# Patient Record
Sex: Male | Born: 1945 | Race: Black or African American | Hispanic: No | Marital: Married | Smoking: Never smoker
Health system: Southern US, Community
[De-identification: ages and names within clinical notes are randomized; demographics above are authoritative.]

## PROBLEM LIST (undated history)

## (undated) DIAGNOSIS — K279 Peptic ulcer, site unspecified, unspecified as acute or chronic, without hemorrhage or perforation: Secondary | ICD-10-CM

## (undated) DIAGNOSIS — I10 Essential (primary) hypertension: Secondary | ICD-10-CM

## (undated) DIAGNOSIS — H40009 Preglaucoma, unspecified, unspecified eye: Secondary | ICD-10-CM

## (undated) DIAGNOSIS — R972 Elevated prostate specific antigen [PSA]: Secondary | ICD-10-CM

## (undated) DIAGNOSIS — E119 Type 2 diabetes mellitus without complications: Secondary | ICD-10-CM

## (undated) DIAGNOSIS — I4891 Unspecified atrial fibrillation: Secondary | ICD-10-CM

## (undated) DIAGNOSIS — K635 Polyp of colon: Secondary | ICD-10-CM

## (undated) DIAGNOSIS — H3530 Unspecified macular degeneration: Secondary | ICD-10-CM

## (undated) DIAGNOSIS — I639 Cerebral infarction, unspecified: Secondary | ICD-10-CM

## (undated) DIAGNOSIS — E785 Hyperlipidemia, unspecified: Secondary | ICD-10-CM

## (undated) DIAGNOSIS — N400 Enlarged prostate without lower urinary tract symptoms: Secondary | ICD-10-CM

## (undated) HISTORY — DX: Unspecified atrial fibrillation: I48.91

## (undated) HISTORY — DX: Hyperlipidemia, unspecified: E78.5

## (undated) HISTORY — PX: ABCESS DRAINAGE: SHX399

## (undated) HISTORY — DX: Benign prostatic hyperplasia without lower urinary tract symptoms: N40.0

## (undated) HISTORY — PX: PROSTATE BIOPSY: SHX241

## (undated) HISTORY — DX: Polyp of colon: K63.5

## (undated) HISTORY — DX: Elevated prostate specific antigen (PSA): R97.20

## (undated) HISTORY — DX: Essential (primary) hypertension: I10

## (undated) HISTORY — DX: Peptic ulcer, site unspecified, unspecified as acute or chronic, without hemorrhage or perforation: K27.9

## (undated) HISTORY — PX: HERNIA REPAIR: SHX51

## (undated) HISTORY — PX: POLYPECTOMY: SHX149

## (undated) HISTORY — DX: Type 2 diabetes mellitus without complications: E11.9

## (undated) HISTORY — DX: Unspecified macular degeneration: H35.30

## (undated) HISTORY — DX: Cerebral infarction, unspecified: I63.9

## (undated) HISTORY — DX: Preglaucoma, unspecified, unspecified eye: H40.009

---

## 1999-03-08 ENCOUNTER — Ambulatory Visit (HOSPITAL_BASED_OUTPATIENT_CLINIC_OR_DEPARTMENT_OTHER): Admission: RE | Admit: 1999-03-08 | Discharge: 1999-03-08 | Payer: Self-pay | Admitting: Orthopedic Surgery

## 1999-07-22 ENCOUNTER — Encounter: Payer: Self-pay | Admitting: Internal Medicine

## 1999-07-22 ENCOUNTER — Ambulatory Visit (HOSPITAL_COMMUNITY): Admission: RE | Admit: 1999-07-22 | Discharge: 1999-07-22 | Payer: Self-pay | Admitting: Internal Medicine

## 1999-11-12 ENCOUNTER — Encounter: Payer: Self-pay | Admitting: Urology

## 1999-11-12 ENCOUNTER — Ambulatory Visit (HOSPITAL_COMMUNITY): Admission: RE | Admit: 1999-11-12 | Discharge: 1999-11-12 | Payer: Self-pay | Admitting: Urology

## 2005-03-04 ENCOUNTER — Ambulatory Visit: Payer: Self-pay | Admitting: Internal Medicine

## 2005-03-11 ENCOUNTER — Ambulatory Visit: Payer: Self-pay | Admitting: Internal Medicine

## 2006-08-27 ENCOUNTER — Ambulatory Visit: Payer: Self-pay | Admitting: Internal Medicine

## 2006-08-27 LAB — CONVERTED CEMR LAB
ALT: 23 units/L (ref 0–40)
AST: 21 units/L (ref 0–37)
Albumin: 3.7 g/dL (ref 3.5–5.2)
Alkaline Phosphatase: 76 units/L (ref 39–117)
BUN: 16 mg/dL (ref 6–23)
Basophils Absolute: 0 10*3/uL (ref 0.0–0.1)
Basophils Relative: 0.2 % (ref 0.0–1.0)
Bilirubin Urine: NEGATIVE
Bilirubin, Direct: 0.2 mg/dL (ref 0.0–0.3)
CO2: 28 meq/L (ref 19–32)
Calcium: 9.3 mg/dL (ref 8.4–10.5)
Chloride: 105 meq/L (ref 96–112)
Cholesterol: 152 mg/dL (ref 0–200)
Creatinine, Ser: 0.3 mg/dL — ABNORMAL LOW (ref 0.4–1.5)
Eosinophils Absolute: 0.1 10*3/uL (ref 0.0–0.6)
Eosinophils Relative: 2 % (ref 0.0–5.0)
GFR calc Af Amer: 391 mL/min
GFR calc non Af Amer: 323 mL/min
Glucose, Bld: 120 mg/dL — ABNORMAL HIGH (ref 70–99)
HCT: 35.5 % — ABNORMAL LOW (ref 39.0–52.0)
HDL: 36 mg/dL — ABNORMAL LOW (ref >39.0)
Hemoglobin, Urine: NEGATIVE
Hemoglobin: 12.1 g/dL — ABNORMAL LOW (ref 13.0–17.0)
Ketones, ur: NEGATIVE mg/dL
LDL Cholesterol: 100 mg/dL — ABNORMAL HIGH (ref 0–99)
Leukocytes, UA: NEGATIVE
Lymphocytes Relative: 32 % (ref 12.0–46.0)
MCHC: 34.1 g/dL (ref 30.0–36.0)
MCV: 85.7 fL (ref 78.0–100.0)
Monocytes Absolute: 0.4 10*3/uL (ref 0.2–0.7)
Monocytes Relative: 7.3 % (ref 3.0–11.0)
Neutro Abs: 3.2 10*3/uL (ref 1.4–7.7)
Neutrophils Relative %: 58.5 % (ref 43.0–77.0)
Nitrite: NEGATIVE
PSA: 2.07 ng/mL (ref 0.10–4.00)
Platelets: 249 10*3/uL (ref 150–400)
Potassium: 4 meq/L (ref 3.5–5.1)
RBC: 4.14 M/uL — ABNORMAL LOW (ref 4.22–5.81)
RDW: 12.4 % (ref 11.5–14.6)
Sodium: 141 meq/L (ref 135–145)
Specific Gravity, Urine: 1.025 (ref 1.000–1.03)
TSH: 0.67 microintl units/mL (ref 0.35–5.50)
Total Bilirubin: 1 mg/dL (ref 0.3–1.2)
Total CHOL/HDL Ratio: 4.2
Total Protein, Urine: NEGATIVE mg/dL
Total Protein: 7 g/dL (ref 6.0–8.3)
Triglycerides: 80 mg/dL (ref 0–149)
Urine Glucose: NEGATIVE mg/dL
Urobilinogen, UA: 0.2 (ref 0.0–1.0)
VLDL: 16 mg/dL (ref 0–40)
WBC: 5.4 10*3/uL (ref 4.5–10.5)
pH: 6 (ref 5.0–8.0)

## 2006-09-03 ENCOUNTER — Ambulatory Visit: Payer: Self-pay | Admitting: Internal Medicine

## 2007-09-20 ENCOUNTER — Ambulatory Visit: Payer: Self-pay | Admitting: Internal Medicine

## 2007-09-20 DIAGNOSIS — H8309 Labyrinthitis, unspecified ear: Secondary | ICD-10-CM | POA: Insufficient documentation

## 2007-09-20 DIAGNOSIS — I10 Essential (primary) hypertension: Secondary | ICD-10-CM | POA: Insufficient documentation

## 2007-09-22 ENCOUNTER — Telehealth: Payer: Self-pay | Admitting: Internal Medicine

## 2007-10-06 ENCOUNTER — Encounter (HOSPITAL_BASED_OUTPATIENT_CLINIC_OR_DEPARTMENT_OTHER): Admission: RE | Admit: 2007-10-06 | Discharge: 2007-10-25 | Payer: Self-pay | Admitting: Surgery

## 2008-09-26 ENCOUNTER — Encounter (INDEPENDENT_AMBULATORY_CARE_PROVIDER_SITE_OTHER): Payer: Self-pay | Admitting: *Deleted

## 2008-12-14 ENCOUNTER — Ambulatory Visit: Payer: Self-pay | Admitting: Internal Medicine

## 2008-12-14 LAB — CONVERTED CEMR LAB
ALT: 30 units/L (ref 0–53)
AST: 26 units/L (ref 0–37)
Albumin: 3.8 g/dL (ref 3.5–5.2)
Alkaline Phosphatase: 65 units/L (ref 39–117)
BUN: 15 mg/dL (ref 6–23)
Basophils Absolute: 0.1 10*3/uL (ref 0.0–0.1)
Basophils Relative: 1.1 % (ref 0.0–3.0)
Bilirubin Urine: NEGATIVE
Bilirubin, Direct: 0.2 mg/dL (ref 0.0–0.3)
CO2: 28 meq/L (ref 19–32)
Calcium: 8.8 mg/dL (ref 8.4–10.5)
Chloride: 106 meq/L (ref 96–112)
Cholesterol: 132 mg/dL (ref 0–200)
Creatinine, Ser: 0.9 mg/dL (ref 0.4–1.5)
Eosinophils Absolute: 0.1 10*3/uL (ref 0.0–0.7)
Eosinophils Relative: 2.1 % (ref 0.0–5.0)
GFR calc non Af Amer: 109.15 mL/min (ref >60)
Glucose, Bld: 101 mg/dL — ABNORMAL HIGH (ref 70–99)
HCT: 42.2 % (ref 39.0–52.0)
HDL: 37.5 mg/dL — ABNORMAL LOW (ref >39.00)
Hemoglobin, Urine: NEGATIVE
Hemoglobin: 14.1 g/dL (ref 13.0–17.0)
Ketones, ur: NEGATIVE mg/dL
LDL Cholesterol: 80 mg/dL (ref 0–99)
Leukocytes, UA: NEGATIVE
Lymphocytes Relative: 34.8 % (ref 12.0–46.0)
Lymphs Abs: 2.3 10*3/uL (ref 0.7–4.0)
MCHC: 33.4 g/dL (ref 30.0–36.0)
MCV: 88.4 fL (ref 78.0–100.0)
Monocytes Absolute: 0.6 10*3/uL (ref 0.1–1.0)
Monocytes Relative: 8.7 % (ref 3.0–12.0)
Neutro Abs: 3.5 10*3/uL (ref 1.4–7.7)
Neutrophils Relative %: 53.3 % (ref 43.0–77.0)
Nitrite: NEGATIVE
PSA: 2.57 ng/mL (ref 0.10–4.00)
Platelets: 263 10*3/uL (ref 150.0–400.0)
Potassium: 4.6 meq/L (ref 3.5–5.1)
RBC: 4.78 M/uL (ref 4.22–5.81)
RDW: 12.9 % (ref 11.5–14.6)
Sodium: 143 meq/L (ref 135–145)
Specific Gravity, Urine: 1.02 (ref 1.000–1.030)
TSH: 0.57 microintl units/mL (ref 0.35–5.50)
Total Bilirubin: 1 mg/dL (ref 0.3–1.2)
Total CHOL/HDL Ratio: 4
Total Protein, Urine: NEGATIVE mg/dL
Total Protein: 6.7 g/dL (ref 6.0–8.3)
Triglycerides: 71 mg/dL (ref 0.0–149.0)
Urine Glucose: NEGATIVE mg/dL
Urobilinogen, UA: 0.2 (ref 0.0–1.0)
VLDL: 14.2 mg/dL (ref 0.0–40.0)
WBC: 6.6 10*3/uL (ref 4.5–10.5)
pH: 5.5 (ref 5.0–8.0)

## 2008-12-19 ENCOUNTER — Ambulatory Visit: Payer: Self-pay | Admitting: Internal Medicine

## 2009-05-24 ENCOUNTER — Telehealth: Payer: Self-pay | Admitting: Internal Medicine

## 2009-10-09 ENCOUNTER — Telehealth (INDEPENDENT_AMBULATORY_CARE_PROVIDER_SITE_OTHER): Payer: Self-pay | Admitting: *Deleted

## 2010-01-01 ENCOUNTER — Ambulatory Visit: Payer: Self-pay | Admitting: Internal Medicine

## 2010-01-01 LAB — CONVERTED CEMR LAB
ALT: 25 units/L (ref 0–53)
AST: 20 units/L (ref 0–37)
Albumin: 3.6 g/dL (ref 3.5–5.2)
Alkaline Phosphatase: 70 units/L (ref 39–117)
BUN: 14 mg/dL (ref 6–23)
Basophils Absolute: 0 10*3/uL (ref 0.0–0.1)
Basophils Relative: 0.2 % (ref 0.0–3.0)
Bilirubin Urine: NEGATIVE
Bilirubin, Direct: 0.2 mg/dL (ref 0.0–0.3)
CO2: 28 meq/L (ref 19–32)
Calcium: 8.8 mg/dL (ref 8.4–10.5)
Chloride: 107 meq/L (ref 96–112)
Cholesterol: 157 mg/dL (ref 0–200)
Creatinine, Ser: 0.8 mg/dL (ref 0.4–1.5)
Eosinophils Absolute: 0.1 10*3/uL (ref 0.0–0.7)
Eosinophils Relative: 0.7 % (ref 0.0–5.0)
GFR calc non Af Amer: 134.26 mL/min (ref >60)
Glucose, Bld: 107 mg/dL — ABNORMAL HIGH (ref 70–99)
HCT: 43.6 % (ref 39.0–52.0)
HDL: 47.6 mg/dL (ref >39.00)
Hemoglobin, Urine: NEGATIVE
Hemoglobin: 14.3 g/dL (ref 13.0–17.0)
Ketones, ur: NEGATIVE mg/dL
LDL Cholesterol: 97 mg/dL (ref 0–99)
Lymphocytes Relative: 11.2 % — ABNORMAL LOW (ref 12.0–46.0)
Lymphs Abs: 1.5 10*3/uL (ref 0.7–4.0)
MCHC: 32.8 g/dL (ref 30.0–36.0)
MCV: 89.4 fL (ref 78.0–100.0)
Monocytes Absolute: 1.1 10*3/uL — ABNORMAL HIGH (ref 0.1–1.0)
Monocytes Relative: 8.4 % (ref 3.0–12.0)
Neutro Abs: 10.6 10*3/uL — ABNORMAL HIGH (ref 1.4–7.7)
Neutrophils Relative %: 79.5 % — ABNORMAL HIGH (ref 43.0–77.0)
Nitrite: NEGATIVE
PSA: 3.81 ng/mL (ref 0.10–4.00)
Platelets: 252 10*3/uL (ref 150.0–400.0)
Potassium: 4.2 meq/L (ref 3.5–5.1)
RBC: 4.88 M/uL (ref 4.22–5.81)
RDW: 14.1 % (ref 11.5–14.6)
Sodium: 140 meq/L (ref 135–145)
Specific Gravity, Urine: 1.025 (ref 1.000–1.030)
TSH: 0.81 microintl units/mL (ref 0.35–5.50)
Total Bilirubin: 0.9 mg/dL (ref 0.3–1.2)
Total CHOL/HDL Ratio: 3
Total Protein, Urine: NEGATIVE mg/dL
Total Protein: 6.7 g/dL (ref 6.0–8.3)
Triglycerides: 61 mg/dL (ref 0.0–149.0)
Urine Glucose: NEGATIVE mg/dL
Urobilinogen, UA: 0.2 (ref 0.0–1.0)
VLDL: 12.2 mg/dL (ref 0.0–40.0)
WBC: 13.3 10*3/uL — ABNORMAL HIGH (ref 4.5–10.5)
pH: 6 (ref 5.0–8.0)

## 2010-01-03 ENCOUNTER — Ambulatory Visit: Payer: Self-pay | Admitting: Internal Medicine

## 2010-01-03 ENCOUNTER — Inpatient Hospital Stay (HOSPITAL_COMMUNITY): Admission: EM | Admit: 2010-01-03 | Discharge: 2010-01-06 | Payer: Self-pay | Admitting: Emergency Medicine

## 2010-01-03 DIAGNOSIS — E785 Hyperlipidemia, unspecified: Secondary | ICD-10-CM | POA: Insufficient documentation

## 2010-01-14 ENCOUNTER — Encounter: Payer: Self-pay | Admitting: Internal Medicine

## 2010-02-05 ENCOUNTER — Encounter: Payer: Self-pay | Admitting: Internal Medicine

## 2010-07-23 NOTE — Progress Notes (Signed)
Summary: Records request from [facility]  Request for records received from [facility]. Request forwarded to [facility]. Dena Chavis  October 09, 2009 10:37 AM

## 2010-07-23 NOTE — Letter (Signed)
Summary: [facility]   Imported By: Sherian Rein 02/26/2010 10:52:52  _____________________________________________________________________  External Attachment:    Type:   Image     Comment:   External Document
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
## 2010-07-23 NOTE — Letter (Signed)
Summary: [facility]   Imported By: Lester Dawn 02/05/2010 08:36:15  _____________________________________________________________________  External Attachment:    Type:   Image     Comment:   External Document

## 2010-09-07 LAB — CBC
HCT: 38.7 % — ABNORMAL LOW (ref 39.0–52.0)
Hemoglobin: 13.1 g/dL (ref 13.0–17.0)
MCH: 29.6 pg (ref 26.0–34.0)
MCHC: 33.8 g/dL (ref 30.0–36.0)
MCV: 87.6 fL (ref 78.0–100.0)
Platelets: 305 10*3/uL (ref 150–400)
RBC: 4.42 MIL/uL (ref 4.22–5.81)
RDW: 13.6 % (ref 11.5–15.5)
WBC: 15.9 10*3/uL — ABNORMAL HIGH (ref 4.0–10.5)

## 2010-09-07 LAB — BASIC METABOLIC PANEL
BUN: 11 mg/dL (ref 6–23)
CO2: 28 mEq/L (ref 19–32)
Calcium: 8.4 mg/dL (ref 8.4–10.5)
Chloride: 106 mEq/L (ref 96–112)
Creatinine, Ser: 0.95 mg/dL (ref 0.4–1.5)
GFR calc Af Amer: >60 mL/min (ref >60)
GFR calc non Af Amer: >60 mL/min (ref >60)
Glucose, Bld: 136 mg/dL — ABNORMAL HIGH (ref 70–99)
Potassium: 4 mEq/L (ref 3.5–5.1)
Sodium: 139 mEq/L (ref 135–145)

## 2010-09-08 LAB — URINALYSIS, ROUTINE W REFLEX MICROSCOPIC
Bilirubin Urine: NEGATIVE
Glucose, UA: NEGATIVE mg/dL
Hgb urine dipstick: NEGATIVE
Ketones, ur: NEGATIVE mg/dL
Nitrite: NEGATIVE
Protein, ur: NEGATIVE mg/dL
Specific Gravity, Urine: 1.022 (ref 1.005–1.030)
Urobilinogen, UA: 0.2 mg/dL (ref 0.0–1.0)
pH: 5 (ref 5.0–8.0)

## 2010-09-08 LAB — DIFFERENTIAL
Basophils Absolute: 0 10*3/uL (ref 0.0–0.1)
Basophils Relative: 0 % (ref 0–1)
Eosinophils Absolute: 0.1 10*3/uL (ref 0.0–0.7)
Eosinophils Relative: 1 % (ref 0–5)
Lymphocytes Relative: 16 % (ref 12–46)
Lymphs Abs: 1.6 10*3/uL (ref 0.7–4.0)
Monocytes Absolute: 0.7 10*3/uL (ref 0.1–1.0)
Monocytes Relative: 7 % (ref 3–12)
Neutro Abs: 7.7 10*3/uL (ref 1.7–7.7)
Neutrophils Relative %: 76 % (ref 43–77)

## 2010-09-08 LAB — CBC
HCT: 44.5 % (ref 39.0–52.0)
Hemoglobin: 15.2 g/dL (ref 13.0–17.0)
MCH: 29.8 pg (ref 26.0–34.0)
MCHC: 34.1 g/dL (ref 30.0–36.0)
MCV: 87.3 fL (ref 78.0–100.0)
Platelets: 314 10*3/uL (ref 150–400)
RBC: 5.1 MIL/uL (ref 4.22–5.81)
RDW: 13.6 % (ref 11.5–15.5)
WBC: 10.1 10*3/uL (ref 4.0–10.5)

## 2010-09-08 LAB — BASIC METABOLIC PANEL WITH GFR
BUN: 17 mg/dL (ref 6–23)
CO2: 26 meq/L (ref 19–32)
Calcium: 9.2 mg/dL (ref 8.4–10.5)
Chloride: 105 meq/L (ref 96–112)
Creatinine, Ser: 0.91 mg/dL (ref 0.4–1.5)
GFR calc Af Amer: >60 mL/min (ref >60)
GFR calc non Af Amer: >60 mL/min (ref >60)
Glucose, Bld: 111 mg/dL — ABNORMAL HIGH (ref 70–99)
Potassium: 3.8 meq/L (ref 3.5–5.1)
Sodium: 139 meq/L (ref 135–145)

## 2010-10-01 ENCOUNTER — Other Ambulatory Visit: Payer: Self-pay | Admitting: Internal Medicine
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
## 2010-11-08 NOTE — Assessment & Plan Note (Signed)
[facility]                           PRIMARY CARE OFFICE NOTE   MANAN, OLMO                      MRN:          [id number]  DATE:09/03/2006                            DOB:          Jan 02, 1946    [name] is a 65 year old [place] American gentleman presents for  followup evaluation and exam.  He was last seen in the office on  March 11, 2005.  In the interval, the patient reports he is actually  feeling well and doing well.   PAST MEDICAL HISTORY:   SURGICAL:  1. Abdominal abscess surgically drained.  2. Neck lesion excised and drained.  3. Adult circumcision.   MEDICAL ILLNESSES:  1. Chicken Pox as a child.  2. GC in the past.  3. Hyperlipidemia.  4. Hypertension.  5. 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  6. The patient had a painful ganglion cyst removed from A2 pulley left      small finger.   CURRENT MEDICATIONS:  1. Lipitor 20 mg daily.  2. Aspirin 325 mg daily.  3. Ziac 2.5/6.25, one daily.  4. Viagra 50 mg p.r.n.   FAMILY HISTORY:  Father died age 28 of MI.  Brother with CAD.  Sister  with cancer.  Positive family history for diabetes and heart disease.   SOCIAL HISTORY:  The patient has been married for 38 years.  He has two  daughters.  He had a son who has passed.  He has four grandchildren, 3  girls, 1 boy.  The patient continues to work at [employer] with 30+ years  of service.  He reports his marriage is in good health.   CHART REVIEW:  1. Last colonoscopy Oct 30, 2003, which was unremarkable with followup      in 2010.  2. The patient had a 2D echo in 1997, which was normal with an EF of      60%.  3. The patient had a painful ganglion cyst removed from A2 pulley left      small finger.  4. The patient had an MRI of the lumbar spine on July 22, 1999,      which showed focal disk protrusion at L5-L6 central and to the left      without definite nerve root compression.   REVIEW OF SYSTEMS:   The patient has had a 7-pound weight loss in 18  months.  It has been more than two years since he has had an eye exam.  No ENT complaints.  No cardiovascular complaints or respiratory  complaints.  The patient has occasional heartburn treated with Tums.  The patient has mild prostatism with decreased force of stream and slow  to start with nocturia, 0-1.  MSK:  Significant for ongoing pain in his  left shoulder with aches and discomfort but actually good range of  motion.  The patient is complaining of burning in his left leg at night  which does disrupt his sleep.  He also complains of some weakness in  both hands with grip strength with actually loss of strength after  prolonged holding.  PHYSICAL EXAMINATION:  VITAL SIGNS:  Temperature was 96.9, blood  pressure 138/83, pulse 69, weight 201.  GENERAL APPEARANCE:  A well nourished, athletic-appearing gentleman  looking younger than his stated chronologic age in no acute distress.  HEENT:  Normocephalic atraumatic.  EACs and TMs were unremarkable.  Oropharynx revealed the patient to be missing several teeth.  No buccal  lesions were noted.  His palate was normal.  Posterior pharynx was  clear.  Conjunctivae and sclerae were clear.  PERRLA.  EOMI.  Funduscopic exam was unremarkable.  NECK:  Supple without thyromegaly.  NODES:  No adenopathy was noted in the cervical, supraclavicular  regions.  CHEST:  No CVA tenderness.  LUNGS:  Clear to auscultation and percussion.  CARDIOVASCULAR:  With 2+ peripheral pulses, no JVD or carotid bruits.  He he denies a quiet precordium with a regular rate and rhythm without  murmurs, rubs, or gallops.  ABDOMEN:  Soft, no guarding or rebound, no organomegaly or splenomegaly  was noted.  He does have an umbilical hernia which is soft and easily  reducible.  GENITALIA:  Normal male phallus.  Bilaterally descended testicles  without masses.  RECTAL:  Revealed normal sphincter tone.  He has a 2+ to 3+  prostate  that is smooth with no nodules or abnormalities.  EXTREMITIES:  Without clubbing, cyanosis, edema, or deformity.  NEUROLOGIC:  Nonfocal.   LABORATORY:  From August 27, 2006, hemoglobin 12.1 grams, white count was  5,400 with a normal differential.  Chemistries revealed a glucose of  120.  Electrolytes were normal.  Kidney function normal with a  creatinine of 0.3.  A GFR of 391.  Liver functions were normal.  Cholesterol 152, triglycerides were 80, HDL was 36, LDL 100.  TSH normal  at 0.67.  PSA was normal at 2.07, down from 2.29 last year.  Urinalysis  was negative.   ASSESSMENT/PLAN:  1. Hypertension.  The patient with excellent control of his blood      pressure.  He will continue his present medications and refill      prescriptions, forwarded electronically.  2. Lipids.  The patient has had a good response to Lipitor with an LDL      at goal.  He will continue on his present medications and      prescriptions filed electronically.  3. Hyperglycemia.  Reviewing the patient's chart, he has had a      progressively elevated glucose, going from normal to 116, to 127,      now at 120.  I discussed this with the patient as early diabetes.      He is provided with the care notes handout on adult onset      diabetes as well as a carbohydrate counting diet.  Plan, the      patient is to work on diet management in regards to bringing his      blood sugar under better control.  Would get followup lab in one      year.  4. Peripheral neuropathy.  The patient is having neuropathic type pain      in his left lower extremity at night.  Plan is a trial of Elavil 25      mg at night, prescription is provided.  5. Health maintenance.  The patient is currently up to date with      colorectal cancer screening and prostate evaluation.  The patient      did have a 12-lead electrocardiogram which revealed the patient  to     have a normal sinus rhythm, question of minimal voltage criteria in       the lateral leads for LVH but otherwise unremarkable.   In summary, this is a very pleasant gentleman who is medically stable.  We did review blood sugar in detail.  I do want him to follow the diet  closely to bring this under tight control.     Rosalyn Gess Norins, MD  Electronically Signed    MEN/MedQ  DD: 09/03/2006  DT: 09/04/2006  Job #: [id number]   cc:   Marlowe Kays, Mr.

## 2010-11-29 ENCOUNTER — Other Ambulatory Visit: Payer: Self-pay | Admitting: Internal Medicine

## 2011-01-03 ENCOUNTER — Other Ambulatory Visit: Payer: Self-pay

## 2011-01-03 ENCOUNTER — Other Ambulatory Visit: Payer: Self-pay | Admitting: Internal Medicine

## 2011-01-03 DIAGNOSIS — Z0389 Encounter for observation for other suspected diseases and conditions ruled out: Secondary | ICD-10-CM

## 2011-01-03 DIAGNOSIS — Z0000 Encounter for general adult medical examination without abnormal findings: Secondary | ICD-10-CM

## 2011-01-06 ENCOUNTER — Encounter: Payer: Self-pay | Admitting: Internal Medicine

## 2011-01-17 ENCOUNTER — Other Ambulatory Visit (INDEPENDENT_AMBULATORY_CARE_PROVIDER_SITE_OTHER): Payer: Self-pay

## 2011-01-17 DIAGNOSIS — Z0389 Encounter for observation for other suspected diseases and conditions ruled out: Secondary | ICD-10-CM

## 2011-01-17 DIAGNOSIS — Z0000 Encounter for general adult medical examination without abnormal findings: Secondary | ICD-10-CM

## 2011-01-17 LAB — HEPATIC FUNCTION PANEL
ALT: 38 U/L (ref 0–53)
AST: 31 U/L (ref 0–37)
Albumin: 4.2 g/dL (ref 3.5–5.2)
Alkaline Phosphatase: 89 U/L (ref 39–117)
Bilirubin, Direct: 0.2 mg/dL (ref 0.0–0.3)
Total Bilirubin: 0.8 mg/dL (ref 0.3–1.2)
Total Protein: 7.3 g/dL (ref 6.0–8.3)

## 2011-01-17 LAB — CBC WITH DIFFERENTIAL/PLATELET
Basophils Absolute: 0 K/uL (ref 0.0–0.1)
Basophils Relative: 0.4 % (ref 0.0–3.0)
Eosinophils Absolute: 0.2 K/uL (ref 0.0–0.7)
Eosinophils Relative: 2.3 % (ref 0.0–5.0)
HCT: 44.7 % (ref 39.0–52.0)
Hemoglobin: 14.6 g/dL (ref 13.0–17.0)
Lymphocytes Relative: 31.3 % (ref 12.0–46.0)
Lymphs Abs: 2.2 K/uL (ref 0.7–4.0)
MCHC: 32.7 g/dL (ref 30.0–36.0)
MCV: 87.1 fl (ref 78.0–100.0)
Monocytes Absolute: 0.6 K/uL (ref 0.1–1.0)
Monocytes Relative: 9.1 % (ref 3.0–12.0)
Neutro Abs: 4 K/uL (ref 1.4–7.7)
Neutrophils Relative %: 56.9 % (ref 43.0–77.0)
Platelets: 258 K/uL (ref 150.0–400.0)
RBC: 5.13 Mil/uL (ref 4.22–5.81)
RDW: 13.8 % (ref 11.5–14.6)
WBC: 7.1 K/uL (ref 4.5–10.5)

## 2011-01-17 LAB — TSH: TSH: 0.88 u[IU]/mL (ref 0.35–5.50)

## 2011-01-17 LAB — URINALYSIS
Bilirubin Urine: NEGATIVE
Hgb urine dipstick: NEGATIVE
Ketones, ur: NEGATIVE
Leukocytes, UA: NEGATIVE
Nitrite: NEGATIVE
Specific Gravity, Urine: 1.015 (ref 1.000–1.030)
Total Protein, Urine: NEGATIVE
Urine Glucose: NEGATIVE
Urobilinogen, UA: 0.2 (ref 0.0–1.0)
pH: 5.5 (ref 5.0–8.0)

## 2011-01-17 LAB — LIPID PANEL
Cholesterol: 160 mg/dL (ref 0–200)
HDL: 40.8 mg/dL (ref >39.00)
LDL Cholesterol: 100 mg/dL — ABNORMAL HIGH (ref 0–99)
Total CHOL/HDL Ratio: 4
Triglycerides: 96 mg/dL (ref 0.0–149.0)
VLDL: 19.2 mg/dL (ref 0.0–40.0)

## 2011-01-17 LAB — BASIC METABOLIC PANEL
BUN: 16 mg/dL (ref 6–23)
CO2: 26 mEq/L (ref 19–32)
Calcium: 9 mg/dL (ref 8.4–10.5)
Chloride: 106 mEq/L (ref 96–112)
Creatinine, Ser: 0.8 mg/dL (ref 0.4–1.5)
GFR: 120.74 mL/min (ref >60.00)
Glucose, Bld: 134 mg/dL — ABNORMAL HIGH (ref 70–99)
Potassium: 4.5 mEq/L (ref 3.5–5.1)
Sodium: 141 mEq/L (ref 135–145)

## 2011-01-17 LAB — PSA: PSA: 3.14 ng/mL (ref 0.10–4.00)

## 2011-01-20 ENCOUNTER — Encounter: Payer: Self-pay | Admitting: Internal Medicine

## 2011-01-22 ENCOUNTER — Ambulatory Visit (INDEPENDENT_AMBULATORY_CARE_PROVIDER_SITE_OTHER): Payer: BC Managed Care – PPO | Admitting: Internal Medicine

## 2011-01-22 ENCOUNTER — Encounter: Payer: Self-pay | Admitting: Internal Medicine

## 2011-01-22 VITALS — BP 110/84 | HR 67 | Temp 97.9°F | Ht 71.0 in | Wt 209.0 lb

## 2011-01-22 DIAGNOSIS — I10 Essential (primary) hypertension: Secondary | ICD-10-CM

## 2011-01-22 DIAGNOSIS — Z0000 Encounter for general adult medical examination without abnormal findings: Secondary | ICD-10-CM

## 2011-01-22 DIAGNOSIS — Z136 Encounter for screening for cardiovascular disorders: Secondary | ICD-10-CM

## 2011-01-22 DIAGNOSIS — E785 Hyperlipidemia, unspecified: Secondary | ICD-10-CM

## 2011-01-22 MED ORDER — SILDENAFIL CITRATE 100 MG PO TABS: 100.0000 mg | ORAL_TABLET | Freq: Every day | ORAL | Status: DC | PRN

## 2011-01-22 MED ORDER — ATORVASTATIN CALCIUM 20 MG PO TABS: 20.0000 mg | ORAL_TABLET | Freq: Every day | ORAL | Status: DC

## 2011-01-22 MED ORDER — BISOPROLOL-HYDROCHLOROTHIAZIDE 5-6.25 MG PO TABS: 1.0000 | ORAL_TABLET | Freq: Every day | ORAL | Status: DC

## 2011-01-22 NOTE — Progress Notes (Signed)
Subjective:    Patient ID: Marcus Beasley, male    DOB: 1945/10/22, 65 y.o.   MRN: [id number]  HPI  The patient is here for annual Medicare wellness examination and management of other chronic and acute problems. Feeling pretty good.      The risk factors are reflected in the social history.  The roster of all physicians providing medical care to patient - is listed in the Snapshot section of the chart.  Activities of daily living:  The patient is 100% inedpendent in all ADLs: dressing, toileting, feeding as well as independent mobility  Home safety : The patient has smoke detectors in the home. They wear seatbelts. No firearms at home. There is no violence in the home. No falls.  There is no risks for hepatitis, STDs or HIV. There is no   history of blood transfusion. They have no travel history to infectious disease endemic areas of the world.  The patient has seen their dentist in the last six month. They have not seen their eye doctor in the last year. They deny  any hearing difficulty and have not had audiologic testing in the last year.  They do not  have excessive sun exposure. Discussed the need for sun protection: hats, long sleeves and use of sunscreen if there is significant sun exposure.   Diet: the importance of a healthy diet is discussed. They do not  have a healthy die-frequent fast food meals.  The patient does not have a regular exercise program.  The benefits of regular aerobic exercise were discussed.  Depression screen: there are no signs or vegative symptoms of depression- irritability, change in appetite, anhedonia, sadness/tearfullness.  Cognitive assessment: the patient manages all their financial and personal affairs and is actively engaged. They could relate day,date,year and events; recalled 2/3 objects at 3 minutes; performed clock-face test normally.  The following portions of the patient's history were reviewed and updated as appropriate: allergies, current  medications, past family history, past medical history,  past surgical history, past social history  and problem list.  Vision, hearing, body mass index were assessed and reviewed.   During the course of the visit the patient was educated and counseled about appropriate screening and preventive services including : fall prevention , diabetes screening, nutrition counseling, colorectal cancer screening, and recommended immunizations.  Review of Systems  Constitutional:  Negative for fever, chills, activity change and unexpected weight change.  HEENT:  Negative for hearing loss, ear pain, congestion, neck stiffness and postnasal drip. Negative for sore throat or swallowing problems. Negative for dental complaints.   Eyes: Negative for vision loss or change in visual acuity.  Respiratory: Negative for chest tightness and wheezing.   Cardiovascular: Negative for chest pain and palpitation. No decreased exercise tolerance Gastrointestinal: No change in bowel habit. No bloating or gas. No reflux or indigestion on a regular basis. Genitourinary: Negative for urgency, frequency, flank pain and difficulty urinating.  Musculoskeletal: Negative for myalgias, back pain, arthralgias and gait problem. Except for left shoulder pain intermittently Neurological: Negative for dizziness, tremors, weakness and headaches.  Hematological: Negative for adenopathy.  Psychiatric/Behavioral: Negative for behavioral problems and dysphoric mood.   Lab Results  Component Value Date   WBC 7.1 01/17/2011   HGB 14.6 01/17/2011   HCT 44.7 01/17/2011   PLT 258.0 01/17/2011   CHOL 160 01/17/2011   TRIG 96.0 01/17/2011   HDL 40.80 01/17/2011   ALT 38 01/17/2011   AST 31 01/17/2011   NA 141 01/17/2011  K 4.5 01/17/2011   CL 106 01/17/2011   CREATININE 0.8 01/17/2011   BUN 16 01/17/2011   CO2 26 01/17/2011   TSH 0.88 01/17/2011   PSA 3.14 01/17/2011   Lab Results  Component Value Date   LDLCALC 100* 01/17/2011          Review of Systems     Objective:   Physical Exam Vital signs reviewed Gen'l: Well nourished well developed AA male in no acute distress  HEENT:  Head: Normocephalic and atraumatic.  Right Ear: External ear normal. EAC/TM nl Left Ear: External ear normal.  EAC/TM nl Nose: Nose normal.  Mouth/Throat: Oropharynx is clear and moist. Dentition - native, in good repair. No buccal or palatal lesions. Posterior pharynx clear. Eyes: Conjunctivae and sclera clear. EOM intact. Pupils are equal, round, and reactive to light. Right eye exhibits no discharge. Left eye exhibits no discharge. Neck: Normal range of motion. Neck supple. No JVD present. No tracheal deviation present. No thyromegaly present.  Cardiovascular: Normal rate, regular rhythm, no gallop, no friction rub, no murmur heard.      Quiet precordium. 2+ radial and DP pulses . No carotid bruits Pulmonary/Chest: Effort normal. No respiratory distress or increased WOB, no wheezes, no rales. No chest wall deformity or CVAT. Abdominal: Soft. Bowel sounds are normal in all quadrants. He exhibits no distension, no tenderness, no rebound or guarding, No heptosplenomegaly  Genitourinary:  deferred Musculoskeletal: Normal range of motion. He exhibits no edema and no tenderness.       Small and large joints without redness, synovial thickening or deformity. Full range of motion preserved about all small, median and large joints.  Lymphadenopathy:    He has no cervical or supraclavicular adenopathy.  Neurological: He is alert and oriented to person, place, and time. CN II-XII intact. DTRs 2+ and symmetrical biceps, radial and patellar tendons. Cerebellar function normal with no tremor, rigidity, normal gait and station.  Skin: Skin is warm and dry. No rash noted. No erythema.  Psychiatric: He has a normal mood and affect. His behavior is normal. Thought content normal.         Assessment & Plan:

## 2011-01-24 DIAGNOSIS — Z0000 Encounter for general adult medical examination without abnormal findings: Secondary | ICD-10-CM | POA: Insufficient documentation

## 2011-01-24 NOTE — Assessment & Plan Note (Signed)
Good control on present medical regimen.  Plan - continue meds  - no change

## 2011-01-24 NOTE — Assessment & Plan Note (Signed)
BP Readings from Last 3 Encounters:  01/22/11 110/84  01/03/10 122/70  12/19/08 130/84   Good control  Plan - continue present medications.

## 2011-01-25 NOTE — Assessment & Plan Note (Signed)
Unremarkable history. Physical exam is normal. Lab results are in normal limits. He is current with colorectal cancer screeenig with last study in Aug '05. Immunizations: Tetanus Marhc '09, Pneumonia vaccine July '11. He is a candidate for shingles vaccine. 12 lead EKG without evidence of ischemia or injury. PSA is normal.  In summary - a very nice man who appears to be medically stable. He is encouraged to adopt a regular aerobic exercise program - 30 minutes of exericse that raises the heart rate to 180% of resting and to work on [employer] management by smart food choices, portion size control and exercise. Target weight is 190 lbs. Goal is to loose

## 2011-05-01 ENCOUNTER — Telehealth: Payer: Self-pay | Admitting: *Deleted

## 2011-05-01 NOTE — Telephone Encounter (Signed)
Refill request from [facility] [place] for meclizine, please Advise

## 2011-05-02 MED ORDER — MECLIZINE HCL 25 MG PO TABS: ORAL_TABLET | ORAL | Status: DC

## 2011-05-02 NOTE — Telephone Encounter (Signed)
Called: spoke w/ wife - antivert called in.

## 2012-03-10 ENCOUNTER — Other Ambulatory Visit: Payer: Self-pay | Admitting: Internal Medicine

## 2012-04-15 ENCOUNTER — Encounter: Payer: Self-pay | Admitting: Internal Medicine

## 2012-04-15 ENCOUNTER — Ambulatory Visit (INDEPENDENT_AMBULATORY_CARE_PROVIDER_SITE_OTHER): Payer: BC Managed Care – PPO | Admitting: Internal Medicine

## 2012-04-15 ENCOUNTER — Other Ambulatory Visit (INDEPENDENT_AMBULATORY_CARE_PROVIDER_SITE_OTHER): Payer: BC Managed Care – PPO

## 2012-04-15 VITALS — BP 142/88 | HR 71 | Temp 97.6°F | Resp 16 | Wt 201.0 lb

## 2012-04-15 DIAGNOSIS — R972 Elevated prostate specific antigen [PSA]: Secondary | ICD-10-CM

## 2012-04-15 DIAGNOSIS — E785 Hyperlipidemia, unspecified: Secondary | ICD-10-CM

## 2012-04-15 DIAGNOSIS — Z0000 Encounter for general adult medical examination without abnormal findings: Secondary | ICD-10-CM

## 2012-04-15 DIAGNOSIS — I10 Essential (primary) hypertension: Secondary | ICD-10-CM

## 2012-04-15 MED ORDER — SILDENAFIL CITRATE 100 MG PO TABS: 100.0000 mg | ORAL_TABLET | Freq: Every day | ORAL | Status: DC | PRN

## 2012-04-15 MED ORDER — BISOPROLOL-HYDROCHLOROTHIAZIDE 5-6.25 MG PO TABS: 1.0000 | ORAL_TABLET | Freq: Every day | ORAL | Status: DC

## 2012-04-15 MED ORDER — MECLIZINE HCL 25 MG PO TABS: ORAL_TABLET | ORAL | Status: DC

## 2012-04-15 MED ORDER — ATORVASTATIN CALCIUM 20 MG PO TABS: 20.0000 mg | ORAL_TABLET | Freq: Every day | ORAL | Status: DC

## 2012-04-15 NOTE — Progress Notes (Signed)
Subjective:    Patient ID: Marcus Beasley, male    DOB: 1945-07-20, 66 y.o.   MRN: [id number]  HPI The patient is here for annual Medicare wellness examination and management of other chronic and acute problems.   The risk factors are reflected in the social history.  The roster of all physicians providing medical care to patient - is listed in the Snapshot section of the chart.  Activities of daily living:  The patient is 100% inedpendent in all ADLs: dressing, toileting, feeding as well as independent mobility  Home safety : The patient has smoke detectors in the home. Fall- no falls. Home is fall safe. They wear seatbelts. No firearms at home  There is no risks for hepatitis, STDs or HIV. There is no   history of blood transfusion. They have no travel history to infectious disease endemic areas of the world.  The patient has seen their dentist in the last six month. They have not seen their eye doctor in the last year. They deny (admit to) any hearing difficulty and have had audiologic testing in the last year.    They do not  have excessive sun exposure. Discussed the need for sun protection: hats, long sleeves and use of sunscreen if there is significant sun exposure.   Diet: the importance of a healthy diet is discussed. They do have a healthy diet.  The patient has a regular exercise program.  The benefits of regular aerobic exercise were discussed.  Depression screen: there are no signs or vegative symptoms of depression- irritability, change in appetite, anhedonia, sadness/tearfullness.  Cognitive assessment: the patient manages all their financial and personal affairs and is actively engaged.   Vision, hearing, body mass index were assessed and reviewed.   During the course of the visit the patient was educated and counseled about appropriate screening and preventive services including : fall prevention , diabetes screening, nutrition counseling, colorectal cancer screening,  and recommended immunizations.  Past Medical History  Diagnosis Date  . Labyrinthitis, unspecified   . Unspecified essential hypertension    Past Surgical History  Procedure Date  . Abcess drainage     abdomen- 26 day hospitalization 1968  . Hernia repair summer '11    umbilical   Family History  Problem Relation Age of Onset  . Leukemia Mother   . Coronary artery disease Father   . Heart attack Father   . Diabetes Maternal Grandmother   . Heart disease Sister   . Cancer Sister   . Heart disease Brother   . Kidney disease Brother     HD   History   Social History  . Marital Status: Married    Spouse Name: N/A    Number of Children: 3  . Years of Education: 14   Occupational History  . [employer] maintenance 1968    Social History Main Topics  . Smoking status: Never Smoker   . Smokeless tobacco: Never Used  . Alcohol Use: 0.5 oz/week    1 drink(s) per week     beer  . Drug Use: No  . Sexually Active: Yes -- Male partner(s)   Other Topics Concern  . 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 2 dtrs , 1 son - homicide. 5 grandchildren. 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    Current Outpatient Prescriptions on File Prior to Visit  Medication Sig Dispense Refill  . aspirin  325 MG tablet Take 325 mg by mouth daily.        Marland Kitchen atorvastatin (LIPITOR) 20 MG tablet TAKE 1 TABLET (20 MG TOTAL) BY MOUTH DAILY.  90 tablet  0  . bisoprolol-hydrochlorothiazide (ZIAC) 5-6.25 MG per tablet TAKE 1 TABLET BY MOUTH DAILY.  90 tablet  0  . diphenoxylate-atropine (LOMOTIL) 2.5-0.025 MG per tablet Take 1 tablet by mouth 4 (four) times daily as needed.        . meclizine (ANTIVERT) 25 MG tablet 1/2 or 1 tab every 6 hours for dizziness  90 tablet  1  . sildenafil (VIAGRA) 100 MG tablet Take 1 tablet (100 mg total) by mouth daily as needed.  5 tablet  4       Review of Systems Constitutional:  Negative for fever,  chills, activity change and unexpected weight change.  HEENT:  Negative for hearing loss, ear pain, congestion, neck stiffness and postnasal drip. Negative for sore throat or swallowing problems. Negative for dental complaints.   Eyes: Negative for vision loss or change in visual acuity.  Respiratory: Negative for chest tightness and wheezing. Negative for DOE.   Cardiovascular: Negative for chest pain or palpitations. No decreased exercise tolerance Gastrointestinal: No change in bowel habit. No bloating or gas. No reflux or indigestion Genitourinary: Negative for urgency, frequency, flank pain and difficulty urinating. ED Musculoskeletal: Negative for myalgias, back pain, arthralgias and gait problem.  Neurological: Negative for tremors, weakness and headaches. Occasional dizziness Hematological: Negative for adenopathy.  Psychiatric/Behavioral: Negative for behavioral problems and dysphoric mood.       Objective:   Physical Exam Filed Vitals:   04/15/12 1443  BP: 142/88  Pulse: 71  Temp: 97.6 F (36.4 C)  Resp: 16   Wt Readings from Last 3 Encounters:  04/15/12 201 lb (91.173 kg)  01/22/11 209 lb (94.802 kg)  01/03/10 197 lb (89.359 kg)   Gen'l: Well nourished well developed AA male in no acute distress  HEENT: Head: Normocephalic and atraumatic. Right Ear: External ear normal. EAC/TM nl. Left Ear: External ear normal.  EAC/TM nl. Nose: Nose normal. Mouth/Throat: Oropharynx is clear and moist. . No buccal or palatal lesions. Posterior pharynx clear. Eyes: Conjunctivae and sclera clear. EOM intact. Pupils are equal, round, and reactive to light. Right eye exhibits no discharge. Left eye exhibits no discharge. Neck: Normal range of motion. Neck supple. No JVD present. No tracheal deviation present. No thyromegaly present.  Cardiovascular: Normal rate, regular rhythm, no gallop, no friction rub, no murmur heard.      Quiet precordium. 2+ radial and DP pulses . No carotid  bruits Pulmonary/Chest: Effort normal. No respiratory distress or increased WOB, no wheezes, no rales. No chest wall deformity or CVAT. Abdomen: Soft. Bowel sounds are normal in all quadrants. He exhibits no distension, no tenderness, no rebound or guarding, No heptosplenomegaly. Palpable hernia defect supraumbilical region, non-tender  Genitourinary:  deferred Musculoskeletal: Normal range of motion. He exhibits no edema and no tenderness.       Small and large joints without redness, synovial thickening or deformity. Full range of motion preserved about all small, median and large joints.  Lymphadenopathy:    He has no cervical or supraclavicular adenopathy.  Neurological: He is alert and oriented to person, place, and time. CN II-XII intact. DTRs 2+ and symmetrical biceps, radial and patellar tendons. Cerebellar function normal with no tremor, rigidity, normal gait and station.  Skin: Skin is warm and dry. No rash noted. No erythema. Large skin  tag left lower back. Small keloid scars on anterior chest. Psychiatric: He has a normal mood and affect. His behavior is normal. Thought content normal.   Lab Results  Component Value Date   WBC 7.1 01/17/2011   HGB 14.6 01/17/2011   HCT 44.7 01/17/2011   PLT 258.0 01/17/2011   GLUCOSE 116* 04/15/2012   CHOL 167 04/15/2012   TRIG 88.0 04/15/2012   HDL 40.30 04/15/2012   LDLCALC 109* 04/15/2012   ALT 24 04/15/2012   ALT 24 04/15/2012   AST 26 04/15/2012   AST 26 04/15/2012   NA 139 04/15/2012   K 3.9 04/15/2012   CL 105 04/15/2012   CREATININE 1.0 04/15/2012   BUN 15 04/15/2012   CO2 27 04/15/2012   TSH 0.88 01/17/2011   PSA 4.49* 04/15/2012         Assessment & Plan:

## 2012-04-15 NOTE — Patient Instructions (Addendum)
Thanks for for coming to see me.  Watch the belly - there is a hernia defect at the belly-button - call if that pushes out or gets tender.  Full report to follow with the lab results

## 2012-04-16 LAB — LIPID PANEL
Cholesterol: 167 mg/dL (ref 0–200)
HDL: 40.3 mg/dL (ref >39.00)
LDL Cholesterol: 109 mg/dL — ABNORMAL HIGH (ref 0–99)
Total CHOL/HDL Ratio: 4
Triglycerides: 88 mg/dL (ref 0.0–149.0)
VLDL: 17.6 mg/dL (ref 0.0–40.0)

## 2012-04-16 LAB — COMPREHENSIVE METABOLIC PANEL
ALT: 24 U/L (ref 0–53)
AST: 26 U/L (ref 0–37)
Albumin: 3.9 g/dL (ref 3.5–5.2)
Alkaline Phosphatase: 84 U/L (ref 39–117)
BUN: 15 mg/dL (ref 6–23)
CO2: 27 mEq/L (ref 19–32)
Calcium: 9.2 mg/dL (ref 8.4–10.5)
Chloride: 105 mEq/L (ref 96–112)
Creatinine, Ser: 1 mg/dL (ref 0.4–1.5)
GFR: 100.28 mL/min (ref >60.00)
Glucose, Bld: 116 mg/dL — ABNORMAL HIGH (ref 70–99)
Potassium: 3.9 mEq/L (ref 3.5–5.1)
Sodium: 139 mEq/L (ref 135–145)
Total Bilirubin: 0.7 mg/dL (ref 0.3–1.2)
Total Protein: 7.2 g/dL (ref 6.0–8.3)

## 2012-04-16 LAB — HEPATIC FUNCTION PANEL
ALT: 24 U/L (ref 0–53)
AST: 26 U/L (ref 0–37)
Albumin: 3.9 g/dL (ref 3.5–5.2)
Alkaline Phosphatase: 84 U/L (ref 39–117)
Bilirubin, Direct: 0.1 mg/dL (ref 0.0–0.3)
Total Bilirubin: 0.7 mg/dL (ref 0.3–1.2)
Total Protein: 7.2 g/dL (ref 6.0–8.3)

## 2012-04-16 LAB — PSA: PSA: 4.49 ng/mL — ABNORMAL HIGH (ref 0.10–4.00)

## 2012-04-19 DIAGNOSIS — R972 Elevated prostate specific antigen [PSA]: Secondary | ICD-10-CM | POA: Insufficient documentation

## 2012-04-19 NOTE — Assessment & Plan Note (Signed)
LDL is well controlled. Liver functions are normal.  Plan Continue present medications.

## 2012-04-19 NOTE — Assessment & Plan Note (Signed)
March '08 June '10 July '11 July '12 October '13 PSA    2.07    2.57    3.81  3.14    4.49  Previous year's pattern of slight increase in PSA with a larger jump from July '12 to now.  Plan- Either repeat in 6 months or refer to Urology. Will consult with patient.

## 2012-04-19 NOTE — Assessment & Plan Note (Signed)
BP Readings from Last 3 Encounters:  04/15/12 142/88  01/22/11 110/84  01/03/10 122/70   A little high today - usually well controlled. Labs OK  Plan  Continue present medications  Recheck BP at his convenience

## 2012-04-19 NOTE — Assessment & Plan Note (Signed)
Interval medical history is unremarkable. Physical exam is normal. Labs reviewed - in normal range. He is current with colorectal cancer screening. Discussed pros and cons of prostate cancer screening (USPHCTF recommendations reviewed and ACU April '13 recommendations) and he requests evaluation at this time: PSA 4.49 up from 3.14 ( increase >0.7 per 12 months). Immunizations are up to date.  In summary a nice man who appears medically stable. He will need to have repeat PSA in 6 months or he may opt for Urology evaluation now.

## 2013-05-10 ENCOUNTER — Other Ambulatory Visit (INDEPENDENT_AMBULATORY_CARE_PROVIDER_SITE_OTHER): Payer: BC Managed Care – PPO

## 2013-05-10 ENCOUNTER — Ambulatory Visit (INDEPENDENT_AMBULATORY_CARE_PROVIDER_SITE_OTHER): Payer: BC Managed Care – PPO | Admitting: Internal Medicine

## 2013-05-10 ENCOUNTER — Encounter: Payer: Self-pay | Admitting: Internal Medicine

## 2013-05-10 VITALS — BP 148/90 | HR 74 | Temp 99.1°F | Ht 71.0 in | Wt 206.0 lb

## 2013-05-10 DIAGNOSIS — Z0000 Encounter for general adult medical examination without abnormal findings: Secondary | ICD-10-CM

## 2013-05-10 DIAGNOSIS — I10 Essential (primary) hypertension: Secondary | ICD-10-CM

## 2013-05-10 DIAGNOSIS — R972 Elevated prostate specific antigen [PSA]: Secondary | ICD-10-CM

## 2013-05-10 DIAGNOSIS — E785 Hyperlipidemia, unspecified: Secondary | ICD-10-CM

## 2013-05-10 DIAGNOSIS — Z23 Encounter for immunization: Secondary | ICD-10-CM

## 2013-05-10 LAB — COMPREHENSIVE METABOLIC PANEL
ALT: 25 U/L (ref 0–53)
AST: 24 U/L (ref 0–37)
Albumin: 4.3 g/dL (ref 3.5–5.2)
Alkaline Phosphatase: 75 U/L (ref 39–117)
BUN: 11 mg/dL (ref 6–23)
CO2: 28 mEq/L (ref 19–32)
Calcium: 10 mg/dL (ref 8.4–10.5)
Chloride: 104 mEq/L (ref 96–112)
Creatinine, Ser: 0.9 mg/dL (ref 0.4–1.5)
GFR: 102.42 mL/min (ref >60.00)
Glucose, Bld: 95 mg/dL (ref 70–99)
Potassium: 4.7 mEq/L (ref 3.5–5.1)
Sodium: 140 mEq/L (ref 135–145)
Total Bilirubin: 1.1 mg/dL (ref 0.3–1.2)
Total Protein: 7.3 g/dL (ref 6.0–8.3)

## 2013-05-10 LAB — LIPID PANEL
Cholesterol: 175 mg/dL (ref 0–200)
HDL: 43.5 mg/dL (ref >39.00)
LDL Cholesterol: 104 mg/dL — ABNORMAL HIGH (ref 0–99)
Total CHOL/HDL Ratio: 4
Triglycerides: 136 mg/dL (ref 0.0–149.0)
VLDL: 27.2 mg/dL (ref 0.0–40.0)

## 2013-05-10 LAB — HEPATIC FUNCTION PANEL
ALT: 25 U/L (ref 0–53)
AST: 24 U/L (ref 0–37)
Albumin: 4.3 g/dL (ref 3.5–5.2)
Alkaline Phosphatase: 75 U/L (ref 39–117)
Bilirubin, Direct: 0.2 mg/dL (ref 0.0–0.3)
Total Bilirubin: 1.1 mg/dL (ref 0.3–1.2)
Total Protein: 7.3 g/dL (ref 6.0–8.3)

## 2013-05-10 LAB — PSA: PSA: 5.84 ng/mL — ABNORMAL HIGH (ref 0.10–4.00)

## 2013-05-10 MED ORDER — ATORVASTATIN CALCIUM 20 MG PO TABS: 20.0000 mg | ORAL_TABLET | Freq: Every day | ORAL | Status: DC

## 2013-05-10 MED ORDER — MECLIZINE HCL 25 MG PO TABS: ORAL_TABLET | ORAL | Status: DC

## 2013-05-10 MED ORDER — SILDENAFIL CITRATE 100 MG PO TABS: 100.0000 mg | ORAL_TABLET | Freq: Every day | ORAL | Status: DC | PRN

## 2013-05-10 MED ORDER — BISOPROLOL-HYDROCHLOROTHIAZIDE 5-6.25 MG PO TABS: 1.0000 | ORAL_TABLET | Freq: Every day | ORAL | Status: DC

## 2013-05-10 NOTE — Patient Instructions (Signed)
Good to see you. You are looking fit! Your exam is normal.  Plan is for lab today - results will be mailed out in 7-10 days.  Will have your chart pulled to check on date of last colonoscopy.  Immunizations are current except for shingles vaccine - since it is not covered it is ok to wait. Will give Prevnar companion pneumonia vaccine today.  Please increase your walking to 30 minutes 3 times a week.

## 2013-05-10 NOTE — Progress Notes (Signed)
Pre visit review using our clinic review tool, if applicable. No additional management support is needed unless otherwise documented below in the visit note. 

## 2013-05-10 NOTE — Progress Notes (Signed)
Subjective:    Patient ID: Marcus Beasley, male    DOB: 24-Sep-1945, 67 y.o.   MRN: [id number]  HPI The patient is here for annual Medicare wellness examination and management of other chronic and acute problems.  Interval history: no major, no recent surgery and no injury. Generally feeling well.    The risk factors are reflected in the social history.  The roster of all physicians providing medical care to patient - is listed in the Snapshot section of the chart.  Activities of daily living:  The patient is 100% inedpendent in all ADLs: dressing, toileting, feeding as well as independent mobility  Home safety : The patient has smoke detectors in the home. Falls - none and home is fall safe. They wear seatbelts.No firearms at home. There is no violence in the home.   There is no risks for hepatitis, STDs or HIV. There is no history of blood transfusion. They have no travel history to infectious disease endemic areas of the world.  The patient has seen their dentist in the last six month. They have seen their eye doctor in the last year. They deny  any hearing difficulty and have not had audiologic testing in the last year.    They do not  have excessive sun exposure. Discussed the need for sun protection: hats, long sleeves and use of sunscreen if there is significant sun exposure.   Diet: the importance of a healthy diet is discussed. They do have a healthy diet.  The patient has a regular exercise program: walking  , 20 min duration, 3 per week.  The benefits of regular aerobic exercise were discussed.  Depression screen: there are no signs or vegative symptoms of depression- irritability, change in appetite, anhedonia, sadness/tearfullness.  Cognitive assessment: the patient manages all their financial and personal affairs and is actively engaged.  The following portions of the patient's history were reviewed and updated as appropriate: allergies, current medications, past family  history, past medical history,  past surgical history, past social history  and problem list.  Vision, hearing, body mass index were assessed and reviewed.   During the course of the visit the patient was educated and counseled about appropriate screening and preventive services including : fall prevention , diabetes screening, nutrition counseling, colorectal cancer screening, and recommended immunizations.  Past Medical History  Diagnosis Date  . Labyrinthitis, unspecified   . Unspecified essential hypertension    Past Surgical History  Procedure Laterality Date  . Abcess drainage      abdomen- 26 day hospitalization 1968  . Hernia repair  summer '11    umbilical   Family History  Problem Relation Age of Onset  . Leukemia Mother   . Coronary artery disease Father   . Heart attack Father   . Diabetes Maternal Grandmother   . Heart disease Sister   . Cancer Sister   . Heart disease Brother   . Kidney disease Brother     HD   History   Social History  . Marital Status: Married    Spouse Name: N/A    Number of Children: 3  . Years of Education: 14   Occupational History  . [employer] maintenance 1968    Social History Main Topics  . Smoking status: Never Smoker   . Smokeless tobacco: Never Used  . Alcohol Use: 0.5 oz/week    1 drink(s) per week     Comment: beer  . Drug Use: No  . Sexual Activity: Yes  Partners: Female   Other Topics Concern  . 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 2 dtrs , 1 son - homicide. 5 grandchildren. 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    Current Outpatient Prescriptions on File Prior to Visit  Medication Sig Dispense Refill  . aspirin 325 MG tablet Take 325 mg by mouth daily.        Marland Kitchen atorvastatin (LIPITOR) 20 MG tablet Take 1 tablet (20 mg total) by mouth daily.  90 tablet  3  . bisoprolol-hydrochlorothiazide (ZIAC) 5-6.25 MG per tablet Take 1 tablet by mouth  daily.  90 tablet  3  . meclizine (ANTIVERT) 25 MG tablet 1/2 or 1 tab every 6 hours for dizziness  90 tablet  3  . sildenafil (VIAGRA) 100 MG tablet Take 1 tablet (100 mg total) by mouth daily as needed.  5 tablet  4   No current facility-administered medications on file prior to visit.      Review of Systems Constitutional:  Negative for fever, chills, activity change and unexpected weight change.  HEENT:  Negative for hearing loss, ear pain, congestion, neck stiffness and postnasal drip. Negative for sore throat or swallowing problems. Upper teeth pulled and new denture.   Eyes: Negative for vision loss or change in visual acuity.  Respiratory: Negative for chest tightness and wheezing. Negative for DOE.   Cardiovascular: Negative for chest pain or palpitations. No decreased exercise tolerance Gastrointestinal: No change in bowel habit. No bloating or gas. No reflux or indigestion Genitourinary: Negative for urgency, frequency, flank pain and difficulty urinating.  Musculoskeletal: Negative for myalgias, back pain, arthralgias and gait problem.  Neurological: Negative for dizziness, tremors, weakness and headaches.  Hematological: Negative for adenopathy.  Psychiatric/Behavioral: Negative for behavioral problems and dysphoric mood.       Objective:   Physical Exam Filed Vitals:   05/10/13 1338  BP: 148/90  Pulse: 74  Temp: 99.1 F (37.3 C)   Wt Readings from Last 3 Encounters:  05/10/13 206 lb (93.441 kg)  04/15/12 201 lb (91.173 kg)  01/22/11 209 lb (94.802 kg)   Gen'l: Well nourished well developed  male in no acute distress  HEENT: Head: Normocephalic and atraumatic. Right Ear: External ear normal. EAC/TM nl. Left Ear: External ear normal.  EAC/TM nl. Nose: Nose normal. Mouth/Throat: Oropharynx is clear and moist. Dentition - full upper denture, lower native dentition with missing molars remainder in good repair. No buccal lesions. Posterior pharynx clear. Eyes:  Conjunctivae and sclera clear. EOM intact. Pupils are equal, round, and reactive to light. Right eye exhibits no discharge. Left eye exhibits no discharge. Neck: Normal range of motion. Neck supple. No JVD present. No tracheal deviation present. No thyromegaly present.  Cardiovascular: Normal rate, regular rhythm, no gallop, no friction rub, no murmur heard.      Quiet precordium. 2+ radial and DP pulses . No carotid bruits Pulmonary/Chest: Effort normal. No respiratory distress or increased WOB, no wheezes, no rales. No chest wall deformity or CVAT. Abdomen: Soft. Bowel sounds are normal in all quadrants. He exhibits no distension, no tenderness, no rebound or guarding, No heptosplenomegaly  Genitourinary:  deferred Musculoskeletal: Normal range of motion. He exhibits no edema and no tenderness.       Small and large joints without redness, synovial thickening or deformity. Full range of motion preserved about all small, median and large joints.  Lymphadenopathy:    He  has no cervical or supraclavicular adenopathy.  Neurological: He is alert and oriented to person, place, and time. CN II-XII intact. DTRs 2+ and symmetrical biceps, radial and patellar tendons. Cerebellar function normal with no tremor, rigidity, normal gait and station.  Skin: Skin is warm and dry. No rash noted. No erythema.  Psychiatric: He has a normal mood and affect. His behavior is normal. Thought content normal.         Assessment & Plan:

## 2013-05-11 NOTE — Assessment & Plan Note (Signed)
Interval history is negative for major illness, surgery or injury. Physical exam is normal. Lab results reviewed - ok except for rising PSA. He is current with colorectal cancer screening. Rising PSA noted and will have patient return for discussion. Immunizations are up to date except for Zostvax - not covered by insurance.   In summary  A very nice man who appears to be doing well except for rising PSA.

## 2013-05-11 NOTE — Assessment & Plan Note (Signed)
Slow progression of PSA over previous 6 years. He is asymptomatic.  Plan Will discuss options with the patient: watchful waiting vs GU evaluation and potential for treatment.

## 2013-05-11 NOTE — Assessment & Plan Note (Signed)
BP Readings from Last 3 Encounters:  05/10/13 148/90  04/15/12 142/88  01/22/11 110/84   suboptimal control but at JNC 8 goal.  PLan Continue present medications.

## 2013-05-11 NOTE — Assessment & Plan Note (Signed)
Taking and tolerating "statin" therapy. Lipid profile with LDL at goal and HDL at goal. LFTs normal

## 2013-05-13 ENCOUNTER — Encounter: Payer: Self-pay | Admitting: Internal Medicine

## 2013-05-16 ENCOUNTER — Ambulatory Visit (INDEPENDENT_AMBULATORY_CARE_PROVIDER_SITE_OTHER): Payer: BC Managed Care – PPO | Admitting: Internal Medicine

## 2013-05-16 ENCOUNTER — Encounter: Payer: Self-pay | Admitting: Internal Medicine

## 2013-05-16 VITALS — BP 150/100 | HR 69 | Temp 98.2°F | Wt 198.0 lb

## 2013-05-16 DIAGNOSIS — R972 Elevated prostate specific antigen [PSA]: Secondary | ICD-10-CM

## 2013-05-16 NOTE — Patient Instructions (Signed)
Elevated PSA - mild elevation but greater than the threshold of an increase of 0.7 units per 12 months.  Plan 1st available appointment with a urologist ([facility] across the street) for evaluation and probable prostate biopsy.   Prostate-Specific Antigen The prostate-specific antigen (PSA) is a blood test. It is used to help detect early forms of prostate cancer. The test is usually used along with other tests. The test is also used to follow the course of those who already have prostate cancer or who have been treated for prostate cancer. Some factors interfere with the results of the PSA. The factors listed below will either increase or decrease the PSA levels. They are:  Prescriptions used for male baldness.  Some herbs.  Active prostate infection.  Prior instrumentation or urinary catheterization.  Ejaculation up to 2 days prior to testing.  A noncancerous enlargement of the prostate.  Inflammation of the prostate.  Active urinary tract infection. If your test results are elevated, your caregiver will discuss the results with you. Your caregiver will also let you know if more evaluation is needed. PREPARATION FOR TEST No preparation or fasting is necessary. NORMAL FINDINGS Less than 4 ng/mL or Less than 32mcg/L (SI units) Ranges for normal findings may vary among different laboratories and hospitals. You should always check with your caregiver after having lab work or other tests done to discuss the meaning of your test results and whether your values are considered within normal limits. MEANING OF TEST  A normal value means prostate cancer is less likely. The chance of having prostate cancer increases if the value is between 4 ng/mL and 10 ng/mL. However, further testing will be needed. Values above 10 ng/mL indicate that there is a much higher chance of having prostate cancer (if the above situations that raise PSA are not present). Your caregiver will go over your test  results with you and discuss the importance of this test. If this value is elevated, your caregiver may recommend further testing or evaluation. OBTAINING THE TEST RESULTS It is your responsibility to obtain your test results. Ask the lab or department performing the test when and how you will get your results. Document Released: 07/12/2004 Document Revised: 09/01/2011 Document Reviewed: 01/15/2007 [facility] Patient Information 2014 [place], [place].

## 2013-05-16 NOTE — Progress Notes (Signed)
Pre visit review using our clinic review tool, if applicable. No additional management support is needed unless otherwise documented below in the visit note. 

## 2013-05-16 NOTE — Progress Notes (Signed)
  Subjective:    Patient ID: Marcus Beasley, male    DOB: [birth year], 67 y.o.   MRN: [id number]  HPI At his annual CPX Marcus Beasley was found to have a >0.7 rise in PSA. He presents to discuss options.  PMH, FamHx and SocHx reviewed for any changes and relevance. meds - unchanged   Review of Systems No new symptoms    Objective:   Physical Exam Filed Vitals:   05/16/13 1129  BP: 150/100  Pulse: 69  Temp: 98.2 F (36.8 C)         Assessment & Plan:

## 2013-05-16 NOTE — Assessment & Plan Note (Signed)
Discussed pros and cons of evaulation for prostate cancer. With the rise in PSA patient and his wife want to move ahead with evaluation.  Plan Refer to urology - 1 st available for further work-up.  (greater than 50% of 20 min   visit spent on education and counseling)

## 2013-06-17 ENCOUNTER — Other Ambulatory Visit: Payer: Self-pay | Admitting: Internal Medicine

## 2013-07-12 ENCOUNTER — Emergency Department (HOSPITAL_COMMUNITY)
Admission: EM | Admit: 2013-07-12 | Discharge: 2013-07-12 | Disposition: A | Discharge status: Home / Self Care | Payer: BC Managed Care – PPO | Attending: Emergency Medicine | Admitting: Emergency Medicine

## 2013-07-12 ENCOUNTER — Encounter (HOSPITAL_COMMUNITY): Payer: Self-pay | Admitting: Emergency Medicine

## 2013-07-12 DIAGNOSIS — Z79899 Other long term (current) drug therapy: Secondary | ICD-10-CM | POA: Insufficient documentation

## 2013-07-12 DIAGNOSIS — R040 Epistaxis: Secondary | ICD-10-CM | POA: Insufficient documentation

## 2013-07-12 DIAGNOSIS — I10 Essential (primary) hypertension: Secondary | ICD-10-CM | POA: Insufficient documentation

## 2013-07-12 DIAGNOSIS — Z7982 Long term (current) use of aspirin: Secondary | ICD-10-CM | POA: Insufficient documentation

## 2013-07-12 DIAGNOSIS — Z8669 Personal history of other diseases of the nervous system and sense organs: Secondary | ICD-10-CM | POA: Insufficient documentation

## 2013-07-12 MED ORDER — OXYMETAZOLINE HCL 0.05 % NA SOLN
1.0000 | Freq: Once | NASAL | Status: AC
  Administered 2013-07-12: 1 via NASAL
  Filled 2013-07-12 (×2): qty 15

## 2013-07-12 NOTE — ED Provider Notes (Signed)
CSN: [id number]     Arrival date & time 07/12/13  1540 History   First MD Initiated Contact with Patient 07/12/13 0801     Chief Complaint  Patient presents with  . Epistaxis   (Consider location/radiation/quality/duration/timing/severity/associated sxs/prior Treatment) Patient is a 68 y.o. male presenting with nosebleeds. The history is provided by the patient.  Epistaxis Location:  L nare Severity:  Moderate Duration:  5 hours Timing:  Constant Progression:  Unchanged Chronicity:  New Context: aspirin use and hypertension   Context: not anticoagulants, not BiPAP, not bleeding disorder, not CPAP, not drug use, not elevation change, not foreign body, not home oxygen, not nose picking, not recent infection, not thrombocytopenia and not trauma   Relieved by:  Nothing Worsened by:  Nothing tried Associated symptoms: no congestion, no cough and no sneezing     Past Medical History  Diagnosis Date  . Labyrinthitis, unspecified   . Unspecified essential hypertension    Past Surgical History  Procedure Laterality Date  . Abcess drainage      abdomen- 26 day hospitalization 1968  . Hernia repair  summer '11    umbilical   Family History  Problem Relation Age of Onset  . Leukemia Mother   . Coronary artery disease Father   . Heart attack Father   . Diabetes Maternal Grandmother   . Heart disease Sister   . Cancer Sister   . Heart disease Brother   . Kidney disease Brother     HD   History  Substance Use Topics  . Smoking status: Never Smoker   . Smokeless tobacco: Never Used  . Alcohol Use: 0.5 oz/week    1 drink(s) per week     Comment: beer    Review of Systems  HENT: Positive for nosebleeds. Negative for congestion, facial swelling, rhinorrhea, sinus pressure and sneezing.   Eyes: Negative for visual disturbance.  Respiratory: Negative for cough.   Gastrointestinal: Negative for vomiting.    Allergies  Review of patient's allergies indicates no known  allergies.  Home Medications   Current Outpatient Rx  Name  Route  Sig  Dispense  Refill  . aspirin 325 MG tablet   Oral   Take 325 mg by mouth daily.           Marland Kitchen atorvastatin (LIPITOR) 20 MG tablet   Oral   Take 1 tablet (20 mg total) by mouth daily.   90 tablet   3   . bisoprolol-hydrochlorothiazide (ZIAC) 5-6.25 MG per tablet   Oral   Take 1 tablet by mouth daily.   90 tablet   3   . multivitamin-iron-minerals-folic acid (CENTRUM) chewable tablet   Oral   Chew 1 tablet by mouth daily.          BP 131/91  Pulse 97  Temp(Src) 98.3 F (36.8 C)  Resp 16  SpO2 99% Physical Exam Physical Exam  Nursing note and vitals reviewed. Constitutional: He appears well-developed and well-nourished. No distress.  HENT:  Head: Normocephalic and atraumatic.  Eyes: Conjunctivae normal are normal. No scleral icterus.  Nose: Active epistaxis predominantly from L nare. Spitting clots. Unable to see source of active bleeding. Neck: Normal range of motion. Neck supple.  Cardiovascular: Normal rate, regular rhythm and normal heart sounds.   Pulmonary/Chest: Effort normal and breath sounds normal. No respiratory distress.  Abdominal: Soft. There is no tenderness.  Musculoskeletal: He exhibits no edema.  Neurological: He is alert.  Skin: Skin is warm and dry. He is  not diaphoretic.  Psychiatric: His behavior is normal.    ED Course  EPISTAXIS MANAGEMENT Date/Time: 07/13/2013 6:48 AM Performed by: Margarita Mail Authorized by: Margarita Mail Consent: Verbal consent obtained. Risks and benefits: risks, benefits and alternatives were discussed Consent given by: patient Patient identity confirmed: verbally with patient Local anesthetic: lidocaine spray Patient sedated: no Treatment site: left anterior Repair method: suction (rapid rhino) Post-procedure assessment: bleeding stopped Treatment complexity: simple Patient tolerance: Patient tolerated the procedure well with no  immediate complications.   (including critical care time) Labs Review Labs Reviewed - No data to display Imaging Review No results found.  EKG Interpretation   None       MDM   1. Epistaxis   2. HTN (hypertension)    Patient epistaxis managed. Seen in shared visit with Dr. Darl Householder. Patient discharged without abx. Will follow with ENT in 48 hours. See PCP for htn management. Return precautions discussed. The patient appears reasonably screened and/or stabilized for discharge and I doubt any other medical condition or other [facility] requiring further screening, evaluation, or treatment in the ED at this time prior to discharge.    Margarita Mail, PA-C 07/13/13 [phone]

## 2013-07-12 NOTE — Discharge Instructions (Signed)
Please follow up with ENT as soon as possible. Please return to the ER if your bleeding returns Follow up with your doctor about your blood pressure  Nosebleed Nosebleeds can be caused by many conditions including trauma, infections, polyps, foreign bodies, dry mucous membranes or climate, medications and air conditioning. Most nosebleeds occur in the front of the nose. It is because of this location that most nosebleeds can be controlled by pinching the nostrils gently and continuously. Do this for at least 10 to 20 minutes. The reason for this long continuous pressure is that you must hold it long enough for the blood to clot. If during that 10 to 20 minute time period, pressure is released, the process may have to be started again. The nosebleed may stop by itself, quit with pressure, need concentrated heating (cautery) or stop with pressure from packing. HOME CARE INSTRUCTIONS   If your nose was packed, try to maintain the pack inside until your caregiver removes it. If a gauze pack was used and it starts to fall out, gently replace or cut the end off. Do not cut if a balloon catheter was used to pack the nose. Otherwise, do not remove unless instructed.  Avoid blowing your nose for 12 hours after treatment. This could dislodge the pack or clot and start bleeding again.  If the bleeding starts again, sit up and bending forward, gently pinch the front half of your nose continuously for 20 minutes.  If bleeding was caused by dry mucous membranes, cover the inside of your nose every morning with a petroleum or antibiotic ointment. Use your little fingertip as an applicator. Do this as needed during dry weather. This will keep the mucous membranes moist and allow them to heal.  Maintain humidity in your home by using less air conditioning or using a humidifier.  Do not use aspirin or medications which make bleeding more likely. Your caregiver can give you recommendations on this.  Resume normal  activities as able but try to avoid straining, lifting or bending at the waist for several days.  If the nosebleeds become recurrent and the cause is unknown, your caregiver may suggest laboratory tests. SEEK IMMEDIATE MEDICAL CARE IF:   Bleeding recurs and cannot be controlled.  There is unusual bleeding from or bruising on other parts of the body.  You have a fever.  Nosebleeds continue.  There is any worsening of the condition which originally brought you in.  You become lightheaded, feel faint, become sweaty or vomit blood. MAKE SURE YOU:   Understand these instructions.  Will watch your condition.  Will get help right away if you are not doing well or get worse. Document Released: 03/19/2005 Document Revised: 09/01/2011 Document Reviewed: 05/11/2009 [facility] Patient Information 2014 [place], [place].

## 2013-07-12 NOTE — ED Notes (Signed)
Nosebleed since 4am; takes aspirin daily--increased from 81 mg to 325mg  a wk ago;

## 2013-07-14 NOTE — ED Provider Notes (Signed)
Medical screening examination/treatment/procedure(s) were conducted as a shared visit with non-physician practitioner(s) and myself.  I personally evaluated the patient during the encounter.  EKG Interpretation   None       Marcus Beasley is a 68 y.o. male here with nose bleed. She is on aspirin at home. L sided nose bleed since this AM. Tried pressure, not stopping. I placed rhinorocket for anterior packing and bleeding stopped. Will have her see ENT in 2 days for packing removal.    Wandra Arthurs, MD 07/14/13 1515

## 2013-07-22 ENCOUNTER — Telehealth: Payer: Self-pay | Admitting: Internal Medicine

## 2013-07-22 NOTE — Telephone Encounter (Signed)
Received 3 pages from [facility], sent to Dr. Norins. 07/22/13/ss °

## 2013-07-25 ENCOUNTER — Encounter (HOSPITAL_COMMUNITY): Payer: Self-pay | Admitting: Emergency Medicine

## 2013-07-25 ENCOUNTER — Emergency Department (HOSPITAL_COMMUNITY)
Admission: EM | Admit: 2013-07-25 | Discharge: 2013-07-25 | Disposition: A | Discharge status: Home / Self Care | Payer: BC Managed Care – PPO | Attending: Emergency Medicine | Admitting: Emergency Medicine

## 2013-07-25 DIAGNOSIS — I10 Essential (primary) hypertension: Secondary | ICD-10-CM | POA: Insufficient documentation

## 2013-07-25 DIAGNOSIS — Z8669 Personal history of other diseases of the nervous system and sense organs: Secondary | ICD-10-CM | POA: Insufficient documentation

## 2013-07-25 DIAGNOSIS — Z7982 Long term (current) use of aspirin: Secondary | ICD-10-CM | POA: Insufficient documentation

## 2013-07-25 DIAGNOSIS — Z79899 Other long term (current) drug therapy: Secondary | ICD-10-CM | POA: Insufficient documentation

## 2013-07-25 DIAGNOSIS — R339 Retention of urine, unspecified: Secondary | ICD-10-CM | POA: Diagnosis present

## 2013-07-25 DIAGNOSIS — R109 Unspecified abdominal pain: Secondary | ICD-10-CM | POA: Insufficient documentation

## 2013-07-25 LAB — URINALYSIS, ROUTINE W REFLEX MICROSCOPIC
Bilirubin Urine: NEGATIVE
Glucose, UA: NEGATIVE mg/dL
Ketones, ur: NEGATIVE mg/dL
Leukocytes, UA: NEGATIVE
Nitrite: NEGATIVE
Protein, ur: NEGATIVE mg/dL
Specific Gravity, Urine: 1.011 (ref 1.005–1.030)
Urobilinogen, UA: 0.2 mg/dL (ref 0.0–1.0)
pH: 5.5 (ref 5.0–8.0)

## 2013-07-25 LAB — POCT I-STAT, CHEM 8
BUN: 16 mg/dL (ref 6–23)
Calcium, Ion: 1.25 mmol/L (ref 1.13–1.30)
Chloride: 105 mEq/L (ref 96–112)
Creatinine, Ser: 1 mg/dL (ref 0.50–1.35)
Glucose, Bld: 167 mg/dL — ABNORMAL HIGH (ref 70–99)
HCT: 40 % (ref 39.0–52.0)
Hemoglobin: 13.6 g/dL (ref 13.0–17.0)
Potassium: 3.8 mEq/L (ref 3.7–5.3)
Sodium: 144 mEq/L (ref 137–147)
TCO2: 23 mmol/L (ref 0–100)

## 2013-07-25 LAB — URINE MICROSCOPIC-ADD ON

## 2013-07-25 MED ORDER — CIPROFLOXACIN HCL 500 MG PO TABS: 500.0000 mg | ORAL_TABLET | Freq: Two times a day (BID) | ORAL | Status: DC

## 2013-07-25 NOTE — Discharge Instructions (Signed)
Acute Urinary Retention, Male °Acute urinary retention is the temporary inability to urinate. °This is a common problem in older men. As men age their prostates become larger and block the flow of urine from the bladder. This is usually a problem that has come on gradually.  °HOME CARE INSTRUCTIONS °If you are sent home with a Foley catheter and a drainage system, you will need to discuss the best course of action with your health care provider. While the catheter is in, maintain a good intake of fluids. Keep the drainage bag emptied and lower than your catheter. This is so that contaminated urine will not flow back into your bladder, which could lead to a urinary tract infection. °There are two main types of drainage bags. One is a large bag that usually is used at night. It has a good capacity that will allow you to sleep through the night without having to empty it. The second type is called a leg bag. It has a smaller capacity, so it needs to be emptied more frequently. However, the main advantage is that it can be attached by a leg strap and can go underneath your clothing, allowing you the freedom to move about or leave your home. °Only take over-the-counter or prescription medicines for pain, discomfort, or fever as directed by your health care provider.  °SEEK MEDICAL CARE IF: °· You develop a low-grade fever. °· You experience spasms or leakage of urine with the spasms. °SEEK IMMEDIATE MEDICAL CARE IF:  °· You develop chills or fever. °· Your catheter stops draining urine. °· Your catheter falls out. °· You start to develop increased bleeding that does not respond to rest and increased fluid intake. °MAKE SURE YOU: °· Understand these instructions. °· Will watch your condition. °· Will get help right away if you are not doing well or get worse. °Document Released: 09/15/2000 Document Revised: 02/09/2013 Document Reviewed: 11/18/2012 °[employer]® Patient Information ©2014 [employer]. ° °

## 2013-07-25 NOTE — ED Notes (Addendum)
Pt reports prostate biopsy on Thursday due to level being high, states he was doing fine until this morning, unable to urinate. Reports last urination last night before bed. Pain at present 10/10. Pt writhing in pain.

## 2013-07-25 NOTE — ED Notes (Signed)
Foley placed already when went in room, pt states he feels better now that foley is in, clear/yellow urine draining in foley catheter bag. Pt in no distress at this time.

## 2013-07-25 NOTE — ED Notes (Signed)
Bed: WA08 Expected date:  Expected time:  Means of arrival:  Comments: 

## 2013-07-25 NOTE — ED Provider Notes (Signed)
CSN: [id number]     Arrival date & time 07/25/13  0741 History   First MD Initiated Contact with Patient 07/25/13 [phone]     Chief Complaint  Patient presents with  . unable to urinate    (Consider location/radiation/quality/duration/timing/severity/associated sxs/prior Treatment) Patient is a 68 y.o. male presenting with male genitourinary complaint. The history is provided by the patient.  Male GU Problem Presenting symptoms: no dysuria   Presenting symptoms comment:  Urinary retention Context: spontaneously   Context comment:  Recent prostate bx Relieved by: cath. Worsened by:  Nothing tried Ineffective treatments:  None tried Associated symptoms: abdominal pain   Associated symptoms: no diarrhea, no fever, no hematuria, no nausea and no vomiting     Past Medical History  Diagnosis Date  . Labyrinthitis, unspecified   . Unspecified essential hypertension    Past Surgical History  Procedure Laterality Date  . Abcess drainage      abdomen- 26 day hospitalization 1968  . Hernia repair  summer '11    umbilical   Family History  Problem Relation Age of Onset  . Leukemia Mother   . Coronary artery disease Father   . Heart attack Father   . Diabetes Maternal Grandmother   . Heart disease Sister   . Cancer Sister   . Heart disease Brother   . Kidney disease Brother     HD   History  Substance Use Topics  . Smoking status: Never Smoker   . Smokeless tobacco: Never Used  . Alcohol Use: 0.5 oz/week    1 drink(s) per week     Comment: beer    Review of Systems  Constitutional: Negative for fever.  HENT: Negative for drooling and rhinorrhea.   Eyes: Negative for pain.  Respiratory: Negative for cough and shortness of breath.   Cardiovascular: Negative for chest pain and leg swelling.  Gastrointestinal: Positive for abdominal pain. Negative for nausea, vomiting and diarrhea.  Genitourinary: Negative for dysuria and hematuria.       Mild amount of dark red blood from  rectum.   Musculoskeletal: Negative for gait problem and neck pain.  Skin: Negative for color change.  Neurological: Negative for numbness and headaches.  Hematological: Negative for adenopathy.  Psychiatric/Behavioral: Negative for behavioral problems.  All other systems reviewed and are negative.    Allergies  Review of patient's allergies indicates no known allergies.  Home Medications   Current Outpatient Rx  Name  Route  Sig  Dispense  Refill  . aspirin 325 MG tablet   Oral   Take 325 mg by mouth daily.           Marland Kitchen atorvastatin (LIPITOR) 20 MG tablet   Oral   Take 1 tablet (20 mg total) by mouth daily.   90 tablet   3   . bisoprolol-hydrochlorothiazide (ZIAC) 5-6.25 MG per tablet   Oral   Take 1 tablet by mouth daily.   90 tablet   3   . multivitamin-iron-minerals-folic acid (CENTRUM) chewable tablet   Oral   Chew 1 tablet by mouth daily.         Marland Kitchen OVER THE COUNTER MEDICATION      Over the counter antibiotic nasal ointment         . oxymetazoline (AFRIN) 0.05 % nasal spray   Each Nare   Place 1 spray into both nostrils daily as needed for congestion.          BP 157/90  Pulse 98  Temp(Src) 97.4  F (36.3 C) (Oral)  Resp 18  SpO2 100% Physical Exam  Nursing note and vitals reviewed. Constitutional: He is oriented to person, place, and time. He appears well-developed and well-nourished.  HENT:  Head: Normocephalic and atraumatic.  Right Ear: External ear normal.  Left Ear: External ear normal.  Nose: Nose normal.  Mouth/Throat: Oropharynx is clear and moist. No oropharyngeal exudate.  Eyes: Conjunctivae and EOM are normal. Pupils are equal, round, and reactive to light.  Neck: Normal range of motion. Neck supple.  Cardiovascular: Normal rate, regular rhythm, normal heart sounds and intact distal pulses.  Exam reveals no gallop and no friction rub.   No murmur heard. Pulmonary/Chest: Effort normal and breath sounds normal. No respiratory  distress. He has no wheezes.  Abdominal: Soft. Bowel sounds are normal. He exhibits no distension. There is no tenderness. There is no rebound and no guarding.  Genitourinary:  Mildly enlarged prostate noted on rectal exam.  No gross blood seen.  Musculoskeletal: Normal range of motion. He exhibits no edema and no tenderness.  Neurological: He is alert and oriented to person, place, and time.  Skin: Skin is warm and dry.  Psychiatric: He has a normal mood and affect. His behavior is normal.    ED Course  Procedures (including critical care time) Labs Review Labs Reviewed  URINALYSIS, ROUTINE W REFLEX MICROSCOPIC - Abnormal; Notable for the following:    APPearance CLOUDY (*)    Hgb urine dipstick LARGE (*)    All other components within normal limits  URINE MICROSCOPIC-ADD ON - Abnormal; Notable for the following:    Bacteria, UA MANY (*)    All other components within normal limits  POCT I-STAT, CHEM 8 - Abnormal; Notable for the following:    Glucose, Bld 167 (*)    All other components within normal limits   Imaging Review No results found.  EKG Interpretation   None       MDM   1. Urinary retention    8:39 AM 68 y.o. male status post prostate biopsy 4 days ago who presents with acute urinary retention. The patient notes he had been urinating normally until this morning. He states that he strained while attempting to have a bowel movement this morning and was unable to produce any urine. He also notes some mild dark red blood from his rectum. Prior to my evaluation here he has been catheterized and Foley catheter was placed. He is currently asymptomatic and feels well. His abdomen is soft and benign. I performed a rectal exam which showed a mildly enlarged prostate but no gross blood. Will get screening I stat chem 8 and urinalysis.  10:25 AM: Cr normal. Lower suspicion for UTI, but given pt's GU sx and many bacteria noted on cath specimen, will tx.  I have discussed the  diagnosis/risks/treatment options with the patient and believe the pt to be eligible for discharge home to follow-up with Dr. Jasmine December in the next 1-2 days. We also discussed returning to the ED immediately if new or worsening sx occur. We discussed the sx which are most concerning (e.g., abd pain, fever, dark/bloody urine) that necessitate immediate return. Medications administered to the patient during their visit and any new prescriptions provided to the patient are listed below.  Medications given during this visit Medications - No data to display  Discharge Medication List as of 07/25/2013 10:25 AM    START taking these medications   Details  ciprofloxacin (CIPRO) 500 MG tablet Take 1 tablet (  500 mg total) by mouth 2 (two) times daily. One po bid x 7 days, Starting 07/25/2013, Until Discontinued, Print         Blanchard Kelch, MD 07/26/13 1230

## 2013-08-07 ENCOUNTER — Emergency Department (HOSPITAL_COMMUNITY)
Admission: EM | Admit: 2013-08-07 | Discharge: 2013-08-07 | Disposition: A | Discharge status: Home / Self Care | Payer: BC Managed Care – PPO | Attending: Emergency Medicine | Admitting: Emergency Medicine

## 2013-08-07 ENCOUNTER — Encounter (HOSPITAL_COMMUNITY): Payer: Self-pay | Admitting: Emergency Medicine

## 2013-08-07 DIAGNOSIS — Z792 Long term (current) use of antibiotics: Secondary | ICD-10-CM | POA: Insufficient documentation

## 2013-08-07 DIAGNOSIS — R339 Retention of urine, unspecified: Secondary | ICD-10-CM | POA: Insufficient documentation

## 2013-08-07 DIAGNOSIS — R350 Frequency of micturition: Secondary | ICD-10-CM | POA: Insufficient documentation

## 2013-08-07 DIAGNOSIS — I10 Essential (primary) hypertension: Secondary | ICD-10-CM | POA: Insufficient documentation

## 2013-08-07 DIAGNOSIS — Z7982 Long term (current) use of aspirin: Secondary | ICD-10-CM | POA: Insufficient documentation

## 2013-08-07 DIAGNOSIS — Z8669 Personal history of other diseases of the nervous system and sense organs: Secondary | ICD-10-CM | POA: Insufficient documentation

## 2013-08-07 DIAGNOSIS — R109 Unspecified abdominal pain: Secondary | ICD-10-CM | POA: Insufficient documentation

## 2013-08-07 DIAGNOSIS — Z79899 Other long term (current) drug therapy: Secondary | ICD-10-CM | POA: Insufficient documentation

## 2013-08-07 LAB — URINALYSIS, ROUTINE W REFLEX MICROSCOPIC
Bilirubin Urine: NEGATIVE
Glucose, UA: 100 mg/dL — AB
Hgb urine dipstick: NEGATIVE
Ketones, ur: NEGATIVE mg/dL
Leukocytes, UA: NEGATIVE
Nitrite: NEGATIVE
Protein, ur: NEGATIVE mg/dL
Specific Gravity, Urine: 1.015 (ref 1.005–1.030)
Urobilinogen, UA: 0.2 mg/dL (ref 0.0–1.0)
pH: 5.5 (ref 5.0–8.0)

## 2013-08-07 NOTE — Discharge Instructions (Signed)
Acute Urinary Retention, Male °Acute urinary retention is the temporary inability to urinate. °This is a common problem in older men. As men age their prostates become larger and block the flow of urine from the bladder. This is usually a problem that has come on gradually.  °HOME CARE INSTRUCTIONS °If you are sent home with a Foley catheter and a drainage system, you will need to discuss the best course of action with your health care provider. While the catheter is in, maintain a good intake of fluids. Keep the drainage bag emptied and lower than your catheter. This is so that contaminated urine will not flow back into your bladder, which could lead to a urinary tract infection. °There are two main types of drainage bags. One is a large bag that usually is used at night. It has a good capacity that will allow you to sleep through the night without having to empty it. The second type is called a leg bag. It has a smaller capacity, so it needs to be emptied more frequently. However, the main advantage is that it can be attached by a leg strap and can go underneath your clothing, allowing you the freedom to move about or leave your home. °Only take over-the-counter or prescription medicines for pain, discomfort, or fever as directed by your health care provider.  °SEEK MEDICAL CARE IF: °· You develop a low-grade fever. °· You experience spasms or leakage of urine with the spasms. °SEEK IMMEDIATE MEDICAL CARE IF:  °· You develop chills or fever. °· Your catheter stops draining urine. °· Your catheter falls out. °· You start to develop increased bleeding that does not respond to rest and increased fluid intake. °MAKE SURE YOU: °· Understand these instructions. °· Will watch your condition. °· Will get help right away if you are not doing well or get worse. °Document Released: 09/15/2000 Document Revised: 02/09/2013 Document Reviewed: 11/18/2012 °[employer]® Patient Information ©2014 [employer]. ° °

## 2013-08-07 NOTE — ED Provider Notes (Signed)
CSN: [id number]     Arrival date & time 08/07/13  1552 History   First MD Initiated Contact with Patient 08/07/13 1607     Chief Complaint  Patient presents with  . Urinary Retention     (Consider location/radiation/quality/duration/timing/severity/associated sxs/prior Treatment) Patient is a 68 y.o. male presenting with male genitourinary complaint.  Male GU Problem Presenting symptoms comment:  Urinary retention Context comment:  Prostatic biopsy about 2 weeks ago Relieved by:  Nothing Worsened by:  Nothing tried Associated symptoms: abdominal pain (suprapubic), urinary frequency and urinary retention (only urinating very small amounts)   Associated symptoms: no diarrhea, no fever, no nausea, no penile swelling, no scrotal swelling and no vomiting   Abdominal pain:    Location:  Suprapubic   Pain quality: pressure.   Severity:  Severe   Onset quality:  Gradual   Timing:  Constant   Progression:  Worsening   Chronicity:  Recurrent   Past Medical History  Diagnosis Date  . Labyrinthitis, unspecified   . Unspecified essential hypertension    Past Surgical History  Procedure Laterality Date  . Abcess drainage      abdomen- 26 day hospitalization 1968  . Hernia repair  summer '11    umbilical   Family History  Problem Relation Age of Onset  . Leukemia Mother   . Coronary artery disease Father   . Heart attack Father   . Diabetes Maternal Grandmother   . Heart disease Sister   . Cancer Sister   . Heart disease Brother   . Kidney disease Brother     HD   History  Substance Use Topics  . Smoking status: Never Smoker   . Smokeless tobacco: Never Used  . Alcohol Use: 0.5 oz/week    1 drink(s) per week     Comment: beer    Review of Systems  Unable to perform ROS Constitutional: Negative for fever.  HENT: Negative for congestion.   Respiratory: Negative for cough and shortness of breath.   Cardiovascular: Negative for chest pain.  Gastrointestinal: Positive  for abdominal pain (suprapubic). Negative for nausea, vomiting and diarrhea.  Genitourinary: Positive for frequency. Negative for penile swelling and scrotal swelling.  All other systems reviewed and are negative.      Allergies  Review of patient's allergies indicates no known allergies.  Home Medications   Current Outpatient Rx  Name  Route  Sig  Dispense  Refill  . aspirin 325 MG tablet   Oral   Take 325 mg by mouth daily.           Marland Kitchen atorvastatin (LIPITOR) 20 MG tablet   Oral   Take 1 tablet (20 mg total) by mouth daily.   90 tablet   3   . bisoprolol-hydrochlorothiazide (ZIAC) 5-6.25 MG per tablet   Oral   Take 1 tablet by mouth daily.   90 tablet   3   . ciprofloxacin (CIPRO) 500 MG tablet   Oral   Take 1 tablet (500 mg total) by mouth 2 (two) times daily. One po bid x 7 days   14 tablet   0   . multivitamin-iron-minerals-folic acid (CENTRUM) chewable tablet   Oral   Chew 1 tablet by mouth daily.         Marland Kitchen OVER THE COUNTER MEDICATION      Over the counter antibiotic nasal ointment         . oxymetazoline (AFRIN) 0.05 % nasal spray   Each Nare  Place 1 spray into both nostrils daily as needed for congestion.          BP 152/98  Pulse 98  Temp(Src) 98.5 F (36.9 C) (Oral)  Resp 18  SpO2 100% Physical Exam  Nursing note and vitals reviewed. Constitutional: He is oriented to person, place, and time. He appears well-developed and well-nourished. No distress.  HENT:  Head: Normocephalic and atraumatic.  Eyes: Conjunctivae are normal. No scleral icterus.  Neck: Neck supple.  Cardiovascular: Normal rate and intact distal pulses.   Pulmonary/Chest: Effort normal. No stridor. No respiratory distress.  Abdominal: Normal appearance. He exhibits mass (palpable distended bladder). He exhibits no distension. There is tenderness in the suprapubic area. There is no rigidity, no rebound and no guarding.  Genitourinary: Penis normal. Right testis shows no  mass, no swelling and no tenderness. Left testis shows no mass, no swelling and no tenderness.  Neurological: He is alert and oriented to person, place, and time.  Skin: Skin is warm and dry. No rash noted.  Psychiatric: He has a normal mood and affect. His behavior is normal.    ED Course  Procedures (including critical care time) Labs Review Labs Reviewed  URINALYSIS, ROUTINE W REFLEX MICROSCOPIC - Abnormal; Notable for the following:    Glucose, UA 100 (*)    All other components within normal limits   Imaging Review No results found.  EKG Interpretation   None       MDM   Final diagnoses:  Urinary retention    68 yo male with urinary retention.  Pacing room in discomfort.  Urinating only very small amounts.  Has had to have foley placed for similar problems multiple times recently.  Last removed 3 days ago and had been doing reasonable well until today.  Will place foley and send UA.    Felt much better after foley placement.  UA without signs of infection.  Plan dc with foley and follow up with Urology.    Houston Siren III, MD 08/07/13 [phone]

## 2013-08-07 NOTE — ED Notes (Signed)
Pt from home c/o of acute urinary retention starting this am . Pt has only been able to urinate "dribbles". This is the 3rd time this has happened and had foley catheter's previously.

## 2014-01-23 ENCOUNTER — Encounter: Payer: Self-pay | Admitting: Internal Medicine

## 2014-01-23 ENCOUNTER — Ambulatory Visit (INDEPENDENT_AMBULATORY_CARE_PROVIDER_SITE_OTHER): Payer: BC Managed Care – PPO | Admitting: Internal Medicine

## 2014-01-23 VITALS — BP 138/80 | HR 69 | Temp 97.9°F | Ht 70.4 in | Wt 199.0 lb

## 2014-01-23 DIAGNOSIS — R972 Elevated prostate specific antigen [PSA]: Secondary | ICD-10-CM

## 2014-01-23 DIAGNOSIS — Z0000 Encounter for general adult medical examination without abnormal findings: Secondary | ICD-10-CM

## 2014-01-23 DIAGNOSIS — I10 Essential (primary) hypertension: Secondary | ICD-10-CM

## 2014-01-23 DIAGNOSIS — E785 Hyperlipidemia, unspecified: Secondary | ICD-10-CM

## 2014-01-23 DIAGNOSIS — K429 Umbilical hernia without obstruction or gangrene: Secondary | ICD-10-CM

## 2014-01-23 MED ORDER — ZOSTER VACCINE LIVE 19400 UNT/0.65ML ~~LOC~~ SOLR: 0.6500 mL | Freq: Once | SUBCUTANEOUS | Status: DC

## 2014-01-23 NOTE — Assessment & Plan Note (Signed)
Has to umbilical hernia, basically asymptomatic, we discussed referral to surgery, patient will think about it. Incarceration symptoms discussed

## 2014-01-23 NOTE — Assessment & Plan Note (Addendum)
Chart reviewed: Due for a colonoscopy per GI letter, letter printed for the pt , encourage patient to call GI Zostavax benefits discussed, prescription provided + FH of heart disease: Father, sister, brother. On aspirin, statins, he remains asymptomatic

## 2014-01-23 NOTE — Assessment & Plan Note (Signed)
Good compliance with medication, no ambulatory BPs, BP today is satisfactory, check a BMP

## 2014-01-23 NOTE — Progress Notes (Signed)
Subjective:    Patient ID: Marcus Beasley, male    DOB: 1945/08/06, 68 y.o.   MRN: [id number]  DOS:  01/23/2014 Type of visit - description: Transferring from Dr. Veverly Fells, chart is reviewed  History: Hypertension, good medication compliance, no ambulatory BPs High cholesterol, good medication compliance, no apparent side effects Umbilical hernia, status post surgery, c/o occasional discomfort BPH, status post a biopsy, it was negative, currently asymptomatic.  ROS Denies nausea, vomiting, diarrhea or blood in the stools. No dysuria, gross hematuria and difficulty urinating Diet is regular Exercise, he remains very active at work.   Past Medical History  Diagnosis Date  . HTN (hypertension)   . BPH (benign prostatic hyperplasia)     (-) Bx 2015  . Hyperlipidemia     Past Surgical History  Procedure Laterality Date  . Abcess drainage      abdomen- 26 day hospitalization 1968  . Hernia repair  summer '11    umbilical  . Prostate biopsy  06-2013    (-)    History   Social History  . Marital Status: Married    Spouse Name: N/A    Number of Children: 3  . Years of Education: 14   Occupational History  . [employer], still works    .     Social History Main Topics  . Smoking status: Never Smoker   . Smokeless tobacco: Never Used  . Alcohol Use: 0.5 oz/week    1 drink(s) per week     Comment: beer  . Drug Use: No  . Sexual Activity: Yes    Partners: Female   Other Topics Concern  . 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 2 dtrs , 1 son - homicide. 5 grandchildren. 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        Medication List       This list is accurate as of: 01/23/14 10:35 PM.  Always use your most recent med list.               aspirin 325 MG tablet  Take 325 mg by mouth daily.     atorvastatin 20 MG tablet  Commonly known as:  LIPITOR  Take 1 tablet (20 mg total) by mouth daily.     bisoprolol-hydrochlorothiazide 5-6.25 MG per tablet  Commonly known as:  ZIAC  Take 1 tablet by mouth daily.     multivitamin-iron-minerals-folic acid chewable tablet  Chew 1 tablet by mouth daily.     zoster vaccine live (PF) 19400 UNT/0.65ML injection  Commonly known as:  ZOSTAVAX  Inject 19,400 Units into the skin once.           Objective:   Physical Exam BP 138/80  Pulse 69  Temp(Src) 97.9 F (36.6 C)  Ht 5' 10.4" (1.788 m)  Wt 199 lb (90.266 kg)  BMI 28.24 kg/m2  SpO2 95% General -- alert, well-developed, NAD.  Neck --no thyromegaly , normal carotid pulse  HEENT-- Not pale.   Lungs -- normal respiratory effort, no intercostal retractions, no accessory muscle use, and normal breath sounds.  Heart-- normal rate, regular rhythm, no murmur.  Abdomen-- Not distended, good bowel sounds,soft, non-tender. Reducible ~ 1 inch umbilical hernia, no tender   Extremities-- no pretibial edema bilaterally  Neurologic--  alert & oriented X3. Speech normal, gait appropriate for age, strength symmetric and appropriate for age.  Psych-- Cognition and judgment appear intact. Cooperative with normal  attention span and concentration. No anxious or depressed appearing.          Assessment & Plan:

## 2014-01-23 NOTE — Assessment & Plan Note (Signed)
Status post biopsy approximately 07/2013, negative, currently asymptomatic

## 2014-01-23 NOTE — Progress Notes (Signed)
Pre visit review using our clinic review tool, if applicable. No additional management support is needed unless otherwise documented below in the visit note. 

## 2014-01-23 NOTE — Patient Instructions (Signed)
Get your blood work before you leave   Next visit is for a physical exam by 04-2014, fasting Please make an appointment     At some point this year the clinic will relocate to  [place] and [address] (10 minutes form here)  [place]  [place], [place] [address] [phone]

## 2014-01-23 NOTE — Assessment & Plan Note (Signed)
Good compliance with medicines, no apparent side effects, last FLP satisfactory, no change

## 2014-01-24 ENCOUNTER — Telehealth: Payer: Self-pay | Admitting: Internal Medicine

## 2014-01-24 LAB — BASIC METABOLIC PANEL
BUN: 13 mg/dL (ref 6–23)
CO2: 27 mEq/L (ref 19–32)
Calcium: 9.2 mg/dL (ref 8.4–10.5)
Chloride: 105 mEq/L (ref 96–112)
Creatinine, Ser: 1 mg/dL (ref 0.4–1.5)
GFR: 100.96 mL/min (ref >60.00)
Glucose, Bld: 91 mg/dL (ref 70–99)
Potassium: 3.8 mEq/L (ref 3.5–5.1)
Sodium: 140 mEq/L (ref 135–145)

## 2014-01-24 NOTE — Telephone Encounter (Signed)
Relevant patient education mailed to patient.  

## 2014-02-03 ENCOUNTER — Encounter: Payer: Self-pay | Admitting: Internal Medicine

## 2014-03-03 ENCOUNTER — Encounter: Payer: Self-pay | Admitting: Internal Medicine

## 2014-04-19 ENCOUNTER — Encounter: Payer: Self-pay | Admitting: Internal Medicine

## 2014-05-16 ENCOUNTER — Encounter: Payer: BC Managed Care – PPO | Admitting: Internal Medicine

## 2014-05-24 ENCOUNTER — Encounter: Payer: BC Managed Care – PPO | Admitting: Internal Medicine

## 2014-05-29 LAB — HM DIABETES EYE EXAM

## 2014-06-12 ENCOUNTER — Encounter: Payer: BC Managed Care – PPO | Admitting: Internal Medicine

## 2014-06-14 ENCOUNTER — Encounter: Payer: Self-pay | Admitting: Internal Medicine

## 2014-06-23 HISTORY — PX: COLONOSCOPY: SHX174

## 2014-06-26 ENCOUNTER — Ambulatory Visit (AMBULATORY_SURGERY_CENTER): Payer: Self-pay | Admitting: *Deleted

## 2014-06-26 VITALS — Ht 71.0 in | Wt 205.0 lb

## 2014-06-26 DIAGNOSIS — Z1211 Encounter for screening for malignant neoplasm of colon: Secondary | ICD-10-CM

## 2014-06-26 MED ORDER — MOVIPREP 100 G PO SOLR: 1.0000 | Freq: Once | ORAL | Status: DC

## 2014-06-26 NOTE — Progress Notes (Signed)
Denies allergies to eggs or soy products. Denies complications with sedation or anesthesia. Denies O2 use. Denies use of diet or weight loss medications.  Emmi instructions not given for colonoscopy, pt denies Internet access

## 2014-07-11 ENCOUNTER — Ambulatory Visit (AMBULATORY_SURGERY_CENTER): Payer: Medicare Other | Admitting: Internal Medicine

## 2014-07-11 ENCOUNTER — Encounter: Payer: Self-pay | Admitting: Internal Medicine

## 2014-07-11 VITALS — BP 130/84 | HR 63 | Temp 97.6°F | Resp 16 | Ht 71.0 in | Wt 205.0 lb

## 2014-07-11 DIAGNOSIS — D121 Benign neoplasm of appendix: Secondary | ICD-10-CM

## 2014-07-11 DIAGNOSIS — Z1211 Encounter for screening for malignant neoplasm of colon: Secondary | ICD-10-CM

## 2014-07-11 DIAGNOSIS — D123 Benign neoplasm of transverse colon: Secondary | ICD-10-CM

## 2014-07-11 DIAGNOSIS — D124 Benign neoplasm of descending colon: Secondary | ICD-10-CM

## 2014-07-11 DIAGNOSIS — D122 Benign neoplasm of ascending colon: Secondary | ICD-10-CM

## 2014-07-11 DIAGNOSIS — D120 Benign neoplasm of cecum: Secondary | ICD-10-CM

## 2014-07-11 MED ORDER — SODIUM CHLORIDE 0.9 % IV SOLN: 500.0000 mL | INTRAVENOUS | Status: DC

## 2014-07-11 NOTE — Progress Notes (Signed)
A/ox3 pleased with MAC, report to Suzanne RN 

## 2014-07-11 NOTE — Patient Instructions (Signed)
YOU HAD AN ENDOSCOPIC PROCEDURE TODAY AT [facility]: Refer to the procedure report that was given to you for any specific questions about what was found during the examination.  If the procedure report does not answer your questions, please call your gastroenterologist to clarify.  If you requested that your care partner not be given the details of your procedure findings, then the procedure report has been included in a sealed envelope for you to review at your convenience later.  YOU SHOULD EXPECT: Some feelings of bloating in the abdomen. Passage of more gas than usual.  Walking can help get rid of the air that was put into your GI tract during the procedure and reduce the bloating. If you had a lower endoscopy (such as a colonoscopy or flexible sigmoidoscopy) you may notice spotting of blood in your stool or on the toilet paper. If you underwent a bowel prep for your procedure, then you may not have a normal bowel movement for a few days.  DIET: Your first meal following the procedure should be a light meal and then it is ok to progress to your normal diet.  A half-sandwich or bowl of soup is an example of a good first meal.  Heavy or fried foods are harder to digest and may make you feel nauseous or bloated.  Likewise meals heavy in dairy and vegetables can cause extra gas to form and this can also increase the bloating.  Drink plenty of fluids but you should avoid alcoholic beverages for 24 hours.Try to increase the fiber in your diet.  ACTIVITY: Your care partner should take you home directly after the procedure.  You should plan to take it easy, moving slowly for the rest of the day.  You can resume normal activity the day after the procedure however you should NOT DRIVE or use heavy machinery for 24 hours (because of the sedation medicines used during the test).    SYMPTOMS TO REPORT IMMEDIATELY: A gastroenterologist can be reached at any hour.  During normal business hours, 8:30  AM to 5:00 PM Monday through Friday, call [phone].  After hours and on weekends, please call the GI answering service at [phone] who will take a message and have the physician on call contact you.   Following lower endoscopy (colonoscopy or flexible sigmoidoscopy):  Excessive amounts of blood in the stool  Significant tenderness or worsening of abdominal pains  Swelling of the abdomen that is new, acute  Fever of 100F or higher  FOLLOW UP: If any biopsies were taken you will be contacted by phone or by letter within the next 1-3 weeks.  Call your gastroenterologist if you have not heard about the biopsies in 3 weeks.  Our staff will call the home number listed on your records the next business day following your procedure to check on you and address any questions or concerns that you may have at that time regarding the information given to you following your procedure. This is a courtesy call and so if there is no answer at the home number and we have not heard from you through the emergency physician on call, we will assume that you have returned to your regular daily activities without incident.  SIGNATURES/CONFIDENTIALITY: You and/or your care partner have signed paperwork which will be entered into your electronic medical record.  These signatures attest to the fact that that the information above on your After Visit Summary has been reviewed and is understood.  Full responsibility of the confidentiality of this discharge information lies with you and/or your care-partner.  AVOID ALL NSAIDS IE:  ALEVE, IBUPROFEN FOR TWO WEEKS TO AVOID BLEEDING.  Please, read all of the handouts given to you by your recovery room nurse.

## 2014-07-11 NOTE — Op Note (Signed)
[place]  [address] [place], [id number]   COLONOSCOPY PROCEDURE REPORT  PATIENT: Marcus Beasley, Marcus Beasley  MR#: [id number] BIRTHDATE: [birth year] , 19  yrs. old GENDER: male ENDOSCOPIST: Jerene Bears, MD PROCEDURE DATE:  07/11/2014 PROCEDURE:   Colonoscopy with snare polypectomy First Screening Colonoscopy - Avg.  risk and is 50 yrs.  old or older - No.  Prior Negative Screening - Now for repeat screening. 10 or more years since last screening  History of Adenoma - Now for follow-up colonoscopy & has been > or = to 3 yrs.  N/A  Polyps Removed Today? Yes. ASA CLASS:   Class II INDICATIONS:average risk for colorectal cancer and last colonoscopy completed  years ago. MEDICATIONS: Monitored anesthesia care and Propofol 300 mg IV  DESCRIPTION OF PROCEDURE:   After the risks benefits and alternatives of the procedure were thoroughly explained, informed consent was obtained.  The digital rectal exam revealed no rectal mass.   The [id number]  endoscope was introduced through the anus and advanced to the cecum, which was identified by both the appendix and ileocecal valve. No adverse events experienced. The quality of the prep was good, using MoviPrep  The instrument was then slowly withdrawn as the colon was fully examined.    COLON FINDINGS: Four sessile polyps ranging from 5 to 21mm in size were found in the ascending colon and at the cecum.  Polypectomies were performed with a cold snare.  The resection was complete, the polyp tissue was completely retrieved and sent to histology.   Two sessile polyps ranging between 5-72mm in size were found in the descending colon and transverse colon.  Polypectomies were performed with a cold snare.  The resection was complete, the polyp tissue was completely retrieved and sent to histology.   There was mild diverticulosis noted in the descending colon and sigmoid colon.  Retroflexed views revealed internal hemorrhoids.  The time to cecum=2 minutes 10 seconds.  Withdrawal time=26 minutes 35 seconds.  The scope was withdrawn and the procedure completed.  COMPLICATIONS: There were no immediate complications.   ENDOSCOPIC IMPRESSION: 1.   Four sessile polyps ranging from 5 to 46mm in size were found in the ascending colon and at the cecum; polypectomies were performed with a cold snare 2.   Two sessile polyps ranging between 5-25mm in size were found in the descending colon and transverse colon; polypectomies were performed with a cold snare 3.   Mild diverticulosis was noted in the descending colon and sigmoid colon  RECOMMENDATIONS: 1.  Avoid all NSAIDS for the next 2 weeks. 2.  Await pathology results 3.  High fiber diet 4.  If the polyps removed today are proven to be adenomatous (pre-cancerous) polyps, you will need a colonoscopy in 3 years. Otherwise you should continue to follow colorectal cancer screening guidelines for "routine risk" patients with a colonoscopy in 10 years.  You will receive a letter within 1-2 weeks with the results of your biopsy as well as final recommendations.  Please call my office if you have not received a letter after 3 weeks.  eSigned:  Jerene Bears, MD 07/11/2014 8:45 AM   cc: The Patient and Kathlene November, MD   PATIENT NAME:  Marcus Beasley, Marcus Beasley MR#: [id number]

## 2014-07-12 ENCOUNTER — Telehealth: Payer: Self-pay

## 2014-07-12 NOTE — Telephone Encounter (Signed)
  Follow up Call-  Call back number 07/11/2014  Post procedure Call Back phone  # [phone]  Permission to leave phone message Yes     Patient questions:  Do you have a fever, pain , or abdominal swelling? No. Pain Score  0 *  Have you tolerated food without any problems? Yes.    Have you been able to return to your normal activities? Yes.    Do you have any questions about your discharge instructions: Diet   No. Medications  No. Follow up visit  No.  Do you have questions or concerns about your Care? No.  Actions: * If pain score is 4 or above: No action needed, pain <4.

## 2014-07-18 ENCOUNTER — Encounter: Payer: Self-pay | Admitting: Internal Medicine

## 2014-07-19 ENCOUNTER — Other Ambulatory Visit: Payer: Self-pay | Admitting: Internal Medicine

## 2014-07-24 LAB — PSA: PSA: 6.4

## 2014-07-31 ENCOUNTER — Ambulatory Visit (INDEPENDENT_AMBULATORY_CARE_PROVIDER_SITE_OTHER): Payer: Medicare Other | Admitting: Internal Medicine

## 2014-07-31 ENCOUNTER — Encounter: Payer: Self-pay | Admitting: Internal Medicine

## 2014-07-31 DIAGNOSIS — R972 Elevated prostate specific antigen [PSA]: Secondary | ICD-10-CM

## 2014-07-31 DIAGNOSIS — K429 Umbilical hernia without obstruction or gangrene: Secondary | ICD-10-CM

## 2014-07-31 DIAGNOSIS — I10 Essential (primary) hypertension: Secondary | ICD-10-CM

## 2014-07-31 DIAGNOSIS — E785 Hyperlipidemia, unspecified: Secondary | ICD-10-CM

## 2014-07-31 DIAGNOSIS — Z0000 Encounter for general adult medical examination without abnormal findings: Secondary | ICD-10-CM

## 2014-07-31 MED ORDER — BISOPROLOL-HYDROCHLOROTHIAZIDE 10-6.25 MG PO TABS: 1.0000 | ORAL_TABLET | Freq: Every day | ORAL | Status: DC

## 2014-07-31 NOTE — Assessment & Plan Note (Signed)
Td 2009 Pneumonia shot 2011 Prevnar 2014 Had a flu shot Zostavax prescription provided before  Colonoscopy 06-2014, had polyps, next per GI Prostate cancer screening per urology.  Diet and exercise discussed

## 2014-07-31 NOTE — Assessment & Plan Note (Signed)
Good compliance of medication, labs, see instructions

## 2014-07-31 NOTE — Progress Notes (Signed)
Pre visit review using our clinic review tool, if applicable. No additional management support is needed unless otherwise documented below in the visit note. 

## 2014-07-31 NOTE — Assessment & Plan Note (Signed)
Per Urology  

## 2014-07-31 NOTE — Patient Instructions (Signed)
Stop by the front desk and schedule labs to be done within few days (fasting)  Check the  blood pressure  weekly  Be sure your blood pressure is between 110/65 and  145/85.  if it is consistently higher or lower, let me know    Please come back to the office in 6 months  for a routine check up      Fall Prevention and [place] cause injuries and can affect all age groups. It is possible to use preventive measures to significantly decrease the likelihood of falls. There are many simple measures which can make your home safer and prevent falls. OUTDOORS  Repair cracks and edges of walkways and driveways.  Remove high doorway thresholds.  Trim shrubbery on the main path into your home.  Have good outside lighting.  Clear walkways of tools, rocks, debris, and clutter.  Check that handrails are not broken and are securely fastened. Both sides of steps should have handrails.  Have leaves, snow, and ice cleared regularly.  Use sand or salt on walkways during winter months.  In the garage, clean up grease or oil spills. BATHROOM  Install night lights.  Install grab bars by the toilet and in the tub and shower.  Use non-skid mats or decals in the tub or shower.  Place a plastic non-slip stool in the shower to sit on, if needed.  Keep floors dry and clean up all water on the floor immediately.  Remove soap buildup in the tub or shower on a regular basis.  Secure bath mats with non-slip, double-sided rug tape.  Remove throw rugs and tripping hazards from the floors. BEDROOMS  Install night lights.  Make sure a bedside light is easy to reach.  Do not use oversized bedding.  Keep a telephone by your bedside.  Have a firm chair with side arms to use for getting dressed.  Remove throw rugs and tripping hazards from the floor. KITCHEN  Keep handles on pots and pans turned toward the center of the stove. Use back burners when possible.  Clean up spills  quickly and allow time for drying.  Avoid walking on wet floors.  Avoid hot utensils and knives.  Position shelves so they are not too high or low.  Place commonly used objects within easy reach.  If necessary, use a sturdy step stool with a grab bar when reaching.  Keep electrical cables out of the way.  Do not use floor polish or wax that makes floors slippery. If you must use wax, use non-skid floor wax.  Remove throw rugs and tripping hazards from the floor. STAIRWAYS  Never leave objects on stairs.  Place handrails on both sides of stairways and use them. Fix any loose handrails. Make sure handrails on both sides of the stairways are as long as the stairs.  Check carpeting to make sure it is firmly attached along stairs. Make repairs to worn or loose carpet promptly.  Avoid placing throw rugs at the top or bottom of stairways, or properly secure the rug with carpet tape to prevent slippage. Get rid of throw rugs, if possible.  Have an electrician put in a light switch at the top and bottom of the stairs. OTHER FALL PREVENTION TIPS  Wear low-heel or rubber-soled shoes that are supportive and fit well. Wear closed toe shoes.  When using a stepladder, make sure it is fully opened and both spreaders are firmly locked. Do not climb a closed stepladder.  Add color  or contrast paint or tape to grab bars and handrails in your home. Place contrasting color strips on first and last steps.  Learn and use mobility aids as needed. Install an electrical emergency response system.  Turn on lights to avoid dark areas. Replace light bulbs that burn out immediately. Get light switches that glow.  Arrange furniture to create clear pathways. Keep furniture in the same place.  Firmly attach carpet with non-skid or double-sided tape.  Eliminate uneven floor surfaces.  Select a carpet pattern that does not visually hide the edge of steps.  Be aware of all pets. OTHER HOME SAFETY  TIPS  Set the water temperature for 120 F (48.8 C).  Keep emergency numbers on or near the telephone.  Keep smoke detectors on every level of the home and near sleeping areas. Document Released: 05/30/2002 Document Revised: 12/09/2011 Document Reviewed: 08/29/2011 [facility] Patient Information 2015 [place], [place]. This information is not intended to replace advice given to you by your health care provider. Make sure you discuss any questions you have with your health care provider.     Preventive Care for Adults Ages 6 and over  Blood pressure check.** / Every 1 to 2 years.  Lipid and cholesterol check.**/ Every 5 years beginning at age 53.  Lung cancer screening. / Every year if you are aged 110-80 years and have a 30-pack-year history of smoking and currently smoke or have quit within the past 15 years. Yearly screening is stopped once you have quit smoking for at least 15 years or develop a health problem that would prevent you from having lung cancer treatment.  Fecal occult blood test (FOBT) of stool. / Every year beginning at age 43 and continuing until age 72. You may not have to do this test if you get a colonoscopy every 10 years.  Flexible sigmoidoscopy** or colonoscopy.** / Every 5 years for a flexible sigmoidoscopy or every 10 years for a colonoscopy beginning at age 64 and continuing until age 16.  Hepatitis C blood test.** / For all people born from [birth year] through 1966 and any individual with known risks for hepatitis C.  Abdominal aortic aneurysm (AAA) screening.** / A one-time screening for ages 52 to 15 years who are current or former smokers.  Skin self-exam. / Monthly.  Influenza vaccine. / Every year.  Tetanus, diphtheria, and acellular pertussis (Tdap/Td) vaccine.** / 1 dose of Td every 10 years.  Varicella vaccine.** / Consult your health care provider.  Zoster vaccine.** / 1 dose for adults aged 15 years or older.  Pneumococcal 13-valent conjugate  (PCV13) vaccine.** / Consult your health care provider.  Pneumococcal polysaccharide (PPSV23) vaccine.** / 1 dose for all adults aged 62 years and older.  Meningococcal vaccine.** / Consult your health care provider.  Hepatitis A vaccine.** / Consult your health care provider.  Hepatitis B vaccine.** / Consult your health care provider.  Haemophilus influenzae type b (Hib) vaccine.** / Consult your health care provider. **Family history and personal history of risk and conditions may change your health care provider's recommendations. Document Released: 08/05/2001 Document Revised: 06/14/2013 Document Reviewed: 11/04/2010 [facility] Patient Information 2015 [place], [place]. This information is not intended to replace advice given to you by your health care provider. Make sure you discuss any questions you have with your health care provider.

## 2014-07-31 NOTE — Assessment & Plan Note (Signed)
BP slightly elevated, BP recheck 142/82. Occasionally has high readings at home. Plan: Increase Ziac  5-6.25 to --------> 10-6.25 and monitor BPs

## 2014-07-31 NOTE — Progress Notes (Signed)
Subjective:    Patient ID: Marcus Beasley, male    DOB: [birth year], 69 y.o.   MRN: [id number]  DOS:  07/31/2014 Type of visit - description :  Here for Medicare AWV:  1. Risk factors based on Past M, S, F history: reviewed 2. Physical Activities:  Takes walks sometimes, still works 40 h/week  3. Depression/mood: neg screening  4. Hearing:  No problems noted or reported  5. ADL's: independent, drives  6. Fall Risk: no recent falls, prevention discussed  7. home Safety: does feel safe at home  8. Height, weight, & visual acuity: see VS, sees eye doctor regulalrly 9. Counseling: provided 10. Labs ordered based on risk factors: if needed  11. Referral Coordination: if needed 12. Care Plan, see assessment and plan , written personalized plan provided  13. Cognitive Assessment: motor skills and cognition appropriate for age 76. Care team updated-- updated, will see his new urologist soon   In addition, today we discussed the following: Hypertension, ambulatory BPs [phone] with a diastolic of 63-84 Complains of pain at the base of the right thumb, sometimes he has a trigger finger phenomena and feels a knot. ED, uses Viagra occasionally without apparent side effects High cholesterol, on Lipitor, good compliance and no apparent side effects   Review of Systems  Constitutional: Negative for diaphoresis, appetite change and unexpected weight change.  HENT: Negative for dental problem, ear discharge, facial swelling, trouble swallowing and voice change.   Eyes: Negative for photophobia, discharge and redness.  Respiratory:       No cough or sputum production. No wheezing or SOB  Cardiovascular:       No CP No edema or  palpitations   Gastrointestinal: Negative for nausea, vomiting, abdominal pain and diarrhea.       No blood in the stools   Endocrine: Negative for polydipsia, polyphagia and polyuria.  Genitourinary: Negative for urgency, frequency, hematuria and difficulty urinating.        No dysuria  Musculoskeletal: Negative for back pain, gait problem, neck pain and neck stiffness.          Skin: Negative for color change, pallor and rash.  Allergic/Immunologic: Negative for environmental allergies and food allergies.  Neurological: Negative for dizziness and syncope.       No headaches   Hematological: Negative for adenopathy. Does not bruise/bleed easily.  Psychiatric/Behavioral: Negative for suicidal ideas, hallucinations, behavioral problems and confusion.       No unusual or severe anxiety-depression     Past Medical History  Diagnosis Date  . HTN (hypertension)   . BPH (benign prostatic hyperplasia)     (-) Bx 2015  . Hyperlipidemia     Past Surgical History  Procedure Laterality Date  . Abcess drainage      abdomen- 26 day hospitalization 1968  . Hernia repair  summer '11    umbilical  . Prostate biopsy  06-2013    (-)    History   Social History  . Marital Status: Married    Spouse Name: N/A    Number of Children: 3  . Years of Education: 14   Occupational History  . [employer], still works    .     Social History Main Topics  . Smoking status: Never Smoker   . Smokeless tobacco: Never Used  . Alcohol Use: 0.5 oz/week    1 Not specified per week     Comment: beer  . Drug Use: No  .  Sexual Activity:    Partners: Female   Other Topics Concern  . 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 2 dtrs , 1 son - homicide. 5 grandchildren. 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     Family History  Problem Relation Age of Onset  . Leukemia Mother   . Heart attack Father     MI age 49  . Diabetes Maternal Grandmother   . Heart disease Sister     age 56  . Cancer - Other Sister     type  . Heart disease Brother     age 33  . Kidney disease Brother     HD  . Prostate cancer Neg Hx   . Colon cancer Neg Hx   . Esophageal cancer Neg Hx   . Rectal cancer Neg Hx   . Stomach cancer  Neg Hx       Medication List       This list is accurate as of: 07/31/14  7:16 PM.  Always use your most recent med list.               aspirin 325 MG tablet  Take 325 mg by mouth daily.     atorvastatin 20 MG tablet  Commonly known as:  LIPITOR  TAKE 1 TABLET (20 MG TOTAL) BY MOUTH DAILY.     bisoprolol-hydrochlorothiazide 10-6.25 MG per tablet  Commonly known as:  ZIAC  Take 1 tablet by mouth daily.     meclizine 25 MG tablet  Commonly known as:  ANTIVERT  Take 25 mg by mouth as needed for dizziness.     multivitamin-iron-minerals-folic acid chewable tablet  Chew 1 tablet by mouth daily.     sildenafil 100 MG tablet  Commonly known as:  VIAGRA  Take 100 mg by mouth as needed for erectile dysfunction.           Objective:   Physical Exam  Constitutional: He is oriented to person, place, and time. He appears well-developed. No distress.  HENT:  Head: Normocephalic and atraumatic.  Neck: Normal range of motion. Neck supple. No thyromegaly present.  Normal carotid pulses  Cardiovascular:  RRR, no murmur, rub or gallop  Pulmonary/Chest: Effort normal. No stridor. No respiratory distress.  CTA B  Abdominal: Soft. Bowel sounds are normal. He exhibits no distension and no mass. There is no tenderness. There is no rebound and no guarding.  Reducible umbilical hernia noted.  Musculoskeletal: Normal range of motion. He exhibits no edema or tenderness.  Lymphadenopathy:    He has no cervical adenopathy.  Neurological: He is alert and oriented to person, place, and time. No cranial nerve deficit. He exhibits normal muscle tone. Coordination normal.  Speech normal, gait unassisted and normal for age, motor strength appropriate for age   Skin: Skin is warm and dry. No pallor.  No jaundice  Psychiatric: He has a normal mood and affect. His behavior is normal. Judgment and thought content normal.  Vitals reviewed.        Assessment & Plan:   Problem List Items  Addressed This Visit      Cardiovascular and Mediastinum   Essential hypertension - Primary    BP slightly elevated, BP recheck 142/82. Occasionally has high readings at home. Plan: Increase Ziac  5-6.25 to --------> 10-6.25 and monitor BPs      Relevant Medications   sildenafil (VIAGRA) 100 MG tablet   ZIAC 10-6.25 MG PO  TABS   Other Relevant Orders   EKG 12-Lead (Completed)   Basic metabolic panel   CBC with Differential/Platelet   TSH     Other   Hyperlipidemia    Good compliance of medication, labs, see instructions      Relevant Medications   sildenafil (VIAGRA) 100 MG tablet   ZIAC 10-6.25 MG PO TABS   Other Relevant Orders   AST   ALT   Lipid panel   Annual physical exam    Td 2009 Pneumonia shot 2011 Prevnar 2014 Had a flu shot Zostavax prescription provided before  Colonoscopy 06-2014, had polyps, next per GI Prostate cancer screening per urology.  Diet and exercise discussed      Elevated PSA, less than 10 ng/ml    Per Urology      Umbilical hernia    Again red flag sx discussed

## 2014-07-31 NOTE — Assessment & Plan Note (Signed)
Again red flag sx discussed

## 2014-08-01 ENCOUNTER — Telehealth: Payer: Self-pay | Admitting: Internal Medicine

## 2014-08-01 ENCOUNTER — Other Ambulatory Visit (INDEPENDENT_AMBULATORY_CARE_PROVIDER_SITE_OTHER): Payer: Medicare Other

## 2014-08-01 DIAGNOSIS — E785 Hyperlipidemia, unspecified: Secondary | ICD-10-CM

## 2014-08-01 DIAGNOSIS — I10 Essential (primary) hypertension: Secondary | ICD-10-CM

## 2014-08-01 LAB — LIPID PANEL
Cholesterol: 156 mg/dL (ref 0–200)
HDL: 43.9 mg/dL (ref >39.00)
LDL Cholesterol: 96 mg/dL (ref 0–99)
NonHDL: 112.1
Total CHOL/HDL Ratio: 4
Triglycerides: 82 mg/dL (ref 0.0–149.0)
VLDL: 16.4 mg/dL (ref 0.0–40.0)

## 2014-08-01 LAB — CBC WITH DIFFERENTIAL/PLATELET
Basophils Absolute: 0 10*3/uL (ref 0.0–0.1)
Basophils Relative: 0.3 % (ref 0.0–3.0)
Eosinophils Absolute: 0.1 10*3/uL (ref 0.0–0.7)
Eosinophils Relative: 1.6 % (ref 0.0–5.0)
HCT: 43 % (ref 39.0–52.0)
Hemoglobin: 14.2 g/dL (ref 13.0–17.0)
Lymphocytes Relative: 37.4 % (ref 12.0–46.0)
Lymphs Abs: 2.9 10*3/uL (ref 0.7–4.0)
MCHC: 33 g/dL (ref 30.0–36.0)
MCV: 82.5 fl (ref 78.0–100.0)
Monocytes Absolute: 0.6 10*3/uL (ref 0.1–1.0)
Monocytes Relative: 8.4 % (ref 3.0–12.0)
Neutro Abs: 4 10*3/uL (ref 1.4–7.7)
Neutrophils Relative %: 52.3 % (ref 43.0–77.0)
Platelets: 331 10*3/uL (ref 150.0–400.0)
RBC: 5.2 Mil/uL (ref 4.22–5.81)
RDW: 14.8 % (ref 11.5–15.5)
WBC: 7.7 10*3/uL (ref 4.0–10.5)

## 2014-08-01 LAB — BASIC METABOLIC PANEL
BUN: 13 mg/dL (ref 6–23)
CO2: 27 mEq/L (ref 19–32)
Calcium: 9.4 mg/dL (ref 8.4–10.5)
Chloride: 104 mEq/L (ref 96–112)
Creatinine, Ser: 0.94 mg/dL (ref 0.40–1.50)
GFR: 102.05 mL/min (ref >60.00)
Glucose, Bld: 101 mg/dL — ABNORMAL HIGH (ref 70–99)
Potassium: 3.8 mEq/L (ref 3.5–5.1)
Sodium: 138 mEq/L (ref 135–145)

## 2014-08-01 LAB — AST: AST: 22 U/L (ref 0–37)

## 2014-08-01 LAB — ALT: ALT: 20 U/L (ref 0–53)

## 2014-08-01 LAB — TSH: TSH: 1.13 u[IU]/mL (ref 0.35–4.50)

## 2014-08-01 MED ORDER — ZOSTER VACCINE LIVE 19400 UNT/0.65ML ~~LOC~~ SOLR: 0.6500 mL | Freq: Once | SUBCUTANEOUS | Status: DC

## 2014-08-01 NOTE — Telephone Encounter (Signed)
Print a prescription, he can get it at his local pharmacy.

## 2014-08-01 NOTE — Telephone Encounter (Signed)
done

## 2014-08-01 NOTE — Telephone Encounter (Signed)
Please advise 

## 2014-08-01 NOTE — Telephone Encounter (Signed)
Spoke with Pts wife, informed her to inform Pt, okay to receive shingles shot. Requested Rx to [facility] directly.

## 2014-08-01 NOTE — Telephone Encounter (Signed)
Caller name: Yotam Relation to pt: Call back number: [phone] or [phone] Pharmacy:  Reason for call: Pt stated came in office yesterday for a Dr. Visit and asked if he can have a shingle shot, pt already verified with insurance and that insurance will cover it. Please advise.

## 2014-08-14 ENCOUNTER — Telehealth: Payer: Self-pay

## 2014-08-14 NOTE — Telephone Encounter (Signed)
Received OV notes from [facility] dated 08/04/2014, per Dr. Louis Meckel he is awaiting PSA results from Dr. Larose Kells. Faxed last PSA results which was dated 05/10/2013, informed him PSA has not been checked by [place] since then.

## 2014-09-18 ENCOUNTER — Other Ambulatory Visit: Payer: Self-pay

## 2014-09-18 DIAGNOSIS — I10 Essential (primary) hypertension: Secondary | ICD-10-CM

## 2014-09-18 MED ORDER — BISOPROLOL-HYDROCHLOROTHIAZIDE 10-6.25 MG PO TABS: 1.0000 | ORAL_TABLET | Freq: Every day | ORAL | Status: DC

## 2014-09-27 ENCOUNTER — Other Ambulatory Visit: Payer: Self-pay

## 2014-09-29 ENCOUNTER — Other Ambulatory Visit: Payer: Self-pay | Admitting: Internal Medicine

## 2014-09-29 MED ORDER — MECLIZINE HCL 25 MG PO TABS: 25.0000 mg | ORAL_TABLET | ORAL | Status: DC | PRN

## 2014-09-29 NOTE — Telephone Encounter (Signed)
Left message for patient to return my call.

## 2014-09-29 NOTE — Telephone Encounter (Signed)
Caller name: Relation to pt: self  Call back number: Best # [phone] Pharmacy:  Reason for call:  Pt states Dr. Hermelinda Medicus prescribed Antivert and he stated he told MD all this at he's last CPE. Pt also stated he is requesting a refill of he's dizzy medication does not know the name of RX. Please advise pt states please call mobile number he is at work [phone]

## 2014-09-29 NOTE — Telephone Encounter (Signed)
[facility]is[facility]rolol refills [facility] [address] on 09/18/2014 # 90 and 3 refills. Do not see in chart where Dr. Larose Kells has ever refilled Antivert, original prescriber will need to fill.

## 2014-09-29 NOTE — Telephone Encounter (Signed)
Pt requesting refill on Antivert, never Rxed by Dr. Larose Kells, [place] states he informed Dr. Larose Kells of all medications at visit, okay to refill or will he need to wait for Dr. Larose Kells return?

## 2014-09-29 NOTE — Telephone Encounter (Signed)
Pt not currently having symptoms, wanting medication to have on hand, send # 10 tabs and 0 [facility]ills [facility].

## 2014-09-29 NOTE — Telephone Encounter (Signed)
Clarify if patient is having symptoms now. If so then prescribe #10 tabs without refill at same sig to cover him til PMD returns if no symptoms can wait for PMD to decide

## 2014-09-29 NOTE — Telephone Encounter (Signed)
Caller name: Caylin Relation to pt: self Call back number: [phone] or [phone] [facility] [address]  Reason for call:   Requesting a 90 day supply of bisoprolol. Also, requesting antivert refill

## 2014-10-02 ENCOUNTER — Telehealth: Payer: Self-pay | Admitting: Internal Medicine

## 2014-10-02 ENCOUNTER — Other Ambulatory Visit: Payer: Self-pay

## 2014-10-02 DIAGNOSIS — I10 Essential (primary) hypertension: Secondary | ICD-10-CM

## 2014-10-02 MED ORDER — BISOPROLOL-HYDROCHLOROTHIAZIDE 10-6.25 MG PO TABS: 1.0000 | ORAL_TABLET | Freq: Every day | ORAL | Status: DC

## 2014-10-02 NOTE — Telephone Encounter (Signed)
[facility] to [facility]

## 2014-10-02 NOTE — Telephone Encounter (Signed)
Caller name: Shykeem Relation to pt: self Call back number: [phone] [facility] [address]   Reason for call:   Stating that pharmacy told him that they did not receive bisoprolol refill. Please resend

## 2015-01-12 ENCOUNTER — Other Ambulatory Visit: Payer: Self-pay | Admitting: Internal Medicine

## 2015-01-29 ENCOUNTER — Ambulatory Visit (INDEPENDENT_AMBULATORY_CARE_PROVIDER_SITE_OTHER): Payer: Medicare Other | Admitting: Internal Medicine

## 2015-01-29 ENCOUNTER — Encounter: Payer: Self-pay | Admitting: Internal Medicine

## 2015-01-29 VITALS — BP 128/76 | HR 71 | Temp 98.1°F | Ht 71.0 in | Wt 204.0 lb

## 2015-01-29 DIAGNOSIS — R42 Dizziness and giddiness: Secondary | ICD-10-CM | POA: Diagnosis not present

## 2015-01-29 DIAGNOSIS — I10 Essential (primary) hypertension: Secondary | ICD-10-CM | POA: Diagnosis not present

## 2015-01-29 MED ORDER — MECLIZINE HCL 25 MG PO TABS
25.0000 mg | ORAL_TABLET | ORAL | Status: DC | PRN
Start: 2015-01-29 — End: 2016-03-28

## 2015-01-29 MED ORDER — ZOSTER VACCINE LIVE 19400 UNT/0.65ML ~~LOC~~ SOLR: 0.6500 mL | Freq: Once | SUBCUTANEOUS | Status: DC

## 2015-01-29 MED ORDER — ZOSTER VACCINE LIVE 19400 UNT/0.65ML ~~LOC~~ SOLR
0.6500 mL | Freq: Once | SUBCUTANEOUS | Status: DC
Start: 2015-01-29 — End: 2015-08-03

## 2015-01-29 NOTE — Assessment & Plan Note (Signed)
6 months ago we increase his blood pressure medication, no side effects, no ambulatory BPs at BP today is very good. Plan: Continue with same meds, check a BMP

## 2015-01-29 NOTE — Progress Notes (Signed)
Pre visit review using our clinic review tool, if applicable. No additional management support is needed unless otherwise documented below in the visit note. 

## 2015-01-29 NOTE — Assessment & Plan Note (Signed)
Reports occasional dizziness for many years, previous PCP prescribed meclizine. Prescription refill.

## 2015-01-29 NOTE — Patient Instructions (Signed)
Get your blood work before you leave   Come back for the removal of a skin lesion at your convenience

## 2015-01-29 NOTE — Progress Notes (Signed)
Subjective:    Patient ID: Marcus Beasley, male    DOB: Dec 22, 1945, 69 y.o.   MRN: [id number]  DOS:  01/29/2015 Type of visit - description : Routine visit Interval history:  Hypertension, we adjusted his medication the last time he was here, good compliance, no apparent side effects, not ambulatory BPs. BP today is very good Has a skin lesion at the back, causing some discomfort. Occasional dizziness for years, has antivert which he uses seldom. Request a refill  Review of Systems  Denies chest pain or difficulty breathing No diarrhea or blood in the stools. Occasional vomiting nausea without vomiting  Past Medical History  Diagnosis Date  . HTN (hypertension)   . BPH (benign prostatic hyperplasia)     (-) Bx 2015  . Hyperlipidemia     Past Surgical History  Procedure Laterality Date  . Abcess drainage      abdomen- 26 day hospitalization 1968  . Hernia repair  summer '11    umbilical  . Prostate biopsy  06-2013    (-)    History   Social History  . Marital Status: Married    Spouse Name: N/A  . Number of Children: 3  . Years of Education: 14   Occupational History  . [employer], still works    .     Social History Main Topics  . Smoking status: Never Smoker   . Smokeless tobacco: Never Used  . Alcohol Use: 0.5 oz/week    1 Standard drinks or equivalent per week     Comment: beer  . Drug Use: No  . Sexual Activity:    Partners: Female   Other Topics Concern  . 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 2 dtrs , 1 son - homicide. 5 grandchildren. 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        Medication List       This list is accurate as of: 01/29/15 11:59 PM.  Always use your most recent med list.               aspirin 325 MG tablet  Take 325 mg by mouth daily.     atorvastatin 20 MG tablet  Commonly known as:  LIPITOR  Take 1 tablet (20 mg total) by mouth daily.     bisoprolol-hydrochlorothiazide 10-6.25 MG per tablet  Commonly known as:  ZIAC  Take 1 tablet by mouth daily.     meclizine 25 MG tablet  Commonly known as:  ANTIVERT  Take 1 tablet (25 mg total) by mouth as needed for dizziness.     multivitamin-iron-minerals-folic acid chewable tablet  Chew 1 tablet by mouth daily.     sildenafil 100 MG tablet  Commonly known as:  VIAGRA  Take 100 mg by mouth as needed for erectile dysfunction.     zoster vaccine live (PF) 19400 UNT/0.65ML injection  Commonly known as:  ZOSTAVAX  Inject 19,400 Units into the skin once.           Objective:   Physical Exam  Skin:      BP 128/76 mmHg  Pulse 71  Temp(Src) 98.1 F (36.7 C) (Oral)  Ht 5\' 11"  (1.803 m)  Wt 204 lb (92.534 kg)  BMI 28.46 kg/m2  SpO2 97% General:   Well developed, well nourished . NAD.  HEENT:  Normocephalic . Face symmetric, atraumatic Lungs:  CTA B Normal respiratory effort, no intercostal retractions, no  accessory muscle use. Heart: RRR,  no murmur.  No pretibial edema bilaterally  Neurologic:  alert & oriented X3.  Speech normal, gait appropriate for age and unassisted Psych--  Cognition and judgment appear intact.  Cooperative with normal attention span and concentration.  Behavior appropriate. No anxious or depressed appearing.      Assessment & Plan:   Skin lesions: The polyp like lesion causing some discomfort, recommend to come back at his convenience for excision. He also has a cyst, recommend observation.

## 2015-01-30 LAB — BASIC METABOLIC PANEL
BUN: 16 mg/dL (ref 6–23)
CO2: 23 mEq/L (ref 19–32)
Calcium: 9.2 mg/dL (ref 8.4–10.5)
Chloride: 105 mEq/L (ref 96–112)
Creatinine, Ser: 0.99 mg/dL (ref 0.40–1.50)
GFR: 95.99 mL/min (ref >60.00)
Glucose, Bld: 124 mg/dL — ABNORMAL HIGH (ref 70–99)
Potassium: 3.6 mEq/L (ref 3.5–5.1)
Sodium: 138 mEq/L (ref 135–145)

## 2015-04-06 ENCOUNTER — Other Ambulatory Visit: Payer: Self-pay | Admitting: Internal Medicine

## 2015-07-23 ENCOUNTER — Telehealth: Payer: Self-pay | Admitting: Internal Medicine

## 2015-07-23 NOTE — Telephone Encounter (Signed)
Spouse stated patient had flu shot in 03/2015 from his employer [employer]

## 2015-07-24 ENCOUNTER — Encounter: Payer: Self-pay | Admitting: Internal Medicine

## 2015-07-24 ENCOUNTER — Ambulatory Visit (INDEPENDENT_AMBULATORY_CARE_PROVIDER_SITE_OTHER): Payer: BLUE CROSS/BLUE SHIELD | Admitting: Internal Medicine

## 2015-07-24 VITALS — BP 116/74 | HR 75 | Temp 98.0°F | Ht 71.0 in | Wt 206.2 lb

## 2015-07-24 DIAGNOSIS — L989 Disorder of the skin and subcutaneous tissue, unspecified: Secondary | ICD-10-CM | POA: Diagnosis not present

## 2015-07-24 NOTE — Progress Notes (Signed)
Subjective:    Patient ID: Marcus Beasley, male    DOB: 1946/01/13, 70 y.o.   MRN: [id number]  DOS:  07/24/2015 Type of visit - description : Acute visit, skin lesions.   Interval history: Has two skin problems in the back, one place continue draining  sebaceous material from time to time, the other is a skin lesion, he likes it to be excised.   Review of Systems   Past Medical History  Diagnosis Date  . HTN (hypertension)   . BPH (benign prostatic hyperplasia)     (-) Bx 2015  . Hyperlipidemia   . Elevated PSA     Prostate Bx in 06/2013 was benign    Past Surgical History  Procedure Laterality Date  . Abcess drainage      abdomen- 26 day hospitalization 1968  . Hernia repair  summer '11    umbilical  . Prostate biopsy  06-2013    (-)    Social History   Social History  . Marital Status: Married    Spouse Name: N/A  . Number of Children: 3  . Years of Education: 14   Occupational History  . [employer], still works    .     Social History Main Topics  . Smoking status: Never Smoker   . Smokeless tobacco: Never Used  . Alcohol Use: 0.5 oz/week    1 Standard drinks or equivalent per week     Comment: beer  . Drug Use: No  . Sexual Activity:    Partners: Female   Other Topics Concern  . 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 2 dtrs , 1 son - homicide. 5 grandchildren. 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        Medication List       This list is accurate as of: 07/24/15 11:59 PM.  Always use your most recent med list.               aspirin 325 MG tablet  Take 325 mg by mouth daily.     atorvastatin 20 MG tablet  Commonly known as:  LIPITOR  Take 1 tablet (20 mg total) by mouth daily.     bisoprolol-hydrochlorothiazide 10-6.25 MG tablet  Commonly known as:  ZIAC  Take 1 tablet by mouth daily.     bisoprolol-hydrochlorothiazide 5-6.25 MG tablet  Commonly known as:  ZIAC  Take 1 tablet  by mouth daily.     meclizine 25 MG tablet  Commonly known as:  ANTIVERT  Take 1 tablet (25 mg total) by mouth as needed for dizziness.     multivitamin-iron-minerals-folic acid chewable tablet  Chew 1 tablet by mouth daily.     sildenafil 100 MG tablet  Commonly known as:  VIAGRA  Take 100 mg by mouth as needed for erectile dysfunction.     tamsulosin 0.4 MG Caps capsule  Commonly known as:  FLOMAX  Take 1 capsule (0.4 mg total) by mouth daily.     zoster vaccine live (PF) 19400 UNT/0.65ML injection  Commonly known as:  ZOSTAVAX  Inject 19,400 Units into the skin once.           Objective:   Physical Exam  Constitutional: He is oriented to person, place, and time. He appears well-developed and well-nourished. No distress.  Neurological: He is alert and oriented to person, place, and time.  Skin: He is not diaphoretic.  Psychiatric: He has a normal mood and affect. His behavior is normal. Judgment and thought content normal.   BP 116/74 mmHg  Pulse 75  Temp(Src) 98 F (36.7 C) (Oral)  Ht 5\' 11"  (1.803 m)  Wt 206 lb 4 oz (93.554 kg)  BMI 28.78 kg/m2  SpO2 98%     Assessment & Plan:   Assessment HTN Hyperlipidemia BPH, increased PSA, BX (-) -2015  Plan: Sebaceous cyst- Continue observation Skin lesion: refer to dr Allyson Sabal [facility]-- has an appoint schedule for CPX

## 2015-07-24 NOTE — Telephone Encounter (Signed)
Flu shot updated. 

## 2015-07-24 NOTE — Progress Notes (Signed)
Pre visit review using our clinic review tool, if applicable. No additional management support is needed unless otherwise documented below in the visit note. 

## 2015-07-25 ENCOUNTER — Encounter: Payer: Self-pay | Admitting: Internal Medicine

## 2015-08-03 ENCOUNTER — Telehealth: Payer: Self-pay | Admitting: *Deleted

## 2015-08-03 ENCOUNTER — Encounter: Payer: Self-pay | Admitting: *Deleted

## 2015-08-03 NOTE — Telephone Encounter (Signed)
Unable to reach patient at time of pre-visit call. Left message w/ pt's wife for patient to return call when available.

## 2015-08-03 NOTE — Telephone Encounter (Signed)
Pre-Visit Call completed with patient and chart updated.   Pre-Visit Info documented in Specialty Comments under SnapShot.    

## 2015-08-06 ENCOUNTER — Ambulatory Visit (INDEPENDENT_AMBULATORY_CARE_PROVIDER_SITE_OTHER): Payer: BLUE CROSS/BLUE SHIELD | Admitting: Internal Medicine

## 2015-08-06 ENCOUNTER — Encounter: Payer: Self-pay | Admitting: Internal Medicine

## 2015-08-06 VITALS — BP 122/74 | HR 67 | Temp 98.2°F | Ht 71.0 in | Wt 206.4 lb

## 2015-08-06 DIAGNOSIS — Z0000 Encounter for general adult medical examination without abnormal findings: Secondary | ICD-10-CM | POA: Diagnosis not present

## 2015-08-06 DIAGNOSIS — Z09 Encounter for follow-up examination after completed treatment for conditions other than malignant neoplasm: Secondary | ICD-10-CM

## 2015-08-06 DIAGNOSIS — I10 Essential (primary) hypertension: Secondary | ICD-10-CM | POA: Diagnosis not present

## 2015-08-06 DIAGNOSIS — E785 Hyperlipidemia, unspecified: Secondary | ICD-10-CM

## 2015-08-06 DIAGNOSIS — R739 Hyperglycemia, unspecified: Secondary | ICD-10-CM | POA: Diagnosis not present

## 2015-08-06 NOTE — Assessment & Plan Note (Addendum)
Td 2009;Pneumonia shot 2011; Prevnar 2014; zostavax 05-2015; Had a flu shot   Colonoscopy 06-2014, had polyps, next per GI (3 years) Prostate cancer screening per urology.    Diet and exercise discussed

## 2015-08-06 NOTE — Progress Notes (Signed)
Pre visit review using our clinic review tool, if applicable. No additional management support is needed unless otherwise documented below in the visit note. 

## 2015-08-06 NOTE — Patient Instructions (Addendum)
Get your blood work before you leave   Please consider visit these websites for more information:  www.begintheconversation.org  theconversationproject.org   NEXT VISIT IN  8 MONTHS , ROUTINE CHECK UP    Fall Prevention and Home Safety Falls cause injuries and can affect all age groups. It is possible to use preventive measures to significantly decrease the likelihood of falls. There are many simple measures which can make your home safer and prevent falls. OUTDOORS  Repair cracks and edges of walkways and driveways  Remove high doorway thresholds.  Trim shrubbery on the main path into your home.  Have good outside lighting.  Clear walkways of tools, rocks, debris, and clutter.  Check that handrails are not broken and are securely fastened. Both sides of steps should have handrails.  Have leaves, snow, and ice cleared regularly.  Use sand or salt on walkways during winter months.  In the garage, clean up grease or oil spills. BATHROOM  Install night lights.  Install grab bars by the toilet and in the tub and shower.  Use non-skid mats or decals in the tub or shower.  Place a plastic non-slip stool in the shower to sit on, if needed.  Keep floors dry and clean up all water on the floor immediately.  Remove soap buildup in the tub or shower on a regular basis.  Secure bath mats with non-slip, double-sided rug tape.  Remove throw rugs and tripping hazards from the floors. BEDROOMS  Install night lights.  Make sure a bedside light is easy to reach.  Do not use oversized bedding.  Keep a telephone by your bedside.  Have a firm chair with side arms to use for getting dressed.  Remove throw rugs and tripping hazards from the floor. KITCHEN  Keep handles on pots and pans turned toward the center of the stove. Use back burners when possible.  Clean up spills quickly and allow time for drying.  Avoid walking on wet floors.  Avoid hot utensils and  knives.  Position shelves so they are not too high or low.  Place commonly used objects within easy reach.  If necessary, use a sturdy step stool with a grab bar when reaching.  Keep electrical cables out of the way.  Do not use floor polish or wax that makes floors slippery. If you must use wax, use non-skid floor wax.  Remove throw rugs and tripping hazards from the floor. STAIRWAYS  Never leave objects on stairs.  Place handrails on both sides of stairways and use them. Fix any loose handrails. Make sure handrails on both sides of the stairways are as long as the stairs.  Check carpeting to make sure it is firmly attached along stairs. Make repairs to worn or loose carpet promptly.  Avoid placing throw rugs at the top or bottom of stairways, or properly secure the rug with carpet tape to prevent slippage. Get rid of throw rugs, if possible.  Have an electrician put in a light switch at the top and bottom of the stairs. OTHER FALL PREVENTION TIPS  Wear low-heel or rubber-soled shoes that are supportive and fit well. Wear closed toe shoes.  When using a stepladder, make sure it is fully opened and both spreaders are firmly locked. Do not climb a closed stepladder.  Add color or contrast paint or tape to grab bars and handrails in your home. Place contrasting color strips on first and last steps.  Learn and use mobility aids as needed. Install an Dealer  emergency response system.  Turn on lights to avoid dark areas. Replace light bulbs that burn out immediately. Get light switches that glow.  Arrange furniture to create clear pathways. Keep furniture in the same place.  Firmly attach carpet with non-skid or double-sided tape.  Eliminate uneven floor surfaces.  Select a carpet pattern that does not visually hide the edge of steps.  Be aware of all pets. OTHER HOME SAFETY TIPS  Set the water temperature for 120 F (48.8 C).  Keep emergency numbers on or near the  telephone.  Keep smoke detectors on every level of the home and near sleeping areas. Document Released: 05/30/2002 Document Revised: 12/09/2011 Document Reviewed: 08/29/2011 [facility] Patient Information 2015 [place], [place]. This information is not intended to replace advice given to you by your health care provider. Make sure you discuss any questions you have with your health care provider.   Preventive Care for Adults Ages 12 and over  Blood pressure check.** / Every 1 to 2 years.  Lipid and cholesterol check.**/ Every 5 years beginning at age 45.  Lung cancer screening. / Every year if you are aged 84-80 years and have a 30-pack-year history of smoking and currently smoke or have quit within the past 15 years. Yearly screening is stopped once you have quit smoking for at least 15 years or develop a health problem that would prevent you from having lung cancer treatment.  Fecal occult blood test (FOBT) of stool. / Every year beginning at age 13 and continuing until age 72. You may not have to do this test if you get a colonoscopy every 10 years.  Flexible sigmoidoscopy** or colonoscopy.** / Every 5 years for a flexible sigmoidoscopy or every 10 years for a colonoscopy beginning at age 9 and continuing until age 34.  Hepatitis C blood test.** / For all people born from [birth year] through 1966 and any individual with known risks for hepatitis C.  Abdominal aortic aneurysm (AAA) screening.** / A one-time screening for ages 5 to 8 years who are current or former smokers.  Skin self-exam. / Monthly.  Influenza vaccine. / Every year.  Tetanus, diphtheria, and acellular pertussis (Tdap/Td) vaccine.** / 1 dose of Td every 10 years.  Varicella vaccine.** / Consult your health care provider.  Zoster vaccine.** / 1 dose for adults aged 7 years or older.  Pneumococcal 13-valent conjugate (PCV13) vaccine.** / Consult your health care provider.  Pneumococcal polysaccharide (PPSV23) vaccine.**  / 1 dose for all adults aged 11 years and older.  Meningococcal vaccine.** / Consult your health care provider.  Hepatitis A vaccine.** / Consult your health care provider.  Hepatitis B vaccine.** / Consult your health care provider.  Haemophilus influenzae type b (Hib) vaccine.** / Consult your health care provider. **Family history and personal history of risk and conditions may change your health care provider's recommendations. Document Released: 08/05/2001 Document Revised: 06/14/2013 Document Reviewed: 11/04/2010 [facility] Patient Information 2015 [place], [place]. This information is not intended to replace advice given to you by your health care provider. Make sure you discuss any questions you have with your health care provider.

## 2015-08-06 NOTE — Progress Notes (Signed)
Subjective:    Patient ID: Marcus Beasley, male    DOB: May 31, 1946, 70 y.o.   MRN: [id number]  DOS:  08/06/2015 Type of visit - description :   Here for Medicare AWV:  1. Risk factors based on Past M, S, F history: reviewed 2. Physical Activities:  Takes walks occasionally, still works 40 h/week   3. Depression/mood: neg screening   4. Hearing:  No problems noted or reported   5. ADL's: independent, drives   6. Fall Risk: no recent falls, prevention discussed   7. home Safety: does feel safe at home   8. Height, weight, & visual acuity: see VS, sees eye doctor regulalrly 9. Counseling: provided 10. Labs ordered based on risk factors: if needed   11. Referral Coordination: if needed 12. Care Plan, see assessment and plan , written personalized plan provided   13. Cognitive Assessment: motor skills and cognition appropriate for age 53. Care team updated-- updated 15. HC-POA discussed   In addition, today we discussed the following:  BPH: oligosymptomatic, due to see the urologist HTN: Good compliance of medication, no ambulatory BPs. BP today is very good  dyslipidemia: on  statins, no apparent side effects    Review of Systems Constitutional: No fever. No chills. No unexplained wt changes. No unusual sweats  HEENT: No dental problems, no ear discharge, no facial swelling, no voice changes. No eye discharge, no eye  redness , no  intolerance to light   Respiratory: No wheezing , no  difficulty breathing. No cough , no mucus production  Cardiovascular: No CP, no leg swelling , no  Palpitations  GI: no nausea, no vomiting, no diarrhea , no  abdominal pain.  No blood in the stools. No dysphagia, no odynophagia    Endocrine: No polyphagia, no polyuria , no polydipsia  GU: No dysuria, gross hematuria, difficulty urinating.  .Occasionally urinary urgency without other symptoms, at baseline, sx not progresing.   Musculoskeletal: No joint swellings or unusual aches or  pains  Skin: No change in the color of the skin, palor , no  Rash  Allergic, immunologic: No environmental allergies , no  food allergies  Neurological: No dizziness no  syncope. No headaches. No diplopia, no slurred, no slurred speech, no motor deficits, no facial  Numbness  Hematological: No enlarged lymph nodes, no easy bruising , no unusual bleedings  Psychiatry: No suicidal ideas, no hallucinations, no beavior problems, no confusion.  No unusual/severe anxiety, no depression    Past Medical History  Diagnosis Date  . HTN (hypertension)   . BPH (benign prostatic hyperplasia)     (-) Bx 2015  . Hyperlipidemia   . Elevated PSA     Prostate Bx in 06/2013 was benign    Past Surgical History  Procedure Laterality Date  . Abcess drainage      abdomen- 26 day hospitalization 1968  . Hernia repair  summer '11    umbilical  . Prostate biopsy  06-2013    (-)    Social History   Social History  . Marital Status: Married    Spouse Name: N/A  . Number of Children: 3  . Years of Education: 14   Occupational History  . [place], still works    .     Social History Main Topics  . Smoking status: Never Smoker   . Smokeless tobacco: Never Used  . Alcohol Use: 0.5 oz/week    1 Standard drinks or equivalent per week  Comment: beer  . Drug Use: No  . Sexual Activity:    Partners: Female   Other Topics Concern  . 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 2 dtrs , 1 son - homicide. 5 grandchildren. 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     Family History  Problem Relation Age of Onset  . Leukemia Mother   . Heart attack Father     MI age 20  . Diabetes Maternal Grandmother   . Heart disease Sister     age 67  . Cancer - Other Sister     type  . Heart disease Brother     age 61  . Kidney disease Brother     HD  . Prostate cancer Neg Hx   . Colon cancer Neg Hx   . Esophageal cancer Neg Hx   . Rectal cancer  Neg Hx   . Stomach cancer Neg Hx        Medication List       This list is accurate as of: 08/06/15  3:17 PM.  Always use your most recent med list.               aspirin 325 MG tablet  Take 325 mg by mouth daily.     atorvastatin 20 MG tablet  Commonly known as:  LIPITOR  Take 1 tablet (20 mg total) by mouth daily.     bisoprolol-hydrochlorothiazide 10-6.25 MG tablet  Commonly known as:  ZIAC  Take 1 tablet by mouth daily.     bisoprolol-hydrochlorothiazide 5-6.25 MG tablet  Commonly known as:  ZIAC  Take 1 tablet by mouth daily.     meclizine 25 MG tablet  Commonly known as:  ANTIVERT  Take 1 tablet (25 mg total) by mouth as needed for dizziness.     multivitamin-iron-minerals-folic acid chewable tablet  Chew 1 tablet by mouth daily.     sildenafil 100 MG tablet  Commonly known as:  VIAGRA  Take 100 mg by mouth as needed for erectile dysfunction.           Objective:   Physical Exam BP 122/74 mmHg  Pulse 67  Temp(Src) 98.2 F (36.8 C) (Oral)  Ht 5\' 11"  (1.803 m)  Wt 206 lb 6 oz (93.611 kg)  BMI 28.80 kg/m2  SpO2 96% General:   Well developed, well nourished . NAD.  Neck:   No  thyromegaly , normal carotid pulse HEENT:  Normocephalic . Face symmetric, atraumatic Lungs:  CTA B Normal respiratory effort, no intercostal retractions, no accessory muscle use. Heart: RRR,  no murmur.  No pretibial edema bilaterally  Abdomen:  Not distended, soft, non-tender. No rebound or rigidity.   Skin: Exposed areas without rash. Not pale. Not jaundice Neurologic:  alert & oriented X3.  Speech normal, gait appropriate for age and unassisted Strength symmetric and appropriate for age.  Psych: Cognition and judgment appear intact.  Cooperative with normal attention span and concentration.  Behavior appropriate. No anxious or depressed appearing.    Assessment & Plan:   Assessment HTN Hyperlipidemia BPH, increased PSA, BX (-) -2015  PLAN  HTN:  Well-controlled, continue Ziac. High chol-- Continue Lipitor, check labs BPH: Due to see urology, plans to call them Mild hyperglycemia: Check A1c RTC 8 months

## 2015-08-06 NOTE — Assessment & Plan Note (Addendum)
HTN: Well-controlled, continue Ziac. High chol-- Continue Lipitor, check labs BPH: Due to see urology, plans to call them Mild hyperglycemia: Check A1c RTC 8 months

## 2015-08-07 LAB — BASIC METABOLIC PANEL
BUN: 16 mg/dL (ref 6–23)
CO2: 28 mEq/L (ref 19–32)
Calcium: 9.6 mg/dL (ref 8.4–10.5)
Chloride: 104 mEq/L (ref 96–112)
Creatinine, Ser: 1.04 mg/dL (ref 0.40–1.50)
GFR: 90.55 mL/min (ref >60.00)
Glucose, Bld: 104 mg/dL — ABNORMAL HIGH (ref 70–99)
Potassium: 4.1 mEq/L (ref 3.5–5.1)
Sodium: 140 mEq/L (ref 135–145)

## 2015-08-07 LAB — LIPID PANEL
Cholesterol: 164 mg/dL (ref 0–200)
HDL: 45.7 mg/dL (ref >39.00)
LDL Cholesterol: 102 mg/dL — ABNORMAL HIGH (ref 0–99)
NonHDL: 118.16
Total CHOL/HDL Ratio: 4
Triglycerides: 80 mg/dL (ref 0.0–149.0)
VLDL: 16 mg/dL (ref 0.0–40.0)

## 2015-08-07 LAB — HEMOGLOBIN A1C: Hgb A1c MFr Bld: 8 % — ABNORMAL HIGH (ref 4.6–6.5)

## 2015-08-07 LAB — ALT: ALT: 27 U/L (ref 0–53)

## 2015-08-07 LAB — AST: AST: 24 U/L (ref 0–37)

## 2015-08-09 MED ORDER — METFORMIN HCL 500 MG PO TABS: 500.0000 mg | ORAL_TABLET | Freq: Two times a day (BID) | ORAL | Status: DC

## 2015-08-09 NOTE — Addendum Note (Signed)
Addended byDamita Dunnings D on: 08/09/2015 03:58 PM   Modules accepted: Orders

## 2015-10-04 ENCOUNTER — Other Ambulatory Visit: Payer: Self-pay

## 2015-10-04 MED ORDER — METFORMIN HCL 500 MG PO TABS: 500.0000 mg | ORAL_TABLET | Freq: Two times a day (BID) | ORAL | Status: DC

## 2015-12-14 ENCOUNTER — Encounter: Payer: Self-pay | Admitting: Internal Medicine

## 2015-12-14 ENCOUNTER — Ambulatory Visit (INDEPENDENT_AMBULATORY_CARE_PROVIDER_SITE_OTHER): Payer: BLUE CROSS/BLUE SHIELD | Admitting: Internal Medicine

## 2015-12-14 VITALS — BP 124/80 | HR 77 | Temp 97.8°F | Ht 71.0 in | Wt 195.2 lb

## 2015-12-14 DIAGNOSIS — E119 Type 2 diabetes mellitus without complications: Secondary | ICD-10-CM | POA: Diagnosis not present

## 2015-12-14 LAB — BASIC METABOLIC PANEL
BUN: 18 mg/dL (ref 6–23)
CO2: 28 mEq/L (ref 19–32)
Calcium: 9.7 mg/dL (ref 8.4–10.5)
Chloride: 106 mEq/L (ref 96–112)
Creatinine, Ser: 0.9 mg/dL (ref 0.40–1.50)
GFR: 106.88 mL/min (ref >60.00)
Glucose, Bld: 112 mg/dL — ABNORMAL HIGH (ref 70–99)
Potassium: 4.6 mEq/L (ref 3.5–5.1)
Sodium: 141 mEq/L (ref 135–145)

## 2015-12-14 LAB — HEMOGLOBIN A1C: Hgb A1c MFr Bld: 6.6 % — ABNORMAL HIGH (ref 4.6–6.5)

## 2015-12-14 LAB — AST: AST: 19 U/L (ref 0–37)

## 2015-12-14 LAB — ALT: ALT: 19 U/L (ref 0–53)

## 2015-12-14 MED ORDER — GLUCOSE BLOOD VI STRP: ORAL_STRIP | Status: DC

## 2015-12-14 MED ORDER — METFORMIN HCL 850 MG PO TABS: 850.0000 mg | ORAL_TABLET | Freq: Two times a day (BID) | ORAL | Status: DC

## 2015-12-14 MED ORDER — ONETOUCH ULTRASOFT LANCETS MISC: Status: DC

## 2015-12-14 NOTE — Progress Notes (Signed)
Pre visit review using our clinic review tool, if applicable. No additional management support is needed unless otherwise documented below in the visit note. 

## 2015-12-14 NOTE — Progress Notes (Signed)
Subjective:    Patient ID: Marcus Beasley, male    DOB: [birth year], 70 y.o.   MRN: [id number]  DOS:  12/14/2015 Type of visit - description : rov Interval history: Dx w/ DM, started metformin , good medication compliance, no apparent side effects. Has changed his diet to some extent, eating less carbohydrates and sweets. No recent CBGs.    Review of Systems Denies nausea, vomiting, diarrhea  Past Medical History  Diagnosis Date  . HTN (hypertension)   . BPH (benign prostatic hyperplasia)     (-) Bx 2015  . Hyperlipidemia   . Elevated PSA     Prostate Bx in 06/2013 was benign    Past Surgical History  Procedure Laterality Date  . Abcess drainage      abdomen- 26 day hospitalization 1968  . Hernia repair  summer '11    umbilical, dr Ninfa Linden   . Prostate biopsy  06-2013    (-)    Social History   Social History  . Marital Status: Married    Spouse Name: N/A  . Number of Children: 3  . Years of Education: 14   Occupational History  . [place], still works    .     Social History Main Topics  . Smoking status: Never Smoker   . Smokeless tobacco: Never Used  . Alcohol Use: 0.5 oz/week    1 Standard drinks or equivalent per week     Comment: beer  . Drug Use: No  . Sexual Activity:    Partners: Female   Other Topics Concern  . 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 2 dtrs , 1 son - homicide. 5 grandchildren. 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        Medication List       This list is accurate as of: 12/14/15  4:56 PM.  Always use your most recent med list.               aspirin 325 MG tablet  Take 325 mg by mouth daily.     atorvastatin 20 MG tablet  Commonly known as:  LIPITOR  Take 1 tablet (20 mg total) by mouth daily.     bisoprolol-hydrochlorothiazide 5-6.25 MG tablet  Commonly known as:  ZIAC  Take 1 tablet by mouth daily.     glucose blood test strip  Check blood sugar no  more than twice daily     meclizine 25 MG tablet  Commonly known as:  ANTIVERT  Take 1 tablet (25 mg total) by mouth as needed for dizziness.     metFORMIN 850 MG tablet  Commonly known as:  GLUCOPHAGE  Take 1 tablet (850 mg total) by mouth 2 (two) times daily with a meal.     multivitamin-iron-minerals-folic acid chewable tablet  Chew 1 tablet by mouth daily.     onetouch ultrasoft lancets  Check blood sugar no more than twice daily.     sildenafil 100 MG tablet  Commonly known as:  VIAGRA  Take 100 mg by mouth as needed for erectile dysfunction. Reported on 12/14/2015           Objective:   Physical Exam BP 124/80 mmHg  Pulse 77  Temp(Src) 97.8 F (36.6 C) (Oral)  Ht 5\' 11"  (1.803 m)  Wt 195 lb 4 oz (88.565 kg)  BMI 27.24 kg/m2  SpO2 97% General:   Well developed, well nourished .  NAD.  HEENT:  Normocephalic . Face symmetric, atraumatic   Skin: Not pale. Not jaundice Neurologic:  alert & oriented X3.  Speech normal, gait appropriate for age and unassisted Psych--  Cognition and judgment appear intact.  Cooperative with normal attention span and concentration.  Behavior appropriate. No anxious or depressed appearing.      Assessment & Plan:   Assessment DM  ---->  DX 07-2015, A1c 8.0; started metformin HTN Hyperlipidemia BPH, increased PSA, BX (-) -2015  PLAN DM: discused pt dx of DM, started  metformin 500 mg twice a day, good compliance, tolerance, no apparent s/e. Explained the patient what is diabetes,  the concept of A1c. We had a long discussion about diet. Will check A1c, BMP LFTs, increase metformin to 850 mg twice a day.  Glucometer provided and CBG goals discussed. RTC 4 months

## 2015-12-14 NOTE — Assessment & Plan Note (Signed)
DM: discused pt dx of DM, started  metformin 500 mg twice a day, good compliance, tolerance, no apparent s/e. Explained the patient what is diabetes,  the concept of A1c. We had a long discussion about diet. Will check A1c, BMP LFTs, increase metformin to 850 mg twice a day.  Glucometer provided and CBG goals discussed. RTC 4 months

## 2015-12-14 NOTE — Patient Instructions (Addendum)
GO TO THE LAB : Get the blood work     GO TO THE FRONT DESK Schedule your next appointment for a  Routine check up in 4 months    Diabetes: Check your blood sugar  once a day  at different times of the day  GOALS: Fasting before a meal 70- 130 2 hours after a meal less than 180 At bedtime 90-150 Call if consistently not at goal

## 2015-12-18 ENCOUNTER — Telehealth: Payer: Self-pay | Admitting: Internal Medicine

## 2015-12-18 NOTE — Telephone Encounter (Signed)
PCP NOTES >>>>>>>>>>>>>>>>>>>>>>>>>>>>>>. - Colon Branch, MD at 12/14/2015 4:57 PM     Status: Written Related Problem: PCP NOTES >>>>>>>>>>>>>>>>>>>>>>>>>>>>>>.   Expand All Collapse All   DM: discused pt dx of DM, started metformin 500 mg twice a day, good compliance, tolerance, no apparent s/e. Explained the patient what is diabetes, the concept of A1c. We had a long discussion about diet. Will check A1c, BMP LFTs, increase metformin to 850 mg twice a day.  Glucometer provided and CBG goals discussed. RTC 4 months        Informed Pt of above that he should be taking Metformin 850mg  1 tablet bid, and informed him that his local pharmacy may have a drop box for the Metformin 500mg . Pt verbalized understanding. Next appt scheduled for 03/2016.

## 2015-12-18 NOTE — Telephone Encounter (Signed)
Pt called in to have clarity on a Rx. He says that he received a Rx for 500 mg and one for 800 mg. Pt is confused as to why 2 Rx's ?   Please call pt to advise directly.    Thanks.

## 2015-12-23 ENCOUNTER — Other Ambulatory Visit: Payer: Self-pay | Admitting: Internal Medicine

## 2015-12-24 NOTE — Telephone Encounter (Signed)
Refill sent per [facility] refill protocol/SLS  

## 2016-01-13 ENCOUNTER — Other Ambulatory Visit: Payer: Self-pay | Admitting: Internal Medicine

## 2016-01-15 ENCOUNTER — Other Ambulatory Visit: Payer: Self-pay | Admitting: Internal Medicine

## 2016-02-11 ENCOUNTER — Other Ambulatory Visit: Payer: Self-pay

## 2016-02-11 MED ORDER — METFORMIN HCL 850 MG PO TABS: 850.0000 mg | ORAL_TABLET | Freq: Two times a day (BID) | ORAL | 1 refills | Status: DC

## 2016-03-27 ENCOUNTER — Other Ambulatory Visit: Payer: Self-pay | Admitting: Internal Medicine

## 2016-03-28 ENCOUNTER — Other Ambulatory Visit: Payer: Self-pay | Admitting: Internal Medicine

## 2016-03-28 NOTE — Telephone Encounter (Signed)
Last seen 12/14/15 and filled 01/29/15 #30 with 3 refills.   Please advise     KP

## 2016-04-01 NOTE — Telephone Encounter (Signed)
Prescription for meclizine sent

## 2016-04-14 ENCOUNTER — Encounter: Payer: Self-pay | Admitting: Internal Medicine

## 2016-04-14 ENCOUNTER — Ambulatory Visit (INDEPENDENT_AMBULATORY_CARE_PROVIDER_SITE_OTHER): Payer: BLUE CROSS/BLUE SHIELD | Admitting: Internal Medicine

## 2016-04-14 VITALS — BP 124/78 | HR 67 | Temp 98.4°F | Resp 14 | Ht 71.0 in | Wt 194.2 lb

## 2016-04-14 DIAGNOSIS — E119 Type 2 diabetes mellitus without complications: Secondary | ICD-10-CM

## 2016-04-14 DIAGNOSIS — I10 Essential (primary) hypertension: Secondary | ICD-10-CM | POA: Diagnosis not present

## 2016-04-14 LAB — HEMOGLOBIN A1C: Hgb A1c MFr Bld: 6.4 % (ref 4.6–6.5)

## 2016-04-14 LAB — AST: AST: 20 U/L (ref 0–37)

## 2016-04-14 LAB — ALT: ALT: 18 U/L (ref 0–53)

## 2016-04-14 NOTE — Patient Instructions (Signed)
GO TO THE LAB : Get the blood work     GO TO THE FRONT DESK Schedule your next appointment for a  Physical exam for 07-2016   Diabetes and Foot Care Diabetes may cause you to have problems because of poor blood supply (circulation) to your feet and legs. This may cause the skin on your feet to become thinner, break easier, and heal more slowly. Your skin may become dry, and the skin may peel and crack. You may also have nerve damage in your legs and feet causing decreased feeling in them. You may not notice minor injuries to your feet that could lead to infections or more serious problems. Taking care of your feet is one of the most important things you can do for yourself.  HOME CARE INSTRUCTIONS  Wear shoes at all times, even in the house. Do not go barefoot. Bare feet are easily injured.  Check your feet daily for blisters, cuts, and redness. If you cannot see the bottom of your feet, use a mirror or ask someone for help.  Wash your feet with warm water (do not use hot water) and mild soap. Then pat your feet and the areas between your toes until they are completely dry. Do not soak your feet as this can dry your skin.  Apply a moisturizing lotion or petroleum jelly (that does not contain alcohol and is unscented) to the skin on your feet and to dry, brittle toenails. Do not apply lotion between your toes.  Trim your toenails straight across. Do not dig under them or around the cuticle. File the edges of your nails with an emery board or nail file.  Do not cut corns or calluses or try to remove them with medicine.  Wear clean socks or stockings every day. Make sure they are not too tight. Do not wear knee-high stockings since they may decrease blood flow to your legs.  Wear shoes that fit properly and have enough cushioning. To break in new shoes, wear them for just a few hours a day. This prevents you from injuring your feet. Always look in your shoes before you put them on to be sure  there are no objects inside.  Do not cross your legs. This may decrease the blood flow to your feet.  If you find a minor scrape, cut, or break in the skin on your feet, keep it and the skin around it clean and dry. These areas may be cleansed with mild soap and water. Do not cleanse the area with peroxide, alcohol, or iodine.  When you remove an adhesive bandage, be sure not to damage the skin around it.  If you have a wound, look at it several times a day to make sure it is healing.  Do not use heating pads or hot water bottles. They may burn your skin. If you have lost feeling in your feet or legs, you may not know it is happening until it is too late.  Make sure your health care provider performs a complete foot exam at least annually or more often if you have foot problems. Report any cuts, sores, or bruises to your health care provider immediately. SEEK MEDICAL CARE IF:   You have an injury that is not healing.  You have cuts or breaks in the skin.  You have an ingrown nail.  You notice redness on your legs or feet.  You feel burning or tingling in your legs or feet.  You have pain  or cramps in your legs and feet.  Your legs or feet are numb.  Your feet always feel cold. SEEK IMMEDIATE MEDICAL CARE IF:   There is increasing redness, swelling, or pain in or around a wound.  There is a red line that goes up your leg.  Pus is coming from a wound.  You develop a fever or as directed by your health care provider.  You notice a bad smell coming from an ulcer or wound.   This information is not intended to replace advice given to you by your health care provider. Make sure you discuss any questions you have with your health care provider.   Document Released: 06/06/2000 Document Revised: 02/09/2013 Document Reviewed: 11/16/2012 Elsevier Interactive Patient Education [employer].

## 2016-04-14 NOTE — Progress Notes (Signed)
Subjective:    Patient ID: Marcus Beasley, male    DOB: [birth year], 70 y.o.   MRN: [id number]  DOS:  04/14/2016 Type of visit - description : rov Interval history:  Reports he is doing well, continue with good medication compliance and watching his diet.   Review of Systems Denies nausea or vomiting. Occasionally has diarrhea that is mild and infrequent. No blood in the stools. When asked, admits to some burning feeling on the feet for the last few months but is mild and inconsistent. No numbness.  Past Medical History:  Diagnosis Date  . BPH (benign prostatic hyperplasia)    (-) Bx 2015  . Elevated PSA    Prostate Bx in 06/2013 was benign  . HTN (hypertension)   . Hyperlipidemia     Past Surgical History:  Procedure Laterality Date  . ABCESS DRAINAGE     abdomen- 26 day hospitalization 1968  . HERNIA REPAIR  summer '11   umbilical, dr Ninfa Linden   . PROSTATE BIOPSY  06-2013   (-)    Social History   Social History  . Marital status: Married    Spouse name: N/A  . Number of children: 3  . Years of education: 14   Occupational History  . [place], still works    .  [employer]   Social History Main Topics  . Smoking status: Never Smoker  . Smokeless tobacco: Never Used  . Alcohol use 0.5 oz/week    1 Standard drinks or equivalent per week     Comment: beer  . Drug use: No  . Sexual activity: Yes    Partners: Female   Other Topics Concern  . 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 2 dtrs , 1 son - homicide. 5 grandchildren. 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        Medication List       Accurate as of 04/14/16  9:02 AM. Always use your most recent med list.          aspirin 325 MG tablet Take 325 mg by mouth daily.   atorvastatin 20 MG tablet Commonly known as:  LIPITOR Take 1 tablet (20 mg total) by mouth daily.   bisoprolol-hydrochlorothiazide 5-6.25 MG tablet Commonly known as:   ZIAC Take 1 tablet by mouth daily.   glucose blood test strip Check blood sugar no more than twice daily   meclizine 25 MG tablet Commonly known as:  ANTIVERT TAKE 1 TABLET (25 MG TOTAL) BY MOUTH AS NEEDED FOR DIZZINESS.   metFORMIN 850 MG tablet Commonly known as:  GLUCOPHAGE Take 1 tablet (850 mg total) by mouth 2 (two) times daily with a meal.   multivitamin-iron-minerals-folic acid chewable tablet Chew 1 tablet by mouth daily.   onetouch ultrasoft lancets Check blood sugar no more than twice daily.   sildenafil 100 MG tablet Commonly known as:  VIAGRA Take 100 mg by mouth as needed for erectile dysfunction. Reported on 12/14/2015          Objective:   Physical Exam BP 124/78 (BP Location: Left Arm, Patient Position: Sitting, Cuff Size: Normal)   Pulse 67   Temp 98.4 F (36.9 C) (Oral)   Resp 14   Ht 5\' 11"  (1.803 m)   Wt 194 lb 4 oz (88.1 kg)   SpO2 98%   BMI 27.09 kg/m  General:   Well developed, well nourished . NAD.  HEENT:  Normocephalic . Face symmetric, atraumatic Lungs:  CTA B Normal respiratory effort, no intercostal retractions, no accessory muscle use. Heart: RRR,  no murmur.  No pretibial edema bilaterally  DIABETIC FEET EXAM: No lower extremity edema Normal pedal pulses bilaterally Skin normal, nails normal, no calluses Pinprick examination of the feet normal. Neurologic:  alert & oriented X3.  Speech normal, gait appropriate for age and unassisted Psych--  Cognition and judgment appear intact.  Cooperative with normal attention span and concentration.  Behavior appropriate. No anxious or depressed appearing.      Assessment & Plan:    Assessment DM  ---->  DX 07-2015, A1c 8.0; started metformin HTN Hyperlipidemia BPH, increased PSA, BX (-) -2015  PLAN DM: Good compliance with metformin, ambulatory blood sugars in the 90s, occasionally burning on the feet but for the exam is negative. Will check A1c, AST, ALT. Encourage an eye  exam. Feet care discussed HTN: Continue Ziac, last BMP satisfactory, ambulatory BPs in the 120s. Will get a flu shot at work RTC 07-2016, CPX

## 2016-04-14 NOTE — Progress Notes (Signed)
Pre visit review using our clinic review tool, if applicable. No additional management support is needed unless otherwise documented below in the visit note. 

## 2016-04-15 NOTE — Assessment & Plan Note (Signed)
DM: Good compliance with metformin, ambulatory blood sugars in the 90s, occasionally burning on the feet but for the exam is negative. Will check A1c, AST, ALT. Encourage an eye exam. Feet care discussed HTN: Continue Ziac, last BMP satisfactory, ambulatory BPs in the 120s. Will get a flu shot at work RTC 07-2016, CPX

## 2016-05-22 LAB — PSA: PSA: 5.6

## 2016-07-11 ENCOUNTER — Encounter: Payer: Self-pay | Admitting: Internal Medicine

## 2016-08-10 ENCOUNTER — Other Ambulatory Visit: Payer: Self-pay | Admitting: Internal Medicine

## 2016-08-20 ENCOUNTER — Telehealth: Payer: Self-pay | Admitting: *Deleted

## 2016-08-20 NOTE — Telephone Encounter (Signed)
Pt declines at this time

## 2016-08-21 ENCOUNTER — Encounter: Payer: Self-pay | Admitting: Internal Medicine

## 2016-08-21 ENCOUNTER — Ambulatory Visit (HOSPITAL_BASED_OUTPATIENT_CLINIC_OR_DEPARTMENT_OTHER)
Admission: RE | Admit: 2016-08-21 | Discharge: 2016-08-21 | Disposition: A | Discharge status: Home / Self Care | Payer: BLUE CROSS/BLUE SHIELD | Source: Ambulatory Visit | Attending: Internal Medicine | Admitting: Internal Medicine

## 2016-08-21 ENCOUNTER — Ambulatory Visit (INDEPENDENT_AMBULATORY_CARE_PROVIDER_SITE_OTHER): Payer: BLUE CROSS/BLUE SHIELD | Admitting: Internal Medicine

## 2016-08-21 VITALS — BP 124/74 | HR 72 | Temp 98.0°F | Resp 14 | Ht 71.0 in | Wt 194.1 lb

## 2016-08-21 DIAGNOSIS — M25552 Pain in left hip: Secondary | ICD-10-CM | POA: Diagnosis not present

## 2016-08-21 DIAGNOSIS — Z0000 Encounter for general adult medical examination without abnormal findings: Secondary | ICD-10-CM

## 2016-08-21 DIAGNOSIS — E119 Type 2 diabetes mellitus without complications: Secondary | ICD-10-CM

## 2016-08-21 DIAGNOSIS — Z1159 Encounter for screening for other viral diseases: Secondary | ICD-10-CM

## 2016-08-21 LAB — CBC WITH DIFFERENTIAL/PLATELET
Basophils Absolute: 0 10*3/uL (ref 0.0–0.1)
Basophils Relative: 0.4 % (ref 0.0–3.0)
Eosinophils Absolute: 0.2 10*3/uL (ref 0.0–0.7)
Eosinophils Relative: 2.6 % (ref 0.0–5.0)
HCT: 44.9 % (ref 39.0–52.0)
Hemoglobin: 14.5 g/dL (ref 13.0–17.0)
Lymphocytes Relative: 28.7 % (ref 12.0–46.0)
Lymphs Abs: 1.9 10*3/uL (ref 0.7–4.0)
MCHC: 32.4 g/dL (ref 30.0–36.0)
MCV: 85.9 fl (ref 78.0–100.0)
Monocytes Absolute: 0.6 10*3/uL (ref 0.1–1.0)
Monocytes Relative: 9.2 % (ref 3.0–12.0)
Neutro Abs: 3.8 10*3/uL (ref 1.4–7.7)
Neutrophils Relative %: 59.1 % (ref 43.0–77.0)
Platelets: 293 10*3/uL (ref 150.0–400.0)
RBC: 5.23 Mil/uL (ref 4.22–5.81)
RDW: 14 % (ref 11.5–15.5)
WBC: 6.5 10*3/uL (ref 4.0–10.5)

## 2016-08-21 LAB — HEMOGLOBIN A1C: Hgb A1c MFr Bld: 6.7 % — ABNORMAL HIGH (ref 4.6–6.5)

## 2016-08-21 LAB — LIPID PANEL
Cholesterol: 150 mg/dL (ref 0–200)
HDL: 39.7 mg/dL (ref >39.00)
LDL Cholesterol: 95 mg/dL (ref 0–99)
NonHDL: 110.31
Total CHOL/HDL Ratio: 4
Triglycerides: 77 mg/dL (ref 0.0–149.0)
VLDL: 15.4 mg/dL (ref 0.0–40.0)

## 2016-08-21 LAB — MICROALBUMIN / CREATININE URINE RATIO
Creatinine,U: 64.6 mg/dL
Microalb Creat Ratio: 1.1 mg/g (ref 0.0–30.0)
Microalb, Ur: <0.7 mg/dL (ref 0.0–1.9)

## 2016-08-21 LAB — BASIC METABOLIC PANEL
BUN: 17 mg/dL (ref 6–23)
CO2: 28 mEq/L (ref 19–32)
Calcium: 9.4 mg/dL (ref 8.4–10.5)
Chloride: 105 mEq/L (ref 96–112)
Creatinine, Ser: 0.93 mg/dL (ref 0.40–1.50)
GFR: 102.71 mL/min (ref >60.00)
Glucose, Bld: 92 mg/dL (ref 70–99)
Potassium: 3.8 mEq/L (ref 3.5–5.1)
Sodium: 139 mEq/L (ref 135–145)

## 2016-08-21 LAB — HEPATITIS C ANTIBODY: HCV Ab: NEGATIVE

## 2016-08-21 NOTE — Assessment & Plan Note (Addendum)
Td 2009;Pneumonia shot 2011; Prevnar 2014; zostavax 05-2015    Colonoscopy 06-2014, had polyps, next per GI (3 years) Prostate cancer screening per urology.    Diet and exercise discussed  Labs: BMP, FLP, CBC, A1c, micro, hep C  decrease aspirin from 325  to 81 mg

## 2016-08-21 NOTE — Progress Notes (Signed)
Subjective:    Patient ID: Marcus Beasley, male    DOB: December 31, 1945, 71 y.o.   MRN: [id number]  DOS:  08/21/2016 Type of visit - description : CPX Interval history: In addition to CPX we also discussed other issues  Wt Readings from Last 3 Encounters:  08/21/16 194 lb 2 oz (88.1 kg)  04/14/16 194 lb 4 oz (88.1 kg)  12/14/15 195 lb 4 oz (88.6 kg)     Review of Systems Occasional watery eye, to see the ophthalmologist soon. Occasionally urinary urgency without other symptoms, history of BPH, sees urology Has on and off pain at the left buttock without radiation, denies any fever, chills, injury. Pain is not necessarily worse with walking. Occasional dizziness, mostly when he stands, at baseline. Denies any stroke type of symptoms.  Other than above, a 14 point review of systems is negative    Past Medical History:  Diagnosis Date  . BPH (benign prostatic hyperplasia)    (-) Bx 2015  . Elevated PSA    Prostate Bx in 06/2013 was benign  . HTN (hypertension)   . Hyperlipidemia     Past Surgical History:  Procedure Laterality Date  . ABCESS DRAINAGE     abdomen- 26 day hospitalization 1968  . HERNIA REPAIR  summer '11   umbilical, dr Ninfa Linden   . PROSTATE BIOPSY  06-2013   (-)    Social History   Social History  . Marital status: Married    Spouse name: N/A  . Number of children: 3  . Years of education: 14   Occupational History  . [place], still works    .  [employer]   Social History Main Topics  . Smoking status: Never Smoker  . Smokeless tobacco: Never Used  . Alcohol use 0.5 oz/week    1 Standard drinks or equivalent per week     Comment: beer  . Drug use: No  . Sexual activity: Yes    Partners: Female   Other Topics Concern  . 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 2 dtrs , 1 son - homicide. 5 grandchildren. 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     Family History  Problem Relation Age of  Onset  . Leukemia Mother   . Heart attack Father     MI age 61  . Heart disease Sister     age 24  . Cancer - Other Sister     type  . Heart disease Brother     age 29  . Kidney disease Brother     HD  . Diabetes Maternal Grandmother   . Prostate cancer Neg Hx   . Colon cancer Neg Hx   . Esophageal cancer Neg Hx   . Rectal cancer Neg Hx   . Stomach cancer Neg Hx      Allergies as of 08/21/2016   No Known Allergies     Medication List       Accurate as of 08/21/16 10:08 AM. Always use your most recent med list.          aspirin 325 MG tablet Take 325 mg by mouth daily.   atorvastatin 20 MG tablet Commonly known as:  LIPITOR Take 1 tablet (20 mg total) by mouth daily.   bisoprolol-hydrochlorothiazide 5-6.25 MG tablet Commonly known as:  ZIAC Take 1 tablet by mouth daily.   glucose blood test strip Check blood sugar no more than  twice daily   meclizine 25 MG tablet Commonly known as:  ANTIVERT TAKE 1 TABLET (25 MG TOTAL) BY MOUTH AS NEEDED FOR DIZZINESS.   metFORMIN 850 MG tablet Commonly known as:  GLUCOPHAGE Take 1 tablet (850 mg total) by mouth 2 (two) times daily with a meal.   multivitamin-iron-minerals-folic acid chewable tablet Chew 1 tablet by mouth daily.   onetouch ultrasoft lancets Check blood sugar no more than twice daily.   sildenafil 100 MG tablet Commonly known as:  VIAGRA Take 100 mg by mouth as needed for erectile dysfunction. Reported on 12/14/2015          Objective:   Physical Exam BP 124/74 (BP Location: Left Arm, Patient Position: Sitting, Cuff Size: Normal)   Pulse 72   Temp 98 F (36.7 C) (Oral)   Resp 14   Ht 5\' 11"  (1.803 m)   Wt 194 lb 2 oz (88.1 kg)   SpO2 98%   BMI 27.07 kg/m   General:   Well developed, well nourished . NAD.  Neck: No  thyromegaly  HEENT:  Normocephalic . Face symmetric, atraumatic Lungs:  CTA B Normal respiratory effort, no intercostal retractions, no accessory muscle use. Heart: RRR,  no  murmur.  No pretibial edema bilaterally  Abdomen:  Not distended, soft, non-tender. No rebound or rigidity. Has a umbilical hernia, about 2 cm, reducible, nontender MSK: Range of motion of the hips without problems, no TTP at the trochanteric bursitis  Skin: Exposed areas without rash. Not pale. Not jaundice Neurologic:  alert & oriented X3.  Speech normal, gait appropriate for age and unassisted Strength symmetric and appropriate for age.  Psych: Cognition and judgment appear intact.  Cooperative with normal attention span and concentration.  Behavior appropriate. No anxious or depressed appearing.    Assessment & Plan:   Assessment DM  ---->  DX 07-2015, A1c 8.0; started metformin HTN Hyperlipidemia BPH, increased PSA, BX (-) -2015  PLAN DM: Continue metformin, check A1c and micro. Will see ophthalmology 09/08/2016. HTN: Continue Ziac, checking labs. High cholesterol: On Lipitor, last LFTs satisfactory, checking labs. Umbilical hernia: Reducible, warning symptoms discussed. BPH: Oligosymptomatic, follow-up by urology Hip pain: Has left buttock pain, likely hip related, will get a x-ray, Tylenol for now, his symptoms progress will call for a referral RTC 6 months

## 2016-08-21 NOTE — Assessment & Plan Note (Signed)
DM: Continue metformin, check A1c and micro. Will see ophthalmology 09/08/2016. HTN: Continue Ziac, checking labs. High cholesterol: On Lipitor, last LFTs satisfactory, checking labs. Umbilical hernia: Reducible, warning symptoms discussed. BPH: Oligosymptomatic, follow-up by urology Hip pain: Has left buttock pain, likely hip related, will get a x-ray, Tylenol for now, his symptoms progress will call for a referral RTC 6 months

## 2016-08-21 NOTE — Progress Notes (Signed)
Pre visit review using our clinic review tool, if applicable. No additional management support is needed unless otherwise documented below in the visit note. 

## 2016-08-21 NOTE — Patient Instructions (Signed)
GO TO THE LAB : Get the blood work     GO TO THE FRONT DESK Schedule your next appointment for a  routine checkup in 6 months  Schedule Medicare wellness at your convenience   STOP BY THE FIRST FLOOR:  get the XR   For pain, take Tylenol 500 mg one tablet 3 times a day as needed

## 2016-09-19 ENCOUNTER — Other Ambulatory Visit: Payer: Self-pay | Admitting: Internal Medicine

## 2016-10-09 ENCOUNTER — Encounter (INDEPENDENT_AMBULATORY_CARE_PROVIDER_SITE_OTHER): Payer: BLUE CROSS/BLUE SHIELD | Admitting: Ophthalmology

## 2016-10-09 DIAGNOSIS — H43813 Vitreous degeneration, bilateral: Secondary | ICD-10-CM | POA: Diagnosis not present

## 2016-10-09 DIAGNOSIS — H2513 Age-related nuclear cataract, bilateral: Secondary | ICD-10-CM | POA: Diagnosis not present

## 2016-10-09 DIAGNOSIS — H353132 Nonexudative age-related macular degeneration, bilateral, intermediate dry stage: Secondary | ICD-10-CM | POA: Diagnosis not present

## 2016-10-09 DIAGNOSIS — H35033 Hypertensive retinopathy, bilateral: Secondary | ICD-10-CM | POA: Diagnosis not present

## 2016-10-09 DIAGNOSIS — I10 Essential (primary) hypertension: Secondary | ICD-10-CM | POA: Diagnosis not present

## 2016-10-11 ENCOUNTER — Other Ambulatory Visit: Payer: Self-pay | Admitting: Internal Medicine

## 2016-11-08 ENCOUNTER — Other Ambulatory Visit: Payer: Self-pay | Admitting: Internal Medicine

## 2016-11-27 LAB — PSA: PSA: 5.5

## 2017-01-29 ENCOUNTER — Encounter: Payer: Self-pay | Admitting: Internal Medicine

## 2017-01-29 DIAGNOSIS — C670 Malignant neoplasm of trigone of bladder: Secondary | ICD-10-CM | POA: Insufficient documentation

## 2017-01-29 DIAGNOSIS — C61 Malignant neoplasm of prostate: Secondary | ICD-10-CM

## 2017-02-25 ENCOUNTER — Encounter: Payer: Self-pay | Admitting: Internal Medicine

## 2017-02-25 ENCOUNTER — Ambulatory Visit (INDEPENDENT_AMBULATORY_CARE_PROVIDER_SITE_OTHER): Payer: BLUE CROSS/BLUE SHIELD | Admitting: Internal Medicine

## 2017-02-25 VITALS — BP 128/78 | HR 67 | Temp 98.0°F | Resp 14 | Ht 71.0 in | Wt 193.5 lb

## 2017-02-25 DIAGNOSIS — I10 Essential (primary) hypertension: Secondary | ICD-10-CM | POA: Diagnosis not present

## 2017-02-25 DIAGNOSIS — E119 Type 2 diabetes mellitus without complications: Secondary | ICD-10-CM | POA: Diagnosis not present

## 2017-02-25 NOTE — Progress Notes (Signed)
Pre visit review using our clinic review tool, if applicable. No additional management support is needed unless otherwise documented below in the visit note. 

## 2017-02-25 NOTE — Progress Notes (Signed)
Subjective:    Patient ID: Marcus Beasley, male    DOB: 1946/02/25, 71 y.o.   MRN: [id number]  DOS:  02/25/2017 Type of visit - description : rov Interval history: DM: No concerns, good compliance of medication, check his blood sugars usually after eating, never more than 140. HTN: Good compliance of medication, not ambulatory BPs. BP today is very good    Review of Systems He remains active at work. Plan is to retire in few months. Denies lower extremity edema or chest pain. Umbilical hernia: Unchanged, denies abdominal pain or nausea  Past Medical History:  Diagnosis Date  . BPH (benign prostatic hyperplasia)    (-) Bx 2015  . Elevated PSA    Prostate Bx in 06/2013 was benign  . HTN (hypertension)   . Hyperlipidemia     Past Surgical History:  Procedure Laterality Date  . ABCESS DRAINAGE     abdomen- 26 day hospitalization 1968  . HERNIA REPAIR  summer '11   umbilical, dr Ninfa Linden   . PROSTATE BIOPSY  06-2013   (-)    Social History   Social History  . Marital status: Married    Spouse name: N/A  . Number of children: 3  . Years of education: 14   Occupational History  . [place] [employer], still works    .  [employer]   Social History Main Topics  . Smoking status: Never Smoker  . Smokeless tobacco: Never Used  . Alcohol use 0.5 oz/week    1 Standard drinks or equivalent per week     Comment: beer  . Drug use: No  . Sexual activity: Yes    Partners: Female   Other Topics Concern  . 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 2 dtrs , 1 son - homicide. 5 grandchildren. 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      Allergies as of 02/25/2017   No Known Allergies     Medication List       Accurate as of 02/25/17  7:16 PM. Always use your most recent med list.          aspirin EC 81 MG tablet Take 81 mg by mouth daily.   atorvastatin 20 MG tablet Commonly known as:  LIPITOR Take 1 tablet (20 mg total) by  mouth daily.   bisoprolol-hydrochlorothiazide 5-6.25 MG tablet Commonly known as:  ZIAC Take 1 tablet by mouth daily.   glucose blood test strip Check blood sugar no more than twice daily   meclizine 25 MG tablet Commonly known as:  ANTIVERT TAKE 1 TABLET (25 MG TOTAL) BY MOUTH AS NEEDED FOR DIZZINESS.   metFORMIN 850 MG tablet Commonly known as:  GLUCOPHAGE Take 1 tablet (850 mg total) by mouth 2 (two) times daily with a meal.   multivitamin-iron-minerals-folic acid chewable tablet Chew 1 tablet by mouth daily.   onetouch ultrasoft lancets Check blood sugar no more than twice daily.   sildenafil 100 MG tablet Commonly known as:  VIAGRA Take 100 mg by mouth as needed for erectile dysfunction. Reported on 12/14/2015            Discharge Care Instructions        Start     Ordered   02/25/17 2202  Basic metabolic panel     54/27/06 1425   02/25/17 0000  Hemoglobin A1c     02/25/17 1425         Objective:  Physical Exam BP 128/78 (BP Location: Left Arm, Patient Position: Sitting, Cuff Size: Normal)   Pulse 67   Temp 98 F (36.7 C) (Oral)   Resp 14   Ht 5\' 11"  (1.803 m)   Wt 193 lb 8 oz (87.8 kg)   SpO2 97%   BMI 26.99 kg/m  General:   Well developed, well nourished . NAD.  HEENT:  Normocephalic . Face symmetric, atraumatic Lungs:  CTA B Normal respiratory effort, no intercostal retractions, no accessory muscle use. Heart: RRR,  no murmur.  No pretibial edema bilaterally  Skin: Not pale. Not jaundice Neurologic:  alert & oriented X3.  Speech normal, gait appropriate for age and unassisted Psych--  Cognition and judgment appear intact.  Cooperative with normal attention span and concentration.  Behavior appropriate. No anxious or depressed appearing.      Assessment & Plan:   Assessment DM  ---->  DX 07-2015, A1c 8.0; started metformin HTN Hyperlipidemia BPH, increased PSA, BX (-) -2015  PLAN  DM: On metformin, CBGs never more than 140,  check a A1c. Concept of A1c discussed HTN: No ambulatory BPs but BP today is very good. Continue with Ziac. Check a BMP. Recommend a flu shot this fall RTC 6 months, CPX

## 2017-02-25 NOTE — Patient Instructions (Signed)
GO TO THE LAB : Get the blood work     GO TO THE FRONT DESK Schedule your next appointment for a physical exam by March 2019

## 2017-02-25 NOTE — Assessment & Plan Note (Signed)
DM: On metformin, CBGs never more than 140, check a A1c. Concept of A1c discussed HTN: No ambulatory BPs but BP today is very good. Continue with Ziac. Check a BMP. Recommend a flu shot this fall RTC 6 months, CPX

## 2017-02-26 LAB — BASIC METABOLIC PANEL
BUN: 13 mg/dL (ref 6–23)
CO2: 25 mEq/L (ref 19–32)
Calcium: 9.5 mg/dL (ref 8.4–10.5)
Chloride: 105 mEq/L (ref 96–112)
Creatinine, Ser: 0.89 mg/dL (ref 0.40–1.50)
GFR: 107.9 mL/min (ref >60.00)
Glucose, Bld: 93 mg/dL (ref 70–99)
Potassium: 3.8 mEq/L (ref 3.5–5.1)
Sodium: 139 mEq/L (ref 135–145)

## 2017-02-26 LAB — HEMOGLOBIN A1C: Hgb A1c MFr Bld: 6.6 % — ABNORMAL HIGH (ref 4.6–6.5)

## 2017-03-23 ENCOUNTER — Other Ambulatory Visit: Payer: Self-pay | Admitting: Internal Medicine

## 2017-05-15 ENCOUNTER — Other Ambulatory Visit: Payer: Self-pay | Admitting: Internal Medicine

## 2017-06-11 ENCOUNTER — Other Ambulatory Visit: Payer: Self-pay | Admitting: Internal Medicine

## 2017-06-17 ENCOUNTER — Other Ambulatory Visit: Payer: Self-pay | Admitting: Internal Medicine

## 2017-06-26 ENCOUNTER — Other Ambulatory Visit: Payer: Self-pay | Admitting: Internal Medicine

## 2017-07-06 ENCOUNTER — Other Ambulatory Visit: Payer: Self-pay | Admitting: Urology

## 2017-07-06 DIAGNOSIS — R972 Elevated prostate specific antigen [PSA]: Secondary | ICD-10-CM

## 2017-07-23 ENCOUNTER — Ambulatory Visit
Admission: RE | Admit: 2017-07-23 | Discharge: 2017-07-23 | Disposition: A | Discharge status: Home / Self Care | Payer: Medicare Other | Source: Ambulatory Visit | Attending: Urology | Admitting: Urology

## 2017-07-23 DIAGNOSIS — R972 Elevated prostate specific antigen [PSA]: Secondary | ICD-10-CM

## 2017-07-23 MED ORDER — GADOBENATE DIMEGLUMINE 529 MG/ML IV SOLN
18.0000 mL | Freq: Once | INTRAVENOUS | Status: AC | PRN
  Administered 2017-07-23: 18 mL via INTRAVENOUS

## 2017-07-28 ENCOUNTER — Encounter: Payer: Self-pay | Admitting: Internal Medicine

## 2017-08-11 DIAGNOSIS — N411 Chronic prostatitis: Secondary | ICD-10-CM | POA: Diagnosis not present

## 2017-08-11 DIAGNOSIS — R972 Elevated prostate specific antigen [PSA]: Secondary | ICD-10-CM | POA: Diagnosis not present

## 2017-08-25 ENCOUNTER — Encounter: Payer: Self-pay | Admitting: Internal Medicine

## 2017-08-25 ENCOUNTER — Ambulatory Visit (INDEPENDENT_AMBULATORY_CARE_PROVIDER_SITE_OTHER): Payer: Medicare Other | Admitting: Internal Medicine

## 2017-08-25 VITALS — BP 136/74 | HR 71 | Temp 98.0°F | Resp 14 | Ht 71.0 in | Wt 191.1 lb

## 2017-08-25 DIAGNOSIS — E119 Type 2 diabetes mellitus without complications: Secondary | ICD-10-CM

## 2017-08-25 DIAGNOSIS — Z23 Encounter for immunization: Secondary | ICD-10-CM

## 2017-08-25 DIAGNOSIS — Z0100 Encounter for examination of eyes and vision without abnormal findings: Secondary | ICD-10-CM

## 2017-08-25 DIAGNOSIS — Z0000 Encounter for general adult medical examination without abnormal findings: Secondary | ICD-10-CM

## 2017-08-25 DIAGNOSIS — E785 Hyperlipidemia, unspecified: Secondary | ICD-10-CM | POA: Diagnosis not present

## 2017-08-25 LAB — MICROALBUMIN / CREATININE URINE RATIO
Creatinine,U: 32.4 mg/dL
Microalb Creat Ratio: 2.2 mg/g (ref 0.0–30.0)
Microalb, Ur: <0.7 mg/dL (ref 0.0–1.9)

## 2017-08-25 LAB — CBC WITH DIFFERENTIAL/PLATELET
Basophils Absolute: 0 10*3/uL (ref 0.0–0.1)
Basophils Relative: 0.3 % (ref 0.0–3.0)
Eosinophils Absolute: 0.1 10*3/uL (ref 0.0–0.7)
Eosinophils Relative: 2.2 % (ref 0.0–5.0)
HCT: 42.5 % (ref 39.0–52.0)
Hemoglobin: 14.1 g/dL (ref 13.0–17.0)
Lymphocytes Relative: 32.5 % (ref 12.0–46.0)
Lymphs Abs: 1.8 10*3/uL (ref 0.7–4.0)
MCHC: 33.1 g/dL (ref 30.0–36.0)
MCV: 85.6 fl (ref 78.0–100.0)
Monocytes Absolute: 0.5 10*3/uL (ref 0.1–1.0)
Monocytes Relative: 9.1 % (ref 3.0–12.0)
Neutro Abs: 3.1 10*3/uL (ref 1.4–7.7)
Neutrophils Relative %: 55.9 % (ref 43.0–77.0)
Platelets: 274 10*3/uL (ref 150.0–400.0)
RBC: 4.97 Mil/uL (ref 4.22–5.81)
RDW: 14.6 % (ref 11.5–15.5)
WBC: 5.5 10*3/uL (ref 4.0–10.5)

## 2017-08-25 LAB — TSH: TSH: 1.08 u[IU]/mL (ref 0.35–4.50)

## 2017-08-25 LAB — COMPREHENSIVE METABOLIC PANEL
ALT: 15 U/L (ref 0–53)
AST: 15 U/L (ref 0–37)
Albumin: 4.2 g/dL (ref 3.5–5.2)
Alkaline Phosphatase: 73 U/L (ref 39–117)
BUN: 19 mg/dL (ref 6–23)
CO2: 28 mEq/L (ref 19–32)
Calcium: 9.7 mg/dL (ref 8.4–10.5)
Chloride: 105 mEq/L (ref 96–112)
Creatinine, Ser: 0.87 mg/dL (ref 0.40–1.50)
GFR: 110.62 mL/min (ref >60.00)
Glucose, Bld: 94 mg/dL (ref 70–99)
Potassium: 3.8 mEq/L (ref 3.5–5.1)
Sodium: 140 mEq/L (ref 135–145)
Total Bilirubin: 0.6 mg/dL (ref 0.2–1.2)
Total Protein: 7.2 g/dL (ref 6.0–8.3)

## 2017-08-25 LAB — HEMOGLOBIN A1C: Hgb A1c MFr Bld: 6.5 % (ref 4.6–6.5)

## 2017-08-25 LAB — LIPID PANEL
Cholesterol: 156 mg/dL (ref 0–200)
HDL: 41.3 mg/dL (ref >39.00)
LDL Cholesterol: 99 mg/dL (ref 0–99)
NonHDL: 114.61
Total CHOL/HDL Ratio: 4
Triglycerides: 76 mg/dL (ref 0.0–149.0)
VLDL: 15.2 mg/dL (ref 0.0–40.0)

## 2017-08-25 NOTE — Assessment & Plan Note (Addendum)
-  Td 08-2017,  ;Pneumonia shot 2011; Prevnar 2014; zostavax 05-2015 ; had shingrex #1 at work ? - [facility]: Colonoscopy 06-2014, had polyps, next is due, GI letter printed - Prostate cancer screening per urology.  Patient reports recent negative prostate biopsy -Diet and exercise discussed -Labs: CMP, FLP, CBC, A1c, TSH, microalbumin

## 2017-08-25 NOTE — Patient Instructions (Signed)
GO TO THE LAB : Get the blood work     GO TO THE FRONT DESK Schedule your next appointment for a routine checkup in 6 months    Diabetes and Foot Care Diabetes may cause you to have problems because of poor blood supply (circulation) to your feet and legs. This may cause the skin on your feet to become thinner, break easier, and heal more slowly. Your skin may become dry, and the skin may peel and crack. You may also have nerve damage in your legs and feet causing decreased feeling in them. You may not notice minor injuries to your feet that could lead to infections or more serious problems. Taking care of your feet is one of the most important things you can do for yourself. Follow these instructions at home:  Wear shoes at all times, even in the house. Do not go barefoot. Bare feet are easily injured.  Check your feet daily for blisters, cuts, and redness. If you cannot see the bottom of your feet, use a mirror or ask someone for help.  Wash your feet with warm water (do not use hot water) and mild soap. Then pat your feet and the areas between your toes until they are completely dry. Do not soak your feet as this can dry your skin.  Apply a moisturizing lotion or petroleum jelly (that does not contain alcohol and is unscented) to the skin on your feet and to dry, brittle toenails. Do not apply lotion between your toes.  Trim your toenails straight across. Do not dig under them or around the cuticle. File the edges of your nails with an emery board or nail file.  Do not cut corns or calluses or try to remove them with medicine.  Wear clean socks or stockings every day. Make sure they are not too tight. Do not wear knee-high stockings since they may decrease blood flow to your legs.  Wear shoes that fit properly and have enough cushioning. To break in new shoes, wear them for just a few hours a day. This prevents you from injuring your feet. Always look in your shoes before you put them on  to be sure there are no objects inside.  Do not cross your legs. This may decrease the blood flow to your feet.  If you find a minor scrape, cut, or break in the skin on your feet, keep it and the skin around it clean and dry. These areas may be cleansed with mild soap and water. Do not cleanse the area with peroxide, alcohol, or iodine.  When you remove an adhesive bandage, be sure not to damage the skin around it.  If you have a wound, look at it several times a day to make sure it is healing.  Do not use heating pads or hot water bottles. They may burn your skin. If you have lost feeling in your feet or legs, you may not know it is happening until it is too late.  Make sure your health care provider performs a complete foot exam at least annually or more often if you have foot problems. Report any cuts, sores, or bruises to your health care provider immediately. Contact a health care provider if:  You have an injury that is not healing.  You have cuts or breaks in the skin.  You have an ingrown nail.  You notice redness on your legs or feet.  You feel burning or tingling in your legs or feet.  You have pain or cramps in your legs and feet.  Your legs or feet are numb.  Your feet always feel cold. Get help right away if:  There is increasing redness, swelling, or pain in or around a wound.  There is a red line that goes up your leg.  Pus is coming from a wound.  You develop a fever or as directed by your health care provider.  You notice a bad smell coming from an ulcer or wound. This information is not intended to replace advice given to you by your health care provider. Make sure you discuss any questions you have with your health care provider. Document Released: 06/06/2000 Document Revised: 11/15/2015 Document Reviewed: 11/16/2012 Elsevier Interactive Patient Education  2017 [employer].

## 2017-08-25 NOTE — Progress Notes (Signed)
Pre visit review using our clinic review tool, if applicable. No additional management support is needed unless otherwise documented below in the visit note. 

## 2017-08-25 NOTE — Progress Notes (Signed)
Subjective:    Patient ID: Marcus Beasley, male    DOB: Nov 17, 1945, 72 y.o.   MRN: [id number]  DOS:  08/25/2017 Type of visit - description :  cpx Interval history: No major concerns, good compliance with medication.   Review of Systems Denies lower extremity paresthesias. Occasionally does have a discomfort at the left pretibial area but no calf pain or claudication per se.  No swelling.   Other than above, a 14 point review of systems is negative    Past Medical History:  Diagnosis Date  . BPH (benign prostatic hyperplasia)    (-) Bx 2015  . Elevated PSA    Prostate Bx in 06/2013 was benign  . HTN (hypertension)   . Hyperlipidemia     Past Surgical History:  Procedure Laterality Date  . ABCESS DRAINAGE     abdomen- 26 day hospitalization 1968  . HERNIA REPAIR  summer '11   umbilical, dr Ninfa Linden   . PROSTATE BIOPSY  06-2013 , 07-2017   (-), (-)    Social History   Socioeconomic History  . Marital status: Married    Spouse name: Not on file  . Number of children: 3  . Years of education: 77  . Highest education level: Not on file  Social Needs  . Financial resource strain: Not on file  . Food insecurity - worry: Not on file  . Food insecurity - inability: Not on file  . Transportation needs - medical: Not on file  . Transportation needs - non-medical: Not on file  Occupational History  . 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 Smoking status: Never Smoker  . Smokeless tobacco: Never Used  Substance and Sexual Activity  . Alcohol use: Yes    Alcohol/week: 0.5 oz    Types: 1 Standard drinks or equivalent per week    Comment: beer  . Drug use: No  . Sexual activity: Yes    Partners: Female  Other Topics Concern  . 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 2 dtrs , 1 son - homicide. 5 grandchildren.       Family History  Problem Relation Age of Onset  .  Leukemia Mother   . Heart attack Father        MI age 71  . Heart disease Sister        age 45  . Cancer - Other Sister        type  . Heart disease Brother        age 11  . Kidney disease Brother        HD  . Diabetes Maternal Grandmother   . Prostate cancer Neg Hx   . Colon cancer Neg Hx   . Esophageal cancer Neg Hx   . Rectal cancer Neg Hx   . Stomach cancer Neg Hx      Allergies as of 08/25/2017   No Known Allergies     Medication List        Accurate as of 08/25/17  6:11 PM. Always use your most recent med list.          aspirin EC 81 MG tablet Take 81 mg by mouth daily.   atorvastatin 20 MG tablet Commonly known as:  LIPITOR Take 1 tablet (20 mg total) by mouth daily.   bisoprolol-hydrochlorothiazide 5-6.25 MG tablet Commonly known as:  ZIAC Take 1 tablet  by mouth daily.   meclizine 25 MG tablet Commonly known as:  ANTIVERT Take 1 tablet (25 mg total) by mouth as needed for dizziness.   metFORMIN 850 MG tablet Commonly known as:  GLUCOPHAGE Take 1 tablet (850 mg total) by mouth 2 (two) times daily with a meal.   multivitamin-iron-minerals-folic acid chewable tablet Chew 1 tablet by mouth daily.   onetouch ultrasoft lancets Check blood sugar no more than twice daily.   ONETOUCH VERIO test strip Generic drug:  glucose blood CHECK BLOOD SUGAR NO MORE THAN TWICE DAILY   sildenafil 100 MG tablet Commonly known as:  VIAGRA Take 100 mg by mouth as needed for erectile dysfunction. Reported on 12/14/2015          Objective:   Physical Exam BP 136/74 (BP Location: Left Arm, Patient Position: Sitting, Cuff Size: Normal)   Pulse 71   Temp 98 F (36.7 C) (Oral)   Resp 14   Ht 5\' 11"  (1.803 m)   Wt 191 lb 2 oz (86.7 kg)   SpO2 91%   BMI 26.66 kg/m  General:   Well developed, well nourished . NAD.  HEENT:  Normocephalic . Face symmetric, atraumatic Lungs:  CTA B Normal respiratory effort, no intercostal retractions, no accessory muscle  use. Heart: RRR,  no murmur.  No pretibial edema bilaterally  Vascular exam: Normal carotid, femoral, and pedal pulses Abdomen:  Not distended, soft, non-tender. No rebound or rigidity.   Skin: Exposed areas without rash. Not pale. Not jaundice Neurologic:  alert & oriented X3.  Speech normal, gait appropriate for age and unassisted Strength symmetric and appropriate for age.  DIABETIC FEET EXAM: No lower extremity edema Normal pedal pulses bilaterally Skin normal, nails normal, no calluses Pinprick examination: Decreased sensitivity distally in patchy fashion bilaterally  psych: Cognition and judgment appear intact.  Cooperative with normal attention span and concentration.  Behavior appropriate. No anxious or depressed appearing.     Assessment & Plan:    Assessment DM  ---->  DX 07-2015, A1c 8.0; started metformin Neuropathy, mild.  DX 08-2017 (in sensitive type) HTN Hyperlipidemia BPH, increased PSA, BX (-) -2015, (-) 07-2017 per pt   PLAN  DM: On metformin, feet exam show mild neuropathy, feet care discussed.  Reports an eye exam about a year ago, rec to have eye check q year.Checking A1c, microalbumin.    HTN: Seems controlled, continue Ziac hyperlipidemia: On Lipitor, checking labs RTC 6 months

## 2017-08-25 NOTE — Assessment & Plan Note (Signed)
DM: On metformin, feet exam show mild neuropathy, feet care discussed.  Reports an eye exam about a year ago, rec to have eye check q year.Checking A1c, microalbumin.    HTN: Seems controlled, continue Ziac hyperlipidemia: On Lipitor, checking labs RTC 6 months

## 2017-09-02 ENCOUNTER — Encounter: Payer: Self-pay | Admitting: Internal Medicine

## 2017-09-17 ENCOUNTER — Other Ambulatory Visit: Payer: Self-pay | Admitting: Internal Medicine

## 2017-10-07 ENCOUNTER — Other Ambulatory Visit: Payer: Self-pay | Admitting: Internal Medicine

## 2017-10-28 ENCOUNTER — Ambulatory Visit (AMBULATORY_SURGERY_CENTER): Payer: Self-pay | Admitting: *Deleted

## 2017-10-28 ENCOUNTER — Other Ambulatory Visit: Payer: Self-pay

## 2017-10-28 VITALS — Ht 71.0 in | Wt 190.0 lb

## 2017-10-28 DIAGNOSIS — Z86010 Personal history of colonic polyps: Secondary | ICD-10-CM

## 2017-10-28 MED ORDER — NA SULFATE-K SULFATE-MG SULF 17.5-3.13-1.6 GM/177ML PO SOLN: 1.0000 | Freq: Once | ORAL | 0 refills | Status: AC

## 2017-10-28 NOTE — Progress Notes (Signed)
Wife present during [place]. Patient denies any allergies to eggs or soy. Patient denies any problems with anesthesia/sedation. Patient denies any oxygen use at home. Patient denies taking any diet/weight loss medications or blood thinners. EMMI education declined by the pt.

## 2017-11-02 ENCOUNTER — Encounter: Payer: Self-pay | Admitting: Internal Medicine

## 2017-11-11 ENCOUNTER — Encounter: Payer: Self-pay | Admitting: Internal Medicine

## 2017-11-11 ENCOUNTER — Other Ambulatory Visit: Payer: Self-pay

## 2017-11-11 ENCOUNTER — Ambulatory Visit (AMBULATORY_SURGERY_CENTER): Payer: Medicare Other | Admitting: Internal Medicine

## 2017-11-11 VITALS — BP 123/79 | HR 68 | Temp 97.8°F | Resp 17 | Ht 71.0 in | Wt 191.0 lb

## 2017-11-11 DIAGNOSIS — D125 Benign neoplasm of sigmoid colon: Secondary | ICD-10-CM | POA: Diagnosis not present

## 2017-11-11 DIAGNOSIS — K635 Polyp of colon: Secondary | ICD-10-CM | POA: Diagnosis not present

## 2017-11-11 DIAGNOSIS — Z86010 Personal history of colonic polyps: Secondary | ICD-10-CM

## 2017-11-11 DIAGNOSIS — D120 Benign neoplasm of cecum: Secondary | ICD-10-CM

## 2017-11-11 DIAGNOSIS — Z1211 Encounter for screening for malignant neoplasm of colon: Secondary | ICD-10-CM | POA: Diagnosis not present

## 2017-11-11 MED ORDER — SODIUM CHLORIDE 0.9 % IV SOLN: 500.0000 mL | Freq: Once | INTRAVENOUS | Status: DC

## 2017-11-11 NOTE — Progress Notes (Signed)
A and O x3. Report to RN. Tolerated MAC anesthesia well.

## 2017-11-11 NOTE — Progress Notes (Signed)
Pt's states no medical or surgical changes since previsit or office visit. 

## 2017-11-11 NOTE — Op Note (Signed)
[facility] Patient Name: Marcus Beasley Procedure Date: 11/11/2017 8:10 AM MRN: [id number] Endoscopist: Jerene Bears , MD Age: 72 Referring MD:  Date of Birth: [birth year] Gender: Male Account #: [id number] Procedure:                Colonoscopy Indications:              Surveillance: Personal history of adenomatous                            polyps on last colonoscopy 3 years ago Medicines:                Propofol per Anesthesia Procedure:                Pre-Anesthesia Assessment:                           - Prior to the procedure, a History and Physical                            was performed, and patient medications and                            allergies were reviewed. The patient's tolerance of                            previous anesthesia was also reviewed. The risks                            and benefits of the procedure and the sedation                            options and risks were discussed with the patient.                            All questions were answered, and informed consent                            was obtained. Prior Anticoagulants: The patient has                            taken no previous anticoagulant or antiplatelet                            agents. ASA Grade Assessment: III - A patient with                            severe systemic disease. After reviewing the risks                            and benefits, the patient was deemed in                            satisfactory condition to undergo the procedure.  After obtaining informed consent, the colonoscope                            was passed under direct vision. Throughout the                            procedure, the patient's blood pressure, pulse, and                            oxygen saturations were monitored continuously. The                            Colonoscope was introduced through the anus and                            advanced to the cecum,  identified by appendiceal                            orifice and ileocecal valve. The colonoscopy was                            performed without difficulty. The patient tolerated                            the procedure well. The quality of the bowel                            preparation was good. The ileocecal valve,                            appendiceal orifice, and rectum were photographed. Scope In: 8:16:48 AM Scope Out: 8:33:27 AM Scope Withdrawal Time: 0 hours 13 minutes 16 seconds  Total Procedure Duration: 0 hours 16 minutes 39 seconds  Findings:                 The digital rectal exam was normal.                           Two sessile polyps were found in the cecum. The                            polyps were 2 to 3 mm in size. These polyps were                            removed with a cold biopsy forceps. Resection and                            retrieval were complete.                           Two sessile polyps were found in the sigmoid colon.                            The polyps were 3 to  5 mm in size. These polyps                            were removed with a cold snare. Resection and                            retrieval were complete.                           Multiple medium-mouthed diverticula were found in                            the sigmoid colon and distal descending colon.                           Internal hemorrhoids were found during                            retroflexion. The hemorrhoids were small. Complications:            No immediate complications. Estimated Blood Loss:     Estimated blood loss was minimal. Impression:               - Two 2 to 3 mm polyps in the cecum, removed with a                            cold biopsy forceps. Resected and retrieved.                           - Two 3 to 5 mm polyps in the sigmoid colon,                            removed with a cold snare. Resected and retrieved.                           - Mild diverticulosis  in the sigmoid colon and in                            the distal descending colon.                           - Small internal hemorrhoids. Recommendation:           - Patient has a contact number available for                            emergencies. The signs and symptoms of potential                            delayed complications were discussed with the                            patient. Return to normal activities tomorrow.  Written discharge instructions were provided to the                            patient.                           - Resume previous diet.                           - Continue present medications.                           - Await pathology results.                           - Repeat colonoscopy is recommended for                            surveillance. The colonoscopy date will be                            determined after pathology results from today's                            exam become available for review. Jerene Bears, MD 11/11/2017 8:40:00 AM This report has been signed electronically.

## 2017-11-11 NOTE — Patient Instructions (Signed)
**   Handouts given on polyps, diverticulosis, and hemorrhoids **   YOU HAD AN ENDOSCOPIC PROCEDURE TODAY AT THE [facility]:   Refer to the procedure report that was given to you for any specific questions about what was found during the examination.  If the procedure report does not answer your questions, please call your gastroenterologist to clarify.  If you requested that your care partner not be given the details of your procedure findings, then the procedure report has been included in a sealed envelope for you to review at your convenience later.  YOU SHOULD EXPECT: Some feelings of bloating in the abdomen. Passage of more gas than usual.  Walking can help get rid of the air that was put into your GI tract during the procedure and reduce the bloating. If you had a lower endoscopy (such as a colonoscopy or flexible sigmoidoscopy) you may notice spotting of blood in your stool or on the toilet paper. If you underwent a bowel prep for your procedure, you may not have a normal bowel movement for a few days.  Please Note:  You might notice some irritation and congestion in your nose or some drainage.  This is from the oxygen used during your procedure.  There is no need for concern and it should clear up in a day or so.  SYMPTOMS TO REPORT IMMEDIATELY:   Following lower endoscopy (colonoscopy or flexible sigmoidoscopy):  Excessive amounts of blood in the stool  Significant tenderness or worsening of abdominal pains  Swelling of the abdomen that is new, acute  Fever of 100F or higher  For urgent or emergent issues, a gastroenterologist can be reached at any hour by calling [phone].   DIET:  We do recommend a small meal at first, but then you may proceed to your regular diet.  Drink plenty of fluids but you should avoid alcoholic beverages for 24 hours.  ACTIVITY:  You should plan to take it easy for the rest of today and you should NOT DRIVE or use heavy machinery until  tomorrow (because of the sedation medicines used during the test).    FOLLOW UP: Our staff will call the number listed on your records the next business day following your procedure to check on you and address any questions or concerns that you may have regarding the information given to you following your procedure. If we do not reach you, we will leave a message.  However, if you are feeling well and you are not experiencing any problems, there is no need to return our call.  We will assume that you have returned to your regular daily activities without incident.  If any biopsies were taken you will be contacted by phone or by letter within the next 1-3 weeks.  Please call us at [phone] if you have not heard about the biopsies in 3 weeks.    SIGNATURES/CONFIDENTIALITY: You and/or your care partner have signed paperwork which will be entered into your electronic medical record.  These signatures attest to the fact that that the information above on your After Visit Summary has been reviewed and is understood.  Full responsibility of the confidentiality of this discharge information lies with you and/or your care-partner. 

## 2017-11-11 NOTE — Progress Notes (Signed)
Called to room to assist during endoscopic procedure.  Patient ID and intended procedure confirmed with present staff. Received instructions for my participation in the procedure from the performing physician.  

## 2017-11-12 ENCOUNTER — Telehealth: Payer: Self-pay | Admitting: *Deleted

## 2017-11-12 NOTE — Telephone Encounter (Signed)
  Follow up Call-  Call back number 11/11/2017  Post procedure Call Back phone  # [phone]  Permission to leave phone message Yes  Some recent data might be hidden     Patient questions:  Do you have a fever, pain , or abdominal swelling? No. Pain Score  0 *  Have you tolerated food without any problems? Yes.    Have you been able to return to your normal activities? Yes.    Do you have any questions about your discharge instructions: Diet   No. Medications  No. Follow up visit  No.  Do you have questions or concerns about your Care? No.  Actions: * If pain score is 4 or above: No action needed, pain <4.

## 2017-11-17 ENCOUNTER — Telehealth: Payer: Self-pay | Admitting: Internal Medicine

## 2017-11-18 ENCOUNTER — Encounter: Payer: Self-pay | Admitting: Internal Medicine

## 2017-11-20 NOTE — Telephone Encounter (Signed)
Patient states the pharmacy has not heard anything from Dr. Larose Kells in regards to this RX. Please advise.

## 2017-11-20 NOTE — Telephone Encounter (Signed)
metFORMIN (GLUCOPHAGE) 850 MG tablet 180 tablet 1 11/17/2017    Sig - Route: Take 1 tablet (850 mg total) by mouth 2 (two) times daily with a meal. - Oral   Sent to pharmacy as: metFORMIN (GLUCOPHAGE) 850 MG tablet   Notes to Pharmacy: PLEASE SEND RX   E-Prescribing Status: Receipt confirmed by pharmacy (11/17/2017 11:57 AM EDT)

## 2017-12-16 ENCOUNTER — Other Ambulatory Visit: Payer: Self-pay | Admitting: Internal Medicine

## 2018-01-26 LAB — PSA: PSA: 8.06

## 2018-01-28 LAB — PSA: PSA: 5.6

## 2018-02-04 ENCOUNTER — Encounter: Payer: Self-pay | Admitting: Internal Medicine

## 2018-02-11 DIAGNOSIS — H40013 Open angle with borderline findings, low risk, bilateral: Secondary | ICD-10-CM | POA: Diagnosis not present

## 2018-02-11 DIAGNOSIS — H35363 Drusen (degenerative) of macula, bilateral: Secondary | ICD-10-CM | POA: Diagnosis not present

## 2018-02-11 DIAGNOSIS — H35372 Puckering of macula, left eye: Secondary | ICD-10-CM | POA: Diagnosis not present

## 2018-02-11 DIAGNOSIS — H40033 Anatomical narrow angle, bilateral: Secondary | ICD-10-CM | POA: Diagnosis not present

## 2018-02-11 LAB — HM DIABETES EYE EXAM

## 2018-02-24 ENCOUNTER — Encounter: Payer: Self-pay | Admitting: Internal Medicine

## 2018-03-12 ENCOUNTER — Emergency Department (HOSPITAL_COMMUNITY): Payer: Medicare Other

## 2018-03-12 ENCOUNTER — Other Ambulatory Visit: Payer: Self-pay

## 2018-03-12 ENCOUNTER — Inpatient Hospital Stay (HOSPITAL_COMMUNITY)
Admission: EM | Admit: 2018-03-12 | Discharge: 2018-03-17 | DRG: 065 | Disposition: A | Discharge status: Rehabilitation Facility | Payer: Medicare Other | Attending: Neurology | Admitting: Neurology

## 2018-03-12 ENCOUNTER — Encounter (HOSPITAL_COMMUNITY): Payer: Self-pay

## 2018-03-12 DIAGNOSIS — R29710 NIHSS score 10: Secondary | ICD-10-CM | POA: Diagnosis not present

## 2018-03-12 DIAGNOSIS — I618 Other nontraumatic intracerebral hemorrhage: Principal | ICD-10-CM | POA: Diagnosis present

## 2018-03-12 DIAGNOSIS — I480 Paroxysmal atrial fibrillation: Secondary | ICD-10-CM | POA: Diagnosis not present

## 2018-03-12 DIAGNOSIS — R471 Dysarthria and anarthria: Secondary | ICD-10-CM | POA: Diagnosis present

## 2018-03-12 DIAGNOSIS — E669 Obesity, unspecified: Secondary | ICD-10-CM | POA: Diagnosis not present

## 2018-03-12 DIAGNOSIS — N400 Enlarged prostate without lower urinary tract symptoms: Secondary | ICD-10-CM | POA: Diagnosis present

## 2018-03-12 DIAGNOSIS — R0989 Other specified symptoms and signs involving the circulatory and respiratory systems: Secondary | ICD-10-CM | POA: Diagnosis not present

## 2018-03-12 DIAGNOSIS — G8194 Hemiplegia, unspecified affecting left nondominant side: Secondary | ICD-10-CM | POA: Diagnosis present

## 2018-03-12 DIAGNOSIS — Z806 Family history of leukemia: Secondary | ICD-10-CM

## 2018-03-12 DIAGNOSIS — R402142 Coma scale, eyes open, spontaneous, at arrival to emergency department: Secondary | ICD-10-CM | POA: Diagnosis present

## 2018-03-12 DIAGNOSIS — Z8249 Family history of ischemic heart disease and other diseases of the circulatory system: Secondary | ICD-10-CM | POA: Diagnosis not present

## 2018-03-12 DIAGNOSIS — I69354 Hemiplegia and hemiparesis following cerebral infarction affecting left non-dominant side: Secondary | ICD-10-CM | POA: Diagnosis not present

## 2018-03-12 DIAGNOSIS — Z809 Family history of malignant neoplasm, unspecified: Secondary | ICD-10-CM | POA: Diagnosis not present

## 2018-03-12 DIAGNOSIS — R531 Weakness: Secondary | ICD-10-CM | POA: Diagnosis not present

## 2018-03-12 DIAGNOSIS — Z841 Family history of disorders of kidney and ureter: Secondary | ICD-10-CM

## 2018-03-12 DIAGNOSIS — Z7984 Long term (current) use of oral hypoglycemic drugs: Secondary | ICD-10-CM | POA: Diagnosis not present

## 2018-03-12 DIAGNOSIS — Z833 Family history of diabetes mellitus: Secondary | ICD-10-CM

## 2018-03-12 DIAGNOSIS — Z23 Encounter for immunization: Secondary | ICD-10-CM | POA: Diagnosis not present

## 2018-03-12 DIAGNOSIS — R402252 Coma scale, best verbal response, oriented, at arrival to emergency department: Secondary | ICD-10-CM | POA: Diagnosis present

## 2018-03-12 DIAGNOSIS — I6389 Other cerebral infarction: Secondary | ICD-10-CM | POA: Diagnosis not present

## 2018-03-12 DIAGNOSIS — H3530 Unspecified macular degeneration: Secondary | ICD-10-CM | POA: Diagnosis present

## 2018-03-12 DIAGNOSIS — E1142 Type 2 diabetes mellitus with diabetic polyneuropathy: Secondary | ICD-10-CM | POA: Diagnosis not present

## 2018-03-12 DIAGNOSIS — I69154 Hemiplegia and hemiparesis following nontraumatic intracerebral hemorrhage affecting left non-dominant side: Secondary | ICD-10-CM | POA: Diagnosis not present

## 2018-03-12 DIAGNOSIS — R4781 Slurred speech: Secondary | ICD-10-CM | POA: Diagnosis not present

## 2018-03-12 DIAGNOSIS — I619 Nontraumatic intracerebral hemorrhage, unspecified: Secondary | ICD-10-CM | POA: Diagnosis present

## 2018-03-12 DIAGNOSIS — R402362 Coma scale, best motor response, obeys commands, at arrival to emergency department: Secondary | ICD-10-CM | POA: Diagnosis not present

## 2018-03-12 DIAGNOSIS — Z7982 Long term (current) use of aspirin: Secondary | ICD-10-CM

## 2018-03-12 DIAGNOSIS — E1165 Type 2 diabetes mellitus with hyperglycemia: Secondary | ICD-10-CM | POA: Diagnosis not present

## 2018-03-12 DIAGNOSIS — I69228 Other speech and language deficits following other nontraumatic intracranial hemorrhage: Secondary | ICD-10-CM | POA: Diagnosis not present

## 2018-03-12 DIAGNOSIS — I610 Nontraumatic intracerebral hemorrhage in hemisphere, subcortical: Secondary | ICD-10-CM | POA: Diagnosis not present

## 2018-03-12 DIAGNOSIS — R200 Anesthesia of skin: Secondary | ICD-10-CM | POA: Diagnosis not present

## 2018-03-12 DIAGNOSIS — I10 Essential (primary) hypertension: Secondary | ICD-10-CM | POA: Diagnosis not present

## 2018-03-12 DIAGNOSIS — I69254 Hemiplegia and hemiparesis following other nontraumatic intracranial hemorrhage affecting left non-dominant side: Secondary | ICD-10-CM | POA: Diagnosis not present

## 2018-03-12 DIAGNOSIS — I69398 Other sequelae of cerebral infarction: Secondary | ICD-10-CM | POA: Diagnosis not present

## 2018-03-12 DIAGNOSIS — E119 Type 2 diabetes mellitus without complications: Secondary | ICD-10-CM | POA: Diagnosis not present

## 2018-03-12 DIAGNOSIS — E1159 Type 2 diabetes mellitus with other circulatory complications: Secondary | ICD-10-CM | POA: Diagnosis not present

## 2018-03-12 DIAGNOSIS — E785 Hyperlipidemia, unspecified: Secondary | ICD-10-CM | POA: Diagnosis not present

## 2018-03-12 DIAGNOSIS — M792 Neuralgia and neuritis, unspecified: Secondary | ICD-10-CM | POA: Diagnosis not present

## 2018-03-12 DIAGNOSIS — I161 Hypertensive emergency: Secondary | ICD-10-CM | POA: Diagnosis not present

## 2018-03-12 DIAGNOSIS — E1169 Type 2 diabetes mellitus with other specified complication: Secondary | ICD-10-CM | POA: Diagnosis not present

## 2018-03-12 DIAGNOSIS — R209 Unspecified disturbances of skin sensation: Secondary | ICD-10-CM | POA: Diagnosis not present

## 2018-03-12 DIAGNOSIS — R29810 Facial weakness: Secondary | ICD-10-CM | POA: Diagnosis not present

## 2018-03-12 DIAGNOSIS — I4891 Unspecified atrial fibrillation: Secondary | ICD-10-CM | POA: Diagnosis not present

## 2018-03-12 DIAGNOSIS — I69292 Facial weakness following other nontraumatic intracranial hemorrhage: Secondary | ICD-10-CM | POA: Diagnosis not present

## 2018-03-12 LAB — RAPID URINE DRUG SCREEN, HOSP PERFORMED
Amphetamines: NOT DETECTED
Barbiturates: NOT DETECTED
Benzodiazepines: NOT DETECTED
Cocaine: NOT DETECTED
Opiates: NOT DETECTED
Tetrahydrocannabinol: NOT DETECTED

## 2018-03-12 LAB — COMPREHENSIVE METABOLIC PANEL
ALT: 18 U/L (ref 0–44)
AST: 25 U/L (ref 15–41)
Albumin: 3.8 g/dL (ref 3.5–5.0)
Alkaline Phosphatase: 67 U/L (ref 38–126)
Anion gap: 13 (ref 5–15)
BUN: 13 mg/dL (ref 8–23)
CO2: 22 mmol/L (ref 22–32)
Calcium: 9.2 mg/dL (ref 8.9–10.3)
Chloride: 105 mmol/L (ref 98–111)
Creatinine, Ser: 0.85 mg/dL (ref 0.61–1.24)
GFR calc Af Amer: >60 mL/min (ref >60)
GFR calc non Af Amer: >60 mL/min (ref >60)
Glucose, Bld: 98 mg/dL (ref 70–99)
Potassium: 4.1 mmol/L (ref 3.5–5.1)
Sodium: 140 mmol/L (ref 135–145)
Total Bilirubin: 0.8 mg/dL (ref 0.3–1.2)
Total Protein: 6.4 g/dL — ABNORMAL LOW (ref 6.5–8.1)

## 2018-03-12 LAB — DIFFERENTIAL
Abs Immature Granulocytes: 0 10*3/uL (ref 0.0–0.1)
Basophils Absolute: 0 10*3/uL (ref 0.0–0.1)
Basophils Relative: 1 %
Eosinophils Absolute: 0.1 10*3/uL (ref 0.0–0.7)
Eosinophils Relative: 2 %
Immature Granulocytes: 0 %
Lymphocytes Relative: 36 %
Lymphs Abs: 2.2 10*3/uL (ref 0.7–4.0)
Monocytes Absolute: 0.7 10*3/uL (ref 0.1–1.0)
Monocytes Relative: 11 %
Neutro Abs: 3.1 10*3/uL (ref 1.7–7.7)
Neutrophils Relative %: 50 %

## 2018-03-12 LAB — URINALYSIS, ROUTINE W REFLEX MICROSCOPIC
Bilirubin Urine: NEGATIVE
Glucose, UA: NEGATIVE mg/dL
Hgb urine dipstick: NEGATIVE
Ketones, ur: NEGATIVE mg/dL
Leukocytes, UA: NEGATIVE
Nitrite: NEGATIVE
Protein, ur: NEGATIVE mg/dL
Specific Gravity, Urine: 1.006 (ref 1.005–1.030)
pH: 7 (ref 5.0–8.0)

## 2018-03-12 LAB — MRSA PCR SCREENING: MRSA by PCR: NEGATIVE

## 2018-03-12 LAB — I-STAT TROPONIN, ED: Troponin i, poc: 0 ng/mL (ref 0.00–0.08)

## 2018-03-12 LAB — CBC
HCT: 43.5 % (ref 39.0–52.0)
Hemoglobin: 13.8 g/dL (ref 13.0–17.0)
MCH: 27.5 pg (ref 26.0–34.0)
MCHC: 31.7 g/dL (ref 30.0–36.0)
MCV: 86.8 fL (ref 78.0–100.0)
Platelets: 243 10*3/uL (ref 150–400)
RBC: 5.01 MIL/uL (ref 4.22–5.81)
RDW: 14 % (ref 11.5–15.5)
WBC: 6.1 10*3/uL (ref 4.0–10.5)

## 2018-03-12 LAB — I-STAT CHEM 8, ED
BUN: 16 mg/dL (ref 8–23)
Calcium, Ion: 1.19 mmol/L (ref 1.15–1.40)
Chloride: 104 mmol/L (ref 98–111)
Creatinine, Ser: 0.8 mg/dL (ref 0.61–1.24)
Glucose, Bld: 95 mg/dL (ref 70–99)
HCT: 44 % (ref 39.0–52.0)
Hemoglobin: 15 g/dL (ref 13.0–17.0)
Potassium: 4.2 mmol/L (ref 3.5–5.1)
Sodium: 141 mmol/L (ref 135–145)
TCO2: 26 mmol/L (ref 22–32)

## 2018-03-12 LAB — PROTIME-INR
INR: 1.01
Prothrombin Time: 13.2 seconds (ref 11.4–15.2)

## 2018-03-12 LAB — GLUCOSE, CAPILLARY
Glucose-Capillary: 105 mg/dL — ABNORMAL HIGH (ref 70–99)
Glucose-Capillary: 134 mg/dL — ABNORMAL HIGH (ref 70–99)
Glucose-Capillary: 120 mg/dL — ABNORMAL HIGH (ref 70–99)

## 2018-03-12 LAB — ETHANOL: Alcohol, Ethyl (B): <10 mg/dL (ref <10)

## 2018-03-12 LAB — CBG MONITORING, ED: Glucose-Capillary: 87 mg/dL (ref 70–99)

## 2018-03-12 LAB — APTT: aPTT: 30 seconds (ref 24–36)

## 2018-03-12 MED ORDER — ACETAMINOPHEN 160 MG/5ML PO SOLN: 650.0000 mg | ORAL | Status: DC | PRN

## 2018-03-12 MED ORDER — STROKE: EARLY STAGES OF RECOVERY BOOK
Freq: Once | Status: DC
  Filled 2018-03-12: qty 1

## 2018-03-12 MED ORDER — INFLUENZA VAC SPLIT HIGH-DOSE 0.5 ML IM SUSY
0.5000 mL | PREFILLED_SYRINGE | INTRAMUSCULAR | Status: AC
  Administered 2018-03-13: 0.5 mL via INTRAMUSCULAR
  Filled 2018-03-12: qty 0.5

## 2018-03-12 MED ORDER — INSULIN ASPART 100 UNIT/ML ~~LOC~~ SOLN
0.0000 [IU] | Freq: Three times a day (TID) | SUBCUTANEOUS | Status: DC
  Administered 2018-03-12 – 2018-03-17 (×8): 2 [IU] via SUBCUTANEOUS

## 2018-03-12 MED ORDER — BISOPROLOL-HYDROCHLOROTHIAZIDE 5-6.25 MG PO TABS
1.0000 | ORAL_TABLET | Freq: Every day | ORAL | Status: DC
  Administered 2018-03-13 – 2018-03-14 (×2): 1 via ORAL
  Filled 2018-03-12 (×2): qty 1

## 2018-03-12 MED ORDER — CLEVIDIPINE BUTYRATE 0.5 MG/ML IV EMUL
0.0000 mg/h | INTRAVENOUS | Status: DC
  Administered 2018-03-12: 13 mg/h via INTRAVENOUS
  Administered 2018-03-12: 16 mg/h via INTRAVENOUS
  Administered 2018-03-12 – 2018-03-13 (×5): 14 mg/h via INTRAVENOUS
  Filled 2018-03-12 (×8): qty 50

## 2018-03-12 MED ORDER — ACETAMINOPHEN 325 MG PO TABS
650.0000 mg | ORAL_TABLET | ORAL | Status: DC | PRN
  Administered 2018-03-16 – 2018-03-17 (×6): 650 mg via ORAL
  Filled 2018-03-12 (×6): qty 2

## 2018-03-12 MED ORDER — PANTOPRAZOLE SODIUM 40 MG PO TBEC
40.0000 mg | DELAYED_RELEASE_TABLET | Freq: Every day | ORAL | Status: DC
  Administered 2018-03-12 – 2018-03-17 (×6): 40 mg via ORAL
  Filled 2018-03-12 (×6): qty 1

## 2018-03-12 MED ORDER — FAMOTIDINE IN NACL 20-0.9 MG/50ML-% IV SOLN: 20.0000 mg | Freq: Two times a day (BID) | INTRAVENOUS | Status: DC

## 2018-03-12 MED ORDER — ACETAMINOPHEN 650 MG RE SUPP: 650.0000 mg | RECTAL | Status: DC | PRN

## 2018-03-12 MED ORDER — LABETALOL HCL 5 MG/ML IV SOLN
20.0000 mg | Freq: Once | INTRAVENOUS | Status: AC
  Administered 2018-03-12: 10 mg via INTRAVENOUS

## 2018-03-12 MED ORDER — SENNOSIDES-DOCUSATE SODIUM 8.6-50 MG PO TABS
1.0000 | ORAL_TABLET | Freq: Two times a day (BID) | ORAL | Status: DC
  Administered 2018-03-12 – 2018-03-17 (×10): 1 via ORAL
  Filled 2018-03-12 (×10): qty 1

## 2018-03-12 MED ORDER — METFORMIN HCL 850 MG PO TABS
850.0000 mg | ORAL_TABLET | Freq: Two times a day (BID) | ORAL | Status: DC
  Administered 2018-03-13 – 2018-03-17 (×9): 850 mg via ORAL
  Filled 2018-03-12 (×11): qty 1

## 2018-03-12 MED ORDER — CLEVIDIPINE BUTYRATE 0.5 MG/ML IV EMUL
0.0000 mg/h | INTRAVENOUS | Status: DC
  Administered 2018-03-12: 1 mg/h via INTRAVENOUS

## 2018-03-12 MED ORDER — INSULIN ASPART 100 UNIT/ML ~~LOC~~ SOLN: 0.0000 [IU] | Freq: Every day | SUBCUTANEOUS | Status: DC

## 2018-03-12 NOTE — ED Provider Notes (Signed)
[facility] EMERGENCY DEPARTMENT Provider Note   CSN: [id number] Arrival date & time: 03/12/18  1108  LEVEL 5 CAVEAT - ACUITY OF SITUATION   History   Chief Complaint Chief Complaint  Patient presents with  . Code Stroke    HPI Marcus Beasley is a 72 y.o. male.  HPI  72 year old male with a history of diabetes and hypertension presents with strokelike symptoms.  Was called a code stroke by EMS as all of his symptoms started around 10 AM with him previously being normal just prior to that.  He had woken up and around 9 without difficulty.  The patient has left-sided weakness and some left-sided facial droop.  Some slurred speech.  Has been hypertensive for EMS.  He was trying to take medicines and dropped it out of his left hand which is when he noticed things were wrong.  He bent down to pick it up and could not get back up.  Past Medical History:  Diagnosis Date  . BPH (benign prostatic hyperplasia)    (-) Bx 2015  . Diabetes mellitus without complication ([place])   . Elevated PSA    Prostate Bx in 06/2013 was benign  . HTN (hypertension)   . Hyperlipidemia   . Macular degeneration, age related     Patient Active Problem List   Diagnosis Date Noted  . ICH (intracerebral hemorrhage) ([place]) 03/12/2018  . Prostate cancer ([place]) 01/29/2017  . 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 Diabetes ([place]) 12/14/2015  . PCP NOTES >>>>>>>>>>>>>>>>>>>>>>>>>>>>>>. 08/06/2015  . Dizziness and giddiness 01/29/2015  . Umbilical hernia 24/02/7352  . Elevated PSA, less than 10 ng/ml 04/19/2012  . Annual physical exam 01/24/2011  . Hyperlipidemia 01/03/2010  . Essential hypertension 09/20/2007    Past Surgical History:  Procedure Laterality Date  . ABCESS DRAINAGE     abdomen- 26 day hospitalization 1968  . COLONOSCOPY  2016  . HERNIA REPAIR  summer '11   umbilical, dr Ninfa Linden   . POLYPECTOMY    . PROSTATE BIOPSY  06-2013 , 07-2017   (-), (-)         Home Medications    Prior to Admission medications   Medication Sig Start Date End Date Taking? Authorizing Provider  aspirin EC 81 MG tablet Take 81 mg by mouth daily.    [provider]  atorvastatin (LIPITOR) 20 MG tablet Take 1 tablet (20 mg total) by mouth daily. 09/18/17   Colon Branch, MD  bisoprolol-hydrochlorothiazide [facility]) 5-6.25 MG tablet Take 1 tablet by mouth daily. 12/16/17   Colon Branch, MD  Lancets [facility] ULTRASOFT) lancets Check blood sugar no more than twice daily. 12/14/15   Colon Branch, MD  meclizine (ANTIVERT) 25 MG tablet Take 1 tablet (25 mg total) by mouth as needed for dizziness. 10/07/17   Colon Branch, MD  metFORMIN (GLUCOPHAGE) 850 MG tablet Take 1 tablet (850 mg total) by mouth 2 (two) times daily with a meal. 11/17/17   Colon Branch, MD  Multiple Vitamins-Minerals (EYE VITAMINS PO) Take 1 tablet by mouth daily.    [provider]  multivitamin-iron-minerals-folic acid (CENTRUM) chewable tablet Chew 1 tablet by mouth daily.    [provider]  [facility] VERIO test strip CHECK BLOOD SUGAR NO MORE THAN TWICE DAILY 06/18/17   Colon Branch, MD  sildenafil (VIAGRA) 100 MG tablet Take 100 mg by mouth as needed for erectile dysfunction. Reported on 12/14/2015    [provider]    Family History Family History  Problem Relation Age of Onset  . Leukemia Mother   . Heart attack Father        MI age 69  . Heart disease Sister        age 69  . Cancer - Other Sister        type  . Heart disease Brother        age 17  . Kidney disease Brother        HD  . Diabetes Maternal Grandmother   . Prostate cancer Neg Hx   . Colon cancer Neg Hx   . Esophageal cancer Neg Hx   . Rectal cancer Neg Hx   . Stomach cancer Neg Hx     Social History Social History   Tobacco Use  . Smoking status: Never Smoker  . Smokeless tobacco: Never Used  Substance Use Topics  . Alcohol use: Yes    Comment: occ. beer  . Drug use: No      Allergies   Patient has no known allergies.   Review of Systems Review of Systems  Gastrointestinal: Negative for vomiting.  Neurological: Positive for weakness and numbness. Negative for headaches.  All other systems reviewed and are negative.    Physical Exam Updated Vital Signs BP 128/74   Pulse 77   Temp (!) 97.4 F (36.3 C) (Oral)   Resp 18   Ht 5\' 11"  (1.803 m)   Wt 84.7 kg   SpO2 95%   BMI 26.04 kg/m   Physical Exam  Constitutional: He appears well-developed and well-nourished. No distress.  HENT:  Head: Normocephalic and atraumatic.  Right Ear: External ear normal.  Left Ear: External ear normal.  Nose: Nose normal.  Eyes: Right eye exhibits no discharge. Left eye exhibits no discharge.  Neck: Neck supple.  Cardiovascular: Normal rate, regular rhythm and normal heart sounds.  Pulmonary/Chest: Effort normal and breath sounds normal.  Abdominal: Soft. There is no tenderness.  Musculoskeletal: He exhibits no edema.  Neurological: He is alert.  Left sided facial droop. Unable to lift left arm off stretcher. Left leg can barely get off stretcher but then falls back down.  Right-sided strength is normal.  Skin: Skin is warm and dry. He is not diaphoretic.  Nursing note and vitals reviewed.    ED Treatments / Results  Labs (all labs ordered are listed, but only abnormal results are displayed) Labs Reviewed  COMPREHENSIVE METABOLIC PANEL - Abnormal; Notable for the following components:      Result Value   Total Protein 6.4 (*)    All other components within normal limits  GLUCOSE, CAPILLARY - Abnormal; Notable for the following components:   Glucose-Capillary 105 (*)    All other components within normal limits  MRSA PCR SCREENING  ETHANOL  PROTIME-INR  APTT  CBC  DIFFERENTIAL  RAPID URINE DRUG SCREEN, HOSP PERFORMED  URINALYSIS, ROUTINE W REFLEX MICROSCOPIC  I-STAT CHEM 8, ED  I-STAT TROPONIN, ED  CBG MONITORING, ED     EKG None  Radiology Ct Head Code Stroke Wo Contrast  Result Date: 03/12/2018 CLINICAL DATA:  Code stroke. Left-sided facial droop. Left leg weakness. EXAM: CT HEAD WITHOUT CONTRAST TECHNIQUE: Contiguous axial images were obtained from the base of the skull through the vertex without intravenous contrast. COMPARISON:  None. FINDINGS: Brain: 1.5 x 2.8 x 1.9 cm high-density hematoma in the right thalamus and adjacent corona radiata. Thin rim of surrounding edema. No indication of  underlying infarct or mass. No hydrocephalus or extra-axial collection. No ventricular or subarachnoid extension. Vascular: Mild arterial calcification. Skull: No acute finding. Sinuses/Orbits: Negative Other: Critical Value/emergent results were called by telephone at the time of interpretation on 03/12/2018 at 11:23 am to Dr. Rory Percy , who verbally acknowledged these results. ASPECTS [facility] Stroke Program Early CT Score) Not scored in the setting of acute hemorrhage. IMPRESSION: 4 cc acute hematoma in the right thalamus and corona radiata, often hypertensive in this location. Electronically Signed   By: Monte Fantasia M.D.   On: 03/12/2018 11:27    Procedures .Critical Care Performed by: Sherwood Gambler, MD Authorized by: Sherwood Gambler, MD   Critical care provider statement:    Critical care time (minutes):  30   Critical care time was exclusive of:  Separately billable procedures and treating other patients   Critical care was necessary to treat or prevent imminent or life-threatening deterioration of the following conditions:  CNS failure or compromise   Critical care was time spent personally by me on the following activities:  Development of treatment plan with patient or surrogate, discussions with consultants, evaluation of patient's response to treatment, examination of patient, obtaining history from patient or surrogate, ordering and performing treatments and interventions, ordering and review of laboratory  studies, ordering and review of radiographic studies, pulse oximetry, re-evaluation of patient's condition and review of old charts   (including critical care time)  Medications Ordered in ED Medications   stroke: mapping our early stages of recovery book (has no administration in time range)  acetaminophen (TYLENOL) tablet 650 mg (has no administration in time range)    Or  acetaminophen (TYLENOL) solution 650 mg (has no administration in time range)    Or  acetaminophen (TYLENOL) suppository 650 mg (has no administration in time range)  senna-docusate (Senokot-S) tablet 1 tablet (has no administration in time range)  famotidine (PEPCID) IVPB 20 mg premix (has no administration in time range)  labetalol (NORMODYNE,TRANDATE) injection 20 mg (10 mg Intravenous Given 03/12/18 1118)    And  clevidipine (CLEVIPREX) infusion 0.5 mg/mL (has no administration in time range)  insulin aspart (novoLOG) injection 0-15 Units (has no administration in time range)  insulin aspart (novoLOG) injection 0-5 Units (has no administration in time range)     Initial Impression / Assessment and Plan / ED Course  I have reviewed the triage vital signs and the nursing notes.  Pertinent labs & imaging results that were available during my care of the patient were reviewed by me and considered in my medical decision making (see chart for details).     Patient overall appears well except for his left-sided deficits.  However he is able to speak clearly and does not have any signs or symptoms of impending respiratory failure.  Initially hypertensive, up to 161 systolic and thus was given a small labetalol dose and placed on Cleviprex.  Blood pressure has responded well to this.  He will need to go to the neuro ICU.  Final Clinical Impressions(s) / ED Diagnoses   Final diagnoses:  Hemorrhagic stroke [facility])    ED Discharge Orders    None       Sherwood Gambler, MD 03/12/18 1308

## 2018-03-12 NOTE — H&P (Addendum)
NEURO HOSPITALIST  H&P   Chief Complaint: left facial droop/ facial numbness,   History obtained from:  Patient   HPI:                                                                                                                                         Marcus Beasley is an 72 y.o. male with PMH significant for HTN, HLD,and DM who presented to [facility] ED as a code stroke for facial numbness, slurred speech, leg weakness, and facial droop.   Patient woke up this morning about 0600 in his usual state of health. He proceeded to take the dog outside, eat breakfast and then sat down to watch T. Patient stated that while he was watching TV his left arm began " to feel like it was sleep". He still went on about his daily activities and went to take his daily ASA. He dropped the ASA on the floor and when he bent down to get it, he felt that something was wrong and so he sat down on the floor. EMS was called. Denies any CP, SOB, HA or vision problems. Patient lives with his wife. Denies any prior stroke history or anticoagulation use. Endorses taking a daily baby aspirin.   BP: 168/98 BG: 120 CT Head obtained and showed an ICH.   Date last known well: Date: 03/12/2018 Time last known well: Time: 09:45 tPA Given: No: contraindicated; ICH        Modified Rankin: Rankin Score=1 NIHSS:10 Intracerebral Hemorrhage (ICH) Score Total:  0  Past Medical History:  Diagnosis Date  . BPH (benign prostatic hyperplasia)    (-) Bx 2015  . Diabetes mellitus without complication ([place])   . Elevated PSA    Prostate Bx in 06/2013 was benign  . HTN (hypertension)   . Hyperlipidemia   . Macular degeneration, age related     Past Surgical History:  Procedure Laterality Date  . ABCESS DRAINAGE     abdomen- 26 day hospitalization 1968  . COLONOSCOPY  2016  . HERNIA REPAIR  summer '11   umbilical, dr Ninfa Linden   . POLYPECTOMY    . PROSTATE BIOPSY  06-2013 , 07-2017    (-), (-)    Family History  Problem Relation Age of Onset  . Leukemia Mother   . Heart attack Father        MI age 54  . Heart disease Sister        age 66  . Cancer - Other Sister        type  . Heart disease Brother  age 29  . Kidney disease Brother        HD  . Diabetes Maternal Grandmother   . Prostate cancer Neg Hx   . Colon cancer Neg Hx   . Esophageal cancer Neg Hx   . Rectal cancer Neg Hx   . Stomach cancer Neg Hx          Social History:  reports that he has never smoked. He has never used smokeless tobacco. He reports that he drinks alcohol. He reports that he does not use drugs.  Allergies: No Known Allergies  Medications:                                                                                                                           Current Facility-Administered Medications  Medication Dose Route Frequency Provider Last Rate Last Dose  .  stroke: mapping our early stages of recovery book   Does not apply Once Williams, Jessica N, NP      . 0.9 %  sodium chloride infusion  500 mL Intravenous Once Pyrtle, Lajuan Lines, MD      . acetaminophen (TYLENOL) tablet 650 mg  650 mg Oral Q4H PRN Vonzella Nipple, NP       Or  . acetaminophen (TYLENOL) solution 650 mg  650 mg Per Tube Q4H PRN Vonzella Nipple, NP       Or  . acetaminophen (TYLENOL) suppository 650 mg  650 mg Rectal Q4H PRN Vonzella Nipple, NP      . labetalol (NORMODYNE,TRANDATE) injection 20 mg  20 mg Intravenous Once Vonzella Nipple, NP       And  . clevidipine (CLEVIPREX) infusion 0.5 mg/mL  0-21 mg/hr Intravenous Continuous Vonzella Nipple, NP      . famotidine (PEPCID) IVPB 20 mg premix  20 mg Intravenous Q12H Vonzella Nipple, NP      . insulin aspart (novoLOG) injection 0-15 Units  0-15 Units Subcutaneous TID WC Vonzella Nipple, NP      . insulin aspart (novoLOG) injection 0-5 Units  0-5 Units Subcutaneous QHS Vonzella Nipple, NP      . senna-docusate  (Senokot-S) tablet 1 tablet  1 tablet Oral BID Vonzella Nipple, NP       Current Outpatient Medications  Medication Sig Dispense Refill  . aspirin EC 81 MG tablet Take 81 mg by mouth daily.    Marland Kitchen atorvastatin (LIPITOR) 20 MG tablet Take 1 tablet (20 mg total) by mouth daily. 90 tablet 1  . bisoprolol-hydrochlorothiazide (ZIAC) 5-6.25 MG tablet Take 1 tablet by mouth daily. 90 tablet 1  . Lancets (ONETOUCH ULTRASOFT) lancets Check blood sugar no more than twice daily. 100 each 12  . meclizine (ANTIVERT) 25 MG tablet Take 1 tablet (25 mg total) by mouth as needed for dizziness. 30 tablet 1  . metFORMIN (GLUCOPHAGE) 850 MG tablet Take 1 tablet (850 mg total) by mouth 2 (two) times daily with a meal.  180 tablet 1  . Multiple Vitamins-Minerals (EYE VITAMINS PO) Take 1 tablet by mouth daily.    . multivitamin-iron-minerals-folic acid (CENTRUM) chewable tablet Chew 1 tablet by mouth daily.    Glory [facility] VERIO test strip CHECK BLOOD SUGAR NO MORE THAN TWICE DAILY 200 each 12  . sildenafil (VIAGRA) 100 MG tablet Take 100 mg by mouth as needed for erectile dysfunction. Reported on 12/14/2015      ROS:                                                                                                                                       A ROS was completed and is negative except as stated in HPI.  General Examination:                                                                                                      Height 5\' 11"  (1.803 m), weight 85.8 kg.  HEENT-  Normocephalic, no lesions, without obvious abnormality.  Normal external eye and conjunctiva.  Cardiovascular- S1-S2 audible, pulses palpable throughout   Lungs-no rhonchi or wheezing noted, no excessive working breathing.  Saturations within normal limits on RA Abdomen- BS present x 4 quadrants Extremities- Warm, dry and intact Musculoskeletal-no joint tenderness, deformity or swelling Skin-warm and dry, no hyperpigmentation, vitiligo,  or suspicious lesions  Neurological Examination Mental Status: Alert, oriented, thought content appropriate.  Speech fluent without evidence of aphasia. Slight dysarthria noted.  Able to follow  commands without difficulty. Cranial Nerves: II:  Visual fields grossly normal,  III,IV, VI: ptosis not present, extra-ocular motions intact bilaterally, pupils equal, round, reactive to light  V,VII: smile asymmetric,left facial droop noted,  facial light touch sensation decreased on left side of face.  VIII: hearing normal bilaterally IX,X: uvula rises symmetrically XI: bilateral shoulder shrug XII: midline tongue extension Motor: Right : Upper extremity   5/5    Left:     Upper extremity  1/5  Lower extremity   5/5     Lower extremity   3/5 Tone and bulk:normal tone throughout; no atrophy noted Sensory:  light touch  Decreased on left side Deep Tendon Reflexes: 2+ and symmetric biceps and patella Plantars: Right: downgoing   Left: downgoing Cerebellar: normal finger-to-nose in right arm, unable to perform on the left side.  normal heel-to-shin test on right side. Unable to perform on the left.  Gait: deferred   Lab Results: Basic Metabolic Panel: Recent Labs  Lab 03/12/18 1118  NA  141  K 4.2  CL 104  GLUCOSE 95  BUN 16  CREATININE 0.80    CBC: Recent Labs  Lab 03/12/18 1118 03/12/18 1119  WBC  --  6.1  NEUTROABS  --  3.1  HGB 15.0 13.8  HCT 44.0 43.5  MCV  --  86.8  PLT  --  243   CBG: Recent Labs  Lab 03/12/18 1110  GLUCAP 87    Imaging: Ct Head Code Stroke Wo Contrast  Result Date: 03/12/2018  IMPRESSION: 4 cc acute hematoma in the right thalamus and corona radiata, often hypertensive in this location.     Laurey Morale, MSN, NP-C [facility] [phone]  03/12/2018, 11:18 AM   Attending physician note to follow with Assessment and plan .   Assessment: 72 y.o. male  with PMH significant for HTN, HLD,and DM who presented to [facility] ED as a  code stroke for facial numbness, slurred speech, leg weakness, and facial droop. CT head revealed a 4 cc acute hematoma in the right thalamus and corona radiata most likely d/t HTN. Will admit to ICU for monitoring    Stroke Risk Factors - diabetes mellitus, hyperlipidemia and hypertension   Plan:  ICH, nontraumatic Acuity: Acute Current suspected etiology:  HTN Treatment: aggressive BP control -Admit to ICU -ICH Score:0 -ICH Volume:4CC -BP control goal SYS<140   CNS -ICH  -Close neuro monitoring  -Dysarthria -NPO until swallow screen completed -Advance diet as tolerated  -Hemiplegia and hemiparesis following nontraumatic intracerebral hemorrhage affecting left non-dominant side  - PT/OT/ST  RESP -No active issue -continue to monitor  CV -Aggressive BP control, goal SBP < 140 -Titrate cleviprex  GI/GU -Gentle hydration   HEME -Monitor   ENDO Type 2 diabetes mellitus with hyperglycemia  -SSI -goal HgbA1c < 7  Fluid/Electrolyte Disorders -gentle hydration  ID Possible Aspiration PNA -CXR -NPO -Monitor  HLD: LDL< 70  Prophylaxis DVT: SCD Bowel: senna/doc  Dispo: IP Rehab vs Home   Diet: NPO until swallow evaluation completed  Code Status: Full Code      --please page stroke NP  Or  PA  Or MD from 8am -4 pm  as this patient from this time will be  followed by the stroke.   You can look them up on [email or web address]  Password [email or web address]   NEUROHOSPITALIST ADDENDUM Performed a face to face diagnostic evaluation.   I have reviewed the contents of history and physical exam as documented by PA/ARNP/Resident and agree with above documentation.  I have discussed and formulated the above plan as documented. Edits to the note have been made as needed.  Impression: Right thalamic ICH, due to uncontrolled hypertension.  Blood pressure immediately controlled on labetalol and Cleviprex.  Patient has significant weakness on the left side and likely need rehab.  Patient admitted to the ICU for close monitoring and tight blood pressure control.     Karena Addison Aroor MD [facility] [phone]   If 7pm to 7am, please call on call as listed on AMION.   Patient is neurologically critically ill due to right thalamic hemorrhage.  Patient can worsen due to increase in hemorrhage and his risk for seizures, infection, respiratory failure.  He requires tight blood pressure monitoring using IV blood pressure medications that needs constant titration.  50 minutes of neuro critical care time spent in the management of this patient.

## 2018-03-12 NOTE — ED Triage Notes (Signed)
Pt BIB [facility] for eval of L sided weakness, slurred speech, L sided facial droop. LKN @ 0945 this morning, no hx of CVA. On arrival pt is GCS 15, A&Ox4, L sided facial droop, L sided arm/leg weakness w/ drift. CBG 87 on arrival,VSS.

## 2018-03-12 NOTE — Code Documentation (Signed)
72yo male arriving to [facility] via [place] at 82. Patient from home where he reports waking up at his baseline. He was LKW at [place]. He noted around 1000 that his left arm felt like he had been laying on it, but was able to get up and go get laundry out of the dryer. He proceeded to drop something and when he reached to get it he had left sided weakness. EMS called and activated a code stroke for left facial droop, left sided weakness and slurred speech. Stroke team to the bedside on patient arrival. Labs drawn and patient to CT with team. CT completed showing ICH. SBP 170s, Labetalol given followed by Cleviprex gtt for a goal of SBP <140. Patient with h/o hypertension with SBP 140s or higher at home per patient. NIHSS 10, see documentation for details and code stroke times. Patient with left facial droop, left arm flaccid, left leg weakness, left sided decreased sensation and mild dysarthria. Family updated on plan of care. Report given to Roselyn Reef, RN on 4N, via telephone. Patient transferred to 4N24 by Stroke RN. BP remained in goal. Bedside handoff to 4N RNs on arrival to unit.

## 2018-03-12 NOTE — Progress Notes (Signed)
OT Cancellation Note  Patient Details Name: RODRECUS BELSKY MRN: [id number] DOB: 25-Aug-1945   Cancelled Treatment:    Reason Eval/Treat Not Completed: Active bedrest order  [place], OT/L   Acute OT Clinical Specialist Acute Rehabilitation Services Pager [phone] Office [phone]  03/12/2018, 4:13 PM

## 2018-03-13 ENCOUNTER — Inpatient Hospital Stay (HOSPITAL_COMMUNITY): Payer: Medicare Other

## 2018-03-13 DIAGNOSIS — I161 Hypertensive emergency: Secondary | ICD-10-CM

## 2018-03-13 DIAGNOSIS — E1159 Type 2 diabetes mellitus with other circulatory complications: Secondary | ICD-10-CM

## 2018-03-13 LAB — LIPID PANEL
Cholesterol: 165 mg/dL (ref 0–200)
HDL: 40 mg/dL — ABNORMAL LOW (ref >40)
LDL Cholesterol: 94 mg/dL (ref 0–99)
Total CHOL/HDL Ratio: 4.1 RATIO
Triglycerides: 153 mg/dL — ABNORMAL HIGH (ref <150)
VLDL: 31 mg/dL (ref 0–40)

## 2018-03-13 LAB — ECHOCARDIOGRAM COMPLETE
Height: 71 in
Weight: 2987.67 oz

## 2018-03-13 LAB — BASIC METABOLIC PANEL
Anion gap: 12 (ref 5–15)
BUN: 11 mg/dL (ref 8–23)
CO2: 25 mmol/L (ref 22–32)
Calcium: 9.3 mg/dL (ref 8.9–10.3)
Chloride: 104 mmol/L (ref 98–111)
Creatinine, Ser: 0.83 mg/dL (ref 0.61–1.24)
GFR calc Af Amer: >60 mL/min (ref >60)
GFR calc non Af Amer: >60 mL/min (ref >60)
Glucose, Bld: 133 mg/dL — ABNORMAL HIGH (ref 70–99)
Potassium: 3.7 mmol/L (ref 3.5–5.1)
Sodium: 141 mmol/L (ref 135–145)

## 2018-03-13 LAB — HEMOGLOBIN A1C
Hgb A1c MFr Bld: 6.4 % — ABNORMAL HIGH (ref 4.8–5.6)
Mean Plasma Glucose: 136.98 mg/dL

## 2018-03-13 LAB — CBC
HCT: 44.8 % (ref 39.0–52.0)
Hemoglobin: 14.5 g/dL (ref 13.0–17.0)
MCH: 28 pg (ref 26.0–34.0)
MCHC: 32.4 g/dL (ref 30.0–36.0)
MCV: 86.5 fL (ref 78.0–100.0)
Platelets: 258 10*3/uL (ref 150–400)
RBC: 5.18 MIL/uL (ref 4.22–5.81)
RDW: 14.1 % (ref 11.5–15.5)
WBC: 7.5 10*3/uL (ref 4.0–10.5)

## 2018-03-13 LAB — TSH: TSH: 0.549 u[IU]/mL (ref 0.350–4.500)

## 2018-03-13 LAB — GLUCOSE, CAPILLARY
Glucose-Capillary: 122 mg/dL — ABNORMAL HIGH (ref 70–99)
Glucose-Capillary: 132 mg/dL — ABNORMAL HIGH (ref 70–99)
Glucose-Capillary: 131 mg/dL — ABNORMAL HIGH (ref 70–99)
Glucose-Capillary: 109 mg/dL — ABNORMAL HIGH (ref 70–99)

## 2018-03-13 MED ORDER — DILTIAZEM LOAD VIA INFUSION
10.0000 mg | Freq: Once | INTRAVENOUS | Status: AC
  Administered 2018-03-13: 10 mg via INTRAVENOUS
  Filled 2018-03-13: qty 10

## 2018-03-13 MED ORDER — GADOBUTROL 1 MMOL/ML IV SOLN
7.0000 mL | Freq: Once | INTRAVENOUS | Status: AC | PRN
  Administered 2018-03-13: 7 mL via INTRAVENOUS

## 2018-03-13 MED ORDER — METOPROLOL TARTRATE 5 MG/5ML IV SOLN
INTRAVENOUS | Status: AC
  Administered 2018-03-13: 5 mg
  Filled 2018-03-13: qty 5

## 2018-03-13 MED ORDER — METOPROLOL TARTRATE 5 MG/5ML IV SOLN
5.0000 mg | Freq: Once | INTRAVENOUS | Status: DC
  Filled 2018-03-13: qty 5

## 2018-03-13 MED ORDER — DILTIAZEM HCL-DEXTROSE 100-5 MG/100ML-% IV SOLN (PREMIX)
5.0000 mg/h | INTRAVENOUS | Status: DC
  Administered 2018-03-13 (×2): 5 mg/h via INTRAVENOUS
  Administered 2018-03-15: 7.5 mg/h via INTRAVENOUS
  Filled 2018-03-13 (×4): qty 100

## 2018-03-13 NOTE — Evaluation (Signed)
Physical Therapy Evaluation Patient Details Name: Marcus Beasley MRN: [id number] DOB: 1946-05-28 Today's Date: 03/13/2018   History of Present Illness  72 y.o. male admitted with facial numbness, slurred speech, leg weakness, and facial droop with right thalamic and corona radiata ICH. PMhx: macular degeneration, HLD, HTN, DM and BPH   Clinical Impression  Pt pleasant on arrival, visiting with many family members. Pt reports previously fully independent. Pt max +1 for bed mobility, mod +2 to stand, and max+2 to maintain standing and transfer. Pt with strong left lateral lean, decreased sensation of LUE and LLE with random minimal sparing. Pt with decreased strength, coordination, mobility, balance, and awareness of deficits on LUE and LLE (see Pt problem list below for all). Pt will benefit acutely from skilled therapy to address above deficits in order to increase functional mobility with transfers and locomotion, increase independence and to decrease fall risk and care giver burden.     Follow Up Recommendations CIR;Supervision for mobility/OOB    Equipment Recommendations  Other (comment);3in1 (PT);Wheelchair (measurements PT)(TBD )    Recommendations for Other Services Rehab consult     Precautions / Restrictions Precautions Precautions: Fall Restrictions Weight Bearing Restrictions: No      Mobility  Bed Mobility Overal bed mobility: Needs Assistance Bed Mobility: Rolling;Sidelying to Sit Rolling: Mod assist Sidelying to sit: Max assist       General bed mobility comments: roll to left, mod assist to roll with vc's to reach for rail with hand. max assist to sit EOB for trunk elevation, swinging legs off bed. Left lean in sitting, with ability to correct 50% of time with max verbal and tactile cues. Pt strong push to left when uses hand for support. pt cues to place hands in lap.   Transfers Overall transfer level: Needs assistance Equipment used: 2 person hand held  assist Transfers: Sit to/from [facility] Sit to Stand: Mod assist;+2 physical assistance Stand pivot transfers: Max assist;+2 physical assistance       General transfer comment: Three standing trials. mod +2 for physical assist to rise, max +2 to maintain standing with bil knee block. Strong left lean, push to left with RUE and RLE. RLE slides out to side as lean increases to the left. Pt did not respond to cues to correct lean in standing. Max +2 to pivot transfer. Assist to turn hips, pt able to assist by advanceing RLE with small steps, knee block on L unable to advance.   Ambulation/Gait             General Gait Details: unable to assess at this time.   Stairs            Wheelchair Mobility    Modified Rankin (Stroke Patients Only) Modified Rankin (Stroke Patients Only) Pre-Morbid Rankin Score: No symptoms Modified Rankin: Severe disability     Balance Overall balance assessment: Needs assistance Sitting-balance support: Feet supported;Bilateral upper extremity supported Sitting balance-Leahy Scale: Poor Sitting balance - Comments: left lateral lean, strong push to left with RUE, vcs to place right arm on side, able to correct lean 50% of time. assist to maintain seated balance, with pt able to demonstrate holds for ~15 seconds before leaning again.  Postural control: Left lateral lean Standing balance support: Bilateral upper extremity supported;During functional activity Standing balance-Leahy Scale: Zero Standing balance comment: max assist to maintain standing, L knee buckles, max +2 assist to stop left lateral lean and prevent L knee buckle. R leg blocked from abducting.  Pertinent Vitals/Pain Pain Assessment: No/denies pain    Home Living Family/patient expects to be discharged to:: Private residence Living Arrangements: Spouse/significant other Available Help at Discharge:  Family;Friend(s)(daughters can help after work) Type of Home: House Home Access: Stairs to enter Entrance Stairs-Rails: Can reach both Technical brewer of Steps: 6 Home Layout: Multi-level Home Equipment: None      Prior Function Level of Independence: Independent         Comments: retired, driving      Journalist, newspaper   Dominant Hand: Right    Extremity/Trunk Assessment   Upper Extremity Assessment Upper Extremity Assessment: Defer to OT evaluation;LUE deficits/detail LUE Deficits / Details: limited mobility, weak grip LUE Sensation: decreased light touch;decreased proprioception    Lower Extremity Assessment Lower Extremity Assessment: LLE deficits/detail LLE Deficits / Details: limited mobility, strength: hip ADD/ABD/flex/ext 2/5, knee extension/flexion 3/5, dorsiflexion/plantarflexion 2/5  LLE Sensation: decreased light touch(sensation sparing in medial thigh, absent on remainder of LLE ) LLE Coordination: decreased fine motor;decreased gross motor    Cervical / Trunk Assessment Cervical / Trunk Assessment: Kyphotic  Communication   Communication: No difficulties  Cognition Arousal/Alertness: Awake/alert Behavior During Therapy: WFL for tasks assessed/performed Overall Cognitive Status: Impaired/Different from baseline Area of Impairment: Awareness;Memory;Safety/judgement;Following commands;Problem solving                     Memory: Decreased short-term memory Following Commands: Follows one step commands inconsistently Safety/Judgement: Decreased awareness of safety;Decreased awareness of deficits Awareness: Intellectual Problem Solving: Slow processing;Difficulty sequencing;Requires verbal cues;Requires tactile cues;Decreased initiation General Comments: decreased awareness of deficits and strong postural lean, able to correct 50% of the time with verbal cues, responds well to tactile cues on Rt side, decreased to absent sensation on the letft.        General Comments      Exercises General Exercises - Lower Extremity Long Arc Quad: AROM;10 reps;Seated;Left Hip Flexion/Marching: AAROM;10 reps;Left;Seated   Assessment/Plan    PT Assessment Patient needs continued PT services  PT Problem List Decreased strength;Decreased mobility;Decreased safety awareness;Decreased range of motion;Decreased coordination;Decreased activity tolerance;Decreased cognition;Cardiopulmonary status limiting activity;Decreased balance;Decreased knowledge of use of DME;Impaired sensation       PT Treatment Interventions DME instruction;Therapeutic activities;Cognitive remediation;Gait training;Therapeutic exercise;Patient/family education;Stair training;Balance training;Functional mobility training;Neuromuscular re-education    PT Goals (Current goals can be found in the Care Plan section)  Acute Rehab PT Goals Patient Stated Goal: return home  PT Goal Formulation: With patient/family Time For Goal Achievement: 03/26/18 Potential to Achieve Goals: Fair    Frequency Min 4X/week   Barriers to discharge Decreased caregiver support lives with wife, unable to physically assist. can perform ADLS, two daughters can assist after work, granddaughter is Marine scientist and can assist on days off.     Co-evaluation               AM-PAC PT "6 Clicks" Daily Activity  Outcome Measure Difficulty turning over in bed (including adjusting bedclothes, sheets and blankets)?: Unable Difficulty moving from lying on back to sitting on the side of the bed? : Unable Difficulty sitting down on and standing up from a chair with arms (e.g., wheelchair, bedside commode, etc,.)?: Unable Help needed moving to and from a bed to chair (including a wheelchair)?: Total Help needed walking in hospital room?: Total Help needed climbing 3-5 steps with a railing? : Total 6 Click Score: 6    End of Session Equipment Utilized During Treatment: Gait belt Activity Tolerance: Patient  tolerated treatment well Patient left:  in chair;with chair alarm set;with call bell/phone within reach;with family/visitor present Nurse Communication: Mobility status;Need for lift equipment([place] back to bed ) PT Visit Diagnosis: Hemiplegia and hemiparesis;Other symptoms and signs involving the nervous system (R29.898);Other abnormalities of gait and mobility (R26.89);Unsteadiness on feet (R26.81) Hemiplegia - Right/Left: Left Hemiplegia - dominant/non-dominant: Non-dominant Hemiplegia - caused by: Nontraumatic intracerebral hemorrhage    Time: 1211-1244 PT Time Calculation (min) (ACUTE ONLY): 33 min   Charges:   PT Evaluation $PT Eval Moderate Complexity: 1 Mod PT Treatments $Therapeutic Activity: 8-22 mins      Samuella Bruin, [place]  Acute Rehab [phone]   Samuella Bruin 03/13/2018, 1:57 PM

## 2018-03-13 NOTE — Progress Notes (Signed)
PT Cancellation Note  Patient Details Name: Marcus Beasley MRN: [id number] DOB: [birth year]   Cancelled Treatment:    Reason Eval/Treat Not Completed: Active bedrest order   [name] 03/13/2018, 7:00 AM  Elwyn Reach, PT Acute Rehabilitation Services Pager: [phone] Office: [phone]

## 2018-03-13 NOTE — Progress Notes (Signed)
Pt developed sudden onset of A.fib with RVR with 12 lead confirmation. MD notified, see new orders.

## 2018-03-13 NOTE — Progress Notes (Signed)
OT Cancellation Note  Patient Details Name: WELDEN HAUSMANN MRN: [id number] DOB: [birth year]   Cancelled Treatment:    Reason Eval/Treat Not Completed: Patient at procedure or test/ unavailable.  Lucille Passy, OTR/L Acute Rehabilitation Services Pager [phone] Office [phone]   Lucille Passy M 03/13/2018, 4:48 PM

## 2018-03-13 NOTE — Progress Notes (Signed)
  Echocardiogram 2D Echocardiogram has been performed.  Randa Lynn Camil Hausmann 03/13/2018, 3:28 PM

## 2018-03-13 NOTE — Progress Notes (Signed)
STROKE TEAM PROGRESS NOTE   SUBJECTIVE (INTERVAL HISTORY) His wife is at the bedside.  Pt is eating breakfast. He denies HA, N/V. Still has mild left facial droop, left hemiparesis, and left hemiparesthesia. MRI and MRA pending.   OBJECTIVE Vitals:   03/13/18 0500 03/13/18 0600 03/13/18 0700 03/13/18 0800  BP: 123/87 115/72 120/82   Pulse: 81 82 84   Resp: 17 20 18    Temp:    98.8 F (37.1 C)  TempSrc:    Oral  SpO2: 94% 94% 94%   Weight:      Height:        CBC:  Recent Labs  Lab 03/12/18 1119 03/13/18 0400  WBC 6.1 7.5  NEUTROABS 3.1  --   HGB 13.8 14.5  HCT 43.5 44.8  MCV 86.8 86.5  PLT 243 188    Basic Metabolic Panel:  Recent Labs  Lab 03/12/18 1119 03/13/18 0400  NA 140 141  K 4.1 3.7  CL 105 104  CO2 22 25  GLUCOSE 98 133*  BUN 13 11  CREATININE 0.85 0.83  CALCIUM 9.2 9.3    Lipid Panel:     Component Value Date/Time   CHOL 165 03/13/2018 0400   TRIG 153 (H) 03/13/2018 0400   HDL 40 (L) 03/13/2018 0400   CHOLHDL 4.1 03/13/2018 0400   VLDL 31 03/13/2018 0400   LDLCALC 94 03/13/2018 0400   HgbA1c:  Lab Results  Component Value Date   HGBA1C 6.4 (H) 03/13/2018   Urine Drug Screen:     Component Value Date/Time   LABOPIA NONE DETECTED 03/12/2018 1335   COCAINSCRNUR NONE DETECTED 03/12/2018 1335   LABBENZ NONE DETECTED 03/12/2018 1335   AMPHETMU NONE DETECTED 03/12/2018 1335   THCU NONE DETECTED 03/12/2018 1335   LABBARB NONE DETECTED 03/12/2018 1335    Alcohol Level     Component Value Date/Time   ETH <10 03/12/2018 1118    IMAGING   Ct Head Code Stroke Wo Contrast 03/12/2018 IMPRESSION:  4 cc acute hematoma in the right thalamus and corona radiata, often hypertensive in this location.    MRI / MRA Brain WO Contrast - pending   Transthoracic Echocardiogram - pending 00/00/00   Bilateral Carotid Dopplers - pending 00/00/00     PHYSICAL EXAM Temp:  [97.4 F (36.3 C)-98.8 F (37.1 C)] 98.8 F (37.1 C) (09/21  0800) Pulse Rate:  [64-97] 82 (09/21 0900) Resp:  [14-26] 18 (09/21 0900) BP: (91-172)/(69-113) 131/95 (09/21 0900) SpO2:  [91 %-100 %] 97 % (09/21 0900) Weight:  [84.7 kg-85.8 kg] 84.7 kg (09/20 1210)  General - Well nourished, well developed, in no apparent distress.  Ophthalmologic - fundi not visualized due to noncooperation.  Cardiovascular - Regular rate and rhythm.  Mental Status -  Level of arousal and orientation to time, place, and person were intact. Language including expression, naming, repetition, comprehension was assessed and found intact. Fund of Knowledge was assessed and was intact.  Cranial Nerves II - XII - II - Visual field intact OU. III, IV, VI - Extraocular movements intact. V - Facial sensation intact bilaterally. VII - mild left facial droop. VIII - Hearing & vestibular intact bilaterally. X - Palate elevates symmetrically. XI - Chin turning & shoulder shrug intact bilaterally. XII - Tongue protrusion intact.  Motor Strength - The patient's strength was normal in RUE and RLE, but LUE 2/5 proximal, 3-/5 distal, LLE 3/5 proximal and distal and pronator drift was absent.  Bulk was normal and fasciculations were  absent.   Motor Tone - Muscle tone was assessed at the neck and appendages and was normal.  Reflexes - The patient's reflexes were symmetrical in all extremities and he had no pathological reflexes.  Sensory - Light touch, temperature/pinprick were assessed and were decreased on the left, LUE 10% and LLE 50% comparing with right.    Coordination - The patient had normal movements in the right hand with no ataxia or dysmetria.  Tremor was absent.  Gait and Station - deferred.    ASSESSMENT/PLAN Marcus Beasley is a 72 y.o. male with history of hyperlipidemia, hypertension, diabetes mellitus, and BPH presenting with facial numbness, slurred speech, leg weakness, and facial droop. He did not receive IV t-PA due to [place].   ICH - right thalamic  ICH, likely secondary to hypertension.  Resultant  Left hemiparesis, left hemianesthesia  CT head - 4 cc acute hematoma in the right thalamus and corona radiata  MRI head - pending  MRA head - pending  Carotid Doppler - pending  2D Echo - pending  LDL - 94  HgbA1c - 6.4  UDS neg  VTE prophylaxis - SCDs  Diet - Heart healthy / carb modified with thin liquids.  aspirin 81 mg daily prior to admission, now on No antithrombotic  Patient counseled to be compliant with his antithrombotic medications  Ongoing aggressive stroke risk factor management  Therapy recommendations:  pending  Disposition:  Pending  Hypertension  Stable off cleviprex . Systolic blood pressure goal < 140 mmHg . On home po meds - Ziac . Long-term BP goal normotensive  Hyperlipidemia  Lipid lowering medication PTA:  Lipitor 20 mg daily  LDL 94, goal < 70  Current lipid lowering medication: none (consider increasing Lipitor to 40 mg daily at time of discharge)  Diabetes  HgbA1c 6.4, goal < 7.0  Controlled  On metformin  SSI  CBG monitoring  Other Stroke Risk Factors  Advanced age  ETOH use, advised to drink no more than 1 alcoholic beverage per day   Other Active Problems  Hospital day # 1  This patient is critically ill due to [place], hypertensive emergency and at significant risk of neurological worsening, death form hematoma expansion, heart failure, seizure, hypertensive encephalopathy. This patient's care requires constant monitoring of vital signs, hemodynamics, respiratory and cardiac monitoring, review of multiple databases, neurological assessment, discussion with family, other specialists and medical decision making of high complexity. I spent 35 minutes of neurocritical care time in the care of this patient.  Rosalin Hawking, MD PhD Stroke Neurology 03/13/2018 9:52 AM     To contact Stroke Continuity provider, please refer to [email or web address] After hours, contact General  Neurology

## 2018-03-14 ENCOUNTER — Inpatient Hospital Stay (HOSPITAL_COMMUNITY): Payer: Medicare Other

## 2018-03-14 DIAGNOSIS — I4891 Unspecified atrial fibrillation: Secondary | ICD-10-CM

## 2018-03-14 DIAGNOSIS — I619 Nontraumatic intracerebral hemorrhage, unspecified: Secondary | ICD-10-CM

## 2018-03-14 LAB — BASIC METABOLIC PANEL
Anion gap: 10 (ref 5–15)
BUN: 18 mg/dL (ref 8–23)
CO2: 23 mmol/L (ref 22–32)
Calcium: 9 mg/dL (ref 8.9–10.3)
Chloride: 105 mmol/L (ref 98–111)
Creatinine, Ser: 1.08 mg/dL (ref 0.61–1.24)
GFR calc Af Amer: >60 mL/min (ref >60)
GFR calc non Af Amer: >60 mL/min (ref >60)
Glucose, Bld: 114 mg/dL — ABNORMAL HIGH (ref 70–99)
Potassium: 4 mmol/L (ref 3.5–5.1)
Sodium: 138 mmol/L (ref 135–145)

## 2018-03-14 LAB — GLUCOSE, CAPILLARY
Glucose-Capillary: 106 mg/dL — ABNORMAL HIGH (ref 70–99)
Glucose-Capillary: 132 mg/dL — ABNORMAL HIGH (ref 70–99)
Glucose-Capillary: 143 mg/dL — ABNORMAL HIGH (ref 70–99)
Glucose-Capillary: 114 mg/dL — ABNORMAL HIGH (ref 70–99)

## 2018-03-14 LAB — CBC
HCT: 45.5 % (ref 39.0–52.0)
Hemoglobin: 14.5 g/dL (ref 13.0–17.0)
MCH: 27.9 pg (ref 26.0–34.0)
MCHC: 31.9 g/dL (ref 30.0–36.0)
MCV: 87.5 fL (ref 78.0–100.0)
Platelets: 251 10*3/uL (ref 150–400)
RBC: 5.2 MIL/uL (ref 4.22–5.81)
RDW: 14.4 % (ref 11.5–15.5)
WBC: 9.8 10*3/uL (ref 4.0–10.5)

## 2018-03-14 MED ORDER — HEPARIN SODIUM (PORCINE) 5000 UNIT/ML IJ SOLN
5000.0000 [IU] | Freq: Three times a day (TID) | INTRAMUSCULAR | Status: DC
  Administered 2018-03-14 – 2018-03-17 (×10): 5000 [IU] via SUBCUTANEOUS
  Filled 2018-03-14 (×10): qty 1

## 2018-03-14 MED ORDER — DEXTROSE 50 % IV SOLN
INTRAVENOUS | Status: AC
  Filled 2018-03-14: qty 50

## 2018-03-14 MED ORDER — METOPROLOL TARTRATE 5 MG/5ML IV SOLN
5.0000 mg | Freq: Once | INTRAVENOUS | Status: AC
  Administered 2018-03-14: 5 mg via INTRAVENOUS

## 2018-03-14 MED ORDER — METOPROLOL TARTRATE 5 MG/5ML IV SOLN
INTRAVENOUS | Status: AC
  Filled 2018-03-14: qty 5

## 2018-03-14 MED ORDER — METOPROLOL TARTRATE 50 MG PO TABS
50.0000 mg | ORAL_TABLET | Freq: Two times a day (BID) | ORAL | Status: DC
  Administered 2018-03-14 – 2018-03-17 (×7): 50 mg via ORAL
  Filled 2018-03-14 (×7): qty 1

## 2018-03-14 NOTE — Evaluation (Signed)
Occupational Therapy Evaluation Patient Details Name: Marcus Beasley MRN: [id number] DOB: 06/21/1946 Today's Date: 03/14/2018    History of Present Illness 72 y.o. male admitted with facial numbness, slurred speech, leg weakness, and facial droop with right thalamic and corona radiata ICH. PMhx: macular degeneration, HLD, HTN, DM and BPH    Clinical Impression   Pt admitted with above. He demonstrates the below listed deficits and will benefit from continued OT to maximize safety and independence with BADLs.  Pt presents to OT with Lt hemiparesis, impaired cognition, Lt inattention, pusher syndrome, impaired balance.   He currently requires mod - max A for ADLs.   He was able to progress to min guard assist for LB ADLs while working on reaching to Rt to improve midline orientation - this appears to be significant improvement compared to performance with PT yesterday.   He was fully independent PTA, retired from [employer] In Feb 2019, and was driving.  Wife is very supportive.  Feel he needs the intensity, consistency and expertise of a therapy team that specializes in stroke rehab to allow him to maximize safety and independence with ADLs, reduce risk of falls, and reduce risk of readmission - recommend CIR.       Follow Up Recommendations  CIR;Supervision/Assistance - 24 hour    Equipment Recommendations  None recommended by OT    Recommendations for Other Services Rehab consult     Precautions / Restrictions Precautions Precautions: Fall Restrictions Weight Bearing Restrictions: No      Mobility Bed Mobility Overal bed mobility: Needs Assistance Bed Mobility: Sit to Supine;Supine to Sit     Supine to sit: Mod assist Sit to supine: Mod assist   General bed mobility comments: max verbal cues for sequencing and control of movement.  mod - max facilitation of Lt UE and LE to assist   Transfers                 General transfer comment: Did not attempt with +1 due to  safety concerns and pushing.  Did work on sit to partial stand, keeping pt in flexed position to avoid full pushing - he required max A     Balance Overall balance assessment: Needs assistance Sitting-balance support: Feet supported;No upper extremity supported Sitting balance-Leahy Scale: Poor Sitting balance - Comments: Pt initially requiring mod A due to heavy pushing with Rt UE and Rt LE, however, when pt provided with target, and min facilitation, he was able to achieve and maintain midline posture and worked on reaching to Rt off his BOS with Rt UE and min facilitation.  He progressed to close min gaurd assist for EOB sitting with no UE support                                    ADL either performed or assessed with clinical judgement   ADL Overall ADL's : Needs assistance/impaired Eating/Feeding: Set up;Bed level   Grooming: Wash/dry hands;Wash/dry face;Oral care;Set up;Bed level   Upper Body Bathing: Moderate assistance;Bed level;Sitting   Lower Body Bathing: Maximal assistance;Bed level   Upper Body Dressing : Maximal assistance;Bed level;Sitting   Lower Body Dressing: Total assistance;Bed level   Toilet Transfer: Total assistance Toilet Transfer Details (indicate cue type and reason): unable to safely attempt with +1 assist  Toileting- Clothing Manipulation and Hygiene: Total assistance;Bed level       Functional mobility during ADLs: Maximal assistance  Vision Baseline Vision/History: Wears glasses Wears Glasses: At all times Patient Visual Report: No change from baseline Additional Comments: vision to be assessed.  Not formally assessed due to time constraints this date      Perception Perception Perception Tested?: Yes Perception Deficits: Inattention/neglect Inattention/Neglect: Does not attend to left side of body Spatial deficits: Mild Lt inattention noted.  Pt demonstrates pusher syndrom    Praxis Praxis Praxis tested?:  Deficits Deficits: Organization    Pertinent Vitals/Pain Pain Assessment: No/denies pain     Hand Dominance Right   Extremity/Trunk Assessment Upper Extremity Assessment Upper Extremity Assessment: LUE deficits/detail LUE Deficits / Details: He demonstrates Lt UE movement in [place] stage 2, hand in stage 4 (mass grasp/release).  He is able to initiate shoulder flexion with mod facilitation  LUE Sensation: decreased light touch   Lower Extremity Assessment Lower Extremity Assessment: Defer to PT evaluation   Cervical / Trunk Assessment Cervical / Trunk Assessment: Other exceptions Cervical / Trunk Exceptions: Pt maintains trunk in flexed position with active lateral flexion of Rt trunk    Communication Communication Communication: No difficulties   Cognition Arousal/Alertness: Awake/alert Behavior During Therapy: WFL for tasks assessed/performed Overall Cognitive Status: Impaired/Different from baseline Area of Impairment: Attention;Following commands;Awareness;Problem solving                   Current Attention Level: Selective   Following Commands: Follows one step commands consistently;Follows multi-step commands inconsistently Safety/Judgement: Decreased awareness of deficits;Decreased awareness of safety Awareness: Intellectual Problem Solving: Difficulty sequencing;Requires verbal cues;Requires tactile cues     General Comments  VSS.  Wife present     Exercises     Shoulder Instructions      Home Living Family/patient expects to be discharged to:: Private residence Living Arrangements: Spouse/significant other Available Help at Discharge: Family;Friend(s) Type of Home: House Home Access: Stairs to enter [employer] of Steps: 6 Entrance Stairs-Rails: Can reach both Home Layout: Multi-level Alternate Level Stairs-Number of Steps: 15 Alternate Level Stairs-Rails: Right Bathroom Shower/Tub: Occupational psychologist: Handicapped  height Bathroom Accessibility: Yes   Home Equipment: None          Prior Functioning/Environment Level of Independence: Independent        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, driving         OT Problem List: Decreased strength;Decreased activity tolerance;Impaired balance (sitting and/or standing);Impaired vision/perception;Decreased coordination;Decreased cognition;Decreased safety awareness;Decreased knowledge of use of DME or AE;Impaired sensation;Impaired UE functional use      OT Treatment/Interventions: Self-care/ADL training;Neuromuscular education;DME and/or AE instruction;Therapeutic activities;Cognitive remediation/compensation;Visual/perceptual remediation/compensation;Patient/family education;Balance training    OT Goals(Current goals can be found in the care plan section) Acute Rehab OT Goals Patient Stated Goal: To get back to doing all my stuff  OT Goal Formulation: With patient/family Time For Goal Achievement: 03/28/18 Potential to Achieve Goals: Good ADL Goals Pt Will Perform Eating: with modified independence;sitting Pt Will Perform Grooming: with set-up;sitting Pt Will Perform Upper Body Bathing: with set-up;with supervision;sitting Pt Will Perform Lower Body Bathing: with mod assist;sit to/from stand Pt Will Perform Upper Body Dressing: with min assist;sitting Pt Will Perform Lower Body Dressing: with mod assist;sit to/from stand Pt Will Transfer to Toilet: with mod assist;stand pivot transfer;bedside commode Pt Will Perform Toileting - Clothing Manipulation and hygiene: with mod assist;sit to/from stand Additional ADL Goal #1: Pt will use Lt UE as an active assist during ADLs  OT Frequency: Min 2X/week   Barriers to D/C:  Co-evaluation              AM-PAC PT "6 Clicks" Daily Activity     Outcome Measure Help from another person eating meals?: A Little Help from another person taking care of personal grooming?: A  Little Help from another person toileting, which includes using toliet, bedpan, or urinal?: A Lot Help from another person bathing (including washing, rinsing, drying)?: A Lot Help from another person to put on and taking off regular upper body clothing?: A Lot Help from another person to put on and taking off regular lower body clothing?: Total 6 Click Score: 13   End of Session Nurse Communication: Mobility status  Activity Tolerance: Patient tolerated treatment well Patient left: in bed;with call bell/phone within reach;with nursing/sitter in room  OT Visit Diagnosis: Hemiplegia and hemiparesis Hemiplegia - Right/Left: Left Hemiplegia - dominant/non-dominant: Non-Dominant Hemiplegia - caused by: Cerebral infarction                Time: 0912-0949 OT Time Calculation (min): 37 min Charges:  OT General Charges $OT Visit: 1 Visit OT Evaluation $OT Eval High Complexity: 1 High OT Treatments $Neuromuscular Re-education: 8-22 mins  Lucille Passy, OTR/L Acute Rehabilitation Services Pager [phone] Office [phone]   Lucille Passy M 03/14/2018, 11:44 AM

## 2018-03-14 NOTE — Progress Notes (Signed)
Rehab Admissions Coordinator Note:  Per PT and OT recommendation, Patient was screened by Jhonnie Garner for appropriateness for an Inpatient Acute Rehab Consult.  At this time, we are recommending Inpatient Rehab consult. AC will contact MD regarding IP Rehab Consult Order.   Jhonnie Garner 03/14/2018, 6:10 PM  I can be reached at [phone].

## 2018-03-14 NOTE — Progress Notes (Addendum)
STROKE TEAM PROGRESS NOTE   SUBJECTIVE (INTERVAL HISTORY) His wife and RN are at the bedside. Pt neuro stable, still has right hemiparesis.  Yesterday had A. fib RVR, new onset, had 1 dose of metoprolol 5 mg IV followed by Cardizem drip.  This morning heart rate 80s, however, during neuro examination, his heart rate goes to 140s with BP down to 80s.  Give another dose of metoprolol IV 5 mg, and then titrating up Cardizem drip.  Will call cardiology for assistant.   OBJECTIVE Vitals:   03/14/18 0400 03/14/18 0500 03/14/18 0600 03/14/18 0700  BP: 113/72 105/66 113/74 111/85  Pulse: 78 98 (!) 113 85  Resp: (!) 23 (!) 21 (!) 23 (!) 23  Temp:      TempSrc:      SpO2: 94% 97% 94% 98%  Weight:      Height:        CBC:  Recent Labs  Lab 03/12/18 1119 03/13/18 0400 03/14/18 0237  WBC 6.1 7.5 9.8  NEUTROABS 3.1  --   --   HGB 13.8 14.5 14.5  HCT 43.5 44.8 45.5  MCV 86.8 86.5 87.5  PLT 243 258 371    Basic Metabolic Panel:  Recent Labs  Lab 03/13/18 0400 03/14/18 0237  NA 141 138  K 3.7 4.0  CL 104 105  CO2 25 23  GLUCOSE 133* 114*  BUN 11 18  CREATININE 0.83 1.08  CALCIUM 9.3 9.0    Lipid Panel:     Component Value Date/Time   CHOL 165 03/13/2018 0400   TRIG 153 (H) 03/13/2018 0400   HDL 40 (L) 03/13/2018 0400   CHOLHDL 4.1 03/13/2018 0400   VLDL 31 03/13/2018 0400   LDLCALC 94 03/13/2018 0400   HgbA1c:  Lab Results  Component Value Date   HGBA1C 6.4 (H) 03/13/2018   Urine Drug Screen:     Component Value Date/Time   LABOPIA NONE DETECTED 03/12/2018 1335   COCAINSCRNUR NONE DETECTED 03/12/2018 1335   LABBENZ NONE DETECTED 03/12/2018 1335   AMPHETMU NONE DETECTED 03/12/2018 1335   THCU NONE DETECTED 03/12/2018 1335   LABBARB NONE DETECTED 03/12/2018 1335    Alcohol Level     Component Value Date/Time   ETH <10 03/12/2018 1118    IMAGING   Ct Head Code Stroke Wo Contrast 03/12/2018 IMPRESSION:  4 cc acute hematoma in the right thalamus and  corona radiata, often hypertensive in this location.    Marcus Beasley IR Contrast  Result Date: 03/13/2018 CLINICAL DATA:  Intracranial hemorrhage, continued surveillance. LEFT hemiparesis and hemisensory deficit. EXAM: MRI HEAD WITHOUT AND WITH CONTRAST MRA HEAD WITHOUT CONTRAST TECHNIQUE: Multiplanar, multiecho pulse sequences of the brain and surrounding structures were obtained without and with intravenous contrast. Angiographic images of the head were obtained using MRA technique without contrast. CONTRAST:  Gadavist, 7 mL. COMPARISON:  CT head 03/12/2018 FINDINGS: MRI HEAD FINDINGS Brain: 24 x 19 x 23 mm RIGHT thalamic hemorrhage, is redemonstrated, approximate 5 mL volume, essentially unchanged from priors. Mild surrounding edema. No abnormality on diffusion-weighted imaging outside of the area of the bleed. Mild atrophy. Mild subcortical and periventricular T2 and FLAIR hyperintensities, likely chronic microvascular ischemic change. Post infusion, no concerning areas of abnormal enhancement. Vascular: Flow voids are maintained. No areas of acute or chronic hemorrhage are identified elsewhere on susceptibility weighted imaging. Skull and upper cervical spine: Unremarkable skull base. Mild cervical spondylosis. Sinuses/Orbits: No significant paranasal sinus disease. Other: None. MRA HEAD FINDINGS Dominant LEFT internal  carotid artery, but both are widely patent. No MCA proximal stenosis or branch occlusion. Hypoplastic RIGHT A1 ACA. Dominant LEFT A1 ACA feeds both anterior cerebral is a. basilar artery widely patent with vertebrals codominant. No PCA stenosis or occlusion. No cerebellar branch occlusion. IMPRESSION: Approximate 5 mL RIGHT thalamic hemorrhage, mild surrounding edema. No abnormal postcontrast enhancement or diffusion abnormality outside of the area of the bleed. Mild surrounding edema without midline shift. 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 Electronically Signed   By: Staci Righter M.D.   On: 03/13/2018 18:14   Marcus Beasley AY Contrast  Result Date: 03/13/2018 CLINICAL DATA:  Intracranial hemorrhage, continued surveillance. LEFT hemiparesis and hemisensory deficit. EXAM: MRI HEAD WITHOUT AND WITH CONTRAST MRA HEAD WITHOUT CONTRAST TECHNIQUE: Multiplanar, multiecho pulse sequences of the brain and surrounding structures were obtained without and with intravenous contrast. Angiographic images of the head were obtained using MRA technique without contrast. CONTRAST:  Gadavist, 7 mL. COMPARISON:  CT head 03/12/2018 FINDINGS: MRI HEAD FINDINGS Brain: 24 x 19 x 23 mm RIGHT thalamic hemorrhage, is redemonstrated, approximate 5 mL volume, essentially unchanged from priors. Mild surrounding edema. No abnormality on diffusion-weighted imaging outside of the area of the bleed. Mild atrophy. Mild subcortical and periventricular T2 and FLAIR hyperintensities, likely chronic microvascular ischemic change. Post infusion, no concerning areas of abnormal enhancement. Vascular: Flow voids are maintained. No areas of acute or chronic hemorrhage are identified elsewhere on susceptibility weighted imaging. Skull and upper cervical spine: Unremarkable skull base. Mild cervical spondylosis. Sinuses/Orbits: No significant paranasal sinus disease. Other: None. MRA HEAD FINDINGS Dominant LEFT internal carotid artery, but both are widely patent. No MCA proximal stenosis or branch occlusion. Hypoplastic RIGHT A1 ACA. Dominant LEFT A1 ACA feeds both anterior cerebral is a. basilar artery widely patent with vertebrals codominant. No PCA stenosis or occlusion. No cerebellar branch occlusion. IMPRESSION: Approximate 5 mL RIGHT thalamic hemorrhage, mild surrounding edema. No abnormal postcontrast enhancement or diffusion  abnormality outside of the area of the bleed. Mild surrounding edema without midline shift. 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 Electronically Signed   By: Staci Righter M.D.   On: 03/13/2018 18:14    Transthoracic Echocardiogram Left ventricle: The cavity size was normal. Wall thickness was   increased in a pattern of mild LVH. Systolic function was   vigorous. The estimated ejection fraction was in the range of 65%   to 70%. Wall motion was normal; there were no regional wall   motion abnormalities. - Aortic valve: There was trivial regurgitation.   Bilateral Carotid Dopplers - pending 00/00/00     PHYSICAL EXAM Temp:  [97.6 F (36.4 C)-99.1 F (37.3 C)] 98.3 F (36.8 C) (09/22 0000) Pulse Rate:  [35-113] 85 (09/22 0700) Resp:  [14-26] 23 (09/22 0700) BP: (91-139)/(61-99) 111/85 (09/22 0700) SpO2:  [86 %-98 %] 98 % (09/22 0700)  General - Well nourished, well developed, in no apparent distress.  Ophthalmologic - fundi not visualized due to noncooperation.  Cardiovascular - irregularly irregular heart rate and rhythm with RVR.  Mental Status -  Level of arousal and orientation to time, place, and person were intact. Language including expression, naming,  repetition, comprehension was assessed and found intact. Fund of Knowledge was assessed and was intact.  Cranial Nerves II - XII - II - Visual field intact OU. III, IV, VI - Extraocular movements intact. V - Facial sensation intact bilaterally. VII - mild left facial droop. VIII - Hearing & vestibular intact bilaterally. X - Palate elevates symmetrically. XI - Chin turning & shoulder shrug intact bilaterally. XII - Tongue protrusion intact.  Motor Strength - The patient's strength was normal in RUE and RLE, but  LUE 2/5 proximal, 3-/5 distal, LLE 3/5 proximal and distal and pronator drift was absent.  Bulk was normal and fasciculations were absent.   Motor Tone - Muscle tone was assessed at the neck and appendages and was normal.  Reflexes - The patient's reflexes were symmetrical in all extremities and he had no pathological reflexes.  Sensory - Light touch, temperature/pinprick were assessed and were decreased on the left, LUE 10% and LLE 50% comparing with right.    Coordination - The patient had normal movements in the right hand with no ataxia or dysmetria.  Tremor was absent.  Gait and Station - deferred.    ASSESSMENT/PLAN Marcus. Marcus Beasley is a 72 y.o. male with history of hyperlipidemia, hypertension, diabetes mellitus, and BPH presenting with facial numbness, slurred speech, leg weakness, and facial droop. He did not receive IV t-PA due to [place].   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  Resultant  Left hemiparesis, left hemianesthesia  CT head - 4 cc acute hematoma in the right thalamus and corona radiata  MRI head - right thalamic ICH, stable in size, no abnormal enhancement  MRA head negative for aneurysm or AVM  Carotid Doppler - pending  2D Echo EF 65-70%  LDL - 94  HgbA1c - 6.4  UDS neg  VTE prophylaxis - heparin subq  Diet - Heart healthy / carb modified with thin liquids.  aspirin 81 mg daily prior to admission, now on No antithrombotic  Patient counseled to be compliant with his antithrombotic medications  Ongoing aggressive stroke risk factor management  Therapy recommendations: CIR  Disposition:  Pending  Paroxysmal Afib with RVR  New onset  On metoprolol IV as needed  On Cardizem drip  Put on metoprolol 50 mg twice daily  Will have cardiology consultation  Hypertension  Stable off cleviprex . Systolic blood pressure goal < 160 mmHg . Home po meds - Ziac . Put on metoprolol, and on Cardizem drip . Long-term BP goal  normotensive  Hyperlipidemia  Lipid lowering medication PTA:  Lipitor 20 mg daily  LDL 94, goal < 70  Consider increasing Lipitor to 40 mg daily at time of discharge  Diabetes  HgbA1c 6.4, goal < 7.0  Controlled  On metformin  SSI  CBG monitoring  Other Stroke Risk Factors  Advanced age  ETOH use, advised to drink no more than 1 alcoholic beverage per day   Other Active Problems  Hospital day # 2  This patient is critically ill due to [place], hypertensive emergency, A. fib RVR and at significant risk of neurological worsening, death form hematoma expansion, heart failure, ischemic stroke, seizure, hypertensive encephalopathy. This patient's care requires constant monitoring of vital signs, hemodynamics, respiratory and cardiac monitoring, review of multiple databases, neurological assessment, discussion with family, other specialists and medical decision making of high complexity. I spent 40 minutes of neurocritical care time in the care of this patient. I had long discussion with wife and patient at bedside,  updated pt current condition, treatment plan and potential prognosis. They expressed understanding and appreciation.    Rosalin Hawking, MD PhD Stroke Neurology 03/14/2018 10:57 AM      To contact Stroke Continuity provider, please refer to [email or web address] After hours, contact General Neurology

## 2018-03-14 NOTE — Consult Note (Signed)
Cardiology Consultation:   Patient ID: Marcus Beasley MRN: [id number]; DOB: April 16, 1946  Admit date: 03/12/2018 Date of Consult: 03/14/2018  Primary Care Provider: Colon Branch, MD Primary Cardiologist: No primary care provider on file. New Primary Electrophysiologist:  None    Patient Profile:   Marcus Beasley is a 72 y.o. male with a hx of HTN who is being seen today for the evaluation of atrial fibrillation at the request of Dr. Erlinda Hong.  History of Present Illness:   Marcus Beasley is a 72 yo BM with history of HTN, DM,  and HLD who presented on 03/12/18 with facial droop, slurred speech and left leg weakness. 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 Admitted by Neurology. During admission patient developed paroxysmal AFib with RVR. Now on IV cardizem at 10 mg/hr. In and out of Afib with rates up to 136. Hemodynamically stable. Notes mild palpitations but otherwise has no dyspnea or chest pain. No prior cardiac history. Denies history of CAD, CHF, Afib, or murmur.   Past Medical History:  Diagnosis Date  . BPH (benign prostatic hyperplasia)    (-) Bx 2015  . Diabetes mellitus without complication ([place])   . Elevated PSA    Prostate Bx in 06/2013 was benign  . HTN (hypertension)   . Hyperlipidemia   . Macular degeneration, age related     Past Surgical History:  Procedure Laterality Date  . ABCESS DRAINAGE     abdomen- 26 day hospitalization 1968  . COLONOSCOPY  2016  . HERNIA REPAIR  summer '11   umbilical, dr Ninfa Linden   . POLYPECTOMY    . PROSTATE BIOPSY  06-2013 , 07-2017   (-), (-)     Home Medications:  Prior to Admission medications   Medication Sig Start Date End Date Taking? Authorizing Provider  aspirin EC 81 MG tablet Take 81 mg by mouth daily.   Yes [provider]  atorvastatin (LIPITOR) 20 MG tablet Take 1 tablet (20 mg total) by mouth daily. 09/18/17  Yes Paz, Alda Berthold, MD  bisoprolol-hydrochlorothiazide [facility]) 5-6.25 MG tablet Take 1 tablet  by mouth daily. 12/16/17  Yes Paz, Alda Berthold, MD  Camphor-Eucalyptus-Menthol (VICKS VAPORUB EX) Apply 1 application topically at bedtime as needed (congestion).   Yes [provider]  ibuprofen (ADVIL,MOTRIN) 200 MG tablet Take 400 mg by mouth every 6 (six) hours as needed for headache (pain).   Yes [provider]  meclizine (ANTIVERT) 25 MG tablet Take 1 tablet (25 mg total) by mouth as needed for dizziness. Patient taking differently: Take 25 mg by mouth daily as needed for dizziness.  10/07/17  Yes Colon Branch, MD  metFORMIN (GLUCOPHAGE) 850 MG tablet Take 1 tablet (850 mg total) by mouth 2 (two) times daily with a meal. 11/17/17  Yes Paz, Alda Berthold, MD  Multiple Vitamin (MULTIVITAMIN WITH MINERALS) TABS tablet Take 1 tablet by mouth daily. Centrum   Yes [provider]  Multiple Vitamins-Minerals (EYE VITAMINS PO) Take 1 tablet by mouth daily.   Yes [provider]  Polyvinyl Alcohol-Povidone (REFRESH OP) Place 1 drop into both eyes 2 (two) times daily as needed (dry eyes).   Yes [provider]  sildenafil (VIAGRA) 100 MG tablet Take 100 mg by mouth daily as needed for erectile dysfunction. Reported on 12/14/2015   Yes [provider]  Lancets [facility] ULTRASOFT) lancets Check blood sugar no more than twice daily. 12/14/15   Colon Branch, MD  [facility] VERIO test  strip CHECK BLOOD SUGAR NO MORE THAN TWICE DAILY 06/18/17   Colon Branch, MD    Inpatient Medications: Scheduled Meds: .  stroke: mapping our early stages of recovery book   Does not apply Once  . heparin injection (subcutaneous)  5,000 Units Subcutaneous Q8H  . insulin aspart  0-15 Units Subcutaneous TID WC  . insulin aspart  0-5 Units Subcutaneous QHS  . metFORMIN  850 mg Oral BID WC  . metoprolol tartrate      . metoprolol tartrate  50 mg Oral BID  . pantoprazole  40 mg Oral Daily  . senna-docusate  1 tablet Oral BID   Continuous Infusions: . clevidipine Stopped (03/13/18 0824)  .  diltiazem (CARDIZEM) infusion Stopped (03/14/18 1059)   PRN Meds: acetaminophen **OR** acetaminophen (TYLENOL) oral liquid 160 mg/5 mL **OR** acetaminophen  Allergies:   No Known Allergies  Social History:   Social History   Socioeconomic History  . Marital status: Married    Spouse name: Not on file  . Number of children: 3  . Years of education: 19  . Highest education level: Not on file  Occupational History  . 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 Financial resource strain: Not on file  . Food insecurity:    Worry: Not on file    Inability: Not on file  . Transportation needs:    Medical: Not on file    Non-medical: Not on file  Tobacco Use  . Smoking status: Never Smoker  . Smokeless tobacco: Never Used  Substance and Sexual Activity  . Alcohol use: Yes    Comment: occ. beer  . Drug use: No  . Sexual activity: Yes    Partners: Female  Lifestyle  . Physical activity:    Days per week: Not on file    Minutes per session: Not on file  . Stress: Not on file  Relationships  . Social connections:    Talks on phone: Not on file    Gets together: Not on file    Attends religious service: Not on file    Active member of club or organization: Not on file    Attends meetings of clubs or organizations: Not on file    Relationship status: Not on file  . Intimate partner violence:    Fear of current or ex partner: Not on file    Emotionally abused: Not on file    Physically abused: Not on file    Forced sexual activity: Not on file  Other Topics Concern  . 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 2 dtrs , 1 son - homicide. 5 grandchildren.      Family History:    Family History  Problem Relation Age of Onset  . Leukemia Mother   . Heart attack Father        MI age 71  . Heart disease Sister        age 74  . Cancer - Other Sister        type  . Heart disease  Brother        age 40  . Kidney disease Brother        HD  . Diabetes Maternal Grandmother   . Prostate cancer Neg Hx   . Colon cancer Neg Hx   . Esophageal cancer Neg Hx   . Rectal cancer Neg Hx   .  Stomach cancer Neg Hx      ROS:  Please see the history of present illness.   All other ROS reviewed and negative.     Physical Exam/Data:   Vitals:   03/14/18 0800 03/14/18 0900 03/14/18 1000 03/14/18 1100  BP: 135/82 130/88 92/60 121/79  Pulse: 72 70 84 (!) 33  Resp: (!) 22 16 20  (!) 21  Temp: 98.1 F (36.7 C)     TempSrc: Axillary     SpO2: 96% 98% 95% 95%  Weight:      Height:        Intake/Output Summary (Last 24 hours) at 03/14/2018 1139 Last data filed at 03/14/2018 1100 Gross per 24 hour  Intake 344.64 ml  Output 1250 ml  Net -905.36 ml   Filed Weights   03/12/18 1100 03/12/18 1210  Weight: 85.8 kg 84.7 kg   Body mass index is 26.04 kg/m.  General:  Well nourished, well developed, in no acute distress HEENT: normal Lymph: no adenopathy Neck: no JVD Endocrine:  No thryomegaly Vascular: No carotid bruits; FA pulses 2+ bilaterally without bruits  Cardiac:  normal S1, S2; RRR; no murmur  Lungs:  clear to auscultation bilaterally, no wheezing, rhonchi or rales  Abd: soft, nontender, no hepatomegaly  Ext: no edema Musculoskeletal:  No deformities, BUE and BLE strength normal and equal Skin: warm and dry  Neuro:  CNs 2-12 intact, left leg weakness.  Psych:  Normal affect   EKG:  The EKG was personally reviewed and demonstrates:  Afib with RVR. LVH Telemetry:  Telemetry was personally reviewed and demonstrates:  In and out of afib and NSR.   Relevant CV Studies: Echo: Study Conclusions  - Left ventricle: The cavity size was normal. Wall thickness was   increased in a pattern of mild LVH. Systolic function was   vigorous. The estimated ejection fraction was in the range of 65%   to 70%. Wall motion was normal; there were no regional wall   motion  abnormalities. - Aortic valve: There was trivial regurgitation.  Laboratory Data:  Chemistry Recent Labs  Lab 03/12/18 1119 03/13/18 0400 03/14/18 0237  NA 140 141 138  K 4.1 3.7 4.0  CL 105 104 105  CO2 22 25 23   GLUCOSE 98 133* 114*  BUN 13 11 18   CREATININE 0.85 0.83 1.08  CALCIUM 9.2 9.3 9.0  GFRNONAA >60 >60 >60  GFRAA >60 >60 >60  ANIONGAP 13 12 10     Recent Labs  Lab 03/12/18 1119  PROT 6.4*  ALBUMIN 3.8  AST 25  ALT 18  ALKPHOS 67  BILITOT 0.8   Hematology Recent Labs  Lab 03/12/18 1119 03/13/18 0400 03/14/18 0237  WBC 6.1 7.5 9.8  RBC 5.01 5.18 5.20  HGB 13.8 14.5 14.5  HCT 43.5 44.8 45.5  MCV 86.8 86.5 87.5  MCH 27.5 28.0 27.9  MCHC 31.7 32.4 31.9  RDW 14.0 14.1 14.4  PLT 243 258 251   Cardiac EnzymesNo results for input(s): TROPONINI in the last 168 hours.  Recent Labs  Lab 03/12/18 1116  TROPIPOC 0.00    BNPNo results for input(s): BNP, PROBNP in the last 168 hours.  DDimer No results for input(s): DDIMER in the last 168 hours.  Radiology/Studies:  Mr [name] XH Contrast  Result Date: 03/13/2018 CLINICAL DATA:  Intracranial hemorrhage, continued surveillance. LEFT hemiparesis and hemisensory deficit. EXAM: MRI HEAD WITHOUT AND WITH CONTRAST MRA HEAD WITHOUT CONTRAST TECHNIQUE: Multiplanar, multiecho pulse sequences of the brain and surrounding  structures were obtained without and with intravenous contrast. Angiographic images of the head were obtained using MRA technique without contrast. CONTRAST:  Gadavist, 7 mL. COMPARISON:  CT head 03/12/2018 FINDINGS: MRI HEAD FINDINGS Brain: 24 x 19 x 23 mm RIGHT thalamic hemorrhage, is redemonstrated, approximate 5 mL volume, essentially unchanged from priors. Mild surrounding edema. No abnormality on diffusion-weighted imaging outside of the area of the bleed. Mild atrophy. Mild subcortical and periventricular T2 and FLAIR hyperintensities, likely chronic microvascular ischemic change. Post infusion,  no concerning areas of abnormal enhancement. Vascular: Flow voids are maintained. No areas of acute or chronic hemorrhage are identified elsewhere on susceptibility weighted imaging. Skull and upper cervical spine: Unremarkable skull base. Mild cervical spondylosis. Sinuses/Orbits: No significant paranasal sinus disease. Other: None. MRA HEAD FINDINGS Dominant LEFT internal carotid artery, but both are widely patent. No MCA proximal stenosis or branch occlusion. Hypoplastic RIGHT A1 ACA. Dominant LEFT A1 ACA feeds both anterior cerebral is a. basilar artery widely patent with vertebrals codominant. No PCA stenosis or occlusion. No cerebellar branch occlusion. IMPRESSION: Approximate 5 mL RIGHT thalamic hemorrhage, mild surrounding edema. No abnormal postcontrast enhancement or diffusion abnormality outside of the area of the bleed. Mild surrounding edema without midline shift. 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 Electronically Signed   By: Staci Righter M.D.   On: 03/13/2018 18:14   Mr [name] XF Contrast  Result Date: 03/13/2018 CLINICAL DATA:  Intracranial hemorrhage, continued surveillance. LEFT hemiparesis and hemisensory deficit. EXAM: MRI HEAD WITHOUT AND WITH CONTRAST MRA HEAD WITHOUT CONTRAST TECHNIQUE: Multiplanar, multiecho pulse sequences of the brain and surrounding structures were obtained without and with intravenous contrast. Angiographic images of the head were obtained using MRA technique without contrast. CONTRAST:  Gadavist, 7 mL. COMPARISON:  CT head 03/12/2018 FINDINGS: MRI HEAD FINDINGS Brain: 24 x 19 x 23 mm RIGHT thalamic hemorrhage, is redemonstrated, approximate 5 mL volume, essentially unchanged from priors. Mild surrounding edema. No abnormality on diffusion-weighted imaging  outside of the area of the bleed. Mild atrophy. Mild subcortical and periventricular T2 and FLAIR hyperintensities, likely chronic microvascular ischemic change. Post infusion, no concerning areas of abnormal enhancement. Vascular: Flow voids are maintained. No areas of acute or chronic hemorrhage are identified elsewhere on susceptibility weighted imaging. Skull and upper cervical spine: Unremarkable skull base. Mild cervical spondylosis. Sinuses/Orbits: No significant paranasal sinus disease. Other: None. MRA HEAD FINDINGS Dominant LEFT internal carotid artery, but both are widely patent. No MCA proximal stenosis or branch occlusion. Hypoplastic RIGHT A1 ACA. Dominant LEFT A1 ACA feeds both anterior cerebral is a. basilar artery widely patent with vertebrals codominant. No PCA stenosis or occlusion. No cerebellar branch occlusion. IMPRESSION: Approximate 5 mL RIGHT thalamic hemorrhage, mild surrounding edema. No abnormal postcontrast enhancement or diffusion abnormality outside of the area of the bleed. Mild surrounding edema without midline shift. 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 Electronically Signed   By: Staci Righter M.D.   On: 03/13/2018 18:14   Ct Head Code Stroke Wo Contrast  Result Date: 03/12/2018 CLINICAL DATA:  Code stroke. Left-sided facial droop. Left leg weakness. EXAM: CT HEAD WITHOUT CONTRAST TECHNIQUE: Contiguous axial images were obtained from the  base of the skull through the vertex without intravenous contrast. COMPARISON:  None. FINDINGS: Brain: 1.5 x 2.8 x 1.9 cm high-density hematoma in the right thalamus and adjacent corona radiata. Thin rim of surrounding edema. No indication of underlying infarct or mass. No hydrocephalus or extra-axial collection. No ventricular or  subarachnoid extension. Vascular: Mild arterial calcification. Skull: No acute finding. Sinuses/Orbits: Negative Other: Critical Value/emergent results were called by telephone at the time of interpretation on 03/12/2018 at 11:23 am to Dr. Rory Percy , who verbally acknowledged these results. ASPECTS [facility] Stroke Program Early CT Score) Not scored in the setting of acute hemorrhage. IMPRESSION: 4 cc acute hematoma in the right thalamus and corona radiata, often hypertensive in this location. Electronically Signed   By: Monte Fantasia M.D.   On: 03/12/2018 11:27    Assessment and Plan:   1. Paroxysmal Afib with RVR. Patient minimally symptomatic and BP stable. Will continue IV cardizem for rate control. Add oral metoprolol. I would not start AAD therapy at this point. [place] Vasc score of 5 now but not a candidate for anticoagulation due to acute hemorraghic stroke. Will need to consider this in the future.  2. Acute hemorrhagic right thalamic CVA 3. HTN 4. DM type 2 5. HLD.       For questions or updates, please contact [place] Please consult [email or web address] for contact info under     Signed, Marc Leichter [place], MD  03/14/2018 11:39 AM

## 2018-03-14 NOTE — Evaluation (Addendum)
Speech Language Pathology Evaluation Patient Details Name: Marcus Beasley MRN: [id number] DOB: [birth year] Today's Date: 03/14/2018 Time: 3329-5188 SLP Time Calculation (min) (ACUTE ONLY): 32 min  Problem List:  Patient Active Problem List   Diagnosis Date Noted  . Paroxysmal A-fib ([place])   . Hemorrhagic stroke ([place])   . ICH (intracerebral hemorrhage) ([place]) 03/12/2018  . Prostate cancer ([place]) 01/29/2017  . 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 Diabetes ([place]) 12/14/2015  . PCP NOTES >>>>>>>>>>>>>>>>>>>>>>>>>>>>>>. 08/06/2015  . Dizziness and giddiness 01/29/2015  . Umbilical hernia 41/66/0630  . Elevated PSA, less than 10 ng/ml 04/19/2012  . Annual physical exam 01/24/2011  . Hyperlipidemia 01/03/2010  . Essential hypertension 09/20/2007   Past Medical History:  Past Medical History:  Diagnosis Date  . BPH (benign prostatic hyperplasia)    (-) Bx 2015  . Diabetes mellitus without complication ([place])   . Elevated PSA    Prostate Bx in 06/2013 was benign  . HTN (hypertension)   . Hyperlipidemia   . Macular degeneration, age related    Past Surgical History:  Past Surgical History:  Procedure Laterality Date  . ABCESS DRAINAGE     abdomen- 26 day hospitalization 1968  . COLONOSCOPY  2016  . HERNIA REPAIR  summer '11   umbilical, dr Ninfa Linden   . POLYPECTOMY    . PROSTATE BIOPSY  06-2013 , 07-2017   (-), (-)   HPI:  72 y.o. male admitted with facial numbness, slurred speech, leg weakness, and facial droop with right thalamic and corona radiata ICH. PMH: macular degeneration, HLD, HTN, DM and BPH.   Assessment / Plan / Recommendation Clinical Impression  Pt's language, motor speech and intelligibilty were within functional limits however mild-moderate cognitive impairments detected. 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 The MOCA was administered with pt scoring  18/30 exhibiting most difficulty with digit recall subtest of attention, delayed recall (3/5 independently, 1/5 semantic cue, 1/5 incorrect with choice). Visuospatial deficit with clock drawing evident as well as inattention to left side of environment but not during reading/visualization on paper. He was unable to perform simple mental calculations and stated always had difficulty with math however wife stated pt would have done better prior to stroke. Pt would benefit from continued ST for return to complete independence with previous ADL's.      SLP Assessment  SLP Recommendation/Assessment: Patient needs continued Speech Lanaguage Pathology Services SLP Visit Diagnosis: Cognitive communication deficit (R41.841)    Follow Up Recommendations  Inpatient Rehab    Frequency and Duration min 2x/week  2 weeks      SLP Evaluation Cognition  Overall Cognitive Status: Impaired/Different from baseline Arousal/Alertness: Awake/alert Orientation Level: Oriented X4 Attention: Sustained Sustained Attention: Appears intact Memory: Impaired Memory Impairment: Storage deficit;Retrieval deficit Awareness: Impaired Awareness Impairment: Anticipatory impairment Problem Solving: (suspect difficulty with complex) Safety/Judgment: Appears intact       Comprehension  Auditory Comprehension Overall Auditory Comprehension: Appears within functional limits for tasks assessed Visual Recognition/Discrimination Discrimination: Not tested Reading Comprehension Reading Status: (TBA)    Expression Expression Primary Mode of Expression: Verbal Verbal Expression Overall Verbal Expression: Appears within functional limits for tasks assessed Initiation: No impairment Level of Generative/Spontaneous Verbalization: Conversation Repetition: (functional) Naming: Not tested Pragmatics: No impairment Written Expression Dominant Hand: Right Written Expression: Not tested(TBA)   Oral / Motor  Motor  Speech Overall Motor Speech: Appears within functional limits for tasks assessed  Respiration: Within functional limits Phonation: Normal Resonance: Within functional limits Articulation: Within functional limitis Intelligibility: Intelligible Motor Planning: Witnin functional limits   GO                    Houston Siren 03/14/2018, 6:25 PM  Orbie Pyo Colvin Caroli.Ed Risk analyst [phone] Office [phone]

## 2018-03-14 NOTE — Progress Notes (Signed)
Carotid artery duplex has been completed. 1-39% ICA stenosis bilaterally.  03/14/18 10:40 AM Marcus Beasley RVT

## 2018-03-15 DIAGNOSIS — I10 Essential (primary) hypertension: Secondary | ICD-10-CM

## 2018-03-15 DIAGNOSIS — I480 Paroxysmal atrial fibrillation: Secondary | ICD-10-CM

## 2018-03-15 DIAGNOSIS — E119 Type 2 diabetes mellitus without complications: Secondary | ICD-10-CM

## 2018-03-15 DIAGNOSIS — E785 Hyperlipidemia, unspecified: Secondary | ICD-10-CM

## 2018-03-15 DIAGNOSIS — I619 Nontraumatic intracerebral hemorrhage, unspecified: Secondary | ICD-10-CM

## 2018-03-15 LAB — GLUCOSE, CAPILLARY
Glucose-Capillary: 125 mg/dL — ABNORMAL HIGH (ref 70–99)
Glucose-Capillary: 111 mg/dL — ABNORMAL HIGH (ref 70–99)
Glucose-Capillary: 92 mg/dL (ref 70–99)
Glucose-Capillary: 100 mg/dL — ABNORMAL HIGH (ref 70–99)

## 2018-03-15 LAB — CBC
HCT: 51.4 % (ref 39.0–52.0)
Hemoglobin: 15.5 g/dL (ref 13.0–17.0)
MCH: 28 pg (ref 26.0–34.0)
MCHC: 30.2 g/dL (ref 30.0–36.0)
MCV: 92.9 fL (ref 78.0–100.0)
Platelets: 267 10*3/uL (ref 150–400)
RBC: 5.53 MIL/uL (ref 4.22–5.81)
RDW: 14.3 % (ref 11.5–15.5)
WBC: 8.7 10*3/uL (ref 4.0–10.5)

## 2018-03-15 LAB — BASIC METABOLIC PANEL
Anion gap: 13 (ref 5–15)
BUN: 18 mg/dL (ref 8–23)
CO2: 20 mmol/L — ABNORMAL LOW (ref 22–32)
Calcium: 9.2 mg/dL (ref 8.9–10.3)
Chloride: 104 mmol/L (ref 98–111)
Creatinine, Ser: 0.95 mg/dL (ref 0.61–1.24)
GFR calc Af Amer: >60 mL/min (ref >60)
GFR calc non Af Amer: >60 mL/min (ref >60)
Glucose, Bld: 116 mg/dL — ABNORMAL HIGH (ref 70–99)
Potassium: 4.4 mmol/L (ref 3.5–5.1)
Sodium: 137 mmol/L (ref 135–145)

## 2018-03-15 MED ORDER — DILTIAZEM HCL ER COATED BEADS 180 MG PO CP24
180.0000 mg | ORAL_CAPSULE | Freq: Every day | ORAL | Status: DC
  Administered 2018-03-15 – 2018-03-17 (×3): 180 mg via ORAL
  Filled 2018-03-15 (×3): qty 1

## 2018-03-15 NOTE — Progress Notes (Addendum)
Progress Note  Patient Name: Marcus Beasley Date of Encounter: 03/15/2018  Primary Cardiologist: New to [facility]-Dr. Martinique   Subjective   Pt feeling well today. Denies chest pain or palpitations. In NSR now.   Inpatient Medications    Scheduled Meds: .  stroke: mapping our early stages of recovery book   Does not apply Once  . heparin injection (subcutaneous)  5,000 Units Subcutaneous Q8H  . insulin aspart  0-15 Units Subcutaneous TID WC  . insulin aspart  0-5 Units Subcutaneous QHS  . metFORMIN  850 mg Oral BID WC  . metoprolol tartrate  50 mg Oral BID  . pantoprazole  40 mg Oral Daily  . senna-docusate  1 tablet Oral BID   Continuous Infusions: . clevidipine Stopped (03/13/18 0824)  . diltiazem (CARDIZEM) infusion 7.5 mg/hr (03/15/18 1100)   PRN Meds: acetaminophen **OR** acetaminophen (TYLENOL) oral liquid 160 mg/5 mL **OR** acetaminophen   Vital Signs    Vitals:   03/15/18 0800 03/15/18 0900 03/15/18 1000 03/15/18 1100  BP: (!) 149/102 133/78 (!) 133/96   Pulse: 63 73 83 72  Resp: 18 19 17 18   Temp: (!) 97.5 F (36.4 C)     TempSrc:      SpO2: 93% 93% 93% 95%  Weight:      Height:        Intake/Output Summary (Last 24 hours) at 03/15/2018 1108 Last data filed at 03/15/2018 1100 Gross per 24 hour  Intake 1144.29 ml  Output 1320 ml  Net -175.71 ml   Filed Weights   03/12/18 1100 03/12/18 1210  Weight: 85.8 kg 84.7 kg    Physical Exam   General: Elderly,  NAD Skin: Warm, dry, intact  Head: Normocephalic, atraumatic, clear, moist mucus membranes. Neck: Negative for carotid bruits. No JVD Lungs:Clear to ausculation bilaterally. No wheezes, rales, or rhonchi. Breathing is unlabored. Cardiovascular: RRR with S1 S2. No murmurs, rubs, gallops, or LV heave appreciated. Abdomen: Soft, non-tender, non-distended with normoactive bowel sounds. No obvious abdominal masses. MSK: Strength and tone decreased on left upper and lower extremity. Extremities: No  edema. No clubbing or cyanosis. DP/PT pulses 2+ bilaterally Neuro: Alert and oriented. No focal deficits. No facial asymmetry. MAE spontaneously. Psych: Responds to questions appropriately with normal affect.    Labs    Chemistry Recent Labs  Lab 03/12/18 1119 03/13/18 0400 03/14/18 0237 03/15/18 0222  NA 140 141 138 137  K 4.1 3.7 4.0 4.4  CL 105 104 105 104  CO2 22 25 23  20*  GLUCOSE 98 133* 114* 116*  BUN 13 11 18 18   CREATININE 2.63 0.83 1.08 0.95  CALCIUM 9.2 9.3 9.0 9.2  PROT 6.4*  --   --   --   ALBUMIN 3.8  --   --   --   AST 25  --   --   --   ALT 18  --   --   --   ALKPHOS 67  --   --   --   BILITOT 0.8  --   --   --   GFRNONAA >60 >60 >60 >60  GFRAA >60 >60 >60 >60  ANIONGAP 13 12 10 13      Hematology Recent Labs  Lab 03/13/18 0400 03/14/18 0237 03/15/18 0222  WBC 7.5 9.8 8.7  RBC 5.18 5.20 5.53  HGB 14.5 14.5 15.5  HCT 44.8 45.5 51.4  MCV 86.5 87.5 92.9  MCH 28.0 27.9 28.0  MCHC 32.4 31.9 30.2  RDW 14.1 14.4 14.3  PLT 258 251 267    Cardiac EnzymesNo results for input(s): TROPONINI in the last 168 hours.  Recent Labs  Lab 03/12/18 1116  TROPIPOC 0.00    BNPNo results for input(s): BNP, PROBNP in the last 168 hours.   DDimer No results for input(s): DDIMER in the last 168 hours.   Radiology    Mr [name] Head Wo Contrast  Result Date: 03/13/2018 CLINICAL DATA:  Intracranial hemorrhage, continued surveillance. LEFT hemiparesis and hemisensory deficit. EXAM: MRI HEAD WITHOUT AND WITH CONTRAST MRA HEAD WITHOUT CONTRAST TECHNIQUE: Multiplanar, multiecho pulse sequences of the brain and surrounding structures were obtained without and with intravenous contrast. Angiographic images of the head were obtained using MRA technique without contrast. CONTRAST:  Gadavist, 7 mL. COMPARISON:  CT head 03/12/2018 FINDINGS: MRI HEAD FINDINGS Brain: 24 x 19 x 23 mm RIGHT thalamic hemorrhage, is redemonstrated, approximate 5 mL volume, essentially unchanged from  priors. Mild surrounding edema. No abnormality on diffusion-weighted imaging outside of the area of the bleed. Mild atrophy. Mild subcortical and periventricular T2 and FLAIR hyperintensities, likely chronic microvascular ischemic change. Post infusion, no concerning areas of abnormal enhancement. Vascular: Flow voids are maintained. No areas of acute or chronic hemorrhage are identified elsewhere on susceptibility weighted imaging. Skull and upper cervical spine: Unremarkable skull base. Mild cervical spondylosis. Sinuses/Orbits: No significant paranasal sinus disease. Other: None. MRA HEAD FINDINGS Dominant LEFT internal carotid artery, but both are widely patent. No MCA proximal stenosis or branch occlusion. Hypoplastic RIGHT A1 ACA. Dominant LEFT A1 ACA feeds both anterior cerebral is a. basilar artery widely patent with vertebrals codominant. No PCA stenosis or occlusion. No cerebellar branch occlusion. IMPRESSION: Approximate 5 mL RIGHT thalamic hemorrhage, mild surrounding edema. No abnormal postcontrast enhancement or diffusion abnormality outside of the area of the bleed. Mild surrounding edema without midline shift. 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 Electronically Signed   By: Staci Righter M.D.   On: 03/13/2018 18:14   Mr [name] PN Contrast  Result Date: 03/13/2018 CLINICAL DATA:  Intracranial hemorrhage, continued surveillance. LEFT hemiparesis and hemisensory deficit. EXAM: MRI HEAD WITHOUT AND WITH CONTRAST MRA HEAD WITHOUT CONTRAST TECHNIQUE: Multiplanar, multiecho pulse sequences of the brain and surrounding structures were obtained without and with intravenous contrast. Angiographic images of the head were obtained using MRA technique without contrast. CONTRAST:  Gadavist, 7 mL.  COMPARISON:  CT head 03/12/2018 FINDINGS: MRI HEAD FINDINGS Brain: 24 x 19 x 23 mm RIGHT thalamic hemorrhage, is redemonstrated, approximate 5 mL volume, essentially unchanged from priors. Mild surrounding edema. No abnormality on diffusion-weighted imaging outside of the area of the bleed. Mild atrophy. Mild subcortical and periventricular T2 and FLAIR hyperintensities, likely chronic microvascular ischemic change. Post infusion, no concerning areas of abnormal enhancement. Vascular: Flow voids are maintained. No areas of acute or chronic hemorrhage are identified elsewhere on susceptibility weighted imaging. Skull and upper cervical spine: Unremarkable skull base. Mild cervical spondylosis. Sinuses/Orbits: No significant paranasal sinus disease. Other: None. MRA HEAD FINDINGS Dominant LEFT internal carotid artery, but both are widely patent. No MCA proximal stenosis or branch occlusion. Hypoplastic RIGHT A1 ACA. Dominant LEFT A1 ACA feeds both anterior cerebral is a. basilar artery widely patent with vertebrals codominant. No PCA stenosis or occlusion. No cerebellar branch occlusion. IMPRESSION: Approximate 5 mL RIGHT thalamic hemorrhage,  mild surrounding edema. No abnormal postcontrast enhancement or diffusion abnormality outside of the area of the bleed. Mild surrounding edema without midline shift. 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 Electronically Signed   By: Staci Righter M.D.   On: 03/13/2018 18:14   Telemetry    03/15/18 NSR HR 70's - Personally Reviewed  ECG    No new tracing as of 03/15/18 - Personally Reviewed  Cardiac Studies   Echocardiogram 03/13/18: Study Conclusions  - Left ventricle: The cavity size was normal. Wall thickness was   increased in a pattern of mild LVH. Systolic  function was   vigorous. The estimated ejection fraction was in the range of 65%   to 70%. Wall motion was normal; there were no regional wall   motion abnormalities. - Aortic valve: There was trivial regurgitation.  Patient Profile     72 y.o. male a hx of HTN, DM and HLD who presented on 03/12/18 with facial droop, slurred speech and left leg weakness found to have hemorrhagic CVA involving the right thalamus. Admitted by Neurology. During admission patient developed paroxysmal AFib with RVR who is being followed by Cardiology for the evaluation of atrial fibrillation at the request of Dr. Erlinda Hong.  Assessment & Plan    1. Paroxysmal atrial fibrillation: -Pt admitted with acute hemorraghic CVA with residual hemiparesis found to be in AF with RVR.  -Pt started on diltiazem gtt, now in NSR with rate control -HR in the 70-80's  -Continue metoprolol 50mg  twice daily  -Consider transitioning to PO Diltiazem 180mg  daily  -CHA2DS2VASc =5 however not anticoagulation candidate given recent hemorrhagic CVA  -Will focus on rate controlling efforts for now given recent stroke    2. Acute hemorrhagic right thalamic CVA: -Continues to have right hemiparesis  -Able to tolerate PO meds/meals -Per neurology, primary team   3. HTN: -Stable, 133/96>133/78>149/102>132/96 -Continue metoprolol 50mg  twice daily, diltiazem gtt    4. DM2: -Stable, last HbA1c 03/13/18, 6.4 -SSI for glucose control while inpatient status  5. HLD: -Stable, LDL 94 on 03/13/18 -Goal <70 -Continue statin   Signed, Kathyrn Drown NP-C [facility] Pager: [phone] 03/15/2018, 11:08 AM     For questions or updates, please contact   Please consult [email or web address] for contact info under Cardiology/STEMI.  Personally seen and examined. Agree with above.  72 year old male with hemorrhagic CVA right thalamus with paroxysmal atrial fibrillation  No chest pain, no shortness of breath.  Does not feel his atrial fibrillation  when he had it.  GEN: Well nourished, well developed, in no acute distress, sitting in chair family in room  HEENT: normal  Neck: no JVD, carotid bruits, or masses Cardiac: RRR; no murmurs, rubs, or gallops,no edema  Respiratory:  clear to auscultation bilaterally, normal work of breathing GI: soft, nontender, nondistended, + BS MS: no deformity or atrophy  Skin: warm and dry, no rash Neuro:  Alert and Oriented x 3, hemiparesis noted  psych: euthymic mood, full affect, facial droop noted  Telemetry: Sinus rhythm, no further atrial fibrillation personally reviewed  Echocardiogram: EF 70%  Assessment and plan:  Paroxysmal atrial fibrillation - Currently sinus rhythm.  Unable to anticoagulate because of acute hemorrhagic stroke.  Continue with metoprolol p.o. and would suggest transitioning him over to p.o. diltiazem extended release 180 mg daily.  Acute hemorrhagic thalamic CVA - Unable to  anticoagulate  Diabetes with hypertension - Medications reviewed.  Okay with transitioning diltiazem drip over to p.o. Diltiazem.  [facility] will sign off.   Medication Recommendations: Add Cardizem CD 180 mg once a day to metoprolol Other recommendations (labs, testing, etc): None Follow up as an outpatient: 2 months with cardiology, APP or Dr. Martinique  Beyounce Dickens, MD

## 2018-03-15 NOTE — Consult Note (Signed)
Physical Medicine and Rehabilitation Consult Reason for Consult: Left-sided weakness and slurred speech Referring Physician: Dr.Xu   HPI: Marcus Beasley is a 72 y.o. right-handed male with history of diabetes mellitus hypertension, hyperlipidemia.  Per chart review, patient, and family, patient lives with spouse.  Independent prior to admission and retired.  Multilevel home with 6 steps to entry.  Wife can assist as needed.  2 daughters in the area work.  Presented 03/12/2018 with left side weakness and slurred speech with facial numbness.Marland Kitchen  MRI reviewed, showing right thalamic hemorrhage. Per report, 24 x 19 x 23 mm right thalamic hemorrhage.  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  Echocardiogram with ejection fraction of 70% no wall motion abnormalities.  During admission patient developed PAF with RVR and received IV Cardizem.  Cardiology services consulted and currently maintained with rate controlled on metoprolol as well as Cardizem.  Subcutaneous heparin later initiated for DVT prophylaxis 03/14/2018.  Tolerating a regular diet.  Therapy evaluations completed with recommendations of physical medicine rehab consult.  Review of Systems  Constitutional: Negative for chills and fever.  HENT: Negative for hearing loss.   Eyes: Negative for blurred vision and double vision.  Respiratory: Negative for cough and shortness of breath.   Cardiovascular: Negative for chest pain, palpitations and leg swelling.  Gastrointestinal: Positive for constipation. Negative for nausea and vomiting.  Genitourinary: Positive for urgency. Negative for dysuria, flank pain and hematuria.  Musculoskeletal: Positive for myalgias.  Skin: Negative for rash.  Neurological: Positive for dizziness, speech change, focal weakness and headaches. Negative for sensory change.  All other systems reviewed and are negative.  Past Medical History:  Diagnosis  Date  . BPH (benign prostatic hyperplasia)    (-) Bx 2015  . Diabetes mellitus without complication ([place])   . Elevated PSA    Prostate Bx in 06/2013 was benign  . HTN (hypertension)   . Hyperlipidemia   . Macular degeneration, age related    Past Surgical History:  Procedure Laterality Date  . ABCESS DRAINAGE     abdomen- 26 day hospitalization 1968  . COLONOSCOPY  2016  . HERNIA REPAIR  summer '11   umbilical, dr Ninfa Linden   . POLYPECTOMY    . PROSTATE BIOPSY  06-2013 , 07-2017   (-), (-)   Family History  Problem Relation Age of Onset  . Leukemia Mother   . Heart attack Father        MI age 78  . Heart disease Sister        age 70  . Cancer - Other Sister        type  . Heart disease Brother        age 64  . Kidney disease Brother        HD  . Diabetes Maternal Grandmother   . Prostate cancer Neg Hx   . Colon cancer Neg Hx   . Esophageal cancer Neg Hx   . Rectal cancer Neg Hx   . Stomach cancer Neg Hx    Social History:  reports that he has never smoked. He has never used smokeless tobacco. He reports that he drinks alcohol. He reports that he does not use drugs. Allergies: No Known Allergies Facility-Administered Medications Prior to Admission  Medication Dose Route Frequency Provider Last Rate Last Dose  . 0.9 %  sodium chloride infusion  500 mL Intravenous Once Pyrtle, Lajuan Lines, MD  Medications Prior to Admission  Medication Sig Dispense Refill  . aspirin EC 81 MG tablet Take 81 mg by mouth daily.    Marland Kitchen atorvastatin (LIPITOR) 20 MG tablet Take 1 tablet (20 mg total) by mouth daily. 90 tablet 1  . bisoprolol-hydrochlorothiazide (ZIAC) 5-6.25 MG tablet Take 1 tablet by mouth daily. 90 tablet 1  . Camphor-Eucalyptus-Menthol (VICKS VAPORUB EX) Apply 1 application topically at bedtime as needed (congestion).    Marland Kitchen ibuprofen (ADVIL,MOTRIN) 200 MG tablet Take 400 mg by mouth every 6 (six) hours as needed for headache (pain).    . meclizine (ANTIVERT) 25 MG tablet Take  1 tablet (25 mg total) by mouth as needed for dizziness. (Patient taking differently: Take 25 mg by mouth daily as needed for dizziness. ) 30 tablet 1  . metFORMIN (GLUCOPHAGE) 850 MG tablet Take 1 tablet (850 mg total) by mouth 2 (two) times daily with a meal. 180 tablet 1  . Multiple Vitamin (MULTIVITAMIN WITH MINERALS) TABS tablet Take 1 tablet by mouth daily. Centrum    . Multiple Vitamins-Minerals (EYE VITAMINS PO) Take 1 tablet by mouth daily.    . Polyvinyl Alcohol-Povidone (REFRESH OP) Place 1 drop into both eyes 2 (two) times daily as needed (dry eyes).    . sildenafil (VIAGRA) 100 MG tablet Take 100 mg by mouth daily as needed for erectile dysfunction. Reported on 12/14/2015    . Lancets (ONETOUCH ULTRASOFT) lancets Check blood sugar no more than twice daily. 100 each 12  . ONETOUCH VERIO test strip CHECK BLOOD SUGAR NO MORE THAN TWICE DAILY 200 each 12    Home: Home Living Family/patient expects to be discharged to:: Private residence Living Arrangements: Spouse/significant other Available Help at Discharge: Family, Friend(s) Type of Home: House Home Access: Stairs to enter Technical brewer of Steps: 6 Entrance Stairs-Rails: Can reach both Home Layout: Multi-level Alternate Level Stairs-Number of Steps: 15 Alternate Level Stairs-Rails: Right Bathroom Shower/Tub: Multimedia programmer: Handicapped height Bathroom Accessibility: Yes Home Equipment: None  Lives With: Spouse  Functional History: Prior Function Level of Independence: Independent Comments: retired from [employer], driving  Functional Status:  Mobility: Bed Mobility Overal bed mobility: Needs Assistance Bed Mobility: Sit to Supine, Supine to Sit Rolling: Mod assist Sidelying to sit: Max assist Supine to sit: Mod assist Sit to supine: Mod assist General bed mobility comments: max verbal cues for sequencing and control of movement.  mod - max facilitation of Lt UE and LE to assist    Transfers Overall transfer level: Needs assistance Equipment used: 2 person hand held assist Transfers: Sit to/from Stand, Stand Pivot Transfers Sit to Stand: Mod assist, +2 physical assistance Stand pivot transfers: Max assist, +2 physical assistance General transfer comment: Did not attempt with +1 due to safety concerns and pushing.  Did work on sit to partial stand, keeping pt in flexed position to avoid full pushing - he required max A  Ambulation/Gait General Gait Details: unable to assess at this time.     ADL: ADL Overall ADL's : Needs assistance/impaired Eating/Feeding: Set up, Bed level Grooming: Wash/dry hands, Wash/dry face, Oral care, Set up, Bed level Upper Body Bathing: Moderate assistance, Bed level, Sitting Lower Body Bathing: Maximal assistance, Bed level Upper Body Dressing : Maximal assistance, Bed level, Sitting Lower Body Dressing: Total assistance, Bed level Toilet Transfer: Total assistance Toilet Transfer Details (indicate cue type and reason): unable to safely attempt with +1 assist  Toileting- Clothing Manipulation and Hygiene: Total assistance, Bed level Functional  mobility during ADLs: Maximal assistance  Cognition: Cognition Overall Cognitive Status: Impaired/Different from baseline Arousal/Alertness: Awake/alert Orientation Level: Oriented X4 Attention: Sustained Sustained Attention: Appears intact Memory: Impaired Memory Impairment: Storage deficit, Retrieval deficit Awareness: Impaired Awareness Impairment: Anticipatory impairment Problem Solving: (suspect difficulty with complex) Safety/Judgment: Appears intact Cognition Arousal/Alertness: Awake/alert Behavior During Therapy: WFL for tasks assessed/performed Overall Cognitive Status: Impaired/Different from baseline Area of Impairment: Attention, Following commands, Awareness, Problem solving Current Attention Level: Selective Memory: Decreased short-term memory Following Commands:  Follows one step commands consistently, Follows multi-step commands inconsistently Safety/Judgement: Decreased awareness of deficits, Decreased awareness of safety Awareness: Intellectual Problem Solving: Difficulty sequencing, Requires verbal cues, Requires tactile cues General Comments: decreased awareness of deficits and strong postural lean, able to correct 50% of the time with verbal cues, responds well to tactile cues on Rt side, decreased to absent sensation on the letft.   Blood pressure (!) 142/97, pulse 75, temperature (!) 97.5 F (36.4 C), resp. rate (!) 21, height 5\' 11"  (1.803 m), weight 84.7 kg, SpO2 94 %. Physical Exam  Vitals reviewed. Constitutional: He is oriented to person, place, and time. He appears well-developed.  HENT:  Head: Normocephalic and atraumatic.  Poor dentition  Eyes: EOM are normal. Right eye exhibits no discharge. Left eye exhibits no discharge.  Pupils reactive to light  Neck: Normal range of motion. Neck supple. No thyromegaly present.  Cardiovascular: Normal rate and regular rhythm.  Respiratory: Effort normal and breath sounds normal. No respiratory distress.  GI: Soft. Bowel sounds are normal. He exhibits no distension.  Musculoskeletal:  No edema or tenderness in extremities  Neurological: He is alert and oriented to person, place, and time.  Follows basic commands.   Fair awareness of deficits. Left facial droop Motor: LUE/LLE: 5/5 proximal to distal LUE: Shoulder abduction, elbow flex/ext 1/5, hand grip 3+/5 LLE: HF, KE 3-/5, ADF 2/5  Skin: Skin is warm and dry.  Psychiatric: He has a normal mood and affect. His behavior is normal. Thought content normal.    Results for orders placed or performed during the hospital encounter of 03/12/18 (from the past 24 hour(s))  Glucose, capillary     Status: Abnormal   Collection Time: 03/14/18  3:45 PM  Result Value Ref Range   Glucose-Capillary 143 (H) 70 - 99 mg/dL  Glucose, capillary      Status: Abnormal   Collection Time: 03/14/18  9:00 PM  Result Value Ref Range   Glucose-Capillary 114 (H) 70 - 99 mg/dL   Comment 1 Notify RN    Comment 2 Document in Chart   CBC     Status: None   Collection Time: 03/15/18  2:22 AM  Result Value Ref Range   WBC 8.7 4.0 - 10.5 K/uL   RBC 5.53 4.22 - 5.81 MIL/uL   Hemoglobin 15.5 13.0 - 17.0 g/dL   HCT 51.4 39.0 - 52.0 %   MCV 92.9 78.0 - 100.0 fL   MCH 28.0 26.0 - 34.0 pg   MCHC 30.2 30.0 - 36.0 g/dL   RDW 14.3 11.5 - 15.5 %   Platelets 267 150 - 400 K/uL  Basic metabolic panel     Status: Abnormal   Collection Time: 03/15/18  2:22 AM  Result Value Ref Range   Sodium 137 135 - 145 mmol/L   Potassium 4.4 3.5 - 5.1 mmol/L   Chloride 104 98 - 111 mmol/L   CO2 20 (L) 22 - 32 mmol/L   Glucose, Bld 116 (H) 70 - 99  mg/dL   BUN 18 8 - 23 mg/dL   Creatinine, Ser 0.95 0.61 - 1.24 mg/dL   Calcium 9.2 8.9 - 10.3 mg/dL   GFR calc non Af Amer >60 >60 mL/min   GFR calc Af Amer >60 >60 mL/min   Anion gap 13 5 - 15  Glucose, capillary     Status: Abnormal   Collection Time: 03/15/18  8:21 AM  Result Value Ref Range   Glucose-Capillary 125 (H) 70 - 99 mg/dL   Comment 1 Notify RN    Comment 2 Document in Chart   Glucose, capillary     Status: Abnormal   Collection Time: 03/15/18 11:27 AM  Result Value Ref Range   Glucose-Capillary 111 (H) 70 - 99 mg/dL   Comment 1 Notify RN    Comment 2 Document in Chart    Mr [name] Head Wo Contrast  Result Date: 03/13/2018 CLINICAL DATA:  Intracranial hemorrhage, continued surveillance. LEFT hemiparesis and hemisensory deficit. EXAM: MRI HEAD WITHOUT AND WITH CONTRAST MRA HEAD WITHOUT CONTRAST TECHNIQUE: Multiplanar, multiecho pulse sequences of the brain and surrounding structures were obtained without and with intravenous contrast. Angiographic images of the head were obtained using MRA technique without contrast. CONTRAST:  Gadavist, 7 mL. COMPARISON:  CT head 03/12/2018 FINDINGS: MRI HEAD FINDINGS  Brain: 24 x 19 x 23 mm RIGHT thalamic hemorrhage, is redemonstrated, approximate 5 mL volume, essentially unchanged from priors. Mild surrounding edema. No abnormality on diffusion-weighted imaging outside of the area of the bleed. Mild atrophy. Mild subcortical and periventricular T2 and FLAIR hyperintensities, likely chronic microvascular ischemic change. Post infusion, no concerning areas of abnormal enhancement. Vascular: Flow voids are maintained. No areas of acute or chronic hemorrhage are identified elsewhere on susceptibility weighted imaging. Skull and upper cervical spine: Unremarkable skull base. Mild cervical spondylosis. Sinuses/Orbits: No significant paranasal sinus disease. Other: None. MRA HEAD FINDINGS Dominant LEFT internal carotid artery, but both are widely patent. No MCA proximal stenosis or branch occlusion. Hypoplastic RIGHT A1 ACA. Dominant LEFT A1 ACA feeds both anterior cerebral is a. basilar artery widely patent with vertebrals codominant. No PCA stenosis or occlusion. No cerebellar branch occlusion. IMPRESSION: Approximate 5 mL RIGHT thalamic hemorrhage, mild surrounding edema. No abnormal postcontrast enhancement or diffusion abnormality outside of the area of the bleed. Mild surrounding edema without midline shift. 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 Electronically Signed   By: Staci Righter M.D.   On: 03/13/2018 18:14   Mr [name] TD Contrast  Result Date: 03/13/2018 CLINICAL DATA:  Intracranial hemorrhage, continued surveillance. LEFT hemiparesis and hemisensory deficit. EXAM: MRI HEAD WITHOUT AND WITH CONTRAST MRA HEAD WITHOUT CONTRAST TECHNIQUE: Multiplanar, multiecho pulse sequences of the brain and surrounding structures were obtained without and with intravenous contrast.  Angiographic images of the head were obtained using MRA technique without contrast. CONTRAST:  Gadavist, 7 mL. COMPARISON:  CT head 03/12/2018 FINDINGS: MRI HEAD FINDINGS Brain: 24 x 19 x 23 mm RIGHT thalamic hemorrhage, is redemonstrated, approximate 5 mL volume, essentially unchanged from priors. Mild surrounding edema. No abnormality on diffusion-weighted imaging outside of the area of the bleed. Mild atrophy. Mild subcortical and periventricular T2 and FLAIR hyperintensities, likely chronic microvascular ischemic change. Post infusion, no concerning areas of abnormal enhancement. Vascular: Flow voids are maintained. No areas of acute or chronic hemorrhage are identified elsewhere  on susceptibility weighted imaging. Skull and upper cervical spine: Unremarkable skull base. Mild cervical spondylosis. Sinuses/Orbits: No significant paranasal sinus disease. Other: None. MRA HEAD FINDINGS Dominant LEFT internal carotid artery, but both are widely patent. No MCA proximal stenosis or branch occlusion. Hypoplastic RIGHT A1 ACA. Dominant LEFT A1 ACA feeds both anterior cerebral is a. basilar artery widely patent with vertebrals codominant. No PCA stenosis or occlusion. No cerebellar branch occlusion. IMPRESSION: Approximate 5 mL RIGHT thalamic hemorrhage, mild surrounding edema. No abnormal postcontrast enhancement or diffusion abnormality outside of the area of the bleed. Mild surrounding edema without midline shift. 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 Electronically Signed   By: Staci Righter M.D.   On: 03/13/2018 18:14    Assessment/Plan: Diagnosis: Right thalamic hemorrhage Labs and images (see above) independently reviewed.  Records reviewed and summated above. Stroke: Continue secondary stroke prophylaxis  and Risk Factor Modification listed below:   Blood Pressure Management:  Continue current medication with prn's with permisive HTN per primary team Diabetes management:   Left sided hemiparesis: fit for orthosis to prevent contractures (resting hand splint for day, wrist cock up splint at night, PRAFO, etc) Motor recovery: Fluoxetine  1. Does the need for close, 24 hr/day medical supervision in concert with the patient's rehab needs make it unreasonable for this patient to be served in a less intensive setting? Yes  Co-Morbidities requiring supervision/potential complications: diabetes mellitus (Monitor in accordance with exercise and adjust meds as necessary), HTN (monitor and provide prns in accordance with increased physical exertion and pain), hyperlipidemia (cont meds), PAF (cont meds, monitor HR with increased mobility) 2. Due to bladder management, safety, disease management and patient education, does the patient require 24 hr/day rehab nursing? Yes 3. Does the patient require coordinated care of a physician, rehab nurse, PT (1-2 hrs/day, 5 days/week) and OT (1-2 hrs/day, 5 days/week) to address physical and functional deficits in the context of the above medical diagnosis(es)? Yes Addressing deficits in the following areas: balance, endurance, locomotion, strength, transferring, bowel/bladder control, bathing, dressing, toileting and psychosocial support 4. Can the patient actively participate in an intensive therapy program of at least 3 hrs of therapy per day at least 5 days per week? Yes 5. The potential for patient to make measurable gains while on inpatient rehab is excellent 6. Anticipated functional outcomes upon discharge from inpatient rehab are supervision and min assist  with PT, supervision and min assist with OT, n/a with SLP. 7. Estimated rehab length of stay to reach the above functional goals is: 16-18 days. 8. Anticipated D/C setting: Home 9. Anticipated post D/C treatments:  HH therapy and Home excercise program 10. Overall Rehab/Functional Prognosis: good  RECOMMENDATIONS: This patient's condition is appropriate for continued rehabilitative care in the following setting: CIR Patient has agreed to participate in recommended program. Yes Note that insurance prior authorization may be required for reimbursement for recommended care.  Comment: Rehab Admissions Coordinator to follow up.   I have personally performed a face to face diagnostic evaluation, including, but not limited to relevant history and physical exam findings, of this patient and developed relevant assessment and plan.  Additionally, I have reviewed and concur with the physician assistant's documentation above.   Delice Lesch, MD, ABPMR Lavon Paganini Angiulli, PA-C 03/15/2018

## 2018-03-15 NOTE — Progress Notes (Signed)
Physical Therapy Treatment Patient Details Name: Marcus Beasley MRN: [id number] DOB: June 08, 1946 Today's Date: 03/15/2018    History of Present Illness 72 y.o. male admitted with facial numbness, slurred speech, leg weakness, and facial droop with right thalamic and corona radiata ICH. PMhx: macular degeneration, HLD, HTN, DM and BPH     PT Comments    Patient progressing with sitting balance and L LE movement into antigravity postures this session.  Still significantly limited but impaired balance/orientation and he will continue to benefit from skilled PT in the acute setting and continues to be appropriate for CIR level rehab upon d/c.  Follow Up Recommendations  CIR;Supervision for mobility/OOB     Equipment Recommendations  Other (comment);3in1 (PT);Wheelchair (measurements PT)(TBA)    Recommendations for Other Services       Precautions / Restrictions Precautions Precautions: Fall Precaution Comments: significant L lateral lean in standing    Mobility  Bed Mobility Overal bed mobility: Needs Assistance Bed Mobility: Rolling;Sidelying to Sit Rolling: Mod assist Sidelying to sit: Mod assist       General bed mobility comments: cues for technique and assist to reach to rail and bring L leg off bed and for trunk righting  Transfers Overall transfer level: Needs assistance Equipment used: Rolling walker (2 wheeled) Transfers: Sit to/from [facility] Sit to Stand: Mod assist;+2 physical assistance Stand pivot transfers: Max assist;+2 physical assistance       General transfer comment: stood from EOB with assist but able to power up with LE's and R UE.  Standing able to take step uncoordinated with L LE, but max cues and facilitation for R lateral lean due to heavy L lateral lean.  Able to step around with L but not move R LE except to pivot wtih cues, brought chair up to patient and assist to sit.   Ambulation/Gait                 Stairs             Wheelchair Mobility    Modified Rankin (Stroke Patients Only) Modified Rankin (Stroke Patients Only) Pre-Morbid Rankin Score: No symptoms Modified Rankin: Severe disability     Balance Overall balance assessment: Needs assistance Sitting-balance support: Feet supported;No upper extremity supported Sitting balance-Leahy Scale: Poor Sitting balance - Comments: sitting EOB with R hand holding footboard with S level A.  Over time able to take R hand off footboard and sit with S no UE support. Postural control: Left lateral lean Standing balance support: Bilateral upper extremity supported;Single extremity supported Standing balance-Leahy Scale: Zero Standing balance comment: leaning L and difficulty with postural orientation as more assist equals more pushing to L.  moving R LE to R more when attempting to adjust posture and to pivot to chair                            Cognition Arousal/Alertness: Awake/alert Behavior During Therapy: WFL for tasks assessed/performed Overall Cognitive Status: Impaired/Different from baseline Area of Impairment: Attention;Following commands;Awareness;Problem solving                   Current Attention Level: Selective Memory: Decreased short-term memory Following Commands: Follows one step commands with increased time Safety/Judgement: Decreased awareness of deficits;Decreased awareness of safety   Problem Solving: Difficulty sequencing;Requires verbal cues;Requires tactile cues        Exercises Other Exercises Other Exercises: 5 leg lifts bilaterally with knee flex/ext in supine  General Comments General comments (skin integrity, edema, etc.): wife present; VSS throughout      Pertinent Vitals/Pain Pain Assessment: No/denies pain    Home Living                      Prior Function            PT Goals (current goals can now be found in the care plan section) Progress towards PT goals:  Progressing toward goals    Frequency    Min 4X/week      PT Plan Current plan remains appropriate    Co-evaluation              AM-PAC PT "6 Clicks" Daily Activity  Outcome Measure  Difficulty turning over in bed (including adjusting bedclothes, sheets and blankets)?: Unable Difficulty moving from lying on back to sitting on the side of the bed? : Unable Difficulty sitting down on and standing up from a chair with arms (e.g., wheelchair, bedside commode, etc,.)?: Unable Help needed moving to and from a bed to chair (including a wheelchair)?: Total Help needed walking in hospital room?: Total Help needed climbing 3-5 steps with a railing? 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

## 2018-03-15 NOTE — Progress Notes (Signed)
STROKE TEAM PROGRESS NOTE   SUBJECTIVE (INTERVAL HISTORY) His wife is at the bedside. Pt neuro stable, still has right hemiparesis. afib rate controlled with Cardizem IV.  Cardiology on board, transition from IV to p.o., continue metoprolol.  Still has left hemiparesis, PT/OT recommend CIR.   OBJECTIVE Vitals:   03/15/18 1100 03/15/18 1200 03/15/18 1300 03/15/18 1400  BP: (!) 133/96 (!) 142/97 127/90 (!) 121/91  Pulse: 72 75 75 76  Resp: 18 (!) 21 20 17   Temp:  98.4 F (36.9 C)    TempSrc:  Oral    SpO2: 95% 94% 95% 95%  Weight:      Height:        CBC:  Recent Labs  Lab 03/12/18 1119  03/14/18 0237 03/15/18 0222  WBC 6.1   < > 9.8 8.7  NEUTROABS 3.1  --   --   --   HGB 13.8   < > 14.5 15.5  HCT 43.5   < > 45.5 51.4  MCV 86.8   < > 87.5 92.9  PLT 243   < > 251 267   < > = values in this interval not displayed.    Basic Metabolic Panel:  Recent Labs  Lab 03/14/18 0237 03/15/18 0222  NA 138 137  K 4.0 4.4  CL 105 104  CO2 23 20*  GLUCOSE 114* 116*  BUN 18 18  CREATININE 1.08 0.95  CALCIUM 9.0 9.2    Lipid Panel:     Component Value Date/Time   CHOL 165 03/13/2018 0400   TRIG 153 (H) 03/13/2018 0400   HDL 40 (L) 03/13/2018 0400   CHOLHDL 4.1 03/13/2018 0400   VLDL 31 03/13/2018 0400   LDLCALC 94 03/13/2018 0400   HgbA1c:  Lab Results  Component Value Date   HGBA1C 6.4 (H) 03/13/2018   Urine Drug Screen:     Component Value Date/Time   LABOPIA NONE DETECTED 03/12/2018 1335   COCAINSCRNUR NONE DETECTED 03/12/2018 1335   LABBENZ NONE DETECTED 03/12/2018 1335   AMPHETMU NONE DETECTED 03/12/2018 1335   THCU NONE DETECTED 03/12/2018 1335   LABBARB NONE DETECTED 03/12/2018 1335    Alcohol Level     Component Value Date/Time   ETH <10 03/12/2018 1118    IMAGING  Mr Marcus Beasley Head Wo Contrast  Result Date: 03/13/2018 CLINICAL DATA:  Intracranial hemorrhage, continued surveillance. LEFT hemiparesis and hemisensory deficit. EXAM: MRI HEAD WITHOUT AND  WITH CONTRAST MRA HEAD WITHOUT CONTRAST TECHNIQUE: Multiplanar, multiecho pulse sequences of the brain and surrounding structures were obtained without and with intravenous contrast. Angiographic images of the head were obtained using MRA technique without contrast. CONTRAST:  Gadavist, 7 mL. COMPARISON:  CT head 03/12/2018 FINDINGS: MRI HEAD FINDINGS Brain: 24 x 19 x 23 mm RIGHT thalamic hemorrhage, is redemonstrated, approximate 5 mL volume, essentially unchanged from priors. Mild surrounding edema. No abnormality on diffusion-weighted imaging outside of the area of the bleed. Mild atrophy. Mild subcortical and periventricular T2 and FLAIR hyperintensities, likely chronic microvascular ischemic change. Post infusion, no concerning areas of abnormal enhancement. Vascular: Flow voids are maintained. No areas of acute or chronic hemorrhage are identified elsewhere on susceptibility weighted imaging. Skull and upper cervical spine: Unremarkable skull base. Mild cervical spondylosis. Sinuses/Orbits: No significant paranasal sinus disease. Other: None. MRA HEAD FINDINGS Dominant LEFT internal carotid artery, but both are widely patent. No MCA proximal stenosis or branch occlusion. Hypoplastic RIGHT A1 ACA. Dominant LEFT A1 ACA feeds both anterior cerebral is a. basilar artery widely  patent with vertebrals codominant. No PCA stenosis or occlusion. No cerebellar branch occlusion. IMPRESSION: Approximate 5 mL RIGHT thalamic hemorrhage, mild surrounding edema. No abnormal postcontrast enhancement or diffusion abnormality outside of the area of the bleed. Mild surrounding edema without midline shift. 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 Electronically Signed   By: Staci Righter M.D.   On: 03/13/2018 18:14   Mr Marcus Beasley  DG Contrast  Result Date: 03/13/2018 CLINICAL DATA:  Intracranial hemorrhage, continued surveillance. LEFT hemiparesis and hemisensory deficit. EXAM: MRI HEAD WITHOUT AND WITH CONTRAST MRA HEAD WITHOUT CONTRAST TECHNIQUE: Multiplanar, multiecho pulse sequences of the brain and surrounding structures were obtained without and with intravenous contrast. Angiographic images of the head were obtained using MRA technique without contrast. CONTRAST:  Gadavist, 7 mL. COMPARISON:  CT head 03/12/2018 FINDINGS: MRI HEAD FINDINGS Brain: 24 x 19 x 23 mm RIGHT thalamic hemorrhage, is redemonstrated, approximate 5 mL volume, essentially unchanged from priors. Mild surrounding edema. No abnormality on diffusion-weighted imaging outside of the area of the bleed. Mild atrophy. Mild subcortical and periventricular T2 and FLAIR hyperintensities, likely chronic microvascular ischemic change. Post infusion, no concerning areas of abnormal enhancement. Vascular: Flow voids are maintained. No areas of acute or chronic hemorrhage are identified elsewhere on susceptibility weighted imaging. Skull and upper cervical spine: Unremarkable skull base. Mild cervical spondylosis. Sinuses/Orbits: No significant paranasal sinus disease. Other: None. MRA HEAD FINDINGS Dominant LEFT internal carotid artery, but both are widely patent. No MCA proximal stenosis or branch occlusion. Hypoplastic RIGHT A1 ACA. Dominant LEFT A1 ACA feeds both anterior cerebral is a. basilar artery widely patent with vertebrals codominant. No PCA stenosis or occlusion. No cerebellar branch occlusion. IMPRESSION: Approximate 5 mL RIGHT thalamic hemorrhage, mild surrounding edema. No abnormal postcontrast enhancement or diffusion abnormality outside of the area of the bleed. Mild surrounding edema without midline shift. 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 The clinical history, in conjunction with imaging findings, is  most consistent with a hypertensive related hemorrhage with epicenter in the thalamus. Electronically Signed   By: Staci Righter M.D.   On: 03/13/2018 18:14   Ct Head Code Stroke Wo Contrast  Result Date: 03/12/2018 CLINICAL DATA:  Code stroke. Left-sided facial droop. Left leg weakness. EXAM: CT HEAD WITHOUT CONTRAST TECHNIQUE: Contiguous axial images were obtained from the base of the skull through the vertex without intravenous contrast. COMPARISON:  None. FINDINGS: Brain: 1.5 x 2.8 x 1.9 cm high-density hematoma in the right thalamus and adjacent corona radiata. Thin rim of surrounding edema. No indication of underlying infarct or mass. No hydrocephalus or extra-axial collection. No ventricular or subarachnoid extension. Vascular: Mild arterial calcification. Skull: No acute finding. Sinuses/Orbits: Negative Other: Critical Value/emergent results were called by telephone at the time of interpretation on 03/12/2018 at 11:23 am to Dr. Rory Percy , who verbally acknowledged these results. ASPECTS [facility] Stroke Program Early CT Score) Not scored in the setting of acute hemorrhage. IMPRESSION: 4 cc acute hematoma in the right thalamus and corona radiata, often hypertensive in this location. Electronically Signed   By: Monte Fantasia M.D.   On: 03/12/2018 11:27    Transthoracic Echocardiogram Left ventricle: The cavity size was normal. Wall thickness was   increased in a pattern of mild LVH. Systolic function was   vigorous. The  estimated ejection fraction was in the range of 65%   to 70%. Wall motion was normal; there were no regional wall   motion abnormalities. - Aortic valve: There was trivial regurgitation.   Bilateral Carotid Dopplers  1-39% ICA stenosis bilaterally.   PHYSICAL EXAM Temp:  [97.4 F (36.3 C)-99.2 F (37.3 C)] 98.4 F (36.9 C) (09/23 1200) Pulse Rate:  [40-111] 76 (09/23 1400) Resp:  [17-26] 17 (09/23 1400) BP: (104-149)/(76-102) 121/91 (09/23 1400) SpO2:  [91 %-97 %] 95 %  (09/23 1400)  General - Well nourished, well developed, in no apparent distress.  Ophthalmologic - fundi not visualized due to noncooperation.  Cardiovascular - irregularly irregular heart rate and rhythm with RVR.  Mental Status -  Level of arousal and orientation to time, place, and person were intact. Language including expression, naming, repetition, comprehension was assessed and found intact. Fund of Knowledge was assessed and was intact.  Cranial Nerves II - XII - II - Visual field intact OU. III, IV, VI - Extraocular movements intact. V - Facial sensation intact bilaterally. VII - mild left facial droop. VIII - Hearing & vestibular intact bilaterally. X - Palate elevates symmetrically. XI - Chin turning & shoulder shrug intact bilaterally. XII - Tongue protrusion intact.  Motor Strength - The patient's strength was normal in RUE and RLE, but LUE 2/5 proximal, 3/5 distal, LLE 3/5 proximal and distal and pronator drift was absent.  Bulk was normal and fasciculations were absent.   Motor Tone - Muscle tone was assessed at the neck and appendages and was normal.  Reflexes - The patient's reflexes were symmetrical in all extremities and he had no pathological reflexes.  Sensory - Light touch, temperature/pinprick were assessed and were decreased on the left, LUE and LLE 50% comparing with right.    Coordination - The patient had normal movements in the right hand with no ataxia or dysmetria.  Tremor was absent.  Gait and Station - deferred.    ASSESSMENT/PLAN [name] is a 72 y.o. male with history of hyperlipidemia, hypertension, diabetes mellitus, and BPH presenting with facial numbness, slurred speech, leg weakness, and facial droop. He did not receive IV t-PA due to [place].   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  Resultant  Left hemiparesis, left hemianesthesia  CT head - 4 cc acute hematoma in the right thalamus and corona  radiata  MRI head - right thalamic ICH, stable in size, no abnormal enhancement  MRA head negative for aneurysm or AVM  Carotid Doppler unremarkable  2D Echo EF 65-70%  LDL - 94  HgbA1c - 6.4  UDS neg  VTE prophylaxis - heparin subq  Diet - Heart healthy / carb modified with thin liquids.  aspirin 81 mg daily prior to admission, now on No antithrombotic  Patient counseled to be compliant with his antithrombotic medications  Ongoing aggressive stroke risk factor management  Therapy recommendations: CIR  Disposition:  Pending  Paroxysmal Afib with RVR  New onset  On metoprolol IV as needed  On Cardizem drip -transition to Cardizem CD 180 mg daily  Continue metoprolol 50 mg twice daily  cardiology consult appreciated  Hypertension  Stable off cleviprex . Systolic blood pressure goal < 160 mmHg . Home po meds - Ziac . Put on metoprolol and Cardizem . Long-term BP goal normotensive  Hyperlipidemia  Lipid lowering medication PTA:  Lipitor 20 mg daily  LDL 94, goal < 70  Consider increasing Lipitor to 40 mg daily  at time of discharge  Diabetes  HgbA1c 6.4, goal < 7.0  Controlled  On metformin  SSI  CBG monitoring  Other Stroke Risk Factors  Advanced age  ETOH use, advised to drink no more than 1 alcoholic beverage per day   Other Active Problems  Hospital day # 3  This patient is critically ill due to [place], hypertensive emergency, A. fib RVR and at significant risk of neurological worsening, death form hematoma expansion, heart failure, ischemic stroke, seizure, hypertensive encephalopathy. This patient's care requires constant monitoring of vital signs, hemodynamics, respiratory and cardiac monitoring, review of multiple databases, neurological assessment, discussion with family, other specialists and medical decision making of high complexity. I spent 35 minutes of neurocritical care time in the care of this patient. I had long discussion with  wife and patient at bedside, updated pt current condition, treatment plan and potential prognosis. They expressed understanding and appreciation.    Rosalin Hawking, MD PhD Stroke Neurology 03/15/2018 2:41 PM      To contact Stroke Continuity provider, please refer to [email or web address] After hours, contact General Neurology

## 2018-03-15 NOTE — Progress Notes (Signed)
Patient arrived to the floor alert and oriented X4 Denies Pain  All questions and concerns addressed Bed in the lowest position with alarm set and call light in reach.

## 2018-03-15 NOTE — Progress Notes (Signed)
Attempted to call report to 3W. Shanigua Gibb D  

## 2018-03-16 DIAGNOSIS — R531 Weakness: Secondary | ICD-10-CM

## 2018-03-16 LAB — GLUCOSE, CAPILLARY
Glucose-Capillary: 107 mg/dL — ABNORMAL HIGH (ref 70–99)
Glucose-Capillary: 108 mg/dL — ABNORMAL HIGH (ref 70–99)
Glucose-Capillary: 110 mg/dL — ABNORMAL HIGH (ref 70–99)
Glucose-Capillary: 101 mg/dL — ABNORMAL HIGH (ref 70–99)

## 2018-03-16 LAB — CBC
HCT: 45.7 % (ref 39.0–52.0)
Hemoglobin: 14.5 g/dL (ref 13.0–17.0)
MCH: 27.8 pg (ref 26.0–34.0)
MCHC: 31.7 g/dL (ref 30.0–36.0)
MCV: 87.7 fL (ref 78.0–100.0)
Platelets: 271 10*3/uL (ref 150–400)
RBC: 5.21 MIL/uL (ref 4.22–5.81)
RDW: 13.7 % (ref 11.5–15.5)
WBC: 11 10*3/uL — ABNORMAL HIGH (ref 4.0–10.5)

## 2018-03-16 LAB — BASIC METABOLIC PANEL
Anion gap: 12 (ref 5–15)
BUN: 18 mg/dL (ref 8–23)
CO2: 23 mmol/L (ref 22–32)
Calcium: 9.4 mg/dL (ref 8.9–10.3)
Chloride: 103 mmol/L (ref 98–111)
Creatinine, Ser: 0.97 mg/dL (ref 0.61–1.24)
GFR calc Af Amer: >60 mL/min (ref >60)
GFR calc non Af Amer: >60 mL/min (ref >60)
Glucose, Bld: 123 mg/dL — ABNORMAL HIGH (ref 70–99)
Potassium: 4.3 mmol/L (ref 3.5–5.1)
Sodium: 138 mmol/L (ref 135–145)

## 2018-03-16 MED ORDER — ZOLPIDEM TARTRATE 5 MG PO TABS
5.0000 mg | ORAL_TABLET | Freq: Every evening | ORAL | Status: DC | PRN
  Administered 2018-03-16: 5 mg via ORAL
  Filled 2018-03-16: qty 1

## 2018-03-16 NOTE — Progress Notes (Addendum)
STROKE TEAM PROGRESS NOTE   SUBJECTIVE (INTERVAL HISTORY) His wife is at the bedside. Neuro stable, still has right hemiparesis. afib rate controlled. Weaned off Cardizem IV, tol PO now. PT/OT recommend CIR. He has per-cert that needs to be started with insurance. I have d/w Case mgt.    OBJECTIVE Vitals:   03/15/18 2355 03/16/18 0407 03/16/18 0829 03/16/18 1127  BP: (!) 147/92 (!) 166/92 (!) 148/91 (!) 139/101  Pulse:  70 78 87  Resp:  18 16 20   Temp:  98.2 F (36.8 C) 98.6 F (37 C)   TempSrc:  Oral Oral   SpO2:  98% 99% 98%  Weight:      Height:        CBC:  Recent Labs  Lab 03/12/18 1119  03/15/18 0222 03/16/18 0536  WBC 6.1   < > 8.7 11.0*  NEUTROABS 3.1  --   --   --   HGB 13.8   < > 15.5 14.5  HCT 43.5   < > 51.4 45.7  MCV 86.8   < > 92.9 87.7  PLT 243   < > 267 271   < > = values in this interval not displayed.    Basic Metabolic Panel:  Recent Labs  Lab 03/15/18 0222 03/16/18 0536  NA 137 138  K 4.4 4.3  CL 104 103  CO2 20* 23  GLUCOSE 116* 123*  BUN 18 18  CREATININE 0.95 0.97  CALCIUM 9.2 9.4    Lipid Panel:     Component Value Date/Time   CHOL 165 03/13/2018 0400   TRIG 153 (H) 03/13/2018 0400   HDL 40 (L) 03/13/2018 0400   CHOLHDL 4.1 03/13/2018 0400   VLDL 31 03/13/2018 0400   LDLCALC 94 03/13/2018 0400   HgbA1c:  Lab Results  Component Value Date   HGBA1C 6.4 (H) 03/13/2018   Urine Drug Screen:     Component Value Date/Time   LABOPIA NONE DETECTED 03/12/2018 1335   COCAINSCRNUR NONE DETECTED 03/12/2018 1335   LABBENZ NONE DETECTED 03/12/2018 1335   AMPHETMU NONE DETECTED 03/12/2018 1335   THCU NONE DETECTED 03/12/2018 1335   LABBARB NONE DETECTED 03/12/2018 1335    Alcohol Level     Component Value Date/Time   ETH <10 03/12/2018 1118    IMAGING  Mr [name] Head Wo Contrast  Result Date: 03/13/2018 CLINICAL DATA:  Intracranial hemorrhage, continued surveillance. LEFT hemiparesis and hemisensory deficit. EXAM: MRI HEAD  WITHOUT AND WITH CONTRAST MRA HEAD WITHOUT CONTRAST TECHNIQUE: Multiplanar, multiecho pulse sequences of the brain and surrounding structures were obtained without and with intravenous contrast. Angiographic images of the head were obtained using MRA technique without contrast. CONTRAST:  Gadavist, 7 mL. COMPARISON:  CT head 03/12/2018 FINDINGS: MRI HEAD FINDINGS Brain: 24 x 19 x 23 mm RIGHT thalamic hemorrhage, is redemonstrated, approximate 5 mL volume, essentially unchanged from priors. Mild surrounding edema. No abnormality on diffusion-weighted imaging outside of the area of the bleed. Mild atrophy. Mild subcortical and periventricular T2 and FLAIR hyperintensities, likely chronic microvascular ischemic change. Post infusion, no concerning areas of abnormal enhancement. Vascular: Flow voids are maintained. No areas of acute or chronic hemorrhage are identified elsewhere on susceptibility weighted imaging. Skull and upper cervical spine: Unremarkable skull base. Mild cervical spondylosis. Sinuses/Orbits: No significant paranasal sinus disease. Other: None. MRA HEAD FINDINGS Dominant LEFT internal carotid artery, but both are widely patent. No MCA proximal stenosis or branch occlusion. Hypoplastic RIGHT A1 ACA. Dominant LEFT A1 ACA feeds both anterior  cerebral is a. basilar artery widely patent with vertebrals codominant. No PCA stenosis or occlusion. No cerebellar branch occlusion. IMPRESSION: Approximate 5 mL RIGHT thalamic hemorrhage, mild surrounding edema. No abnormal postcontrast enhancement or diffusion abnormality outside of the area of the bleed. Mild surrounding edema without midline shift. 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 Electronically Signed   By: Staci Righter M.D.   On: 03/13/2018 18:14    Mr [name] OE Contrast  Result Date: 03/13/2018 CLINICAL DATA:  Intracranial hemorrhage, continued surveillance. LEFT hemiparesis and hemisensory deficit. EXAM: MRI HEAD WITHOUT AND WITH CONTRAST MRA HEAD WITHOUT CONTRAST TECHNIQUE: Multiplanar, multiecho pulse sequences of the brain and surrounding structures were obtained without and with intravenous contrast. Angiographic images of the head were obtained using MRA technique without contrast. CONTRAST:  Gadavist, 7 mL. COMPARISON:  CT head 03/12/2018 FINDINGS: MRI HEAD FINDINGS Brain: 24 x 19 x 23 mm RIGHT thalamic hemorrhage, is redemonstrated, approximate 5 mL volume, essentially unchanged from priors. Mild surrounding edema. No abnormality on diffusion-weighted imaging outside of the area of the bleed. Mild atrophy. Mild subcortical and periventricular T2 and FLAIR hyperintensities, likely chronic microvascular ischemic change. Post infusion, no concerning areas of abnormal enhancement. Vascular: Flow voids are maintained. No areas of acute or chronic hemorrhage are identified elsewhere on susceptibility weighted imaging. Skull and upper cervical spine: Unremarkable skull base. Mild cervical spondylosis. Sinuses/Orbits: No significant paranasal sinus disease. Other: None. MRA HEAD FINDINGS Dominant LEFT internal carotid artery, but both are widely patent. No MCA proximal stenosis or branch occlusion. Hypoplastic RIGHT A1 ACA. Dominant LEFT A1 ACA feeds both anterior cerebral is a. basilar artery widely patent with vertebrals codominant. No PCA stenosis or occlusion. No cerebellar branch occlusion. IMPRESSION: Approximate 5 mL RIGHT thalamic hemorrhage, mild surrounding edema. No abnormal postcontrast enhancement or diffusion abnormality outside of the area of the bleed. Mild surrounding edema without midline shift. 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 The clinical history, in conjunction with  imaging findings, is most consistent with a hypertensive related hemorrhage with epicenter in the thalamus. Electronically Signed   By: Staci Righter M.D.   On: 03/13/2018 18:14   Ct Head Code Stroke Wo Contrast  Result Date: 03/12/2018 CLINICAL DATA:  Code stroke. Left-sided facial droop. Left leg weakness. EXAM: CT HEAD WITHOUT CONTRAST TECHNIQUE: Contiguous axial images were obtained from the base of the skull through the vertex without intravenous contrast. COMPARISON:  None. FINDINGS: Brain: 1.5 x 2.8 x 1.9 cm high-density hematoma in the right thalamus and adjacent corona radiata. Thin rim of surrounding edema. No indication of underlying infarct or mass. No hydrocephalus or extra-axial collection. No ventricular or subarachnoid extension. Vascular: Mild arterial calcification. Skull: No acute finding. Sinuses/Orbits: Negative Other: Critical Value/emergent results were called by telephone at the time of interpretation on 03/12/2018 at 11:23 am to Dr. Rory Percy , who verbally acknowledged these results. ASPECTS [facility] Stroke Program Early CT Score) Not scored in the setting of acute hemorrhage. IMPRESSION: 4 cc acute hematoma in the right thalamus and corona radiata, often hypertensive in this location. Electronically Signed   By: Monte Fantasia M.D.   On: 03/12/2018 11:27    Transthoracic Echocardiogram Left ventricle: The cavity size was normal. Wall thickness was   increased in a pattern of mild LVH. Systolic  function was   vigorous. The estimated ejection fraction was in the range of 65%   to 70%. Wall motion was normal; there were no regional wall   motion abnormalities. - Aortic valve: There was trivial regurgitation.   Bilateral Carotid Dopplers  1-39% ICA stenosis bilaterally.   PHYSICAL EXAM Temp:  [97.5 F (36.4 C)-98.7 F (37.1 C)] 98.6 F (37 C) (09/24 0829) Pulse Rate:  [70-87] 87 (09/24 1127) Resp:  [16-20] 20 (09/24 1127) BP: (121-166)/(87-101) 139/101 (09/24  1127) SpO2:  [95 %-100 %] 98 % (09/24 1127)  General - Well nourished, well developed, in no apparent distress.  Ophthalmologic - fundi not visualized due to noncooperation.  Cardiovascular - irregularly irregular heart rate and rhythm, rate better controlled now.  Mental Status -  Level of arousal and orientation to time, place, and person were intact. Language including expression, naming, repetition, comprehension was assessed and found intact. Fund of Knowledge was assessed and was intact.  Cranial Nerves II - XII - II - Visual field intact OU. III, IV, VI - Extraocular movements intact. V - Facial sensation intact bilaterally. VII - mild left facial droop. VIII - Hearing & vestibular intact bilaterally. X - Palate elevates symmetrically. XI - Chin turning & shoulder shrug intact bilaterally. XII - Tongue protrusion intact.  Motor Strength - The patient's strength was normal in RUE and RLE, but LUE 2/5 proximal, 3/5 distal, LLE 3/5 proximal and distal and pronator drift was absent.  Bulk was normal and fasciculations were absent.   Motor Tone - Muscle tone was assessed at the neck and appendages and was normal.  Reflexes - The patient's reflexes were symmetrical in all extremities and he had no pathological reflexes.  Sensory - Light touch, temperature/pinprick were assessed and were decreased on the left, LUE and LLE 50% comparing with right.    Coordination - The patient had normal movements in the right hand with no ataxia or dysmetria.  Tremor was absent.  Gait and Station - deferred.    ASSESSMENT/PLAN [name] is a 72 y.o. male with history of hyperlipidemia, hypertension, diabetes mellitus, and BPH presenting with facial numbness, slurred speech, leg weakness, and facial droop. He did not receive IV t-PA due to [place].   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  Resultant  Left hemiparesis, left hemianesthesia  CT head - 4 cc acute  hematoma in the right thalamus and corona radiata  MRI head - right thalamic ICH, stable in size, no abnormal enhancement  MRA head negative for aneurysm or AVM  Carotid Doppler unremarkable  2D Echo EF 65-70%  LDL - 94  HgbA1c - 6.4  UDS neg  VTE prophylaxis - heparin subq  Diet - Heart healthy / carb modified with thin liquids.  aspirin 81 mg daily prior to admission, now on No antithrombotic  Patient counseled to be compliant with his antithrombotic medications  Ongoing aggressive stroke risk factor management  Therapy recommendations: CIR  Disposition:  Pending pre-cert per Case mgt  Paroxysmal Afib with RVR  New onset  On metoprolol IV as needed  On Cardizem drip -transition to Cardizem CD 180 mg daily  Continue metoprolol 50 mg twice daily  cardiology consult appreciated  Hypertension  Stable off cleviprex . Systolic blood pressure goal < 160 mmHg . Home po meds - Ziac . Put on metoprolol and Cardizem . Long-term BP goal normotensive  Hyperlipidemia  Lipid lowering medication PTA:  Lipitor 20 mg daily  LDL  94, goal < 70  Consider increasing Lipitor to 40 mg daily at time of discharge  Diabetes  HgbA1c 6.4, goal < 7.0  Controlled  On metformin  SSI  CBG monitoring  Other Stroke Risk Factors  Advanced age  ETOH use, advised to drink no more than 1 alcoholic beverage per day   Other [facility] day # [place], ARNP-C Stroke Neurology 03/16/2018 12:06 PM  To contact Stroke Continuity provider, please refer to [email or web address] After hours, contact General Neurology  ATTENDING NOTE: I reviewed above note and agree with the assessment and plan. Pt was seen and examined.   Patient neuro stable, still has left hemiparesis.  Pending CIR, working with insurance at this time.  Patient complains of insomnia at night, will give Ambien 5 mg as needed at night.  Heart rate controlled, still in A. fib  rhythm.  Rosalin Hawking, MD PhD Stroke Neurology 03/16/2018 6:08 PM

## 2018-03-16 NOTE — Care Management Important Message (Signed)
Important Message  Patient Details  Name: Marcus Beasley MRN: [id number] Date of Birth: [birth year]   Medicare Important Message Given:  Yes    Orbie Pyo 03/16/2018, 3:29 PM

## 2018-03-16 NOTE — Progress Notes (Signed)
Pt c/o of the left arm feeling funny, said to be heavy, denies any tingling, arm movement and grip same on assessment, arm elevated on a pillow and pt encourage to exercise arm with the use of a washcloth for gripping, pt sat up for a few minutes and put back to bed, was however reassured, v/s stable. [place]-Asidi, [place] Qazi Efe

## 2018-03-16 NOTE — Progress Notes (Signed)
Inpatient Rehabilitation Admissions Coordinator  I met with patient, his wife, and daughter at bedside today. We discussed goals and expectations of an inpt rehab admission. They prefer an inpt rehab admit rather than SNF. I have begun authorization with [facility]. I will follow up tomorrow to admit pt pending insurance approval.  Danne Baxter, RN, MSN Rehab Admissions Coordinator [phone] 03/16/2018 6:12 PM

## 2018-03-17 ENCOUNTER — Inpatient Hospital Stay (HOSPITAL_COMMUNITY)
Admission: RE | Admit: 2018-03-17 | Discharge: 2018-04-15 | DRG: 057 | Disposition: A | Discharge status: Home Health | Payer: Medicare Other | Source: Intra-hospital | Attending: Physical Medicine & Rehabilitation | Admitting: Physical Medicine & Rehabilitation

## 2018-03-17 ENCOUNTER — Encounter (HOSPITAL_COMMUNITY): Payer: Self-pay

## 2018-03-17 ENCOUNTER — Other Ambulatory Visit: Payer: Self-pay

## 2018-03-17 ENCOUNTER — Other Ambulatory Visit: Payer: Self-pay | Admitting: Internal Medicine

## 2018-03-17 DIAGNOSIS — E1142 Type 2 diabetes mellitus with diabetic polyneuropathy: Secondary | ICD-10-CM | POA: Diagnosis present

## 2018-03-17 DIAGNOSIS — I69228 Other speech and language deficits following other nontraumatic intracranial hemorrhage: Secondary | ICD-10-CM

## 2018-03-17 DIAGNOSIS — I480 Paroxysmal atrial fibrillation: Secondary | ICD-10-CM | POA: Diagnosis not present

## 2018-03-17 DIAGNOSIS — Z7984 Long term (current) use of oral hypoglycemic drugs: Secondary | ICD-10-CM

## 2018-03-17 DIAGNOSIS — E785 Hyperlipidemia, unspecified: Secondary | ICD-10-CM | POA: Diagnosis present

## 2018-03-17 DIAGNOSIS — H3530 Unspecified macular degeneration: Secondary | ICD-10-CM | POA: Diagnosis present

## 2018-03-17 DIAGNOSIS — R4781 Slurred speech: Secondary | ICD-10-CM | POA: Diagnosis present

## 2018-03-17 DIAGNOSIS — I69154 Hemiplegia and hemiparesis following nontraumatic intracerebral hemorrhage affecting left non-dominant side: Secondary | ICD-10-CM | POA: Diagnosis present

## 2018-03-17 DIAGNOSIS — E1169 Type 2 diabetes mellitus with other specified complication: Secondary | ICD-10-CM | POA: Diagnosis not present

## 2018-03-17 DIAGNOSIS — Z806 Family history of leukemia: Secondary | ICD-10-CM

## 2018-03-17 DIAGNOSIS — I69354 Hemiplegia and hemiparesis following cerebral infarction affecting left non-dominant side: Secondary | ICD-10-CM | POA: Diagnosis not present

## 2018-03-17 DIAGNOSIS — E669 Obesity, unspecified: Secondary | ICD-10-CM | POA: Diagnosis present

## 2018-03-17 DIAGNOSIS — I610 Nontraumatic intracerebral hemorrhage in hemisphere, subcortical: Secondary | ICD-10-CM | POA: Diagnosis present

## 2018-03-17 DIAGNOSIS — Z8249 Family history of ischemic heart disease and other diseases of the circulatory system: Secondary | ICD-10-CM

## 2018-03-17 DIAGNOSIS — Z6831 Body mass index (BMI) 31.0-31.9, adult: Secondary | ICD-10-CM | POA: Diagnosis not present

## 2018-03-17 DIAGNOSIS — I10 Essential (primary) hypertension: Secondary | ICD-10-CM | POA: Diagnosis present

## 2018-03-17 DIAGNOSIS — R0989 Other specified symptoms and signs involving the circulatory and respiratory systems: Secondary | ICD-10-CM

## 2018-03-17 DIAGNOSIS — I69292 Facial weakness following other nontraumatic intracranial hemorrhage: Secondary | ICD-10-CM

## 2018-03-17 DIAGNOSIS — Z23 Encounter for immunization: Secondary | ICD-10-CM | POA: Diagnosis not present

## 2018-03-17 DIAGNOSIS — M792 Neuralgia and neuritis, unspecified: Secondary | ICD-10-CM

## 2018-03-17 DIAGNOSIS — I69254 Hemiplegia and hemiparesis following other nontraumatic intracranial hemorrhage affecting left non-dominant side: Secondary | ICD-10-CM | POA: Diagnosis not present

## 2018-03-17 DIAGNOSIS — Z841 Family history of disorders of kidney and ureter: Secondary | ICD-10-CM | POA: Diagnosis not present

## 2018-03-17 DIAGNOSIS — I69398 Other sequelae of cerebral infarction: Secondary | ICD-10-CM | POA: Diagnosis not present

## 2018-03-17 LAB — CBC
HCT: 43.9 % (ref 39.0–52.0)
Hemoglobin: 13.9 g/dL (ref 13.0–17.0)
MCH: 27.8 pg (ref 26.0–34.0)
MCHC: 31.7 g/dL (ref 30.0–36.0)
MCV: 87.8 fL (ref 78.0–100.0)
Platelets: 261 10*3/uL (ref 150–400)
RBC: 5 MIL/uL (ref 4.22–5.81)
RDW: 13.7 % (ref 11.5–15.5)
WBC: 8.8 10*3/uL (ref 4.0–10.5)

## 2018-03-17 LAB — BASIC METABOLIC PANEL
Anion gap: 11 (ref 5–15)
BUN: 18 mg/dL (ref 8–23)
CO2: 22 mmol/L (ref 22–32)
Calcium: 9.3 mg/dL (ref 8.9–10.3)
Chloride: 104 mmol/L (ref 98–111)
Creatinine, Ser: 0.9 mg/dL (ref 0.61–1.24)
GFR calc Af Amer: >60 mL/min (ref >60)
GFR calc non Af Amer: >60 mL/min (ref >60)
Glucose, Bld: 106 mg/dL — ABNORMAL HIGH (ref 70–99)
Potassium: 4.2 mmol/L (ref 3.5–5.1)
Sodium: 137 mmol/L (ref 135–145)

## 2018-03-17 LAB — GLUCOSE, CAPILLARY
Glucose-Capillary: 118 mg/dL — ABNORMAL HIGH (ref 70–99)
Glucose-Capillary: 125 mg/dL — ABNORMAL HIGH (ref 70–99)
Glucose-Capillary: 100 mg/dL — ABNORMAL HIGH (ref 70–99)
Glucose-Capillary: 98 mg/dL (ref 70–99)

## 2018-03-17 MED ORDER — ACETAMINOPHEN 325 MG PO TABS
325.0000 mg | ORAL_TABLET | ORAL | Status: DC | PRN
  Administered 2018-03-18: 650 mg via ORAL
  Administered 2018-03-18: 325 mg via ORAL
  Administered 2018-03-30 – 2018-03-31 (×2): 650 mg via ORAL
  Administered 2018-04-05: 325 mg via ORAL
  Administered 2018-04-13: 650 mg via ORAL
  Filled 2018-03-17: qty 2

## 2018-03-17 MED ORDER — INSULIN ASPART 100 UNIT/ML ~~LOC~~ SOLN: 0.0000 [IU] | Freq: Three times a day (TID) | SUBCUTANEOUS | 11 refills | Status: DC

## 2018-03-17 MED ORDER — INSULIN ASPART 100 UNIT/ML ~~LOC~~ SOLN: 0.0000 [IU] | Freq: Every day | SUBCUTANEOUS | 11 refills | Status: DC

## 2018-03-17 MED ORDER — TRAZODONE HCL 50 MG PO TABS
25.0000 mg | ORAL_TABLET | Freq: Every evening | ORAL | Status: DC | PRN
  Administered 2018-03-17 – 2018-04-13 (×26): 25 mg via ORAL
  Filled 2018-03-17 (×27): qty 1

## 2018-03-17 MED ORDER — METFORMIN HCL 850 MG PO TABS
850.0000 mg | ORAL_TABLET | Freq: Two times a day (BID) | ORAL | Status: DC
  Administered 2018-03-17 – 2018-04-15 (×58): 850 mg via ORAL
  Filled 2018-03-17 (×58): qty 1

## 2018-03-17 MED ORDER — HEPARIN SODIUM (PORCINE) 5000 UNIT/ML IJ SOLN
5000.0000 [IU] | Freq: Three times a day (TID) | INTRAMUSCULAR | Status: DC
  Administered 2018-03-17 – 2018-04-15 (×86): 5000 [IU] via SUBCUTANEOUS
  Filled 2018-03-17 (×86): qty 1

## 2018-03-17 MED ORDER — INSULIN ASPART 100 UNIT/ML ~~LOC~~ SOLN
0.0000 [IU] | Freq: Three times a day (TID) | SUBCUTANEOUS | Status: DC
  Administered 2018-03-28 – 2018-04-09 (×6): 2 [IU] via SUBCUTANEOUS

## 2018-03-17 MED ORDER — SENNOSIDES-DOCUSATE SODIUM 8.6-50 MG PO TABS
1.0000 | ORAL_TABLET | Freq: Two times a day (BID) | ORAL | Status: DC
  Administered 2018-03-17 – 2018-04-03 (×33): 1 via ORAL
  Filled 2018-03-17 (×34): qty 1

## 2018-03-17 MED ORDER — ZOLPIDEM TARTRATE 5 MG PO TABS: 5.0000 mg | ORAL_TABLET | Freq: Every evening | ORAL | 0 refills | Status: DC | PRN

## 2018-03-17 MED ORDER — ACETAMINOPHEN 325 MG PO TABS: 650.0000 mg | ORAL_TABLET | ORAL | 0 refills | Status: DC | PRN

## 2018-03-17 MED ORDER — ACETAMINOPHEN 325 MG PO TABS
650.0000 mg | ORAL_TABLET | ORAL | Status: DC | PRN
  Administered 2018-03-17 – 2018-04-08 (×27): 650 mg via ORAL
  Filled 2018-03-17 (×32): qty 2

## 2018-03-17 MED ORDER — HEPARIN SODIUM (PORCINE) 5000 UNIT/ML IJ SOLN: 5000.0000 [IU] | Freq: Three times a day (TID) | INTRAMUSCULAR | Status: DC

## 2018-03-17 MED ORDER — STROKE: EARLY STAGES OF RECOVERY BOOK: 1.0000 | Freq: Once | 0 refills | Status: DC

## 2018-03-17 MED ORDER — SENNOSIDES-DOCUSATE SODIUM 8.6-50 MG PO TABS: 1.0000 | ORAL_TABLET | Freq: Two times a day (BID) | ORAL | 0 refills | Status: DC

## 2018-03-17 MED ORDER — DILTIAZEM HCL ER COATED BEADS 180 MG PO CP24: 180.0000 mg | ORAL_CAPSULE | Freq: Every day | ORAL | 1 refills | Status: DC

## 2018-03-17 MED ORDER — PANTOPRAZOLE SODIUM 40 MG PO TBEC
40.0000 mg | DELAYED_RELEASE_TABLET | Freq: Every day | ORAL | Status: DC
  Administered 2018-03-18 – 2018-04-15 (×29): 40 mg via ORAL
  Filled 2018-03-17 (×29): qty 1

## 2018-03-17 MED ORDER — HEPARIN SODIUM (PORCINE) 5000 UNIT/ML IJ SOLN: 5000.0000 [IU] | Freq: Three times a day (TID) | INTRAMUSCULAR | 1 refills | Status: DC

## 2018-03-17 MED ORDER — METOPROLOL TARTRATE 50 MG PO TABS
50.0000 mg | ORAL_TABLET | Freq: Two times a day (BID) | ORAL | Status: DC
  Administered 2018-03-17 – 2018-04-15 (×57): 50 mg via ORAL
  Filled 2018-03-17 (×59): qty 1

## 2018-03-17 MED ORDER — DILTIAZEM HCL ER COATED BEADS 180 MG PO CP24
180.0000 mg | ORAL_CAPSULE | Freq: Every day | ORAL | Status: DC
  Administered 2018-03-18 – 2018-04-15 (×27): 180 mg via ORAL
  Filled 2018-03-17 (×29): qty 1

## 2018-03-17 MED ORDER — METOPROLOL TARTRATE 50 MG PO TABS: 50.0000 mg | ORAL_TABLET | Freq: Two times a day (BID) | ORAL | 1 refills | Status: DC

## 2018-03-17 NOTE — Progress Notes (Signed)
STROKE TEAM PROGRESS NOTE   SUBJECTIVE (INTERVAL HISTORY) His wife and dtr are at the bedside. Neuro stable, still has right hemiparesis, but starting to improve some. afib rate controlled. BP better. PT/OT recommend CIR. He has per-cert that has been started with insurance. I have d/w Case mgt.    OBJECTIVE Vitals:   03/16/18 2313 03/17/18 0320 03/17/18 0722 03/17/18 1212  BP: (!) 144/93 140/90 (!) 150/99 (!) 155/94  Pulse: 79 77 66 79  Resp: 20 18 16 20   Temp: 97.8 F (36.6 C) 98 F (36.7 C) 97.9 F (36.6 C) 98.7 F (37.1 C)  TempSrc: Oral Oral  Oral  SpO2: 98% 98% 100% 95%  Weight:      Height:        CBC:  Recent Labs  Lab 03/12/18 1119  03/16/18 0536 03/17/18 0237  WBC 6.1   < > 11.0* 8.8  NEUTROABS 3.1  --   --   --   HGB 13.8   < > 14.5 13.9  HCT 43.5   < > 45.7 43.9  MCV 86.8   < > 87.7 87.8  PLT 243   < > 271 261   < > = values in this interval not displayed.    Basic Metabolic Panel:  Recent Labs  Lab 03/16/18 0536 03/17/18 0237  NA 138 137  K 4.3 4.2  CL 103 104  CO2 23 22  GLUCOSE 123* 106*  BUN 18 18  CREATININE 0.97 0.90  CALCIUM 9.4 9.3    Lipid Panel:     Component Value Date/Time   CHOL 165 03/13/2018 0400   TRIG 153 (H) 03/13/2018 0400   HDL 40 (L) 03/13/2018 0400   CHOLHDL 4.1 03/13/2018 0400   VLDL 31 03/13/2018 0400   LDLCALC 94 03/13/2018 0400   HgbA1c:  Lab Results  Component Value Date   HGBA1C 6.4 (H) 03/13/2018   Urine Drug Screen:     Component Value Date/Time   LABOPIA NONE DETECTED 03/12/2018 1335   COCAINSCRNUR NONE DETECTED 03/12/2018 1335   LABBENZ NONE DETECTED 03/12/2018 1335   AMPHETMU NONE DETECTED 03/12/2018 1335   THCU NONE DETECTED 03/12/2018 1335   LABBARB NONE DETECTED 03/12/2018 1335    Alcohol Level     Component Value Date/Time   ETH <10 03/12/2018 1118    IMAGING  Mr [name] Head Wo Contrast  Result Date: 03/13/2018 CLINICAL DATA:  Intracranial hemorrhage, continued surveillance. LEFT  hemiparesis and hemisensory deficit. EXAM: MRI HEAD WITHOUT AND WITH CONTRAST MRA HEAD WITHOUT CONTRAST TECHNIQUE: Multiplanar, multiecho pulse sequences of the brain and surrounding structures were obtained without and with intravenous contrast. Angiographic images of the head were obtained using MRA technique without contrast. CONTRAST:  Gadavist, 7 mL. COMPARISON:  CT head 03/12/2018 FINDINGS: MRI HEAD FINDINGS Brain: 24 x 19 x 23 mm RIGHT thalamic hemorrhage, is redemonstrated, approximate 5 mL volume, essentially unchanged from priors. Mild surrounding edema. No abnormality on diffusion-weighted imaging outside of the area of the bleed. Mild atrophy. Mild subcortical and periventricular T2 and FLAIR hyperintensities, likely chronic microvascular ischemic change. Post infusion, no concerning areas of abnormal enhancement. Vascular: Flow voids are maintained. No areas of acute or chronic hemorrhage are identified elsewhere on susceptibility weighted imaging. Skull and upper cervical spine: Unremarkable skull base. Mild cervical spondylosis. Sinuses/Orbits: No significant paranasal sinus disease. Other: None. MRA HEAD FINDINGS Dominant LEFT internal carotid artery, but both are widely patent. No MCA proximal stenosis or branch occlusion. Hypoplastic RIGHT A1 ACA. Dominant  LEFT A1 ACA feeds both anterior cerebral is a. basilar artery widely patent with vertebrals codominant. No PCA stenosis or occlusion. No cerebellar branch occlusion. IMPRESSION: Approximate 5 mL RIGHT thalamic hemorrhage, mild surrounding edema. No abnormal postcontrast enhancement or diffusion abnormality outside of the area of the bleed. Mild surrounding edema without midline shift. 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 Electronically Signed    By: Staci Righter M.D.   On: 03/13/2018 18:14   Mr [name] BS Contrast  Result Date: 03/13/2018 CLINICAL DATA:  Intracranial hemorrhage, continued surveillance. LEFT hemiparesis and hemisensory deficit. EXAM: MRI HEAD WITHOUT AND WITH CONTRAST MRA HEAD WITHOUT CONTRAST TECHNIQUE: Multiplanar, multiecho pulse sequences of the brain and surrounding structures were obtained without and with intravenous contrast. Angiographic images of the head were obtained using MRA technique without contrast. CONTRAST:  Gadavist, 7 mL. COMPARISON:  CT head 03/12/2018 FINDINGS: MRI HEAD FINDINGS Brain: 24 x 19 x 23 mm RIGHT thalamic hemorrhage, is redemonstrated, approximate 5 mL volume, essentially unchanged from priors. Mild surrounding edema. No abnormality on diffusion-weighted imaging outside of the area of the bleed. Mild atrophy. Mild subcortical and periventricular T2 and FLAIR hyperintensities, likely chronic microvascular ischemic change. Post infusion, no concerning areas of abnormal enhancement. Vascular: Flow voids are maintained. No areas of acute or chronic hemorrhage are identified elsewhere on susceptibility weighted imaging. Skull and upper cervical spine: Unremarkable skull base. Mild cervical spondylosis. Sinuses/Orbits: No significant paranasal sinus disease. Other: None. MRA HEAD FINDINGS Dominant LEFT internal carotid artery, but both are widely patent. No MCA proximal stenosis or branch occlusion. Hypoplastic RIGHT A1 ACA. Dominant LEFT A1 ACA feeds both anterior cerebral is a. basilar artery widely patent with vertebrals codominant. No PCA stenosis or occlusion. No cerebellar branch occlusion. IMPRESSION: Approximate 5 mL RIGHT thalamic hemorrhage, mild surrounding edema. No abnormal postcontrast enhancement or diffusion abnormality outside of the area of the bleed. Mild surrounding edema without midline shift. 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 Electronically Signed   By: Staci Righter M.D.   On: 03/13/2018 18:14   Ct Head Code Stroke Wo Contrast  Result Date: 03/12/2018 CLINICAL DATA:  Code stroke. Left-sided facial droop. Left leg weakness. EXAM: CT HEAD WITHOUT CONTRAST TECHNIQUE: Contiguous axial images were obtained from the base of the skull through the vertex without intravenous contrast. COMPARISON:  None. FINDINGS: Brain: 1.5 x 2.8 x 1.9 cm high-density hematoma in the right thalamus and adjacent corona radiata. Thin rim of surrounding edema. No indication of underlying infarct or mass. No hydrocephalus or extra-axial collection. No ventricular or subarachnoid extension. Vascular: Mild arterial calcification. Skull: No acute finding. Sinuses/Orbits: Negative Other: Critical Value/emergent results were called by telephone at the time of interpretation on 03/12/2018 at 11:23 am to Dr. Rory Percy , who verbally acknowledged these results. ASPECTS [facility] Stroke Program Early CT Score) Not scored in the setting of acute hemorrhage. IMPRESSION: 4 cc acute hematoma in the right thalamus and corona radiata, often hypertensive in this location. Electronically Signed   By: Monte Fantasia M.D.   On: 03/12/2018 11:27    Transthoracic Echocardiogram Left ventricle: The cavity size was normal. Wall thickness was   increased in  a pattern of mild LVH. Systolic function was   vigorous. The estimated ejection fraction was in the range of 65%   to 70%. Wall motion was normal; there were no regional wall   motion abnormalities. - Aortic valve: There was trivial regurgitation.   Bilateral Carotid Dopplers  1-39% ICA stenosis bilaterally.   PHYSICAL EXAM Temp:  [97.8 F (36.6 C)-98.7 F (37.1 C)] 98.7 F (37.1 C) (09/25 1212) Pulse Rate:  [66-79] 79 (09/25 1212) Resp:  [16-20] 20 (09/25 1212) BP:  (138-155)/(90-99) 155/94 (09/25 1212) SpO2:  [95 %-100 %] 95 % (09/25 1212)  General - Well nourished, well developed, in no apparent distress.  Ophthalmologic - fundi not visualized due to noncooperation.  Cardiovascular - irregularly irregular heart rate and rhythm, rate better controlled now.  Mental Status -  A&Ox4 Language including expression, naming, repetition, comprehension was assessed and found intact. Fund of Knowledge was assessed and was intact.  Cranial Nerves II - XII - II - Visual field intact OU. III, IV, VI - Extraocular movements intact. V - Facial sensation intact bilaterally. VII - mild-mod left facial droop. VIII - Hearing & vestibular intact bilaterally. X - Palate elevates symmetrically. XI - Chin turning & shoulder shrug intact bilaterally, but less brisk on left. XII - Tongue protrusion intact.  Motor Strength - The patient's strength was normal in RUE and RLE, but LUE 2/5 proximal, 3/5 distal, LLE 3/5 proximal and distal, some anti-gravity in arm, but cannot hold for full 10ct.  Bulk was normal and fasciculations were absent.   Motor Tone - Muscle tone was assessed at the neck and appendages and was normal.  Reflexes - The patient's reflexes were symmetrical in all extremities and he had no pathological reflexes.  Sensory - Light touch, temperature/pinprick were assessed and were decreased on the left, LUE and LLE 50% comparing with right.    Coordination - The patient had normal movements in the right hand with no ataxia or dysmetria.  Tremor was absent.  Gait and Station - deferred.    ASSESSMENT/PLAN [name] is a 72 y.o. male with history of hyperlipidemia, hypertension, diabetes mellitus, and BPH presenting with facial numbness, slurred speech, leg weakness, and facial droop. He did not receive IV t-PA due to [place].   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  Resultant  Left hemiparesis, left hemianesthesia  CT  head - 4 cc acute hematoma in the right thalamus and corona radiata  MRI head - right thalamic ICH, stable in size, no abnormal enhancement  MRA head negative for aneurysm or AVM  Carotid Doppler unremarkable  2D Echo EF 65-70%  LDL - 94  HgbA1c - 6.4  UDS neg  VTE prophylaxis - heparin subq  Diet - Heart healthy / carb modified with thin liquids.  aspirin 81 mg daily prior to admission, now on No antithrombotic  Would wait on resuming anticoagulation till at least 1 mo f/u appt  Ongoing aggressive stroke risk factor management  Therapy recommendations: CIR  Disposition:  Pending pre-cert per Case mgt  Paroxysmal Afib with RVR  New onset  On metoprolol IV as needed  On Cardizem drip -transition to Cardizem CD 180 mg daily  Continue metoprolol 50 mg twice daily  cardiology consult appreciated  Hypertension  Stable off cleviprex . Systolic blood pressure goal < 160 mmHg . Home po meds - Ziac . Put on metoprolol and Cardizem . Long-term BP goal normotensive  Hyperlipidemia  Lipid lowering medication  PTA:  Lipitor 20 mg daily  LDL 94, goal < 70  Consider increasing Lipitor to 40 mg daily at time of discharge  Diabetes  HgbA1c 6.4, goal < 7.0  Controlled  On metformin  SSI  CBG monitoring  Other Stroke Risk Factors  Advanced age  ETOH use, advised to drink no more than 1 alcoholic beverage per day   Other Active Problems Awaiting insurance for d/c to [facility] day # [place], ARNP-C Stroke Neurology 03/17/2018 1:22 PM  To contact Stroke Continuity provider, please refer to [email or web address] After hours, contact General Neurology

## 2018-03-17 NOTE — Progress Notes (Signed)
  Speech Language Pathology Treatment: Cognitive-Linquistic  Patient Details Name: Marcus Beasley MRN: [id number] DOB: 18-Nov-1945 Today's Date: 03/17/2018 Time: 6060-0459 SLP Time Calculation (min) (ACUTE ONLY): 20 min  Assessment / Plan / Recommendation Clinical Impression  Pt was alert and engaged throughout cognitive linguistic treatment session today. Given min verbal cues, he recalled several details of his POC (discussed yesterday) and demonstrated eagerness to participate in continued rehab services. SLP engaged pt in a conversation regarding his plan/strategy to return to independent management of his medications upon d/c, and clinician taught patient effective organizational/memory strategies such as use of a pill box and timers. When presented with a hypothetical shopping activity, demonstrated improvement in basic functional problem solving skills (85% accuracy) given mod verbal and visual cues. More complex verbal problem solving continues to present challenges and anticipate magement of finances would require mod-max assist. ST will continue to follow in acute setting to provide cognitive-linguistic treatment focused on memory and problem solving to promote safety with ADLs.       HPI HPI: 72 y.o. male admitted with facial numbness, slurred speech, leg weakness, and facial droop with right thalamic and corona radiata ICH. PMH: macular degeneration, HLD, HTN, DM and BPH.      SLP Plan  Continue with current plan of care       Recommendations                   General recommendations: Rehab consult Oral Care Recommendations: Oral care BID Follow up Recommendations: Inpatient Rehab Plan: Continue with current plan of care       Jettie Booze, Student SLP                 Jettie Booze 03/17/2018, 1:15 PM

## 2018-03-17 NOTE — Progress Notes (Signed)
Jamse Arn, MD    Jamse Arn, MD  Physician  Physical Medicine and Rehabilitation      Consult Note  Signed     Date of Service:  03/15/2018 12:12 PM         Related encounter: ED to Hosp-Admission (Discharged) from 03/12/2018 in [facility]             Signed          Expand All Collapse All            Expand widget buttonCollapse widget button    Show:Clear all   ManualTemplateCopied  Added by:     Angiulli, Lavon Paganini, PA-C  Jamse Arn, MD   Hover for detailscustomization button                                                                                                                                                              untitled image              Physical Medicine and Rehabilitation Consult  Reason for Consult: Left-sided weakness and slurred speech  Referring Physician: Dr.Xu        HPI: MILOS MILLIGAN is a 72 y.o. right-handed male with history of diabetes mellitus hypertension, hyperlipidemia.  Per chart review, patient, and family, patient lives with spouse.  Independent prior to admission and retired.  Multilevel home with 6 steps to entry.  Wife can assist as needed.  2 daughters in the area work.  Presented 03/12/2018 with left side weakness and slurred speech with facial numbness.Marland Kitchen  MRI reviewed, showing right thalamic hemorrhage. Per report, 24 x 19 x 23 mm right thalamic hemorrhage.  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  Echocardiogram with ejection fraction of 70% no wall motion abnormalities.  During admission patient developed PAF with RVR and received IV Cardizem.  Cardiology services consulted and currently  maintained with rate controlled on metoprolol as well as Cardizem.  Subcutaneous heparin later initiated for DVT prophylaxis 03/14/2018.  Tolerating a regular diet.  Therapy evaluations completed with recommendations of physical medicine rehab consult.     Review of Systems   Constitutional: Negative for chills and fever.   HENT: Negative for hearing loss.    Eyes: Negative for blurred vision and double vision.   Respiratory: Negative for cough and shortness of breath.    Cardiovascular: Negative for chest pain, palpitations and leg swelling.   Gastrointestinal: Positive for constipation. Negative for nausea and vomiting.   Genitourinary: Positive for urgency. Negative for dysuria, flank pain and hematuria.   Musculoskeletal: Positive for myalgias.  Skin: Negative for rash.   Neurological: Positive for dizziness, speech change, focal weakness and headaches. Negative for sensory change.   All other systems reviewed and are negative.          Past Medical History:    Diagnosis   Date    .   BPH (benign prostatic hyperplasia)            (-) Bx 2015    .   Diabetes mellitus without complication ([place])        .   Elevated PSA            Prostate Bx in 06/2013 was benign    .   HTN (hypertension)        .   Hyperlipidemia        .   Macular degeneration, age related                 Past Surgical History:    Procedure   Laterality   Date    .   ABCESS DRAINAGE                abdomen- 26 day hospitalization 1968    .   COLONOSCOPY       2016    .   HERNIA REPAIR       summer '11        umbilical, dr Ninfa Linden     .   POLYPECTOMY            .   PROSTATE BIOPSY       06-2013 , 07-2017        (-), (-)             Family History    Problem   Relation   Age of Onset    .   Leukemia   Mother        .   Heart attack   Father                 MI age 27    .   Heart disease   Sister                age 29    .   Cancer - Other   Sister                type    .   Heart disease   Brother                age 27    .   Kidney disease   Brother                HD    .   Diabetes   Maternal Grandmother        .   Prostate cancer   Neg Hx        .   Colon cancer   Neg Hx        .   Esophageal cancer   Neg Hx        .   Rectal cancer   Neg Hx        .   Stomach cancer   Neg Hx           Social History:  reports that he has never smoked. He has never used smokeless tobacco. He reports that he drinks alcohol. He reports that he does not use drugs.  Allergies: No Known Allergies  Facility-Administered Medications Prior to Admission    Medication   Dose   Route   Frequency   Provider   Last Rate   Last Dose    .   0.9 %  sodium chloride infusion    500 mL   Intravenous   Once   Pyrtle, Lajuan Lines, MD                      Medications Prior to Admission    Medication   Sig   Dispense   Refill    .   aspirin EC 81 MG tablet   Take 81 mg by mouth daily.            Marland Kitchen   atorvastatin (LIPITOR) 20 MG tablet   Take 1 tablet (20 mg total) by mouth daily.   90 tablet   1    .   bisoprolol-hydrochlorothiazide (ZIAC) 5-6.25 MG tablet   Take 1 tablet by mouth daily.   90 tablet   1    .   Camphor-Eucalyptus-Menthol (VICKS VAPORUB EX)   Apply 1 application topically at bedtime as needed (congestion).            Marland Kitchen   ibuprofen (ADVIL,MOTRIN) 200 MG tablet   Take 400 mg by mouth every 6 (six) hours as needed for headache (pain).            .   meclizine (ANTIVERT) 25 MG tablet   Take 1 tablet (25 mg total) by mouth as needed for dizziness. (Patient taking differently: Take 25 mg by mouth daily as needed for dizziness. )   30 tablet   1    .    metFORMIN (GLUCOPHAGE) 850 MG tablet   Take 1 tablet (850 mg total) by mouth 2 (two) times daily with a meal.   180 tablet   1    .   Multiple Vitamin (MULTIVITAMIN WITH MINERALS) TABS tablet   Take 1 tablet by mouth daily. Centrum            .   Multiple Vitamins-Minerals (EYE VITAMINS PO)   Take 1 tablet by mouth daily.            .   Polyvinyl Alcohol-Povidone (REFRESH OP)   Place 1 drop into both eyes 2 (two) times daily as needed (dry eyes).            .   sildenafil (VIAGRA) 100 MG tablet   Take 100 mg by mouth daily as needed for erectile dysfunction. Reported on 12/14/2015            .   Lancets (ONETOUCH ULTRASOFT) lancets   Check blood sugar no more than twice daily.   100 each   12    .   ONETOUCH VERIO test strip   CHECK BLOOD SUGAR NO MORE THAN TWICE DAILY   200 each   12          Home:  Home Living  Family/patient expects to be discharged to:: Private residence  Living Arrangements: Spouse/significant other  Available Help at Discharge: Family, Friend(s)  Type of Home: House  Home Access: Stairs to enter  Technical brewer of Steps: 6  Entrance Stairs-Rails: Can reach both  Home Layout: Multi-level  Alternate Level Stairs-Number of Steps: 15  Alternate Level Stairs-Rails: Right  Bathroom Shower/Tub: Tourist information centre manager: Handicapped height  Bathroom Accessibility: Yes  Home Equipment: None   Lives With: Spouse  Functional History:  Prior Function  Level of Independence: Independent  Comments: retired from [employer] in maintenance, driving   Functional Status:   Mobility:  Bed Mobility  Overal bed mobility: Needs Assistance  Bed Mobility: Sit to Supine, Supine to Sit  Rolling: Mod assist  Sidelying to sit: Max assist  Supine to sit: Mod assist  Sit to supine: Mod assist  General bed mobility comments: max verbal cues for sequencing and control of  movement.  mod - max facilitation of Lt UE and LE to assist   Transfers  Overall transfer level: Needs assistance  Equipment used: 2 person hand held assist  Transfers: Sit to/from Stand, Stand Pivot Transfers  Sit to Stand: Mod assist, +2 physical assistance  Stand pivot transfers: Max assist, +2 physical assistance  General transfer comment: Did not attempt with +1 due to safety concerns and pushing.  Did work on sit to partial stand, keeping pt in flexed position to avoid full pushing - he required max A   Ambulation/Gait  General Gait Details: unable to assess at this time.        ADL:  ADL  Overall ADL's : Needs assistance/impaired  Eating/Feeding: Set up, Bed level  Grooming: Wash/dry hands, Wash/dry face, Oral care, Set up, Bed level  Upper Body Bathing: Moderate assistance, Bed level, Sitting  Lower Body Bathing: Maximal assistance, Bed level  Upper Body Dressing : Maximal assistance, Bed level, Sitting  Lower Body Dressing: Total assistance, Bed level  Toilet Transfer: Total assistance  Toilet Transfer Details (indicate cue type and reason): unable to safely attempt with +1 assist   Toileting- Clothing Manipulation and Hygiene: Total assistance, Bed level  Functional mobility during ADLs: Maximal assistance     Cognition:  Cognition  Overall Cognitive Status: Impaired/Different from baseline  Arousal/Alertness: Awake/alert  Orientation Level: Oriented X4  Attention: Sustained  Sustained Attention: Appears intact  Memory: Impaired  Memory Impairment: Storage deficit, Retrieval deficit  Awareness: Impaired  Awareness Impairment: Anticipatory impairment  Problem Solving: (suspect difficulty with complex)  Safety/Judgment: Appears intact  Cognition  Arousal/Alertness: Awake/alert  Behavior During Therapy: WFL for tasks assessed/performed  Overall Cognitive Status: Impaired/Different from baseline  Area of Impairment:  Attention, Following commands, Awareness, Problem solving  Current Attention Level: Selective  Memory: Decreased short-term memory  Following Commands: Follows one step commands consistently, Follows multi-step commands inconsistently  Safety/Judgement: Decreased awareness of deficits, Decreased awareness of safety  Awareness: Intellectual  Problem Solving: Difficulty sequencing, Requires verbal cues, Requires tactile cues  General Comments: decreased awareness of deficits and strong postural lean, able to correct 50% of the time with verbal cues, responds well to tactile cues on Rt side, decreased to absent sensation on the letft.      Blood pressure (!) 142/97, pulse 75, temperature (!) 97.5 F (36.4 C), resp. rate (!) 21, height 5\' 11"  (1.803 m), weight 84.7 kg, SpO2 94 %.  Physical Exam   Vitals reviewed.  Constitutional: He is oriented to person, place, and time. He appears well-developed.   HENT:   Head: Normocephalic and atraumatic.  Poor dentition   Eyes: EOM are normal. Right eye exhibits no discharge. Left eye exhibits no discharge.  Pupils reactive to light   Neck: Normal range of motion. Neck supple. No thyromegaly present.   Cardiovascular: Normal rate and regular rhythm.   Respiratory: Effort normal and breath sounds normal. No respiratory distress.   GI: Soft. Bowel sounds are normal. He exhibits no distension.  Musculoskeletal:  No edema or  tenderness in extremities  Neurological: He is alert and oriented to person, place, and time.  Follows basic commands.   Fair awareness of deficits. Left facial droop Motor: LUE/LLE: 5/5 proximal to distal LUE: Shoulder abduction, elbow flex/ext 1/5, hand grip 3+/5 LLE: HF, KE 3-/5, ADF 2/5   Skin: Skin is warm and dry.  Psychiatric: He has a normal mood and affect. His behavior is normal. Thought content normal.         Lab Results Last 24 Hours  Imaging Results (Last 48 hours)                              Assessment/Plan:  Diagnosis: Right thalamic hemorrhage  Labs and images (see above) independently reviewed.  Records reviewed and summated above.  Stroke:  Continue secondary stroke prophylaxis and Risk Factor Modification listed below:    Blood Pressure Management:  Continue current medication with prn's with permisive HTN per primary team  Diabetes management:    Left sided hemiparesis: fit for orthosis to prevent contractures (resting hand splint for day, wrist cock up splint at night, PRAFO, etc)  Motor recovery: Fluoxetine     1.Does the need for close, 24 hr/day medical supervision in concert with the patient's rehab needs make it unreasonable for this patient to be served in a less intensive setting? Yes    2.Co-Morbidities requiring supervision/potential complications: diabetes mellitus (Monitor in accordance with exercise and adjust meds as necessary), HTN (monitor and provide prns in accordance with increased physical exertion and pain), hyperlipidemia (cont  meds), PAF (cont meds, monitor HR with increased mobility)   2.Due to bladder management, safety, disease management and patient education, does the patient require 24 hr/day rehab nursing? Yes   3.Does the patient require coordinated care of a physician, rehab nurse, PT (1-2 hrs/day, 5 days/week) and OT (1-2 hrs/day, 5 days/week) to address physical and functional deficits in the context of the above medical diagnosis(es)? Yes Addressing deficits in the following areas: balance, endurance, locomotion, strength, transferring, bowel/bladder control, bathing, dressing, toileting and psychosocial support   4.Can the patient actively participate in an intensive therapy program of at least 3 hrs of therapy per day at least 5 days per week? Yes   5.The potential for patient to make measurable gains while on inpatient rehab is excellent   6.Anticipated functional outcomes upon discharge from inpatient rehab are supervision and min assist  with PT, supervision and min assist with OT, n/a with SLP.   7.Estimated rehab length of stay to reach the above functional goals is: 16-18 days.   8.Anticipated D/C setting: Home   9.Anticipated post D/C treatments: HH therapy and Home excercise program   10.Overall Rehab/Functional Prognosis: good      RECOMMENDATIONS:  This patient's condition is appropriate for continued rehabilitative care in the following setting: CIR  Patient has agreed to participate in recommended program. Yes  Note that insurance prior authorization may be required for reimbursement for recommended care.     Comment: Rehab Admissions Coordinator to follow up.        I have personally performed a face to face diagnostic evaluation, including, but not limited to relevant history and physical exam findings, of this patient and developed relevant assessment and plan.  Additionally, I have reviewed and concur with the physician assistant's documentation above.       Delice Lesch, MD, ABPMR  Lavon Paganini Angiulli, PA-C  03/15/2018                Revision History                                        Routing History

## 2018-03-17 NOTE — IPOC Note (Signed)
Overall Plan of Care [facility]) Patient Details Name: Marcus Beasley MRN: [id number] DOB: 1946-01-24  Admitting Diagnosis: Right thalamic hemorrhage  Hospital Problems: Active Problems:   Thalamic hemorrhage ([place])   Diabetes mellitus type 2 in obese [facility])   Hemiparesis affecting left side as late effect of stroke [facility])     Functional Problem List: Nursing Bladder, Bowel, Endurance, Medication Management, Motor, Pain, Safety, Perception, Sensory  PT Balance, Motor, Endurance, Perception, Safety, Behavior, Sensory  OT Balance, Safety, Motor, Endurance, Cognition  SLP Cognition  TR         Basic ADL's: OT Eating, Grooming, Bathing, Dressing, Toileting     Advanced  ADL's: OT       Transfers: PT Bed to Chair, Furniture, Bed Mobility, Car  OT Toilet(Tub/shower on second unit- will revisit closer to d/c)     Locomotion: PT Ambulation, Stairs, Wheelchair Mobility     Additional Impairments: OT Fuctional Use of Upper Extremity  SLP Social Cognition   Problem Solving, Memory, Attention, Awareness  TR      Anticipated Outcomes Item Anticipated Outcome  Self Feeding mod I   Swallowing      Basic self-care  supervision   Toileting  supervision    Bathroom Transfers supervision   Bowel/Bladder  decrease urgency, maintain continence. Regularly empty bowel and bladder  Transfers  Supervision  Locomotion  minA  Communication     Cognition  mod I  Pain  Less than 4  Safety/Judgment  Remain free of falls, infection and skin breakdowm   Therapy Plan: PT Intensity: Minimum of 1-2 x/day ,45 to 90 minutes PT Frequency: 5 out of 7 days PT Duration Estimated Length of Stay: 3-3.5 weeks OT Intensity: Minimum of 1-2 x/day, 45 to 90 minutes OT Duration/Estimated Length of Stay: 21-24 days  SLP Intensity: Minumum of 1-2 x/day, 30 to 90 minutes SLP Frequency: 3 to 5 out of 7 days SLP Duration/Estimated Length of Stay: 21-25 days     Team Interventions: Nursing  Interventions Patient/Family Education, Bladder Management, Bowel Management, Pain Management, Discharge Planning, Psychosocial Support  PT interventions Ambulation/gait training, Training and development officer, Community reintegration, Discharge planning, DME/adaptive equipment instruction, Functional mobility training, Neuromuscular re-education, Patient/family education, Stair training, Therapeutic Activities, Therapeutic Exercise, UE/LE Coordination activities, Wheelchair propulsion/positioning, UE/LE Strength taining/ROM, Visual/perceptual remediation/compensation, Splinting/orthotics, Psychosocial support, Disease management/prevention, Cognitive remediation/compensation  OT Interventions Balance/vestibular training, Discharge planning, Functional electrical stimulation, Self Care/advanced ADL retraining, Therapeutic Activities, UE/LE Coordination activities, Cognitive remediation/compensation, Disease mangement/prevention, Functional mobility training, Patient/family education, Skin care/wound managment, Therapeutic Exercise, Neuromuscular re-education, Psychosocial support, Splinting/orthotics, UE/LE Strength taining/ROM, Wheelchair propulsion/positioning, DME/adaptive equipment instruction, Community reintegration  SLP Interventions Cognitive remediation/compensation, English as a second language teacher, Environmental controls, Internal/external aids, Patient/family education  TR Interventions    SW/CM Interventions Discharge Planning, Psychosocial Support, Patient/Family Education   Barriers to Discharge MD  Medical stability  Nursing      PT Home environment access/layout, Inaccessible home environment 5-6 stairs to enter w/ 1 handrail and 15 to second level w/ 1 handrail; shower upstairs  OT Home environment access/layout Pt's home has 3 levels with full bathroom available on 2nd floor- 15 stairs to reach   SLP      SW       Team Discharge Planning: Destination: PT-Home ,OT- Home , SLP-Home Projected  Follow-up: PT-Home health PT, 24 hour supervision/assistance, OT-  Home health OT, SLP-Home Health SLP, 24 hour supervision/assistance Projected Equipment Needs: PT-To be determined, OT- To be determined, SLP-None recommended by SLP Equipment Details: PT- , OT-  Patient/family involved in discharge planning: PT- Patient, Family member/caregiver,  OT-Patient, Family member/caregiver, SLP-Patient  MD ELOS: 15-18 days. Medical Rehab Prognosis:  Good Assessment: 72 year old right-handed male history of diabetes mellitus, hypertension, hyperlipidemia.  Presented 03/12/2018 with left-sided weakness and slurred speech with facial numbness.  Blood pressure 168/98. MRI reviewed, showing right thalamic hemorrhage.  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  Carotid Dopplers with no ICA stenosis.  Echocardiogram with ejection fraction of 70% no wall motion abnormalities.  During admission patient developed PAF with RVR received intravenous Cardizem.  Cardiology services consulted and currently maintained with rate control on metoprolol as well as Cardizem.  Subcutaneous heparin for DVT prophylaxis initiated 03/14/2018.  Tolerating a regular diet.  Patient with resulting functional deficits with mobility, transfers, self-care. Will set goals for Supervision with PT/OT and Mod I with SLP.  See Team Conference Notes for weekly updates to the plan of care

## 2018-03-17 NOTE — Progress Notes (Signed)
Pt d/c to Rehab. Pt is alert and oriented, no new concerns, pt is stable and on room air. Hand over report called into [facility] RN

## 2018-03-17 NOTE — Discharge Instructions (Signed)
Will eventually need to be started on anticoagulation for Afib, but not until out pt follow up. Will be ok to restart ASA only at time of rehab d/c

## 2018-03-17 NOTE — Progress Notes (Signed)
Inpatient Rehabilitation Admissions Coordinator  I have insurance approval and bed to admit pt to inpt rehab today. I met with pt's wife and daughter and they are in agreement. I notified Dr. Erlinda Hong, Staff RN, RN CM and SW. I will make the arrangements to admit today.  Danne Baxter, RN, MSN Rehab Admissions Coordinator [phone] 03/17/2018 12:44 PM

## 2018-03-17 NOTE — H&P (Addendum)
Physical Medicine and Rehabilitation Admission H&P    Chief Complaint  Patient presents with  . Code Stroke  : HPI: Marcus Beasley is a 72 year old right-handed male history of diabetes mellitus, hypertension, hyperlipidemia.  Per chart review, patient and family, patient lives with spouse.  Independent prior to admission and retired.  Multilevel home with 6 steps to entry.  Wife can assist as needed.  2 daughters in the area work.  Presented 03/12/2018 with left-sided weakness and slurred speech with facial numbness.  Blood pressure 168/98. MRI reviewed, showing right thalamic hemorrhage.  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  Carotid Dopplers with no ICA stenosis.  Echocardiogram with ejection fraction of 70% no wall motion abnormalities.  During admission patient developed PAF with RVR received intravenous Cardizem.  Cardiology services consulted and currently maintained with rate control on metoprolol as well as Cardizem.  Subcutaneous heparin for DVT prophylaxis initiated 03/14/2018.  Tolerating a regular diet.  Therapy evaluations completed with recommendations of physical medicine rehab consult.  Patient was admitted for a comprehensive rehab program.  Review of Systems  Constitutional: Negative for chills and fever.  HENT: Negative for hearing loss.   Eyes: Negative for blurred vision and double vision.  Respiratory: Negative for cough and shortness of breath.   Cardiovascular: Negative for chest pain, palpitations and leg swelling.  Gastrointestinal: Positive for constipation. Negative for nausea.  Genitourinary: Positive for urgency.  Musculoskeletal: Positive for myalgias.  Skin: Negative for rash.  Neurological: Positive for dizziness, speech change, focal weakness and headaches. Negative for sensory change. All other  systems reviewed and are negative.  Past Medical History:  Diagnosis Date  . BPH (benign prostatic hyperplasia)    (-) Bx 2015  . Diabetes mellitus without complication ([place])   . Elevated PSA    Prostate Bx in 06/2013 was benign  . HTN (hypertension)   . Hyperlipidemia   . Macular degeneration, age related    Past Surgical History:  Procedure Laterality Date  . ABCESS DRAINAGE     abdomen- 26 day hospitalization 1968  . COLONOSCOPY  2016  . HERNIA REPAIR  summer '11   umbilical, dr Ninfa Linden   . POLYPECTOMY    . PROSTATE BIOPSY  06-2013 , 07-2017   (-), (-)   Family History  Problem Relation Age of Onset  . Leukemia Mother   . Heart attack Father        MI age 32  . Heart disease Sister        age 35  . Cancer - Other Sister        type  . Heart disease Brother        age 42  . Kidney disease Brother        HD  . Diabetes Maternal Grandmother   . Prostate cancer Neg Hx   . Colon cancer Neg Hx   . Esophageal cancer Neg Hx   . Rectal cancer Neg Hx   . Stomach cancer Neg Hx    Social History:  reports that he has never smoked. He has never used smokeless tobacco. He reports that he drinks alcohol. He reports that he does not use drugs. Allergies: No Known Allergies Medications Prior to Admission  Medication Sig Dispense Refill  .  stroke: mapping our early stages of recovery book MISC  1 each by Does not apply route once for 1 dose. 1 each 0  . acetaminophen (TYLENOL) 325 MG tablet Take 2 tablets (650 mg total) by mouth every 4 (four) hours as needed for mild pain (or temp > 37.5 C (99.5 F)). 30 tablet 0  . atorvastatin (LIPITOR) 20 MG tablet Take 1 tablet (20 mg total) by mouth daily. 90 tablet 1  . [START ON 03/18/2018] diltiazem (CARDIZEM CD) 180 MG 24 hr capsule Take 1 capsule (180 mg total) by mouth daily. 30 capsule 1  . heparin 5000 UNIT/ML injection Inject 1 mL (5,000 Units total) into the skin every 8 (eight) hours. 1 mL 1  . insulin aspart (NOVOLOG) 100 UNIT/ML  injection Inject 0-15 Units into the skin 3 (three) times daily with meals. 10 mL 11  . insulin aspart (NOVOLOG) 100 UNIT/ML injection Inject 0-5 Units into the skin at bedtime. 10 mL 11  . meclizine (ANTIVERT) 25 MG tablet Take 1 tablet (25 mg total) by mouth as needed for dizziness. (Patient taking differently: Take 25 mg by mouth daily as needed for dizziness. ) 30 tablet 1  . metFORMIN (GLUCOPHAGE) 850 MG tablet Take 1 tablet (850 mg total) by mouth 2 (two) times daily with a meal. 180 tablet 1  . metoprolol tartrate (LOPRESSOR) 50 MG tablet Take 1 tablet (50 mg total) by mouth 2 (two) times daily. 30 tablet 1  . Multiple Vitamin (MULTIVITAMIN WITH MINERALS) TABS tablet Take 1 tablet by mouth daily. Centrum    . senna-docusate (SENOKOT-S) 8.6-50 MG tablet Take 1 tablet by mouth 2 (two) times daily. 30 tablet 0  . zolpidem (AMBIEN) 5 MG tablet Take 1 tablet (5 mg total) by mouth at bedtime as needed for sleep. 30 tablet 0    Drug Regimen Review Drug regimen was reviewed and remains appropriate with no significant issues identified  Home: Home Living Family/patient expects to be discharged to:: Private residence Living Arrangements: Spouse/significant other Available Help at Discharge: Family, Friend(s), Available 24 hours/day Type of Home: House Home Access: Stairs to enter Technical brewer of Steps: 6 Entrance Stairs-Rails: Can reach both Home Layout: Multi-level Alternate Level Stairs-Number of Steps: 15 Alternate Level Stairs-Rails: Right Bathroom Shower/Tub: Multimedia programmer: Handicapped height Bathroom Accessibility: Yes Home Equipment: None  Lives With: Spouse   Functional History: Prior Function Level of Independence: Independent Comments: 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, driving   Functional Status:  Mobility: Bed Mobility Overal bed mobility: Needs Assistance Bed Mobility: Rolling, Sidelying to Sit Rolling: Mod assist Sidelying to sit:  Mod assist Supine to sit: Mod assist Sit to supine: Mod assist General bed mobility comments: cues for technique and assist to reach to rail and bring L leg off bed and for trunk righting Transfers Overall transfer level: Needs assistance Equipment used: 2 person hand held assist Transfers: Sit to/from Stand Sit to Stand: Mod assist, +2 physical assistance Stand pivot transfers: Max assist, +2 physical assistance General transfer comment: up from elevated surface +2 A for R weight shift, L foot placement and safety Ambulation/Gait Ambulation/Gait assistance: +2 physical assistance, Max assist Gait Distance (Feet): 2 Feet Assistive device: 2 person hand held assist General Gait Details: side steps toward [facility] with assist to move and place L LE and to block in stance, assist for R lateral lean and cues for "shimmy" steps with R foot towards L    ADL: ADL Overall ADL's : Needs assistance/impaired Eating/Feeding: Set up, Bed level Grooming: Wash/dry hands, Wash/dry face, Oral  care, Set up, Bed level Upper Body Bathing: Moderate assistance, Bed level, Sitting Lower Body Bathing: Maximal assistance, Bed level Upper Body Dressing : Maximal assistance, Bed level, Sitting Lower Body Dressing: Total assistance, Bed level Toilet Transfer: Total assistance Toilet Transfer Details (indicate cue type and reason): unable to safely attempt with +1 assist  Toileting- Clothing Manipulation and Hygiene: Total assistance, Bed level Functional mobility during ADLs: Maximal assistance  Cognition: Cognition Overall Cognitive Status: Impaired/Different from baseline Arousal/Alertness: Awake/alert Orientation Level: Oriented X4 Attention: Sustained Sustained Attention: Appears intact Memory: Impaired Memory Impairment: Storage deficit, Retrieval deficit Awareness: Impaired Awareness Impairment: Anticipatory impairment Problem Solving: (suspect difficulty with complex) Safety/Judgment: Appears  intact Cognition Arousal/Alertness: Awake/alert Behavior During Therapy: WFL for tasks assessed/performed Overall Cognitive Status: Impaired/Different from baseline Area of Impairment: Attention, Following commands, Awareness, Problem solving Current Attention Level: Selective Memory: Decreased short-term memory Following Commands: Follows one step commands with increased time Safety/Judgement: Decreased awareness of deficits, Decreased awareness of safety Awareness: Intellectual Problem Solving: Difficulty sequencing, Requires verbal cues, Requires tactile cues General Comments: decreased awareness of deficits and strong postural lean, able to correct 50% of the time with verbal cues, responds well to tactile cues on Rt side, decreased to absent sensation on the letft.   Physical Exam: Blood pressure (!) 155/94, pulse 79, temperature 98.7 F (37.1 C), temperature source Oral, resp. rate 20, height '5\' 11"'$  (1.803 m), weight 84.7 kg, SpO2 95 %. Physical Exam  Vitals reviewed. NAD. Constitutional: He is oriented to person, place, and time. He appears well-developed and well-nourished.  HENT:  Head: Normocephalic and atraumatic.  Poor dentition  Eyes: EOM are normal. Right eye exhibits no discharge. Left eye exhibits no discharge.  Neck: Normal range of motion. Neck supple. No thyromegaly present.  Cardiovascular: Normal rate and regular rhythm. No JVD. Respiratory: Effort normal and breath sounds normal. No respiratory distress.  GI: Soft. Bowel sounds are normal. He exhibits no distension.  Musculoskeletal:  No edema or tenderness in extremities  Neurological: He is alert and oriented to person, place, and time.  Follows basic commands  Motor: RUE/RLE: 5/5 proximal to distal LUE: Shoulder abduction, elbow flex/ext 1+/5, hand grip 3+/5 LLE: HF< KE 3/5, ADF 2/5 Left facial droop Skin: Skin is warm and dry.  Psychiatric: He has a normal mood and affect. His behavior is normal. Thought  content normal.    Results for orders placed or performed during the hospital encounter of 03/12/18 (from the past 48 hour(s))  Glucose, capillary     Status: Abnormal   Collection Time: 03/15/18  9:33 PM  Result Value Ref Range   Glucose-Capillary 100 (H) 70 - 99 mg/dL  CBC     Status: Abnormal   Collection Time: 03/16/18  5:36 AM  Result Value Ref Range   WBC 11.0 (H) 4.0 - 10.5 K/uL   RBC 5.21 4.22 - 5.81 MIL/uL   Hemoglobin 14.5 13.0 - 17.0 g/dL   HCT 45.7 39.0 - 52.0 %   MCV 87.7 78.0 - 100.0 fL   MCH 27.8 26.0 - 34.0 pg   MCHC 31.7 30.0 - 36.0 g/dL   RDW 13.7 11.5 - 15.5 %   Platelets 271 150 - 400 K/uL    Comment: Performed at [facility], [place] [address] [place], [place] [address]  Basic metabolic panel     Status: Abnormal   Collection Time: 03/16/18  5:36 AM  Result Value Ref Range   Sodium 138 135 - 145 mmol/L  Potassium 4.3 3.5 - 5.1 mmol/L   Chloride 103 98 - 111 mmol/L   CO2 23 22 - 32 mmol/L   Glucose, Bld 123 (H) 70 - 99 mg/dL   BUN 18 8 - 23 mg/dL   Creatinine, Ser 0.97 0.61 - 1.24 mg/dL   Calcium 9.4 8.9 - 10.3 mg/dL   GFR calc non Af Amer >60 >60 mL/min   GFR calc Af Amer >60 >60 mL/min    Comment: (NOTE) The eGFR has been calculated using the CKD EPI equation. This calculation has not been validated in all clinical situations. eGFR's persistently <60 mL/min signify possible Chronic Kidney Disease.    Anion gap 12 5 - 15    Comment: Performed at [facility] [address] [place], [place] [address]  Glucose, capillary     Status: Abnormal   Collection Time: 03/16/18  6:34 AM  Result Value Ref Range   Glucose-Capillary 107 (H) 70 - 99 mg/dL   Comment 1 Notify RN    Comment 2 Document in Chart   Glucose, capillary     Status: Abnormal   Collection Time: 03/16/18 11:27 AM  Result Value Ref Range   Glucose-Capillary 108 (H) 70 - 99 mg/dL   Comment 1 Notify RN    Comment 2 Document in Chart   Glucose, capillary     Status: Abnormal     Collection Time: 03/16/18  4:38 PM  Result Value Ref Range   Glucose-Capillary 110 (H) 70 - 99 mg/dL  Glucose, capillary     Status: Abnormal   Collection Time: 03/16/18  9:18 PM  Result Value Ref Range   Glucose-Capillary 101 (H) 70 - 99 mg/dL   Comment 1 Notify RN    Comment 2 Document in Chart   CBC     Status: None   Collection Time: 03/17/18  2:37 AM  Result Value Ref Range   WBC 8.8 4.0 - 10.5 K/uL   RBC 5.00 4.22 - 5.81 MIL/uL   Hemoglobin 13.9 13.0 - 17.0 g/dL   HCT 43.9 39.0 - 52.0 %   MCV 87.8 78.0 - 100.0 fL   MCH 27.8 26.0 - 34.0 pg   MCHC 31.7 30.0 - 36.0 g/dL   RDW 13.7 11.5 - 15.5 %   Platelets 261 150 - 400 K/uL    Comment: Performed at [facility], [place]. [address], [place], [place] [address]  Basic metabolic panel     Status: Abnormal   Collection Time: 03/17/18  2:37 AM  Result Value Ref Range   Sodium 137 135 - 145 mmol/L   Potassium 4.2 3.5 - 5.1 mmol/L   Chloride 104 98 - 111 mmol/L   CO2 22 22 - 32 mmol/L   Glucose, Bld 106 (H) 70 - 99 mg/dL   BUN 18 8 - 23 mg/dL   Creatinine, Ser 0.90 0.61 - 1.24 mg/dL   Calcium 9.3 8.9 - 10.3 mg/dL   GFR calc non Af Amer >60 >60 mL/min   GFR calc Af Amer >60 >60 mL/min    Comment: (NOTE) The eGFR has been calculated using the CKD EPI equation. This calculation has not been validated in all clinical situations. eGFR's persistently <60 mL/min signify possible Chronic Kidney Disease.    Anion gap 11 5 - 15    Comment: Performed at [facility] [address], [place], [place] [address]  Glucose, capillary     Status: Abnormal   Collection Time: 03/17/18  6:03 AM  Result Value Ref Range   Glucose-Capillary 118 (H) 70 - 99 mg/dL   Comment 1 Notify RN    Comment 2 Document in Chart   Glucose, capillary     Status: Abnormal   Collection Time: 03/17/18 12:10 PM  Result Value Ref Range   Glucose-Capillary 125 (H) 70 - 99 mg/dL   No results found.  Medical Problem List and Plan: 1.  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  DVT Prophylaxis/Anticoagulation: Subcutaneous heparin initiated 03/14/2018.  Monitor for any bleeding episodes 3. Pain Management: Tylenol as needed 4. Mood: Provide emotional support 5. Neuropsych: This patient is capable of making decisions on his own behalf. 6. Skin/Wound Care: Routine skin checks 7. Fluids/Electrolytes/Nutrition: Routine in and outs with follow-up chemistries 8.  PAF with RVR.  Cardizem CD 180 mg daily, Lopressor 50 mg twice daily.  Cardiac rate controlled.  Follow-up cardiology services 9.  Diabetes mellitus with peripheral neuropathy.  Hemoglobin A1c 6.4.  Glucophage 850 mg twice daily.  Check blood sugars before meals and at bedtime. 10.  Hyperlipidemia.  Patient on Lipitor prior to admission   Post Admission Physician Evaluation: 1. Preadmission assessment reviewed and changes made below. 2. Functional deficits secondary  to right thalamic hemorrhage. 3. Patient is admitted to receive collaborative, interdisciplinary care between the physiatrist, rehab nursing staff, and therapy team. 4. Patient's level of medical complexity and substantial therapy needs in context of that medical necessity cannot be provided at a lesser intensity of care such as a SNF. 5. Patient has experienced substantial functional loss from his/her baseline which was documented above under the "Functional History" and "Functional Status" headings.  Judging by the patient's diagnosis, physical exam, and functional history, the patient has potential for functional progress which will result in measurable gains while on inpatient rehab.  These gains will be of substantial and practical use upon discharge  in facilitating mobility and self-care at the household level. 27. Physiatrist will provide 24 hour management of medical needs as well as oversight of the therapy plan/treatment and provide guidance as appropriate  regarding the interaction of the two. 7. 24 hour rehab nursing will assist with bladder management, safety, disease management and patient education  and help integrate therapy concepts, techniques,education, etc. 8. PT will assess and treat for/with: Lower extremity strength, range of motion, stamina, balance, functional mobility, safety, adaptive techniques and equipment, wound care, coping skills, pain control, education. Goals are: Min A/Supervision. 9. OT will assess and treat for/with: ADL's, functional mobility, safety, upper extremity strength, adaptive techniques and equipment, wound mgt, ego support, and community reintegration.   Goals are: Min A/Supervision. Therapy may proceed with showering this patient. 10. Case Management and Social Worker will assess and treat for psychological issues and discharge planning. 11. Team conference will be held weekly to assess progress toward goals and to determine barriers to discharge. 12. Patient will receive at least 3 hours of therapy per day at least 5 days per week. 13. ELOS: 13-17 days.       14. Prognosis:  good  I have personally performed a face to face diagnostic evaluation, including, but not limited to relevant history and physical exam findings, of this patient and developed relevant assessment and plan.  Additionally, I have reviewed and concur with the physician assistant's documentation above.  The patient's status has not changed. The original post admission physician evaluation remains appropriate, and any changes from the pre-admission screening or documentation  from the acute chart are noted above.    Delice Lesch, MD, ABPMR Lavon Paganini Angiulli, PA-C 03/17/2018

## 2018-03-17 NOTE — Progress Notes (Signed)
Physical Therapy Treatment Patient Details Name: Marcus Beasley MRN: [id number] DOB: 1945/10/17 Today's Date: 03/17/2018    History of Present Illness 72 y.o. male admitted with facial numbness, slurred speech, leg weakness, and facial droop with right thalamic and corona radiata ICH. PMhx: macular degeneration, HLD, HTN, DM and BPH     PT Comments    Patient progressing this session with standing balance and able to take some side steps towards [facility] with +2 A.  Remains limited but postural awareness, pushing into hemiplegic side and will need comprehensive inpatient rehab for interdisciplinary skilled therapies to address deficits and maximize mobility and independence prior to d/c home.   Follow Up Recommendations  CIR;Supervision for mobility/OOB     Equipment Recommendations  Other (comment);3in1 (PT);Wheelchair (measurements PT)    Recommendations for Other Services       Precautions / Restrictions Precautions Precautions: Fall Precaution Comments: significant L lateral lean in standing    Mobility  Bed Mobility Overal bed mobility: Needs Assistance   Rolling: Mod assist Sidelying to sit: Mod assist       General bed mobility comments: cues for technique and assist to reach to rail and bring L leg off bed and for trunk righting  Transfers Overall transfer level: Needs assistance Equipment used: 2 person hand held assist Transfers: Sit to/from Stand Sit to Stand: Mod assist;+2 physical assistance         General transfer comment: up from elevated surface +2 A for R weight shift, L foot placement and safety  Ambulation/Gait Ambulation/Gait assistance: +2 physical assistance;Max assist Gait Distance (Feet): 2 Feet Assistive device: 2 person hand held assist       General Gait Details: side steps toward [facility] with assist to move and place L LE and to block in stance, assist for R lateral lean and cues for "shimmy" steps with R foot towards L   Stairs              Wheelchair Mobility    Modified Rankin (Stroke Patients Only) Modified Rankin (Stroke Patients Only) Pre-Morbid Rankin Score: No symptoms Modified Rankin: Severe disability     Balance Overall balance assessment: Needs assistance   Sitting balance-Leahy Scale: Fair Sitting balance - Comments: sitting EOB initially holding footboard with R UE, then with hands in lap able to look over both shoulders and down without LOB   Standing balance support: Bilateral upper extremity supported;Single extremity supported Standing balance-Leahy Scale: Poor Standing balance comment: +2 A for standing due to L lateral lean and non support with L LE.  Assist for R lateral lean with mirror for feedback and pt able to perform some lateral weight shifts with assist and cues                            Cognition Arousal/Alertness: Awake/alert Behavior During Therapy: WFL for tasks assessed/performed Overall Cognitive Status: Impaired/Different from baseline Area of Impairment: Attention;Following commands;Awareness;Problem solving                   Current Attention Level: Selective Memory: Decreased short-term memory Following Commands: Follows one step commands with increased time Safety/Judgement: Decreased awareness of deficits;Decreased awareness of safety   Problem Solving: Difficulty sequencing;Requires verbal cues;Requires tactile cues        Exercises      General Comments General comments (skin integrity, edema, etc.): wife and daughter present, RN reports to transport to rehab this pm  Pertinent Vitals/Pain Pain Assessment: Faces Faces Pain Scale: Hurts little more Pain Location: L shoulder  Pain Descriptors / Indicators: Aching Pain Intervention(s): Repositioned;Monitored during session    Home Living     Available Help at Discharge: Family;Friend(s);Available 24 hours/day                Prior Function            PT Goals  (current goals can now be found in the care plan section) Progress towards PT goals: Progressing toward goals    Frequency    Min 4X/week      PT Plan Current plan remains appropriate    Co-evaluation              AM-PAC PT "6 Clicks" Daily Activity  Outcome Measure  Difficulty turning over in bed (including adjusting bedclothes, sheets and blankets)?: Unable Difficulty moving from lying on back to sitting on the side of the bed? : Unable Difficulty sitting down on and standing up from a chair with arms (e.g., wheelchair, bedside commode, etc,.)?: Unable Help needed moving to and from a bed to chair (including a wheelchair)?: Total Help needed walking in hospital room?: Total Help needed climbing 3-5 steps with a railing? : Total 6 Click Score: 6    End of Session Equipment Utilized During Treatment: Gait belt Activity Tolerance: Patient tolerated treatment well Patient left: with call bell/phone within reach;in bed;with family/visitor present;with bed alarm set   PT Visit Diagnosis: Other abnormalities of gait and mobility (R26.89);Other symptoms and signs involving the nervous system (R29.898);Hemiplegia and hemiparesis Hemiplegia - Right/Left: Left Hemiplegia - dominant/non-dominant: Non-dominant Hemiplegia - caused by: Nontraumatic intracerebral hemorrhage     Time: 1423-1450 PT Time Calculation (min) (ACUTE ONLY): 27 min  Charges:  $Therapeutic Activity: 23-37 mins                     Magda Kiel, [place] [facility] [phone] 03/17/2018    Reginia Naas 03/17/2018, 4:38 PM

## 2018-03-17 NOTE — Progress Notes (Signed)
Marcus Gong, RN  Rehab Admission Coordinator  Physical Medicine and Rehabilitation  PMR Pre-admission  Signed  Date of Service:  03/17/2018 1:33 PM       Related encounter: ED to Hosp-Admission (Discharged) from 03/12/2018 in [facility]      Signed         Show:Clear all [x] Manual[x] Template[x] Copied  Added by: [x] Marcus Gong, RN  [] Hover for details PMR Admission Coordinator Pre-Admission Assessment  Patient: Marcus Beasley is an 72 y.o., male MRN: [id number] DOB: [birth year] Height: 5\' 11"  (180.3 cm) Weight: 84.7 kg                                                                                                                                                  Insurance Information HMO: yes    PPO:      PCP:      IPA:      80/20:      OTHER: medicare advantage plan PRIMARY: [employer] Medicare      Policy#: [id number]      Subscriber: pt CM Name: Anderson Malta      Phone#: [phone]     Fax#: [phone] Pre-Cert#: [id number] approved for 7 days with f/u with Vevelyn Royals phone [phone] fax [phone]      Employer: retired Benefits:  Phone #: [phone]     Name: 9/24 Eff. Date: 07/24/2017     Deduct: none      Out of Pocket Max: $4400      Life Max: none CIR: $345 co pay per day days 1 until 5      SNF: no co pay days 1 until 20; #160 cop ay per day days 21 until 48; no co pay days 49 until 100 Outpatient: $40 co pay per visit     Co-Pay: visits per medical neccesity Home Health: 100%      Co-Pay: visits per medical neccesity DME: 80%     Co-Pay: 20% Providers: in network  SECONDARY: none        Medicaid Application Date:       Case Manager:  Disability Application Date:       Case Worker:   Emergency Publishing copy Information    Name Relation Home Work Mobile   [place] Spouse [phone]       Current Medical History  Patient Admitting Diagnosis: right thalamic  hemorrhage  History of Present Illness:Marcus Beasley is a 72 year old right-handed male history of diabetes mellitus, hypertension, hyperlipidemia. Presented 03/12/2018 with left-sided weakness and slurred speech with facial numbness. Blood pressure 168/98 MRI reviewed, showing right thalamic hemorrhage. 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 Carotid Dopplers with no ICA stenosis. Echocardiogram with ejection fraction of 70% no wall motion abnormalities. During admission patient developed PAF with RVR received intravenous Cardizem. Cardiology services consulted and currently maintained with rate control on metoprolol as well as Cardizem. Subcutaneous heparin for DVT prophylaxis initiated 03/14/2018. Tolerating a regular diet.   Complete NIHSS TOTAL: 6  Past Medical History      Past Medical History:  Diagnosis Date  . BPH (benign prostatic hyperplasia)    (-) Bx 2015  . Diabetes mellitus without complication ([place])   . Elevated PSA    Prostate Bx in 06/2013 was benign  . HTN (hypertension)   . Hyperlipidemia   . Macular degeneration, age related     Family History  family history includes Cancer - Other in his sister; Diabetes in his maternal grandmother; Heart attack in his father; Heart disease in his brother and sister; Kidney disease in his brother; Leukemia in his mother.  Prior Rehab/Hospitalizations:  Has the patient had major surgery during 100 days prior to admission? No  Current Medications   Current Facility-Administered Medications:  .   stroke: mapping our early stages of recovery book, , Does not apply, Once, Vonzella Nipple, NP .  acetaminophen (TYLENOL) tablet 650 mg, 650 mg, Oral, Q4H PRN, 650 mg at 03/17/18 1044 **OR** [DISCONTINUED] acetaminophen (TYLENOL) solution 650 mg, 650 mg,  Per Tube, Q4H PRN **OR** [DISCONTINUED] acetaminophen (TYLENOL) suppository 650 mg, 650 mg, Rectal, Q4H PRN, Vonzella Nipple, NP .  diltiazem (CARDIZEM CD) 24 hr capsule 180 mg, 180 mg, Oral, Daily, Rosalin Hawking, MD, 180 mg at 03/17/18 1040 .  heparin injection 5,000 Units, 5,000 Units, Subcutaneous, Q8H, Rosalin Hawking, MD, 5,000 Units at 03/17/18 0537 .  insulin aspart (novoLOG) injection 0-15 Units, 0-15 Units, Subcutaneous, TID WC, Vonzella Nipple, NP, 2 Units at 03/15/18 0825 .  insulin aspart (novoLOG) injection 0-5 Units, 0-5 Units, Subcutaneous, QHS, Williams, Jessica N, NP .  metFORMIN (GLUCOPHAGE) tablet 850 mg, 850 mg, Oral, BID WC, Rosalin Hawking, MD, 850 mg at 03/17/18 0857 .  metoprolol tartrate (LOPRESSOR) tablet 50 mg, 50 mg, Oral, BID, Rosalin Hawking, MD, 50 mg at 03/17/18 1040 .  pantoprazole (PROTONIX) EC tablet 40 mg, 40 mg, Oral, Daily, Rosalin Hawking, MD, 40 mg at 03/17/18 1040 .  senna-docusate (Senokot-S) tablet 1 tablet, 1 tablet, Oral, BID, Vonzella Nipple, NP, 1 tablet at 03/17/18 1040 .  zolpidem (AMBIEN) tablet 5 mg, 5 mg, Oral, QHS PRN, Rosalin Hawking, MD, 5 mg at 03/16/18 2128  Patients Current Diet:     Diet Order                  Diet heart healthy/carb modified Room service appropriate? Yes; Fluid consistency: Thin  Diet effective now               Precautions / Restrictions Precautions Precautions: Fall Precaution Comments: significant L lateral lean in standing Restrictions Weight Bearing Restrictions: No   Has the patient had 2 or more falls or a fall with injury in the past year?No  Prior Activity Level Community (5-7x/wk): independent, active, driving, very Manufacturing engineer / [place] Devices/Equipment: None Home Equipment: None  Prior Device Use: Indicate devices/aids used by the patient prior to current illness, exacerbation or injury? None of the above  Prior Functional Level Prior  Function Level of Independence: Independent Comments: retired from [employer] in maintenance, driving   [place]: Did the  patient need help bathing, dressing, using the toilet or eating?  Independent  Indoor Mobility: Did the patient need assistance with walking from room to room (with or without device)? Independent  Stairs: Did the patient need assistance with internal or external stairs (with or without device)? Independent  Functional Cognition: Did the patient need help planning regular tasks such as shopping or remembering to take medications? Independent  Current Functional Level Cognition  Arousal/Alertness: Awake/alert Overall Cognitive Status: Impaired/Different from baseline Current Attention Level: Selective Orientation Level: Oriented X4 Following Commands: Follows one step commands with increased time Safety/Judgement: Decreased awareness of deficits, Decreased awareness of safety General Comments: decreased awareness of deficits and strong postural lean, able to correct 50% of the time with verbal cues, responds well to tactile cues on Rt side, decreased to absent sensation on the letft.  Attention: Sustained Sustained Attention: Appears intact Memory: Impaired Memory Impairment: Storage deficit, Retrieval deficit Awareness: Impaired Awareness Impairment: Anticipatory impairment Problem Solving: (suspect difficulty with complex) Safety/Judgment: Appears intact    Extremity Assessment (includes Sensation/Coordination)  Upper Extremity Assessment: LUE deficits/detail LUE Deficits / Details: He demonstrates Lt UE movement in [place] stage 2, hand in stage 4 (mass grasp/release).  He is able to initiate shoulder flexion with mod facilitation  LUE Sensation: decreased light touch  Lower Extremity Assessment: Defer to PT evaluation LLE Deficits / Details: limited mobility, strength: hip ADD/ABD/flex/ext 2/5, knee extension/flexion 3/5,  dorsiflexion/plantarflexion 2/5  LLE Sensation: decreased light touch(sensation sparing in medial thigh, absent on remainder of LLE ) LLE Coordination: decreased fine motor, decreased gross motor    ADLs  Overall ADL's : Needs assistance/impaired Eating/Feeding: Set up, Bed level Grooming: Wash/dry hands, Wash/dry face, Oral care, Set up, Bed level Upper Body Bathing: Moderate assistance, Bed level, Sitting Lower Body Bathing: Maximal assistance, Bed level Upper Body Dressing : Maximal assistance, Bed level, Sitting Lower Body Dressing: Total assistance, Bed level Toilet Transfer: Total assistance Toilet Transfer Details (indicate cue type and reason): unable to safely attempt with +1 assist  Toileting- Clothing Manipulation and Hygiene: Total assistance, Bed level Functional mobility during ADLs: Maximal assistance    Mobility  Overal bed mobility: Needs Assistance Bed Mobility: Rolling, Sidelying to Sit Rolling: Mod assist Sidelying to sit: Mod assist Supine to sit: Mod assist Sit to supine: Mod assist General bed mobility comments: cues for technique and assist to reach to rail and bring L leg off bed and for trunk righting    Transfers  Overall transfer level: Needs assistance Equipment used: Rolling walker (2 wheeled) Transfers: Sit to/from Stand, Stand Pivot Transfers Sit to Stand: Mod assist, +2 physical assistance Stand pivot transfers: Max assist, +2 physical assistance General transfer comment: stood from EOB with assist but able to power up with LE's and R UE.  Standing able to take step uncoordinated with L LE, but max cues and facilitation for R lateral lean due to heavy L lateral lean.  Able to step around with L but not move R LE except to pivot wtih cues, brought chair up to patient and assist to sit.     Ambulation / Gait / Stairs / Wheelchair Mobility  Ambulation/Gait General Gait Details: unable to assess at this time.     Posture / Balance Dynamic  Sitting Balance Sitting balance - Comments: sitting EOB with R hand holding footboard with S level A.  Over time able to take R hand off footboard and sit with S no UE support. Balance Overall balance assessment: Needs assistance Sitting-balance  support: Feet supported, No upper extremity supported Sitting balance-Leahy Scale: Poor Sitting balance - Comments: sitting EOB with R hand holding footboard with S level A.  Over time able to take R hand off footboard and sit with S no UE support. Postural control: Left lateral lean Standing balance support: Bilateral upper extremity supported, Single extremity supported Standing balance-Leahy Scale: Zero Standing balance comment: leaning L and difficulty with postural orientation as more assist equals more pushing to L.  moving R LE to R more when attempting to adjust posture and to pivot to chair    Special needs/care consideration BiPAP/CPAP n/a CPM n/a Continuous Drip IV n/a Dialysis n/a Life Vest n/a Oxygen n/a Special Bed n/a Trach Size n/a Wound Vac n/a  Skin intact Bowel mgmt: no LBM documented Bladder mgmt: incontinent, external catheter Diabetic mgmt Hgb A1c 6.4 Wife would like to limit visitors for he is very social and becoming very tired    Previous Environmental health practitioner Living Arrangements: Spouse/significant other  Lives With: Spouse Available Help at Discharge: Family, Friend(s), Available 24 hours/day Type of Home: House Home Layout: Multi-level Alternate Level Stairs-Rails: Right Alternate Level Stairs-Number of Steps: 15 Home Access: Stairs to enter Entrance Stairs-Rails: Can reach both Entrance Stairs-Number of Steps: 6 Bathroom Shower/Tub: Multimedia programmer: Handicapped height Bathroom Accessibility: Yes Home Care Services: No  Discharge Living Setting Plans for Discharge Living Setting: Patient's home, Lives with (comment)(spouse) Type of Home at Discharge: House Discharge Home Layout:  Multi-level Alternate Level Stairs-Rails: Right Alternate Level Stairs-Number of Steps: 15 Discharge Home Access: Stairs to enter Entrance Stairs-Rails: Right, Left, Can reach both Entrance Stairs-Number of Steps: 6 Discharge Bathroom Shower/Tub: Walk-in shower Discharge Bathroom Toilet: Handicapped height Discharge Bathroom Accessibility: Yes How Accessible: Accessible via walker Does the patient have any problems obtaining your medications?: No  Social/Family/Support Systems Patient Roles: Spouse, Parent Contact Information: Pamala Hurry, wife Anticipated Caregiver: wife  and family Anticipated Caregiver's Contact Information: see above Ability/Limitations of Caregiver: no limitations Caregiver Availability: 24/7 Discharge Plan Discussed with Primary Caregiver: Yes Is Caregiver In Agreement with Plan?: Yes Does Caregiver/Family have Issues with Lodging/Transportation while Pt is in Rehab?: No(wife has been staying with pt 24/7)  Goals/Additional Needs Patient/Family Goal for Rehab: supervision to min assist with PT and OT, supervision with SLP Expected length of stay: [facility] 16 to 18 days Special Service Needs: patient restless and with confusion at night since admit per wife, not pta baseline Pt/Family Agrees to Admission and willing to participate: Yes Program Orientation Provided & Reviewed with Pt/Caregiver Including Roles  & Responsibilities: Yes  Decrease burden of Care through IP rehab admission: n/a  Possible need for SNF placement upon discharge: not anticipated  Patient Condition: This patient's condition remains as documented in the consult dated 03/15/2018, in which the Rehabilitation Physician determined and documented that the patient's condition is appropriate for intensive rehabilitative care in an inpatient rehabilitation facility. Will admit to inpatient rehab today.  Preadmission Screen Completed By:  Cleatrice Burke, 03/17/2018 1:33  PM ______________________________________________________________________   Discussed status with Dr. Posey Pronto on 03/17/2018 at  1343 and received telephone approval for admission today.  Admission Coordinator:  Cleatrice Burke, time 0998 Date 03/17/2018           Cosigned by: Jamse Arn, MD at 03/17/2018 1:51 PM  Revision History

## 2018-03-17 NOTE — PMR Pre-admission (Signed)
PMR Admission Coordinator Pre-Admission Assessment  Patient: Marcus Beasley is an 72 y.o., male MRN: [id number] DOB: [birth year] Height: 5\' 11"  (180.3 cm) Weight: 84.7 kg              Insurance Information HMO: yes    PPO:      PCP:      IPA:      80/20:      OTHER: medicare advantage plan PRIMARY: [place] [employer]      Policy#: [id number]      Subscriber: pt CM Name: Anderson Malta      Phone#: [phone]     Fax#: [phone] Pre-Cert#: [id number] approved for 7 days with f/u with Vevelyn Royals phone [phone] fax [phone]      Employer: retired Benefits:  Phone #: [phone]     Name: 9/24 Eff. Date: 07/24/2017     Deduct: none      Out of Pocket Max: $4400      Life Max: none CIR: $345 co pay per day days 1 until 5      SNF: no co pay days 1 until 20; #160 cop ay per day days 21 until 48; no co pay days 49 until 100 Outpatient: $40 co pay per visit     Co-Pay: visits per medical neccesity Home Health: 100%      Co-Pay: visits per medical neccesity DME: 80%     Co-Pay: 20% Providers: in network  SECONDARY: none        Medicaid Application Date:       Case Manager:  Disability Application Date:       Case Worker:   Emergency Facilities manager Information    Name Relation Home Work Mobile   [place] Spouse [phone]       Current Medical History  Patient Admitting Diagnosis: right thalamic hemorrhage  History of Present Illness:Marcus Beasley is a 72 year old right-handed male history of diabetes mellitus, hypertension, hyperlipidemia.  Presented 03/12/2018 with left-sided weakness and slurred speech with facial numbness.  Blood pressure 168/98 MRI reviewed, showing right thalamic hemorrhage.  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  Carotid Dopplers with no ICA stenosis.  Echocardiogram with  ejection fraction of 70% no wall motion abnormalities.  During admission patient developed PAF with RVR received intravenous Cardizem.  Cardiology services consulted and currently maintained with rate control on metoprolol as well as Cardizem.  Subcutaneous heparin for DVT prophylaxis initiated 03/14/2018.  Tolerating a regular diet.    Complete NIHSS TOTAL: 6    Past Medical History  Past Medical History:  Diagnosis Date  . BPH (benign prostatic hyperplasia)    (-) Bx 2015  . Diabetes mellitus without complication ([place])   . Elevated PSA    Prostate Bx in 06/2013 was benign  . HTN (hypertension)   . Hyperlipidemia   . Macular degeneration, age related     Family History  family history includes Cancer - Other in his sister; Diabetes in his maternal grandmother; Heart attack in his father; Heart disease in his brother and sister; Kidney disease in his brother; Leukemia in his mother.  Prior Rehab/Hospitalizations:  Has the patient had major surgery during 100 days prior to admission? No  Current Medications   Current Facility-Administered Medications:  .   stroke: mapping our early stages of recovery book, , Does  not apply, Once, Vonzella Nipple, NP .  acetaminophen (TYLENOL) tablet 650 mg, 650 mg, Oral, Q4H PRN, 650 mg at 03/17/18 1044 **OR** [DISCONTINUED] acetaminophen (TYLENOL) solution 650 mg, 650 mg, Per Tube, Q4H PRN **OR** [DISCONTINUED] acetaminophen (TYLENOL) suppository 650 mg, 650 mg, Rectal, Q4H PRN, Vonzella Nipple, NP .  diltiazem (CARDIZEM CD) 24 hr capsule 180 mg, 180 mg, Oral, Daily, Rosalin Hawking, MD, 180 mg at 03/17/18 1040 .  heparin injection 5,000 Units, 5,000 Units, Subcutaneous, Q8H, Rosalin Hawking, MD, 5,000 Units at 03/17/18 0537 .  insulin aspart (novoLOG) injection 0-15 Units, 0-15 Units, Subcutaneous, TID WC, Vonzella Nipple, NP, 2 Units at 03/15/18 0825 .  insulin aspart (novoLOG) injection 0-5 Units, 0-5 Units, Subcutaneous, QHS, Williams, Jessica  N, NP .  metFORMIN (GLUCOPHAGE) tablet 850 mg, 850 mg, Oral, BID WC, Rosalin Hawking, MD, 850 mg at 03/17/18 0857 .  metoprolol tartrate (LOPRESSOR) tablet 50 mg, 50 mg, Oral, BID, Rosalin Hawking, MD, 50 mg at 03/17/18 1040 .  pantoprazole (PROTONIX) EC tablet 40 mg, 40 mg, Oral, Daily, Rosalin Hawking, MD, 40 mg at 03/17/18 1040 .  senna-docusate (Senokot-S) tablet 1 tablet, 1 tablet, Oral, BID, Vonzella Nipple, NP, 1 tablet at 03/17/18 1040 .  zolpidem (AMBIEN) tablet 5 mg, 5 mg, Oral, QHS PRN, Rosalin Hawking, MD, 5 mg at 03/16/18 2128  Patients Current Diet:  Diet Order            Diet heart healthy/carb modified Room service appropriate? Yes; Fluid consistency: Thin  Diet effective now              Precautions / Restrictions Precautions Precautions: Fall Precaution Comments: significant L lateral lean in standing Restrictions Weight Bearing Restrictions: No   Has the patient had 2 or more falls or a fall with injury in the past year?No  Prior Activity Level Community (5-7x/wk): independent, active, driving, very Manufacturing engineer / [place] Devices/Equipment: None Home Equipment: None  Prior Device Use: Indicate devices/aids used by the patient prior to current illness, exacerbation or injury? None of the above  Prior Functional Level Prior Function Level of Independence: Independent Comments: retired from [employer] in maintenance, driving   [place]: Did the patient need help bathing, dressing, using the toilet or eating?  Independent  Indoor Mobility: Did the patient need assistance with walking from room to room (with or without device)? Independent  Stairs: Did the patient need assistance with internal or external stairs (with or without device)? Independent  Functional Cognition: Did the patient need help planning regular tasks such as shopping or remembering to take medications? Independent  Current Functional Level Cognition   Arousal/Alertness: Awake/alert Overall Cognitive Status: Impaired/Different from baseline Current Attention Level: Selective Orientation Level: Oriented X4 Following Commands: Follows one step commands with increased time Safety/Judgement: Decreased awareness of deficits, Decreased awareness of safety General Comments: decreased awareness of deficits and strong postural lean, able to correct 50% of the time with verbal cues, responds well to tactile cues on Rt side, decreased to absent sensation on the letft.  Attention: Sustained Sustained Attention: Appears intact Memory: Impaired Memory Impairment: Storage deficit, Retrieval deficit Awareness: Impaired Awareness Impairment: Anticipatory impairment Problem Solving: (suspect difficulty with complex) Safety/Judgment: Appears intact    Extremity Assessment (includes Sensation/Coordination)  Upper Extremity Assessment: LUE deficits/detail LUE Deficits / Details: He demonstrates Lt UE movement in [place] stage 2, hand in stage 4 (mass grasp/release).  He is able to initiate shoulder flexion with mod  facilitation  LUE Sensation: decreased light touch  Lower Extremity Assessment: Defer to PT evaluation LLE Deficits / Details: limited mobility, strength: hip ADD/ABD/flex/ext 2/5, knee extension/flexion 3/5, dorsiflexion/plantarflexion 2/5  LLE Sensation: decreased light touch(sensation sparing in medial thigh, absent on remainder of LLE ) LLE Coordination: decreased fine motor, decreased gross motor    ADLs  Overall ADL's : Needs assistance/impaired Eating/Feeding: Set up, Bed level Grooming: Wash/dry hands, Wash/dry face, Oral care, Set up, Bed level Upper Body Bathing: Moderate assistance, Bed level, Sitting Lower Body Bathing: Maximal assistance, Bed level Upper Body Dressing : Maximal assistance, Bed level, Sitting Lower Body Dressing: Total assistance, Bed level Toilet Transfer: Total assistance Toilet Transfer Details (indicate  cue type and reason): unable to safely attempt with +1 assist  Toileting- Clothing Manipulation and Hygiene: Total assistance, Bed level Functional mobility during ADLs: Maximal assistance    Mobility  Overal bed mobility: Needs Assistance Bed Mobility: Rolling, Sidelying to Sit Rolling: Mod assist Sidelying to sit: Mod assist Supine to sit: Mod assist Sit to supine: Mod assist General bed mobility comments: cues for technique and assist to reach to rail and bring L leg off bed and for trunk righting    Transfers  Overall transfer level: Needs assistance Equipment used: Rolling walker (2 wheeled) Transfers: Sit to/from Stand, Stand Pivot Transfers Sit to Stand: Mod assist, +2 physical assistance Stand pivot transfers: Max assist, +2 physical assistance General transfer comment: stood from EOB with assist but able to power up with LE's and R UE.  Standing able to take step uncoordinated with L LE, but max cues and facilitation for R lateral lean due to heavy L lateral lean.  Able to step around with L but not move R LE except to pivot wtih cues, brought chair up to patient and assist to sit.     Ambulation / Gait / Stairs / Wheelchair Mobility  Ambulation/Gait General Gait Details: unable to assess at this time.     Posture / Balance Dynamic Sitting Balance Sitting balance - Comments: sitting EOB with R hand holding footboard with S level A.  Over time able to take R hand off footboard and sit with S no UE support. Balance Overall balance assessment: Needs assistance Sitting-balance support: Feet supported, No upper extremity supported Sitting balance-Leahy Scale: Poor Sitting balance - Comments: sitting EOB with R hand holding footboard with S level A.  Over time able to take R hand off footboard and sit with S no UE support. Postural control: Left lateral lean Standing balance support: Bilateral upper extremity supported, Single extremity supported Standing balance-Leahy Scale:  Zero Standing balance comment: leaning L and difficulty with postural orientation as more assist equals more pushing to L.  moving R LE to R more when attempting to adjust posture and to pivot to chair    Special needs/care consideration BiPAP/CPAP n/a CPM n/a Continuous Drip IV n/a Dialysis n/a Life Vest n/a Oxygen n/a Special Bed n/a Trach Size n/a Wound Vac n/a  Skin intact Bowel mgmt: no LBM documented Bladder mgmt: incontinent, external catheter Diabetic mgmt Hgb A1c 6.4 Wife would like to limit visitors for he is very social and becoming very tired    Previous Environmental health practitioner Living Arrangements: Spouse/significant other  Lives With: Spouse Available Help at Discharge: Family, Friend(s), Available 24 hours/day Type of Home: House Home Layout: Multi-level Alternate Level Stairs-Rails: Right Alternate Level Stairs-Number of Steps: 15 Home Access: Stairs to enter Entrance Stairs-Rails: Can reach both Entrance Stairs-Number of Steps: 6  Bathroom Shower/Tub: Multimedia programmer: Handicapped height Bathroom Accessibility: Yes Home Care Services: No  Discharge Living Setting Plans for Discharge Living Setting: Patient's home, Lives with (comment)(spouse) Type of Home at Discharge: House Discharge Home Layout: Multi-level Alternate Level Stairs-Rails: Right Alternate Level Stairs-Number of Steps: 15 Discharge Home Access: Stairs to enter Entrance Stairs-Rails: Right, Left, Can reach both Entrance Stairs-Number of Steps: 6 Discharge Bathroom Shower/Tub: Walk-in shower Discharge Bathroom Toilet: Handicapped height Discharge Bathroom Accessibility: Yes How Accessible: Accessible via walker Does the patient have any problems obtaining your medications?: No  Social/Family/Support Systems Patient Roles: Spouse, Parent Contact Information: Pamala Hurry, wife Anticipated Caregiver: wife  and family Anticipated Caregiver's Contact Information: see  above Ability/Limitations of Caregiver: no limitations Caregiver Availability: 24/7 Discharge Plan Discussed with Primary Caregiver: Yes Is Caregiver In Agreement with Plan?: Yes Does Caregiver/Family have Issues with Lodging/Transportation while Pt is in Rehab?: No(wife has been staying with pt 24/7)  Goals/Additional Needs Patient/Family Goal for Rehab: supervision to min assist with PT and OT, supervision with SLP Expected length of stay: ELOS 16 to 18 days Special Service Needs: patient restless and with confusion at night since admit per wife, not pta baseline Pt/Family Agrees to Admission and willing to participate: Yes Program Orientation Provided & Reviewed with Pt/Caregiver Including Roles  & Responsibilities: Yes  Decrease burden of Care through IP rehab admission: n/a  Possible need for SNF placement upon discharge: not anticipated  Patient Condition: This patient's condition remains as documented in the consult dated 03/15/2018, in which the Rehabilitation Physician determined and documented that the patient's condition is appropriate for intensive rehabilitative care in an inpatient rehabilitation facility. Will admit to inpatient rehab today.  Preadmission Screen Completed By:  Cleatrice Burke, 03/17/2018 1:33 PM ______________________________________________________________________   Discussed status with Dr. Posey Pronto on 03/17/2018 at  1343 and received telephone approval for admission today.  Admission Coordinator:  Cleatrice Burke, time 0349 Date 03/17/2018

## 2018-03-17 NOTE — Discharge Summary (Addendum)
Physician Discharge Summary  Patient ID: Marcus Beasley MRN: [id number] DOB/AGE: 01/10/1946 72 y.o.  Admit date: 03/12/2018 Discharge date: 03/17/2018  Admission Diagnoses:  Discharge Diagnoses:  Active Problems:   ICH (intracerebral hemorrhage) (HCC)   Paroxysmal A-fib (HCC)   Hemorrhagic stroke (HCC)   Diabetes mellitus type 2 in nonobese ([place])   Benign essential HTN   Dyslipidemia   Discharged Condition: stable  Hospital Course: Marcus Beasley is an 72 y.o. male with PMH significant for HTN, HLD,and DM who presented to [facility] ED as a code stroke for facial numbness, slurred speech, leg weakness, and facial droop. CTH showed Right Thalamic hypertensive ICH. MRA neg for underlying etiology. At home he takes ASA daily. Afib RVR was seen throughout hospitalization, req Cardizem gtt and now on PO rate control agent. Given the bleed, anticoagulation has not been started. This will be needed down the line, but not until further out pt f/u and repeat CTH done. This f/u will be done after stint at inpt rehab at [facility]. Will be ok to resume ASA daily in two weeks, or at time of d/c from rehab until time to start anticoagulation. [place] heparin for dvt ppx given mobility debility is also ok at this time. He remains w/focal deficits and will d/c today to inpt acute rehab.  D/c time today: 66min  Consults: Cardiology  Discharge Exam: Blood pressure (!) 155/94, pulse 79, temperature 98.7 F (37.1 C), temperature source Oral, resp. rate 20, height 5\' 11"  (1.803 m), weight 84.7 kg, SpO2 95 %. General - Well nourished, well developed, in no apparent distress.  Ophthalmologic - fundi not visualized due to noncooperation.  Cardiovascular - irregularly irregular heart rate and rhythm, rate better controlled now.  Mental Status -  A&Ox4 Language including expression, naming, repetition, comprehension was assessed and found intact. Fund of Knowledge was assessed and was intact.  Cranial Nerves II -  XII - II - Visual field intact OU. III, IV, VI - Extraocular movements intact. V - Facial sensation intact bilaterally. VII - mild-mod left facial droop. VIII - Hearing & vestibular intact bilaterally. X - Palate elevates symmetrically. XI - Chin turning & shoulder shrug intact bilaterally, but less brisk on left. XII - Tongue protrusion intact.  Motor Strength - The patient's strength was normal in RUE and RLE, but LUE 2/5 proximal, 3/5 distal, LLE 3/5 proximal and distal, some anti-gravity in arm, but cannot hold for full 10ct.  Bulk was normal and fasciculations were absent.   Motor Tone - Muscle tone was assessed at the neck and appendages and was normal.  Reflexes - The patient's reflexes were symmetrical in all extremities and he had no pathological reflexes.  Sensory - Light touch, temperature/pinprick were assessed and were decreased on the left, LUE and LLE 50% comparing with right.    Coordination - The patient had normal movements in the right hand with no ataxia or dysmetria.  Tremor was absent.  Gait and Station - deferred.  Disposition:    Allergies as of 03/17/2018   No Known Allergies     Medication List    STOP taking these medications   aspirin EC 81 MG tablet   bisoprolol-hydrochlorothiazide 5-6.25 MG tablet Commonly known as:  ZIAC   EYE VITAMINS PO   ibuprofen 200 MG tablet Commonly known as:  ADVIL,MOTRIN   onetouch ultrasoft lancets   ONETOUCH VERIO test strip Generic drug:  glucose blood   REFRESH OP   sildenafil 100 MG tablet Commonly known  as:  VIAGRA   VICKS VAPORUB EX     TAKE these medications    stroke: mapping our early stages of recovery book Misc 1 each by Does not apply route once for 1 dose.   acetaminophen 325 MG tablet Commonly known as:  TYLENOL Take 2 tablets (650 mg total) by mouth every 4 (four) hours as needed for mild pain (or temp > 37.5 C (99.5 F)).   atorvastatin 20 MG tablet Commonly known as:   LIPITOR Take 1 tablet (20 mg total) by mouth daily.   diltiazem 180 MG 24 hr capsule Commonly known as:  CARDIZEM CD Take 1 capsule (180 mg total) by mouth daily. Start taking on:  03/18/2018   heparin 5000 UNIT/ML injection Inject 1 mL (5,000 Units total) into the skin every 8 (eight) hours.   insulin aspart 100 UNIT/ML injection Commonly known as:  novoLOG Inject 0-15 Units into the skin 3 (three) times daily with meals.   insulin aspart 100 UNIT/ML injection Commonly known as:  novoLOG Inject 0-5 Units into the skin at bedtime.   meclizine 25 MG tablet Commonly known as:  ANTIVERT Take 1 tablet (25 mg total) by mouth as needed for dizziness. What changed:  when to take this   metFORMIN 850 MG tablet Commonly known as:  GLUCOPHAGE Take 1 tablet (850 mg total) by mouth 2 (two) times daily with a meal.   metoprolol tartrate 50 MG tablet Commonly known as:  LOPRESSOR Take 1 tablet (50 mg total) by mouth 2 (two) times daily.   multivitamin with minerals Tabs tablet Take 1 tablet by mouth daily. Centrum   senna-docusate 8.6-50 MG tablet Commonly known as:  Senokot-S Take 1 tablet by mouth 2 (two) times daily.   zolpidem 5 MG tablet Commonly known as:  AMBIEN Take 1 tablet (5 mg total) by mouth at bedtime as needed for sleep.      Follow-up Information    [facility] Follow up in 6 week(s).   Specialty:  Neurology Why:  Make appt for after acute rehab stay is completed Contact information: [place] [phone]          Signed: Elmyra Ricks 03/17/2018, 2:51 PM  ATTENDING NOTE: I reviewed above note and agree with the assessment and plan. Pt was seen and examined.   Patient doing well, no acute event overnight.  Still has left hemiparesis and left facial droop.  Heart rate controlled.  Approved by CIR today, well discharge to CIR for further rehab.  Follow-up with [facility] stroke  clinic in 4 weeks.  Rosalin Hawking, MD PhD Stroke Neurology 03/17/2018 4:30 PM

## 2018-03-18 ENCOUNTER — Inpatient Hospital Stay (HOSPITAL_COMMUNITY): Payer: Medicare Other | Admitting: Speech Pathology

## 2018-03-18 ENCOUNTER — Inpatient Hospital Stay (HOSPITAL_COMMUNITY): Payer: Medicare Other | Admitting: Physical Therapy

## 2018-03-18 ENCOUNTER — Inpatient Hospital Stay (HOSPITAL_COMMUNITY): Payer: Medicare Other | Admitting: Occupational Therapy

## 2018-03-18 DIAGNOSIS — I610 Nontraumatic intracerebral hemorrhage in hemisphere, subcortical: Secondary | ICD-10-CM

## 2018-03-18 DIAGNOSIS — I10 Essential (primary) hypertension: Secondary | ICD-10-CM

## 2018-03-18 DIAGNOSIS — I69354 Hemiplegia and hemiparesis following cerebral infarction affecting left non-dominant side: Secondary | ICD-10-CM

## 2018-03-18 DIAGNOSIS — E1169 Type 2 diabetes mellitus with other specified complication: Secondary | ICD-10-CM

## 2018-03-18 DIAGNOSIS — E669 Obesity, unspecified: Secondary | ICD-10-CM

## 2018-03-18 LAB — CBC WITH DIFFERENTIAL/PLATELET
Abs Immature Granulocytes: 0.1 10*3/uL (ref 0.0–0.1)
Basophils Absolute: 0 10*3/uL (ref 0.0–0.1)
Basophils Relative: 0 %
Eosinophils Absolute: 0.2 10*3/uL (ref 0.0–0.7)
Eosinophils Relative: 2 %
HCT: 44 % (ref 39.0–52.0)
Hemoglobin: 13.9 g/dL (ref 13.0–17.0)
Immature Granulocytes: 1 %
Lymphocytes Relative: 27 %
Lymphs Abs: 2 10*3/uL (ref 0.7–4.0)
MCH: 28 pg (ref 26.0–34.0)
MCHC: 31.6 g/dL (ref 30.0–36.0)
MCV: 88.5 fL (ref 78.0–100.0)
Monocytes Absolute: 0.9 10*3/uL (ref 0.1–1.0)
Monocytes Relative: 12 %
Neutro Abs: 4.2 10*3/uL (ref 1.7–7.7)
Neutrophils Relative %: 58 %
Platelets: 269 10*3/uL (ref 150–400)
RBC: 4.97 MIL/uL (ref 4.22–5.81)
RDW: 13.6 % (ref 11.5–15.5)
WBC: 7.3 10*3/uL (ref 4.0–10.5)

## 2018-03-18 LAB — COMPREHENSIVE METABOLIC PANEL
ALT: 13 U/L (ref 0–44)
AST: 18 U/L (ref 15–41)
Albumin: 3.5 g/dL (ref 3.5–5.0)
Alkaline Phosphatase: 55 U/L (ref 38–126)
Anion gap: 6 (ref 5–15)
BUN: 18 mg/dL (ref 8–23)
CO2: 24 mmol/L (ref 22–32)
Calcium: 9 mg/dL (ref 8.9–10.3)
Chloride: 108 mmol/L (ref 98–111)
Creatinine, Ser: 0.91 mg/dL (ref 0.61–1.24)
GFR calc Af Amer: >60 mL/min (ref >60)
GFR calc non Af Amer: >60 mL/min (ref >60)
Glucose, Bld: 106 mg/dL — ABNORMAL HIGH (ref 70–99)
Potassium: 4.2 mmol/L (ref 3.5–5.1)
Sodium: 138 mmol/L (ref 135–145)
Total Bilirubin: 0.3 mg/dL (ref 0.3–1.2)
Total Protein: 6.5 g/dL (ref 6.5–8.1)

## 2018-03-18 LAB — GLUCOSE, CAPILLARY
Glucose-Capillary: 97 mg/dL (ref 70–99)
Glucose-Capillary: 115 mg/dL — ABNORMAL HIGH (ref 70–99)
Glucose-Capillary: 95 mg/dL (ref 70–99)
Glucose-Capillary: 113 mg/dL — ABNORMAL HIGH (ref 70–99)

## 2018-03-18 MED ORDER — ATORVASTATIN CALCIUM 20 MG PO TABS
20.0000 mg | ORAL_TABLET | Freq: Every day | ORAL | Status: DC
  Administered 2018-03-18 – 2018-04-14 (×28): 20 mg via ORAL
  Filled 2018-03-18: qty 2
  Filled 2018-03-18 (×27): qty 1

## 2018-03-18 NOTE — Progress Notes (Signed)
Social Work  Social Work Assessment and Plan  Patient Details  Name: Marcus Beasley MRN: [id number] Date of Birth: [birth year]  Today's Date: 03/18/2018  Problem List:  Patient Active Problem List   Diagnosis Date Noted  . Diabetes mellitus type 2 in obese ([place])   . Hemiparesis affecting left side as late effect of stroke ([place])   . 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 Diabetes mellitus type 2 in nonobese ([facility])   . Benign essential HTN   . Dyslipidemia   . Paroxysmal A-fib ([place])   . Hemorrhagic stroke ([place])   . ICH (intracerebral hemorrhage) ([place]) 03/12/2018  . Prostate cancer ([place]) 01/29/2017  . 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 Diabetes ([place]) 12/14/2015  . PCP NOTES >>>>>>>>>>>>>>>>>>>>>>>>>>>>>>. 08/06/2015  . Dizziness and giddiness 01/29/2015  . Umbilical hernia 81/82/9937  . Elevated PSA, less than 10 ng/ml 04/19/2012  . Annual physical exam 01/24/2011  . Hyperlipidemia 01/03/2010  . Essential hypertension 09/20/2007   Past Medical History:  Past Medical History:  Diagnosis Date  . BPH (benign prostatic hyperplasia)    (-) Bx 2015  . Diabetes mellitus without complication ([place])   . Elevated PSA    Prostate Bx in 06/2013 was benign  . HTN (hypertension)   . Hyperlipidemia   . Macular degeneration, age related    Past Surgical History:  Past Surgical History:  Procedure Laterality Date  . ABCESS DRAINAGE     abdomen- 26 day hospitalization 1968  . COLONOSCOPY  2016  . HERNIA REPAIR  summer '11   umbilical, dr Ninfa Linden   . POLYPECTOMY    . PROSTATE BIOPSY  06-2013 , 07-2017   (-), (-)   Social History:  reports that he has never smoked. He has never used smokeless tobacco. He reports that he drinks alcohol. He reports that he does not use drugs.  Family / Support Systems Marital Status: Married How Long?: 50 years Patient Roles: Spouse, Parent Spouse/Significant Other: Barbara [phone] Children: Two daughter's who are  local Other Supports: Friends and church members Anticipated Caregiver: Wife and daughter's Ability/Limitations of Caregiver: no limitations-unless much physical care Caregiver Availability: 24/7 Family Dynamics: Close knit family who depend upon one another. Their son was murdered in 1993 and their two daughter's are very protective and involved with parents. They have church members and extended family who are supportive.  Social History Preferred language: English Religion:  Cultural Background: No issues Education: High School Read: Yes Write: Yes Employment Status: Retired Date Retired/Disabled/Unemployed: Engineer, manufacturing Issues: No issues Guardian/Conservator: none-according to MD pt is capable of making his own decisions while here. Wife will be here daily and is involved in any decision making   Abuse/Neglect Abuse/Neglect Assessment Can Be Completed: Yes Physical Abuse: Denies Verbal Abuse: Denies Sexual Abuse: Denies Exploitation of patient/patient's resources: Denies Self-Neglect: Denies  Emotional Status Pt's affect, behavior adn adjustment status: Pt is motivated to do well and recover from this stroke. He voiced: " I guess we take for granted how well we function until something like this happens." He has always been independent and taken care of himself. He would like to get back to this. Recent Psychosocial Issues: other health issues-he thought he was healthy Pyschiatric History: No history deferred depression screening due to verbalizing concerns and feelings. Will ask team for their input and make referral to neuro-psych if needed. Substance Abuse History: No issues  Patient / Family Perceptions, Expectations & Goals Pt/Family understanding of illness &  functional limitations: Pt and wife can explain his stroke and deficits. They are hopeful he will do well here and recover. Wife stays sometimes and talks with the MD along with pt each am when  rounding. Both feel they have a good understanding of his treatment plan going forward. Premorbid pt/family roles/activities: Husband, father, grandfather, retiree, church member, friend, etc Anticipated changes in roles/activities/participation: resume Pt/family expectations/goals: Pt states: " I want to be able to do as much as I can for myself before I leave here." Wife states: " I know he wants to take care of himself he is a proud man, but we will assist if needed."  [employer]: None Premorbid Home Care/DME Agencies: None Transportation available at discharge: Family Resource referrals recommended: Support group (specify)  Discharge Planning Living Arrangements: Spouse/significant other Support Systems: Spouse/significant other, Children, Other relatives, Water engineer, Social worker community Type of Residence: Private residence Insurance Resources: Multimedia programmer (specify)([employer]-Medicare) Financial Resources: Radio broadcast assistant Screen Referred: No Living Expenses: Own Money Management: Spouse, Patient Does the patient have any problems obtaining your medications?: No Home Management: Both he and wife Patient/Family Preliminary Plans: Return home with wife assisting if needed. Both of their daughter's are willing to assist also when not working. All plan to be here daily and see pt's progress in therapies. Will await team evaluations and work on a safe discharge plan. Social Work Anticipated Follow Up Needs: HH/OP, Support Group  Clinical Impression Pleasant gentleman who is motivated to do well and will push himself in therapies. He wants to recover form this stroke and be able to take care of himself and not burden his wife or daughter's. Wife and two daughter's are very involved and will be here daily. Will work on discharge needs and await therapists evaluations.  Elease Hashimoto 03/18/2018, 1:34 PM

## 2018-03-18 NOTE — Evaluation (Signed)
Speech Language Pathology Assessment and Plan  Patient Details  Name: Marcus Beasley MRN: [id number] Date of Birth: August 17, 1945  SLP Diagnosis: Cognitive Impairments  Rehab Potential: Good ELOS: 21-25 days     Today's Date: 03/18/2018 SLP Individual Time: 3295-1884 SLP Individual Time Calculation (min): 60 min   Problem List:  Patient Active Problem List   Diagnosis Date Noted  . Diabetes mellitus type 2 in obese ([place])   . Hemiparesis affecting left side as late effect of stroke ([place])   . 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 Diabetes mellitus type 2 in nonobese ([facility])   . Benign essential HTN   . Dyslipidemia   . Paroxysmal A-fib ([place])   . Hemorrhagic stroke ([place])   . ICH (intracerebral hemorrhage) ([place]) 03/12/2018  . Prostate cancer ([place]) 01/29/2017  . 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 Diabetes ([place]) 12/14/2015  . PCP NOTES >>>>>>>>>>>>>>>>>>>>>>>>>>>>>>. 08/06/2015  . Dizziness and giddiness 01/29/2015  . Umbilical hernia 16/60/6301  . Elevated PSA, less than 10 ng/ml 04/19/2012  . Annual physical exam 01/24/2011  . Hyperlipidemia 01/03/2010  . Essential hypertension 09/20/2007   Past Medical History:  Past Medical History:  Diagnosis Date  . BPH (benign prostatic hyperplasia)    (-) Bx 2015  . Diabetes mellitus without complication ([place])   . Elevated PSA    Prostate Bx in 06/2013 was benign  . HTN (hypertension)   . Hyperlipidemia   . Macular degeneration, age related    Past Surgical History:  Past Surgical History:  Procedure Laterality Date  . ABCESS DRAINAGE     abdomen- 26 day hospitalization 1968  . COLONOSCOPY  2016  . HERNIA REPAIR  summer '11   umbilical, dr Ninfa Linden   . POLYPECTOMY    . PROSTATE BIOPSY  06-2013 , 07-2017   (-), (-)    Assessment / Plan / Recommendation Clinical Impression   Marcus Beasley is a 72 year old right-handed male history of diabetes mellitus, hypertension, hyperlipidemia.  Per chart review,  patient and family, patient lives with spouse.  Independent prior to admission and retired.  Multilevel home with 6 steps to entry.  Wife can assist as needed.  2 daughters in the area work.  Presented 03/12/2018 with left-sided weakness and slurred speech with facial numbness.  Blood pressure 168/98. MRI reviewed, showing right thalamic hemorrhage.  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  Carotid Dopplers with no ICA stenosis.  Echocardiogram with ejection fraction of 70% no wall motion abnormalities.  During admission patient developed PAF with RVR received intravenous Cardizem.  Cardiology services consulted and currently maintained with rate control on metoprolol as well as Cardizem.  Subcutaneous heparin for DVT prophylaxis initiated 03/14/2018.  Tolerating a regular diet.  Therapy evaluations completed with recommendations of physical medicine rehab consult.  Patient was admitted for a comprehensive rehab program.  SLP evaluation was completed on 03/18/2018 with the following results: Pt presents with mild higher level cognitive deficits characterized by decreased executive functioning, decreased recall of new, complex information, and decreased emergent/anticipatory awareness of his deficits.  Pt also has left inattention.  Pt denies any acute cognitive changes but wife reports that pt is "slower" than usual.  As a result, pt currently needs min assist for cognitive tasks and would benefit from skilled ST while inpatient in order to maximize  functional independence and reduce burden of care prior to discharge.  Anticipate that pt may need 24/7 supervision at discharge in addition to [place] follow up at next level of care.    Skilled Therapeutic Interventions          SLP administered the Cognistat with pt scoring in the range of moderate impairment for  attention, although suspect this is really an indication of working memory deficits.  Pt scored within the range of mild impairment for constructional and calculation subtests.  Pt was borderline impaired for repetition and memory.  Discussed findings with pt and his wife who were in agreement with plan of care.    SLP Assessment  Patient will need skilled [place] [facility] Services during CIR admission    Recommendations  Patient destination: Home Follow up Recommendations: Home Health SLP;24 hour supervision/assistance Equipment Recommended: None recommended by SLP    SLP Frequency 3 to 5 out of 7 days   SLP Duration  SLP Intensity  SLP Treatment/Interventions 21-25 days   Minumum of 1-2 x/day, 30 to 90 minutes  Cognitive remediation/compensation;Cueing hierarchy;Environmental controls;Internal/external aids;Patient/family education    Pain Pain Assessment Pain Scale: 0-10 Pain Score: 0-No pain  Prior Functioning Cognitive/Linguistic Baseline: Within functional limits Type of Home: House  Lives With: Spouse Available Help at Discharge: Family Education: community college Vocation: Retired  Function:  Eating Eating   Modified Consistency Diet: No Eating Assist Level: Set up assist for   Eating Set Up Assist For: Opening containers;Cutting food       Cognition Comprehension Comprehension assist level: Understands complex 90% of the time/cues 10% of the time  Expression   Expression assist level: Expresses complex 90% of the time/cues < 10% of the time  Social Interaction Social Interaction assist level: Interacts appropriately with others with medication or extra time (anti-anxiety, antidepressant).  Problem Solving Problem solving assist level: Solves basic 75 - 89% of the time/requires cueing 10 - 24% of the time  Memory Memory assist level: Recognizes or recalls 75 - 89% of the time/requires cueing 10 - 24% of the time   Short Term Goals: Week 1: SLP  Short Term Goal 1 (Week 1): Pt will complete semi-complex cognitive tasks with supervision cues for functional problem solving.  SLP Short Term Goal 2 (Week 1): Pt will recall complex, new information with supervision cues for use of external aids.  SLP Short Term Goal 3 (Week 1): Pt will locate objects to the left of midline during functional tasks with supervision cues.    Refer to Care Plan for Long Term Goals  Recommendations for other services: None   Discharge Criteria: Patient will be discharged from SLP if patient refuses treatment 3 consecutive times without medical reason, if treatment goals not met, if there is a change in medical status, if patient makes no progress towards goals or if patient is discharged from hospital.  The above assessment, treatment plan, treatment alternatives and goals were discussed and mutually agreed upon: by patient and by family  Amberly Livas, Selinda Orion 03/18/2018, 11:59 AM

## 2018-03-18 NOTE — Consult Note (Signed)
            [facility] CM Primary Care Navigator  03/18/2018  Marcus Beasley January 05, 1946 [id number]   Attemptto see patient at the bedside to identify possible discharge needs buthe was [id number] for comprehensive rehab program.  Per MD note, patient presented to [facility] ED as a code stroke for facial numbness, slurred speech, leg weakness and facial droop. ([place]- intracerebral hemorrhage)  Patient has discharge instruction to follow-upwith cardiology (will call for appointment) and neurology follow-up in 4 weeks.  Primary care provider's office is listed as providing transition of care (TOC) follow-up.   For additional questions please contact:  Edwena Felty A. Poppy Mcafee, BSN, RN-BC [facility] PRIMARY CARE Navigator Cell: [phone]

## 2018-03-18 NOTE — Progress Notes (Signed)
Occupational Therapy Assessment and Plan  Patient Details  Name: ADIAN JABLONOWSKI MRN: [id number] Date of Birth: Jun 17, 1946  OT Diagnosis: abnormal posture, hemiplegia affecting non-dominant side and muscle weakness (generalized) Rehab Potential: Rehab Potential (ACUTE ONLY): Fair ELOS: 18-21 days    Today's Date: 03/18/2018 OT Individual Time: 7035-0093 OT Individual Time Calculation (min): 60 min     Problem List:  Patient Active Problem List   Diagnosis Date Noted  . Diabetes mellitus type 2 in obese ([place])   . Hemiparesis affecting left side as late effect of stroke ([place])   . Thalamic hemorrhage ([place]) 03/17/2018  . Diabetes mellitus type 2 in nonobese ([facility])   . Benign essential HTN   . Dyslipidemia   . Paroxysmal A-fib ([place])   . Hemorrhagic stroke ([place])   . ICH (intracerebral hemorrhage) ([place]) 03/12/2018  . Prostate cancer ([place]) 01/29/2017  . 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 Diabetes ([place]) 12/14/2015  . PCP NOTES >>>>>>>>>>>>>>>>>>>>>>>>>>>>>>. 08/06/2015  . Dizziness and giddiness 01/29/2015  . Umbilical hernia 81/82/9937  . Elevated PSA, less than 10 ng/ml 04/19/2012  . Annual physical exam 01/24/2011  . Hyperlipidemia 01/03/2010  . Essential hypertension 09/20/2007    Past Medical History:  Past Medical History:  Diagnosis Date  . BPH (benign prostatic hyperplasia)    (-) Bx 2015  . Diabetes mellitus without complication ([place])   . Elevated PSA    Prostate Bx in 06/2013 was benign  . HTN (hypertension)   . Hyperlipidemia   . Macular degeneration, age related    Past Surgical History:  Past Surgical History:  Procedure Laterality Date  . ABCESS DRAINAGE     abdomen- 26 day hospitalization 1968  . COLONOSCOPY  2016  . HERNIA REPAIR  summer '11   umbilical, dr Ninfa Linden   . POLYPECTOMY    . PROSTATE BIOPSY  06-2013 , 07-2017   (-), (-)    Assessment & Plan Clinical Impression: Patient is a 72 y.o. year old male with recent admission  to the hospital on 03/12/2018 with left-sided weakness and slurred speech with facial numbness.  Blood pressure 168/98. MRI reviewed, showing right thalamic hemorrhage.  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  Carotid Dopplers with no ICA stenosis.  Echocardiogram with ejection fraction of 70% no wall motion abnormalities.  During admission patient developed PAF with RVR received intravenous Cardizem.  Cardiology services consulted and currently maintained with rate control on metoprolol as well as Cardizem.  Subcutaneous heparin for DVT prophylaxis initiated 03/14/2018.  Tolerating a regular diet.  Patient transferred to CIR on 03/17/2018 .    Patient currently requires max with basic self-care skills secondary to muscle weakness, decreased cardiorespiratoy endurance, decreased midline orientation and decreased attention to left and decreased sitting balance, decreased standing balance, decreased postural control, hemiplegia and decreased balance strategies.  Prior to hospitalization, patient could complete ADL/IADLs with independent .  Patient will benefit from skilled intervention to decrease level of assist with basic self-care skills, increase independence with basic self-care skills and increase level of independence with iADL prior to discharge home with care partner.  Anticipate patient will require minimal physical assistance and follow up home health.  OT - End of Session Activity Tolerance: Tolerates 10 - 20 min activity with multiple rests Endurance Deficit: Yes OT Assessment Rehab Potential (ACUTE ONLY):  Fair OT Barriers to Discharge: Home environment access/layout OT Barriers to Discharge Comments: Pt's home has 3 levels with full bathroom available on 2nd floor- 15 stairs to reach  OT Patient demonstrates impairments in the following  area(s): Balance;Safety;Motor;Endurance;Cognition OT Basic ADL's Functional Problem(s): Eating;Grooming;Bathing;Dressing;Toileting OT Transfers Functional Problem(s): Toilet(Tub/shower on second unit- will revisit closer to d/c) OT Additional Impairment(s): Fuctional Use of Upper Extremity OT Plan OT Intensity: Minimum of 1-2 x/day, 45 to 90 minutes OT Duration/Estimated Length of Stay: 21-24 days  OT Treatment/Interventions: Balance/vestibular training;Discharge planning;Functional electrical stimulation;Self Care/advanced ADL retraining;Therapeutic Activities;UE/LE Coordination activities;Cognitive remediation/compensation;Disease mangement/prevention;Functional mobility training;Patient/family education;Skin care/wound managment;Therapeutic Exercise;Neuromuscular re-education;Psychosocial support;Splinting/orthotics;UE/LE Strength taining/ROM;Wheelchair propulsion/positioning;DME/adaptive equipment instruction;Community reintegration OT Self Feeding Anticipated Outcome(s): mod I  OT Basic Self-Care Anticipated Outcome(s): supervision  OT Toileting Anticipated Outcome(s): supervision  OT Bathroom Transfers Anticipated Outcome(s): supervision  OT Recommendation Recommendations for Other Services: Therapeutic Recreation consult Therapeutic Recreation Interventions: Kitchen group;Outing/community reintergration;Stress management Patient destination: Home Follow Up Recommendations: Home health OT Equipment Recommended: To be determined   Skilled Therapeutic Intervention Pt sitting in w/c upon entry with wife present and reports of 4/10 pain present in L shoulder- by end of session pt agreeable for nursing to distribute pain meds. Assessments completed during evaluation session- see details below. Pt performed sit to stand this session with max A + 2 due to decreased strength and strong L lateral lean. Toilet transfer completed using STEDY + 2 for safety. Pt completed bathing and dressing tasks  while seated at sink in w/c with pt demonstrating impulsivity and decreased safety awareness during anterior weight shift movements during LB bathing/dressing requiring min A to maintain upright positioning. Standing at sink, pt req max A + 2 for maintaining standing position at sink and max A for pulling LB dressing up. Pt returned to w/c and transferred onto [facility] with pt able to obtain standing position with mod A + 2 with max VC's for maintaining hand position. Simulated toilet transfer completed with STEDY to over the toilet BSC.   Pt transferred back to w/c and discussed with pt and family regarding CIR process, OT role, and goals with verbal comprehension. Pt left in room seated in w/c with lap belt placed and meal tray set up for lunch.   OT Evaluation Precautions/Restrictions  Precautions Precautions: Fall Precaution Comments: L hemiparesis- L lateral lean sitting unsupported and in standing  Restrictions Weight Bearing Restrictions: No Home Living/Prior Functioning Home Living Family/patient expects to be discharged to:: Private residence Available Help at Discharge: Family Type of Home: House Home Access: Stairs to enter Technical brewer of Steps: 5-6 Entrance Stairs-Rails: Right, Left(can't reach at same time) Home Layout: Multi-level Alternate Level Stairs-Number of Steps: 15 Alternate Level Stairs-Rails: Right(use when descending) Bathroom Shower/Tub: Multimedia programmer: Standard Bathroom Accessibility: No Additional Comments: Pt has half bath on main level however full bathroom on second floor   Lives With: Spouse, Daughter(Daughter and granddaughter live in house Monday-Friday ) IADL History Homemaking Responsibilities: Yes Meal Prep Responsibility: Secondary Laundry Responsibility: Secondary Cleaning Responsibility: Secondary Current License: Yes Mode of Transportation: Car Education: community college Occupation: Retired Prior Function Level of  Independence: Independent with basic ADLs, Independent with transfers, Independent with homemaking with ambulation, Independent with gait  Able to Take Stairs?: Yes Driving: Yes Vocation: Retired Leisure: Hobbies-yes (Comment)(yardwork; walking in mornings) 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, driving  Vision Baseline Vision/History: No visual deficits Wears Glasses: Reading only Patient Visual Report: No change from baseline Vision Assessment?: Yes Ocular Range of  Motion: Within Functional Limits Tracking/Visual Pursuits: Decreased smoothness of eye movement to LEFT superior field;Decreased smoothness of eye movement to LEFT inferior field Saccades: Within functional limits Convergence: Within functional limits Visual Fields: No apparent deficits Perception  Perception: Impaired Inattention/Neglect: Does not attend to left side of body(Inattention to L side of body ) Praxis Praxis: Intact Cognition Overall Cognitive Status: Impaired/Different from baseline Arousal/Alertness: Awake/alert Year: 2019 Month: September Day of Week: Correct Memory: Impaired Memory Impairment: Retrieval deficit;Decreased recall of new information Immediate Memory Recall: (Did not formally assess immediate recall however all other steps completed) Attention: Sustained Sustained Attention: Appears intact Awareness: Impaired Awareness Impairment: Emergent impairment;Anticipatory impairment Problem Solving: Impaired Problem Solving Impairment: Verbal complex;Functional complex Executive Function: Self Correcting Self Correcting: Impaired Safety/Judgment: Impaired Sensation Sensation Light Touch: Impaired Detail Central sensation comments: Pt did not recognize any stimulus on dorsal or ventral side  Light Touch Impaired Details: Absent LUE Hot/Cold: Impaired Detail Proprioception: Impaired Detail Proprioception Impaired Details: Impaired LUE Stereognosis: Impaired Detail Stereognosis  Impaired Details: Absent LUE Additional Comments: Intact RUE Coordination Gross Motor Movements are Fluid and Coordinated: No Fine Motor Movements are Fluid and Coordinated: No Coordination and Movement Description: Pt's gross and fine motor movements of LUE are uncoordinated and pt compensates with trunk to initiate movement of LUE  Finger Nose Finger Test: unable to complete due to weakness   Motor  Motor Motor: Hemiplegia Motor - Skilled Clinical Observations: Strong L lateral lean in sitting and standing with pusher tendencies of R UE/LE  Trunk/Postural Assessment  Cervical Assessment Cervical Assessment: Exceptions to WFL(forward head ) Thoracic Assessment Thoracic Assessment: Exceptions to WFL(rounded shoulders with kyphosis ) Lumbar Assessment Lumbar Assessment: Exceptions to WFL(posterior pelvic tilt ) Postural Control Postural Control: Deficits on evaluation Trunk Control: L lateral lean Postural Limitations: kyphotic with forward and flexed head  Balance Balance Balance Assessed: Yes Static Sitting Balance Static Sitting - Balance Support: Right upper extremity supported;Feet supported Static Sitting - Level of Assistance: 5: Stand by assistance;4: Min assist(Sitting in w/c with L arm rest back- pt with L lateral leaning ) Dynamic Sitting Balance Dynamic Sitting - Balance Support: Feet supported Dynamic Sitting - Level of Assistance: 4: Min assist;3: Mod assist Dynamic Sitting Balance - Compensations: Seated in w/c performing LB dressing  Sitting balance - Comments: able to sit EOB with R UE support on bedrail ~9mn Static Standing Balance Static Standing - Balance Support: Right upper extremity supported;Left upper extremity supported Static Standing - Level of Assistance: 1: +2 Total assist Static Standing - Comment/# of Minutes: less than 1 minute- pt with notable L lateral leaning  Dynamic Standing Balance Dynamic Standing - Balance Support: Right upper extremity  supported Dynamic Standing - Level of Assistance: 1: +2 Total assist Dynamic Standing - Comments: Standing at sink; toilet transfer STEDY + 2  Extremity/Trunk Assessment RUE Assessment RUE Assessment: Within Functional Limits General Strength Comments: 4/5  LUE Assessment LUE Assessment: Exceptions to [facility] (PROM) Comments: shoulder: 90 degrees; elbow: WFL  Active Range of Motion (AROM) Comments: shoulder: 0 degrees; elbow: flexor synergy pattern  General Strength Comments: Shoulder: 1/5; elbow: 2-/5; grip: cannot maintain functional grip however strength present   LUE Body System: Neuro Brunstrum levels for arm and hand: Arm Brunstrum level for arm: Stage II Synergy is developing   See Function Navigator for Current Functional Status.   Refer to Care Plan for Long Term Goals  Recommendations for other services: Other: none   Discharge Criteria: Patient will be  discharged from OT if patient refuses treatment 3 consecutive times without medical reason, if treatment goals not met, if there is a change in medical status, if patient makes no progress towards goals or if patient is discharged from hospital.  The above assessment, treatment plan, treatment alternatives and goals were discussed and mutually agreed upon: by patient  Jesusmanuel Erbes 03/18/2018, 1:08 PM

## 2018-03-18 NOTE — Progress Notes (Signed)
PHYSICAL MEDICINE & REHABILITATION     PROGRESS NOTE  Subjective/Complaints:  Patient seen lying in bed this morning.  He states he slept well overnight.  He states he is ready to begin therapies.  ROS: Denies CP, S OB, nausea, vomiting, diarrhea.  Objective: Vital Signs: Blood pressure (!) 143/93, pulse 70, temperature 98.3 F (36.8 C), temperature source Oral, resp. rate 20, height 5\' 11"  (1.803 m), weight 101.3 kg, SpO2 97 %. No results found. Recent Labs    03/17/18 0237 03/18/18 0624  WBC 8.8 7.3  HGB 13.9 13.9  HCT 43.9 44.0  PLT 261 269   Recent Labs    03/17/18 0237 03/18/18 0624  NA 137 138  K 4.2 4.2  CL 104 108  GLUCOSE 106* 106*  BUN 18 18  CREATININE 0.90 0.91  CALCIUM 9.3 9.0   CBG (last 3)  Recent Labs    03/17/18 1723 03/17/18 2218 03/18/18 0640  GLUCAP 100* 98 97    Wt Readings from Last 3 Encounters:  03/17/18 101.3 kg  03/12/18 84.7 kg  11/11/17 86.6 kg    Physical Exam:  BP (!) 143/93 (BP Location: Right Arm)   Pulse 70   Temp 98.3 F (36.8 C) (Oral)   Resp 20   Ht 5\' 11"  (1.803 m)   Wt 101.3 kg   SpO2 97%   BMI 31.15 kg/m  Constitutional: He appears well-developed and well-nourished.  HENT: Normocephalic and atraumatic. Poor dentition  Eyes: EOM are normal.  No discharge.  Cardiovascular: RRR.  No JVD. Respiratory: Effort normal and breath sounds normal.  GI: Bowel sounds are normal. He exhibits no distension.  Musculoskeletal: No edema or tenderness in extremities  Neurological: He is alert and oriented Follows basic commands  Motor: RUE/RLE: 5/5 proximal to distal LUE: Shoulder abduction 2-/5, elbow flex/ext 2/5, hand grip 4/5 LLE: HF, KE, ADF 4/5 Left facial droop Skin: Skin is warm and dry.  Psychiatric: He has a normal mood and affect. His behavior is normal. Thought content normal.   Assessment/Plan: 1. Functional deficits secondary to right thalamic hemorrhage which require 3+ hours per day of  interdisciplinary therapy in a comprehensive inpatient rehab setting. Physiatrist is providing close team supervision and 24 hour management of active medical problems listed below. Physiatrist and rehab team continue to assess barriers to discharge/monitor patient progress toward functional and medical goals.  Function:  Bathing Bathing position      Bathing parts      Bathing assist        Upper Body Dressing/Undressing Upper body dressing                    Upper body assist        Lower Body Dressing/Undressing Lower body dressing                                  Lower body assist        Toileting Toileting          Toileting assist     Transfers Chair/bed transfer             Locomotion Ambulation           Wheelchair          Cognition Comprehension    Expression    Social Interaction    Problem Solving    Memory      Medical  Problem List and Plan: 1.  Left-sided weakness secondary to right thalamic hemorrhage secondary to hypertensive crisis  Begin CIR 2.  DVT Prophylaxis/Anticoagulation: Subcutaneous heparin initiated 03/14/2018.  Monitor for any bleeding episodes 3. Pain Management: Tylenol as needed 4. Mood: Provide emotional support 5. Neuropsych: This patient is capable of making decisions on his own behalf. 6. Skin/Wound Care: Routine skin checks 7. Fluids/Electrolytes/Nutrition: Routine in and outs   BMP within acceptable range on 9/26 8.  PAF with RVR.  Cardizem CD 180 mg daily, Lopressor 50 mg twice daily.  Cardiac rate controlled.  Follow-up cardiology services 9.  Diabetes mellitus. Hemoglobin A1c 6.4.  Glucophage 850 mg twice daily.  Check blood sugars before meals and at bedtime.  Monitor with increased mobility 10.  Hyperlipidemia.  Patient on Lipitor prior to admission 11.  Hypertension  Monitor with increased mobility  LOS (Days) 1 A FACE TO FACE EVALUATION WAS PERFORMED  Berthel Bagnall [name] 03/18/2018 8:20 AM

## 2018-03-18 NOTE — Care Management Note (Signed)
[facility] Individual Statement of Services  Patient Name:  Marcus Beasley  Date:  03/18/2018  Welcome to the [facility].  Our goal is to provide you with an individualized program based on your diagnosis and situation, designed to meet your specific needs.  With this comprehensive rehabilitation program, you will be expected to participate in at least 3 hours of rehabilitation therapies Monday-Friday, with modified therapy programming on the weekends.  Your rehabilitation program will include the following services:  Physical Therapy (PT), Occupational Therapy (OT), Speech Therapy (ST), 24 hour per day rehabilitation nursing, Case Management (Social Worker), Rehabilitation Medicine, Nutrition Services and Pharmacy Services  Weekly team conferences will be held on Wednesday to discuss your progress.  Your Social Worker will talk with you frequently to get your input and to update you on team discussions.  Team conferences with you and your family in attendance may also be held.  Expected length of stay: 18-21 days  Overall anticipated outcome: supervision-min assist level  Depending on your progress and recovery, your program may change. Your Social Worker will coordinate services and will keep you informed of any changes. Your Social Worker's name and contact numbers are listed  below.  The following services may also be recommended but are not provided by the [facility] will be made to provide these services after discharge if needed.  Arrangements include referral to agencies that provide these services.  Your insurance has been verified to be:  [employer]-Medicare Your primary doctor is:  Kathlene November  Pertinent information will be shared with your doctor and your insurance company.  Social Worker:  Ovidio Kin, [place] or ([phone]  Information discussed with and copy given to patient by: Elease Hashimoto, 03/18/2018, 9:07 AM

## 2018-03-18 NOTE — Progress Notes (Signed)
Physical Therapy Assessment and Plan  Patient Details  Name: Marcus Beasley MRN: [id number] Date of Birth: [birth year]  PT Diagnosis: Abnormal posture, Abnormality of gait, Difficulty walking, Hemiparesis non-dominant, Impaired sensation and Muscle weakness Rehab Potential: Good ELOS: 3-3.5 weeks   Today's Date: 03/18/2018 PT Individual Time:   800-930 Total time: 90 mins  Problem List:  Patient Active Problem List   Diagnosis Date Noted  . 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 Diabetes mellitus type 2 in nonobese (HCC)   . Benign essential HTN   . Dyslipidemia   . Paroxysmal A-fib ([place])   . Hemorrhagic stroke ([place])   . ICH (intracerebral hemorrhage) ([place]) 03/12/2018  . Prostate cancer ([place]) 01/29/2017  . 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 Diabetes ([place]) 12/14/2015  . PCP NOTES >>>>>>>>>>>>>>>>>>>>>>>>>>>>>>. 08/06/2015  . Dizziness and giddiness 01/29/2015  . Umbilical hernia 84/69/6295  . Elevated PSA, less than 10 ng/ml 04/19/2012  . Annual physical exam 01/24/2011  . Hyperlipidemia 01/03/2010  . Essential hypertension 09/20/2007    Past Medical History:  Past Medical History:  Diagnosis Date  . BPH (benign prostatic hyperplasia)    (-) Bx 2015  . Diabetes mellitus without complication ([place])   . Elevated PSA    Prostate Bx in 06/2013 was benign  . HTN (hypertension)   . Hyperlipidemia   . Macular degeneration, age related    Past Surgical History:  Past Surgical History:  Procedure Laterality Date  . ABCESS DRAINAGE     abdomen- 26 day hospitalization 1968  . COLONOSCOPY  2016  . HERNIA REPAIR  summer '11   umbilical, dr Ninfa Linden   . POLYPECTOMY    . PROSTATE BIOPSY  06-2013 , 07-2017   (-), (-)    Assessment & Plan Clinical Impression: Patient transferred to CIR on 03/17/2018 . Marcus Beasley is a 72 year old right-handed male history of diabetes mellitus, hypertension, hyperlipidemia.  Per chart review, patient and family, patient  lives with spouse.  Independent prior to admission and retired.  Multilevel home with 6 steps to entry.  Wife can assist as needed.  2 daughters in the area work.  Presented 03/12/2018 with left-sided weakness and slurred speech with facial numbness.  Blood pressure 168/98. MRI reviewed, showing right thalamic hemorrhage.  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  Carotid Dopplers with no ICA stenosis.  Echocardiogram with ejection fraction of 70% no wall motion abnormalities.  During admission patient developed PAF with RVR received intravenous Cardizem.  Cardiology services consulted and currently maintained with rate control on metoprolol as well as Cardizem.  Subcutaneous heparin for DVT prophylaxis initiated 03/14/2018.  Tolerating a regular diet.  Therapy evaluations completed with recommendations of physical medicine rehab consult.  Patient was admitted for a comprehensive rehab program.  Patient currently requires total with mobility secondary to muscle weakness and left-sided hemiparesis, decreased cardiorespiratoy endurance, impaired timing and sequencing, unbalanced muscle activation, decreased coordination and decreased motor planning, decreased visual acuity and decreased visual perceptual skills and decreased midline orientation and decreased motor planning.  Prior to hospitalization, patient was independent  with mobility and lived with Spouse, Daughter(Daughter and granddaughter live in house Monday-Friday ) in a House home.  Home access is  Stairs to enter.  Patient will benefit from skilled PT intervention to maximize safe functional mobility, minimize fall risk  and decrease caregiver burden for planned discharge home with 24 hour assist.  Anticipate patient will benefit from follow up B and E at discharge.  PT - End of Session Activity  Tolerance: Tolerates 30+ min activity with multiple rests Endurance Deficit: Yes Endurance Deficit Description: required rest breaks PT Assessment Rehab Potential (ACUTE/IP ONLY): Good PT Barriers to Discharge: Home environment access/layout;Inaccessible home environment PT Barriers to Discharge Comments: 5-6 stairs to enter w/ 1 handrail and 15 to second level w/ 1 handrail; shower upstairs PT Patient demonstrates impairments in the following area(s): Balance;Motor;Endurance;Perception;Safety;Behavior;Sensory PT Transfers Functional Problem(s): Bed to Chair;Furniture;Bed Mobility;Car PT Locomotion Functional Problem(s): Ambulation;Stairs;Wheelchair Mobility PT Plan PT Intensity: Minimum of 1-2 x/day ,45 to 90 minutes PT Frequency: 5 out of 7 days PT Duration Estimated Length of Stay: 3-3.5 weeks PT Treatment/Interventions: Ambulation/gait training;Balance/vestibular training;Community reintegration;Discharge planning;DME/adaptive equipment instruction;Functional mobility training;Neuromuscular re-education;Patient/family education;Stair training;Therapeutic Activities;Therapeutic Exercise;UE/LE Coordination activities;Wheelchair propulsion/positioning;UE/LE Strength taining/ROM;Visual/perceptual remediation/compensation;Splinting/orthotics;Psychosocial support;Disease management/prevention;Cognitive remediation/compensation PT Transfers Anticipated Outcome(s): Supervision PT Locomotion Anticipated Outcome(s): minA PT Recommendation Recommendations for Other Services: Therapeutic Recreation consult Therapeutic Recreation Interventions: Outing/community reintergration;Kitchen group Follow Up Recommendations: Home health PT;24 hour supervision/assistance Patient destination: Home Equipment Recommended: To be determined  Skilled Therapeutic Intervention Pt received laying in bed. Denies pain and agreeable to PT. Wife, Pamala Hurry, present at beginning of session. Pt is minA for bed mobility and  totalA for transfers and locomotion as described below. Pt provided with a w/c for primary means of locomotion. Educated pt and wife in rehab process, goals, and estimated LOS to be determined once all evaluations completed. Pt remained in w/c with all needs in reach and wife present at the end of the session.  PT Evaluation Precautions/Restrictions Precautions Precautions: Fall Precaution Comments: L hemiparesis- L lateral lean sitting unsupported and in standing  Restrictions Weight Bearing Restrictions: No General   Vital SignsTherapy Vitals Temp: 98.3 F (36.8 C) Temp Source: Oral Pulse Rate: 70 Resp: 20 BP: (!) 143/93 Patient Position (if appropriate): Lying Oxygen Therapy SpO2: 97 % O2 Device: Room Air Pain Pain Assessment Pain Scale: 0-10 Pain Score: 0-No pain Pain Type: Acute pain Pain Location: Shoulder Pain Orientation: Left Pain Descriptors / Indicators: Aching Pain Frequency: Occasional Pain Onset: On-going Pain Intervention(s): Medication (See eMAR) Home Living/Prior Functioning Home Living Living Arrangements: Spouse/significant other Available Help at Discharge: Family Type of Home: House Home Access: Stairs to enter Technical brewer of Steps: 5-6 Entrance Stairs-Rails: Right;Left(can't reach at same time) Home Layout: Two level;Bed/bath upstairs Alternate Level Stairs-Number of Steps: 15 Alternate Level Stairs-Rails: Right(use when descending) Bathroom Shower/Tub: Multimedia programmer: Standard Bathroom Accessibility: No Additional Comments: Pt has half bath on main level however full bathroom on second floor   Lives With: Spouse Prior Function Level of Independence: Independent with basic ADLs;Independent with gait;Independent with homemaking with ambulation;Independent with transfers  Able to Take Stairs?: Yes Driving: Yes Vocation: Retired Leisure: Hobbies-yes (Comment)(yardwork; walking in mornings) 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, driving  Vision/Perception  Vision - Assessment Ocular Range of Motion: Within Functional Limits Tracking/Visual Pursuits: Decreased smoothness of eye movement to LEFT superior field;Decreased smoothness of eye movement to LEFT inferior field Saccades: Within functional limits Convergence: Within functional limits Perception Perception: Impaired Inattention/Neglect: Does not attend to left side of body(Inattention to L side of body ) Praxis Praxis: Intact  Cognition Overall Cognitive Status: Impaired/Different from baseline Arousal/Alertness: Awake/alert Orientation Level: Oriented X4 Attention: Sustained Sustained Attention: Appears intact Memory: Impaired Memory Impairment: Retrieval deficit Awareness: Impaired Awareness Impairment: Emergent impairment;Anticipatory impairment  Problem Solving: Impaired Problem Solving Impairment: Functional complex Executive Function: Self Correcting Self Correcting: Impaired Behaviors: Impulsive Safety/Judgment: Appears intact Sensation Sensation Light Touch: Impaired Detail Central sensation comments: Pt did not recognize any stimulus on dorsal or ventral side  Light Touch Impaired Details: Impaired LLE;Absent LUE Hot/Cold: Not tested Proprioception: Impaired Detail Proprioception Impaired Details: Absent LLE(absent at left hallux and ankle) Stereognosis: Not tested Stereognosis Impaired Details: Absent LUE Additional Comments: Intact RUE Coordination Gross Motor Movements are Fluid and Coordinated: No Fine Motor Movements are Fluid and Coordinated: No Coordination and Movement Description: Pt's gross and fine motor movements of LUE are uncoordinated and pt compensates with trunk to initiate movement of LUE  Finger Nose Finger Test: unable to complete due to weakness   Motor  Motor Motor: Hemiplegia Motor - Skilled Clinical Observations: Strong L lateral lean in sitting and standing with pusher tendencies of  R UE/LE   Mobility Bed Mobility Bed Mobility: Rolling Right;Sitting - Scoot to Edge of Bed;Right Sidelying to Sit Rolling Right: Minimal Assistance - Patient > 75%(1 handrail) Right Sidelying to Sit: Minimal Assistance - Patient > 75% Sitting - Scoot to Edge of Bed: Minimal Assistance - Patient > 75%(1 handrail) Transfers Transfers: Sit to Stand;Stand to Sit;Squat Pivot Transfers;Transfer via Geophysicist/field seismologist Sit to Stand: Maximal Assistance - Patient 25-49% Stand to Sit: Maximal Assistance - Patient 25-49% Squat Pivot Transfers: Maximal Assistance - Patient 25-49%;2 Archivist: Stedy(2 helpers) Locomotion  Gait Ambulation: Yes Gait Assistance: Total Assistance - Patient < 25% Gait Distance (Feet): 10 Feet Assistive device: Other (Comment)(w/c follow) Gait Assistance Details: Manual facilitation for weight bearing;Manual facilitation for placement;Visual cues/gestures for precautions/safety;Visual cues/gestures for sequencing;Verbal cues for sequencing;Verbal cues for technique;Verbal cues for precautions/safety;Verbal cues for gait pattern;Verbal cues for safe use of DME/AE Gait Assistance Details: 1 musketeer; 1 hallrail Gait Gait: Yes Gait Pattern: Impaired Gait Pattern: Decreased stride length;Decreased hip/knee flexion - left;Decreased dorsiflexion - left;Decreased weight shift to right;Decreased stance time - right;Decreased stance time - left;Left genu recurvatum;Lateral trunk lean to left;Decreased trunk rotation;Poor foot clearance - left;Poor foot clearance - right Stairs / Additional Locomotion Stairs: No Architect: Yes Wheelchair Assistance: Chartered loss adjuster: Right lower extremity;Right upper extremity Wheelchair Parts Management: Needs assistance Distance: 53'  Trunk/Postural Assessment  Cervical Assessment Cervical Assessment: Exceptions to WFL(forward head and flexed) Thoracic  Assessment Thoracic Assessment: Exceptions to WFL(rounded shoulders; kyphotic) Lumbar Assessment Lumbar Assessment: Exceptions to WFL(reduced lordosis; PPT) Postural Control Postural Control: Deficits on evaluation Trunk Control: L lateral lean Postural Limitations: kyphotic with forward and flexed head  Balance Balance Balance Assessed: Yes Static Sitting Balance Static Sitting - Balance Support: Right upper extremity supported;Feet supported Static Sitting - Level of Assistance: 5: Stand by assistance;4: Min assist(Sitting in w/c with L arm rest back- pt with L lateral leaning ) Dynamic Sitting Balance Dynamic Sitting - Balance Support: Feet supported Dynamic Sitting - Level of Assistance: 4: Min assist;3: Mod assist Dynamic Sitting Balance - Compensations: Seated in w/c performing LB dressing  Sitting balance - Comments: able to sit EOB with R UE support on bedrail ~32mn Static Standing Balance Static Standing - Balance Support: Right upper extremity supported;Left upper extremity supported Static Standing - Level of Assistance: 1: +2 Total assist Static Standing - Comment/# of Minutes: less than 1 minute- pt with notable L lateral leaning  Dynamic Standing Balance Dynamic Standing - Comments: not completed due to safety concerns  Extremity Assessment  RUE Assessment RUE Assessment: Within Functional  Limits General Strength Comments: 4/5    RLE Assessment RLE Assessment: Within Functional Limits General Strength Comments: grossly 5/5 RLE Strength Right Hip Flexion: 5/5 Right Hip ABduction: 5/5 Right Hip ADduction: 5/5 Right Knee Flexion: 5/5 Right Knee Extension: 5/5 Right Ankle Dorsiflexion: 5/5 Right Ankle Plantar Flexion: 5/5 LLE Assessment LLE Assessment: Exceptions to [facility] General Strength Comments: L hemiparesis LLE Strength LLE Overall Strength: Deficits Left Hip Flexion: 4-/5 Left Hip ABduction: 5/5 Left Hip ADduction: 5/5 Left Knee Flexion: 4/5 Left Knee  Extension: 3+/5 Left Ankle Dorsiflexion: 3-/5 Left Ankle Inversion: 3/5   See Function Navigator for Current Functional Status.   Refer to Care Plan for Long Term Goals  Recommendations for other services: None   Discharge Criteria: Patient will be discharged from PT if patient refuses treatment 3 consecutive times without medical reason, if treatment goals not met, if there is a change in medical status, if patient makes no progress towards goals or if patient is discharged from hospital.  The above assessment, treatment plan, treatment alternatives and goals were discussed and mutually agreed upon: by patient and by family  Martinique Everley Evora, SPT 03/18/2018, 8:00 AM

## 2018-03-19 ENCOUNTER — Inpatient Hospital Stay (HOSPITAL_COMMUNITY): Payer: Medicare Other | Admitting: Speech Pathology

## 2018-03-19 ENCOUNTER — Inpatient Hospital Stay (HOSPITAL_COMMUNITY): Payer: Medicare Other | Admitting: Occupational Therapy

## 2018-03-19 ENCOUNTER — Inpatient Hospital Stay (HOSPITAL_COMMUNITY): Payer: Medicare Other | Admitting: Physical Therapy

## 2018-03-19 DIAGNOSIS — I69398 Other sequelae of cerebral infarction: Secondary | ICD-10-CM

## 2018-03-19 DIAGNOSIS — R209 Unspecified disturbances of skin sensation: Secondary | ICD-10-CM

## 2018-03-19 LAB — GLUCOSE, CAPILLARY
Glucose-Capillary: 95 mg/dL (ref 70–99)
Glucose-Capillary: 97 mg/dL (ref 70–99)
Glucose-Capillary: 98 mg/dL (ref 70–99)
Glucose-Capillary: 108 mg/dL — ABNORMAL HIGH (ref 70–99)

## 2018-03-19 MED ORDER — TROLAMINE SALICYLATE 10 % EX CREA: TOPICAL_CREAM | Freq: Three times a day (TID) | CUTANEOUS | Status: DC

## 2018-03-19 MED ORDER — MUSCLE RUB 10-15 % EX CREA
TOPICAL_CREAM | Freq: Three times a day (TID) | CUTANEOUS | Status: DC
  Administered 2018-03-19 – 2018-03-23 (×18): via TOPICAL
  Administered 2018-03-24: 1 via TOPICAL
  Administered 2018-03-24: 06:00:00 via TOPICAL
  Administered 2018-03-24: 1 via TOPICAL
  Administered 2018-03-25 – 2018-04-09 (×49): via TOPICAL
  Administered 2018-04-09: 1 via TOPICAL
  Administered 2018-04-09: 18:00:00 via TOPICAL
  Administered 2018-04-09 – 2018-04-10 (×2): 1 via TOPICAL
  Administered 2018-04-10 – 2018-04-11 (×4): via TOPICAL
  Administered 2018-04-11: 1 via TOPICAL
  Administered 2018-04-12: 21:00:00 via TOPICAL
  Administered 2018-04-12: 1 via TOPICAL
  Administered 2018-04-13 – 2018-04-14 (×6): via TOPICAL
  Filled 2018-03-19 (×4): qty 85

## 2018-03-19 NOTE — Progress Notes (Signed)
[facility] PHYSICAL MEDICINE & REHABILITATION     PROGRESS NOTE  Subjective/Complaints:    ROS: Denies CP, S OB, nausea, vomiting, diarrhea.  Objective: Vital Signs: Blood pressure (!) 136/96, pulse 67, temperature 97.6 F (36.4 C), temperature source Oral, resp. rate 17, height 5\' 11"  (1.803 m), weight 101.3 kg, SpO2 99 %. No results found. Recent Labs    03/17/18 0237 03/18/18 0624  WBC 8.8 7.3  HGB 13.9 13.9  HCT 43.9 44.0  PLT 261 269   Recent Labs    03/17/18 0237 03/18/18 0624  NA 137 138  K 4.2 4.2  CL 104 108  GLUCOSE 106* 106*  BUN 18 18  CREATININE 0.90 0.91  CALCIUM 9.3 9.0   CBG (last 3)  Recent Labs    03/18/18 1654 03/18/18 2156 03/19/18 0704  GLUCAP 95 113* 95    Wt Readings from Last 3 Encounters:  03/17/18 101.3 kg  03/12/18 84.7 kg  11/11/17 86.6 kg    Physical Exam:  BP (!) 136/96 (BP Location: Right Arm)   Pulse 67   Temp 97.6 F (36.4 C) (Oral)   Resp 17   Ht 5\' 11"  (1.803 m)   Wt 101.3 kg   SpO2 99%   BMI 31.15 kg/m  Constitutional: He appears well-developed and well-nourished.  HENT: Normocephalic and atraumatic. Poor dentition  Eyes: EOM are normal.  No discharge.  Cardiovascular: RRR.  No JVD. Respiratory: Effort normal and breath sounds normal.  GI: Bowel sounds are normal. He exhibits no distension.  Musculoskeletal: No edema or tenderness in extremities  Neurological: He is alert and oriented Follows basic commands  Motor: RUE/RLE: 5/5 proximal to distal LUE: Shoulder abduction 2-/5, elbow flex/ext 2/5, hand grip 4/5 LLE: HF, KE, ADF 4/5 Absent sensation LT in LUE intact LLE Left facial droop Skin: Skin is warm and dry.  Psychiatric: He has a normal mood and affect. His behavior is normal. Thought content normal.   Assessment/Plan: 1. Functional deficits secondary to right thalamic hemorrhage which require 3+ hours per day of interdisciplinary therapy in a comprehensive inpatient rehab setting. Physiatrist is  providing close team supervision and 24 hour management of active medical problems listed below. Physiatrist and rehab team continue to assess barriers to discharge/monitor patient progress toward functional and medical goals.  Function:  Bathing Bathing position   Position: Wheelchair/chair at sink  Bathing parts Body parts bathed by patient: Left arm, Abdomen, Chest, Front perineal area, Right upper leg, Left upper leg Body parts bathed by helper: Buttocks, Back, Right arm, Right lower leg, Left lower leg  Bathing assist Assist Level: 2 helpers      Upper Body Dressing/Undressing Upper body dressing   What is the patient wearing?: Pull over shirt/dress     Pull over shirt/dress - Perfomed by patient: Thread/unthread right sleeve, Pull shirt over trunk, Put head through opening Pull over shirt/dress - Perfomed by helper: Thread/unthread left sleeve        Upper body assist Assist Level: Touching or steadying assistance(Pt > 75%)      Lower Body Dressing/Undressing Lower body dressing   What is the patient wearing?: Underwear, Pants, Non-skid slipper socks   Underwear - Performed by helper: Thread/unthread right underwear leg, Pull underwear up/down, Thread/unthread left underwear leg Pants- Performed by patient: Thread/unthread right pants leg Pants- Performed by helper: Thread/unthread left pants leg, Pull pants up/down   Non-skid slipper socks- Performed by helper: Don/doff right sock, Don/doff left sock  Lower body assist Assist for lower body dressing: 2 Helpers      Toileting Toileting     Toileting steps completed by helper: Adjust clothing prior to toileting, Performs perineal hygiene, Adjust clothing after toileting Toileting Assistive Devices: (standing in STEDY)  Toileting assist Assist level: Two helpers   Transfers Chair/bed transfer   Chair/bed transfer method: Squat pivot Chair/bed transfer assist level: 2 helpers Chair/bed  transfer assistive device: Armrests     Locomotion Ambulation     Max distance: 5' Assist level: 2 helpers   Wheelchair   Type: Manual Max wheelchair distance: 35' Assist Level: Touching or steadying assistance (Pt > 75%)  Cognition Comprehension Comprehension assist level: Understands basic 90% of the time/cues < 10% of the time  Expression Expression assist level: Expresses complex 90% of the time/cues < 10% of the time  Social Interaction Social Interaction assist level: Interacts appropriately with others with medication or extra time (anti-anxiety, antidepressant).  Problem Solving Problem solving assist level: Solves basic 75 - 89% of the time/requires cueing 10 - 24% of the time  Memory Memory assist level: Recognizes or recalls 75 - 89% of the time/requires cueing 10 - 24% of the time    Medical Problem List and Plan: 1.  Left-sided weakness secondary to right thalamic hemorrhage secondary to hypertensive crisis Cont CIR PT, OT, SLP 2.  DVT Prophylaxis/Anticoagulation: Subcutaneous heparin initiated 03/14/2018.  Monitor for any bleeding episodes, PLT 269K nl 3. Pain Management: Tylenol as needed 4. Mood: Provide emotional support 5. Neuropsych: This patient is capable of making decisions on his own behalf. 6. Skin/Wound Care: Routine skin checks 7. Fluids/Electrolytes/Nutrition: Routine in and outs   BMP within acceptable range on 9/26 8.  PAF with RVR.  Cardizem CD 180 mg daily, Lopressor 50 mg twice daily.  Cardiac rate controlled.  Follow-up cardiology services 9.  Diabetes mellitus. Hemoglobin A1c 6.4.  Glucophage 850 mg twice daily.  Check blood sugars before meals and at bedtime.   CBG (last 3)  Recent Labs    03/18/18 1654 03/18/18 2156 03/19/18 0704  GLUCAP 95 113* 95  controlled 9/27 10.  Hyperlipidemia.  Patient on Lipitor prior to admission 11.  Hypertension   Vitals:   03/18/18 2020 03/19/18 0457  BP: (!) 149/92 (!) 136/96  Pulse: 75 67  Resp:  17   Temp: 98.4 F (36.9 C) 97.6 F (36.4 C)  SpO2: 100% 99%  mild elevation of diastolic this am monitor  LOS (Days) 2 A FACE TO FACE EVALUATION WAS PERFORMED  Charlett Blake 03/19/2018 10:13 AM

## 2018-03-19 NOTE — Progress Notes (Signed)
Speech Language Pathology Daily Session Note  Patient Details  Name: MAXTON NOREEN MRN: [id number] Date of Birth: 07/23/1945  Today's Date: 03/19/2018 SLP Individual Time: 0730-0830 SLP Individual Time Calculation (min): 60 min  Short Term Goals: Week 1: SLP Short Term Goal 1 (Week 1): Pt will complete semi-complex cognitive tasks with supervision cues for functional problem solving.  SLP Short Term Goal 2 (Week 1): Pt will recall complex, new information with supervision cues for use of external aids.  SLP Short Term Goal 3 (Week 1): Pt will locate objects to the left of midline during functional tasks with supervision cues.    Skilled Therapeutic Interventions: Skilled treatment session focused on cognitive goals. SLP facilitated session by providing Min A verbal cues for recall of his current medications and their functions and for complex problem solving while organizing a BID pill box. Patient also required extra time and Min A verbal and visual cues for problem solving during a basic money management task. Patient left upright in bed with alarm on and all needs within reach. Continue with current plan of care.      Function:   Cognition Comprehension Comprehension assist level: Understands basic 90% of the time/cues < 10% of the time  Expression   Expression assist level: Expresses complex 90% of the time/cues < 10% of the time  Social Interaction Social Interaction assist level: Interacts appropriately with others with medication or extra time (anti-anxiety, antidepressant).  Problem Solving Problem solving assist level: Solves basic 75 - 89% of the time/requires cueing 10 - 24% of the time  Memory Memory assist level: Recognizes or recalls 75 - 89% of the time/requires cueing 10 - 24% of the time    Pain Pain Assessment Pain Score: 0-No pain  Therapy/Group: Individual Therapy  Dajion Bickford 03/19/2018, 11:51 AM

## 2018-03-19 NOTE — Progress Notes (Signed)
Physical Therapy Session Note  Patient Details  Name: Marcus Beasley MRN: [id number] Date of Birth: February 01, 1946  Today's Date: 03/19/2018 PT Individual Time: 1100-1210 PT Individual Time Calculation (min): 70 min   Short Term Goals: Week 1:  PT Short Term Goal 1 (Week 1): Pt will perform bed <> w/c transfers with modAx1 PT Short Term Goal 2 (Week 1): Pt will ambulate 89ft with modAx1 PT Short Term Goal 3 (Week 1): Pt will statically stand for 45min with midline orientation PT Short Term Goal 4 (Week 1): Pt will initiate stair training  Skilled Therapeutic Interventions/Progress Updates:    Pt received seated in w/c in room, agreeable to PT. Pt reports some soreness in L upper trap area, not rated. Attempt seated upper trap stretch, pt has no relief. PA aware and to order muscle rub for patient. Stand pivot transfer w/c to mat table with assist x 2. Seated balance EOM: forward reach with RUE outside BOS with mod A for trunk control, focus on decreased lean to the L and return to midline. Seated forward leans onto therapy ball with mod A to maintain trunk in midline. Seated crunches 2 x 10 reps from wedge to EOM with focus on engaging core musculature and decreased reliance on RUE. Sit to stand x 3 reps with max A to rail in hallway. Max A for standing balance with use of mirror for visual feedback, manual and visual cues for decreased lean to the L in standing. Manual cues through LLE for quad activation in standing, focus on knee control in standing and decreased hyperextension. Pt exhibits increased lean to the L when L knee unlocked. Assisted pt with use of urinal, setup assist, at end of session. Pt left seated in w/c in room with needs in reach, quick-release belt in place, family present.  Therapy Documentation Precautions:  Precautions Precautions: Fall Precaution Comments: L hemiparesis- L lateral lean sitting unsupported and in standing  Restrictions Weight Bearing Restrictions:  No  See Function Navigator for Current Functional Status.   Therapy/Group: Individual Therapy  Excell Seltzer, PT, DPT  03/19/2018, 4:11 PM

## 2018-03-19 NOTE — Progress Notes (Signed)
Occupational Therapy Session Note  Patient Details  Name: Marcus Beasley MRN: [id number] Date of Birth: 11-22-1945  Today's Date: 03/19/2018 OT Individual Time: 0845-1000 OT Individual Time Calculation (min): 75 min    Short Term Goals: Week 1:  OT Short Term Goal 1 (Week 1): Pt will maintain dynamic sitting balance on EOB/EOM with supervision in prep for functional tasks  OT Short Term Goal 2 (Week 1): Pt will complete UB dressing with min VC'ing OT Short Term Goal 3 (Week 1): Pt will perform toilet transfer with max A + 1 in order to decrease caregiver burden  OT Short Term Goal 4 (Week 1): Pt will thread BLEs into pants seated in w/c with CGA   Skilled Therapeutic Interventions/Progress Updates:    Pt supine in bed upon entry with no reports of pain with wife present for session. Pt transferred EOB, total A for advancement of L LE onto floor. Pt performed all functional transfers with use of STEDY + 2 this session with pt requiring min-mod A for obtaining standing position in lift and max VC'ing required for initiating and obtaining midline when in standing due to L lateral lean. Pt transferred to toilet req A for pulling pants down. Pt then transferred to [facility] in shower for bathing tasks. Pt able to complete bathing tasks while seated requiring assistance for buttocks and RUE. Therapist provided facilitation at elbow and wrist of LUE for bathing of RUE to facilitate functional movement patterns. Pt transferred to standard chair in front of sink and performed dressing tasks while seated, education provided regarding hemi technique with pt requiring increased time and HOH A to complete. Standing at sink with HOH A required to maintain BUE support at all times-pt unable due to flexor tone- max A + 2 for pulling up pants and maintaining upright standing position with max VC's required for knee and hip extension with therapist facilitating neutral position of L knee throughout. Pt transferred to w/c  and briefly educated on HEP for the weekend regarding self ROM exercises including shoulder flexion/extension and elbow flexion/extension, pt performed each x10. Education provided regarding actively moving L arm and using R arm to move L secondarily. Pt left in room with all needs in reach and wife present- chair belt set.   Therapy Documentation Precautions:  Precautions Precautions: Fall Precaution Comments: L hemiparesis- L lateral lean sitting unsupported and in standing  Restrictions Weight Bearing Restrictions: No  See Function Navigator for Current Functional Status.   Therapy/Group: Individual Therapy  Jeff Frieden 03/19/2018, 11:29 AM

## 2018-03-20 ENCOUNTER — Inpatient Hospital Stay (HOSPITAL_COMMUNITY): Payer: Medicare Other | Admitting: Speech Pathology

## 2018-03-20 ENCOUNTER — Inpatient Hospital Stay (HOSPITAL_COMMUNITY): Payer: Medicare Other | Admitting: Physical Therapy

## 2018-03-20 ENCOUNTER — Inpatient Hospital Stay (HOSPITAL_COMMUNITY): Payer: Medicare Other

## 2018-03-20 LAB — GLUCOSE, CAPILLARY
Glucose-Capillary: 98 mg/dL (ref 70–99)
Glucose-Capillary: 107 mg/dL — ABNORMAL HIGH (ref 70–99)
Glucose-Capillary: 117 mg/dL — ABNORMAL HIGH (ref 70–99)
Glucose-Capillary: 119 mg/dL — ABNORMAL HIGH (ref 70–99)

## 2018-03-20 NOTE — Progress Notes (Signed)
[place] PHYSICAL MEDICINE & REHABILITATION     PROGRESS NOTE  Subjective/Complaints:  Patient resting well.  ROS: Patient denies fever, rash, sore throat, blurred vision, nausea, vomiting, diarrhea, cough, shortness of breath or chest pain, joint or back pain, headache, or mood change.    Objective: Vital Signs: Blood pressure (!) 122/91, pulse 73, temperature 97.6 F (36.4 C), temperature source Oral, resp. rate 14, height 5\' 11"  (1.803 m), weight 101.3 kg, SpO2 99 %. No results found. Recent Labs    03/18/18 0624  WBC 7.3  HGB 13.9  HCT 44.0  PLT 269   Recent Labs    03/18/18 0624  NA 138  K 4.2  CL 108  GLUCOSE 106*  BUN 18  CREATININE 0.91  CALCIUM 9.0   CBG (last 3)  Recent Labs    03/19/18 1715 03/19/18 2116 03/20/18 0639  GLUCAP 98 108* 98    Wt Readings from Last 3 Encounters:  03/17/18 101.3 kg  03/12/18 84.7 kg  11/11/17 86.6 kg    Physical Exam:  BP (!) 122/91 (BP Location: Right Arm)   Pulse 73   Temp 97.6 F (36.4 C) (Oral)   Resp 14   Ht 5\' 11"  (1.803 m)   Wt 101.3 kg   SpO2 99%   BMI 31.15 kg/m  Constitutional: No distress . Vital signs reviewed. HEENT: EOMI, oral membranes moist Neck: supple Cardiovascular: RRR without murmur. No JVD    Respiratory: CTA Bilaterally without wheezes or rales. Normal effort    GI: BS +, non-tender, non-distended  Musculoskeletal: No edema or tenderness in extremities  Neurological: He is alert and oriented Follows basic commands  Motor: RUE/RLE: 5/5 proximal to distal LUE: Shoulder abduction 2-/5, elbow flex/ext 2/5, hand grip 4/5 LLE: HF, KE, ADF 4/5 Absent sensation LT in LUE intact LLE Left facial droop Skin: Skin is warm and dry.  Psychiatric: He has a normal mood and affect. His behavior is normal. Thought content normal.   Assessment/Plan: 1. Functional deficits secondary to right thalamic hemorrhage which require 3+ hours per day of interdisciplinary therapy in a comprehensive  inpatient rehab setting. Physiatrist is providing close team supervision and 24 hour management of active medical problems listed below. Physiatrist and rehab team continue to assess barriers to discharge/monitor patient progress toward functional and medical goals.  Function:  Bathing Bathing position   Position: Shower  Bathing parts Body parts bathed by patient: Left arm, Chest, Abdomen, Front perineal area, Buttocks, Right upper leg, Left upper leg, Right lower leg Body parts bathed by helper: Right arm, Left lower leg, Back  Bathing assist Assist Level: 2 helpers      Upper Body Dressing/Undressing Upper body dressing   What is the patient wearing?: Pull over shirt/dress     Pull over shirt/dress - Perfomed by patient: Thread/unthread right sleeve, Put head through opening, Pull shirt over trunk Pull over shirt/dress - Perfomed by helper: Thread/unthread left sleeve        Upper body assist Assist Level: Touching or steadying assistance(Pt > 75%)      Lower Body Dressing/Undressing Lower body dressing   What is the patient wearing?: Pants, Non-skid slipper socks   Underwear - Performed by helper: Thread/unthread right underwear leg, Pull underwear up/down, Thread/unthread left underwear leg Pants- Performed by patient: Thread/unthread right pants leg Pants- Performed by helper: Thread/unthread left pants leg, Pull pants up/down   Non-skid slipper socks- Performed by helper: Don/doff left sock, Don/doff right sock  Lower body assist Assist for lower body dressing: 2 Helpers      Toileting Toileting   Toileting steps completed by patient: Performs perineal hygiene Toileting steps completed by helper: Adjust clothing prior to toileting, Adjust clothing after toileting Toileting Assistive Devices: Grab bar or rail  Toileting assist Assist level: Two helpers   Transfers Chair/bed transfer   Chair/bed transfer method: Stand pivot Chair/bed  transfer assist level: 2 helpers Chair/bed transfer assistive device: Mechanical lift Mechanical lift: Ecologist     Max distance: 5' Assist level: 2 helpers   Wheelchair   Type: Manual Max wheelchair distance: 58' Assist Level: Touching or steadying assistance (Pt > 75%)  Cognition Comprehension Comprehension assist level: Understands basic 90% of the time/cues < 10% of the time  Expression Expression assist level: Expresses complex 90% of the time/cues < 10% of the time  Social Interaction Social Interaction assist level: Interacts appropriately with others with medication or extra time (anti-anxiety, antidepressant).  Problem Solving Problem solving assist level: Solves basic 75 - 89% of the time/requires cueing 10 - 24% of the time  Memory Memory assist level: Recognizes or recalls 75 - 89% of the time/requires cueing 10 - 24% of the time    Medical Problem List and Plan: 1.  Left-sided weakness secondary to right thalamic hemorrhage secondary to hypertensive crisis Cont CIR PT, OT, SLP 2.  DVT Prophylaxis/Anticoagulation: Subcutaneous heparin initiated 03/14/2018.  Monitor for any bleeding episodes, PLT 269K   3. Pain Management: Tylenol as needed 4. Mood: Provide emotional support 5. Neuropsych: This patient is capable of making decisions on his own behalf. 6. Skin/Wound Care: Routine skin checks 7. Fluids/Electrolytes/Nutrition: Routine in and outs   BMP within acceptable range on 9/26 8.  PAF with RVR.  Cardizem CD 180 mg daily, Lopressor 50 mg twice daily.  Cardiac rate controlled.  Follow-up cardiology services 9.  Diabetes mellitus. Hemoglobin A1c 6.4.  Glucophage 850 mg twice daily.  Check blood sugars before meals and at bedtime.   CBG (last 3)  Recent Labs    03/19/18 1715 03/19/18 2116 03/20/18 0639  GLUCAP 98 108* 98  controlled 9/28 10.  Hyperlipidemia.  Patient on Lipitor prior to admission 11.  Hypertension   Vitals:   03/19/18 1945  03/20/18 0513  BP: (!) 134/93 (!) 122/91  Pulse: 83 73  Resp: 20 14  Temp: 97.6 F (36.4 C) 97.6 F (36.4 C)  SpO2: 99% 99%  mild elevation of diastolic continues.  Avoid overtreatment  LOS (Days) 3 A FACE TO FACE EVALUATION WAS PERFORMED  Meredith Staggers 03/20/2018 7:48 AM

## 2018-03-20 NOTE — Progress Notes (Signed)
Occupational Therapy Session Note  Patient Details  Name: Marcus Beasley MRN: [id number] Date of Birth: 1945-11-19  Today's Date: 03/20/2018 OT Individual Time: 0945-1100 Session 2: 1400-1430 OT Individual Time Calculation (min): 75 min  Session 2: 30 min    Short Term Goals: Week 1:  OT Short Term Goal 1 (Week 1): Pt will maintain dynamic sitting balance on EOB/EOM with supervision in prep for functional tasks  OT Short Term Goal 2 (Week 1): Pt will complete UB dressing with min VC'ing OT Short Term Goal 3 (Week 1): Pt will perform toilet transfer with max A + 1 in order to decrease caregiver burden  OT Short Term Goal 4 (Week 1): Pt will thread BLEs into pants seated in w/c with CGA   Skilled Therapeutic Interventions/Progress Updates:    Session focused on b/d tasks at shower level. Pt completed sit to stand in stedy with CGA throughout session. Occasional manual facilitation provided to position L UE on stedy bar but pt able to maintain grasp. Pt required mod-max standing balance assist when completing standing level peri hygiene/clothing management in stedy. Throughout session pt with heavy left lean, requiring verbal, visual, and tactile cues to self correct. Pt left sitting up in w/c with all needs met.   Session 2: Session focused on ADL transfers and standing level toileting tasks. Pt completed stand pivot transfer x3 with mod A toward R side. Pt stood from w/c with mod A, when R UE was not supported on grab bar pt required mod-max A to maintain static standing balance. Heavy vc and tactile cues to correct L lean and maintain upright posture. Pt returned to bed with bed alarm set and all needs met.   Therapy Documentation Precautions:  Precautions Precautions: Fall Precaution Comments: L hemiparesis- L lateral lean sitting unsupported and in standing  Restrictions Weight Bearing Restrictions: No  Pain: Pain Assessment Pain Scale: 0-10 Pain Score: 0-No pain ADL: ADL Upper  Body Bathing: Minimal cueing, Minimal assistance Where Assessed-Upper Body Bathing: Shower Lower Body Bathing: Minimal cueing, Moderate assistance Where Assessed-Lower Body Bathing: Shower Upper Body Dressing: Moderate cueing, Moderate assistance Where Assessed-Upper Body Dressing: Edge of bed Lower Body Dressing: Maximal assistance, Moderate cueing Where Assessed-Lower Body Dressing: Edge of bed Toileting: Moderate assistance, Other (Comment)(standing in stedy) Where Assessed-Toileting: Glass blower/designer: Moderate verbal cueing, Dependent Toilet Transfer Method: Other (comment)(STEDY) Toilet Transfer Equipment: Raised toilet seat, Grab bars Tub/Shower Transfer: Dependent Tub/Shower Equipment: Shower seat with back  See Function Navigator for Current Functional Status.   Therapy/Group: Individual Therapy  Curtis Sites 03/20/2018, 11:07 AM

## 2018-03-20 NOTE — Progress Notes (Signed)
Speech Language Pathology Daily Session Note  Patient Details  Name: IASIAH OZMENT MRN: [id number] Date of Birth: [birth year]  Today's Date: 03/20/2018 SLP Individual Time: 1140-1220 SLP Individual Time Calculation (min): 40 min  Short Term Goals: Week 1: SLP Short Term Goal 1 (Week 1): Pt will complete semi-complex cognitive tasks with supervision cues for functional problem solving.  SLP Short Term Goal 2 (Week 1): Pt will recall complex, new information with supervision cues for use of external aids.  SLP Short Term Goal 3 (Week 1): Pt will locate objects to the left of midline during functional tasks with supervision cues.    Skilled Therapeutic Interventions: Skilled treatment session focused on cognition goals. SLP facilitated session by providing supervision cues for semi-complex problem solving within context of creating list of items from grocery store ad given money constraint, number of items and number of people to feed. Pt required Min A to locate/write items to left of midline. Pt was returned to room, left upright in wheelchair, seatbelt alarm on and family present.         Pain Pain Assessment Pain Scale: 0-10 Pain Score: 0-No pain  Therapy/Group: Individual Therapy  Noura Purpura 03/20/2018, 12:59 PM

## 2018-03-20 NOTE — Progress Notes (Signed)
Physical Therapy Session Note  Patient Details  Name: Marcus Beasley MRN: [id number] Date of Birth: 27-Jan-1946  Today's Date: 03/20/2018 PT Individual Time: 1505-1600 PT Individual Time Calculation (min): 55 min   Short Term Goals: Week 1:  PT Short Term Goal 1 (Week 1): Pt will perform bed <> w/c transfers with modAx1 PT Short Term Goal 2 (Week 1): Pt will ambulate 36ft with modAx1 PT Short Term Goal 3 (Week 1): Pt will statically stand for 73min with midline orientation PT Short Term Goal 4 (Week 1): Pt will initiate stair training  Skilled Therapeutic Interventions/Progress Updates:   Pt in supine and agreeable to therapy, no c/o pain. Pt transferred to EOB w/ min assist and to w/c via squat pivot towards R side, max assist. Worked on w/c propulsion to/from therapy gym for increased independence w/ locomotion. Pt self-propelled w/c via R hemi technique w/ max verbal, visual, and tactile cues for technique, sequencing, and obstacle negotiation; however supervision level manual assist. Switched to different w/c as his brakes would not hold safely during transfers and new chair at higher height for improved mechanics w/ sit<>stands. Performed multiple sit<>stands from w/c in parallel bars, mod assist w/ tactile and verbal cues for posture, anterior weight shifting, and technique. Worked on L knee control in static stance, minimizing hyperextension. Transitioned to supine and performed LLE exercises to work on knee musculature activation and knee proprioception. Exercises included active assisted hip flexion and heel slides, instructed pt on how to perform in supine outside of therapy to work on muscle activation. Returned to room via w/c, ended session in supine w/ all needs in reach.   Therapy Documentation Precautions:  Precautions Precautions: Fall Precaution Comments: L hemiparesis- L lateral lean sitting unsupported and in standing  Restrictions Weight Bearing Restrictions: No Vital  Signs: Therapy Vitals Temp: 97.8 F (36.6 C) Temp Source: Oral Pulse Rate: 83 Resp: 20 BP: 128/89 Patient Position (if appropriate): Lying Oxygen Therapy SpO2: 97 % O2 Device: Room Air  See Function Navigator for Current Functional Status.   Therapy/Group: Individual Therapy  Elara Cocke K Lexa Coronado 03/20/2018, 4:04 PM

## 2018-03-21 ENCOUNTER — Inpatient Hospital Stay (HOSPITAL_COMMUNITY): Payer: Medicare Other | Admitting: Occupational Therapy

## 2018-03-21 LAB — GLUCOSE, CAPILLARY
Glucose-Capillary: 100 mg/dL — ABNORMAL HIGH (ref 70–99)
Glucose-Capillary: 97 mg/dL (ref 70–99)
Glucose-Capillary: 108 mg/dL — ABNORMAL HIGH (ref 70–99)
Glucose-Capillary: 114 mg/dL — ABNORMAL HIGH (ref 70–99)

## 2018-03-21 NOTE — Progress Notes (Signed)
[place] PHYSICAL MEDICINE & REHABILITATION     PROGRESS NOTE  Subjective/Complaints:  No new problems. Happy with therapy progress  ROS: Patient denies fever, rash, sore throat, blurred vision, nausea, vomiting, diarrhea, cough, shortness of breath or chest pain, joint or back pain, headache, or mood change.   Objective: Vital Signs: Blood pressure (!) 130/96, pulse 67, temperature 98 F (36.7 C), temperature source Oral, resp. rate 16, height 5\' 11"  (1.803 m), weight 101.3 kg, SpO2 96 %. No results found. No results for input(s): WBC, HGB, HCT, PLT in the last 72 hours. No results for input(s): NA, K, CL, GLUCOSE, BUN, CREATININE, CALCIUM in the last 72 hours.  Invalid input(s): CO CBG (last 3)  Recent Labs    03/20/18 1643 03/20/18 2112 03/21/18 0636  GLUCAP 117* 119* 100*    Wt Readings from Last 3 Encounters:  03/17/18 101.3 kg  03/12/18 84.7 kg  11/11/17 86.6 kg    Physical Exam:  BP (!) 130/96 (BP Location: Right Arm)   Pulse 67   Temp 98 F (36.7 C) (Oral)   Resp 16   Ht 5\' 11"  (1.803 m)   Wt 101.3 kg   SpO2 96%   BMI 31.15 kg/m  Constitutional: No distress . Vital signs reviewed. HEENT: EOMI, oral membranes moist Neck: supple Cardiovascular: RRR without murmur. No JVD    Respiratory: CTA Bilaterally without wheezes or rales. Normal effort    GI: BS +, non-tender, non-distended  Musculoskeletal: No edema or tenderness in extremities  Neurological: He is alert and oriented Follows basic commands  Motor: RUE/RLE: 5/5 proximal to distal LUE: Shoulder abduction 2-/5, elbow flex/ext 2/5, hand grip 4/5 LLE: HF, KE, ADF 4/5 Absent sensation LT in LUE intact LLE Left facial droop Skin: Skin is warm and dry.  Psychiatric: He has a normal mood and affect. His behavior is normal. Thought content normal.   Assessment/Plan: 1. Functional deficits secondary to right thalamic hemorrhage which require 3+ hours per day of interdisciplinary therapy in a  comprehensive inpatient rehab setting. Physiatrist is providing close team supervision and 24 hour management of active medical problems listed below. Physiatrist and rehab team continue to assess barriers to discharge/monitor patient progress toward functional and medical goals.  Function:  Bathing Bathing position   Position: Shower  Bathing parts Body parts bathed by patient: Left arm, Chest, Abdomen, Front perineal area, Buttocks, Right upper leg, Left upper leg, Right lower leg Body parts bathed by helper: Right arm, Left lower leg, Back  Bathing assist Assist Level: 2 helpers      Upper Body Dressing/Undressing Upper body dressing   What is the patient wearing?: Pull over shirt/dress     Pull over shirt/dress - Perfomed by patient: Thread/unthread right sleeve, Put head through opening, Pull shirt over trunk Pull over shirt/dress - Perfomed by helper: Thread/unthread left sleeve        Upper body assist Assist Level: Touching or steadying assistance(Pt > 75%)      Lower Body Dressing/Undressing Lower body dressing   What is the patient wearing?: Pants, Non-skid slipper socks   Underwear - Performed by helper: Thread/unthread right underwear leg, Pull underwear up/down, Thread/unthread left underwear leg Pants- Performed by patient: Thread/unthread right pants leg Pants- Performed by helper: Thread/unthread left pants leg, Pull pants up/down   Non-skid slipper socks- Performed by helper: Don/doff left sock, Don/doff right sock                  Lower body assist  Assist for lower body dressing: 2 Helpers      Toileting Toileting   Toileting steps completed by patient: Performs perineal hygiene Toileting steps completed by helper: Adjust clothing prior to toileting, Adjust clothing after toileting Toileting Assistive Devices: Grab bar or rail  Toileting assist Assist level: Two helpers   Transfers Chair/bed transfer   Chair/bed transfer method: Squat  pivot Chair/bed transfer assist level: 2 helpers Chair/bed transfer assistive device: Mechanical lift Mechanical lift: Ecologist     Max distance: 5' Assist level: 2 helpers   Wheelchair   Type: Manual Max wheelchair distance: 150' Assist Level: Supervision or verbal cues  Cognition Comprehension Comprehension assist level: Understands basic 90% of the time/cues < 10% of the time  Expression Expression assist level: Expresses complex 90% of the time/cues < 10% of the time  Technical sales engineer Social Interaction assist level: Interacts appropriately with others with medication or extra time (anti-anxiety, antidepressant).  Problem Solving Problem solving assist level: Solves basic 75 - 89% of the time/requires cueing 10 - 24% of the time  Memory Memory assist level: Recognizes or recalls 75 - 89% of the time/requires cueing 10 - 24% of the time    Medical Problem List and Plan: 1.  Left-sided weakness secondary to right thalamic hemorrhage secondary to hypertensive crisis Cont CIR PT, OT, SLP 2.  DVT Prophylaxis/Anticoagulation: Subcutaneous heparin initiated 03/14/2018.  Monitor for any bleeding episodes, PLT 269K   3. Pain Management: Tylenol as needed 4. Mood: Provide emotional support 5. Neuropsych: This patient is capable of making decisions on his own behalf. 6. Skin/Wound Care: Routine skin checks 7. Fluids/Electrolytes/Nutrition: Routine in and outs   BMP within acceptable range on 9/26 8.  PAF with RVR.  Cardizem CD 180 mg daily, Lopressor 50 mg twice daily.  Cardiac rate controlled.  Follow-up cardiology services 9.  Diabetes mellitus. Hemoglobin A1c 6.4.  Glucophage 850 mg twice daily.  Check blood sugars before meals and at bedtime.   CBG (last 3)  Recent Labs    03/20/18 1643 03/20/18 2112 03/21/18 0636  GLUCAP 117* 119* 100*  controlled 9/29 10.  Hyperlipidemia.  Patient on Lipitor prior to admission 11.  Hypertension   Vitals:   03/20/18  2015 03/21/18 0510  BP: 119/81 (!) 130/96  Pulse: 78 67  Resp: 16 16  Temp: 98.2 F (36.8 C) 98 F (36.7 C)  SpO2: 96% 96%  -tolerate mild elevation of diastolic BP.  Avoid overtreatment  LOS (Days) 4 A FACE TO FACE EVALUATION WAS PERFORMED  Meredith Staggers 03/21/2018 7:34 AM

## 2018-03-21 NOTE — Progress Notes (Signed)
Occupational Therapy Session Note  Patient Details  Name: Marcus Beasley MRN: [id number] Date of Birth: December 27, 1945  Today's Date: 03/21/2018 OT Group Time: Missed 60 minutes    Skilled Therapeutic Interventions/Progress Updates:    Pt declined getting OOB for group therapy. 60 minutes missed.   Therapy Documentation Precautions:  Precautions Precautions: Fall Precaution Comments: L hemiparesis- L lateral lean sitting unsupported and in standing  Restrictions Weight Bearing Restrictions: No Vital Signs: Therapy Vitals Temp: 98 F (36.7 C) Temp Source: Oral Pulse Rate: 67 Resp: 16 BP: (!) 130/96 Patient Position (if appropriate): Lying Oxygen Therapy SpO2: 96 % O2 Device: Room Air Pain: Pain Assessment Pain Score: Asleep ADL: ADL Upper Body Bathing: Minimal cueing, Minimal assistance Where Assessed-Upper Body Bathing: Shower Lower Body Bathing: Minimal cueing, Moderate assistance Where Assessed-Lower Body Bathing: Shower Upper Body Dressing: Moderate cueing, Moderate assistance Where Assessed-Upper Body Dressing: Edge of bed Lower Body Dressing: Maximal assistance, Moderate cueing Where Assessed-Lower Body Dressing: Edge of bed Toileting: Moderate assistance, Other (Comment)(standing in stedy) Where Assessed-Toileting: Glass blower/designer: Moderate verbal cueing, Dependent Toilet Transfer Method: Other (comment)(STEDY) Toilet Transfer Equipment: Raised toilet seat, Grab bars Tub/Shower Transfer: Dependent Tub/Shower Equipment: Shower seat with back     See Function Navigator for Current Functional Status.   Therapy/Group: Group Therapy  Etienne Millward A Trevonne Nyland 03/21/2018, 7:22 AM

## 2018-03-22 ENCOUNTER — Inpatient Hospital Stay (HOSPITAL_COMMUNITY): Payer: Medicare Other | Admitting: Physical Therapy

## 2018-03-22 ENCOUNTER — Inpatient Hospital Stay (HOSPITAL_COMMUNITY): Payer: Medicare Other

## 2018-03-22 ENCOUNTER — Inpatient Hospital Stay (HOSPITAL_COMMUNITY): Payer: Medicare Other | Admitting: Occupational Therapy

## 2018-03-22 DIAGNOSIS — I480 Paroxysmal atrial fibrillation: Secondary | ICD-10-CM

## 2018-03-22 LAB — GLUCOSE, CAPILLARY
Glucose-Capillary: 99 mg/dL (ref 70–99)
Glucose-Capillary: 101 mg/dL — ABNORMAL HIGH (ref 70–99)
Glucose-Capillary: 110 mg/dL — ABNORMAL HIGH (ref 70–99)
Glucose-Capillary: 114 mg/dL — ABNORMAL HIGH (ref 70–99)

## 2018-03-22 NOTE — Progress Notes (Signed)
Occupational Therapy Session Note  Patient Details  Name: Marcus Beasley MRN: [id number] Date of Birth: 1946-04-02  Today's Date: 03/22/2018 OT Individual Time: 2951-8841 OT Individual Time Calculation (min): 60 min    Short Term Goals: Week 1:  OT Short Term Goal 1 (Week 1): Pt will maintain dynamic sitting balance on EOB/EOM with supervision in prep for functional tasks  OT Short Term Goal 2 (Week 1): Pt will complete UB dressing with min VC'ing OT Short Term Goal 3 (Week 1): Pt will perform toilet transfer with max A + 1 in order to decrease caregiver burden  OT Short Term Goal 4 (Week 1): Pt will thread BLEs into pants seated in w/c with CGA   Skilled Therapeutic Interventions/Progress Updates:    Pt supine in bed upon entry with no reports of pain and eager to shower. Squat pivot transfer EOB > w/c with mod A + 1 with pt req max VC's for hand and foot placement. Stand pivot transfer w/c <> standard toilet with mod A + 2 due to L lateral leaning and difficulty maintaining midline. STEDY used rest of session with CGA-min A throughout for obtaining standing position with focus on maintaining midline position when seated and standing in STEDY. Pt able to perform periarea care however therapist needed for thoroughness with pt also requiring max VC's for maintaining midline in standing. Pt performed all aspects of bathing while seated in shower req facilitation at L elbow and HOH A for bathing of R UE. During shower, pt also required min VC's for attending to LUE with pt not able to recognize when UE had fallen to side of bench. Pt returned to w/c, performed dressing tasks with education provided regarding hemi technique. Pt attempting to thread LUE into shirt however unable to thread requiring therapist to set up shirt and instruct pt to place hand into arm of shirt for smaller target. Same method used for LLE into pants. Pt then attempting to use urinal while seated but unable- STEDY used for  standing position for urinal use with increased difficulty to maintain balance while holding urinal with RUE requiring assistance from therapist for placement of urinal. Pt returned to w/c and left in room seated in w/c at sink with setup for brushing teeth with handoff from SLP.   Therapy Documentation Precautions:  Precautions Precautions: Fall Precaution Comments: L hemiparesis- L lateral lean sitting unsupported and in standing  Restrictions Weight Bearing Restrictions: No  See Function Navigator for Current Functional Status.   Therapy/Group: Individual Therapy  Jnai Snellgrove 03/22/2018, 3:10 PM

## 2018-03-22 NOTE — Progress Notes (Signed)
Physical Therapy Session Note  Patient Details  Name: Marcus Beasley MRN: [id number] Date of Birth: Oct 30, 1945  Today's Date: 03/22/2018 PT Individual Time: 1415-1555 PT Individual Time Calculation (min): 100 min   Short Term Goals: Week 1:  PT Short Term Goal 1 (Week 1): Pt will perform bed <> w/c transfers with modAx1 PT Short Term Goal 2 (Week 1): Pt will ambulate 64ft with modAx1 PT Short Term Goal 3 (Week 1): Pt will statically stand for 21min with midline orientation PT Short Term Goal 4 (Week 1): Pt will initiate stair training  Skilled Therapeutic Interventions/Progress Updates: Pt received in bed, denies pain and agreeable to treatment. Supine>sit with S. MaxA squat pivot to w/c towards L side, cues for R anterior weight shift and head/hips relationship. Stand pivot maxA w/c <>toilet using grab bar; strong L lean/push in sitting and standing, requiring totalA for balance while performing hygiene and clothing management with RUE. Gait x2 trials at 10-15' each with maxA, mirror visual feedback for postural alignment, repetitive external cueing for R trunk towards wall, assist for LLE placement and stance control to reduce buckling/recurvatum. Seated R reaching with RUE, wedge under L hip to facilitate L trunk shortening, R trunk lengthening. Supine>prone>quadruped with maxA +2; performed tall kneeling with BUE support on bench, again requires multimodal cueing for R weight shifting, reduced pushing with R extremities. Supine LLE PNF D2 flexion/extension with rhythmic initiation and use of quick stretch to initiate movement in mid range. Returned to room totalA; setup with urinal for use. Remained in w/c, chair lap belt alarm intact, all needs in reach at end of session.      Therapy Documentation Precautions:  Precautions Precautions: Fall Precaution Comments: L hemiparesis- L lateral lean sitting unsupported and in standing  Restrictions Weight Bearing Restrictions: No   See  Function Navigator for Current Functional Status.   Therapy/Group: Individual Therapy  Corliss Skains 03/22/2018, 4:05 PM

## 2018-03-22 NOTE — Plan of Care (Signed)
  Problem: RH BLADDER ELIMINATION Goal: RH STG MANAGE BLADDER WITH ASSISTANCE Description STG Manage Bladder With Assistance. Mod I  Outcome: Progressing Flowsheets (Taken 03/22/2018 1506) STG: Pt will manage bladder with assistance: 5-Supervision/set up   Problem: RH SAFETY Goal: RH STG ADHERE TO SAFETY PRECAUTIONS W/ASSISTANCE/DEVICE Description STG Adhere to Safety Precautions With Assistance/Device. Mod I  Outcome: Progressing Flowsheets (Taken 03/22/2018 1506) STG:Pt will adhere to safety precautions with assistance/device: 3-Moderate assistance   Problem: RH PAIN MANAGEMENT Goal: RH STG PAIN MANAGED AT OR BELOW PT'S PAIN GOAL Description Less than 3   Outcome: Progressing   Problem: RH KNOWLEDGE DEFICIT Goal: RH STG INCREASE KNOWLEDGE OF DIABETES Description Patient will be able to describe diet and medication to manage DM with cues.  Outcome: Progressing Goal: RH STG INCREASE KNOWLEDGE OF HYPERTENSION Description Patient will be able to describe management of hypertension with diet and medication. Patient will be able to describe target blood pressure range and how to check blood pressure. Cues.  Outcome: Progressing

## 2018-03-22 NOTE — Progress Notes (Signed)
Physical Therapy Session Note  Patient Details  Name: Marcus Beasley MRN: [id number] Date of Birth: 1946-01-24  Today's Date: 03/22/2018 PT Individual Time: 1100-1158 PT Individual Time Calculation (min): 58 min  MAKE UP TIME  Short Term Goals: Week 1:  PT Short Term Goal 1 (Week 1): Pt will perform bed <> w/c transfers with modAx1 PT Short Term Goal 2 (Week 1): Pt will ambulate 4ft with modAx1 PT Short Term Goal 3 (Week 1): Pt will statically stand for 12min with midline orientation PT Short Term Goal 4 (Week 1): Pt will initiate stair training  Skilled Therapeutic Interventions/Progress Updates:    pt performs squat pivot transfers throughout session to Rt side with mod A.  W/c mobility with hemi technique with supervision, min/mod cues for attention to Lt side to avoid obstacles.  Standing balance in parallel bars with wt shifts and with reaching task with pt able to stand without UE support with max A. Max manual facilitation for wt shift to Lt and to prevent Lt knee hyperextension.  Manual facilitation for upright posture and Lt UE positioning. Supine NMR with bridging, PNFs and LTR with ball squeeze with increased time and cuing for midline trunk and LE.  Partial sit <> stand with focus on midline orientation with max manual and verbal cues.  Pt left in w/c with alarm set, needs at hand.  Therapy Documentation Precautions:  Precautions Precautions: Fall Precaution Comments: L hemiparesis- L lateral lean sitting unsupported and in standing  Restrictions Weight Bearing Restrictions: No Pain: Pain Assessment Pain Score: 0-No pain     Therapy/Group: Individual Therapy  Runette Scifres 03/22/2018, 11:59 AM

## 2018-03-22 NOTE — Progress Notes (Signed)
[facility] PHYSICAL MEDICINE & REHABILITATION     PROGRESS NOTE  Subjective/Complaints:  Patient seen lying in bed this morning.  He states he slept well overnight.  He states he had a good weekend and enjoyed dancing with therapies yesterday.  ROS: Denies CP, SOB, N/V/D  Objective: Vital Signs: Blood pressure 139/88, pulse 68, temperature 97.8 F (36.6 C), temperature source Oral, resp. rate 19, height 5\' 11"  (1.803 m), weight 101.3 kg, SpO2 98 %. No results found. No results for input(s): WBC, HGB, HCT, PLT in the last 72 hours. No results for input(s): NA, K, CL, GLUCOSE, BUN, CREATININE, CALCIUM in the last 72 hours.  Invalid input(s): CO CBG (last 3)  Recent Labs    03/21/18 1636 03/21/18 2136 03/22/18 0655  GLUCAP 108* 114* 99    Wt Readings from Last 3 Encounters:  03/17/18 101.3 kg  03/12/18 84.7 kg  11/11/17 86.6 kg    Physical Exam:  BP 139/88 (BP Location: Right Arm)   Pulse 68   Temp 97.8 F (36.6 C) (Oral)   Resp 19   Ht 5\' 11"  (1.803 m)   Wt 101.3 kg   SpO2 98%   BMI 31.15 kg/m  Constitutional: No distress . Vital signs reviewed. HENT: Normocephalic.  Atraumatic. Eyes: EOMI. No discharge. Cardiovascular: RRR. No JVD. Respiratory: CTA Bilaterally. Normal effort. GI: BS +. Non-distended. Musc: No edema or tenderness in extremities. Neurological: He is alert and oriented Follows basic commands  Motor: RUE/RLE: 5/5 proximal to distal LUE: Shoulder abduction 2-/5, elbow flex/ext 2/5, hand grip 4/5, improving LLE: HF, KE, ADF 4/5, stable Left facial droop Skin: Skin is warm and dry.  Psychiatric: He has a normal mood and affect. His behavior is normal. Thought content normal.   Assessment/Plan: 1. Functional deficits secondary to right thalamic hemorrhage which require 3+ hours per day of interdisciplinary therapy in a comprehensive inpatient rehab setting. Physiatrist is providing close team supervision and 24 hour management of active medical  problems listed below. Physiatrist and rehab team continue to assess barriers to discharge/monitor patient progress toward functional and medical goals.  Function:  Bathing Bathing position   Position: Shower  Bathing parts Body parts bathed by patient: Left arm, Chest, Abdomen, Front perineal area, Buttocks, Right upper leg, Left upper leg, Right lower leg Body parts bathed by helper: Right arm, Left lower leg, Back  Bathing assist Assist Level: 2 helpers      Upper Body Dressing/Undressing Upper body dressing   What is the patient wearing?: Pull over shirt/dress     Pull over shirt/dress - Perfomed by patient: Thread/unthread right sleeve, Put head through opening, Pull shirt over trunk Pull over shirt/dress - Perfomed by helper: Thread/unthread left sleeve        Upper body assist Assist Level: Touching or steadying assistance(Pt > 75%)      Lower Body Dressing/Undressing Lower body dressing   What is the patient wearing?: Pants, Non-skid slipper socks   Underwear - Performed by helper: Thread/unthread right underwear leg, Pull underwear up/down, Thread/unthread left underwear leg Pants- Performed by patient: Thread/unthread right pants leg Pants- Performed by helper: Thread/unthread left pants leg, Pull pants up/down   Non-skid slipper socks- Performed by helper: Don/doff left sock, Don/doff right sock                  Lower body assist Assist for lower body dressing: 2 Helpers      Toileting Toileting   Toileting steps completed by patient: Performs perineal  hygiene Toileting steps completed by helper: Adjust clothing prior to toileting, Adjust clothing after toileting Toileting Assistive Devices: Grab bar or rail  Toileting assist Assist level: Two helpers   Transfers Chair/bed transfer   Chair/bed transfer method: Squat pivot Chair/bed transfer assist level: 2 helpers Chair/bed transfer assistive device: Mechanical lift Mechanical lift: Press photographer     Max distance: 5' Assist level: 2 helpers   Wheelchair   Type: Manual Max wheelchair distance: 150' Assist Level: Supervision or verbal cues  Cognition Comprehension Comprehension assist level: Understands basic 90% of the time/cues < 10% of the time  Expression Expression assist level: Expresses complex 90% of the time/cues < 10% of the time  Technical sales engineer Social Interaction assist level: Interacts appropriately with others with medication or extra time (anti-anxiety, antidepressant).  Problem Solving Problem solving assist level: Solves basic 75 - 89% of the time/requires cueing 10 - 24% of the time  Memory Memory assist level: Recognizes or recalls 75 - 89% of the time/requires cueing 10 - 24% of the time    Medical Problem List and Plan: 1.  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  DVT Prophylaxis/Anticoagulation: Subcutaneous heparin initiated 03/14/2018.  Monitor for any bleeding episodes 3. Pain Management: Tylenol as needed 4. Mood: Provide emotional support 5. Neuropsych: This patient is capable of making decisions on his own behalf. 6. Skin/Wound Care: Routine skin checks 7. Fluids/Electrolytes/Nutrition: Routine in and outs   BMP within acceptable range on 9/26 8.  PAF with RVR.  Cardizem CD 180 mg daily, Lopressor 50 mg twice daily.  Cardiac rate controlled.  Follow-up cardiology services 9.  Diabetes mellitus. Hemoglobin A1c 6.4.  Glucophage 850 mg twice daily.  Check blood sugars before meals and at bedtime.   CBG (last 3)  Recent Labs    03/21/18 1636 03/21/18 2136 03/22/18 0655  GLUCAP 108* 114* 99   Controlled 9/30 10.  Hyperlipidemia.  Patient on Lipitor prior to admission 11.  Hypertension   Vitals:   03/21/18 2032 03/22/18 0800  BP: 120/90 139/88  Pulse: 77 68  Resp: 19   Temp: 97.8 F (36.6 C)   SpO2: 100% 98%   Controlled on 9/30  LOS (Days) 5 A  FACE TO FACE EVALUATION WAS PERFORMED  Ankit [name] 03/22/2018 8:37 AM

## 2018-03-22 NOTE — Progress Notes (Signed)
Speech Language Pathology Daily Session Note  Patient Details  Name: Marcus Beasley MRN: [id number] Date of Birth: 06/11/1946  Today's Date: 03/22/2018 SLP Individual Time: 0355-9741 SLP Individual Time Calculation (min): 60 min  Short Term Goals: Week 1: SLP Short Term Goal 1 (Week 1): Pt will complete semi-complex cognitive tasks with supervision cues for functional problem solving.  SLP Short Term Goal 2 (Week 1): Pt will recall complex, new information with supervision cues for use of external aids.  SLP Short Term Goal 3 (Week 1): Pt will locate objects to the left of midline during functional tasks with supervision cues.    Skilled Therapeutic Interventions:Skilled ST services focused on cognitive skills. SLP facilitated semi-complex problem solving and left scanning during functional medication management and money tasks, pt required supervision A verbal cues. SLP facilitated recall of complex information, utilizing medication list required min-supervision A verbal cues. Pt required min A verbal cues to maintain eye contact with SLP sitting on left side during conversation. SLP assisted nurse tech. In transferring pt into bed, visitors present in room Pt was left in room with call bell within reach and bed alarm set. Recommend to continue skilled ST services.      Function:  Eating Eating                 Cognition Comprehension Comprehension assist level: Understands basic 90% of the time/cues < 10% of the time  Expression   Expression assist level: Expresses complex 90% of the time/cues < 10% of the time  Social Interaction Social Interaction assist level: Interacts appropriately with others with medication or extra time (anti-anxiety, antidepressant).  Problem Solving Problem solving assist level: Solves complex 90% of the time/cues < 10% of the time;Solves basic problems with no assist  Memory Memory assist level: Recognizes or recalls 90% of the time/requires cueing < 10%  of the time    Pain Pain Assessment Pain Score: 0-No pain  Therapy/Group: Individual Therapy  Don Giarrusso  [facility] 03/22/2018, 10:56 AM

## 2018-03-23 ENCOUNTER — Inpatient Hospital Stay (HOSPITAL_COMMUNITY): Payer: Medicare Other | Admitting: Speech Pathology

## 2018-03-23 ENCOUNTER — Inpatient Hospital Stay (HOSPITAL_COMMUNITY): Payer: Medicare Other | Admitting: Occupational Therapy

## 2018-03-23 ENCOUNTER — Inpatient Hospital Stay (HOSPITAL_COMMUNITY): Payer: Medicare Other | Admitting: Physical Therapy

## 2018-03-23 LAB — GLUCOSE, CAPILLARY
Glucose-Capillary: 101 mg/dL — ABNORMAL HIGH (ref 70–99)
Glucose-Capillary: 112 mg/dL — ABNORMAL HIGH (ref 70–99)
Glucose-Capillary: 116 mg/dL — ABNORMAL HIGH (ref 70–99)
Glucose-Capillary: 102 mg/dL — ABNORMAL HIGH (ref 70–99)

## 2018-03-23 NOTE — Progress Notes (Addendum)
Physical Therapy Session Note  Patient Details  Name: Marcus Beasley MRN: [id number] Date of Birth: 1946-04-26  Today's Date: 03/23/2018 PT Individual Time: 1421-1503 PT Individual Time Calculation (min): 42 min   Short Term Goals: Week 1:  PT Short Term Goal 1 (Week 1): Pt will perform bed <> w/c transfers with modAx1 PT Short Term Goal 2 (Week 1): Pt will ambulate 43ft with modAx1 PT Short Term Goal 3 (Week 1): Pt will statically stand for 68min with midline orientation PT Short Term Goal 4 (Week 1): Pt will initiate stair training  Skilled Therapeutic Interventions/Progress Updates:  Pt received in bed & agreeable to tx, denying c/o pain. Pt transferred supine>sitting EOB with hospital bed features and min assist for LLE and to fully scoot L hips to EOB. Therapist's dons tennis shoes total assist for time management. Pt completes squat pivot to L with mod assist with max multimodal cuing for anterior weight shifting, head/hips relationship, and sequencing. Pt transferred sit>stand at sink with max assist to allow therapist to adjust shorts total assist. Pt propels w/c room>dayroom with supervision, but requiring assistance on one occasion and max cuing throughout task for L attention and obstacle avoidance as pt is impulsive with task and unaware of L inattention deficits. Pt completes stand pivot to L with max assist w/c>nu-step. Pt utilizes nu-step on level 2 up to level 3 x 10 minutes with all four extremities & LUE support, with task focusing on coordination of reciprocal movements and L NMR; pt reports 5/10 RPE during task. Pt returned to w/c & bed via squat pivot with cuing for technique & mod assist. Pt requires min assist for sit>supine for LLE placement.  Pt left in bed with alarm set & all needs in reach.  Therapy Documentation Precautions:  Precautions Precautions: Fall Precaution Comments: L hemiparesis- L lateral lean sitting unsupported and in standing  Restrictions Weight  Bearing Restrictions: No    Therapy/Group: Individual Therapy  Waunita Schooner 03/23/2018, 3:49 PM

## 2018-03-23 NOTE — Progress Notes (Signed)
Occupational Therapy Session Note  Patient Details  Name: Marcus Beasley MRN: [id number] Date of Birth: [birth year]  Today's Date: 03/23/2018 OT Individual Time: 6546-5035 OT Individual Time Calculation (min): 75 min    Short Term Goals: Week 1:  OT Short Term Goal 1 (Week 1): Pt will maintain dynamic sitting balance on EOB/EOM with supervision in prep for functional tasks  OT Short Term Goal 2 (Week 1): Pt will complete UB dressing with min VC'ing OT Short Term Goal 3 (Week 1): Pt will perform toilet transfer with max A + 1 in order to decrease caregiver burden  OT Short Term Goal 4 (Week 1): Pt will thread BLEs into pants seated in w/c with CGA   Skilled Therapeutic Interventions/Progress Updates:    Pt sitting in w/c upon entry with no reports of pain however requesting to use bathroom. Squat pivot transfer w/c to toilet req mod A + 2 and min VC's for hand positioning on grab bar. Pt with varying postural control and decreased body awareness while seated on toilet requiring therapist to provide min VC's for maintaining midline and upright sitting posture. Pt with significant LOB to L requiring mod A from therapist to maintain position therefore STEDY used for transfer off toilet > shower. Pt then performing all aspects of bathing while seated requiring assistance from therapist for buttocks and thoroughness of RUE. Pt required less VC's this session for attention to LUE during bathing as well as initiated LUE use for bathing RUE however therapist required to complete functional movement pattern. Pt transferred to w/c, during w/c mobility without leg rests on pt with decreased body awareness of L LE position requiring min VC's from therapist for extending leg for safety with pt initially continuing to self propel with LLE under w/c. Pt performed dressing and grooming tasks while seated at sink with incorporation of LUE during tasks with facilitation at elbow/wrist by therapist to simulate  functional movement. Pt demonstrated good recall of hemi technique when prompted however still requires assistance for threading LUE. Pt left seated in w/c with all needs in reach and chair alarm set.    Therapy Documentation Precautions:  Precautions Precautions: Fall Precaution Comments: L hemiparesis- L lateral lean sitting unsupported and in standing  Restrictions Weight Bearing Restrictions: No  Therapy/Group: Individual Therapy  Rogerick Baldwin 03/23/2018, 1:47 PM

## 2018-03-23 NOTE — Progress Notes (Signed)
Speech Language Pathology Daily Session Note  Patient Details  Name: AC COLAN MRN: [id number] Date of Birth: 10-21-1945  Today's Date: 03/23/2018 SLP Individual Time: 1100-1130 SLP Individual Time Calculation (min): 30 min  Short Term Goals: Week 1: SLP Short Term Goal 1 (Week 1): Pt will complete semi-complex cognitive tasks with supervision cues for functional problem solving.  SLP Short Term Goal 2 (Week 1): Pt will recall complex, new information with supervision cues for use of external aids.  SLP Short Term Goal 3 (Week 1): Pt will locate objects to the left of midline during functional tasks with supervision cues.    Skilled Therapeutic Interventions:  Pt was seen for skilled ST targeting cognitive goals.  SLP facilitated the session with a novel deductive reasoning/scheduling task to address visual scanning and semi-complex problem solving.  Pt needed supervision cues for complete visual scanning of a Aslin Farinas of text and min assist verbal cues for task organization and error awareness to complete schedule for 100% accuracy.  Pt reports that he would prefer to spend more time with PT and OT instead of continuing with ST.  SLP discussed pt's progress with primary PT who feels that pt is a little impulsive with mobility but otherwise had good awareness of his deficits.  Will continue to follow up for ST but pt may be near his baseline and ready for discharge from [facility] soon.      Pain Pain Assessment Pain Scale: 0-10 Pain Score: 0-No pain  Therapy/Group: Individual Therapy  Jager Koska, Selinda Orion 03/23/2018, 12:26 PM

## 2018-03-23 NOTE — Progress Notes (Signed)
[facility]IC[facility] RE[facility]ABILITATION     PROGRESS NOTE  Subjective/Complaints:  Patient seen sitting up in his chair this morning.  He states he slept well overnight.  He states they are working hard in therapies.  ROS: Denies CP, SOB, N/V/D  Objective: Vital Signs: Blood pressure (!) 130/92, pulse 64, temperature 98 F (36.7 C), temperature source Oral, resp. rate 18, height 5\' 11"  (1.803 m), weight 101.3 kg, SpO2 98 %. No results found. No results for input(s): WBC, HGB, HCT, PLT in the last 72 hours. No results for input(s): NA, K, CL, GLUCOSE, BUN, CREATININE, CALCIUM in the last 72 hours.  Invalid input(s): CO CBG (last 3)  Recent Labs    03/22/18 1711 03/22/18 2103 03/23/18 0644  GLUCAP 110* 114* 101*    Wt Readings from Last 3 Encounters:  03/17/18 101.3 kg  03/12/18 84.7 kg  11/11/17 86.6 kg    Physical Exam:  BP (!) 130/92 (BP Location: Right Arm)   Pulse 64   Temp 98 F (36.7 C) (Oral)   Resp 18   Ht 5\' 11"  (1.803 m)   Wt 101.3 kg   SpO2 98%   BMI 31.15 kg/m  Constitutional: No distress . Vital signs reviewed. HENT: Normocephalic.  Atraumatic. Eyes: EOMI. No discharge. Cardiovascular: RRR.  No JVD. Respiratory: CTA bilaterally.  Normal effort. GI: BS +. Non-distended. Musc: No edema or tenderness in extremities. Neurological: He is alert and oriented Follows basic commands  Motor: RUE/RLE: 5/5 proximal to distal LUE: Shoulder abduction 2-/5, elbow flex/ext 2/5, hand grip 4/5, improving LLE: HF, KE, ADF 4/5, stable Left facial droop Skin: Skin is warm and dry.  Psychiatric: He has a normal mood and affect. His behavior is normal. Thought content normal.   Assessment/Plan: 1. Functional deficits secondary to right thalamic hemorrhage which require 3+ hours per day of interdisciplinary therapy in a comprehensive inpatient rehab setting. Physiatrist is providing close team supervision and 24 hour management of active medical problems listed  below. Physiatrist and rehab team continue to assess barriers to discharge/monitor patient progress toward functional and medical goals.  Function:  Bathing Bathing position   Position: Shower  Bathing parts Body parts bathed by patient: Left arm, Chest, Abdomen, Front perineal area, Buttocks, Right upper leg, Left upper leg, Right lower leg Body parts bathed by helper: Right lower leg, Left lower leg, Back  Bathing assist Assist Level: 2 helpers      Upper Body Dressing/Undressing Upper body dressing   What is the patient wearing?: Pull over shirt/dress     Pull over shirt/dress - Perfomed by patient: Thread/unthread right sleeve, Put head through opening, Pull shirt over trunk Pull over shirt/dress - Perfomed by helper: Thread/unthread left sleeve        Upper body assist Assist Level: Touching or steadying assistance(Pt > 75%)      Lower Body Dressing/Undressing Lower body dressing   What is the patient wearing?: Pants, Non-skid slipper socks   Underwear - Performed by helper: Thread/unthread right underwear leg, Pull underwear up/down, Thread/unthread left underwear leg Pants- Performed by patient: Thread/unthread right pants leg Pants- Performed by helper: Thread/unthread left pants leg, Pull pants up/down   Non-skid slipper socks- Performed by helper: Don/doff right sock, Don/doff left sock                  Lower body assist Assist for lower body dressing: 2 Helpers      Toileting Toileting   Toileting steps completed by patient:  Performs perineal hygiene Toileting steps completed by helper: Adjust clothing prior to toileting, Performs perineal hygiene, Adjust clothing after toileting Toileting Assistive Devices: Grab bar or rail  Toileting assist Assist level: Two helpers   Transfers Chair/bed transfer   Chair/bed transfer method: Squat pivot Chair/bed transfer assist level: 2 helpers Chair/bed transfer assistive device: Mechanical lift Mechanical  lift: Ecologist     Max distance: 5' Assist level: 2 helpers   Wheelchair   Type: Manual Max wheelchair distance: 150' Assist Level: Supervision or verbal cues  Cognition Comprehension Comprehension assist level: Understands basic 90% of the time/cues < 10% of the time  Expression Expression assist level: Expresses complex 90% of the time/cues < 10% of the time  Technical sales engineer Social Interaction assist level: Interacts appropriately with others with medication or extra time (anti-anxiety, antidepressant).  Problem Solving Problem solving assist level: Solves complex 90% of the time/cues < 10% of the time, Solves basic problems with no assist  Memory Memory assist level: Recognizes or recalls 90% of the time/requires cueing < 10% of the time    Medical Problem List and Plan: 1.  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  DVT Prophylaxis/Anticoagulation: Subcutaneous heparin initiated 03/14/2018.  Monitor for any bleeding episodes 3. Pain Management: Tylenol as needed 4. Mood: Provide emotional support 5. Neuropsych: This patient is capable of making decisions on his own behalf. 6. Skin/Wound Care: Routine skin checks 7. Fluids/Electrolytes/Nutrition: Routine in and outs   BMP within acceptable range on 9/26 8.  PAF with RVR.  Cardizem CD 180 mg daily, Lopressor 50 mg twice daily.  Cardiac rate controlled.  Follow-up cardiology services 9.  Diabetes mellitus. Hemoglobin A1c 6.4.  Glucophage 850 mg twice daily.  Check blood sugars before meals and at bedtime.   CBG (last 3)  Recent Labs    03/22/18 1711 03/22/18 2103 03/23/18 0644  GLUCAP 110* 114* 101*   Controlled 10/1 10.  Hyperlipidemia.  Patient on Lipitor prior to admission 11.  Hypertension   Vitals:   03/22/18 2035 03/23/18 0532  BP: 140/88 (!) 130/92  Pulse: 78 64  Resp: 18 18  Temp: 97.9 F (36.6 C) 98 F (36.7 C)  SpO2:  100% 98%   Continue Lopressor  Controlled on 10/1  LOS (Days) 6 A FACE TO FACE EVALUATION WAS PERFORMED  Vanna Shavers [name] 03/23/2018 8:28 AM

## 2018-03-23 NOTE — Plan of Care (Signed)
  Problem: RH BLADDER ELIMINATION Goal: RH STG MANAGE BLADDER WITH ASSISTANCE Description STG Manage Bladder With Assistance. Mod I  Outcome: Progressing Flowsheets (Taken 03/23/2018 1405) STG: Pt will manage bladder with assistance: 5-Supervision/set up   Problem: RH SAFETY Goal: RH STG ADHERE TO SAFETY PRECAUTIONS W/ASSISTANCE/DEVICE Description STG Adhere to Safety Precautions With Assistance/Device. Mod I  Outcome: Progressing Flowsheets (Taken 03/23/2018 1405) STG:Pt will adhere to safety precautions with assistance/device: 3-Moderate assistance   Problem: RH PAIN MANAGEMENT Goal: RH STG PAIN MANAGED AT OR BELOW PT'S PAIN GOAL Description Less than 3   Outcome: Progressing   Problem: RH KNOWLEDGE DEFICIT Goal: RH STG INCREASE KNOWLEDGE OF DIABETES Description Patient will be able to describe diet and medication to manage DM with cues.  Outcome: Progressing Goal: RH STG INCREASE KNOWLEDGE OF HYPERTENSION Description Patient will be able to describe management of hypertension with diet and medication. Patient will be able to describe target blood pressure range and how to check blood pressure. Cues.  Outcome: Progressing

## 2018-03-23 NOTE — Progress Notes (Signed)
Physical Therapy Session Note  Patient Details  Name: Marcus Beasley MRN: [id number] Date of Birth: Nov 07, 1945  Today's Date: 03/23/2018 PT Individual Time: 1000-1100 PT Individual Time Calculation (min): 60 min   Short Term Goals: Week 1:  PT Short Term Goal 1 (Week 1): Pt will perform bed <> w/c transfers with modAx1 PT Short Term Goal 2 (Week 1): Pt will ambulate 74ft with modAx1 PT Short Term Goal 3 (Week 1): Pt will statically stand for 46min with midline orientation PT Short Term Goal 4 (Week 1): Pt will initiate stair training  Skilled Therapeutic Interventions/Progress Updates: Pt received in w/c, denies pain and agreeable to treatment. Transported totalA total/from gym. Gait x2 trials at [employer] rail with mod/maxA, w/c follow for safety, use of mirror visual feedback for postural control. Requires modA for LLE placement, verbal/tactile cues for R weight shift to improve L swing phase and toe clearance, and tactile cues at L knee during stance with verbal cues for quad activation, assist to prevent recurvatum d/t decreased proprioception and motor control combined with pushing tendencies. Standing balance, weight shifting and midline orientation performed in litegait harness with multimodal cues and feedback to improve alignment. Demo's overflow with RLE when attempting to activate LLE in static stance. Returned to room totalA; remained seated in w/c, chair alarm lap belt intact and all needs in reach.       Therapy Documentation Precautions:  Precautions Precautions: Fall Precaution Comments: L hemiparesis- L lateral lean sitting unsupported and in standing  Restrictions Weight Bearing Restrictions: No   Therapy/Group: Individual Therapy  Corliss Skains 03/23/2018, 11:45 AM

## 2018-03-24 ENCOUNTER — Inpatient Hospital Stay (HOSPITAL_COMMUNITY): Payer: Medicare Other | Admitting: Occupational Therapy

## 2018-03-24 ENCOUNTER — Inpatient Hospital Stay (HOSPITAL_COMMUNITY): Payer: Medicare Other | Admitting: Speech Pathology

## 2018-03-24 ENCOUNTER — Inpatient Hospital Stay (HOSPITAL_COMMUNITY): Payer: Medicare Other | Admitting: Physical Therapy

## 2018-03-24 LAB — GLUCOSE, CAPILLARY
Glucose-Capillary: 143 mg/dL — ABNORMAL HIGH (ref 70–99)
Glucose-Capillary: 106 mg/dL — ABNORMAL HIGH (ref 70–99)
Glucose-Capillary: 108 mg/dL — ABNORMAL HIGH (ref 70–99)

## 2018-03-24 NOTE — Progress Notes (Signed)
[facility] PH[facility] MEDICINE & REHABILITATION     PROGRESS NOTE  Subjective/Complaints:  Patient seen laying in bed this morning.  He states he slept well overnight.  He has questions about home vitamin use.  ROS: Denies CP, SOB, N/V/D  Objective: Vital Signs: Blood pressure 126/90, pulse 67, temperature 97.9 F (36.6 C), temperature source Oral, resp. rate 18, height 5\' 11"  (1.803 m), weight 101.3 kg, SpO2 98 %. No results found. No results for input(s): WBC, HGB, HCT, PLT in the last 72 hours. No results for input(s): NA, K, CL, GLUCOSE, BUN, CREATININE, CALCIUM in the last 72 hours.  Invalid input(s): CO CBG (last 3)  Recent Labs    03/23/18 1657 03/23/18 2132 03/24/18 0740  GLUCAP 116* 102* 143*    Wt Readings from Last 3 Encounters:  03/17/18 101.3 kg  03/12/18 84.7 kg  11/11/17 86.6 kg    Physical Exam:  BP 126/90 (BP Location: Right Arm)   Pulse 67   Temp 97.9 F (36.6 C) (Oral)   Resp 18   Ht 5\' 11"  (1.803 m)   Wt 101.3 kg   SpO2 98%   BMI 31.15 kg/m  Constitutional: No distress . Vital signs reviewed. HENT: Normocephalic.  Atraumatic. Eyes: EOMI. No discharge. Cardiovascular: RRR.  No JVD. Respiratory: CTA bilaterally.  Normal effort. GI: BS +. Non-distended. Musc: No edema or tenderness in extremities. Neurological: He is alert and oriented Follows basic commands  Motor: RUE/RLE: 5/5 proximal to distal LUE: Shoulder abduction 2-/5, elbow flex/ext 2/5, hand grip 4/5, stable LLE: HF, KE, ADF 4/5, stable Left facial droop Skin: Skin is warm and dry.  Psychiatric: He has a normal mood and affect. His behavior is normal. Thought content normal.   Assessment/Plan: 1. Functional deficits secondary to right thalamic hemorrhage which require 3+ hours per day of interdisciplinary therapy in a comprehensive inpatient rehab setting. Physiatrist is providing close team supervision and 24 hour management of active medical problems listed below. Physiatrist  and rehab team continue to assess barriers to discharge/monitor patient progress toward functional and medical goals.  Function:  Bathing Bathing position   Position: Shower  Bathing parts Body parts bathed by patient: Left arm, Chest, Abdomen, Front perineal area, Buttocks, Right upper leg, Left upper leg, Right lower leg Body parts bathed by helper: Right lower leg, Left lower leg, Back  Bathing assist Assist Level: 2 helpers      Upper Body Dressing/Undressing Upper body dressing   What is the patient wearing?: Pull over shirt/dress     Pull over shirt/dress - Perfomed by patient: Thread/unthread right sleeve, Put head through opening, Pull shirt over trunk Pull over shirt/dress - Perfomed by helper: Thread/unthread left sleeve        Upper body assist Assist Level: Touching or steadying assistance(Pt > 75%)      Lower Body Dressing/Undressing Lower body dressing   What is the patient wearing?: Pants, Non-skid slipper socks   Underwear - Performed by helper: Thread/unthread right underwear leg, Pull underwear up/down, Thread/unthread left underwear leg Pants- Performed by patient: Thread/unthread right pants leg Pants- Performed by helper: Thread/unthread left pants leg, Pull pants up/down   Non-skid slipper socks- Performed by helper: Don/doff right sock, Don/doff left sock                  Lower body assist Assist for lower body dressing: 2 Helpers      Toileting Toileting   Toileting steps completed by patient: Performs perineal hygiene Toileting steps  completed by helper: Adjust clothing prior to toileting, Performs perineal hygiene, Adjust clothing after toileting Toileting Assistive Devices: Grab bar or rail  Toileting assist Assist level: Two helpers   Transfers Chair/bed transfer   Chair/bed transfer [place] transfer assist level: 2 helpers Chair/bed transfer assistive device: Mechanical lift Mechanical lift: Press photographer     Max distance: 5' Assist level: 2 helpers   Wheelchair   Type: Manual Max wheelchair distance: 150' Assist Level: Supervision or verbal cues  Cognition Comprehension Comprehension assist level: Understands basic 90% of the time/cues < 10% of the time  Expression Expression assist level: Expresses complex 90% of the time/cues < 10% of the time  Technical sales engineer Social Interaction assist level: Interacts appropriately with others with medication or extra time (anti-anxiety, antidepressant).  Problem Solving Problem solving assist level: Solves complex 90% of the time/cues < 10% of the time, Solves basic problems with no assist  Memory Memory assist level: Recognizes or recalls 90% of the time/requires cueing < 10% of the time    Medical Problem List and Plan: 1.  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  DVT Prophylaxis/Anticoagulation: Subcutaneous heparin initiated 03/14/2018.  Monitor for any bleeding episodes 3. Pain Management: Tylenol as needed 4. Mood: Provide emotional support 5. Neuropsych: This patient is capable of making decisions on his own behalf. 6. Skin/Wound Care: Routine skin checks 7. Fluids/Electrolytes/Nutrition: Routine in and outs   BMP within acceptable range on 9/26 8.  PAF with RVR.  Cardizem CD 180 mg daily, Lopressor 50 mg twice daily.  Cardiac rate controlled.  Follow-up cardiology services 9.  Diabetes mellitus. Hemoglobin A1c 6.4.  Glucophage 850 mg twice daily.  Check blood sugars before meals and at bedtime.   CBG (last 3)  Recent Labs    03/23/18 1657 03/23/18 2132 03/24/18 0740  GLUCAP 116* 102* 143*   Slightly labile on 10/2 10.  Hyperlipidemia.  Patient on Lipitor prior to admission 11.  Hypertension   Vitals:   03/23/18 2032 03/24/18 0617  BP: (!) 152/98 126/90  Pulse: 72 67  Resp: 18 18  Temp: 98.4 F (36.9 C) 97.9 F (36.6 C)  SpO2: 100%  98%   Continue Lopressor  Labile on 10/2  LOS (Days) 7 A FACE TO FACE EVALUATION WAS PERFORMED  Jonni Oelkers [name] 03/24/2018 8:17 AM

## 2018-03-24 NOTE — Progress Notes (Signed)
Physical Therapy Session Note  Patient Details  Name: Marcus Beasley MRN: [id number] Date of Birth: [birth year]  Today's Date: 03/24/2018 PT Individual Time: 0930-1045 PT Individual Time Calculation (min): 75 min   Short Term Goals: Week 1:  PT Short Term Goal 1 (Week 1): Pt will perform bed <> w/c transfers with modAx1 PT Short Term Goal 2 (Week 1): Pt will ambulate 54ft with modAx1 PT Short Term Goal 3 (Week 1): Pt will statically stand for 26min with midline orientation PT Short Term Goal 4 (Week 1): Pt will initiate stair training  Skilled Therapeutic Interventions/Progress Updates:    Pt received sitting in w/c and agreeable to PT treatment. Pt rolled to rehab gym with R hemi technique with S. Squat pivot transfer to/from w/c to mat table x2 with minA and verbal cues for sequencing and safety. Seated scooting at edge of mat from side to side for focus on head/hips relationship and coordination. Verbal cues for sequence and technique. Seated reaching to the R superior to promote anterior weightshift, initiate sit>stand, and decrease pushing/leaning to the L. Once in standing, pt demonstrated strong R knee extension, L knee flexion, and pushing to L, so reaching was changed to the R inferior to really achieve weightshifting to the R without pushing tendencies. Sidelying propped on elbow (hips/knees bent) trunk lengthening/shortening with shoulder reaching/scapular elevation/upward rotation/depression and cervical spine side flexion simultaneously to facilitate trunk control/coordination; performed on each side. MaxA ambulation with 2 helpers for ~21ft. Visual feedback with mirror for midline correction. Mod/max verbal and tactile cues for weightshifting to R for LLE swing/step, weight acceptance on L, prevention of L knee buckling/hyperextension, and upright posture. Crossing L LE over R LE to promote weightshifting to R and to facilitate R trunk lengthening and L trunk shortening. Pt returned to  room totalA. Remained in w/c with chair alarm intact and all needs in reach.  Therapy Documentation Precautions:  Precautions Precautions: Fall Precaution Comments: L hemiparesis- L lateral lean sitting unsupported and in standing  Restrictions Weight Bearing Restrictions: No    Therapy/Group: Individual Therapy  [place] Makinzi Prieur, SPT 03/24/2018, 12:08 PM

## 2018-03-24 NOTE — Patient Care Conference (Signed)
Inpatient RehabilitationTeam Conference and Plan of Care Update Date: 03/24/2018   Time: 2:10 PM    Patient Name: Gallia Record Number: [id number]  Date of Birth: 05-24-1946 Sex: Male         Room/Bed: 4M07C/4M07C-01 Payor Info: Payor: Marine scientist / Plan: [employer] MEDICARE / Product Type: *No Product type* /    Admitting Diagnosis: R ICH  Admit Date/Time:  03/17/2018  3:24 PM Admission Comments: No comment available   Primary Diagnosis:  <principal problem not specified> Principal Problem: <principal problem not specified>  Patient Active Problem List   Diagnosis Date Noted  . PAF (paroxysmal atrial fibrillation) ([place])   . Diabetes mellitus type 2 in obese ([place])   . Hemiparesis affecting left side as late effect of stroke ([place])   . Thalamic hemorrhage ([place]) 03/17/2018  . Diabetes mellitus type 2 in nonobese ([facility])   . Benign essential HTN   . Dyslipidemia   . Paroxysmal A-fib ([place])   . Hemorrhagic stroke ([place])   . ICH (intracerebral hemorrhage) ([place]) 03/12/2018  . Prostate cancer ([place]) 01/29/2017  . 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 Diabetes ([place]) 12/14/2015  . PCP NOTES >>>>>>>>>>>>>>>>>>>>>>>>>>>>>>. 08/06/2015  . Dizziness and giddiness 01/29/2015  . Umbilical hernia 63/06/6008  . Elevated PSA, less than 10 ng/ml 04/19/2012  . Annual physical exam 01/24/2011  . Hyperlipidemia 01/03/2010  . Essential hypertension 09/20/2007    Expected Discharge Date: Expected Discharge Date: 04/15/18  Team Members Present: Physician leading conference: Dr. Delice Lesch Social Worker Present: Ovidio Kin, LCSW Nurse Present: Rayetta Pigg, RN PT Present: Kem Parkinson, PT OT Present: Napoleon Form, OT SLP Present: Windell Moulding, SLP PPS Coordinator present : Daiva Nakayama, RN, CRRN     Current Status/Progress Goal Weekly Team Focus  Medical   Left-sided weakness secondary to right thalamic hemorrhage secondary to hypertensive  crisis  Improve mobility, DM/BP  See above   Bowel/Bladder   cont b/b; needs assistance with urinal at timesl lbm 10/1  cont with min assist  assess q shift and prn; hourly rounding   Swallow/Nutrition/ Hydration             ADL's   mod A + 2/STEDY for functional transfers, difficulty obtaining midline; max A LB dressing/toileting; mod A UB dressing   supervision for all self care goals except grooming   ADL retraining, hemi technique, functional transfers, midline orientation    Mobility   minA bed mobility, modA transfers, maxA gait   minA overall   midline orientation, dynamic sitting/standing balance   Communication             Safety/Cognition/ Behavioral Observations  Min assist-supervision   mod I   higher  level awareness, memory, and problem solving; may be ready for discharge from [facility] soon   Pain   denies  <2  assess q shift and prn   Skin   CDI  free from infection/breakdown  assess q shift and prn      *See Care Plan and progress notes for long and short-term goals.     Barriers to Discharge  Current Status/Progress Possible Resolutions Date Resolved   Physician    Medical stability     See above  Therapies, optimize DM/BP meds      Nursing                  PT  OT                  SLP                SW                Discharge Planning/Teaching Needs:  Home with wife who can assist and they have two daughter's who will assist when not working. Pt very motivated to do well here.      Team Discussion:  Goals supervision-min assist level. Pushes to the left. MD adjusting BP and DM meds. Needs cues to pay attention to his left side. Speech to reach goals before DC and will probably DC next week. Need family training prior to DC home. Body awareness poor if can figure this out will make progress quicker.  Revisions to Treatment Plan:  DC 10/24    Continued Need for Acute Rehabilitation Level of Care: The patient requires daily medical  management by a physician with specialized training in physical medicine and rehabilitation for the following conditions: Daily direction of a multidisciplinary physical rehabilitation program to ensure safe treatment while eliciting the highest outcome that is of practical value to the patient.: Yes Daily medical management of patient stability for increased activity during participation in an intensive rehabilitation regime.: Yes Daily analysis of laboratory values and/or radiology reports with any subsequent need for medication adjustment of medical intervention for : Neurological problems;Blood pressure problems;Diabetes problems   I attest that I was present, lead the team conference, and concur with the assessment and plan of the team.   Elease Hashimoto 03/24/2018, 2:44 PM

## 2018-03-24 NOTE — Progress Notes (Signed)
Occupational Therapy Session Note  Patient Details  Name: Marcus Beasley MRN: [id number] Date of Birth: 10/30/1945  Today's Date: 03/24/2018 OT Individual Time: 0830-0910 OT Individual Time Calculation (min): 40 min    Short Term Goals: Week 1:  OT Short Term Goal 1 (Week 1): Pt will maintain dynamic sitting balance on EOB/EOM with supervision in prep for functional tasks  OT Short Term Goal 2 (Week 1): Pt will complete UB dressing with min VC'ing OT Short Term Goal 3 (Week 1): Pt will perform toilet transfer with max A + 1 in order to decrease caregiver burden  OT Short Term Goal 4 (Week 1): Pt will thread BLEs into pants seated in w/c with CGA   Skilled Therapeutic Interventions/Progress Updates:    1:1 Focus on bed mobilty to come to EOB. Pt able to perform stand pivot with mod A into the w/c and then to the toilet using the grab bar. PT able to maintain balance on the commode. Sit to stand with min A with min to mod A to maintain standing balance while therapy assist with toilet hygiene. Pt then able to turn 90 degrees (around) to face the toilet with mod to max A with grab bar to attempt to urinate. Pt requires max multimodal cues for sequencing body and for weight shifts to maintain balance. Transitioned to the sink and perform hand washing at the sink with mod A to maintain upright position at midline with emphasis on bilateral tasks to incorporate left hand. Left sitting upright in w/c.  Therapy Documentation Precautions:  Precautions Precautions: Fall Precaution Comments: L hemiparesis- L lateral lean sitting unsupported and in standing  Restrictions Weight Bearing Restrictions: No Pain: No c/o pain in session   Therapy/Group: Individual Therapy  Willeen Cass [facility] 03/24/2018, 3:27 PM

## 2018-03-24 NOTE — Progress Notes (Signed)
Speech Language Pathology Discharge Summary  Patient Details  Name: Marcus Beasley MRN: [id number] Date of Birth: February 07, 1946  Today's Date: 03/24/2018 SLP Individual Time: 1500-1530 SLP Individual Time Calculation (min): 30 min   Skilled Therapeutic Interventions:  Pt was seen for skilled ST targeting cognitive goals.  SLP had conversations with pt's primary PT and OT after yesterday's therapy session, both of whom are in agreement that pt's impulsivity appears to be related to baseline personality versus acute cognitive dysfunction.  SLP discussed with pt that if he did well during today's therapy session and fully participated that therapist would discharge him from [facility].  Pt was in agreement and was mod I for planning and executing a problem solving strategy during a semi-complex, novel card game.  SLP provided skilled education regarding compensatory strategies for memory and organization in the home environment and informed pt that outpatient ST services are available through a referral from his PCP should he experience increased cognitive difficulty once discharged to the home environment.  All questions were answered to his satisfaction at this time.  As a result, no further ST needs are indicated at this time.       Patient has met 4 of 4 long term goals.  Patient to discharge at overall Modified Independent level.  Reasons goals not met:     Clinical Impression/Discharge Summary:   Pt has made functional gains while inpatient and is discharging from [facility] having met 4 out of 4 short term goals.  Pt is currently supervision-mod I for tasks and is back to baseline for cognition.   Pt/family education is complete at this time.  Pt has demonstrated improved awareness of his deficits.  As a result, no further ST needs indicated as pt is at baseline for cognition and communication.    Care Partner:  Caregiver Able to Provide Assistance: Yes  Type of Caregiver Assistance:  Physical;Cognitive  Recommendation:  24 hour supervision/assistance      Equipment: none recommended by SLP   Reasons for discharge: Discharged from hospital   Patient/Family Agrees with Progress Made and Goals Achieved: Yes    Dodie Parisi, Selinda Orion 03/24/2018, 3:53 PM

## 2018-03-24 NOTE — Progress Notes (Signed)
Social Work Patient ID: Marcus Beasley, male   DOB: 1945/12/13, 72 y.o.   MRN: [id number]  Met with pt and spoke whit his wife via telephone to discuss team conference goals superivision-min assist level and target discharge 10/24. Pt is trying hard and wants to be independent at discharge. Wife reports she will assist and is willing to come in nay time we need her too. Will continue to work on discharge needs.

## 2018-03-24 NOTE — Progress Notes (Signed)
Occupational Therapy Session Note  Patient Details  Name: Marcus Beasley MRN: [id number] Date of Birth: 1946-01-30  Today's Date: 03/24/2018 OT Individual Time: 1100-1200 OT Individual Time Calculation (min): 60 min   Skilled Therapeutic Interventions/Progress Updates:    Pt seen for OT session focuisng on functional transfers and neuro re-ed with L UE. Pt sitting up in w/c upon arrival, agreeable to tx session and denying pain. Denied need for bathing/dressing routine this morning.  Completed simulated shower stall transfer in pt's room. Completed stand pivot with heavy reliance on grab bars, +2 for safety with mod A overall for positioning of hips and max cuing for L LE activation and attention. Max A when exiting shower to pt's hemi side.  He self propelled w/c throughout unit using hemi-technique, VCs for awareness to environmental obstacles on L.  In supine on therapy mat, addressed shoulder activation and ROM n gravity eliminated position. Pt with difficulty breaking synergistc movement patterns.  Arm skate for shoulder AB/ADduction with assist to obtain full range of ABduction and controlled movements to midline.  Side-lying on L, reaching task with R UE to facilitate weightbearing and weight shift through L shoulder.  Pt with flexor synergy movements with all attempts of movement of L UE, manual cuing and assist to facilitate normal movement patterns. x2 sets of 5 glute bridges with min A to maintain neutral position of LEs. Pt returned to room at end of session, left seated in w/c with all needs in reach and chair alarm on.     Therapy Documentation Precautions:  Precautions Precautions: Fall Precaution Comments: L hemiparesis- L lateral lean sitting unsupported and in standing  Restrictions Weight Bearing Restrictions: No Pain:   No/denies pain   Therapy/Group: Individual Therapy  Tayt Moyers L 03/24/2018, 12:09 PM

## 2018-03-25 ENCOUNTER — Inpatient Hospital Stay (HOSPITAL_COMMUNITY): Payer: Medicare Other | Admitting: Occupational Therapy

## 2018-03-25 ENCOUNTER — Inpatient Hospital Stay (HOSPITAL_COMMUNITY): Payer: Medicare Other | Admitting: Physical Therapy

## 2018-03-25 LAB — GLUCOSE, CAPILLARY
Glucose-Capillary: 115 mg/dL — ABNORMAL HIGH (ref 70–99)
Glucose-Capillary: 110 mg/dL — ABNORMAL HIGH (ref 70–99)
Glucose-Capillary: 114 mg/dL — ABNORMAL HIGH (ref 70–99)
Glucose-Capillary: 101 mg/dL — ABNORMAL HIGH (ref 70–99)

## 2018-03-25 MED ORDER — SORBITOL 70 % SOLN
30.0000 mL | Freq: Every day | Status: DC | PRN
  Administered 2018-03-25 – 2018-04-10 (×4): 30 mL via ORAL
  Filled 2018-03-25 (×4): qty 30

## 2018-03-25 NOTE — Progress Notes (Signed)
Physical Therapy Session Note  Patient Details  Name: Marcus Beasley MRN: [id number] Date of Birth: 1946-06-19  Today's Date: 03/25/2018 PT Individual Time: 0830-0900 PT Individual Time Calculation (min): 30 min   Short Term Goals: Week 1:  PT Short Term Goal 1 (Week 1): Pt will perform bed <> w/c transfers with modAx1 PT Short Term Goal 2 (Week 1): Pt will ambulate 11ft with modAx1 PT Short Term Goal 3 (Week 1): Pt will statically stand for 73min with midline orientation PT Short Term Goal 4 (Week 1): Pt will initiate stair training  Skilled Therapeutic Interventions/Progress Updates:    Pt received seated in bed, agreeable to PT. No complaints of pain. Bed mobility min A. SPT bed to w/c with max A to the L. Pt requests to use the bathroom. Stedy transfer w/c to toilet. Pt is able to complete pericare with setup assist and max A for standing balance due to pushing to the left in stedy, dependent for brief management. Pt is dependent to don pants and socks/shoes. Pt left seated in w/c in room with needs in reach, in care of NA to take over upper body bathing and dressing due to time constraints.Throughout therapy session pt exhibits poor awareness of L hemibody and needs cueing to attend to L UE/LE with transfers and dressing tasks.  Therapy Documentation Precautions:  Precautions Precautions: Fall Precaution Comments: L hemiparesis- L lateral lean sitting unsupported and in standing  Restrictions Weight Bearing Restrictions: No   Therapy/Group: Individual Therapy  Excell Seltzer, PT, DPT  03/25/2018, 11:56 AM

## 2018-03-25 NOTE — Progress Notes (Addendum)
Occupational Therapy Weekly Progress Note  Patient Details  Name: Marcus Beasley MRN: [id number] Date of Birth: 30-Nov-1945  Beginning of progress report period: March 18, 2018 End of progress report period: March 25, 2018  Today's Date: 03/25/2018 OT Individual Time: 1300-1400 OT Individual Time Calculation (min): 60 min    Patient has met 2 of 4 short term goals.  Pt requires increased assist for UB and LB dressing 2/2 poor postural control needed for sitting balance during dressing task, as well as difficulty with hemi dressing technique 2/2 hemiparetic L UE/RE.   Patient continues to demonstrate the following deficits: abnormal posture, hemiplegia affecting non-dominant side and muscle weakness (generalized)  and therefore will continue to benefit from skilled OT intervention to enhance overall performance with BADL and Reduce care partner burden.   Pt is making steady progress towards OT goals. He is completing stand/squat pivot transfers with overall mod A. He cont to be most limited by poor postural control and body awareness, greatly impacting his functional standing and sitting balance during ADL tasks. Pt demonstrates L UE in gravity eliminated pains with functional gross grasping abilities. However, when attention diverted from L UE he is unable to maintain grasp on object. Pt with flexor synergistic movements in all major L UE muscle groups.  Pt with absent light touch and proprioception in  L UE.  Patient progressing toward long term goals..  Continue plan of care.  OT Short Term Goals Week 1:  OT Short Term Goal 1 (Week 1): Pt will maintain dynamic sitting balance on EOB/EOM with supervision in prep for functional tasks  OT Short Term Goal 1 - Progress (Week 1): Met OT Short Term Goal 2 (Week 1): Pt will complete UB dressing with min VC'ing OT Short Term Goal 2 - Progress (Week 1): Not met OT Short Term Goal 3 (Week 1): Pt will perform toilet transfer with max A + 1 in  order to decrease caregiver burden  OT Short Term Goal 3 - Progress (Week 1): Met OT Short Term Goal 4 (Week 1): Pt will thread BLEs into pants seated in w/c with CGA  OT Short Term Goal 4 - Progress (Week 1): Not met Week 2:  OT Short Term Goal 1 (Week 2): Pt will don pants with min A using AE PRN OT Short Term Goal 2 (Week 2): Pt will complete 1/3 toileting tasks with no more than mod steadying assist in order to decrease caregiver burden OT Short Term Goal 3 (Week 2): Pt will consistently complete functional transfers with no more than mod A OT Short Term Goal 4 (Week 2): Pt will don shirt with min cuing only   OT Skilled Intervention: Pt seen for OT session focusing on functional sitting balance during ADL task and neuro re-ed with L UE/LE. Pt in supine upon arrival, meal trays having just arrived on unit and pt desiring to eat and denying pain. He transferred to EOB with min A and cuing for sequencing/technique from flat bed in simulation of home environment. He ate lunch seated EOB with L UE supported in extension position at all major joints 2/2 increased flexor tone in L UE- pt tolerated with no complaints of pain. He self propelled w/c to therapy gym with supervision using hemi-technique. Squat pivot transfers completed with overall min A following mod cuing for hand placement, technique, and safety awareness.  Attempted quadraped and tall kneeling positions on mat- Pt tolerated well, however, difficulty obtaining and maintaining position 2/2 pusher like  tendencies and increased flexor tone in L UE with position. Seated EOM, completed forward flexion with UEs supported on large physio-ball, assist to obtain and maintain position with L UE.  Returned to room at end of session, left with hand off to NT for toileting task.   Therapy Documentation Precautions:  Precautions Precautions: Fall Precaution Comments: L hemiparesis- L lateral lean sitting unsupported and in standing   Restrictions Weight Bearing Restrictions: No   Onia Shiflett L 03/25/2018, 7:45 AM

## 2018-03-25 NOTE — Progress Notes (Signed)
Physical Therapy Weekly Progress Note  Patient Details  Name: Marcus Beasley MRN: [id number] Date of Birth: [birth year]  Beginning of progress report period: March 18, 2018 End of progress report period: March 25, 2018  Today's Date: 03/25/2018 PT Individual Time: 0830-0900 PT Individual Time Calculation (min): 30 min   Patient has met 1 of 4 short term goals. Pt is able to perform bed mobility with S and bed <> w/c transfers with modAx1. Currently, he is able to ambulate 43f with maxAx1, which has improved from 179fwith totalAx2 at eval. Pt is unable to statically stand and has yet to initiate stair training due to lack of midline orientation, but is improving.   Patient continues to demonstrate the following deficits muscle weakness, decreased cardiorespiratoy endurance, impaired timing and sequencing, unbalanced muscle activation, motor apraxia, decreased coordination and decreased motor planning, decreased midline orientation, decreased initiation, decreased awareness and decreased safety awareness, and decreased standing balance, decreased postural control, hemiplegia and decreased balance strategies and therefore will continue to benefit from skilled PT intervention to increase functional independence with mobility.  Patient progressing toward long term goals..  Continue plan of care.  PT Short Term Goals Week 1:  PT Short Term Goal 1 (Week 1): Pt will perform bed <> w/c transfers with modAx1 PT Short Term Goal 2 (Week 1): Pt will ambulate 2094fith modAx1 PT Short Term Goal 3 (Week 1): Pt will statically stand for 1mi27mith midline orientation PT Short Term Goal 4 (Week 1): Pt will initiate stair training  Skilled Therapeutic Interventions/Progress Updates:    Pt received seated in w/c and ready for PT treatment. Transfer to/from bed and w/c x2 with minA and min VC's for sequence and safety. TotalA w/c transport to/from rehab gym for energy/time conservation. Ambulation x4  trials with maxAx1 and 1 hall rail on R. Visual feedback with mirror for midline correction. Mod/max verbal and tactile cues for weightshifting to R for LLE swing/step/placement, weight acceptance on L, prevention of L knee buckling/hyperextension, and upright posture. MaxA standing weightshifts and maxA standing toe taps w/  RLE to promote weightbearing on L and using it to shift weight to midline/R w/ quad activation to prevent buckling/hyperextension; mirror for visual feedback and max verbal and tactile cues for sequence, safety, initiation, posture, and midline orientation. Pt returned to room totalA. MinA transfer to bed and remained in bed with bed alarm intact and all needs in reach.  Therapy Documentation Precautions:  Precautions Precautions: Fall Precaution Comments: L hemiparesis; strong L lateral lean in standing; pushing tendancies Restrictions Weight Bearing Restrictions: No General:   Vital Signs: Therapy Vitals Temp: 98 F (36.7 C) Pulse Rate: 64 Resp: 16 BP: 125/89 Patient Position (if appropriate): Lying Oxygen Therapy SpO2: 98 % O2 Device: Room Air Pain:   Vision/Perception  Perception Perception: Impaired Inattention/Neglect: Does not attend to left side of body Praxis Praxis: Intact  Mobility: Bed Mobility Bed Mobility: Rolling Right;Sitting - Scoot to Edge of Bed;Right Sidelying to Sit Rolling Right: Other (comment);Supervision/verbal cueing(1 bedrail) Right Sidelying to Sit: Other (comment);Supervision/Verbal cueing(1 bedrail) Sitting - Scoot to Edge of Bed: Supervision/Verbal cueing(1 bedrail) Transfers Transfers: Sit to Stand;Stand to Sit;Squat Pivot Transfers Sit to Stand: Minimal Assistance - Patient > 75% Stand to Sit: Minimal Assistance - Patient > 75% Squat Pivot Transfers: Minimal Assistance - Patient > 75% Transfer (Assistive device): Other (Comment)(1 helper) Locomotion : Gait Ambulation: Yes Gait Assistance: Total Assistance - Patient <  25% Gait Distance (Feet): 20 Feet Assistive device:  Other (Comment)(1 hall rail) Gait Assistance Details: Manual facilitation for weight bearing;Manual facilitation for placement;Visual cues/gestures for precautions/safety;Visual cues/gestures for sequencing;Verbal cues for sequencing;Verbal cues for technique;Verbal cues for precautions/safety;Verbal cues for gait pattern;Verbal cues for safe use of DME/AE;Tactile cues for weight shifting;Tactile cues for sequencing;Tactile cues for placement;Tactile cues for weight beaing Gait Assistance Details: 1 musketeer and 1 hall rail Gait Gait: Yes Gait Pattern: Impaired Gait Pattern: Decreased stride length;Decreased hip/knee flexion - left;Decreased dorsiflexion - left;Decreased weight shift to right;Decreased stance time - right;Decreased stance time - left;Left genu recurvatum;Lateral trunk lean to left;Decreased trunk rotation;Poor foot clearance - left;Poor foot clearance - right;Left flexed knee in stance Stairs / Additional Locomotion Stairs: No Architect: Yes Wheelchair Assistance: Chartered loss adjuster: Right lower extremity;Right upper extremity Wheelchair Parts Management: Needs assistance  Trunk/Postural Assessment : Cervical Assessment Cervical Assessment: Exceptions to WFL(forward head and flexed) Thoracic Assessment Thoracic Assessment: Exceptions to WFL(rounded shoulders; kyphotic) Lumbar Assessment Lumbar Assessment: Exceptions to WFL(reduced lordosis; PPT) Postural Control Postural Control: Deficits on evaluation Trunk Control: L lateral lean Postural Limitations: kyphotic with forward and flexed head  Balance: Balance Balance Assessed: Yes Static Sitting Balance Static Sitting - Balance Support: Right upper extremity supported;Feet supported Static Sitting - Level of Assistance: 5: Stand by assistance Dynamic Sitting Balance Dynamic Sitting - Balance Support: Feet  supported Dynamic Sitting - Level of Assistance: 4: Min assist Dynamic Sitting - Balance Activities: Lateral lean/weight shifting;Forward lean/weight shifting Static Standing Balance Static Standing - Balance Support: Bilateral upper extremity supported Static Standing - Level of Assistance: 1: +1 Total assist(R hall rail) Dynamic Standing Balance Dynamic Standing - Balance Support: Bilateral upper extremity supported;During functional activity Dynamic Standing - Level of Assistance: 1: +1 Total assist(R hall rail) Dynamic Standing - Balance Activities: Forward lean/weight shifting;Lateral lean/weight shifting   Therapy/Group: Individual Therapy  [place] Tedford Berg, SPT 03/25/2018, 7:43 AM

## 2018-03-25 NOTE — Progress Notes (Signed)
Occupational Therapy Session Note  Patient Details  Name: Marcus Beasley MRN: [id number] Date of Birth: 10/28/1945  Today's Date: 03/25/2018 OT Individual Time: 1415-1500 OT Individual Time Calculation (min): 45 min    Short Term Goals: Week 1:  OT Short Term Goal 1 (Week 1): (P) Pt will maintain dynamic sitting balance on EOB/EOM with supervision in prep for functional tasks  OT Short Term Goal 2 (Week 1): (P) Pt will complete UB dressing with min VC'ing OT Short Term Goal 2 - Progress (Week 1): (P) Not met OT Short Term Goal 3 (Week 1): (P) Pt will perform toilet transfer with max A + 1 in order to decrease caregiver burden  OT Short Term Goal 3 - Progress (Week 1): (P) Met OT Short Term Goal 4 (Week 1): Pt will thread BLEs into pants seated in w/c with CGA   Skilled Therapeutic Interventions/Progress Updates:    Pt up in bathroom at start of session completing toileting task with assist from NT. Pt agreeable to OT tx session, requesting to participate in tx session room due to fatigue, no c/o pain. Transitioned to EOB via Stedy. Seated EOB pt participated in LUE NMR activity including towel glides and AAROM for multiple repetitions, pushing forward/back, diagonally, clockwise/counter clockwise, and horizontally. Initiated education regarding self-ROM for LUE. Pt performing elbow flexion, shoulder flexion/horizontal abduction, wrist flexion, forearm supination/pronation. Pt requiring increased time and demonstrational/tactile cues for proper technique as pt with increased difficulty performing shoulder self-ROM (shoulder flexion more so than horizontal abduction). Will benefit from continued practice/review. Pt returned to supine for additional AAROM in gravity eliminated plane. Pt left supine in bed end of session with bed alarm set, call bell and needs within reach.   Therapy Documentation Precautions:  Precautions Precautions: Fall Precaution Comments: L hemiparesis; strong L lateral  lean in standing; pushing tendancies Restrictions Weight Bearing Restrictions: No    Other Treatments:     Therapy/Group: Individual Therapy  Raymondo Band 03/25/2018, 4:07 PM

## 2018-03-25 NOTE — Progress Notes (Signed)
[facility]IC[facility] & REHABILITATION     PROGRESS NOTE  Subjective/Complaints:  Patient seen sitting up in bed this AM.  He states he slept well overnight. He states he is working on walking with therapies.   ROS: Denies CP, SOB, N/V/D  Objective: Vital Signs: Blood pressure 125/89, pulse 64, temperature 98 F (36.7 C), resp. rate 16, height 5\' 11"  (1.803 m), weight 101.3 kg, SpO2 98 %. No results found. No results for input(s): WBC, HGB, HCT, PLT in the last 72 hours. No results for input(s): NA, K, CL, GLUCOSE, BUN, CREATININE, CALCIUM in the last 72 hours.  Invalid input(s): CO CBG (last 3)  Recent Labs    03/24/18 1203 03/24/18 2201 03/25/18 0658  GLUCAP 106* 108* 115*    Wt Readings from Last 3 Encounters:  03/17/18 101.3 kg  03/12/18 84.7 kg  11/11/17 86.6 kg    Physical Exam:  BP 125/89 (BP Location: Right Arm)   Pulse 64   Temp 98 F (36.7 C)   Resp 16   Ht 5\' 11"  (1.803 m)   Wt 101.3 kg   SpO2 98%   BMI 31.15 kg/m  Constitutional: No distress . Vital signs reviewed. HENT: Normocephalic.  Atraumatic. Eyes: EOMI. No discharge. Cardiovascular: RRR. No JVD. Respiratory: CTA bilaterally. Normal effort. GI: BS +. Non-distended. Musc: No edema or tenderness in extremities. Neurological: He is alert and oriented Follows basic commands  Motor: RUE/RLE: 5/5 proximal to distal LUE: Shoulder abduction 2-/5, elbow flex/ext 2/5, hand grip 4/5, unchanged LLE: HF, KE, ADF 4/5, unchanged Left facial droop Skin: Skin is warm and dry.  Psychiatric: He has a normal mood and affect. His behavior is normal. Thought content normal.   Assessment/Plan: 1. Functional deficits secondary to right thalamic hemorrhage which require 3+ hours per day of interdisciplinary therapy in a comprehensive inpatient rehab setting. Physiatrist is providing close team supervision and 24 hour management of active medical problems listed below. Physiatrist and rehab team continue  to assess barriers to discharge/monitor patient progress toward functional and medical goals.  Function:  Bathing Bathing position   Position: Shower  Bathing parts Body parts bathed by patient: Left arm, Chest, Abdomen, Front perineal area, Buttocks, Right upper leg, Left upper leg, Right lower leg Body parts bathed by helper: Right lower leg, Left lower leg, Back  Bathing assist Assist Level: 2 helpers      Upper Body Dressing/Undressing Upper body dressing   What is the patient wearing?: Pull over shirt/dress     Pull over shirt/dress - Perfomed by patient: Thread/unthread right sleeve, Put head through opening, Pull shirt over trunk Pull over shirt/dress - Perfomed by helper: Thread/unthread left sleeve        Upper body assist Assist Level: Touching or steadying assistance(Pt > 75%)      Lower Body Dressing/Undressing Lower body dressing   What is the patient wearing?: Pants, Non-skid slipper socks   Underwear - Performed by helper: Thread/unthread right underwear leg, Pull underwear up/down, Thread/unthread left underwear leg Pants- Performed by patient: Thread/unthread right pants leg Pants- Performed by helper: Thread/unthread left pants leg, Pull pants up/down   Non-skid slipper socks- Performed by helper: Don/doff right sock, Don/doff left sock                  Lower body assist Assist for lower body dressing: 2 Helpers      Toileting Toileting   Toileting steps completed by patient: Performs perineal hygiene Toileting steps completed by helper:  Adjust clothing prior to toileting, Performs perineal hygiene, Adjust clothing after toileting Toileting Assistive Devices: Grab bar or rail, Other (comment)(STEDY)  Toileting assist Assist level: Two helpers   Transfers Chair/bed transfer   Chair/bed transfer method: Squat pivot Chair/bed transfer assist level: 2 helpers Chair/bed transfer assistive device: Mechanical lift Mechanical lift: Press photographer     Max distance: 5' Assist level: 2 helpers   Wheelchair   Type: Manual Max wheelchair distance: 150' Assist Level: Supervision or verbal cues  Cognition Comprehension Comprehension assist level: Understands basic 90% of the time/cues < 10% of the time  Expression Expression assist level: Expresses complex 90% of the time/cues < 10% of the time  Social Interaction Social Interaction assist level: Interacts appropriately with others with medication or extra time (anti-anxiety, antidepressant).  Problem Solving Problem solving assist level: Solves complex 90% of the time/cues < 10% of the time, Solves basic problems with no assist  Memory Memory assist level: Recognizes or recalls 90% of the time/requires cueing < 10% of the time    Medical Problem List and Plan: 1.  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  DVT Prophylaxis/Anticoagulation: Subcutaneous heparin initiated 03/14/2018.  Monitor for any bleeding episodes 3. Pain Management: Tylenol as needed 4. Mood: Provide emotional support 5. Neuropsych: This patient is capable of making decisions on his own behalf. 6. Skin/Wound Care: Routine skin checks 7. Fluids/Electrolytes/Nutrition: Routine in and outs   BMP within acceptable range on 9/26  Labs ordered for tomorrow 8.  PAF with RVR.  Cardizem CD 180 mg daily, Lopressor 50 mg twice daily.  Cardiac rate controlled.  Follow-up cardiology services 9.  Diabetes mellitus. Hemoglobin A1c 6.4.  Glucophage 850 mg twice daily.  Check blood sugars before meals and at bedtime.   CBG (last 3)  Recent Labs    03/24/18 1203 03/24/18 2201 03/25/18 0658  GLUCAP 106* 108* 115*   Relatively controlled on 10/3 10.  Hyperlipidemia.  Patient on Lipitor prior to admission 11.  Hypertension   Vitals:   03/24/18 1942 03/25/18 0505  BP: 128/86 125/89  Pulse: 72 64  Resp: 17 16  Temp: 97.7 F (36.5 C)  98 F (36.7 C)  SpO2: 98% 98%   Continue Lopressor  Controlled on 10/3  LOS (Days) 8 A FACE TO FACE EVALUATION WAS PERFORMED  Ankit [name] 03/25/2018 9:51 AM

## 2018-03-26 ENCOUNTER — Inpatient Hospital Stay (HOSPITAL_COMMUNITY): Payer: Medicare Other | Admitting: Occupational Therapy

## 2018-03-26 ENCOUNTER — Inpatient Hospital Stay (HOSPITAL_COMMUNITY): Payer: Medicare Other | Admitting: Physical Therapy

## 2018-03-26 LAB — CBC WITH DIFFERENTIAL/PLATELET
Abs Immature Granulocytes: 0.1 10*3/uL (ref 0.0–0.1)
Basophils Absolute: 0 10*3/uL (ref 0.0–0.1)
Basophils Relative: 1 %
Eosinophils Absolute: 0.2 10*3/uL (ref 0.0–0.7)
Eosinophils Relative: 2 %
HCT: 45 % (ref 39.0–52.0)
Hemoglobin: 14 g/dL (ref 13.0–17.0)
Immature Granulocytes: 1 %
Lymphocytes Relative: 27 %
Lymphs Abs: 2 10*3/uL (ref 0.7–4.0)
MCH: 28.1 pg (ref 26.0–34.0)
MCHC: 31.1 g/dL (ref 30.0–36.0)
MCV: 90.4 fL (ref 78.0–100.0)
Monocytes Absolute: 0.7 10*3/uL (ref 0.1–1.0)
Monocytes Relative: 9 %
Neutro Abs: 4.6 10*3/uL (ref 1.7–7.7)
Neutrophils Relative %: 60 %
Platelets: 315 10*3/uL (ref 150–400)
RBC: 4.98 MIL/uL (ref 4.22–5.81)
RDW: 14.3 % (ref 11.5–15.5)
WBC: 7.5 10*3/uL (ref 4.0–10.5)

## 2018-03-26 LAB — BASIC METABOLIC PANEL
Anion gap: 13 (ref 5–15)
BUN: 16 mg/dL (ref 8–23)
CO2: 21 mmol/L — ABNORMAL LOW (ref 22–32)
Calcium: 9.6 mg/dL (ref 8.9–10.3)
Chloride: 105 mmol/L (ref 98–111)
Creatinine, Ser: 0.95 mg/dL (ref 0.61–1.24)
GFR calc Af Amer: >60 mL/min (ref >60)
GFR calc non Af Amer: >60 mL/min (ref >60)
Glucose, Bld: 101 mg/dL — ABNORMAL HIGH (ref 70–99)
Potassium: 4.2 mmol/L (ref 3.5–5.1)
Sodium: 139 mmol/L (ref 135–145)

## 2018-03-26 LAB — GLUCOSE, CAPILLARY
Glucose-Capillary: 105 mg/dL — ABNORMAL HIGH (ref 70–99)
Glucose-Capillary: 115 mg/dL — ABNORMAL HIGH (ref 70–99)
Glucose-Capillary: 98 mg/dL (ref 70–99)
Glucose-Capillary: 112 mg/dL — ABNORMAL HIGH (ref 70–99)

## 2018-03-26 NOTE — Progress Notes (Signed)
[facility] PHYSICAL MEDICINE & REHABILITATION PROGRESS NOTE  Subjective/Complaints: Patient seen lying in bed this morning.  He states he slept well overnight.  He notes improvement in left upper extremity motor strength.  ROS: Denies CP, SOB, nausea, vomiting, diarrhea.  Objective: Vital Signs: Blood pressure 123/86, pulse 70, temperature 97.7 F (36.5 C), temperature source Oral, resp. rate 20, height 5\' 11"  (1.803 m), weight 101.3 kg, SpO2 96 %. No results found. Recent Labs    03/26/18 0553  WBC 7.5  HGB 14.0  HCT 45.0  PLT 315   Recent Labs    03/26/18 0553  NA 139  K 4.2  CL 105  CO2 21*  GLUCOSE 101*  BUN 16  CREATININE 0.95  CALCIUM 9.6    Physical Exam: BP 123/86 (BP Location: Right Arm)   Pulse 70   Temp 97.7 F (36.5 C) (Oral)   Resp 20   Ht 5\' 11"  (1.803 m)   Wt 101.3 kg   SpO2 96%   BMI 31.15 kg/m  Constitutional: No distress . Vital signs reviewed. HENT: Normocephalic.  Atraumatic. Eyes: EOMI. No discharge. Cardiovascular:  RRR.  No JVD. Respiratory: CTA  bilaterally.  Normal effort. GI: BS +. Non-distended. Musc: No edema or tenderness in extremities. Neurological: He is alert and oriented Follows basic commands Motor: RUE/RLE: 5/5 proximal to distal LUE: Shoulder abduction 2-/5, elbow flex 4-/5, elbow extension 2+/5, hand grip 4-4+/5 LLE: HF, KE, ADF 4/5,  stable Left facial droop Skin: Skin is warm and dry.  Psychiatric: He has a normal mood and affect. His behavior is normal. Thought content normal.   Assessment/Plan: 1. Functional deficits secondary to right thalamic hemorrhage which require 3+ hours per day of interdisciplinary therapy in a comprehensive inpatient rehab setting.  Physiatrist is providing close team supervision and 24 hour management of active medical problems listed below.  Physiatrist and rehab team continue to assess barriers to discharge/monitor patient progress toward functional and medical goals  Care  Tool:  Bathing    Body parts bathed by patient: Left arm, Chest, Abdomen, Front perineal area, Right upper leg, Left upper leg, Right lower leg, Left lower leg, Face   Body parts bathed by helper: Right arm, Buttocks     Bathing assist Assist Level: Minimal Assistance - Patient > 75%     Upper Body Dressing/Undressing Upper body dressing   What is the patient wearing?: Pull over shirt    Upper body assist Assist Level: Minimal Assistance - Patient > 75%    Lower Body Dressing/Undressing Lower body dressing      What is the patient wearing?: Pants     Lower body assist Assist for lower body dressing: Minimal Assistance - Patient > 75%     Toileting Toileting    Toileting assist Assist for toileting: 2 Helpers     Transfers Chair/bed transfer  Transfers assist     Chair/bed transfer assist level: Moderate Assistance - Patient 50 - 74%     Locomotion Ambulation   Ambulation assist      Assist level: Maximal Assistance - Patient 25 - 49% Assistive device: (R hall rail) Max distance: 20   Walk 10 feet activity   Assist  Walk 10 feet activity did not occur: Safety/medical concerns  Assist level: Total Assistance - Patient < 25%     Walk 50 feet activity   Assist Walk 50 feet with 2 turns activity did not occur: Safety/medical concerns         Walk  150 feet activity   Assist Walk 150 feet activity did not occur: Safety/medical concerns         Walk 10 feet on uneven surface  activity   Assist Walk 10 feet on uneven surfaces activity did not occur: Safety/medical concerns         Wheelchair     Assist Will patient use wheelchair at discharge?: Yes Type of Wheelchair: Manual    Wheelchair assist level: Supervision/Verbal cueing(R hemi technique) Max wheelchair distance: 150    Wheelchair 50 feet with 2 turns activity    Assist        Assist Level: Supervision/Verbal cueing   Wheelchair 150 feet activity      Assist Wheelchair 150 feet activity did not occur: Safety/medical concerns   Assist Level: Minimal Assistance - Patient > 75%      Medical Problem List and Plan: 1. Left-sided weakness secondary to right thalamic hemorrhage secondary to hypertensive crisis             Cont CIR  2. DVT Prophylaxis/Anticoagulation: Subcutaneous heparin initiated 03/14/2018. Monitor for any bleeding episodes 3. Pain Management: Tylenol as needed 4. Mood: Provide emotional support 5. Neuropsych: This patient is capable of making decisions on his own behalf. 6. Skin/Wound Care: Routine skin checks 7. Fluids/Electrolytes/Nutrition: Routine in and outs              BMP within acceptable range on 10/4 8. PAF with RVR. Cardizem CD 180 mg daily, Lopressor 50 mg twice daily. Cardiac rate controlled. Follow-up cardiology services 9. Diabetes mellitus.Hemoglobin A1c 6.4. Glucophage 850 mg twice daily. Check blood sugars before meals and at bedtime.              CBG (last 3)  RecentLabs(last2labs)       Recent Labs    03/24/18 1203 03/24/18 2201 03/25/18 0658  GLUCAP 106* 108* 115*                Controlled on 10/4 10. Hyperlipidemia. Patient on Lipitor prior to admission 11.  Hypertension                  Vitals:   03/24/18 1942 03/25/18 0505  BP: 128/86 125/89  Pulse: 72 64  Resp: 17 16  Temp: 97.7 F (36.5 C) 98 F (36.7 C)  SpO2: 98% 98%              Continue Lopressor             Controlled on 10/4    LOS: 9 days A FACE TO FACE EVALUATION WAS PERFORMED  Ankit [name] 03/26/2018, 8:59 AM

## 2018-03-26 NOTE — Progress Notes (Signed)
Occupational Therapy Session Note  Patient Details  Name: Marcus Beasley MRN: [id number] Date of Birth: 1946/05/03  Today's Date: 03/26/2018 OT Individual Time: 0800-0900 OT Individual Time Calculation (min): 60 min    Short Term Goals: Week 2:  OT Short Term Goal 1 (Week 2): Pt will don pants with min A using AE PRN OT Short Term Goal 2 (Week 2): Pt will complete 1/3 toileting tasks with no more than mod steadying assist in order to decrease caregiver burden OT Short Term Goal 3 (Week 2): Pt will consistently complete functional transfers with no more than mod A OT Short Term Goal 4 (Week 2): Pt will don shirt with min cuing only  Skilled Therapeutic Interventions/Progress Updates:    Pt seen for OT ADL bathing/dressing session. Pt in supine upon arrival, denying pain and agreeable to tx session. He transferred to EOB with VCs for technique and awareness of positioning of L UE from flat bed in simulation of home environment. He completed squat and stand pivots throughout session, min A squat pivot -mod A stand pivot overall, requiring frequent cuing and instruction for effective movement patterns as well as technique and safety awareness. He bathed seated on BSC in shower. Pt demonstrating improved functional sitting balance needed to lean forward to wash B LEs. Hand over hand assist provided for use of L UE to wash R UE, pt able to use flexor tone to assist with flexion movement, requiring assist to obtain full extension ROM. He required +2 assist for stand pivot out of transfer, transferring to pt's weaker L side. He is able to activate and extend L LE when cued.  He completed grooming tasks seated in w/c at sink, following cuing, pt able to use L UE to assist at stabilizer level when opening containers.  Cont with education and problem solving for hemitechnique for UB dressing. With increased time and cuing pt able to don shirt with cuing only.  LB dressing completed total A for time  management. Stood at sink, mod-max A to maintain standing balance due to L lean with multi-modal cuing and visual feedback of mirror for midline orientation.  Pt left seated in w/c at end of session, hand off to PT.   Therapy Documentation Precautions:  Precautions Precautions: Fall Precaution Comments: L hemiparesis; strong L lateral lean in standing; pushing tendancies Restrictions Weight Bearing Restrictions: No ADL: ADL Grooming: Setup Where Assessed-Grooming: Wheelchair, Sitting at sink Upper Body Bathing: Minimal assistance, Minimal cueing Where Assessed-Upper Body Bathing: Shower Lower Body Bathing: Minimal cueing, Minimal assistance Where Assessed-Lower Body Bathing: Shower Upper Body Dressing: Minimal assistance, Minimal cueing Where Assessed-Upper Body Dressing: Sitting at sink, Wheelchair Lower Body Dressing: Maximal assistance Where Assessed-Lower Body Dressing: Wheelchair, Sitting at sink Tub/Shower Equipment: [facility]) Walk-In Banker Method: Radiographer, therapeutic: Other (comment), Grab bars(BSC)   Therapy/Group: Individual Therapy  Yarimar Lavis L 03/26/2018, 6:50 AM

## 2018-03-26 NOTE — Progress Notes (Signed)
Physical Therapy Session Note  Patient Details  Name: [name] MRN: [id number] Date of Birth: [birth year]  Today's Date: 03/26/2018 PT Individual Time: 0900-1000 PT Individual Time Calculation (min): 60 min   Short Term Goals: Week 2:  PT Short Term Goal 1 (Week 2): Pt will perform bed <> w/c transfers with minAx1 PT Short Term Goal 2 (Week 2): Pt will ambulate 45ft with modAx1 PT Short Term Goal 3 (Week 2): Pt will statically stand for 29min with midline orientation PT Short Term Goal 4 (Week 2): Pt will initiate stair training  Skilled Therapeutic Interventions/Progress Updates:    PT received seated in w/c and motivated for PT. Pt propelled w/c to rehab gym with R hemi technique with S. Squat pivot transfer with min/modA and verbal cues for set up, sequencing, and safety throughout session. Seated forward and lateral reaching to the R to promote anterior and R weightshift, initiate sit>stand, and decrease pushing/leaning to the L. 4 trials of reaching for one horseshoe at a time with each sit>stand with modAx1. 2 trials of standing weightshifts to R with modAx1. Pt exhibited better midline control (able to hold ~20 secs) in standing and R reaching simultaneously, but required mod verbal cues to maintain weightshift for midline orientation. Tall kneeling with BUE support on bench with R reaching to better initiate R weightshifts and to facilitate glute activation; requires verbal/tactle/visual cueing for R weight shifting, reduced pushing with RLE, glute activation, and posture. MaxAx1 ambulation x2 trials at ~67ft; visual feedback with mirror for postural alignment, repetitive verbal/tactile cueing for R weightshift, LLE placement, and LLE stance control to reduce buckling/recurvatum. TotalA w/c transport return to room. Pt remained in bed at end of session with bed alarm intact and all needs in reach.   Therapy Documentation Precautions:  Precautions Precautions: Fall Precaution  Comments: L hemiparesis; strong L lateral lean in standing; pushing tendancies Restrictions Weight Bearing Restrictions: No Pain: Pain Assessment Pain Scale: 0-10 Pain Score: 0-No pain   Therapy/Group: Individual Therapy  [place] Anabela Crayton, SPT 03/26/2018, 10:17 AM

## 2018-03-26 NOTE — Progress Notes (Signed)
Occupational Therapy Session Note  Patient Details  Name: Marcus Beasley MRN: [id number] Date of Birth: 1946-01-29  Today's Date: 03/26/2018 OT Individual Time: 1040-1110 OT Individual Time Calculation (min): 30 min    Short Term Goals: Week 1:  OT Short Term Goal 1 (Week 1): Pt will maintain dynamic sitting balance on EOB/EOM with supervision in prep for functional tasks  OT Short Term Goal 1 - Progress (Week 1): Met OT Short Term Goal 2 (Week 1): Pt will complete UB dressing with min VC'ing OT Short Term Goal 2 - Progress (Week 1): Not met OT Short Term Goal 3 (Week 1): Pt will perform toilet transfer with max A + 1 in order to decrease caregiver burden  OT Short Term Goal 3 - Progress (Week 1): Met OT Short Term Goal 4 (Week 1): Pt will thread BLEs into pants seated in w/c with CGA  OT Short Term Goal 4 - Progress (Week 1): Not met Week 2:  OT Short Term Goal 1 (Week 2): Pt will don pants with min A using AE PRN OT Short Term Goal 2 (Week 2): Pt will complete 1/3 toileting tasks with no more than mod steadying assist in order to decrease caregiver burden OT Short Term Goal 3 (Week 2): Pt will consistently complete functional transfers with no more than mod A OT Short Term Goal 4 (Week 2): Pt will don shirt with min cuing only      Skilled Therapeutic Interventions/Progress Updates:    Pt received in bed stating he would like to shave.  Pt initially tried to pull into sitting using R arm on rails. Had pt start over so he could practice supine to sidelying to sit with min A to bend L knee and guiding A for pt to use R hand to support L arm.  Then pt rolled to R side and pushed up to sit with guiding A.  He worked on squat pivot to L side in 2 steps with min A to lean forward and lift hips for adequate clearance. Once in the chair, pt set up at the sink to shave.  He needed A with opening containers and applying shaving cream, but was then able to shave himself using good safety awareness  and L side awareness.  Pt then needed to use the urinal, did so with A to hold urinal. For LUE NMR, focused on scapular P and AROM with scapular glides. Pt has a tendency to try to move his entire body so he needs quite a bit of cuing to slow down and focus on just his arm moving.  Due to tone in bicep with decreased tricep extension focused on slow extended reaching of elbow in PNF D1 pattern and extending arm forward with mod A.  Pt's ROM improved with only trace bicep tone.   Pt resting in w/c with chair belt alarm on and call light in reach.   Therapy Documentation Precautions:  Precautions Precautions: Fall Precaution Comments: L hemiparesis; strong L lateral lean in standing; pushing tendancies Restrictions Weight Bearing Restrictions: No       Pain: Pain Assessment Pain Scale: 0-10 Pain Score: 0-No pain ADL: ADL Grooming: Setup Where Assessed-Grooming: Wheelchair, Sitting at sink Upper Body Bathing: Minimal assistance, Minimal cueing Where Assessed-Upper Body Bathing: Shower Lower Body Bathing: Minimal cueing, Minimal assistance Where Assessed-Lower Body Bathing: Shower Upper Body Dressing: Minimal assistance, Minimal cueing Where Assessed-Upper Body Dressing: Sitting at sink, Wheelchair Lower Body Dressing: Maximal assistance Where Assessed-Lower Body Dressing:  Wheelchair, Sitting at sink Toileting: Moderate assistance(Standing in STEDY) Where Assessed-Toileting: Glass blower/designer: Moderate verbal cueing, Dependent Toilet Transfer Method: Squat pivot(To toilet: squat pivot mod A + 2; STEDY off of toilet) Toilet Transfer Equipment: Raised toilet seat, Grab bars Tub/Shower Transfer: (STEDY) Tub/Shower Equipment: (BSC) Walk-In Shower Transfer Method: Radiographer, therapeutic: Other (comment), Grab bars(BSC)      Therapy/Group: Individual Therapy  Nyeemah Jennette 03/26/2018, 11:27 AM

## 2018-03-26 NOTE — Progress Notes (Signed)
Physical Therapy Session Note  Patient Details  Name: Marcus Beasley MRN: [id number] Date of Birth: 10/27/1945  Today's Date: 03/26/2018 PT Individual Time: 1330-1520 PT Individual Time Calculation (min): 110 min   Short Term Goals: Week 2:  PT Short Term Goal 1 (Week 2): Pt will perform bed <> w/c transfers with minAx1 PT Short Term Goal 2 (Week 2): Pt will ambulate 56ft with modAx1 PT Short Term Goal 3 (Week 2): Pt will statically stand for 39min with midline orientation PT Short Term Goal 4 (Week 2): Pt will initiate stair training  Skilled Therapeutic Interventions/Progress Updates: Pt received in w/c, denies pain and agreeable to treatment. Transfer bed>w/c with minA. Transported to/from gym [place]. Sitting balance with pressure mapping to provide visual feedback for weight bearing through pelvis, transitioned into sit>stand with RUE reaching to R. Attempted standing on wedges to facilitate L knee extension, R knee flexion; demo's slightly improved midline orientation. LLE hip/knee extension off L side of mat table with cues for sit >stand from elevated table; pt quickly learns to compensate with R thigh on mat table. R propped on elbow with LEs on mat for R trunk ROM. Gait with litegait and +3 x30', x15' with RUE reaching to R side to facilitate R weight shifting. Returned to room in w/c; transferred to bed with minA. Remained in bed, all needs in reach.      Therapy Documentation Precautions:  Precautions Precautions: Fall Precaution Comments: L hemiparesis; strong L lateral lean in standing; pushing tendancies Restrictions Weight Bearing Restrictions: No    Therapy/Group: Individual Therapy  Corliss Skains 03/26/2018, 3:44 PM

## 2018-03-27 ENCOUNTER — Inpatient Hospital Stay (HOSPITAL_COMMUNITY): Payer: Medicare Other

## 2018-03-27 DIAGNOSIS — R0989 Other specified symptoms and signs involving the circulatory and respiratory systems: Secondary | ICD-10-CM

## 2018-03-27 LAB — GLUCOSE, CAPILLARY
Glucose-Capillary: 104 mg/dL — ABNORMAL HIGH (ref 70–99)
Glucose-Capillary: 117 mg/dL — ABNORMAL HIGH (ref 70–99)
Glucose-Capillary: 102 mg/dL — ABNORMAL HIGH (ref 70–99)
Glucose-Capillary: 135 mg/dL — ABNORMAL HIGH (ref 70–99)

## 2018-03-27 NOTE — Progress Notes (Signed)
Occupational Therapy Session Note  Patient Details  Name: Marcus Beasley MRN: [id number] Date of Birth: September 16, 1945  Today's Date: 03/27/2018 OT Individual Time: 1300-1335 OT Individual Time Calculation (min): 35 min    Short Term Goals: Week 2:  OT Short Term Goal 1 (Week 2): Pt will don pants with min A using AE PRN OT Short Term Goal 2 (Week 2): Pt will complete 1/3 toileting tasks with no more than mod steadying assist in order to decrease caregiver burden OT Short Term Goal 3 (Week 2): Pt will consistently complete functional transfers with no more than mod A OT Short Term Goal 4 (Week 2): Pt will don shirt with min cuing only  Skilled Therapeutic Interventions/Progress Updates:    1:1. Pt received seated in bed with no co pain. OT demo use of 1 handed technqiue and elevation of foot on trash can to don non skid sock. Pt able to don R sock however unable to don L sock over toes. Pt completes w/c mobility with hemi technqiue with MOD VC for steering. Pt completes 1x15 AAROM from OT with tactile cues at scapula: pro/retraction, int/ext rotation, horizontal ab/adduct, elbow/shoulder and wrist flex/ext. Exited session with pt seated in bed with call light in reach and all needs met  Therapy Documentation Precautions:  Precautions Precautions: Fall Precaution Comments: L hemiparesis; strong L lateral lean in standing; pushing tendancies Restrictions Weight Bearing Restrictions: No General:   Therapy/Group: Individual Therapy  [name] 03/27/2018, 5:15 PM

## 2018-03-27 NOTE — Progress Notes (Signed)
[facility] PHYSICAL MEDICINE & REHABILITATION PROGRESS NOTE  Subjective/Complaints: Patient seen sitting up in bed this morning.  He states he slept well overnight and he feels better but his wife is here today.  ROS: Denies CP, SOB, nausea, vomiting, diarrhea.  Objective: Vital Signs: Blood pressure 99/72, pulse 67, temperature 98.4 F (36.9 C), temperature source Oral, resp. rate 17, height 5\' 11"  (1.803 m), weight 101.3 kg, SpO2 97 %. No results found. Recent Labs    03/26/18 0553  WBC 7.5  HGB 14.0  HCT 45.0  PLT 315   Recent Labs    03/26/18 0553  NA 139  K 4.2  CL 105  CO2 21*  GLUCOSE 101*  BUN 16  CREATININE 0.95  CALCIUM 9.6    Physical Exam: BP 99/72 (BP Location: Right Arm)   Pulse 67   Temp 98.4 F (36.9 C) (Oral)   Resp 17   Ht 5\' 11"  (1.803 m)   Wt 101.3 kg   SpO2 97%   BMI 31.15 kg/m  Constitutional: No distress . Vital signs reviewed. HENT: Normocephalic.  Atraumatic. Eyes: EOMI. No discharge. Cardiovascular:  RRR.  No JVD. Respiratory: CTA  bilaterally.  Normal effort. GI: BS +. Non-distended. Musc: No edema or tenderness in extremities. Neurological: He is alert and oriented Follows basic commands Motor: RUE/RLE: 5/5 proximal to distal LUE: Shoulder abduction 2-/5, elbow flex 4-/5, elbow extension 2+/5, hand grip 4-4+/5, stable LLE: HF, KE, ADF 4/5,  stable Left facial droop Skin: Skin is warm and dry.  Psychiatric: He has a normal mood and affect. His behavior is normal. Thought content normal.   Assessment/Plan: 1. Functional deficits secondary to right thalamic hemorrhage which require 3+ hours per day of interdisciplinary therapy in a comprehensive inpatient rehab setting.  Physiatrist is providing close team supervision and 24 hour management of active medical problems listed below.  Physiatrist and rehab team continue to assess barriers to discharge/monitor patient progress toward functional and medical goals  Care  Tool:  Bathing    Body parts bathed by patient: Left arm, Chest, Abdomen, Front perineal area, Right upper leg, Left upper leg, Right lower leg, Left lower leg, Face   Body parts bathed by helper: Right arm, Buttocks     Bathing assist Assist Level: Minimal Assistance - Patient > 75%     Upper Body Dressing/Undressing Upper body dressing   What is the patient wearing?: Pull over shirt    Upper body assist Assist Level: Minimal Assistance - Patient > 75%    Lower Body Dressing/Undressing Lower body dressing      What is the patient wearing?: Pants     Lower body assist Assist for lower body dressing: Minimal Assistance - Patient > 75%     Toileting Toileting    Toileting assist Assist for toileting: Moderate Assistance - Patient 50 - 74%     Transfers Chair/bed transfer  Transfers assist     Chair/bed transfer assist level: Moderate Assistance - Patient 50 - 74%     Locomotion Ambulation   Ambulation assist      Assist level: Maximal Assistance - Patient 25 - 49% Assistive device: (R hall rail) Max distance: 25   Walk 10 feet activity   Assist  Walk 10 feet activity did not occur: Safety/medical concerns  Assist level: Maximal Assistance - Patient 25 - 49% Assistive device: (R hall rail)   Walk 50 feet activity   Assist Walk 50 feet with 2 turns activity did not occur:  Safety/medical concerns         Walk 150 feet activity   Assist Walk 150 feet activity did not occur: Safety/medical concerns         Walk 10 feet on uneven surface  activity   Assist Walk 10 feet on uneven surfaces activity did not occur: Safety/medical concerns         Wheelchair     Assist Will patient use wheelchair at discharge?: Yes Type of Wheelchair: Manual    Wheelchair assist level: Supervision/Verbal cueing Max wheelchair distance: 150    Wheelchair 50 feet with 2 turns activity    Assist        Assist Level: Supervision/Verbal  cueing   Wheelchair 150 feet activity     Assist Wheelchair 150 feet activity did not occur: Safety/medical concerns   Assist Level: Supervision/Verbal cueing      Medical Problem List and Plan: 1. Left-sided weakness secondary to right thalamic hemorrhage secondary to hypertensive crisis             Cont CIR  2. DVT Prophylaxis/Anticoagulation: Subcutaneous heparin initiated 03/14/2018. Monitor for any bleeding episodes 3. Pain Management: Tylenol as needed 4. Mood: Provide emotional support 5. Neuropsych: This patient is capable of making decisions on his own behalf. 6. Skin/Wound Care: Routine skin checks 7. Fluids/Electrolytes/Nutrition: Routine in and outs              BMP within acceptable range on 10/4 8. PAF with RVR. Cardizem CD 180 mg daily, Lopressor 50 mg twice daily. Cardiac rate controlled. Follow-up cardiology services 9. Diabetes mellitus.Hemoglobin A1c 6.4. Glucophage 850 mg twice daily. Check blood sugars before meals and at bedtime.              CBG (last 3)  Recent Labs    03/26/18 2134 03/27/18 0651 03/27/18 1135  GLUCAP 112* 104* 117*               Relatively controlled on 10/5 10. Hyperlipidemia. Patient on Lipitor prior to admission 11.  Hypertension Today's Vitals   03/27/18 0000 03/27/18 0148 03/27/18 0300 03/27/18 0628  BP:    99/72  Pulse:    67  Resp:      Temp:    98.4 F (36.9 C)  TempSrc:    Oral  SpO2:    97%  Weight:      Height:      PainSc: Asleep Asleep Asleep    Body mass index is 31.15 kg/m.             Continue Lopressor             Labile on 10/5    LOS: 10 days A FACE TO FACE EVALUATION WAS PERFORMED  Ankit [name] 03/27/2018, 12:24 PM

## 2018-03-28 ENCOUNTER — Inpatient Hospital Stay (HOSPITAL_COMMUNITY): Payer: Medicare Other

## 2018-03-28 LAB — GLUCOSE, CAPILLARY
Glucose-Capillary: 97 mg/dL (ref 70–99)
Glucose-Capillary: 116 mg/dL — ABNORMAL HIGH (ref 70–99)
Glucose-Capillary: 128 mg/dL — ABNORMAL HIGH (ref 70–99)
Glucose-Capillary: 101 mg/dL — ABNORMAL HIGH (ref 70–99)

## 2018-03-28 NOTE — Progress Notes (Signed)
Occupational Therapy Session Note  Patient Details  Name: Marcus Beasley MRN: [id number] Date of Birth: Jan 02, 1946  Today's Date: 03/28/2018 OT Individual Time: 3903-0092 OT Individual Time Calculation (min): 41 min    Short Term Goals: Week 1:  OT Short Term Goal 1 (Week 1): Pt will maintain dynamic sitting balance on EOB/EOM with supervision in prep for functional tasks  OT Short Term Goal 1 - Progress (Week 1): Met OT Short Term Goal 2 (Week 1): Pt will complete UB dressing with min VC'ing OT Short Term Goal 2 - Progress (Week 1): Not met OT Short Term Goal 3 (Week 1): Pt will perform toilet transfer with max A + 1 in order to decrease caregiver burden  OT Short Term Goal 3 - Progress (Week 1): Met OT Short Term Goal 4 (Week 1): Pt will thread BLEs into pants seated in w/c with CGA  OT Short Term Goal 4 - Progress (Week 1): Not met  Skilled Therapeutic Interventions/Progress Updates:    1;1. Pt recived seated in bed with no c/o pain. Pt supine>sitting EOB with heavy use of bed rails and A for LLE management. Pt squat pivot transfer throughout session iwht min A and VC for adjusting L foot flat onto floor prior to transfer. Pt completes trunk control activities with CGA: anterior/posterior to wedge for modified sit ups, WB into L forearm and pushing in wrist for tricep activaiton, and tapping R elbow onto mat and sitting upright to improve L oblique activation. Pt completes 1x15 scapular elevation/depression with manual facilitation of OT. After set, pt able to do 5 reps with equal activation and tapping on trapezius for tactile cue. Pt practices grasp/releaase of cup, pronation to "pour" cup, and lateral reaching with MOD A for manual facilitation of shoulder movements. Pt requires mod A to release items and VC to not grip so strongly as to break cup. Exited session with pt seated in w/c, call light in reach and family present to supervise.   Therapy Documentation Precautions:   Precautions Precautions: Fall Precaution Comments: L hemiparesis; strong L lateral lean in standing; pushing tendancies Restrictions Weight Bearing Restrictions: No General:    Therapy/Group: Individual Therapy  [name] 03/28/2018, 3:46 PM

## 2018-03-28 NOTE — Progress Notes (Signed)
[facility] PHYSICAL MEDICINE & REHABILITATION PROGRESS NOTE  Subjective/Complaints: Patient seen laying in bed this AM.  He states he slept well overnight.  He requests an increase frequency to [employer].  ROS: Denies CP, SOB, nausea, vomiting, diarrhea.  Objective: Vital Signs: Blood pressure 107/82, pulse 73, temperature 98.5 F (36.9 C), temperature source Oral, resp. rate 16, height 5\' 11"  (1.803 m), weight 101.3 kg, SpO2 98 %. No results found. Recent Labs    03/26/18 0553  WBC 7.5  HGB 14.0  HCT 45.0  PLT 315   Recent Labs    03/26/18 0553  NA 139  K 4.2  CL 105  CO2 21*  GLUCOSE 101*  BUN 16  CREATININE 0.95  CALCIUM 9.6    Physical Exam: BP 107/82   Pulse 73   Temp 98.5 F (36.9 C) (Oral)   Resp 16   Ht 5\' 11"  (1.803 m)   Wt 101.3 kg   SpO2 98%   BMI 31.15 kg/m  Constitutional: No distress . Vital signs reviewed. HENT: Normocephalic.  Atraumatic. Eyes: EOMI. No discharge. Cardiovascular:  RRR.  No JVD. Respiratory: CTA  bilaterally.  Normal effort. GI: BS +. Non-distended. Musc: No edema or tenderness in extremities. Neurological: He is alert and oriented Follows basic commands Motor: RUE/RLE: 5/5 proximal to distal LUE: Shoulder abduction 2-/5, elbow flex 4-/5, elbow extension 2+/5, hand grip 4-4+/5, stable LLE: HF, KE, ADF 4/5,  stable Left facial droop Skin: Skin is warm and dry.  Psychiatric: He has a normal mood and affect. His behavior is normal. Thought content normal.   Assessment/Plan: 1. Functional deficits secondary to right thalamic hemorrhage which require 3+ hours per day of interdisciplinary therapy in a comprehensive inpatient rehab setting.  Physiatrist is providing close team supervision and 24 hour management of active medical problems listed below.  Physiatrist and rehab team continue to assess barriers to discharge/monitor patient progress toward functional and medical goals  Care Tool:  Bathing    Body parts  bathed by patient: Left arm, Chest, Abdomen, Front perineal area, Right upper leg, Left upper leg, Right lower leg, Left lower leg, Face   Body parts bathed by helper: Right arm, Buttocks     Bathing assist Assist Level: Minimal Assistance - Patient > 75%     Upper Body Dressing/Undressing Upper body dressing   What is the patient wearing?: Pull over shirt    Upper body assist Assist Level: Minimal Assistance - Patient > 75%    Lower Body Dressing/Undressing Lower body dressing      What is the patient wearing?: Pants     Lower body assist Assist for lower body dressing: Minimal Assistance - Patient > 75%     Toileting Toileting    Toileting assist Assist for toileting: Moderate Assistance - Patient 50 - 74%     Transfers Chair/bed transfer  Transfers assist     Chair/bed transfer assist level: Moderate Assistance - Patient 50 - 74%     Locomotion Ambulation   Ambulation assist      Assist level: Maximal Assistance - Patient 25 - 49% Assistive device: (R hall rail) Max distance: 25   Walk 10 feet activity   Assist  Walk 10 feet activity did not occur: Safety/medical concerns  Assist level: Maximal Assistance - Patient 25 - 49% Assistive device: (R hall rail)   Walk 50 feet activity   Assist Walk 50 feet with 2 turns activity did not occur: Safety/medical concerns  Walk 150 feet activity   Assist Walk 150 feet activity did not occur: Safety/medical concerns         Walk 10 feet on uneven surface  activity   Assist Walk 10 feet on uneven surfaces activity did not occur: Safety/medical concerns         Wheelchair     Assist Will patient use wheelchair at discharge?: Yes Type of Wheelchair: Manual    Wheelchair assist level: Supervision/Verbal cueing Max wheelchair distance: 150    Wheelchair 50 feet with 2 turns activity    Assist        Assist Level: Supervision/Verbal cueing   Wheelchair 150 feet  activity     Assist Wheelchair 150 feet activity did not occur: Safety/medical concerns   Assist Level: Supervision/Verbal cueing      Medical Problem List and Plan: 1. Left-sided weakness secondary to right thalamic hemorrhage secondary to hypertensive crisis             Cont CIR  2. DVT Prophylaxis/Anticoagulation: Subcutaneous heparin initiated 03/14/2018. Monitor for any bleeding episodes 3. Pain Management: Tylenol as needed 4. Mood: Provide emotional support 5. Neuropsych: This patient is capable of making decisions on his own behalf. 6. Skin/Wound Care: Routine skin checks 7. Fluids/Electrolytes/Nutrition: Routine in and outs              BMP within acceptable range on 10/4 8. PAF with RVR. Cardizem CD 180 mg daily, Lopressor 50 mg twice daily. Cardiac rate controlled. Follow-up cardiology services 9. Diabetes mellitus.Hemoglobin A1c 6.4. Glucophage 850 mg twice daily. Check blood sugars before meals and at bedtime.              CBG (last 3)  Recent Labs    03/28/18 0635 03/28/18 1133 03/28/18 1702  GLUCAP 97 116* 128*               Relatively controlled on 10/6 10. Hyperlipidemia. Patient on Lipitor prior to admission 11.  Hypertension Today's Vitals   03/28/18 0218 03/28/18 0541 03/28/18 1148 03/28/18 1316  BP:  113/84  107/82  Pulse:  (!) 25  73  Resp:  16    Temp:  97.8 F (36.6 C)  98.5 F (36.9 C)  TempSrc:  Oral  Oral  SpO2:  98%  98%  Weight:      Height:      PainSc: Asleep  5     Body mass index is 31.15 kg/m.             Continue Lopressor             Labile on 10/6    LOS: 11 days A FACE TO FACE EVALUATION WAS PERFORMED  Ankit [name] 03/28/2018, 5:39 PM

## 2018-03-29 ENCOUNTER — Other Ambulatory Visit: Payer: Self-pay | Admitting: Internal Medicine

## 2018-03-29 ENCOUNTER — Inpatient Hospital Stay (HOSPITAL_COMMUNITY): Payer: Medicare Other | Admitting: Physical Therapy

## 2018-03-29 ENCOUNTER — Inpatient Hospital Stay (HOSPITAL_COMMUNITY): Payer: Medicare Other | Admitting: Occupational Therapy

## 2018-03-29 LAB — GLUCOSE, CAPILLARY
Glucose-Capillary: 118 mg/dL — ABNORMAL HIGH (ref 70–99)
Glucose-Capillary: 126 mg/dL — ABNORMAL HIGH (ref 70–99)
Glucose-Capillary: 114 mg/dL — ABNORMAL HIGH (ref 70–99)
Glucose-Capillary: 113 mg/dL — ABNORMAL HIGH (ref 70–99)

## 2018-03-29 NOTE — Progress Notes (Signed)
[facility] PHYSICAL MEDICINE & REHABILITATION PROGRESS NOTE  Subjective/Complaints: Patient seen laying in bed this morning.  He states he did not sleep well overnight because he was constipated first and then took some medication and had bowel movements.  ROS: Denies CP, SOB, nausea, vomiting, diarrhea.  Objective: Vital Signs: Blood pressure (!) 128/97, pulse (!) 143, temperature 98.5 F (36.9 C), resp. rate 18, height 5\' 11"  (1.803 m), weight 101.3 kg, SpO2 98 %. No results found. No results for input(s): WBC, HGB, HCT, PLT in the last 72 hours. No results for input(s): NA, K, CL, CO2, GLUCOSE, BUN, CREATININE, CALCIUM in the last 72 hours.  Physical Exam: BP (!) 128/97 (BP Location: Right Arm)   Pulse (!) 143   Temp 98.5 F (36.9 C)   Resp 18   Ht 5\' 11"  (1.803 m)   Wt 101.3 kg   SpO2 98%   BMI 31.15 kg/m  Constitutional: No distress . Vital signs reviewed. HENT: Normocephalic.  Atraumatic. Eyes: EOMI. No discharge. Cardiovascular:  RRR.  No JVD. Respiratory: CTA  bilaterally.  Normal effort. GI: BS +. Non-distended. Musc: No edema or tenderness in extremities. Neurological: He is alert and oriented Follows basic commands Motor: RUE/RLE: 5/5 proximal to distal LUE: Shoulder abduction 2-/5, elbow flex 4-/5, elbow extension 2+/5, hand grip 4-4+/5, unchanged LLE: HF, KE, ADF 4/5,  unchanged Left facial droop Skin: Skin is warm and dry.  Psychiatric: He has a normal mood and affect. His behavior is normal. Thought content normal.   Assessment/Plan: 1. Functional deficits secondary to right thalamic hemorrhage which require 3+ hours per day of interdisciplinary therapy in a comprehensive inpatient rehab setting.  Physiatrist is providing close team supervision and 24 hour management of active medical problems listed below.  Physiatrist and rehab team continue to assess barriers to discharge/monitor patient progress toward functional and medical goals  Care  Tool:  Bathing    Body parts bathed by patient: Left arm, Chest, Abdomen, Front perineal area, Right upper leg, Left upper leg, Right lower leg, Left lower leg, Face   Body parts bathed by helper: Right arm, Buttocks     Bathing assist Assist Level: Moderate Assistance - Patient 50 - 74%     Upper Body Dressing/Undressing Upper body dressing   What is the patient wearing?: Pull over shirt    Upper body assist Assist Level: Minimal Assistance - Patient > 75%    Lower Body Dressing/Undressing Lower body dressing      What is the patient wearing?: Pants     Lower body assist Assist for lower body dressing: Moderate Assistance - Patient 50 - 74%     Toileting Toileting    Toileting assist Assist for toileting: 2 Helpers     Transfers Chair/bed transfer  Transfers assist     Chair/bed transfer assist level: 2 Helpers     Locomotion Ambulation   Ambulation assist      Assist level: Maximal Assistance - Patient 25 - 49% Assistive device: (R hall rail) Max distance: 25   Walk 10 feet activity   Assist  Walk 10 feet activity did not occur: Safety/medical concerns  Assist level: Maximal Assistance - Patient 25 - 49% Assistive device: (R hall rail)   Walk 50 feet activity   Assist Walk 50 feet with 2 turns activity did not occur: Safety/medical concerns         Walk 150 feet activity   Assist Walk 150 feet activity did not occur: Safety/medical concerns  Walk 10 feet on uneven surface  activity   Assist Walk 10 feet on uneven surfaces activity did not occur: Safety/medical concerns         Wheelchair     Assist Will patient use wheelchair at discharge?: Yes Type of Wheelchair: Manual    Wheelchair assist level: Supervision/Verbal cueing Max wheelchair distance: 150    Wheelchair 50 feet with 2 turns activity    Assist        Assist Level: Supervision/Verbal cueing   Wheelchair 150 feet activity      Assist Wheelchair 150 feet activity did not occur: Safety/medical concerns   Assist Level: Supervision/Verbal cueing      Medical Problem List and Plan: 1. Left-sided weakness secondary to right thalamic hemorrhage secondary to hypertensive crisis             Cont CIR  2. DVT Prophylaxis/Anticoagulation: Subcutaneous heparin initiated 03/14/2018. Monitor for any bleeding episodes 3. Pain Management: Tylenol as needed 4. Mood: Provide emotional support 5. Neuropsych: This patient is capable of making decisions on his own behalf. 6. Skin/Wound Care: Routine skin checks 7. Fluids/Electrolytes/Nutrition: Routine in and outs              BMP within acceptable range on 10/4 8. PAF with RVR. Cardizem CD 180 mg daily, Lopressor 50 mg twice daily. Cardiac rate controlled. Follow-up cardiology services 9. Diabetes mellitus.Hemoglobin A1c 6.4. Glucophage 850 mg twice daily. Check blood sugars before meals and at bedtime.              CBG (last 3)  Recent Labs    03/28/18 1702 03/28/18 2043 03/29/18 0628  GLUCAP 128* 101* 118*               Relatively controlled on 10/7 10. Hyperlipidemia. Patient on Lipitor prior to admission 11.  Hypertension Today's Vitals   03/28/18 2058 03/28/18 2158 03/28/18 2311 03/29/18 0357  BP:    (!) 128/97  Pulse:    (!) 143  Resp:    18  Temp:    98.5 F (36.9 C)  TempSrc:      SpO2:    98%  Weight:      Height:      PainSc: 7  Asleep 0-No pain    Body mass index is 31.15 kg/m.  Continue Lopressor            Relatively controlled on 10/7, heart rate likely erroneous recording    LOS: 12 days A FACE TO FACE EVALUATION WAS PERFORMED  Antavion Bartoszek [name] 03/29/2018, 8:32 AM

## 2018-03-29 NOTE — Progress Notes (Signed)
Physical Therapy Session Note  Patient Details  Name: Marcus Beasley MRN: [id number] Date of Birth: Sep 08, 1945  Today's Date: 03/29/2018 PT Individual Time: 1400-1500 PT Individual Time Calculation (min): 60 min   Short Term Goals: Week 2:  PT Short Term Goal 1 (Week 2): Pt will perform bed <> w/c transfers with minAx1 PT Short Term Goal 2 (Week 2): Pt will ambulate 43ft with modAx1 PT Short Term Goal 3 (Week 2): Pt will statically stand for 67min with midline orientation PT Short Term Goal 4 (Week 2): Pt will initiate stair training  Skilled Therapeutic Interventions/Progress Updates:    Pt received laying in bed with nurse and nursing student present. Min/modA stand pivot transfers throughout session bed<>w/c with set up assist. Gait trials performed in a variety of ways that required varying assist levels due to RUE support and pushing tendencies. Trial 1 - MinA ambulation x 50 ft with hall rail on R; Trial 2 - MaxA ambulation x 20 ft with hall rail and HHA; Trial 3 - Ambulation in LiteGait with 3 helpers (1 to steer); Each trial required: assist with LLE placement, knee control to prevent buckling/hyperextension, and tactile/verbal cues for weightshifting to R, weightbearing on L, appropriate R swing through, and midline/upright posture. Standing SLS on LLE in LiteGait to facilitate LLE weightbearing and quad activation for knee control; required maxA to keep RLE flexed above 90deg to inhibit pushing, as pt typically pushes with RLE to the L to weightbear through LLE. TotalA w/c transport return to room for energy/time conservation. Pt remained in bed with HOB elevated, family/friend members present, and all needs in reach. All four bedrails up per pt request.   Therapy Documentation Precautions:  Precautions Precautions: Fall Precaution Comments: L hemiparesis; strong L lateral lean in standing; pushing tendancies Restrictions Weight Bearing Restrictions: No Vital Signs: Therapy  Vitals Temp: 98.9 F (37.2 C) Temp Source: Oral Pulse Rate: 90 Resp: 16 BP: 108/79 Patient Position (if appropriate): Lying Oxygen Therapy SpO2: 98 % O2 Device: Room Air Pain: Pain Assessment Pain Scale: 0-10 Pain Score: 2  Pain Type: Acute pain Pain Location: Shoulder Pain Orientation: Right;Left Pain Descriptors / Indicators: Aching Pain Intervention(s): Medication (See eMAR)    Therapy/Group: Individual Therapy  [place] Raziel Koenigs, SPT 03/29/2018, 3:51 PM

## 2018-03-29 NOTE — Progress Notes (Signed)
Occupational Therapy Session Note  Patient Details  Name: Marcus Beasley MRN: [id number] Date of Birth: [birth year]  Today's Date: 03/29/2018 OT Individual Time: 0845-1000 OT Individual Time Calculation (min): 75 min    Short Term Goals: Week 2:  OT Short Term Goal 1 (Week 2): Pt will don pants with min A using AE PRN OT Short Term Goal 2 (Week 2): Pt will complete 1/3 toileting tasks with no more than mod steadying assist in order to decrease caregiver burden OT Short Term Goal 3 (Week 2): Pt will consistently complete functional transfers with no more than mod A OT Short Term Goal 4 (Week 2): Pt will don shirt with min cuing only  Skilled Therapeutic Interventions/Progress Updates:    Pt supine in bed upon entry with no reports of pain and eager to begin shower. Pt performed squat pivot and stand pivot transfers this session with mod A for balance with mod-max VC'ing required for foot and hand positioning. Pt transferred to shower and performed all bathing tasks while seated using lateral leans for peri hygiene. Pt educated on hemi technique to bathe RUE with LUE. Pt able to complete however difficulties present with reaching armpit requiring therapist to assist. Pt transferred to w/c and performed dressing and grooming tasks at sink. Pt with notable improvements in hemi technique and able to complete with min VC's for pulling shirt up LUE. Pt able to don RLE sock and shoe with foot placed on elevated surface however unable to don LLE footwear despite multiple attempts. Elastic shoe laces provided to pt in order to decrease assistance needed for footwear. Pt unthreaded laces from shoes with RUE and LUE as a stabilizing assist on shoe with min A from therapist for obtaining hand positioning and stabilizing shoe. Initially tried to perform with LUE however unable due to pt not having finger isolation and flexion with [place]. Pt left in room with all needs in reach and chair belt set.  Therapy  Documentation Precautions:  Precautions Precautions: Fall Precaution Comments: L hemiparesis; strong L lateral lean in standing; pushing tendancies Restrictions Weight Bearing Restrictions: No ADL: ADL Equipment Provided: Other (comment)(elastic shoe laces) Grooming: Setup Where Assessed-Grooming: Wheelchair Upper Body Bathing: Minimal assistance, Minimal cueing Where Assessed-Upper Body Bathing: Shower Lower Body Bathing: Minimal cueing, Minimal assistance Where Assessed-Lower Body Bathing: Shower Upper Body Dressing: Supervision/safety Where Assessed-Upper Body Dressing: Wheelchair, Sitting at sink Lower Body Dressing: Moderate assistance, Moderate cueing Where Assessed-Lower Body Dressing: Wheelchair, Sitting at sink [employer]: Moderate assistance, Moderate cueing Social research officer, government Method: Radiographer, therapeutic: Other (comment), Grab bars(BSC )  Therapy/Group: Individual Therapy  Shevawn Langenberg 03/29/2018, 12:35 PM

## 2018-03-29 NOTE — Progress Notes (Addendum)
Physical Therapy Session Note  Patient Details  Name: Marcus Beasley MRN: [id number] Date of Birth: March 27, 1946  Today's Date: 03/29/2018 PT Individual Time: 1300-1330 PT Individual Time Calculation (min): 30 min   Short Term Goals: Week 2:  PT Short Term Goal 1 (Week 2): Pt will perform bed <> w/c transfers with minAx1 PT Short Term Goal 2 (Week 2): Pt will ambulate 41ft with modAx1 PT Short Term Goal 3 (Week 2): Pt will statically stand for 62min with midline orientation PT Short Term Goal 4 (Week 2): Pt will initiate stair training  Skilled Therapeutic Interventions/Progress Updates:    Pt received semi-reclined in bed, agreeable to PT. Pt reports some general body soreness, not rated and requests muscle cream from RN at end of therapy session. Supine to sit with min A with use of bedrail. Stedy transfer bed to toilet. Pt is SBA to stand from stedy seat height and min A from toilet seat height. Pt requires assist for clothing management and pericare following toileting. Manual w/c propulsion x 50 ft with R UE/LE and Supervision before onset of fatigue. Squat pivot transfer w/c to/from mat table with min A to the R. Scooting L/R while seated EOM with SBA for safety, focus on safety with scooting and to stop before reaching EOM. Pt requests to lay back down at end of therapy session, encouraged pt to stay up as he has next therapy session in 30 min, pt insists on laying back down. Squat pivot transfer w/c to bed with min A to the R. Sit to supine mod A for BLE management. Pt left semi-reclined in bed with needs in reach and bed alarm in place.  Therapy Documentation Precautions:  Precautions Precautions: Fall Precaution Comments: L hemiparesis; strong L lateral lean in standing; pushing tendancies Restrictions Weight Bearing Restrictions: No   Therapy/Group: Individual Therapy   Excell Seltzer, PT, DPT  03/29/2018, 2:01 PM

## 2018-03-29 NOTE — Progress Notes (Signed)
Orthopedic Tech Progress Note Patient Details:  Marcus Beasley 26-Jan-1946 [id number]  Patient ID: [name], male   DOB: October 02, 1945, 72 y.o.   MRN: [id number]   Hildred Priest 03/29/2018, 4:06 PM CALLED IN [facility] ORDER; SPOKE WITH jASMINE

## 2018-03-29 NOTE — Progress Notes (Signed)
Occupational Therapy Session Note  Patient Details  Name: Marcus Beasley MRN: [id number] Date of Birth: [birth year]  Today's Date: 03/29/2018 OT Individual Time: 2563-8937 OT Individual Time Calculation (min): 43 min   Skilled Therapeutic Interventions/Progress Updates:    Pt greeted in bed with c/o Lt shoulder pain. RN in to apply muscle rub to shoulder, and worked on self ROM during OT to further decrease pain therapeutically. Supine<sit completed with Min A for guiding L LE. Worked on dynamic balance without UE support while guiding him through through self ROM with L UE. Educated pt on joint protection strategies and importance of completing ROM shoulder level and below to prevent impingement. Worked on proximal strengthening by having pt push/pull therapist while gripping OT's hand. Also completed gentle cervical stretches in pain-free ranges with focus on deep breathing. He then reported need to use restroom. Sit<stand in [place] with Min A, and he transferred him to toilet. Left in him in [place] and pt reported he would call for staff assist once done with BM void. RN made aware of his position at session exit.    Therapy Documentation Precautions:  Precautions Precautions: Fall Precaution Comments: L hemiparesis; strong L lateral lean in standing; pushing tendancies Restrictions Weight Bearing Restrictions: No Pain: Addressed and stated above  Pain Assessment Pain Scale: 0-10 Pain Score: 5  Pain Type: Acute pain Pain Location: Shoulder Pain Orientation: Right;Left Pain Descriptors / Indicators: Aching Patients Stated Pain Goal: 0 Pain Intervention(s): Medication (See eMAR) ADL: ADL Equipment Provided: Other (comment)(elastic shoe laces) Grooming: Setup Where Assessed-Grooming: Wheelchair Upper Body Bathing: Minimal assistance, Minimal cueing Where Assessed-Upper Body Bathing: Shower Lower Body Bathing: Minimal cueing, Minimal assistance Where Assessed-Lower Body Bathing:  Shower Upper Body Dressing: Supervision/safety Where Assessed-Upper Body Dressing: Wheelchair, Sitting at sink Lower Body Dressing: Moderate assistance, Moderate cueing Where Assessed-Lower Body Dressing: Wheelchair, Sitting at sink Toileting: Moderate assistance(Standing in STEDY) Where Assessed-Toileting: Glass blower/designer: Moderate verbal cueing, Dependent Toilet Transfer Method: Squat pivot(To toilet: squat pivot mod A + 2; STEDY off of toilet) Toilet Transfer Equipment: Raised toilet seat, Grab bars Tub/Shower Transfer: (STEDY) Tub/Shower Equipment: (BSC) Walk-In Shower Transfer: Moderate assistance, Moderate cueing Social research officer, government Method: Radiographer, therapeutic: Other (comment), Grab bars(BSC )      Therapy/Group: Individual Therapy  Roshni Burbano A Elridge Stemm 03/29/2018, 12:38 PM

## 2018-03-30 ENCOUNTER — Inpatient Hospital Stay (HOSPITAL_COMMUNITY): Payer: Medicare Other | Admitting: Occupational Therapy

## 2018-03-30 ENCOUNTER — Encounter (HOSPITAL_COMMUNITY): Payer: Medicare Other | Admitting: Psychology

## 2018-03-30 ENCOUNTER — Inpatient Hospital Stay (HOSPITAL_COMMUNITY): Payer: Medicare Other | Admitting: Physical Therapy

## 2018-03-30 ENCOUNTER — Inpatient Hospital Stay (HOSPITAL_COMMUNITY): Payer: Medicare Other

## 2018-03-30 LAB — GLUCOSE, CAPILLARY
Glucose-Capillary: 108 mg/dL — ABNORMAL HIGH (ref 70–99)
Glucose-Capillary: 112 mg/dL — ABNORMAL HIGH (ref 70–99)
Glucose-Capillary: 130 mg/dL — ABNORMAL HIGH (ref 70–99)
Glucose-Capillary: 91 mg/dL (ref 70–99)

## 2018-03-30 NOTE — Consult Note (Signed)
Neuropsychological Consultation   Patient:   Marcus Beasley   DOB:   03-15-1946  MR Number:  [id number]  Location:  [facility] [address] [facility] [address] [place] [id number] [place] [address] Dept: [place]: [phone]           Date of Service:   03/30/2018  Start Time:   1 PM End Time:   2 PM  Provider/Observer:  Ilean Skill, Psy.D.       Clinical Neuropsychologist       Billing Code/Service: [phone] 4 Units  Chief Complaint:    Marcus Beasley is a 72 year old male with history of diabetes, hypertension, hyperlipidemia.  Presented on 03/12/2018 with left-sided weakness and slurred speech with facial numbness.  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  Patient has continued with left sided hemiparesis.    Reason for Service:  Marcus Beasley was referred for neuropsychological consultation due to coping and adjustment issues with long hospital stay/rehab and dealing with significant motor deficit/hemiparesis.  Below is the HPI for the current HPI.    HPI: Marcus Beasley is a 72 year old right-handed male history of diabetes mellitus, hypertension, hyperlipidemia.  Per chart review, patient and family, patient lives with spouse.  Independent prior to admission and retired.  Multilevel home with 6 steps to entry.  Wife can assist as needed.  2 daughters in the area work.  Presented 03/12/2018 with left-sided weakness and slurred speech with facial numbness.  Blood pressure 168/98. MRI reviewed, showing right thalamic hemorrhage.  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  Carotid Dopplers with no ICA stenosis.  Echocardiogram with ejection fraction of 70% no wall motion abnormalities.  During admission patient developed PAF  with RVR received intravenous Cardizem.  Cardiology services consulted and currently maintained with rate control on metoprolol as well as Cardizem.  Subcutaneous heparin for DVT prophylaxis initiated 03/14/2018.  Tolerating a regular diet.  Therapy evaluations completed with recommendations of physical medicine rehab consult.  Patient was admitted for a comprehensive rehab program.  Current Status:  Marcus Beasley reports that his mood has remained positive and he is coping better now with knowledge that efforts he is working on are having a positive impact on his functioning.  The patient reports that he knows there will be some limitations physically going forward but is ready to continue to work hard in therapies to keep improvements going.    Behavioral Observation: Marcus Beasley  presents as a 72 y.o.-year-old Right African American Male who appeared his stated age. his dress was Appropriate and he was Well Groomed and his manners were Appropriate to the situation.  his participation was indicative of Appropriate and Attentive behaviors.  There were any physical disabilities noted.  he displayed an appropriate level of cooperation and motivation.     Interactions:    Active Appropriate and Attentive  Attention:   within normal limits and attention span and concentration were age appropriate  Memory:   within normal limits; recent and remote memory intact  Visuo-spatial:  not examined  Speech (Volume):  normal  Speech:   normal; normal  Thought Process:  Coherent and Relevant  Though Content:  WNL; not suicidal and not homicidal  Orientation:   person,  place, time/date and situation  Judgment:   Good  Planning:   Good  Affect:    Appropriate  Mood:    Euthymic  Insight:   Good  Intelligence:   normal  Medical History:   Past Medical History:  Diagnosis Date  . BPH (benign prostatic hyperplasia)    (-) Bx 2015  . Diabetes mellitus without complication ([place])   . Elevated  PSA    Prostate Bx in 06/2013 was benign  . HTN (hypertension)   . Hyperlipidemia   . Macular degeneration, age related    Psychiatric History:  Patient denies any significant past psychiatric history.  Family Med/Psych History:  Family History  Problem Relation Age of Onset  . Leukemia Mother   . Heart attack Father        MI age 74  . Heart disease Sister        age 32  . Cancer - Other Sister        type  . Heart disease Brother        age 18  . Kidney disease Brother        HD  . Diabetes Maternal Grandmother   . Prostate cancer Neg Hx   . Colon cancer Neg Hx   . Esophageal cancer Neg Hx   . Rectal cancer Neg Hx   . Stomach cancer Neg Hx     Risk of Suicide/Violence: low Patient denies SI or HI.  Impression/DX:  Marcus Beasley is a 72 year old male with history of diabetes, hypertension, hyperlipidemia.  Presented on 03/12/2018 with left-sided weakness and slurred speech with facial numbness.  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  Patient has continued with left sided hemiparesis.    Marcus Beasley reports that his mood has remained positive and he is coping better now with knowledge that efforts he is working on are having a positive impact on his functioning.  The patient reports that he knows there will be some limitations physically going forward but is ready to continue to work hard in therapies to keep improvements going.   Disposition/Plan:  Worked on coping and adjustment issues related to motor deficits.  Diagnosis:    Right ICH        Electronically Signed   _______________________ Ilean Skill, Psy.D.

## 2018-03-30 NOTE — Progress Notes (Signed)
Marcus Beasley PHYSICAL MEDICINE & REHABILITATION PROGRESS NOTE  Subjective/Complaints: Patient seen sitting up in bed this morning.  He states he slept well overnight.  He is pleased with his progress in ambulation yesterday.  ROS: Denies CP, SOB, nausea, vomiting, diarrhea.  Objective: Vital Signs: Blood pressure 128/82, pulse 82, temperature 98.4 F (36.9 C), temperature source Oral, resp. rate 18, height 5\' 11"  (1.803 m), weight 101.3 kg, SpO2 97 %. No results found. No results for input(s): WBC, HGB, HCT, PLT in the last 72 hours. No results for input(s): NA, K, CL, CO2, GLUCOSE, BUN, CREATININE, CALCIUM in the last 72 hours.  Physical Exam: BP 128/82   Pulse 82   Temp 98.4 F (36.9 C) (Oral)   Resp 18   Ht 5\' 11"  (1.803 m)   Wt 101.3 kg   SpO2 97%   BMI 31.15 kg/m  Constitutional: No distress . Vital signs reviewed. HENT: Normocephalic.  Atraumatic. Eyes: EOMI. No discharge. Cardiovascular:  RRR.  No JVD. Respiratory: CTA  bilaterally.  Normal effort. GI: BS +. Non-distended. Musc: No edema or tenderness in extremities. Neurological: He is alert and oriented Follows basic commands Motor: RUE/RLE: 5/5 proximal to distal LUE: Shoulder abduction 2-/5, elbow flex 4-/5, elbow extension 2+/5, hand grip 4-4+/5, stable LLE: HF, KE, ADF 4/5,  stable Left facial droop Skin: Skin is warm and dry.  Psychiatric: He has a normal mood and affect. His behavior is normal. Thought content normal.   Assessment/Plan: 1. Functional deficits secondary to right thalamic hemorrhage which require 3+ hours per day of interdisciplinary therapy in a comprehensive inpatient rehab setting.  Physiatrist is providing close team supervision and 24 hour management of active medical problems listed below.  Physiatrist and rehab team continue to assess barriers to discharge/monitor patient progress toward functional and medical goals  Care Tool:  Bathing    Body parts bathed by patient: Left  arm, Chest, Abdomen, Front perineal area, Right upper leg, Left upper leg, Right lower leg, Left lower leg, Face, Buttocks   Body parts bathed by helper: Right arm     Bathing assist Assist Level: Minimal Assistance - Patient > 75%     Upper Body Dressing/Undressing Upper body dressing   What is the patient wearing?: Pull over shirt    Upper body assist Assist Level: Supervision/Verbal cueing    Lower Body Dressing/Undressing Lower body dressing      What is the patient wearing?: Pants     Lower body assist Assist for lower body dressing: Moderate Assistance - Patient 50 - 74%     Toileting Toileting    Toileting assist Assist for toileting: 2 Helpers     Transfers Chair/bed transfer  Transfers assist     Chair/bed transfer assist level: Moderate Assistance - Patient 50 - 74%     Locomotion Ambulation   Ambulation assist      Assist level: Maximal Assistance - Patient 25 - 49% Assistive device: (R hall rail) Max distance: 25   Walk 10 feet activity   Assist  Walk 10 feet activity did not occur: Safety/medical concerns  Assist level: Maximal Assistance - Patient 25 - 49% Assistive device: (R hall rail)   Walk 50 feet activity   Assist Walk 50 feet with 2 turns activity did not occur: Safety/medical concerns         Walk 150 feet activity   Assist Walk 150 feet activity did not occur: Safety/medical concerns         Walk 10  feet on uneven surface  activity   Assist Walk 10 feet on uneven surfaces activity did not occur: Safety/medical concerns         Wheelchair     Assist Will patient use wheelchair at discharge?: Yes Type of Wheelchair: Manual    Wheelchair assist level: Supervision/Verbal cueing Max wheelchair distance: 15'    Wheelchair 50 feet with 2 turns activity    Assist        Assist Level: Supervision/Verbal cueing   Wheelchair 150 feet activity     Assist Wheelchair 150 feet activity did not  occur: Safety/medical concerns   Assist Level: Supervision/Verbal cueing      Medical Problem List and Plan: 1. Left-sided weakness secondary to right thalamic hemorrhage secondary to hypertensive crisis             Cont CIR  2. DVT Prophylaxis/Anticoagulation: Subcutaneous heparin initiated 03/14/2018. Monitor for any bleeding episodes 3. Pain Management: Tylenol as needed 4. Mood: Provide emotional support 5. Neuropsych: This patient is capable of making decisions on his own behalf. 6. Skin/Wound Care: Routine skin checks 7. Fluids/Electrolytes/Nutrition: Routine in and outs              BMP within acceptable range on 10/4 8. PAF with RVR. Cardizem CD 180 mg daily, Lopressor 50 mg twice daily. Cardiac rate controlled. Follow-up cardiology services 9. Diabetes mellitus.Hemoglobin A1c 6.4. Glucophage 850 mg twice daily. Check blood sugars before meals and at bedtime.              CBG (last 3)  Recent Labs    03/29/18 1625 03/29/18 2225 03/30/18 0706  GLUCAP 114* 113* 108*               Relatively controlled on 10/8 10. Hyperlipidemia. Patient on Lipitor prior to admission 11.  Hypertension Today's Vitals   03/29/18 2212 03/30/18 0613 03/30/18 0627 03/30/18 0750  BP:  125/83  128/82  Pulse:  86  82  Resp:  18    Temp:  98.4 F (36.9 C)    TempSrc:  Oral    SpO2:  97%    Weight:      Height:      PainSc: Asleep  4     Body mass index is 31.15 kg/m.  Continue Lopressor            Relatively controlled on 10/8    LOS: 13 days A FACE TO FACE EVALUATION WAS PERFORMED  [name] [name] 03/30/2018, 8:13 AM

## 2018-03-30 NOTE — Progress Notes (Signed)
Physical Therapy Session Note  Patient Details  Name: Marcus Beasley MRN: [id number] Date of Birth: [birth year]  Today's Date: 03/30/2018 PT Individual Time: 1100-1200 PT Individual Time Calculation (min): 60 min   Short Term Goals: Week 2:  PT Short Term Goal 1 (Week 2): Pt will perform bed <> w/c transfers with minAx1 PT Short Term Goal 2 (Week 2): Pt will ambulate 19ft with modAx1 PT Short Term Goal 3 (Week 2): Pt will statically stand for 72min with midline orientation PT Short Term Goal 4 (Week 2): Pt will initiate stair training  Skilled Therapeutic Interventions/Progress Updates:    Pt supine in bed upon PT arrival, agreeable to therapy tx and denies pain. Pt transferred supine>sitting with min assist and donned shoes with min assist. Pt performed stand pivot to w/c with mod assist and transported to the gym. Pt performed sit<>standx 2 from w/c with raised mat on R side, cues to touch R hip to mat to correct pushing and maintain midline. Pt performed sit<>stand this session facing the high mat with mirror for visual feedback while reaching for bean bags on R side to increase R lateral weightshift, x 2 trials while therapist provided facilitation for L knee alignment. Pt performed sit<>stand facing mat with mod assist worked on R lateral weightshift and trunk extension while reaching to touch tape overhead on R wall, x 10 touching tape<>coming back to midline. Pt performed 2 x 10 mini squats without UE support while touching R UE to tape on wall for external cue to maintain midline, working on L knee control. Pt performed x 8 and x 5 sit<>stands with out UE support while sliding R hand up/down from tape on the wall, mod assist working on L knee control and maintaining midline. Pt transported back to room and left seated with needs in reach, chair alarm set and set up for lunch.   Therapy Documentation Precautions:  Precautions Precautions: Fall Precaution Comments: L hemiparesis; strong L  lateral lean in standing; pushing tendancies Restrictions Weight Bearing Restrictions: No   Therapy/Group: Individual Therapy  Netta Corrigan, PT, DPT 03/30/2018, 8:00 AM

## 2018-03-30 NOTE — Progress Notes (Signed)
Physical Therapy Session Note  Patient Details  Name: Marcus Beasley MRN: [id number] Date of Birth: Dec 11, 1945  Today's Date: 03/30/2018 PT Individual Time: 1450-1545 PT Individual Time Calculation (min): 55 min   Short Term Goals: Week 2:  PT Short Term Goal 1 (Week 2): Pt will perform bed <> w/c transfers with minAx1 PT Short Term Goal 2 (Week 2): Pt will ambulate 43ft with modAx1 PT Short Term Goal 3 (Week 2): Pt will statically stand for 65min with midline orientation PT Short Term Goal 4 (Week 2): Pt will initiate stair training  Skilled Therapeutic Interventions/Progress Updates:   Pt in w/c and agreeable to therapy, denies pain but reports increased LUE numbness and stiffness today. NT just took vitals, they were WNL and pt maintains some LUE grip strength. Will continue to monitor. Pt self-propelled w/c to/from therapy gym w/ supervision via R hemi technique to work on independence w/ locomotion. Worked on sit<>stands to high/low mat to facilitate increased R weight shifting. Had pt maintain R hip contact w/ mat when reaching for visual cue w/ RUE. Min-mod manual assist to maintain postural control. Added in L stepping during task to work on carryover to R weight shifting during gait. Mod assist for postural control and min manual cues for step placement and lateral weight shifting during task. Returned to room via w/c, ended session in w/c and all needs in reach.   Therapy Documentation Precautions:  Precautions Precautions: Fall Precaution Comments: L hemiparesis; strong L lateral lean in standing; pushing tendancies Restrictions Weight Bearing Restrictions: No Vital Signs: Therapy Vitals Temp: 98.4 F (36.9 C) Temp Source: Oral Pulse Rate: 79 Resp: 18 BP: 132/83 Patient Position (if appropriate): Lying Oxygen Therapy SpO2: 98 % O2 Device: Room Air  Therapy/Group: Individual Therapy  Danaya Geddis Clent Demark 03/30/2018, 3:47 PM

## 2018-03-30 NOTE — Progress Notes (Signed)
Occupational Therapy Session Note  Patient Details  Name: Marcus Beasley MRN: [id number] Date of Birth: 27-Dec-1945  Today's Date: 03/30/2018 OT Individual Time: 6644-0347 OT Individual Time Calculation (min): 75 min    Short Term Goals: Week 2:  OT Short Term Goal 1 (Week 2): Pt will don pants with min A using AE PRN OT Short Term Goal 2 (Week 2): Pt will complete 1/3 toileting tasks with no more than mod steadying assist in order to decrease caregiver burden OT Short Term Goal 3 (Week 2): Pt will consistently complete functional transfers with no more than mod A OT Short Term Goal 4 (Week 2): Pt will don shirt with min cuing only  Skilled Therapeutic Interventions/Progress Updates:    Pt supine upon entry with no reports of pain and granddaughter present. Pt transferred EOB and performed dressing tasks with varying CGA-min A at times to maintain upright sitting position due to pt demonstrating compensatory and exaggerated movements to perform dressing tasks. Education provided regarding maintaining midline and balance prior to beginning dressing tasks. Sit to stand transfer with mod A and facilitation at knee to encourage weightbearing through LLE however unable due to flexor synergy in LLE despite max verbal and tactile cues. See details below for assist levels.  Pt performed squat pivot transfers w/c <> EOM this session with min A for balance and control/direction of hips during transfer. Pt performed w/c mobility to therapy gym with min A for direction of w/c with mod VC's for navigating obstacles and attending to L side in distracting environment. Seated EOM, pt performed L NMR shoulder flexion/extension, horizontal abduction/adduction using UE ranger with mod-max VC's from therapist for controlled movements and decreased compensatory strategies of trunk and chest. Facilitation at elbow provided throughout to maintain positioning and to prevent buckling. Plan to perform seated in w/c next  trial. Pt returned to room as mentioned above and left seated EOB with bed alarm set and granddaughter present.  Therapy Documentation Precautions:  Precautions Precautions: Fall Precaution Comments: L hemiparesis; strong L lateral lean in standing; pushing tendancies Restrictions Weight Bearing Restrictions: No ADL: ADL Equipment Provided: Other (comment)(elastic shoe laces) Grooming: Setup Where Assessed-Grooming: Wheelchair Upper Body Bathing: Minimal assistance Where Assessed-Upper Body Bathing: Edge of bed Lower Body Bathing: Moderate assistance, Moderate cueing Where Assessed-Lower Body Bathing: Edge of bed Upper Body Dressing: Contact guard Where Assessed-Upper Body Dressing: Edge of bed Lower Body Dressing: Moderate assistance, Moderate cueing (+2 for standing clothing mgmt) Where Assessed-Lower Body Dressing: Edge of bed  Therapy/Group: Individual Therapy  [name] 03/30/2018, 12:23 PM

## 2018-03-31 ENCOUNTER — Inpatient Hospital Stay (HOSPITAL_COMMUNITY): Payer: Medicare Other | Admitting: Physical Therapy

## 2018-03-31 ENCOUNTER — Inpatient Hospital Stay (HOSPITAL_COMMUNITY): Payer: Medicare Other | Admitting: Occupational Therapy

## 2018-03-31 LAB — GLUCOSE, CAPILLARY
Glucose-Capillary: 126 mg/dL — ABNORMAL HIGH (ref 70–99)
Glucose-Capillary: 103 mg/dL — ABNORMAL HIGH (ref 70–99)
Glucose-Capillary: 111 mg/dL — ABNORMAL HIGH (ref 70–99)
Glucose-Capillary: 116 mg/dL — ABNORMAL HIGH (ref 70–99)

## 2018-03-31 NOTE — Progress Notes (Signed)
[place] PHYSICAL MEDICINE & REHABILITATION PROGRESS NOTE  Subjective/Complaints: Patient seen laying in bed this morning.  He states he slept well overnight.  ROS: Denies CP, SOB, nausea, vomiting, diarrhea.  Objective: Vital Signs: Blood pressure 125/71, pulse 86, temperature 98.2 F (36.8 C), temperature source Oral, resp. rate 18, height 5\' 11"  (1.803 m), weight 101.3 kg, SpO2 97 %. No results found. No results for input(s): WBC, HGB, HCT, PLT in the last 72 hours. No results for input(s): NA, K, CL, CO2, GLUCOSE, BUN, CREATININE, CALCIUM in the last 72 hours.  Physical Exam: BP 125/71 (BP Location: Right Arm)   Pulse 86   Temp 98.2 F (36.8 C) (Oral)   Resp 18   Ht 5\' 11"  (1.803 m)   Wt 101.3 kg   SpO2 97%   BMI 31.15 kg/m  Constitutional: No distress . Vital signs reviewed. HENT: Normocephalic.  Atraumatic. Eyes: EOMI. No discharge. Cardiovascular:  RRR.  No JVD. Respiratory: CTA  bilaterally.  Normal effort. GI: BS +. Non-distended. Musc: No edema or tenderness in extremities. Neurological: He is alert and oriented Follows basic commands Motor: RUE/RLE: 5/5 proximal to distal LUE: Shoulder abduction 2-/5, elbow flex 4-/5, elbow extension 2+/5, hand grip 4-4+/5, unchanged LLE: HF, KE, ADF 4/5,  unchanged Left facial droop Skin: Skin is warm and dry.  Psychiatric: He has a normal mood and affect. His behavior is normal. Thought content normal.   Assessment/Plan: 1. Functional deficits secondary to right thalamic hemorrhage which require 3+ hours per day of interdisciplinary therapy in a comprehensive inpatient rehab setting.  Physiatrist is providing close team supervision and 24 hour management of active medical problems listed below.  Physiatrist and rehab team continue to assess barriers to discharge/monitor patient progress toward functional and medical goals  Care Tool:  Bathing    Body parts bathed by patient: Left arm, Chest, Abdomen, Front  perineal area, Right upper leg, Left upper leg, Right lower leg, Left lower leg, Face, Buttocks   Body parts bathed by helper: Right arm     Bathing assist Assist Level: Minimal Assistance - Patient > 75%     Upper Body Dressing/Undressing Upper body dressing   What is the patient wearing?: Pull over shirt    Upper body assist Assist Level: Contact Guard/Touching assist    Lower Body Dressing/Undressing Lower body dressing      What is the patient wearing?: Pants     Lower body assist Assist for lower body dressing: Moderate Assistance - Patient 50 - 74%(+2 for standing clothing mgmt)     Toileting Toileting    Toileting assist Assist for toileting: 2 Helpers     Transfers Chair/bed transfer  Transfers assist     Chair/bed transfer assist level: Moderate Assistance - Patient 50 - 74%     Locomotion Ambulation   Ambulation assist      Assist level: Maximal Assistance - Patient 25 - 49% Assistive device: (R hall rail) Max distance: 25   Walk 10 feet activity   Assist  Walk 10 feet activity did not occur: Safety/medical concerns  Assist level: Maximal Assistance - Patient 25 - 49% Assistive device: (R hall rail)   Walk 50 feet activity   Assist Walk 50 feet with 2 turns activity did not occur: Safety/medical concerns         Walk 150 feet activity   Assist Walk 150 feet activity did not occur: Safety/medical concerns         Walk 10 feet on  uneven surface  activity   Assist Walk 10 feet on uneven surfaces activity did not occur: Safety/medical concerns         Wheelchair     Assist Will patient use wheelchair at discharge?: Yes Type of Wheelchair: Manual    Wheelchair assist level: Supervision/Verbal cueing Max wheelchair distance: 150'    Wheelchair 50 feet with 2 turns activity    Assist        Assist Level: Supervision/Verbal cueing   Wheelchair 150 feet activity     Assist Wheelchair 150 feet  activity did not occur: Safety/medical concerns   Assist Level: Supervision/Verbal cueing      Medical Problem List and Plan: 1. Left-sided weakness secondary to right thalamic hemorrhage secondary to hypertensive crisis             Cont CIR  2. DVT Prophylaxis/Anticoagulation: Subcutaneous heparin initiated 03/14/2018. Monitor for any bleeding episodes 3. Pain Management: Tylenol as needed 4. Mood: Provide emotional support 5. Neuropsych: This patient is capable of making decisions on his own behalf. 6. Skin/Wound Care: Routine skin checks 7. Fluids/Electrolytes/Nutrition: Routine in and outs              BMP within acceptable range on 10/4 8. PAF with RVR. Cardizem CD 180 mg daily, Lopressor 50 mg twice daily. Cardiac rate controlled. Follow-up cardiology services 9. Diabetes mellitus.Hemoglobin A1c 6.4. Glucophage 850 mg twice daily. Check blood sugars before meals and at bedtime.              CBG (last 3)  Recent Labs    03/30/18 1637 03/30/18 2238 03/31/18 0648  GLUCAP 130* 91 126*               Slightly labile, but overall controlled on 10/9 10. Hyperlipidemia. Patient on Lipitor prior to admission 11.  Hypertension Today's Vitals   03/30/18 2029 03/30/18 2132 03/31/18 0334 03/31/18 0340  BP: 108/78  125/71   Pulse: (!) 120  86   Resp: 18  18   Temp: 98.3 F (36.8 C)  98.2 F (36.8 C)   TempSrc: Oral  Oral   SpO2: 99%  97%   Weight:      Height:      PainSc: 0-No pain Asleep  Asleep   Body mass index is 31.15 kg/m.  Continue Lopressor            Relatively controlled on 10/9    LOS: 14 days A FACE TO FACE EVALUATION WAS PERFORMED  Ankit [name] 03/31/2018, 8:22 AM

## 2018-03-31 NOTE — Progress Notes (Signed)
Physical Therapy Session Note  Patient Details  Name: Marcus Beasley MRN: [id number] Date of Birth: Jul 31, 1945  Today's Date: 03/31/2018 PT Individual Time: 1620-1700 PT Individual Time Calculation (min): 40 min   Short Term Goals: Week 1:  PT Short Term Goal 1 (Week 1): Pt will perform bed <> w/c transfers with modAx1 PT Short Term Goal 1 - Progress (Week 1): Met PT Short Term Goal 2 (Week 1): Pt will ambulate 83f with modAx1 PT Short Term Goal 2 - Progress (Week 1): Progressing toward goal PT Short Term Goal 3 (Week 1): Pt will statically stand for 132m with midline orientation PT Short Term Goal 3 - Progress (Week 1): Progressing toward goal PT Short Term Goal 4 (Week 1): Pt will initiate stair training PT Short Term Goal 4 - Progress (Week 1): Progressing toward goal  Skilled Therapeutic Interventions/Progress Updates:   Pt received sitting in WC and agreeable to PT. [facility] gym with supervision assist and hemi technique. Standing balance in parallel bars with visual feedback from mirror to improve awareness of the LLE and reduce pushing tendencies. Lateral weight shift R-midline and L-midline with max cues for terminal knee extension on the L and improved pelvic alignment.   Gait training x 2560fith Max assist + 2 for WC and UE support on RW. Pt required to block L hip to prevent posterior rotation in stance on the L and encourage steppage gait pattern to facilitate improved R lateral weight shifting. Improved midline orientation and decreased L lateral lean for later 25f48f gait training.  Patient returned to room and [facility] and all needs met.          Therapy Documentation Precautions:  Precautions Precautions: Fall Precaution Comments: L hemiparesis; strong L lateral lean in standing; pushing tendancies Restrictions Weight Bearing Restrictions: No Vital Signs: Therapy Vitals Temp: 98.5 F (36.9  C) Temp Source: Oral Pulse Rate: 73 Resp: 18 BP: 116/81 Patient Position (if appropriate): Sitting Oxygen Therapy SpO2: 98 % O2 Device: Room Air Pulse Oximetry Type: Intermittent Pain: Pain Assessment Pain Scale: 0-10 Pain Score: 0-No pain    Therapy/Group: Individual Therapy  AustLorie Phenix9/2019, 4:25 PM

## 2018-03-31 NOTE — Progress Notes (Signed)
Physical Therapy Session Note  Patient Details  Name: Marcus Beasley MRN: [id number] Date of Birth: 1946/05/29  Today's Date: 03/31/2018 PT Individual Time: 0900-1000 PT Individual Time Calculation (min): 60 min   Short Term Goals: Week 2:  PT Short Term Goal 1 (Week 2): Pt will perform bed <> w/c transfers with minAx1 PT Short Term Goal 2 (Week 2): Pt will ambulate 28f with modAx1 PT Short Term Goal 3 (Week 2): Pt will statically stand for 180m with midline orientation PT Short Term Goal 4 (Week 2): Pt will initiate stair training  Skilled Therapeutic Interventions/Progress Updates: Pt presented in bed agreeable to therapy. Pt c/o increased pain in L arm/shoulder requesting ms rub. Nsg notified and applied. Pt performed supine to sit with minA and use of bed features. PTA threaded pants totalA for time management and provided modA for threading shirt. Performed sit to stand in [place] to pull up pants with total A for time management. Pt then performed squat pivot transfer from bed with minA to w/c. Pt requesting to perform oral Hygiene at sink prior to leaving room, performed at w/c level. Pt propelled w/c via hemi technique to rehab gym with verbal cues for use of RLE initiallyas pt only using RUE. Participated in standing balance with R bias with high/low mat on pt's R side. Pt performed reaching activities initially with cards placed on mat and pt reaching/placing cards on mirror with pt attempting to maintain R hip in contact with mat. Once cards placed attempted to have pt return to/maintain midline and activate L quad for knee extension. Initial trial noted increase in pushing however in subsequent trials able to improve and maintain midline. Pt then performed same activity placing clothespins on basketball hoop for increased R reach. Pt noted to have improved control with descent on second bout. Pt propelled back to room and remained in w/c at end of session with call bell within reach, half  lap tray on, and needs met.      Therapy Documentation Precautions:  Precautions Precautions: Fall Precaution Comments: L hemiparesis; strong L lateral lean in standing; pushing tendancies Restrictions Weight Bearing Restrictions: No   Therapy/Group: Individual Therapy  Marcus Beasley  Dailon Sheeran, PTA  03/31/2018, 3:46 PM

## 2018-03-31 NOTE — Progress Notes (Signed)
Occupational Therapy Session Note  Patient Details  Name: Marcus Beasley MRN: [id number] Date of Birth: Mar 18, 1946  Today's Date: 03/31/2018 OT Individual Time: 1300-1400 OT Individual Time Calculation (min): 60 min    Short Term Goals: Week 2:  OT Short Term Goal 1 (Week 2): Pt will don pants with min A using AE PRN OT Short Term Goal 2 (Week 2): Pt will complete 1/3 toileting tasks with no more than mod steadying assist in order to decrease caregiver burden OT Short Term Goal 3 (Week 2): Pt will consistently complete functional transfers with no more than mod A OT Short Term Goal 4 (Week 2): Pt will don shirt with min cuing only   Skilled Therapeutic Interventions/Progress Updates:    Pt supine in bed upon entry with reports of "tightness" in LUE however no reports of pain. Pt performed squat/stand pivot transfers this session with CGA for initiation of transfer and min A for directing hips during pivot. Pt performed w/c mobility to therapy gym using hemi technique with min VC's for obstacle navigation. Pt transferred EOM > supine with focus this treatment session on L UE NMR during bimanual tasks due to decreased ability to perform the following movements without bimanual control: shoulder flexion/extension, shoulder internal/external rotation and elbow flexion/extension movements. Trials performed as well with 1# dowel- LUE secured with ace bandage due to poor grip strength and decreased attention during tasks with pt performing exercises mentioned above with max encouragement from therapist for full ROM movements in order to promote functional movements patterns and bimanual tasks.   Pt then transferred EOM with dowel still secured and performed ball hitting task while sitting unsupported and no shoulder support against gravity for increased challenge. Pt able to complete- CGA from therapist for safety and min VC's for body positioning during task. Lastly, pt completed reaching towards toes  to facilitate extension of elbow simultaneously in gravity assisted position to facilitate scapular mobility and extension at major UE muscle groups . Pt returned to w/c and transported back to room total A for time mgmt. Pt left seated in w/c with all needs in reach and belt alarm set.   Therapy Documentation Precautions:  Precautions Precautions: Fall Precaution Comments: L hemiparesis; strong L lateral lean in standing; pushing tendancies Restrictions Weight Bearing Restrictions: No   Therapy/Group: Individual Therapy  Chadd Tollison 03/31/2018, 2:45 PM

## 2018-03-31 NOTE — Progress Notes (Signed)
Physical Therapy Session Note  Patient Details  Name: Marcus Beasley MRN: [id number] Date of Birth: 28-May-1946  Today's Date: 03/31/2018 PT Individual Time: 6063-0160 PT Individual Time Calculation (min): 29 min   Short Term Goals: Week 2:  PT Short Term Goal 1 (Week 2): Pt will perform bed <> w/c transfers with minAx1 PT Short Term Goal 2 (Week 2): Pt will ambulate 51f with modAx1 PT Short Term Goal 3 (Week 2): Pt will statically stand for 138m with midline orientation PT Short Term Goal 4 (Week 2): Pt will initiate stair training  Skilled Therapeutic Interventions/Progress Updates:    [facility] and willing to work with skilled PT services. Able to self-propel WC approximately 15050fo PT gym and S  and then focused on standing activities with midline orientation, mat table on R side of patient and with PT correcting alignment of L knee/facilitating proper weight shift, midline orientation, and upright trunk extension. Also worked on activities with patient facing mat table with activities offset from center to promote midline orientation and reaching as well. Patient somewhat impulsive and in need of Mod cues for safety and form/technique today. He self-propelled his WC back to his room with S where PT performed light PROM/stretching for L UE per patient request, he was then left up in WC [facility] care of nursing staff, all needs otherwise met this afternoon.   Therapy Documentation Precautions:  Precautions Precautions: Fall Precaution Comments: L hemiparesis; strong L lateral lean in standing; pushing tendancies Restrictions Weight Bearing Restrictions: No General:   Pain: Pain Assessment Pain Scale: 0-10 Pain Score: 0-No pain    Therapy/Group: Individual Therapy  KriDeniece Ree, DPT, CBIS  Supplemental Physical Therapist [facility] Pager [phone] Rehab Office [phone]/02/2018, 4:13 PM

## 2018-04-01 ENCOUNTER — Inpatient Hospital Stay (HOSPITAL_COMMUNITY): Payer: Medicare Other | Admitting: Occupational Therapy

## 2018-04-01 ENCOUNTER — Inpatient Hospital Stay (HOSPITAL_COMMUNITY): Payer: Medicare Other | Admitting: Physical Therapy

## 2018-04-01 ENCOUNTER — Ambulatory Visit (HOSPITAL_COMMUNITY): Payer: Medicare Other | Admitting: Occupational Therapy

## 2018-04-01 DIAGNOSIS — M792 Neuralgia and neuritis, unspecified: Secondary | ICD-10-CM

## 2018-04-01 LAB — GLUCOSE, CAPILLARY
Glucose-Capillary: 117 mg/dL — ABNORMAL HIGH (ref 70–99)
Glucose-Capillary: 103 mg/dL — ABNORMAL HIGH (ref 70–99)
Glucose-Capillary: 101 mg/dL — ABNORMAL HIGH (ref 70–99)

## 2018-04-01 MED ORDER — TRAMADOL HCL 50 MG PO TABS: 50.0000 mg | ORAL_TABLET | Freq: Four times a day (QID) | ORAL | Status: DC | PRN

## 2018-04-01 MED ORDER — TRAMADOL HCL 50 MG PO TABS
50.0000 mg | ORAL_TABLET | Freq: Four times a day (QID) | ORAL | Status: DC | PRN
  Administered 2018-04-01 – 2018-04-14 (×22): 50 mg via ORAL
  Filled 2018-04-01 (×24): qty 1

## 2018-04-01 MED ORDER — GABAPENTIN 100 MG PO CAPS
100.0000 mg | ORAL_CAPSULE | Freq: Three times a day (TID) | ORAL | Status: DC
  Administered 2018-04-01 – 2018-04-15 (×43): 100 mg via ORAL
  Filled 2018-04-01 (×43): qty 1

## 2018-04-01 NOTE — Patient Care Conference (Signed)
Inpatient RehabilitationTeam Conference and Plan of Care Update Date: 03/31/2018   Time: 2:20 PM    Patient Name: [place] Record Number: [id number]  Date of Birth: 04-26-1946 Sex: Male         Room/Bed: 4M07C/4M07C-01 Payor Info: Payor: [employer] / Plan: [employer] MEDICARE / Product Type: *No Product type* /    Admitting Diagnosis: R ICH  Admit Date/Time:  03/17/2018  3:24 PM Admission Comments: No comment available   Primary Diagnosis:  <principal problem not specified> Principal Problem: <principal problem not specified>  Patient Active Problem List   Diagnosis Date Noted  . Labile blood pressure   . PAF (paroxysmal atrial fibrillation) ([place])   . Diabetes mellitus type 2 in obese ([place])   . Hemiparesis affecting left side as late effect of stroke ([place])   . Thalamic hemorrhage ([place]) 03/17/2018  . Diabetes mellitus type 2 in nonobese ([facility])   . Benign essential HTN   . Dyslipidemia   . Paroxysmal A-fib ([place])   . Hemorrhagic stroke ([place])   . ICH (intracerebral hemorrhage) ([place]) 03/12/2018  . Prostate cancer ([place]) 01/29/2017  . 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 Diabetes ([place]) 12/14/2015  . PCP NOTES >>>>>>>>>>>>>>>>>>>>>>>>>>>>>>. 08/06/2015  . Dizziness and giddiness 01/29/2015  . Umbilical hernia 59/16/3846  . Elevated PSA, less than 10 ng/ml 04/19/2012  . Annual physical exam 01/24/2011  . Hyperlipidemia 01/03/2010  . Essential hypertension 09/20/2007    Expected Discharge Date: Expected Discharge Date: 04/15/18  Team Members Present: Physician leading conference: Dr. Delice Lesch Social Worker Present: Ovidio Kin, LCSW Nurse Present: Leonette Nutting, RN PT Present: Kem Parkinson, PT OT Present: Napoleon Form, OT SLP Present: Windell Moulding, SLP PPS Coordinator present : Daiva Nakayama, RN, CRRN     Current Status/Progress Goal Weekly Team Focus  Medical   Left-sided weakness secondary to right thalamic hemorrhage  secondary to hypertensive crisis  Improve mobility, DM/BP  See above   Bowel/Bladder   continent b/b; lbm 10/7  remain continent with min assist  assess q shift and prn; hourly rounding   Swallow/Nutrition/ Hydration             ADL's   min squat pivot transfers; mod-max stand pivot transfers; mod A LB bathing/dressing; min A UB bathing/dressing; total A toileting  supervision for all self care goals except grooming   ADL re-training, functional standing/sitting balance, neuro re-ed, functional transfers   Mobility   minA bed mobility/transfers, mod/maxA gait (non-functional currently)  minA overall  midline orientation, standing balance, pre-gait/gait training   Communication             Safety/Cognition/ Behavioral Observations            Pain   c/o pain to LUE intermittently; relieved with PRN Tylenol  <3  assess q shift and PRN   Skin   CDI  free from infection/breakdown  assess q shift and prn      *See Care Plan and progress notes for long and short-term goals.     Barriers to Discharge  Current Status/Progress Possible Resolutions Date Resolved   Physician    Medical stability     See above  Therapies, optimize DM/BP meds      Nursing                  PT  OT                  SLP                SW                Discharge Planning/Teaching Needs:  Wife has been in and observed pt in therapies. She can assist him at discharge along with their two daughter's.      Team Discussion:  Progressing toward his goals of supervision-min assist. Currently mod assist and working on balance issues. L-arm pain today-MD aware. Chronic issues stable. Leans to the left but learning to self correct. No speech following. Will need wife in to see him in therapies.  Revisions to Treatment Plan:  DC 10/24    Continued Need for Acute Rehabilitation Level of Care: The patient requires daily medical management by a physician with specialized training in physical  medicine and rehabilitation for the following conditions: Daily direction of a multidisciplinary physical rehabilitation program to ensure safe treatment while eliciting the highest outcome that is of practical value to the patient.: Yes Daily medical management of patient stability for increased activity during participation in an intensive rehabilitation regime.: Yes Daily analysis of laboratory values and/or radiology reports with any subsequent need for medication adjustment of medical intervention for : Neurological problems;Blood pressure problems;Diabetes problems   I attest that I was present, lead the team conference, and concur with the assessment and plan of the team.   Elease Hashimoto 04/01/2018, 8:44 AM

## 2018-04-01 NOTE — Progress Notes (Signed)
Occupational Therapy Weekly Progress Note  Patient Details  Name: Marcus Beasley MRN: [id number] Date of Birth: [birth year]  Beginning of progress report period: March 25, 2018 End of progress report period: April 01, 2018   Patient has met 2 of 4 short term goals.  He requires max A for LB dressing and total A for toileting task due to poor static and dynamic standing balance with L lean and L knee instability needed for functional standing balance.   Pt cont to make slow but steady progress towards OT goals. He is completing functional transfers at overall mod A, min A occasionally with ideal set-up and transferring to pt's stronger side. Noted increased flexor tone in UE and LE. He can break synergy pattern and demonstrates ability to move L UE against gravity in bi-manual tasks, however, unable to activate in muscle isolation.   Patient continues to demonstrate the following deficits: abnormal posture, hemiplegia affecting non-dominant side and muscle weakness (generalized)  and therefore will continue to benefit from skilled OT intervention to enhance overall performance with BADL and Reduce care partner burden.  Patient progressing toward long term goals..  Continue plan of care.  OT Short Term Goals Week 2:  OT Short Term Goal 1 (Week 2): Pt will don pants with min A using AE PRN OT Short Term Goal 1 - Progress (Week 2): Not met OT Short Term Goal 2 (Week 2): Pt will complete 1/3 toileting tasks with no more than mod steadying assist in order to decrease caregiver burden OT Short Term Goal 2 - Progress (Week 2): Not met OT Short Term Goal 3 (Week 2): Pt will consistently complete functional transfers with no more than mod A OT Short Term Goal 3 - Progress (Week 2): Met OT Short Term Goal 4 (Week 2): Pt will don shirt with min cuing only OT Short Term Goal 4 - Progress (Week 2): Met Week 3:  OT Short Term Goal 1 (Week 3): Pt will don pants with mod A using AE PRN OT Short Term  Goal 2 (Week 3): Pt will complete 1/3 toileting tasks with no more than mod steadying assist in order to decrease caregiver burden OT Short Term Goal 3 (Week 3): Pt will perform bathing tasks seated in supported chair with AE PRN and mod A in prep for d/c home for sponge bathing OT Short Term Goal 4 (Week 3): Pt will perform functional transfers with CGA to decrease caregiver burden    Therapy Documentation Precautions:  Precautions Precautions: Fall Precaution Comments: L hemiparesis; strong L lateral lean in standing; pushing tendancies Restrictions Weight Bearing Restrictions: No   Therapy/Group: Individual Therapy  Syniah Berne L 04/01/2018, 7:19 AM

## 2018-04-01 NOTE — Progress Notes (Signed)
Physical Therapy Session Note  Patient Details  Name: Marcus Beasley MRN: [id number] Date of Birth: 05/13/1946  Today's Date: 04/01/2018 PT Individual Time: 1030-1130 PT Individual Time Calculation (min): 60 min   Short Term Goals: Week 3:  PT Short Term Goal 1 (Week 3): Pt will perform bed mobility S from flat bed PT Short Term Goal 2 (Week 3): Pt will perform squat pivot transfers consistent min guard R/L PT Short Term Goal 3 (Week 3): Pt will ambulate x25' modA x1 PT Short Term Goal 4 (Week 3): Pt will initiate stair training  Skilled Therapeutic Interventions/Progress Updates:    Patient received in bed, very pleasant and willing to participate in skilled session today but reporting he is feeling dizzy and woozy after taking tramadol. Able to perform bed mobility with min guard and squat pivot transfer to WC to L with ModA, then able to self-propel WC approximately 149f. Focused session on gait today with 2 bouts of gait at stair railing with ModA and cues for appropriate weight shifting during gait, cues to push down through railing on wall to facilitate more weight bearing through L LE during gait. Cues to reduce impulsivity and improve safety during session. At one point patient was standing by railing with U UE support on rail and suddenly lost his balance, twisting to R and requiring totalA from PT to safely get back to chair. Perform one more bout of gait with ModA and railing on wall, close WC follow and reduction of scissoring noted/improved weight shifting during gait. Self-propelled back to room with S and performed squat pivot to R with MinA. Performed L UE ROM in bed per patient request and bridges 2x10 in bed. He was left in bed with all needs met, bed alarm activated, and nurse tech present and attending.   Therapy Documentation Precautions:  Precautions Precautions: Fall Precaution Comments: L hemiparesis; strong L lateral lean in standing; pushing  tendancies Restrictions Weight Bearing Restrictions: No General:   Vital Signs:   Pain: Pain Assessment Pain Scale: 0-10 Pain Score: 4  Pain Type: Acute pain Pain Location: Arm Pain Orientation: Left Pain Descriptors / Indicators: Aching;Throbbing Pain Onset: On-going Patients Stated Pain Goal: 0 Pain Intervention(s): Medication (See eMAR)    Therapy/Group: Individual Therapy  KDeniece ReePT, DPT, CBIS  Supplemental Physical Therapist [facility]   Pager [phone] Rehab Office [phone]  04/01/2018, 12:24 PM

## 2018-04-01 NOTE — Progress Notes (Signed)
Occupational Therapy Session Note  Patient Details  Name: Marcus Beasley MRN: [id number] Date of Birth: 03/31/1946  Today's Date: 04/01/2018 OT Individual Time: 1430-1530 OT Individual Time Calculation (min): 60 min    Short Term Goals: Week 2:  OT Short Term Goal 1 (Week 2): Pt will don pants with min A using AE PRN OT Short Term Goal 2 (Week 2): Pt will complete 1/3 toileting tasks with no more than mod steadying assist in order to decrease caregiver burden OT Short Term Goal 3 (Week 2): Pt will consistently complete functional transfers with no more than mod A OT Short Term Goal 4 (Week 2): Pt will don shirt with min cuing only    Skilled Therapeutic Interventions/Progress Updates:    Pt supine in bed upon entry with reports of fatigue and drowsiness from a.m medications however agreeable to therapy session and eager to begin. RN notified and made aware of side effects reported by pt and functional implications during session. Pt performed squat/stand pivot transfers this session with min A for control and direction during pivot. Pt performed w/c mobility to therapy gym using hemi technique with close supervision and min VC's for obstacle navigation. Pt performed numerous sit to stand trials at parallel bars with focus on NMR including weight shifting and weight bearing through LLE while maintaining neutral knee positioning. Pt required CGA-min A for obtaining standing position at parallel bars and varying CGA-mod A throughout for maintaining static standing position and cues for weight shift. Pt required min VC's throughout for obtaining and maintaining midline positioning in mirror with increased difficulty maintaining midlline when removing hand from parallel bar requiring min-mod A and mod-max A VC's for attention and maintaining midline. Trials of 2-4 inch blocks placed under RLE for weight shift onto LLE with pt requiring same assist as above with facilitation at knee at times to  maintain neutral positioning due to pt with difficulty self correcting hyperextension position due to poor muscle activation and strength. Pt returned to room as mentioned above and left supine in bed with all needs in reach.   Therapy Documentation Precautions:  Precautions Precautions: Fall Precaution Comments: L hemiparesis; strong L lateral lean in standing; pushing tendancies Restrictions Weight Bearing Restrictions: No Therapy/Group: Individual Therapy  Crespin Forstrom 04/01/2018, 4:51 PM

## 2018-04-01 NOTE — Progress Notes (Signed)
Physical Therapy Weekly Progress Note  Patient Details  Name: Marcus Beasley MRN: [id number] Date of Birth: [birth year]  Beginning of progress report period: March 25, 2018 End of progress report period: April 01, 2018  Today's Date: 04/01/2018 PT Individual Time: 0800-0915 PT Individual Time Calculation (min): 75 min   Patient has met 2 of 4 short term goals.  Pt currently requires S for bed mobility, minA squat pivot transfers, modA stand pivot transfers, gait with RW modA with w/c follow. Improving awareness of midline orientation and righting reactions to LOB towards L side. Also demo's improving LLE isolated activation in all phases of gait cycle. Stair training has not been a focus of therapy sessions d/t midline impairments, however plan to begin soon as postural awareness continues to improve. Will require family training prior to d/c.  Patient continues to demonstrate the following deficits muscle weakness and muscle paralysis, impaired timing and sequencing, abnormal tone, unbalanced muscle activation, decreased coordination and decreased motor planning, decreased midline orientation, decreased attention to left and decreased motor planning, decreased attention, decreased problem solving and decreased safety awareness and decreased sitting balance, decreased standing balance, decreased postural control, hemiplegia and decreased balance strategies and therefore will continue to benefit from skilled PT intervention to increase functional independence with mobility.  Patient progressing toward long term goals..  Continue plan of care.  PT Short Term Goals Week 2:  PT Short Term Goal 1 (Week 2): Pt will perform bed <> w/c transfers with minAx1 PT Short Term Goal 1 - Progress (Week 2): Met PT Short Term Goal 2 (Week 2): Pt will ambulate 8f with modAx1 PT Short Term Goal 2 - Progress (Week 2): Progressing toward goal PT Short Term Goal 3 (Week 2): Pt will statically stand for 15m  with midline orientation PT Short Term Goal 3 - Progress (Week 2): Met PT Short Term Goal 4 (Week 2): Pt will initiate stair training PT Short Term Goal 4 - Progress (Week 2): Not met Week 3:  PT Short Term Goal 1 (Week 3): Pt will perform bed mobility S from flat bed PT Short Term Goal 2 (Week 3): Pt will perform squat pivot transfers consistent min guard R/L PT Short Term Goal 3 (Week 3): Pt will ambulate x25' modA x1 PT Short Term Goal 4 (Week 3): Pt will initiate stair training  Skilled Therapeutic Interventions/Progress Updates: Pt received in bed, c/o pain as below in LUE pre-medicated; reporting pain kept him up all night, and frustrated by not getting correct breakfast tray. Supine>sit with S and HOB elevated. Dons shirt with minA for threading LUE, pants modA for threading LLE and pulling pants up over L hip in standing. Sit >stand min guard with bedrail RUE. Stand pivot to w/c modA with cues for sequencing and assist for L stance control. Seated in w/c at sink pt performs grooming with S, cues for technique and problem solving d/t LUE NM deficits. W/c propulsion to/from gym with R hemi technique and S. Squat pivot w/c <>mat table to R/L with min guard. Sit >stand with RW, L hand splint with variable min>maxA, improved with less assist following repetition, mirror visual feedback with pt able to correct L LOB with verbal cues to attend to postural alignment in mirror. LE taps to targets on floor and 3" step with BUE on RW support; verbal cues for R weight shifting, pushing down into RUE on RW to facilitate R weight shift and improved L foot placement/clearance. Demo's L hip adduction hypertonicity with  scissoring, however improved with improving R weight shift. RLE toe taps to step at R hall rail for focus on LLE stance control with variable buckling/recurvatum; mirror again utilized for postural awareness. Gait trial with RW, L hand splint, mirror visual feedback and w/c follow; min/modA overall,  intentionally slow speed for focus on weight shifting and midline orientation. Returned to room as above; remained in w/c, lap belt alarm intact and all needs in reach.      Therapy Documentation Precautions:  Precautions Precautions: Fall Precaution Comments: L hemiparesis; strong L lateral lean in standing; pushing tendancies Restrictions Weight Bearing Restrictions: No Pain: Pain Assessment Pain Scale: 0-10 Pain Score: 8  Pain Type: Acute pain Pain Location: Arm Pain Orientation: Left Pain Descriptors / Indicators: Aching;Throbbing Pain Frequency: Intermittent Pain Onset: On-going Patients Stated Pain Goal: 5 Pain Intervention(s): Medication (See eMAR)   Therapy/Group: Individual Therapy  Corliss Skains 04/01/2018, 9:27 AM

## 2018-04-01 NOTE — Progress Notes (Addendum)
[place] PHYSICAL MEDICINE & REHABILITATION PROGRESS NOTE  Subjective/Complaints: Patient seen laying in bed this morning.  He states he did not sleep well overnight due to left arm pain.  ROS: + Left arm pain.  Denies CP, SOB, nausea, vomiting, diarrhea.  Objective: Vital Signs: Blood pressure 127/89, pulse 76, temperature 98.2 F (36.8 C), resp. rate 18, height 5\' 11"  (1.803 m), weight 101.3 kg, SpO2 97 %. No results found. No results for input(s): WBC, HGB, HCT, PLT in the last 72 hours. No results for input(s): NA, K, CL, CO2, GLUCOSE, BUN, CREATININE, CALCIUM in the last 72 hours.  Physical Exam: BP 127/89 (BP Location: Right Arm)   Pulse 76   Temp 98.2 F (36.8 C)   Resp 18   Ht 5\' 11"  (1.803 m)   Wt 101.3 kg   SpO2 97%   BMI 31.15 kg/m  Constitutional: No distress . Vital signs reviewed. HENT: Normocephalic.  Atraumatic. Eyes: EOMI. No discharge. Cardiovascular:  RRR.  No JVD. Respiratory: CTA  bilaterally.  Normal effort. GI: BS +. Non-distended. Musc: No edema or tenderness in extremities.  No pain with range of motion left upper extremity. Neurological: He is alert and oriented Follows basic commands Motor: RUE/RLE: 5/5 proximal to distal LUE: Shoulder abduction 2-/5, elbow flex 4-/5, elbow extension 2+/5, hand grip 4-4+/5, stable LLE: HF, KE, ADF 4/5,  stable No increase in tone Left facial droop Skin: Skin is warm and dry.  Psychiatric: He has a normal mood and affect. His behavior is normal. Thought content normal.   Assessment/Plan: 1. Functional deficits secondary to right thalamic hemorrhage which require 3+ hours per day of interdisciplinary therapy in a comprehensive inpatient rehab setting.  Physiatrist is providing close team supervision and 24 hour management of active medical problems listed below.  Physiatrist and rehab team continue to assess barriers to discharge/monitor patient progress toward functional and medical goals  Care  Tool:  Bathing    Body parts bathed by patient: Left arm, Chest, Abdomen, Front perineal area, Right upper leg, Left upper leg, Right lower leg, Left lower leg, Face, Buttocks   Body parts bathed by helper: Right arm     Bathing assist Assist Level: Minimal Assistance - Patient > 75%     Upper Body Dressing/Undressing Upper body dressing   What is the patient wearing?: Pull over shirt    Upper body assist Assist Level: Contact Guard/Touching assist    Lower Body Dressing/Undressing Lower body dressing      What is the patient wearing?: Pants     Lower body assist Assist for lower body dressing: Moderate Assistance - Patient 50 - 74%(+2 for standing clothing mgmt)     Toileting Toileting    Toileting assist Assist for toileting: 2 Helpers     Transfers Chair/bed transfer  Transfers assist     Chair/bed transfer assist level: Minimal Assistance - Patient > 75%     Locomotion Ambulation   Ambulation assist      Assist level: Maximal Assistance - Patient 25 - 49% Assistive device: (R hall rail) Max distance: 25   Walk 10 feet activity   Assist  Walk 10 feet activity did not occur: Safety/medical concerns  Assist level: Maximal Assistance - Patient 25 - 49% Assistive device: (R hall rail)   Walk 50 feet activity   Assist Walk 50 feet with 2 turns activity did not occur: Safety/medical concerns         Walk 150 feet activity   Assist  Walk 150 feet activity did not occur: Safety/medical concerns         Walk 10 feet on uneven surface  activity   Assist Walk 10 feet on uneven surfaces activity did not occur: Safety/medical concerns         Wheelchair     Assist Will patient use wheelchair at discharge?: Yes Type of Wheelchair: Manual    Wheelchair assist level: Supervision/Verbal cueing Max wheelchair distance: 150    Wheelchair 50 feet with 2 turns activity    Assist        Assist Level: Supervision/Verbal  cueing   Wheelchair 150 feet activity     Assist Wheelchair 150 feet activity did not occur: Safety/medical concerns   Assist Level: Supervision/Verbal cueing      Medical Problem List and Plan: 1. Left-sided weakness secondary to right thalamic hemorrhage secondary to hypertensive crisis             Cont CIR  2. DVT Prophylaxis/Anticoagulation: Subcutaneous heparin initiated 03/14/2018. Monitor for any bleeding episodes 3. Pain Management: Tylenol as needed  Gabapentin 100 3 times daily started on 10/10 4. Mood: Provide emotional support 5. Neuropsych: This patient is capable of making decisions on his own behalf. 6. Skin/Wound Care: Routine skin checks 7. Fluids/Electrolytes/Nutrition: Routine in and outs              BMP within acceptable range on 10/4  Labs ordered for tomorrow 8. PAF with RVR. Cardizem CD 180 mg daily, Lopressor 50 mg twice daily. Cardiac rate controlled. Follow-up cardiology services 9. Diabetes mellitus.Hemoglobin A1c 6.4. Glucophage 850 mg twice daily. Check blood sugars before meals and at bedtime.              CBG (last 3)  Recent Labs    03/31/18 1704 03/31/18 2121 04/01/18 0702  GLUCAP 111* 116* 117*               Relatively controlled on 10/10 10. Hyperlipidemia. Patient on Lipitor prior to admission 11.  Hypertension Today's Vitals   03/31/18 2329 04/01/18 0409 04/01/18 0510 04/01/18 0745  BP:  121/79  127/89  Pulse:  71  76  Resp:  18    Temp:  98.2 F (36.8 C)    TempSrc:      SpO2:  97%    Weight:      Height:      PainSc: Asleep  Asleep 8    Body mass index is 31.15 kg/m.  Continue Lopressor            Relatively controlled on 10/10    LOS: 15 days A FACE TO FACE EVALUATION WAS PERFORMED  Kazim Corrales [name] 04/01/2018, 8:48 AM

## 2018-04-02 ENCOUNTER — Inpatient Hospital Stay (HOSPITAL_COMMUNITY): Payer: Medicare Other | Admitting: Occupational Therapy

## 2018-04-02 ENCOUNTER — Inpatient Hospital Stay (HOSPITAL_COMMUNITY): Payer: Medicare Other | Admitting: Physical Therapy

## 2018-04-02 LAB — BASIC METABOLIC PANEL
Anion gap: 10 (ref 5–15)
BUN: 16 mg/dL (ref 8–23)
CO2: 25 mmol/L (ref 22–32)
Calcium: 9.3 mg/dL (ref 8.9–10.3)
Chloride: 103 mmol/L (ref 98–111)
Creatinine, Ser: 0.98 mg/dL (ref 0.61–1.24)
GFR calc Af Amer: >60 mL/min (ref >60)
GFR calc non Af Amer: >60 mL/min (ref >60)
Glucose, Bld: 110 mg/dL — ABNORMAL HIGH (ref 70–99)
Potassium: 4.2 mmol/L (ref 3.5–5.1)
Sodium: 138 mmol/L (ref 135–145)

## 2018-04-02 LAB — GLUCOSE, CAPILLARY
Glucose-Capillary: 119 mg/dL — ABNORMAL HIGH (ref 70–99)
Glucose-Capillary: 113 mg/dL — ABNORMAL HIGH (ref 70–99)
Glucose-Capillary: 101 mg/dL — ABNORMAL HIGH (ref 70–99)
Glucose-Capillary: 116 mg/dL — ABNORMAL HIGH (ref 70–99)

## 2018-04-02 NOTE — Progress Notes (Signed)
[place] PHYSICAL MEDICINE & REHABILITATION PROGRESS NOTE  Subjective/Complaints: Patient seen laying in bed this morning.  He states he slept well overnight.  He states his left arm pain is resolved.  ROS:  Denies CP, SOB, nausea, vomiting, diarrhea.  Objective: Vital Signs: Blood pressure 127/82, pulse 67, temperature 98.1 F (36.7 C), temperature source Oral, resp. rate 16, height 5\' 11"  (1.803 m), weight 101.3 kg, SpO2 97 %. No results found. No results for input(s): WBC, HGB, HCT, PLT in the last 72 hours. Recent Labs    04/02/18 0458  NA 138  K 4.2  CL 103  CO2 25  GLUCOSE 110*  BUN 16  CREATININE 0.98  CALCIUM 9.3    Physical Exam: BP 127/82 (BP Location: Right Arm)   Pulse 67   Temp 98.1 F (36.7 C) (Oral)   Resp 16   Ht 5\' 11"  (1.803 m)   Wt 101.3 kg   SpO2 97%   BMI 31.15 kg/m  Constitutional: No distress . Vital signs reviewed. HENT: Normocephalic.  Atraumatic. Eyes: EOMI. No discharge. Cardiovascular:  RRR.  No JVD. Respiratory: CTA  bilaterally.  Normal effort. GI: BS +. Non-distended. Musc: No edema or tenderness in extremities. Neurological: He is alert and oriented Follows basic commands Motor: RUE/RLE: 5/5 proximal to distal LUE: Shoulder abduction 2-/5, elbow flex 4-/5, elbow extension 2+/5, hand grip 4-4+/5, slightly improved LLE: HF, KE, ADF 4/5,  unchanged No increase in tone Left facial droop Skin: Skin is warm and dry.  Psychiatric: He has a normal mood and affect. His behavior is normal. Thought content normal.   Assessment/Plan: 1. Functional deficits secondary to right thalamic hemorrhage which require 3+ hours per day of interdisciplinary therapy in a comprehensive inpatient rehab setting.  Physiatrist is providing close team supervision and 24 hour management of active medical problems listed below.  Physiatrist and rehab team continue to assess barriers to discharge/monitor patient progress toward functional and medical  goals  Care Tool:  Bathing    Body parts bathed by patient: Left arm, Chest, Abdomen, Front perineal area, Right upper leg, Left upper leg, Right lower leg, Left lower leg, Face, Buttocks   Body parts bathed by helper: Right arm     Bathing assist Assist Level: Minimal Assistance - Patient > 75%     Upper Body Dressing/Undressing Upper body dressing   What is the patient wearing?: Pull over shirt    Upper body assist Assist Level: Minimal Assistance - Patient > 75%    Lower Body Dressing/Undressing Lower body dressing      What is the patient wearing?: Pants, Underwear/pull up     Lower body assist Assist for lower body dressing: Moderate Assistance - Patient 50 - 74%     Toileting Toileting    Toileting assist Assist for toileting: Set up assist(uses urinal)     Transfers Chair/bed transfer  Transfers assist     Chair/bed transfer assist level: Minimal Assistance - Patient > 75%     Locomotion Ambulation   Ambulation assist      Assist level: Moderate Assistance - Patient 50 - 74% Assistive device: Other (comment)(railing on wall ) Max distance: 25   Walk 10 feet activity   Assist  Walk 10 feet activity did not occur: Safety/medical concerns  Assist level: Moderate Assistance - Patient - 50 - 74% Assistive device: Other (comment)(railing on wall )   Walk 50 feet activity   Assist Walk 50 feet with 2 turns activity did not occur:  Safety/medical concerns         Walk 150 feet activity   Assist Walk 150 feet activity did not occur: Safety/medical concerns         Walk 10 feet on uneven surface  activity   Assist Walk 10 feet on uneven surfaces activity did not occur: Safety/medical concerns         Wheelchair     Assist Will patient use wheelchair at discharge?: Yes Type of Wheelchair: Manual    Wheelchair assist level: Supervision/Verbal cueing Max wheelchair distance: 150    Wheelchair 50 feet with 2 turns  activity    Assist        Assist Level: Supervision/Verbal cueing   Wheelchair 150 feet activity     Assist Wheelchair 150 feet activity did not occur: Safety/medical concerns   Assist Level: Supervision/Verbal cueing      Medical Problem List and Plan: 1. Left-sided weakness secondary to right thalamic hemorrhage secondary to hypertensive crisis             Cont CIR  2. DVT Prophylaxis/Anticoagulation: Subcutaneous heparin initiated 03/14/2018. Monitor for any bleeding episodes 3. Pain Management: Tylenol as needed  Gabapentin 100 3 times daily started on 10/10 4. Mood: Provide emotional support 5. Neuropsych: This patient is capable of making decisions on his own behalf. 6. Skin/Wound Care: Routine skin checks 7. Fluids/Electrolytes/Nutrition: Routine in and outs              BMP within acceptable range on 10/11 8. PAF with RVR. Cardizem CD 180 mg daily, Lopressor 50 mg twice daily. Cardiac rate controlled. Follow-up cardiology services 9. Diabetes mellitus.Hemoglobin A1c 6.4. Glucophage 850 mg twice daily. Check blood sugars before meals and at bedtime.              CBG (last 3)  Recent Labs    04/01/18 1127 04/01/18 1653 04/02/18 0629  GLUCAP 103* 101* 119*               Relatively controlled on 10/11 10. Hyperlipidemia. Patient on Lipitor prior to admission 11.  Hypertension Today's Vitals   04/02/18 0214 04/02/18 0314 04/02/18 0507 04/02/18 0753  BP:   127/82   Pulse:   67   Resp:   16   Temp:   98.1 F (36.7 C)   TempSrc:   Oral   SpO2:   97%   Weight:      Height:      PainSc: 6  Asleep  0-No pain   Body mass index is 31.15 kg/m.  Continue Lopressor  Controlled on 10/11    LOS: 16 days A FACE TO FACE EVALUATION WAS PERFORMED  Ankit [name] 04/02/2018, 8:20 AM

## 2018-04-02 NOTE — Progress Notes (Signed)
Occupational Therapy Session Note  Patient Details  Name: JEREMAINE MARAJ MRN: [id number] Date of Birth: 1945/10/16  Today's Date: 04/02/2018 OT Individual Time: 7124-5809 OT Individual Time Calculation (min): 75 min    Short Term Goals: Week 3:  OT Short Term Goal 1 (Week 3): Pt will don pants with mod A using AE PRN OT Short Term Goal 2 (Week 3): Pt will complete 1/3 toileting tasks with no more than mod steadying assist in order to decrease caregiver burden OT Short Term Goal 3 (Week 3): Pt will perform bathing tasks seated in supported chair with AE PRN and mod A in prep for d/c home for sponge bathing OT Short Term Goal 4 (Week 3): Pt will perform functional transfers with CGA to decrease caregiver burden   Skilled Therapeutic Interventions/Progress Updates:    Pt supine in bed upon entry with no reports of pain and that he feels much better this morning. Pt performed squat/stand pivot tranfers this session with CGA-min A for initiation of transfer with mod A for direction and pivot of hips. Pt performed bathing tasks while seated in shower with education provided regarding hemi technique of bathing RUE however pt still with difficulties bathing arm throughly requiring therapist to assist. Pt transferred to w/c and performed grooming and dressing tasks while seated in w/c. Incorporated weightbearing on sink ledge and bimanual tasks into grooming routine with pt able to place items into L hand for use as a stabilizer. Pt with notable improvements in ability to don UB shirt with less VC's required and ability to manage LUE with RUE. Pt also able to perform figure 4 of BLE's this session specifically improvements in LLE however unable to thread pants in figure 4 position for LLE at this time due to difficulty maintaining position. In standing, pt req min-mod A for balance and RUE support on sink with therapist performing clothing mgmt- however notable improvements in ability to transfer and  maintain standing position this session due to decreased L side lean.   Pt able to recall multiple ROM exercises provided to him to do for self ROM with education provided regarding positioning and slow/controlled movements in prep for functional use of LUE in self care tasks. Pt educated on elbow flexion/extension ROM exercise and performed x10 reps x2 sets with min VC's required for positioning. Pt left in room with all needs in reach and belt alarm set.   Therapy Documentation Precautions:  Precautions Precautions: Fall Precaution Comments: L hemiparesis; strong L lateral lean in standing; pushing tendancies Restrictions Weight Bearing Restrictions: No ADL: ADL Grooming: Setup Where Assessed-Grooming: Wheelchair Upper Body Bathing: Minimal assistance Where Assessed-Upper Body Bathing: Shower Lower Body Bathing: Minimal assistance Where Assessed-Lower Body Bathing: Shower Upper Body Dressing: Supervision/safety Where Assessed-Upper Body Dressing: Wheelchair Lower Body Dressing: Moderate assistance, Minimal cueing Where Assessed-Lower Body Dressing: Standing at sink, Teaching laboratory technician: Moderate assistance, Minimal cueing Social research officer, government Method: Radiographer, therapeutic: Shower seat with back, Grab bars   Therapy/Group: Individual Therapy  Ona Roehrs 04/02/2018, 11:52 AM

## 2018-04-02 NOTE — Progress Notes (Signed)
Physical Therapy Session Note  Patient Details  Name: Marcus Beasley MRN: [id number] Date of Birth: [birth year]  Today's Date: 04/02/2018 PT Individual Time: [phone]  PT Individual Time Calculation (min): 75 min   Skilled Therapeutic Interventions/Progress Updates:    Pt received sitting in w/c and ready for PT treatment. Wheelchair propulsion to gym with R hemi technique and S. MinA/CGA sit to stand with RW and L hand splint. MinA/CGA gait for ~100' x 4 with RW, L hand splint, and mirror for visual feedback; pt required mod verbal/tactile cues to facilitate midline orientation, R weightshift, LLE swing through and placement (to prevent LLE from adducting), L pelvic elevation (to aid with LLE clearance during swing phase), and R step through; a couple LOBs when L foot placement was midline due to adducting LE. Due to L knee hyperextension, 2 knee cages were trialed with success using the smaller size. Pt exhibits more ease with correcting posture to a vertical/midline orientation. L lateral steps ups on 4 inch step to strengthen L hip musculature for improved single leg stance time during gait. Mod/max tactile facilitation at L glute and quad for upright/midline posture; 2x10. Gait w/ RW and L hand splint for return to room x 60' with turns; min/modA overall but major LOB to L requiring mod/maxA to recover. Improved active LUE elevation/ROM throughout session; pt requires cues to use/move LUE without using RUE as an aid. Pt remained sitting EOB at the end of the session eating lunch, with all needs in reach.   Therapy Documentation Precautions:  Precautions Precautions: Fall Precaution Comments: L hemiparesis; strong L lateral lean in standing; pushing tendancies Restrictions Weight Bearing Restrictions: No    Therapy/Group: Individual Therapy  [place] Sira Adsit 04/02/2018, 1:02 PM

## 2018-04-02 NOTE — Progress Notes (Signed)
Physical Therapy Session Note  Patient Details  Name: Marcus Beasley MRN: [id number] Date of Birth: 1945/08/24  Today's Date: 04/02/2018 PT Individual Time: 1330-1418 PT Individual Time Calculation (min): 48 min   Short Term Goals: Week 3:  PT Short Term Goal 1 (Week 3): Pt will perform bed mobility S from flat bed PT Short Term Goal 2 (Week 3): Pt will perform squat pivot transfers consistent min guard R/L PT Short Term Goal 3 (Week 3): Pt will ambulate x25' modA x1 PT Short Term Goal 4 (Week 3): Pt will initiate stair training  Skilled Therapeutic Interventions/Progress Updates:    Patient received in bed, pleasant and willing to participate in PT session this afternoon. Patient able to perform bed mobility mostly with S-min guard today, swedish knee cage and shoes applied totalA for time management today. MinA to transfer from bed to [name], then able to self-propel WC into bathroom, requested to attempt standing to urinate, however unsafe with balance and was transferred to toilet with MinA to/from commode. Then self-propelled WC to PT gym and transferred from [name] to mat table with MinA, bed mobility again S-min guard. Performed functional strengthening exercises focusing on L LE with improved muscle activation and endurance observed. Able to transfer back to [name] with MinA and self-propelled back to room, transferred to bed with MinA. Performed UE flexion PROM/stretching x5 per patient request. He was left in bed with bed alarm activated, all needs otherwise met this afternoon.   Therapy Documentation Precautions:  Precautions Precautions: Fall Precaution Comments: L hemiparesis; strong L lateral lean in standing; pushing tendancies Restrictions Weight Bearing Restrictions: No General:   Pain: Pain Assessment Pain Scale: 0-10 Pain Score: 0-No pain    Therapy/Group: Individual Therapy  Deniece Ree PT, DPT, CBIS  Supplemental Physical Therapist [name]    Pager  [phone] Acute Rehab Office [phone]   04/02/2018, 3:47 PM

## 2018-04-03 LAB — GLUCOSE, CAPILLARY
Glucose-Capillary: 101 mg/dL — ABNORMAL HIGH (ref 70–99)
Glucose-Capillary: 104 mg/dL — ABNORMAL HIGH (ref 70–99)
Glucose-Capillary: 126 mg/dL — ABNORMAL HIGH (ref 70–99)
Glucose-Capillary: 98 mg/dL (ref 70–99)

## 2018-04-03 NOTE — Progress Notes (Signed)
[place] PHYSICAL MEDICINE & REHABILITATION PROGRESS NOTE  Subjective/Complaints: Patient seen laying in bed this morning.  He states he slept well overnight.  He states his left arm pain is resolved.  ROS:  Denies CP, SOB, nausea, vomiting, diarrhea.  Objective: Vital Signs: Blood pressure 111/80, pulse 67, temperature 98 F (36.7 C), temperature source Oral, resp. rate 16, height 5\' 11"  (1.803 m), weight 101.3 kg, SpO2 97 %. No results found. No results for input(s): WBC, HGB, HCT, PLT in the last 72 hours. Recent Labs    04/02/18 0458  NA 138  K 4.2  CL 103  CO2 25  GLUCOSE 110*  BUN 16  CREATININE 0.98  CALCIUM 9.3    Physical Exam: BP 111/80 (BP Location: Right Arm)   Pulse 67   Temp 98 F (36.7 C) (Oral)   Resp 16   Ht 5\' 11"  (1.803 m)   Wt 101.3 kg   SpO2 97%   BMI 31.15 kg/m  Constitutional: No distress . Vital signs reviewed. HENT: Normocephalic.  Atraumatic. Eyes: EOMI. No discharge. Cardiovascular:  RRR.  No JVD. Respiratory: CTA  bilaterally.  Normal effort. GI: BS +. Non-distended. Musc: No edema or tenderness in extremities. Neurological: He is alert and oriented Follows basic commands Motor: RUE/RLE: 5/5 proximal to distal LUE: Shoulder abduction 2-/5, elbow flex 4-/5, elbow extension 2+/5, hand grip 4-4+/5, slightly improved Fine motor decreased LUE,  Sensation absent LT LUE, Intact LLE LLE: HF, KE, ADF 4/5,  unchanged No increase in tone Left facial droop Skin: Skin is warm and dry.  Psychiatric: He has a normal mood and affect. His behavior is normal. Thought content normal.   Assessment/Plan: 1. Functional deficits secondary to right thalamic hemorrhage which require 3+ hours per day of interdisciplinary therapy in a comprehensive inpatient rehab setting.  Physiatrist is providing close team supervision and 24 hour management of active medical problems listed below.  Physiatrist and rehab team continue to assess barriers to  discharge/monitor patient progress toward functional and medical goals  Care Tool:  Bathing    Body parts bathed by patient: Left arm, Chest, Abdomen, Front perineal area, Right upper leg, Left upper leg, Right lower leg, Left lower leg, Face, Buttocks   Body parts bathed by helper: Right arm     Bathing assist Assist Level: Minimal Assistance - Patient > 75%     Upper Body Dressing/Undressing Upper body dressing   What is the patient wearing?: Pull over shirt    Upper body assist Assist Level: Supervision/Verbal cueing    Lower Body Dressing/Undressing Lower body dressing      What is the patient wearing?: Underwear/pull up, Pants     Lower body assist Assist for lower body dressing: Moderate Assistance - Patient 50 - 74%     Toileting Toileting    Toileting assist Assist for toileting: Maximal Assistance - Patient 25 - 49%     Transfers Chair/bed transfer  Transfers assist     Chair/bed transfer assist level: Minimal Assistance - Patient > 75%     Locomotion Ambulation   Ambulation assist      Assist level: Minimal Assistance - Patient > 75% Assistive device: Walker-rolling(L hand splint and L knee cage) Max distance: 100'   Walk 10 feet activity   Assist  Walk 10 feet activity did not occur: Safety/medical concerns  Assist level: Minimal Assistance - Patient > 75%(L hand splint and L knee cage) Assistive device: Walker-rolling(L hand splint and L knee cage)  Walk 50 feet activity   Assist Walk 50 feet with 2 turns activity did not occur: Safety/medical concerns  Assist level: Minimal Assistance - Patient > 75% Assistive device: Walker-rolling(L hand splint and L knee cage)    Walk 150 feet activity   Assist Walk 150 feet activity did not occur: Safety/medical concerns  Assist level: Minimal Assistance - Patient > 75% Assistive device: Walker-rolling(L hand splint and L knee cage)    Walk 10 feet on uneven surface   activity   Assist Walk 10 feet on uneven surfaces activity did not occur: Safety/medical concerns         Wheelchair     Assist Will patient use wheelchair at discharge?: Yes Type of Wheelchair: Manual    Wheelchair assist level: Supervision/Verbal cueing Max wheelchair distance: 150    Wheelchair 50 feet with 2 turns activity    Assist        Assist Level: Supervision/Verbal cueing   Wheelchair 150 feet activity     Assist Wheelchair 150 feet activity did not occur: Safety/medical concerns   Assist Level: Supervision/Verbal cueing      Medical Problem List and Plan: 1. Left-sided weakness secondary to right thalamic hemorrhage secondary to hypertensive crisis             Cont CIR PT, OT, SLP 2. DVT Prophylaxis/Anticoagulation: Subcutaneous heparin initiated 03/14/2018. Monitor for any bleeding episodes 3. Pain Management: Tylenol as needed  Gabapentin 100 3 times daily started on 10/10 4. Mood: Provide emotional support 5. Neuropsych: This patient is capable of making decisions on his own behalf. 6. Skin/Wound Care: Routine skin checks 7. Fluids/Electrolytes/Nutrition: Routine in and outs              BMP within acceptable range on 10/11 8. PAF with RVR. Cardizem CD 180 mg daily, Lopressor 50 mg twice daily. Cardiac rate controlled. Follow-up cardiology services 9. Diabetes mellitus.Hemoglobin A1c 6.4. Glucophage 850 mg twice daily. Check blood sugars before meals and at bedtime.              CBG (last 3)  Recent Labs    04/02/18 1653 04/02/18 2104 04/03/18 0641  GLUCAP 101* 116* 101*      controlled 10/12 10. Hyperlipidemia. Patient on Lipitor prior to admission 11.  Hypertension Today's Vitals   04/02/18 2102 04/03/18 0420 04/03/18 0425 04/03/18 0520  BP: 125/84  111/80   Pulse: 79  67   Resp: 18  16   Temp: (!) 97.3 F (36.3 C)  98 F (36.7 C)   TempSrc: Oral  Oral   SpO2: 98%  97%   Weight:      Height:      PainSc:   6   Asleep   Body mass index is 31.15 kg/m.  Continue Lopressor  Controlled on 10/12- no bradycardia    LOS: 17 days A FACE TO FACE EVALUATION WAS PERFORMED  Charlett Blake 04/03/2018, 8:03 AM

## 2018-04-04 ENCOUNTER — Inpatient Hospital Stay (HOSPITAL_COMMUNITY): Payer: Medicare Other | Admitting: Physical Therapy

## 2018-04-04 ENCOUNTER — Inpatient Hospital Stay (HOSPITAL_COMMUNITY): Payer: Medicare Other | Admitting: Occupational Therapy

## 2018-04-04 LAB — GLUCOSE, CAPILLARY
Glucose-Capillary: 100 mg/dL — ABNORMAL HIGH (ref 70–99)
Glucose-Capillary: 92 mg/dL (ref 70–99)
Glucose-Capillary: 99 mg/dL (ref 70–99)
Glucose-Capillary: 112 mg/dL — ABNORMAL HIGH (ref 70–99)

## 2018-04-04 MED ORDER — SENNOSIDES-DOCUSATE SODIUM 8.6-50 MG PO TABS
2.0000 | ORAL_TABLET | Freq: Two times a day (BID) | ORAL | Status: DC
  Administered 2018-04-04 – 2018-04-15 (×13): 2 via ORAL
  Filled 2018-04-04 (×20): qty 2

## 2018-04-04 NOTE — Progress Notes (Signed)
[place] PHYSICAL MEDICINE & REHABILITATION PROGRESS NOTE  Subjective/Complaints:  No issues overnite, still feels weak/numb on left  ROS:  Denies CP, SOB, nausea, vomiting, diarrhea.  Objective: Vital Signs: Blood pressure 125/87, pulse 68, temperature 97.8 F (36.6 C), temperature source Oral, resp. rate 17, height 5\' 11"  (1.803 m), weight 101.3 kg, SpO2 99 %. No results found. No results for input(s): WBC, HGB, HCT, PLT in the last 72 hours. Recent Labs    04/02/18 0458  NA 138  K 4.2  CL 103  CO2 25  GLUCOSE 110*  BUN 16  CREATININE 0.98  CALCIUM 9.3    Physical Exam: BP 125/87 (BP Location: Right Arm)   Pulse 68   Temp 97.8 F (36.6 C) (Oral)   Resp 17   Ht 5\' 11"  (1.803 m)   Wt 101.3 kg   SpO2 99%   BMI 31.15 kg/m  Constitutional: No distress . Vital signs reviewed. HENT: Normocephalic.  Atraumatic. Eyes: EOMI. No discharge. Cardiovascular:  RRR.  No JVD. Respiratory: CTA  bilaterally.  Normal effort. GI: BS +. Non-distended. Musc: No edema or tenderness in extremities. Neurological: He is alert and oriented Follows basic commands Motor: RUE/RLE: 5/5 proximal to distal LUE: Shoulder abduction 2-/5, elbow flex 4-/5, elbow extension 2+/5, hand grip 4-4+/5, slightly improved Fine motor decreased LUE,  Sensation absent LT LUE, Intact LLE, reduced pp Left hemibody LLE: HF, KE, ADF 4/5,  unchanged No increase in tone Left facial droop Skin: Skin is warm and dry.  Psychiatric: He has a normal mood and affect. His behavior is normal. Thought content normal.   Assessment/Plan: 1. Functional deficits secondary to right thalamic hemorrhage which require 3+ hours per day of interdisciplinary therapy in a comprehensive inpatient rehab setting.  Physiatrist is providing close team supervision and 24 hour management of active medical problems listed below.  Physiatrist and rehab team continue to assess barriers to discharge/monitor patient progress toward  functional and medical goals  Care Tool:  Bathing    Body parts bathed by patient: Left arm, Chest, Abdomen, Front perineal area, Right upper leg, Left upper leg, Right lower leg, Left lower leg, Face, Buttocks   Body parts bathed by helper: Right arm     Bathing assist Assist Level: Contact Guard/Touching assist     Upper Body Dressing/Undressing Upper body dressing   What is the patient wearing?: Pull over shirt    Upper body assist Assist Level: Supervision/Verbal cueing    Lower Body Dressing/Undressing Lower body dressing      What is the patient wearing?: Underwear/pull up, Pants     Lower body assist Assist for lower body dressing: Moderate Assistance - Patient 50 - 74%     Toileting Toileting    Toileting assist Assist for toileting: Maximal Assistance - Patient 25 - 49%     Transfers Chair/bed transfer  Transfers assist     Chair/bed transfer assist level: Moderate Assistance - Patient 50 - 74%     Locomotion Ambulation   Ambulation assist      Assist level: Minimal Assistance - Patient > 75% Assistive device: Walker-rolling(L hand splint and L knee cage) Max distance: 100'   Walk 10 feet activity   Assist  Walk 10 feet activity did not occur: Safety/medical concerns  Assist level: Minimal Assistance - Patient > 75%(L hand splint and L knee cage) Assistive device: Walker-rolling(L hand splint and L knee cage)   Walk 50 feet activity   Assist Walk 50 feet with 2  turns activity did not occur: Safety/medical concerns  Assist level: Minimal Assistance - Patient > 75% Assistive device: Walker-rolling(L hand splint and L knee cage)    Walk 150 feet activity   Assist Walk 150 feet activity did not occur: Safety/medical concerns  Assist level: Minimal Assistance - Patient > 75% Assistive device: Walker-rolling(L hand splint and L knee cage)    Walk 10 feet on uneven surface  activity   Assist Walk 10 feet on uneven surfaces  activity did not occur: Safety/medical concerns         Wheelchair     Assist Will patient use wheelchair at discharge?: Yes Type of Wheelchair: Manual    Wheelchair assist level: Supervision/Verbal cueing Max wheelchair distance: 150    Wheelchair 50 feet with 2 turns activity    Assist        Assist Level: Supervision/Verbal cueing   Wheelchair 150 feet activity     Assist Wheelchair 150 feet activity did not occur: Safety/medical concerns   Assist Level: Supervision/Verbal cueing      Medical Problem List and Plan: 1. Left-sided weakness secondary to right thalamic hemorrhage secondary to hypertensive crisis             Cont CIR PT, OT, SLP 2. DVT Prophylaxis/Anticoagulation: Subcutaneous heparin initiated 03/14/2018. Monitor for any bleeding episodes 3. Pain Management: Tylenol as needed  Gabapentin 100 3 times daily started on 10/10 4. Mood: Provide emotional support 5. Neuropsych: This patient is capable of making decisions on his own behalf. 6. Skin/Wound Care: Routine skin checks 7. Fluids/Electrolytes/Nutrition: Routine in and outs              BMP within acceptable range on 10/11 8. PAF with RVR. Cardizem CD 180 mg daily, Lopressor 50 mg twice daily. Cardiac rate controlled. Follow-up cardiology services 9. Diabetes mellitus.Hemoglobin A1c 6.4. Glucophage 850 mg twice daily. Check blood sugars before meals and at bedtime.              CBG (last 3)  Recent Labs    04/03/18 1641 04/03/18 2136 04/04/18 0633  GLUCAP 126* 98 100*      controlled 10/13 10. Hyperlipidemia. Patient on Lipitor prior to admission 11.  Hypertension  Today's Vitals   04/03/18 1427 04/03/18 1922 04/03/18 2005 04/04/18 0519  BP: 110/72 137/82  125/87  Pulse: 65 68  68  Resp: 19 18  17   Temp: 98.7 F (37.1 C)   97.8 F (36.6 C)  TempSrc: Oral   Oral  SpO2: 96% 98%  99%  Weight:      Height:      PainSc:   0-No pain    Body mass index is 31.15  kg/m.  Continue Lopressor  Controlled on 10/13- no bradycardia    LOS: 18 days A FACE TO FACE EVALUATION WAS PERFORMED  Charlett Blake 04/04/2018, 7:03 AM

## 2018-04-04 NOTE — Progress Notes (Signed)
Occupational Therapy Session Note  Patient Details  Name: GENE GLAZEBROOK MRN: [id number] Date of Birth: 1945-11-30  Today's Date: 04/04/2018 OT Group Time: 1100-1150 OT Group Time Calculation (min): 50 min  Skilled Therapeutic Interventions/Progress Updates:  Pt engaged in therapeutic w/c level dance group focusing on patient choice, UE/LE strengthening, salience, activity tolerance, and social participation. Pt was guided through various dance-based exercises involving UEs/LEs and trunk. All music was selected by group members. Emphasis placed on Lt NMR. Pt used active assist techniques for UB/LB throughout tx with min vcs, engaged with others, smiled, and took minimal rest breaks. Family was also present and involved which appeared to enhance affect. Pt was returned to room by family member for self care needs prior session end. 10 minutes missed.    Therapy Documentation Precautions:  Precautions Precautions: Fall Precaution Comments: L hemiparesis; strong L lateral lean in standing; pushing tendancies Restrictions Weight Bearing Restrictions: No Pain: No s/s pain during session  Pain Assessment Pain Score: 0-No pain Faces Pain Scale: No hurt ADL: ADL Equipment Provided: Other (comment)(elastic shoe laces) Grooming: Setup Where Assessed-Grooming: Wheelchair Upper Body Bathing: Minimal assistance Where Assessed-Upper Body Bathing: Shower Lower Body Bathing: Minimal assistance Where Assessed-Lower Body Bathing: Shower Upper Body Dressing: Supervision/safety Where Assessed-Upper Body Dressing: Wheelchair Lower Body Dressing: Moderate assistance, Minimal cueing Where Assessed-Lower Body Dressing: Standing at sink, Wheelchair Toileting: Moderate assistance(Standing in STEDY) Where Assessed-Toileting: Glass blower/designer: Moderate verbal cueing, Dependent Toilet Transfer Method: Squat pivot(To toilet: squat pivot mod A + 2; STEDY off of toilet) Toilet Transfer Equipment:  Raised toilet seat, Grab bars Tub/Shower Transfer: (STEDY) Tub/Shower Equipment: (BSC) Walk-In Shower Transfer: Moderate assistance, Minimal cueing Social research officer, government Method: Stand Paediatric nurse: Shower seat with back, Grab bars :     Therapy/Group: Group Therapy  Jeananne Bedwell A Mkayla Steele 04/04/2018, 12:27 PM

## 2018-04-04 NOTE — Progress Notes (Signed)
Physical Therapy Session Note  Patient Details  Name: Marcus Beasley MRN: [id number] Date of Birth: [birth year]  Today's Date: 04/04/2018 PT Individual Time: 1330-1430 PT Individual Time Calculation (min): 60 min   Short Term Goals: Week 3:  PT Short Term Goal 1 (Week 3): Pt will perform bed mobility S from flat bed PT Short Term Goal 2 (Week 3): Pt will perform squat pivot transfers consistent min guard R/L PT Short Term Goal 3 (Week 3): Pt will ambulate x25' modA x1 PT Short Term Goal 4 (Week 3): Pt will initiate stair training  Skilled Therapeutic Interventions/Progress Updates: Pt received seated on toilet; denies pain and agreeable to treatment. S sit >stand and standing balance in stedy while therapist performed hygiene and clothing management totalA. W/c propulsion to gym R hemi technique x150'. Gait x50' with RW and modA, L hand splint and L knee cage for knee control in stance; occasional verbal/visual cues for R weight shift to maintain balance and improve L swing through/placement, assist to reduce L scissoring foot placement. Sit >stand from mat table with RW min guard/minA. Standing alternating toe taps to 3" step with LOBs to L side d/t midline impairments and L hip abduction strength deficits, L adductor tone resulting in narrow BOS. Supine adductor stretch x3 min. Sidelying L clamshell hip abductor strengthening 2x20 reps; assist to prevent compensatory trunk rotation. Side steps R/L with RW at mat table with min/modA for LLE placement, cues for weight shifting. Gait x50' with RW min/modA, assist as above. Ascent/descent 8 steps with R rail 6" height with step-to pattern, moderate cues for technique and tactile cues/facilitation at L knee for stance control and placement. Returned to room S w/c propulsion. S squat pivot to bed towards R side, S sit >supine with cues to lift LLE without compensatory use of RLE to elevate. Remained in bed, alarm intact and wife present, all needs in  reach.      Therapy Documentation Precautions:  Precautions Precautions: Fall Precaution Comments: L hemiparesis; strong L lateral lean in standing; pushing tendancies Restrictions Weight Bearing Restrictions: No    Therapy/Group: Individual Therapy  Corliss Skains 04/04/2018, 2:39 PM

## 2018-04-05 ENCOUNTER — Inpatient Hospital Stay (HOSPITAL_COMMUNITY): Payer: Medicare Other | Admitting: Physical Therapy

## 2018-04-05 ENCOUNTER — Inpatient Hospital Stay (HOSPITAL_COMMUNITY): Payer: Medicare Other | Admitting: Occupational Therapy

## 2018-04-05 LAB — GLUCOSE, CAPILLARY
Glucose-Capillary: 97 mg/dL (ref 70–99)
Glucose-Capillary: 109 mg/dL — ABNORMAL HIGH (ref 70–99)
Glucose-Capillary: 92 mg/dL (ref 70–99)
Glucose-Capillary: 92 mg/dL (ref 70–99)

## 2018-04-05 NOTE — Progress Notes (Signed)
Physical Therapy Session Note  Patient Details  Name: Marcus Beasley MRN: [id number] Date of Birth: 10-14-1945  Today's Date: 04/05/2018 PT Individual Time: 1415-1530 PT Individual Time Calculation (min): 75 min   Short Term Goals: Week 3:  PT Short Term Goal 1 (Week 3): Pt will perform bed mobility S from flat bed PT Short Term Goal 2 (Week 3): Pt will perform squat pivot transfers consistent min guard R/L PT Short Term Goal 3 (Week 3): Pt will ambulate x25' modA x1 PT Short Term Goal 4 (Week 3): Pt will initiate stair training  Skilled Therapeutic Interventions/Progress Updates:    Pt received sitting in w/c and agreeable to PT services. W/c propulsion to rehab gym with R hemi technique and supervision. Today's session focused on NMR for gait training with L knee cage to prevent hyperextension. CGA/MinA sit>< stand with either R hallrail or RW and L hand splint throughout session. All ambulation trials required min/modA and mod verbal/tactile cues to facilitate midline orientation, R weightshift (to aid with LLE clearance), LLE swing through and placement (to prevent LLE from adducting/scissoring), LLE knee and hip extension, and R step through: 2 gait trials x 25' with R hall rail, 2 gait trials x 50' with R lateral/superior reaching, and 10 gait trials x 50' with RW and L hand splint. Pt tends to watch what his feet are doing by forwardly flexing. When instructed to stand upright with gaze ahead, LLE scissors inwards, causing a couple LOBs (required [place]nead by therapist to correct), and knee/hip flexes. TotalA w/c transport to day room for energy/time conservation. MinA stand pivot transfer >< kinetron. MinA standing on kinetron for focus on LLE knee and hip extension with LLE weightbearing and upright posture; mirror for visual feedback. Returned to room with totalA w/c transport. MinA stand pivot transfer to bed. S for sitting EOB>supine. Pt remained laying in bed with all 4 rails up, per his  request, bed alarm intact, and all needs in reach.  Therapy Documentation Precautions:  Precautions Precautions: Fall Precaution Comments: L hemiparesis; strong L lateral lean in standing; pushing tendancies Restrictions Weight Bearing Restrictions: No    Therapy/Group: Individual Therapy  [place] Kippy Gohman 04/05/2018, 3:51 PM

## 2018-04-05 NOTE — Progress Notes (Signed)
Occupational Therapy Session Note  Patient Details  Name: Marcus Beasley MRN: [id number] Date of Birth: Jan 07, 1946  Today's Date: 04/05/2018 OT Individual Time: 6381-7711 OT Individual Time Calculation (min): 58 min   Short Term Goals: Week 3:  OT Short Term Goal 1 (Week 3): Pt will don pants with mod A using AE PRN OT Short Term Goal 2 (Week 3): Pt will complete 1/3 toileting tasks with no more than mod steadying assist in order to decrease caregiver burden OT Short Term Goal 3 (Week 3): Pt will perform bathing tasks seated in supported chair with AE PRN and mod A in prep for d/c home for sponge bathing OT Short Term Goal 4 (Week 3): Pt will perform functional transfers with CGA to decrease caregiver burden   Skilled Therapeutic Interventions/Progress Updates:    Pt greeted in w/c with no c/o pain and ADL needs met. Took him outdoors via w/c to enhance psychosocial wellbeing. As preparatory activity, OT completed gentle stretching to Lt arm in shoulder extension, external rotation, and wrist extension to break up flexor synergies. Next had pt complete towel slides with instruction for self ROM technique. Manual facilitation initially required for keeping digits extended, but over time, digits able to maintain extension without physical assist. Core activation with pt completing circumduction arm exercises while also moving trunk in circular pattern. He also reported increased tightness in back. Guided pt through gentle yoga-based stretches for back including twists, lateral bends, and thoracic flexion/extension. Education emphasis placed on relaxing Rt shoulder, diaphragmatic breathing, and keeping back off of w/c to engage core musculature. At end of session pt was returned to room and remained in w/c for next therapist. All needs within reach and safety belt fastened.   Therapy Documentation Precautions:  Precautions Precautions: Fall Precaution Comments: L hemiparesis; strong L lateral  lean in standing; pushing tendancies Restrictions Weight Bearing Restrictions: No Pain: No c/o pain during session    ADL: ADL Equipment Provided: Other (comment)(elastic shoe laces) Grooming: Setup Where Assessed-Grooming: Wheelchair Upper Body Bathing: Minimal assistance Where Assessed-Upper Body Bathing: Shower Lower Body Bathing: Minimal assistance Where Assessed-Lower Body Bathing: Shower Upper Body Dressing: Supervision/safety Where Assessed-Upper Body Dressing: Wheelchair Lower Body Dressing: Moderate assistance Where Assessed-Lower Body Dressing: Standing at sink, Wheelchair Toileting: Moderate assistance(Standing in STEDY) Where Assessed-Toileting: Glass blower/designer: Moderate verbal cueing, Dependent Toilet Transfer Method: Squat pivot(To toilet: squat pivot mod A + 2; STEDY off of toilet) Toilet Transfer Equipment: Raised toilet seat, Grab bars Tub/Shower Transfer: (STEDY) Tub/Shower Equipment: (BSC) Walk-In Shower Transfer: Moderate assistance, Minimal cueing Social research officer, government Method: Radiographer, therapeutic: Shower seat with back, Grab bars     Therapy/Group: Individual Therapy  Emmauel Hallums A Korbin Notaro 04/05/2018, 4:06 PM

## 2018-04-05 NOTE — Progress Notes (Signed)
[facility] PHYSICAL MEDICINE & REHABILITATION PROGRESS NOTE  Subjective/Complaints: Patient seen sitting up in bed this morning.  He states he slept well overnight.  He states he had a good weekend.  He states he walked up some stairs.  ROS: Denies CP, SOB, nausea, vomiting, diarrhea.  Objective: Vital Signs: Blood pressure 115/85, pulse 67, temperature 98.5 F (36.9 C), temperature source Oral, resp. rate 16, height 5\' 11"  (1.803 m), weight 101.3 kg, SpO2 96 %. No results found. No results for input(s): WBC, HGB, HCT, PLT in the last 72 hours. No results for input(s): NA, K, CL, CO2, GLUCOSE, BUN, CREATININE, CALCIUM in the last 72 hours.  Physical Exam: BP 115/85 (BP Location: Right Arm)   Pulse 67   Temp 98.5 F (36.9 C) (Oral)   Resp 16   Ht 5\' 11"  (1.803 m)   Wt 101.3 kg   SpO2 96%   BMI 31.15 kg/m  Constitutional: No distress . Vital signs reviewed. HENT: Normocephalic.  Atraumatic. Eyes: EOMI. No discharge. Cardiovascular:  RRR.  No JVD. Respiratory: CTA  bilaterally.  Normal effort. GI: BS +. Non-distended. Musc: No edema or tenderness in extremities. Neurological: He is alert and oriented Follows basic commands Motor: RUE/RLE: 5/5 proximal to distal LUE: Shoulder abduction 2-/5, elbow flex 4-/5, elbow extension 2+/5, hand grip 4-4+/5, improving LLE: HF, KE, ADF 4/5,  stable No increase in tone Left facial droop Skin: Skin is warm and dry.  Psychiatric: He has a normal mood and affect. His behavior is normal. Thought content normal.   Assessment/Plan: 1. Functional deficits secondary to right thalamic hemorrhage which require 3+ hours per day of interdisciplinary therapy in a comprehensive inpatient rehab setting.  Physiatrist is providing close team supervision and 24 hour management of active medical problems listed below.  Physiatrist and rehab team continue to assess barriers to discharge/monitor patient progress toward functional and medical  goals  Care Tool:  Bathing    Body parts bathed by patient: Left arm, Chest, Abdomen, Front perineal area, Right upper leg, Left upper leg, Right lower leg, Left lower leg, Face, Buttocks   Body parts bathed by helper: Right arm     Bathing assist Assist Level: Contact Guard/Touching assist     Upper Body Dressing/Undressing Upper body dressing   What is the patient wearing?: Pull over shirt    Upper body assist Assist Level: Supervision/Verbal cueing    Lower Body Dressing/Undressing Lower body dressing      What is the patient wearing?: Underwear/pull up, Pants     Lower body assist Assist for lower body dressing: Moderate Assistance - Patient 50 - 74%     Toileting Toileting    Toileting assist Assist for toileting: Minimal Assistance - Patient > 75% Assistive Device Comment: (urinal)   Transfers Chair/bed transfer  Transfers assist     Chair/bed transfer assist level: Moderate Assistance - Patient 50 - 74%     Locomotion Ambulation   Ambulation assist      Assist level: Moderate Assistance - Patient 50 - 74% Assistive device: Other (comment)(L hand splint, knee cage) Max distance: 50   Walk 10 feet activity   Assist  Walk 10 feet activity did not occur: Safety/medical concerns  Assist level: Moderate Assistance - Patient - 50 - 74% Assistive device: Walker-rolling(L hand splint and L knee cage)   Walk 50 feet activity   Assist Walk 50 feet with 2 turns activity did not occur: Safety/medical concerns  Assist level: Moderate Assistance - Patient -  50 - 74% Assistive device: Walker-rolling(L hand splint and L knee cage)    Walk 150 feet activity   Assist Walk 150 feet activity did not occur: Safety/medical concerns  Assist level: Minimal Assistance - Patient > 75% Assistive device: Walker-rolling(L hand splint and L knee cage)    Walk 10 feet on uneven surface  activity   Assist Walk 10 feet on uneven surfaces activity did not  occur: Safety/medical concerns         Wheelchair     Assist Will patient use wheelchair at discharge?: Yes Type of Wheelchair: Manual    Wheelchair assist level: Supervision/Verbal cueing Max wheelchair distance: 150    Wheelchair 50 feet with 2 turns activity    Assist        Assist Level: Supervision/Verbal cueing   Wheelchair 150 feet activity     Assist Wheelchair 150 feet activity did not occur: Safety/medical concerns   Assist Level: Supervision/Verbal cueing      Medical Problem List and Plan: 1. Left-sided weakness secondary to right thalamic hemorrhage secondary to hypertensive crisis             Cont CIR 2. DVT Prophylaxis/Anticoagulation: Subcutaneous heparin initiated 03/14/2018. Monitor for any bleeding episodes 3. Pain Management: Tylenol as needed  Gabapentin 100 3 times daily started on 10/10-with improvement 4. Mood: Provide emotional support 5. Neuropsych: This patient is capable of making decisions on his own behalf. 6. Skin/Wound Care: Routine skin checks 7. Fluids/Electrolytes/Nutrition: Routine in and outs              BMP within acceptable range on 10/11 8. PAF with RVR. Cardizem CD 180 mg daily, Lopressor 50 mg twice daily. Cardiac rate controlled. Follow-up cardiology services 9. Diabetes mellitus.Hemoglobin A1c 6.4. Glucophage 850 mg twice daily. Check blood sugars before meals and at bedtime.              CBG (last 3)  Recent Labs    04/04/18 1621 04/04/18 2140 04/05/18 0630  GLUCAP 99 112* 97    Controlled 10/14 10. Hyperlipidemia. Patient on Lipitor prior to admission 11.  Hypertension  Today's Vitals   04/04/18 2000 04/04/18 2100 04/04/18 2156 04/05/18 0605  BP: 128/85   115/85  Pulse: 73   67  Resp: 18   16  Temp: 98.4 F (36.9 C)   98.5 F (36.9 C)  TempSrc:    Oral  SpO2: 96%   96%  Weight:      Height:      PainSc:  0-No pain 3     Body mass index is 31.15 kg/m.  Continue  Lopressor  Controlled on 10/14    LOS: 19 days A FACE TO FACE EVALUATION WAS PERFORMED  Analycia Khokhar [name] 04/05/2018, 8:50 AM

## 2018-04-05 NOTE — Progress Notes (Signed)
Occupational Therapy Session Note  Patient Details  Name: Marcus Beasley MRN: [id number] Date of Birth: 16-Mar-1946  Today's Date: 04/05/2018 OT Individual Time:  1045-1200 (75 mins)       Short Term Goals: Week 3:  OT Short Term Goal 1 (Week 3): Pt will don pants with mod A using AE PRN OT Short Term Goal 2 (Week 3): Pt will complete 1/3 toileting tasks with no more than mod steadying assist in order to decrease caregiver burden OT Short Term Goal 3 (Week 3): Pt will perform bathing tasks seated in supported chair with AE PRN and mod A in prep for d/c home for sponge bathing OT Short Term Goal 4 (Week 3): Pt will perform functional transfers with CGA to decrease caregiver burden   Skilled Therapeutic Interventions/Progress Updates:    Pt sitting in w/c upon entry with granddaughter present and assisting with grooming at sink. Pt performed squat pivot transfers with CGA for initiation of transfer and min A for direction of hips. Sit to stand transition with RW and min A and varying CGA-mod A for maintaining standing position with facilitation at knee for neutral positioning.   Discussed with pt regarding different methods of performing toileting tasks at home and how much assist each method caregivers would need to provide. Education provided regarding standing vs lateral leans for clothing mgmt with pt reporting he would prefer lateral leans for d/c home. Pt transferred to [facility] for multiple trials of lateral leans to L side for weight bearing and simulation of lateral leans for toileting tasks at home with facilitation provided at elbow for extension by therapist. Pt with difficulty extending elbow however able with increased time and min VC's for performing anterior weight shift to decrease difficulty. Pt transferred back to w/c and performed w/c mobility with hemi technique and supervision.   In room, pt participated in various NMR exercises with 1# dowel with therapist providing min-mod VC's  for body positioning and technique with fair carryover and min A at times to facilitate full ROM at shoulder and elbow of LUE. Emphasis on exercises facilitating extension this session to assist with prep for self care tasks and lateral leaning. Pt left seated in w/c with belt alarm set and granddaughter present.   Therapy Documentation Precautions:  Precautions Precautions: Fall Precaution Comments: L hemiparesis; strong L lateral lean in standing; pushing tendancies Restrictions Weight Bearing Restrictions: No  ADL: ADL Lower Body Dressing: Moderate assistance Where Assessed-Lower Body Dressing: Standing at sink, Wheelchair   Therapy/Group: Individual Therapy  Dreden Rivere 04/05/2018, 12:10 PM

## 2018-04-06 ENCOUNTER — Inpatient Hospital Stay (HOSPITAL_COMMUNITY): Payer: Medicare Other | Admitting: Physical Therapy

## 2018-04-06 ENCOUNTER — Inpatient Hospital Stay (HOSPITAL_COMMUNITY): Payer: Medicare Other | Admitting: Occupational Therapy

## 2018-04-06 LAB — GLUCOSE, CAPILLARY
Glucose-Capillary: 96 mg/dL (ref 70–99)
Glucose-Capillary: 117 mg/dL — ABNORMAL HIGH (ref 70–99)
Glucose-Capillary: 115 mg/dL — ABNORMAL HIGH (ref 70–99)
Glucose-Capillary: 104 mg/dL — ABNORMAL HIGH (ref 70–99)

## 2018-04-06 NOTE — Progress Notes (Signed)
Physical Therapy Session Note  Patient Details  Name: Marcus Beasley MRN: [id number] Date of Birth: [birth year]  Today's Date: 04/06/2018 PT Individual Time: 1415-1530 PT Individual Time Calculation (min): 75 min   Short Term Goals: Week 3:  PT Short Term Goal 1 (Week 3): Pt will perform bed mobility S from flat bed PT Short Term Goal 2 (Week 3): Pt will perform squat pivot transfers consistent min guard R/L PT Short Term Goal 3 (Week 3): Pt will ambulate x25' modA x1 PT Short Term Goal 4 (Week 3): Pt will initiate stair training  Skilled Therapeutic Interventions/Progress Updates: Pt received in w/c, denies pain and agreeable to treatment. W/c propulsion R hemi technique to gym with modI. Stand pivot minA to mat table. LLE terminal hip/knee extension into sit>stand off side of mat table; requires cues and assist to reduce reliance on R extremities; poor knee control with recurvatum in stance. Sit <>stand no UE from elevated mat table, mirror for visual feedback, min guard/minA on L side; able to demo static unsupported standing x5-10 sec at a time before LOB to L. Sit >stand reps as above no UE support resisted by theraband at hips to facilitate overactive glutes into hip extension at terminal stance. Returned to w/c squat pivot min guard. W/c propulsion to room modI. Standing with RW min/modA for transition and standing balance while using urinal. Gait x75' with RW and modA overall; requires repetitive cueing for R weight shift to improve L foot clearance during swing and reduce scissoring gait pattern. Sit >supine with S. Remained in bed, four rails up per pt request, alarm intact and all needs in reach.      Therapy Documentation Precautions:  Precautions Precautions: Fall Precaution Comments: L hemiparesis; strong L lateral lean in standing; pushing tendancies Restrictions Weight Bearing Restrictions: No    Therapy/Group: Individual Therapy  Corliss Skains 04/06/2018, 4:10 PM

## 2018-04-06 NOTE — Progress Notes (Signed)
Occupational Therapy Session Note  Patient Details  Name: Marcus Beasley MRN: [id number] Date of Birth: [birth year]  Today's Date: 04/06/2018 OT Individual Time: 7824-2353 OT Individual Time Calculation (min): 75 min    Short Term Goals: Week 3:  OT Short Term Goal 1 (Week 3): Pt will don pants with mod A using AE PRN OT Short Term Goal 2 (Week 3): Pt will complete 1/3 toileting tasks with no more than mod steadying assist in order to decrease caregiver burden OT Short Term Goal 3 (Week 3): Pt will perform bathing tasks seated in supported chair with AE PRN and mod A in prep for d/c home for sponge bathing OT Short Term Goal 4 (Week 3): Pt will perform functional transfers with CGA to decrease caregiver burden   Skilled Therapeutic Interventions/Progress Updates:    Pt seen for OT session focusing on ADL re-training and functional transfers. Pt in supine upon arrival, denying pain and voicing need for toileting task. Therapist encouraging pt to complete toileting routine on [facility] in room in simulation of and in prep for home bathroom layout and routine. At this time, pt refusing to complete toileting task in room on [facility]. Mod A squat pivot transfer to standard toilet. Pt able to stand from toilet with use of R grab bar and maintain static standing balance with min A while hygiene completed total A.  He completed bathing/dressing routine while seated on BSC in prep for method he will use at d/c. Steadying assist required for dynamic sitting balance seated on BSC without back support. See below for assist levels. Min A required for controlled use of L UE to achieve full elbow extension when reaching to obtain self care items. Pt demonstrates poor muscle activation isolation abilities needed for functional reach/grasp/release sequence required. Assist required to obtain and maintain figure four sitting position in order to reach B feet to wash.  Pt returned to w/c and left seated at sink to complete  grooming tasks, awaiting PT from this position. Aware to use call bell for assist.   Therapy Documentation Precautions:  Precautions Precautions: Fall Precaution Comments: L hemiparesis; strong L lateral lean in standing; pushing tendancies Restrictions Weight Bearing Restrictions: No Pain:   ADL: ADL Equipment Provided: Other (comment)(elastic shoe laces) Grooming: Setup Where Assessed-Grooming: Wheelchair Upper Body Bathing: Minimal assistance Where Assessed-Upper Body Bathing: Other (Comment)(Sitting on BSC) Lower Body Bathing: Minimal assistance Where Assessed-Lower Body Bathing: Other (Comment)(Sitting on BSC) Upper Body Dressing: Maximal cueing, Minimal assistance Where Assessed-Upper Body Dressing: Wheelchair Lower Body Dressing: Moderate assistance Where Assessed-Lower Body Dressing: Other (Comment)(Sit<>stand from [facility]) Toileting: Moderate assistance Where Assessed-Toileting: Glass blower/designer: Moderate assistance Toilet Transfer Method: Squat pivot Toilet Transfer Equipment: Raised toilet seat, Grab bars   Therapy/Group: Individual Therapy  Aaralyn Kil L 04/06/2018, 7:18 AM

## 2018-04-06 NOTE — Progress Notes (Signed)
[facility] PHYSICAL MEDICINE & REHABILITATION PROGRESS NOTE  Subjective/Complaints: Patient seen sitting up in bed this morning.  He states he slept well overnight.  He states he is working hard in therapies.  ROS: Denies CP, SOB, nausea, vomiting, diarrhea.  Objective: Vital Signs: Blood pressure 138/75, pulse 68, temperature 98.3 F (36.8 C), temperature source Oral, resp. rate 17, height 5\' 11"  (1.803 m), weight 101.3 kg, SpO2 99 %. No results found. No results for input(s): WBC, HGB, HCT, PLT in the last 72 hours. No results for input(s): NA, K, CL, CO2, GLUCOSE, BUN, CREATININE, CALCIUM in the last 72 hours.  Physical Exam: BP 138/75 (BP Location: Right Arm)   Pulse 68   Temp 98.3 F (36.8 C) (Oral)   Resp 17   Ht 5\' 11"  (1.803 m)   Wt 101.3 kg   SpO2 99%   BMI 31.15 kg/m  Constitutional: No distress . Vital signs reviewed. HENT: Normocephalic.  Atraumatic. Eyes: EOMI. No discharge. Cardiovascular:  RRR.  No JVD. Respiratory: CTA  bilaterally.  Normal effort. GI: BS +. Non-distended. Musc: No edema or tenderness in extremities. Neurological: He is alert and oriented Follows basic commands Motor: RUE/RLE: 5/5 proximal to distal LUE: Shoulder abduction 2-/5, elbow flex 4-/5, elbow extension 2+/5, hand grip 4-4+/5, stable LLE: HF, KE, ADF 4/5,  stable No increase in tone Left facial droop Skin: Skin is warm and dry.  Psychiatric: He has a normal mood and affect. His behavior is normal. Thought content normal.   Assessment/Plan: 1. Functional deficits secondary to right thalamic hemorrhage which require 3+ hours per day of interdisciplinary therapy in a comprehensive inpatient rehab setting.  Physiatrist is providing close team supervision and 24 hour management of active medical problems listed below.  Physiatrist and rehab team continue to assess barriers to discharge/monitor patient progress toward functional and medical goals  Care Tool:  Bathing    Body  parts bathed by patient: Left arm, Chest, Abdomen, Front perineal area, Right upper leg, Left upper leg, Right lower leg, Left lower leg, Face, Buttocks   Body parts bathed by helper: Right arm     Bathing assist Assist Level: Contact Guard/Touching assist     Upper Body Dressing/Undressing Upper body dressing   What is the patient wearing?: Pull over shirt    Upper body assist Assist Level: Supervision/Verbal cueing    Lower Body Dressing/Undressing Lower body dressing      What is the patient wearing?: Underwear/pull up, Pants     Lower body assist Assist for lower body dressing: Moderate Assistance - Patient 50 - 74%     Toileting Toileting    Toileting assist Assist for toileting: Maximal Assistance - Patient 25 - 49% Assistive Device Comment: (stedy)   Transfers Chair/bed transfer  Transfers assist     Chair/bed transfer assist level: Minimal Assistance - Patient > 75%     Locomotion Ambulation   Ambulation assist      Assist level: Moderate Assistance - Patient 50 - 74% Assistive device: Walker-rolling(L knee cage and L hand splint on RW or R hallrail) Max distance: 50'   Walk 10 feet activity   Assist  Walk 10 feet activity did not occur: Safety/medical concerns  Assist level: Moderate Assistance - Patient - 50 - 74% Assistive device: Walker-rolling   Walk 50 feet activity   Assist Walk 50 feet with 2 turns activity did not occur: Safety/medical concerns  Assist level: Moderate Assistance - Patient - 50 - 74% Assistive device: Walker-rolling  Walk 150 feet activity   Assist Walk 150 feet activity did not occur: Safety/medical concerns  Assist level: Minimal Assistance - Patient > 75% Assistive device: Walker-rolling(L hand splint and L knee cage)    Walk 10 feet on uneven surface  activity   Assist Walk 10 feet on uneven surfaces activity did not occur: Safety/medical concerns         Wheelchair     Assist Will  patient use wheelchair at discharge?: Yes Type of Wheelchair: Manual    Wheelchair assist level: Supervision/Verbal cueing(R hemi technique) Max wheelchair distance: 150'    Wheelchair 50 feet with 2 turns activity    Assist        Assist Level: Supervision/Verbal cueing   Wheelchair 150 feet activity     Assist Wheelchair 150 feet activity did not occur: Safety/medical concerns   Assist Level: Supervision/Verbal cueing      Medical Problem List and Plan: 1. Left-sided weakness secondary to right thalamic hemorrhage secondary to hypertensive crisis             Cont CIR 2. DVT Prophylaxis/Anticoagulation: Subcutaneous heparin initiated 03/14/2018. Monitor for any bleeding episodes 3. Pain Management: Tylenol as needed  Gabapentin 100 3 times daily started on 10/10-with improvement 4. Mood: Provide emotional support 5. Neuropsych: This patient is capable of making decisions on his own behalf. 6. Skin/Wound Care: Routine skin checks 7. Fluids/Electrolytes/Nutrition: Routine in and outs              BMP within acceptable range on 10/11 8. PAF with RVR. Cardizem CD 180 mg daily, Lopressor 50 mg twice daily. Cardiac rate controlled. Follow-up cardiology services 9. Diabetes mellitus.Hemoglobin A1c 6.4. Glucophage 850 mg twice daily. Check blood sugars before meals and at bedtime.              CBG (last 3)  Recent Labs    04/05/18 1655 04/05/18 2147 04/06/18 0638  GLUCAP 92 92 96    Controlled 10/15 10. Hyperlipidemia. Patient on Lipitor prior to admission 11.  Hypertension  Today's Vitals   04/05/18 2014 04/05/18 2201 04/05/18 2258 04/06/18 0537  BP:    138/75  Pulse:    68  Resp:    17  Temp:    98.3 F (36.8 C)  TempSrc:    Oral  SpO2:    99%  Weight:      Height:      PainSc: 3  6  Asleep    Body mass index is 31.15 kg/m.  Continue Lopressor  Controlled on 10/15    LOS: 20 days A FACE TO FACE EVALUATION WAS PERFORMED  Shirleymae Hauth [name] 04/06/2018, 8:09 AM

## 2018-04-07 ENCOUNTER — Inpatient Hospital Stay (HOSPITAL_COMMUNITY): Payer: Medicare Other | Admitting: Occupational Therapy

## 2018-04-07 ENCOUNTER — Inpatient Hospital Stay (HOSPITAL_COMMUNITY): Payer: Medicare Other | Admitting: Physical Therapy

## 2018-04-07 ENCOUNTER — Inpatient Hospital Stay (HOSPITAL_COMMUNITY): Payer: Medicare Other | Admitting: *Deleted

## 2018-04-07 LAB — GLUCOSE, CAPILLARY
Glucose-Capillary: 91 mg/dL (ref 70–99)
Glucose-Capillary: 99 mg/dL (ref 70–99)
Glucose-Capillary: 77 mg/dL (ref 70–99)
Glucose-Capillary: 86 mg/dL (ref 70–99)

## 2018-04-07 NOTE — Progress Notes (Signed)
Physical Therapy Session Note  Patient Details  Name: Marcus Beasley MRN: [id number] Date of Birth: January 02, 1946  Today's Date: 04/07/2018 PT Individual Time: 1300-1415 PT Individual Time Calculation (min): 75 min   Short Term Goals: Week 3:  PT Short Term Goal 1 (Week 3): Pt will perform bed mobility S from flat bed PT Short Term Goal 2 (Week 3): Pt will perform squat pivot transfers consistent min guard R/L PT Short Term Goal 3 (Week 3): Pt will ambulate x25' modA x1 PT Short Term Goal 4 (Week 3): Pt will initiate stair training  Skilled Therapeutic Interventions/Progress Updates:    Pt received sitting in w/c with nurse and nurse tech present and administering meds. MinA w/c propulsion ~75' with RUE and LLE to promote LLE usage and coordination; verbal/tactile cues for placement and sequence. CGA/MinA sit to stand with RW throughout session. 2 gait trials x 53' with minA and RW with L hand splint and L knee cage to prevent buckling/recurvartum; trial 1) resisted hip abduction with theraband to prevent scissoring of LLE in swing phase of gait via facilitating hip abductors; trial 2) no resistance; pt exhibited decreased adduction of LLE during swing phase. Pt able to better control L foot placement so it doesn't adduct/scissor or ER. TotalA w/c transport to room due to urge to urinate. Urination into urinal seated in w/c. 2 more gait trials x 50' with minA and RW with L hand splint and L knee cage to prevent buckling/recurvartum; trial 1 and 2 same as above; mod verbal cueing LLE placement. Pt able to better offload LLE for L foot clearance during toe off/swing phase but does so with lateral trunk lean to R instead of true weightshifting. LOB to left when LLE scissors inwards. Corrected with modA. Pt aware of how placement affects balance; he can report when it's a "bad step." MinA stair negotiation with 1 handrail on R ascending/L descending on 8 three inch steps with step-to pattern. TotalA w/c  transport to room. MinA stand pivot transfer to bed. Pt remained in bed with alarm intact, 4 bedrails up per pt request, and all needs in reach.   Therapy Documentation Precautions:  Precautions Precautions: Fall Precaution Comments: L hemiparesis; strong L lateral lean in standing; pushing tendancies Restrictions Weight Bearing Restrictions: No General:   Vital Signs: Therapy Vitals Temp: 98.1 F (36.7 C) Pulse Rate: 80 Resp: 18 BP: (!) 123/95 Patient Position (if appropriate): Sitting Oxygen Therapy SpO2: 99 % O2 Device: Room Air   Therapy/Group: Individual Therapy  [place] Ricard Faulkner 04/07/2018, 2:21 PM

## 2018-04-07 NOTE — Plan of Care (Signed)
  Problem: RH SAFETY Goal: RH STG ADHERE TO SAFETY PRECAUTIONS W/ASSISTANCE/DEVICE Description STG Adhere to Safety Precautions With Assistance/Device. Mod I  Outcome: Progressing  Call light within, bed/chair alarm, proper footwear.

## 2018-04-07 NOTE — Progress Notes (Signed)
Cosign by Gifford Shave. [place]

## 2018-04-07 NOTE — Progress Notes (Signed)
Occupational Therapy Session Note  Patient Details  Name: Marcus Beasley MRN: [id number] Date of Birth: [birth year]  Today's Date: 04/07/2018 OT Individual Time: 1030-1200 OT Individual Time Calculation (min): 90 min    Short Term Goals: Week 3:  OT Short Term Goal 1 (Week 3): Pt will don pants with mod A using AE PRN OT Short Term Goal 2 (Week 3): Pt will complete 1/3 toileting tasks with no more than mod steadying assist in order to decrease caregiver burden OT Short Term Goal 3 (Week 3): Pt will perform bathing tasks seated in supported chair with AE PRN and mod A in prep for d/c home for sponge bathing OT Short Term Goal 4 (Week 3): Pt will perform functional transfers with CGA to decrease caregiver burden   Skilled Therapeutic Interventions/Progress Updates:    Pt sitting in w/c upon entry with requests to bathe and no reports of pain. Pt performed functional transfers with min A this session- noted improvements in toilet transfer and shower transfer using stand pivot method vs squat with L knee brace donned. Pt performed bathing tasks while seated including hygiene by using cut out of BSC. Pt able to perform hemi technique of bathing RUE this session with guarding assist for safety and min VC's required for body positioning/technique. Pt then transferred to w/c and performed grooming and dressing tasks sitting at sink. Majority of dressing tasks performed max A by therapist for time management however pt able to complete at min-mod A level.   Pt performed w/c mobility to gym with supervision using hemi technique. Pt transferred to [facility] and performed various tasks with focusing on LUE NMR, grasp and release and functional reach patterns. Pt demonstrated difficulty isolating movements of LUE therefore pt used compensatory movements of trunk and shoulder to obtain positioning and demonstrated difficulty with proprioception of various weighted items. Pt also with difficulty performing wrist  extension throughout causing pt to then manipulate RUE into compensatory position to grasp items. Pt able to complete multiple trials and repetitions of functional movement patterns and NMR with visual feedback of success in use of LUE. Pt also completed tasks involving bimanual coordination with gross grasp and crossing midline. Pt transferred back to w/c and then performed w/c mobility as mentioned above. Pt left in room seated in w/c with all needs in reach and meal tray set up.    Therapy Documentation Precautions:  Precautions Precautions: Fall Precaution Comments: L hemiparesis; strong L lateral lean in standing; pushing tendancies Restrictions Weight Bearing Restrictions: No ADL: ADL Equipment Provided: Other (comment)(elastic shoe laces) Grooming: Setup Where Assessed-Grooming: Wheelchair Upper Body Bathing: Minimal assistance Where Assessed-Upper Body Bathing: Other (Comment)(Sitting on BSC) Lower Body Bathing: Minimal assistance Where Assessed-Lower Body Bathing: Other (Comment)(Sitting on BSC) Upper Body Dressing: Maximal cueing, Minimal assistance Where Assessed-Upper Body Dressing: Wheelchair Lower Body Dressing: Moderate assistance Where Assessed-Lower Body Dressing: Other (Comment)(Sit<>stand from [facility]) Toileting: Moderate assistance(Standing in STEDY) Where Assessed-Toileting: Glass blower/designer: Moderate assistance Toilet Transfer Method: Squat pivot Toilet Transfer Equipment: Raised toilet seat, Grab bars Tub/Shower Transfer: (STEDY) Tub/Shower Equipment: (BSC) Walk-In Shower Transfer: Moderate assistance, Minimal cueing Social research officer, government Method: Radiographer, therapeutic: Shower seat with back, Grab bars  Therapy/Group: Individual Therapy  Kristion Holifield 04/07/2018, 12:42 PM

## 2018-04-07 NOTE — Progress Notes (Signed)
Physical Therapy Session Note  Patient Details  Name: Marcus Beasley MRN: [id number] Date of Birth: 08-Jul-1945  Today's Date: 04/07/2018 PT Individual Time: 1000-1100   Total Time: 60 mins  Short Term Goals: Week 3:  PT Short Term Goal 1 (Week 3): Pt will perform bed mobility S from flat bed PT Short Term Goal 2 (Week 3): Pt will perform squat pivot transfers consistent min guard R/L PT Short Term Goal 3 (Week 3): Pt will ambulate x25' modA x1 PT Short Term Goal 4 (Week 3): Pt will initiate stair training  Skilled Therapeutic Interventions/Progress Updates:    Pt received sitting in w/c; agreeable to PT. Pt reports feeling physically tired today. Supervision w/c propulsion to/from rehab gym with R hemi technique. CGA/MinA sit><stand with RW and L hand splint. Attempted tall kneeling side steps, but due to inability to weightshift enough to clear opposite LE, regressed to tall kneeling weight shifts. Tall kneeling weightshifts with UE support on bench and mirror for visual feedback; mod/heavy verbal and tactile cues for glute activation, particularly on the L, and upright, midline posture; pt demonstrated the ability to weightshift to the L and able to correct self to midline with more ease; required a few laying rest breaks due to muscle fatigue, pressure at knees due to position, and overall tiredness. Attempted quadruped positioning but unable to get into position due to lack of LUE strength. Supine single leg bridges on each side (more reps on L) and supine hip extension with LLE off the table pressing into wooden block for glute strengthening and to promote L hip extension in gait. CGAMinA squat pivot transfer to w/c and w/c>bed. PROM of LUE for relaxation purposes as pt states that "moving it makes it feel better." S bed mobility. Pt remained in bed at the end of the session with all 4 bed rails up (per pt request) and all needs in reach.   Therapy Documentation Precautions:   Precautions Precautions: Fall Precaution Comments: L hemiparesis; strong L lateral lean in standing; pushing tendancies Restrictions Weight Bearing Restrictions: No    Therapy/Group: Individual Therapy  [place] Ameliah Baskins, SPT 04/07/2018, 8:15 AM

## 2018-04-07 NOTE — Progress Notes (Signed)
Physical Therapy Session Note  Patient Details  Name: Marcus Beasley MRN: [id number] Date of Birth: 1946-03-17  Today's Date: 04/07/2018 PT Individual Time: 0810-0900 PT Individual Time Calculation (min): 50 min   Short Term Goals: Week 3:  PT Short Term Goal 1 (Week 3): Pt will perform bed mobility S from flat bed PT Short Term Goal 2 (Week 3): Pt will perform squat pivot transfers consistent min guard R/L PT Short Term Goal 3 (Week 3): Pt will ambulate x25' modA x1 PT Short Term Goal 4 (Week 3): Pt will initiate stair training  Skilled Therapeutic Interventions/Progress Updates: Pt received in bed eating breakfast; requests to finish breakfast before participating in therapy; missed 10 min PT. Upon therapist's return, pt denies pain and agreeable to treatment. Supine>sit with S. Stand pivot transfer bed>w/c<>toilet with grab bars minA overall. Hygiene and clothing management performed totalA for time management, S for standing balance with grab bar while therapist completing. Hygiene at sink with modI sitting in w/c, increased time required. W/c propulsion to/from gym with R hemi technique and modI. Gait x2 trials 67' each with RW, L hand splint, L knee cage and modA with verbal/tactile cues for postural control and midline orientation. Standing balance from edge of mat table with RW and min guard sit >stand. Static standing with LUE support in hand splint while reaching within BOS to retrieve playing card and match to board; pt able to demo static standing balance x5-10 sec at a time before gradually losing balance/leaning to L side requiring verbal/tactile cues to correct. Returned to room as above; remained seated in w/c at end of session, lap belt alarm intact, RN present and all needs in reach.      Therapy Documentation Precautions:  Precautions Precautions: Fall Precaution Comments: L hemiparesis; strong L lateral lean in standing; pushing tendancies Restrictions Weight Bearing  Restrictions: No General: PT Amount of Missed Time (min): 10 Minutes PT Missed Treatment Reason: Other (Comment)(breakfast)    Therapy/Group: Individual Therapy  Corliss Skains 04/07/2018, 9:12 AM

## 2018-04-07 NOTE — Evaluation (Signed)
Recreational Therapy Assessment and Plan  Patient Details  Name: Marcus Beasley MRN: [id number] Date of Birth: 12-14-1945 Today's Date: 04/07/2018  Rehab Potential: Good ELOS: discharge 10/24  Assessment Problem List:      Patient Active Problem List   Diagnosis Date Noted  . 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 Diabetes mellitus type 2 in nonobese (HCC)   . Benign essential HTN   . Dyslipidemia   . Paroxysmal A-fib ([place])   . Hemorrhagic stroke ([place])   . ICH (intracerebral hemorrhage) ([place]) 03/12/2018  . Prostate cancer ([place]) 01/29/2017  . 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 Diabetes ([place]) 12/14/2015  . PCP NOTES >>>>>>>>>>>>>>>>>>>>>>>>>>>>>>. 08/06/2015  . Dizziness and giddiness 01/29/2015  . Umbilical hernia 41/08/129  . Elevated PSA, less than 10 ng/ml 04/19/2012  . Annual physical exam 01/24/2011  . Hyperlipidemia 01/03/2010  . Essential hypertension 09/20/2007    Past Medical History:      Past Medical History:  Diagnosis Date  . BPH (benign prostatic hyperplasia)    (-) Bx 2015  . Diabetes mellitus without complication ([place])   . Elevated PSA    Prostate Bx in 06/2013 was benign  . HTN (hypertension)   . Hyperlipidemia   . Macular degeneration, age related    Past Surgical History:       Past Surgical History:  Procedure Laterality Date  . ABCESS DRAINAGE     abdomen- 26 day hospitalization 1968  . COLONOSCOPY  2016  . HERNIA REPAIR  summer '11   umbilical, dr Ninfa Linden   . POLYPECTOMY    . PROSTATE BIOPSY  06-2013 , 07-2017   (-), (-)    Assessment & Plan Clinical Impression: Patient transferred to CIR on 03/17/2018 . Marcus Beasley is a 72 year old right-handed male history of diabetes mellitus, hypertension, hyperlipidemia. Per chart review, patient and family, patient lives with spouse. Independent prior to admission and retired. Multilevel home with 6 steps to entry. Wife can assist as  needed. 2 daughters in the area work. Presented 03/12/2018 with left-sided weakness and slurred speech with facial numbness. Blood pressure 168/98. MRI reviewed, showing right thalamic hemorrhage. 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 Carotid Dopplers with no ICA stenosis. Echocardiogram with ejection fraction of 70% no wall motion abnormalities. During admission patient developed PAF with RVR received intravenous Cardizem. Cardiology services consulted and currently maintained with rate control on metoprolol as well as Cardizem. Subcutaneous heparin for DVT prophylaxis initiated 03/14/2018. Tolerating a regular diet. Therapy evaluations completed with recommendations of physical medicine rehab consult. Patient was admitted for a comprehensive rehab program.  Pt presents with decreased activity tolerance, decreased functional mobility, decreased balance, decreased coordination and decreased motor planning, decreased visual acuity, decreased visual perceptual skills, decreased midline orientation Limiting pt's independence with leisure/community pursuits.   Plan Min 1 TR session >20 minutes during LOS  Recommendations for other services: None   Discharge Criteria: Patient will be discharged from TR if patient refuses treatment 3 consecutive times without medical reason.  If treatment goals not met, if there is a change in medical status, if patient makes no progress towards goals or if patient is discharged from hospital.  The above assessment, treatment plan, treatment alternatives and goals were discussed and mutually agreed upon: by patient  [place] 04/07/2018, 3:13 PM

## 2018-04-07 NOTE — Progress Notes (Signed)
[place] PHYSICAL MEDICINE & REHABILITATION PROGRESS NOTE  Subjective/Complaints: Patient seen sitting up in bed this morning.  He states he slept well overnight.  Wants to know if his discharge date is still set for the 24th.  ROS: Denies CP, SOB, nausea, vomiting, diarrhea.  Objective: Vital Signs: Blood pressure 136/89, pulse 71, temperature 98.5 F (36.9 C), resp. rate 19, height 5\' 11"  (1.803 m), weight 101.3 kg, SpO2 96 %. No results found. No results for input(s): WBC, HGB, HCT, PLT in the last 72 hours. No results for input(s): NA, K, CL, CO2, GLUCOSE, BUN, CREATININE, CALCIUM in the last 72 hours.  Physical Exam: BP 136/89 (BP Location: Right Arm)   Pulse 71   Temp 98.5 F (36.9 C)   Resp 19   Ht 5\' 11"  (1.803 m)   Wt 101.3 kg   SpO2 96%   BMI 31.15 kg/m  Constitutional: No distress . Vital signs reviewed. HENT: Normocephalic.  Atraumatic. Eyes: EOMI. No discharge. Cardiovascular:  RRR.  No JVD. Respiratory: CTA  bilaterally.  Normal effort. GI: BS +. Non-distended. Musc: No edema or tenderness in extremities. Neurological: He is alert and oriented Follows basic commands Motor: RUE/RLE: 5/5 proximal to distal LUE: Shoulder abduction 2-/5, elbow flex 4-/5, elbow extension 2+/5, hand grip 4-4+/5, unchanged LLE: HF, KE, ADF 4/5,  unchanged No increase in tone Left facial droop Skin: Skin is warm and dry.  Psychiatric: He has a normal mood and affect. His behavior is normal. Thought content normal.   Assessment/Plan: 1. Functional deficits secondary to right thalamic hemorrhage which require 3+ hours per day of interdisciplinary therapy in a comprehensive inpatient rehab setting.  Physiatrist is providing close team supervision and 24 hour management of active medical problems listed below.  Physiatrist and rehab team continue to assess barriers to discharge/monitor patient progress toward functional and medical goals  Care Tool:  Bathing    Body parts  bathed by patient: Left arm, Chest, Abdomen, Front perineal area, Right upper leg, Left upper leg, Right lower leg, Left lower leg, Face, Buttocks, Right arm   Body parts bathed by helper: Right arm     Bathing assist Assist Level: Contact Guard/Touching assist     Upper Body Dressing/Undressing Upper body dressing   What is the patient wearing?: Pull over shirt    Upper body assist Assist Level: Minimal Assistance - Patient > 75%    Lower Body Dressing/Undressing Lower body dressing      What is the patient wearing?: Underwear/pull up, Pants     Lower body assist Assist for lower body dressing: Moderate Assistance - Patient 50 - 74%     Toileting Toileting    Toileting assist Assist for toileting: Independent with assistive device Assistive Device Comment: urinal   Transfers Chair/bed transfer  Transfers assist     Chair/bed transfer assist level: Minimal Assistance - Patient > 75%     Locomotion Ambulation   Ambulation assist      Assist level: Moderate Assistance - Patient 50 - 74% Assistive device: Other (comment)(L hand splint, L knee cage) Max distance: 75   Walk 10 feet activity   Assist  Walk 10 feet activity did not occur: Safety/medical concerns  Assist level: Moderate Assistance - Patient - 50 - 74% Assistive device: Walker-rolling   Walk 50 feet activity   Assist Walk 50 feet with 2 turns activity did not occur: Safety/medical concerns  Assist level: Moderate Assistance - Patient - 50 - 74% Assistive device: Walker-rolling  Walk 150 feet activity   Assist Walk 150 feet activity did not occur: Safety/medical concerns  Assist level: Minimal Assistance - Patient > 75% Assistive device: Walker-rolling(L hand splint and L knee cage)    Walk 10 feet on uneven surface  activity   Assist Walk 10 feet on uneven surfaces activity did not occur: Safety/medical concerns         Wheelchair     Assist Will patient use  wheelchair at discharge?: Yes Type of Wheelchair: Manual    Wheelchair assist level: Independent Max wheelchair distance: 150'    Wheelchair 50 feet with 2 turns activity    Assist        Assist Level: Independent   Wheelchair 150 feet activity     Assist Wheelchair 150 feet activity did not occur: Safety/medical concerns   Assist Level: Independent      Medical Problem List and Plan: 1. Left-sided weakness secondary to right thalamic hemorrhage secondary to hypertensive crisis             Cont CIR  Team conference today to discuss current and goals and coordination of care, home and environmental barriers, and discharge planning with nursing, case manager, and therapies.  2. DVT Prophylaxis/Anticoagulation: Subcutaneous heparin initiated 03/14/2018. Monitor for any bleeding episodes 3. Pain Management: Tylenol as needed  Gabapentin 100 3 times daily started on 10/10-with improvement 4. Mood: Provide emotional support 5. Neuropsych: This patient is capable of making decisions on his own behalf. 6. Skin/Wound Care: Routine skin checks 7. Fluids/Electrolytes/Nutrition: Routine in and outs              BMP within acceptable range on 10/11 8. PAF with RVR. Cardizem CD 180 mg daily, Lopressor 50 mg twice daily. Cardiac rate controlled. Follow-up cardiology services 9. Diabetes mellitus.Hemoglobin A1c 6.4. Glucophage 850 mg twice daily. Check blood sugars before meals and at bedtime.              CBG (last 3)  Recent Labs    04/06/18 1637 04/06/18 2120 04/07/18 0703  GLUCAP 115* 104* 91    Controlled 10/16 10. Hyperlipidemia. Patient on Lipitor prior to admission 11.  Hypertension  Today's Vitals   04/06/18 1857 04/06/18 2123 04/06/18 2135 04/06/18 2231  BP: 130/82 136/89    Pulse: 69 71    Resp: 18 19    Temp: 98.5 F (36.9 C) 98.5 F (36.9 C)    TempSrc: Oral     SpO2: 97% 96%    Weight:      Height:      PainSc:   6  2    Body mass  index is 31.15 kg/m.  Continue Lopressor  Controlled on 10/16  LOS: 21 days A FACE TO FACE EVALUATION WAS PERFORMED  Lasondra Hodgkins [name] 04/07/2018, 8:16 AM

## 2018-04-07 NOTE — Patient Care Conference (Signed)
Inpatient RehabilitationTeam Conference and Plan of Care Update Date: 04/07/2018   Time: 10:20 AM    Patient Name: [place] Record Number: [id number]  Date of Birth: [birth year] Sex: Male         Room/Bed: 4M07C/4M07C-01 Payor Info: Payor: Marine scientist / Plan: [employer] MEDICARE / Product Type: *No Product type* /    Admitting Diagnosis: R ICH  Admit Date/Time:  03/17/2018  3:24 PM Admission Comments: No comment available   Primary Diagnosis:  <principal problem not specified> Principal Problem: <principal problem not specified>  Patient Active Problem List   Diagnosis Date Noted  . Neuropathic pain   . Labile blood pressure   . PAF (paroxysmal atrial fibrillation) ([place])   . Diabetes mellitus type 2 in obese ([place])   . Hemiparesis affecting left side as late effect of stroke ([place])   . 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 Diabetes mellitus type 2 in nonobese ([facility])   . Benign essential HTN   . Dyslipidemia   . Paroxysmal A-fib ([place])   . Hemorrhagic stroke ([place])   . ICH (intracerebral hemorrhage) ([place]) 03/12/2018  . Prostate cancer ([place]) 01/29/2017  . 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 Diabetes ([place]) 12/14/2015  . PCP NOTES >>>>>>>>>>>>>>>>>>>>>>>>>>>>>>. 08/06/2015  . Dizziness and giddiness 01/29/2015  . Umbilical hernia 44/81/8563  . Elevated PSA, less than 10 ng/ml 04/19/2012  . Annual physical exam 01/24/2011  . Hyperlipidemia 01/03/2010  . Essential hypertension 09/20/2007    Expected Discharge Date: Expected Discharge Date: 04/15/18  Team Members Present: Physician leading conference: Dr. Delice Lesch Social Worker Present: Ovidio Kin, LCSW Nurse Present: Other (comment)(Denise Lloyd-RN) PT Present: Kem Parkinson, PT OT Present: Napoleon Form, OT SLP Present: Windell Moulding, SLP PPS Coordinator present : Daiva Nakayama, RN, CRRN     Current Status/Progress Goal Weekly Team Focus  Medical   Left-sided weakness  secondary to right thalamic hemorrhage secondary to hypertensive crisis  Improve mobility, safety  See above   Bowel/Bladder   continent of bowel and bladder, LBM 04-06-18  remain continent of bowel and bladder, maintain regular bowel pattern  Assist with toileting needs prn, assess bowel pattern q shift    Swallow/Nutrition/ Hydration             ADL's   min-mod squat pivot transfers; mod-max A standing balance during functional tasks; Supervision-min A UB bathing/dressing; min-mod A LB dressing. UE ROM with synergistic patterns though able to move through full ROM  supervision for all self care goals except grooming, may need to downgrade if standing balance does not get to functional level for caregiver to safely assist  ADL re-training, neuro re-ed, functional standing balance, initiate family ed, d/c planning   Mobility   S bed mobility, minA transfers, mod/maxA gait  S transfers, minA overall   postural control/midline orientation, standing balance, transfer/gait training   Communication             Safety/Cognition/ Behavioral Observations            Pain   left shoulder pain control with muscle rub and prn tramdol  pain <3  Assess pain q shift and prn   Skin   CDI  no new skin issues  Assess q shift and prn      *See Care Plan and progress notes for long and short-term goals.     Barriers to Discharge  Current Status/Progress Possible Resolutions Date Resolved   Physician  Medical stability     See above  Therapies      Nursing                  PT                    OT                  SLP                SW                Discharge Planning/Teaching Needs:  Question if wife can provide the care pt will need at discharge. Will ask her to come in and attend therapies again and to begin hands on care.      Team Discussion:  Progressing but still requiring many cues for leaning and awareness. Therapy team may downgrade goals to min assist level. Chronic health  issues stable according to MD. Getting return in left arm. Memory and problem solving issues. Will need wife in to see if she can provide min assist level.  Revisions to Treatment Plan:  DC 10/24    Continued Need for Acute Rehabilitation Level of Care: The patient requires daily medical management by a physician with specialized training in physical medicine and rehabilitation for the following conditions: Daily direction of a multidisciplinary physical rehabilitation program to ensure safe treatment while eliciting the highest outcome that is of practical value to the patient.: Yes Daily medical management of patient stability for increased activity during participation in an intensive rehabilitation regime.: Yes Daily analysis of laboratory values and/or radiology reports with any subsequent need for medication adjustment of medical intervention for : Neurological problems   I attest that I was present, lead the team conference, and concur with the assessment and plan of the team.   Elease Hashimoto 04/07/2018, 2:03 PM

## 2018-04-08 ENCOUNTER — Inpatient Hospital Stay (HOSPITAL_COMMUNITY): Payer: Medicare Other

## 2018-04-08 ENCOUNTER — Inpatient Hospital Stay (HOSPITAL_COMMUNITY): Payer: Medicare Other | Admitting: Physical Therapy

## 2018-04-08 ENCOUNTER — Inpatient Hospital Stay (HOSPITAL_COMMUNITY): Payer: Medicare Other | Admitting: Occupational Therapy

## 2018-04-08 LAB — GLUCOSE, CAPILLARY
Glucose-Capillary: 110 mg/dL — ABNORMAL HIGH (ref 70–99)
Glucose-Capillary: 144 mg/dL — ABNORMAL HIGH (ref 70–99)
Glucose-Capillary: 98 mg/dL (ref 70–99)
Glucose-Capillary: 99 mg/dL (ref 70–99)

## 2018-04-08 NOTE — Progress Notes (Signed)
Physical Therapy Weekly Progress Note  Patient Details  Name: Marcus Beasley MRN: [id number] Date of Birth: 04/06/1946  Beginning of progress report period: April 01, 2018 End of progress report period: April 08, 2018  Today's Date: 04/08/2018 PT Individual Time: 1345-1430 PT Individual Time Calculation (min): 45 min   Patient has met 4 of 4 short term goals.  Pt currently requires S for bed mobility, min guard for squat/stand pivot transfers, min/modA for gait with RW, however very slow and non-functional with an unskilled family member at this time d/t occasional strong LLE scissoring with LOB to L side. Will require family education prior to d/c as pt anticipated to discharge at [facility] level.   Patient continues to demonstrate the following deficits muscle weakness, decreased cardiorespiratoy endurance, impaired timing and sequencing, abnormal tone, unbalanced muscle activation and decreased coordination and decreased standing balance, decreased postural control, hemiplegia and decreased balance strategies and therefore will continue to benefit from skilled PT intervention to increase functional independence with mobility.  Patient progressing toward long term goals..  Continue plan of care.  PT Short Term Goals Week 3:  PT Short Term Goal 1 (Week 3): Pt will perform bed mobility S from flat bed PT Short Term Goal 1 - Progress (Week 3): Met PT Short Term Goal 2 (Week 3): Pt will perform squat pivot transfers consistent min guard R/L PT Short Term Goal 2 - Progress (Week 3): Met PT Short Term Goal 3 (Week 3): Pt will ambulate x25' modA x1 PT Short Term Goal 3 - Progress (Week 3): Met PT Short Term Goal 4 (Week 3): Pt will initiate stair training PT Short Term Goal 4 - Progress (Week 3): Met Week 4:  PT Short Term Goal 1 (Week 4): =LTG due to estimated LOS  Skilled Therapeutic Interventions/Progress Updates: Pt received in bed, denies pain and agreeable to treatment. Supine>sit with  S; cues to utilize LUE to pull sheets off. Shoes donned totalA for time management. Standing balance min guard with R bedrail to use urinal. Stand pivot min guard to w/c. Gait trial minA x50' including turns; improving L foot placement with reduced scissoring. Transferred to gym [place] for time/energy conservation. Side stepping R/L with hall rail for focus on weight shifting, LLE hip abduction strength and knee control in stance; cues to maintain neutral upright trunk when abducting LLE to prevent compensatory R lateral trunk flexion. Standing balance with BUE reaching slightly outside BOS to facilitate righting reactions and return to midline; occasional LOBs requiring modA to recover and verbal cues. Standing balance/weight shifting on kinetron for focus on isolated LLE hip/knee extension. Sit >stand x10 reps with LLE extended, RLE in flexed position to inhibit pushing. Returned to room totalA; stand pivot to bed min guard. Remained in bed, alarm intact and all needs in reach at completion of session.      Therapy Documentation Precautions:  Precautions Precautions: Fall Precaution Comments: L hemiparesis; strong L lateral lean in standing; pushing tendancies Restrictions Weight Bearing Restrictions: No   Therapy/Group: Individual Therapy  Corliss Skains 04/08/2018, 3:44 PM

## 2018-04-08 NOTE — Progress Notes (Signed)
Social Work Patient ID: Marcus Beasley, male   DOB: 10-28-1945, 72 y.o.   MRN: [id number]  Spoke with wife via telephone to discuss team conference progress and the need for her to come in and do hands on education. She voiced she doesn't drive and has to get her grandson off of the bus daily. Discussed the concern if she can provide the care he will need at discharge and want her to see this prior to discharge home next Thursday. She will try to get a ride and come in Wed, but will call this worker to let her know on Tuesday, so schedule can be accommodated for her when she is here. Pt aware of this plan and voiced the therapist told him about another facility if his wife can not provide care to him at discharge. Will work on best plan for pt.

## 2018-04-08 NOTE — Plan of Care (Signed)
  Problem: RH SAFETY Goal: RH STG ADHERE TO SAFETY PRECAUTIONS W/ASSISTANCE/DEVICE Description STG Adhere to Safety Precautions With Assistance/Device. Mod I  Outcome: Progressing  Call light within reach, bed/chair alarm, protective footwear

## 2018-04-08 NOTE — Progress Notes (Signed)
[place] PHYSICAL MEDICINE & REHABILITATION PROGRESS NOTE  Subjective/Complaints: Patient seen sitting up in bed this morning.  He states he slept well overnight.  He notes improvement in left upper extremity.  ROS: Denies CP, SOB, nausea, vomiting, diarrhea.  Objective: Vital Signs: Blood pressure 114/79, pulse 77, temperature 98.7 F (37.1 C), temperature source Oral, resp. rate 20, height 5\' 11"  (1.803 m), weight 101.3 kg, SpO2 95 %. No results found. No results for input(s): WBC, HGB, HCT, PLT in the last 72 hours. No results for input(s): NA, K, CL, CO2, GLUCOSE, BUN, CREATININE, CALCIUM in the last 72 hours.  Physical Exam: BP 114/79 (BP Location: Right Arm)   Pulse 77   Temp 98.7 F (37.1 C) (Oral)   Resp 20   Ht 5\' 11"  (1.803 m)   Wt 101.3 kg   SpO2 95%   BMI 31.15 kg/m  Constitutional: No distress . Vital signs reviewed. HENT: Normocephalic.  Atraumatic. Eyes: EOMI. No discharge. Cardiovascular:  RRR.  No JVD. Respiratory: CTA  bilaterally.  Normal effort. GI: BS +. Non-distended. Musc: No edema or tenderness in extremities. Neurological: He is alert and oriented Follows basic commands Motor: RUE/RLE: 5/5 proximal to distal LUE: Shoulder abduction 3-/5, elbow flex 4-/5, elbow extension 3+/5, hand grip 4-4+/5,  LLE: HF, KE, ADF 4/5,  stable No increase in tone Left facial droop Skin: Skin is warm and dry.  Psychiatric: He has a normal mood and affect. His behavior is normal. Thought content normal.   Assessment/Plan: 1. Functional deficits secondary to right thalamic hemorrhage which require 3+ hours per day of interdisciplinary therapy in a comprehensive inpatient rehab setting.  Physiatrist is providing close team supervision and 24 hour management of active medical problems listed below.  Physiatrist and rehab team continue to assess barriers to discharge/monitor patient progress toward functional and medical goals  Care Tool:  Bathing    Body  parts bathed by patient: Left arm, Chest, Abdomen, Front perineal area, Right upper leg, Left upper leg, Right lower leg, Left lower leg, Face, Buttocks, Right arm   Body parts bathed by helper: Right arm     Bathing assist Assist Level: Contact Guard/Touching assist     Upper Body Dressing/Undressing Upper body dressing   What is the patient wearing?: Pull over shirt    Upper body assist Assist Level: Supervision/Verbal cueing    Lower Body Dressing/Undressing Lower body dressing      What is the patient wearing?: Underwear/pull up, Pants     Lower body assist Assist for lower body dressing: Moderate Assistance - Patient 50 - 74%     Toileting Toileting    Toileting assist Assist for toileting: Minimal Assistance - Patient > 75% Assistive Device Comment: (pt used urinal)   Transfers Chair/bed transfer  Transfers assist     Chair/bed transfer assist level: Minimal Assistance - Patient > 75%     Locomotion Ambulation   Ambulation assist      Assist level: Minimal Assistance - Patient > 75% Assistive device: Walker-rolling Max distance: 75   Walk 10 feet activity   Assist  Walk 10 feet activity did not occur: Safety/medical concerns  Assist level: Minimal Assistance - Patient > 75% Assistive device: Walker-rolling   Walk 50 feet activity   Assist Walk 50 feet with 2 turns activity did not occur: Safety/medical concerns  Assist level: Minimal Assistance - Patient > 75% Assistive device: Walker-rolling    Walk 150 feet activity   Assist Walk 150 feet activity  did not occur: Safety/medical concerns  Assist level: Minimal Assistance - Patient > 75% Assistive device: Walker-rolling(L hand splint and L knee cage)    Walk 10 feet on uneven surface  activity   Assist Walk 10 feet on uneven surfaces activity did not occur: Safety/medical concerns         Wheelchair     Assist Will patient use wheelchair at discharge?: Yes Type of  Wheelchair: Manual    Wheelchair assist level: Minimal Assistance - Patient > 75%(RUE and LLE) Max wheelchair distance: 75    Wheelchair 50 feet with 2 turns activity    Assist        Assist Level: Minimal Assistance - Patient > 75%   Wheelchair 150 feet activity     Assist Wheelchair 150 feet activity did not occur: Safety/medical concerns   Assist Level: Independent      Medical Problem List and Plan: 1. Left-sided weakness secondary to right thalamic hemorrhage secondary to hypertensive crisis             Cont CIR 2. DVT Prophylaxis/Anticoagulation: Subcutaneous heparin initiated 03/14/2018. Monitor for any bleeding episodes 3. Pain Management: Tylenol as needed  Gabapentin 100 3 times daily started on 10/10-with improvement 4. Mood: Provide emotional support 5. Neuropsych: This patient is capable of making decisions on his own behalf. 6. Skin/Wound Care: Routine skin checks 7. Fluids/Electrolytes/Nutrition: Routine in and outs              BMP within acceptable range on 10/11  Labs ordered for tomorrow 8. PAF with RVR. Cardizem CD 180 mg daily, Lopressor 50 mg twice daily. Cardiac rate controlled. Follow-up cardiology services 9. Diabetes mellitus.Hemoglobin A1c 6.4. Glucophage 850 mg twice daily. Check blood sugars before meals and at bedtime.              CBG (last 3)  Recent Labs    04/07/18 1658 04/07/18 2129 04/08/18 0652  GLUCAP 77 86 110*    Controlled 10/17 10. Hyperlipidemia. Patient on Lipitor prior to admission 11.  Hypertension  Today's Vitals   04/07/18 2006 04/07/18 2140 04/08/18 0549 04/08/18 0740  BP: 134/75  133/90 114/79  Pulse: 73  73 77  Resp: 18   20  Temp: 98.2 F (36.8 C)  98.7 F (37.1 C)   TempSrc:   Oral   SpO2: 96%  98% 95%  Weight:      Height:      PainSc:  0-No pain     Body mass index is 31.15 kg/m.  Continue Lopressor  Controlled on 10/17  LOS: 22 days A FACE TO FACE EVALUATION WAS  PERFORMED  Ankit [name] 04/08/2018, 8:28 AM

## 2018-04-08 NOTE — Progress Notes (Addendum)
Occupational Therapy Weekly Progress Note  Patient Details  Name: Marcus Beasley MRN: [id number] Date of Birth: [birth year]  Beginning of progress report period: April 01, 2018 End of progress report period: April 08, 2018  Today's Date: 04/08/2018 OT Individual Time: 6226-3335 OT Individual Time Calculation (min): 90 min    Patient has met 4 of 4 short term goals.  Pt cont to make steady progress towards OT goals. He is developing functional use of L UE, with functional gross grasp and ROM at all major joints (some compensatory movements needed for full ROM). Requires min-mod cuing for initiation of use and attention to L UE during functional tasks. Decreased sensation and proprioceptive abilities impacting functional use. He is completed stand and squat pivot transfers at overall min A with min cuing for hand placement and technique.  Goals have been downgraded to min A overall due to pt's impaired functional standing balance and divided attention to task/balance required during ADL tasks.  CSW made aware of need to schedule hands on family education in prep for planned d/c home next week.   Patient continues to demonstrate the following deficits:abnormal posture, hemiplegia affecting non-dominant side and muscle weakness (generalized)  and therefore will continue to benefit from skilled OT intervention to enhance overall performance with BADL and Reduce care partner burden.  Patient not progressing toward long term goals.  See goal revision..  Plan of care revisions: Goals downgraded to min A overall. See POC for goal details. .  OT Short Term Goals Week 3:  OT Short Term Goal 1 (Week 3): Pt will don pants with mod A using AE PRN OT Short Term Goal 1 - Progress (Week 3): Met OT Short Term Goal 2 (Week 3): Pt will complete 1/3 toileting tasks with no more than mod steadying assist in order to decrease caregiver burden OT Short Term Goal 2 - Progress (Week 3): Met OT Short Term Goal 3  (Week 3): Pt will perform bathing tasks seated in supported chair with AE PRN and mod A in prep for d/c home for sponge bathing OT Short Term Goal 3 - Progress (Week 3): Met OT Short Term Goal 4 (Week 3): Pt will perform functional transfers with CGA to decrease caregiver burden  OT Short Term Goal 4 - Progress (Week 3): Met Week 4:  OT Short Term Goal 1 (Week 4): STG=LTG due to LOS  Skilled Therapeutic Interventions/Progress Updates:    Pt seen for OT session focusing on ADL re-training and neuro re-ed. Pt in supine upon arrival, denied pain and agreeable to tx session. He transferred to EOB from flat bread in simulation of home environment- completed with supervision.  He completed modified bathing task and full dressing task seated EOB, see below for assist levels. Pt displays much improved functional sitting balance during ADL tasks, maintianing dynamic balance during dressing task with supervision seated EOB.  Completed standing toilting task using RW and hand held urinal, pt able to maintain balance with B UE support during task, however, episode of LOB resulting in prompt return to sitting EOB when pt attempting to initiate self hygiene. Pt cont to display some impulsive behaviors with decreased safety awareness/ awareness of deficits.  He self propelled w/c to therapy gym using hemi technique with supervision. Seated EOM, completed fine motor task with emphasis on finger isolation and pincer grasp to pick up pop corn pieces. Pt displays poor proprioceptive abilities in both fine and gross grasping abilities.  Completed gross grasping reaching activity, reaching  to floor to retrieve items and then throwing item. Task in simulation of LB dressing task requiring activation and use of UE muscle groups simultaneously- increased time and trial required for success.  Completed bi-manual task, progressing to single grasp with L UE on weighted item while completing sit<>stand from EOB. Heavy reliance on  mat for standing balance/support, however, able to complete with CGA while maintaining grasp on object. Completed x10 sit<>stands with rest breaks throughout. Pt returned to room at end of session, left seated in w/c with all needs in reach.    Therapy Documentation Precautions:  Precautions Precautions: Fall Precaution Comments: L hemiparesis; strong L lateral lean in standing; pushing tendancies Restrictions Weight Bearing Restrictions: No Pain:   No/denies pain ADL: ADL Equipment Provided: Other (comment)(elastic shoe laces) Grooming: Setup Where Assessed-Grooming: Wheelchair Upper Body Bathing: Minimal assistance Where Assessed-Upper Body Bathing: Other (Comment)(Sitting on EOB) Lower Body Bathing: Minimal assistance Where Assessed-Lower Body Bathing: Other (Comment)(Sitting EOB) Upper Body Dressing: Supervision/safety Where Assessed-Upper Body Dressing: Wheelchair Lower Body Dressing: Minimal assistance Where Assessed-Lower Body Dressing: Wheelchair Toileting: Minimal assistance Where Assessed-Toileting: Other (Comment)(stanging with HH urinal) Toilet Transfer: Moderate assistance Toilet Transfer Method: Squat pivot Toilet Transfer Equipment: Raised toilet seat, Grab bars   Therapy/Group: Individual Therapy  Marcus Beasley L 04/08/2018, 7:44 AM

## 2018-04-08 NOTE — Progress Notes (Signed)
Occupational Therapy Session Note  Patient Details  Name: Marcus Beasley MRN: [id number] Date of Birth: [birth year]  Today's Date: 04/08/2018 OT Individual Time: 1300-1330 OT Individual Time Calculation (min): 30 min    Short Term Goals: Week 3:  OT Short Term Goal 1 (Week 3): Pt will don pants with mod A using AE PRN OT Short Term Goal 1 - Progress (Week 3): Met OT Short Term Goal 2 (Week 3): Pt will complete 1/3 toileting tasks with no more than mod steadying assist in order to decrease caregiver burden OT Short Term Goal 2 - Progress (Week 3): Met OT Short Term Goal 3 (Week 3): Pt will perform bathing tasks seated in supported chair with AE PRN and mod A in prep for d/c home for sponge bathing OT Short Term Goal 3 - Progress (Week 3): Met OT Short Term Goal 4 (Week 3): Pt will perform functional transfers with CGA to decrease caregiver burden  OT Short Term Goal 4 - Progress (Week 3): Met  Skilled Therapeutic Interventions/Progress Updates:    Pt resting in bed upon arrival and agreeable to therapy.  OT intervention with focus on bed mobility, sitting balance, LUE NMR, and safety awareness to increase independence with BADLs. Pt sat EOB at supervision level and engaged in LUE NMR with focus on isolated movement at elbow and shoulder.  Pt noted with decreased coordination/control with open chain tasks and required max verbal cues for sequencing of reaching and grasp/release tasks with cups and bean bags. Pt returned to bed and repositioned in bed at supervision level.  Pt remained in bed with all needs within reach and bed alarm activated.   Therapy Documentation Precautions:  Precautions Precautions: Fall Precaution Comments: L hemiparesis; strong L lateral lean in standing; pushing tendancies Restrictions Weight Bearing Restrictions: No   Pain:  Pt denies pain   Therapy/Group: Individual Therapy  Leroy Libman 04/08/2018, 2:46 PM

## 2018-04-08 NOTE — Progress Notes (Signed)
Physical Therapy Session Note  Patient Details  Name: Marcus Beasley MRN: [id number] Date of Birth: [birth year]  Today's Date: 04/08/2018 PT Individual Time: 1100-1200 PT Individual Time Calculation (min): 60 min   Short Term Goals: Week 3:  PT Short Term Goal 1 (Week 3): Pt will perform bed mobility S from flat bed PT Short Term Goal 2 (Week 3): Pt will perform squat pivot transfers consistent min guard R/L PT Short Term Goal 3 (Week 3): Pt will ambulate x25' modA x1 PT Short Term Goal 4 (Week 3): Pt will initiate stair training  Skilled Therapeutic Interventions/Progress Updates:    Pt received sitting in w/c; agreeable to PT services. CGA sit>stand in stedy for urination; Pt able to maintain upright/midline orientation with RUE support. Supervision w/c propulsion x 15' with RUE and LLE for LLE strengthening, coordination, and motor planning. CGA/MinA sit>stand throughout session. Today's session focused on weightbearing through the pt's affected side (UE and LE) for muscle activation/usage and reaching outside BOS for postural correction to midline. Attempted modified plantigrade weightbearing against wall and on raised bench, but unable to achieve weightbearing through LUE due to gravity dependent position/inability to actively elevate LUE. Modified plantigrade position on lowered mat table with RUE reaching for LUE weightbearing, L weightshifting, and midline control; also promoted and facilitated: LLE weightbearing, L quad activation, glute activation, core activation, and gastroc stretch; required heavy verbal/tactile cues to facilitate forward and left weightshifting, LLE/LUE extension to accept weight, and correction to midline; pt required frequent, short duration seated rest breaks. CGA sit>stand without RW. CGA/minA standing with reaching R/L/forward/across midline for postural control/correcting to midline; more difficulty with L reaching and crossing midline in either direction;  required minA to correct LOBs. TotalA w/c transport return to room. Pt remained seated in w/c with chair alarm intact, all needs in reach, and eating lunch.  Therapy Documentation Precautions:  Precautions Precautions: Fall Precaution Comments: L hemiparesis; strong L lateral lean in standing; pushing tendancies Restrictions Weight Bearing Restrictions: No    Therapy/Group: Individual Therapy  [place] Jayr Lupercio 04/08/2018, 12:52 PM

## 2018-04-08 NOTE — Progress Notes (Signed)
Social Work Patient ID: [name], male   DOB: 01/07/1946, 72 y.o.   MRN: [id number]  Spoke again with wife who reports they plan to take him home upon discharge and will all pull together as a family to provide his care. They just went through this with her sister and it did not end well at the facility. She will let me know when she will be here on Wed. But also does not want NH mentioned again to pt. She feels this will cause him too much stress and has no plans to do this anyway.

## 2018-04-09 ENCOUNTER — Inpatient Hospital Stay (HOSPITAL_COMMUNITY): Payer: Medicare Other | Admitting: Occupational Therapy

## 2018-04-09 ENCOUNTER — Ambulatory Visit (HOSPITAL_COMMUNITY): Payer: Medicare Other | Admitting: Occupational Therapy

## 2018-04-09 LAB — BASIC METABOLIC PANEL
Anion gap: 10 (ref 5–15)
BUN: 15 mg/dL (ref 8–23)
CO2: 24 mmol/L (ref 22–32)
Calcium: 9.8 mg/dL (ref 8.9–10.3)
Chloride: 102 mmol/L (ref 98–111)
Creatinine, Ser: 1 mg/dL (ref 0.61–1.24)
GFR calc Af Amer: >60 mL/min (ref >60)
GFR calc non Af Amer: >60 mL/min (ref >60)
Glucose, Bld: 101 mg/dL — ABNORMAL HIGH (ref 70–99)
Potassium: 4.1 mmol/L (ref 3.5–5.1)
Sodium: 136 mmol/L (ref 135–145)

## 2018-04-09 LAB — CBC WITH DIFFERENTIAL/PLATELET
Abs Immature Granulocytes: 0.03 10*3/uL (ref 0.00–0.07)
Basophils Absolute: 0 10*3/uL (ref 0.0–0.1)
Basophils Relative: 0 %
Eosinophils Absolute: 0.2 10*3/uL (ref 0.0–0.5)
Eosinophils Relative: 3 %
HCT: 43.6 % (ref 39.0–52.0)
Hemoglobin: 13.4 g/dL (ref 13.0–17.0)
Immature Granulocytes: 0 %
Lymphocytes Relative: 24 %
Lymphs Abs: 1.7 10*3/uL (ref 0.7–4.0)
MCH: 27 pg (ref 26.0–34.0)
MCHC: 30.7 g/dL (ref 30.0–36.0)
MCV: 87.7 fL (ref 80.0–100.0)
Monocytes Absolute: 0.7 10*3/uL (ref 0.1–1.0)
Monocytes Relative: 11 %
Neutro Abs: 4.3 10*3/uL (ref 1.7–7.7)
Neutrophils Relative %: 62 %
Platelets: 285 10*3/uL (ref 150–400)
RBC: 4.97 MIL/uL (ref 4.22–5.81)
RDW: 14.2 % (ref 11.5–15.5)
WBC: 6.9 10*3/uL (ref 4.0–10.5)
nRBC: 0 % (ref 0.0–0.2)

## 2018-04-09 LAB — GLUCOSE, CAPILLARY
Glucose-Capillary: 99 mg/dL (ref 70–99)
Glucose-Capillary: 131 mg/dL — ABNORMAL HIGH (ref 70–99)
Glucose-Capillary: 82 mg/dL (ref 70–99)
Glucose-Capillary: 80 mg/dL (ref 70–99)

## 2018-04-09 NOTE — Progress Notes (Signed)
Occupational Therapy Session Note  Patient Details  Name: Marcus Beasley MRN: [id number] Date of Birth: [birth year]  Today's Date: 04/09/2018 OT Individual Time: 0845-1000 and 11:15-12:00 OT Individual Time Calculation (min): 75 min and 45 min   Short Term Goals: Week 4:  OT Short Term Goal 1 (Week 4): STG=LTG due to LOS  Skilled Therapeutic Interventions/Progress Updates:    Session One: Pt seen for OT ADL bathing/dressing session. Pt in supine upon arrival, denying pain and agreeable to tx session. Toileting, bathing, and dressing tasks completed seated on padded tub bench with cut out- trialing for possible use at d/c as pt's bathroom will not be accessible to him. He completed squat pivot transfer to padded bench with min A, min cuing for sequencing/ technique. See below for assist levels in ADL tasks. With min A and instructional cuing, completed lateral leans in order to complete buttock hygiene. Pt able to maintain dynamic sitting balance during dressing tasks with supervision (feet supported throughout with back support). Pt independently initiated and used L UE to bathe R UE, able to maintain grasp on washcloth and full ROM to wash entire R UE. He stood at [facility] to pull pants up, requiring mod cuing for midline orientation and mod physical assist while pt attempting to advance underwear/pants over hips.  He returned to w/c to complete grooming tasks from w/c level at sink mod I. During dressing and grooming tasks, pt required to obtain items with L hand, requiring increased time and trials to complete. Noted segmental, apraxic and ataxic movements for all gross and fine motor movements with L UE. Recommended pt verbally direct movements of L UE to assist with motor planning. Pt left seated in w/c at end of session, all needs in reach.  Throughout session, pt educated regarding recommended DME, therapy goals, reducing caregiver burden, and d/c planning.   Session Two: Pt seen for OT  session focusing on neuro re-ed with L UE and functional mobility. Pt sitting up in w/c upon arrival, requesting toileting task. Discussed with pt plans for voiding urine at home- increased independence with hand held urinal vs standing with assist. Pt demonstrates ability to manage clothing and urinal with set-up from w/c position to complete task.  Self propelled w/c throughout unit supervision using hemi-technique. Neuro re-ed seated EOM with use of yellow and red clothes pins (least resistance), unable to manipulate higher resistance clothes pins. Placed along rim at midline, min A for normalized movement patterns for shoulder and elbow extension.  Completed lateral scoots along mat with HHA for use of L UE to assist with movements.  In side-lying position, completed shoulder flexion, min A and mod cuing for full ROM. Pt initially showing flexor synergy patterns with movements, with cuing able to relax and break away from pattern. Pt returned to w/c at end of session, returned to room and left sitting in w/c set-up with meal tray, encouraged to stay sitting up until finished with meal.   Therapy Documentation Precautions:  Precautions Precautions: Fall Precaution Comments: L hemiparesis; strong L lateral lean in standing; pushing tendancies Restrictions Weight Bearing Restrictions: No Pain: Pain Assessment Pain Score: No/denies pain ADL: ADL Equipment Provided: Other (comment)(elastic shoe laces) Grooming: Setup Where Assessed-Grooming: Wheelchair Upper Body Bathing: Supervision/safety Where Assessed-Upper Body Bathing: Other (Comment) Lower Body Bathing: Minimal assistance Where Assessed-Lower Body Bathing: Other (Comment)(Padded tub bench with cutout) Upper Body Dressing: Supervision/safety Where Assessed-Upper Body Dressing: Other (Comment)(Padded tub bench) Lower Body Dressing: Minimal assistance Where Assessed-Lower Body Dressing: Other (  Comment)(Padded tub bench with  cutout) Toileting: Minimal assistance Where Assessed-Toileting: Other (Comment)(stanging with HH urinal) Toilet Transfer: Minimal assistance Toilet Transfer Method: Squat pivot Toilet Transfer Equipment: Other (comment)(Padded tub bench with cutout)    Therapy/Group: Individual Therapy  Miri Jose L 04/09/2018, 7:40 AM

## 2018-04-09 NOTE — Plan of Care (Signed)
  Problem: RH SAFETY Goal: RH STG ADHERE TO SAFETY PRECAUTIONS W/ASSISTANCE/DEVICE Description STG Adhere to Safety Precautions With Assistance/Device. Mod I  Outcome: Progressing  Call light within reach, bed/chair alarm, proper footwear

## 2018-04-09 NOTE — Progress Notes (Signed)
Occupational Therapy Session Note  Patient Details  Name: BOLUWATIFE FLIGHT MRN: [id number] Date of Birth: 31-Jan-1946  Today's Date: 04/09/2018 OT Co-Treatment Time:  1345-1430; 1300-1430 (total time)  OT Co-Treatment Time Calculation (min): 45 min   Short Term Goals: Week 4:  OT Short Term Goal 1 (Week 4): STG=LTG due to LOS  Skilled Therapeutic Interventions/Progress Updates:    Pt sitting in w/c upon entry with no reports of pain and eager to begin co-treatment session. Pt performed functional transfers with CGA-min A this session with/without RW throughout to challenge dynamic standing balance and increase LE strength. Pt propelled self with alternating BLE's to therapy gym with supervision. Pt participated in various dynamic standing balance tasks with focus maintaining midline, weight shift L <> R and obtaining/maintaining upright standing position despite challenge of functional reaching tasks alternating L and R UE use. Multiple seated rest breaks required throughout session after ~3-5 mins activity. Pt required mod A at times during LOB however pt able to self correct LOB to L with mod VC's for extension of LLE and weight shift- pt then able to obtain standing position with CGA-min A from therapist. In standing, pt encouraged to reach laterally, mid level and high to address isolation of LUE movements with hard and soft cups with pt demonstrating decreased ability to weight shift and maintain balance when reaching laterally to the L due to poor awareness. Pt with improved ability to isolate elbow extension specifically when verbalizing movements he wants LUE to make as well as improvements in extension when instructed to obtain extension prior to extending digits to grasp items.  Seated in w/c pt performed repetitions of reaching toward ball on floor to facilitate elbow extension and shoulder flexion and then coordinating R UE for bimanual task of bringing ball back to chest level. Pt req HOH  A at times to maintain L hand positioning on ball however pt able to quickly correct movements and then complete task. Pt returned to w/c for seated rest break and then performed dynamic standing task in simulation of pulling pants up/down alternating R and L UE use with opposite hand on RW for UE support. Pt able to complete with min A for balance and min VC's for facilitating weight shift into R UE in order to maintain upright positioning with improvements demonstrated this session in assist level needed by therapist. Pt returned to w/c and transported back to room total A for time mgmt. Pt transferred into bed and left supine in bed with bed alarm on and all 4 rails up due to pt request of feeling safety in bed.    Therapy Documentation Precautions:  Precautions Precautions: Fall Precaution Comments: L hemiparesis; strong L lateral lean in standing; pushing tendancies Restrictions Weight Bearing Restrictions: No   Therapy/Group: Co-Treatment  Juel Ripley 04/09/2018, 2:53 PM

## 2018-04-09 NOTE — Progress Notes (Signed)
Physical Therapy Session Note  Patient Details  Name: Marcus Beasley MRN: [id number] Date of Birth: 1946-04-08  Today's Date: 04/09/2018 PT Co-Treatment Time: 1300-1345 PT Co-Treatment Time Calculation (min): 45 min  Skilled Therapeutic Interventions/Progress Updates:    Pt received sitting in w/c and agreeable to treatment. Skilled co-treat with OT for focus on functional tasks while simultaneously working on weightshifting and postural control. S w/c propulsion x 100' with RUE and both LEs to improve LE coordination. CGA squat pivot transfers throughout session unless otherwise stated. Standing reaching to R/anterior/L outside BOS for pt acknowledgment of "tipping point"/knowing when to correct posture. Seated rest breaks throughout. CGA sit>stand from raised mat table without UE support. Task was altered to make it more functional by reaching for cups; pt exhibited better righting reactions for postural control; pt required verbal and tactile cues to appropriately shift weight for equal weightbearing through both extremities and from side to side to prevent self from "tipping over." Dynamic seated balance was challenged by reaching for object on the floor (anteriorly and to L) lifting it overhead, to the floor on the R, and back to the floor in front or L to strengthen postural muscles. Dynamic standing balance with "pulling pants up" (theraband) with RW; focus on weightshifting and moving UEs while maintaining balance. CGA sit>stand with RW and L hand splint. MinA gait x 30' with RW and L hand splint; pt exhibited more awareness with foot placement and the need to weightshift to R for LLE clearance; ModA for LOBs with turns. Pt returned to room via [place] w/c transport and remained in bed with four rails up (per pt request), bed alarm intact, and all needs in reach.   Therapy Documentation Precautions:  Precautions Precautions: Fall Precaution Comments: L hemiparesis; strong L lateral lean in  standing; pushing tendancies Restrictions Weight Bearing Restrictions: No Vital Signs: Therapy Vitals Temp: 99 F (37.2 C) Temp Source: Oral Pulse Rate: 77 Resp: 14 BP: 118/81 Patient Position (if appropriate): Lying Oxygen Therapy SpO2: 97 % O2 Device: Room Air   Therapy/Group: Co-Treatment  [name] 04/09/2018, 3:12 PM

## 2018-04-09 NOTE — Progress Notes (Signed)
[place] PHYSICAL MEDICINE & REHABILITATION PROGRESS NOTE  Subjective/Complaints: Patient seen lying in bed this morning.  He states he slept well overnight.  He is able to lift his left upper extremity even higher.  ROS: Denies CP, SOB, nausea, vomiting, diarrhea.  Objective: Vital Signs: Blood pressure 128/89, pulse 70, temperature 98.3 F (36.8 C), temperature source Oral, resp. rate 18, height 5\' 11"  (1.803 m), weight 101.3 kg, SpO2 96 %. No results found. Recent Labs    04/09/18 0449  WBC 6.9  HGB 13.4  HCT 43.6  PLT 285   Recent Labs    04/09/18 0449  NA 136  K 4.1  CL 102  CO2 24  GLUCOSE 101*  BUN 15  CREATININE 1.00  CALCIUM 9.8    Physical Exam: BP 128/89 (BP Location: Right Arm)   Pulse 70   Temp 98.3 F (36.8 C) (Oral)   Resp 18   Ht 5\' 11"  (1.803 m)   Wt 101.3 kg   SpO2 96%   BMI 31.15 kg/m  Constitutional: No distress . Vital signs reviewed. HENT: Normocephalic.  Atraumatic. Eyes: EOMI. No discharge. Cardiovascular:  RRR.  No JVD. Respiratory: CTA  bilaterally.  Normal effort. GI: BS +. Non-distended. Musc: No edema or tenderness in extremities. Neurological: He is alert and oriented Follows basic commands Motor:  LUE: Shoulder abduction 3/5, elbow flex 4-/5, elbow extension 3+/5, hand grip 4-4+/5,  LLE: HF, KE, ADF 4/5,  improving No increase in tone Left facial droop Skin: Skin is warm and dry.  Psychiatric: He has a normal mood and affect. His behavior is normal. Thought content normal.   Assessment/Plan: 1. Functional deficits secondary to right thalamic hemorrhage which require 3+ hours per day of interdisciplinary therapy in a comprehensive inpatient rehab setting.  Physiatrist is providing close team supervision and 24 hour management of active medical problems listed below.  Physiatrist and rehab team continue to assess barriers to discharge/monitor patient progress toward functional and medical goals  Care  Tool:  Bathing    Body parts bathed by patient: Left arm, Chest, Abdomen, Front perineal area, Right upper leg, Left upper leg, Right lower leg, Left lower leg, Face, Buttocks, Right arm   Body parts bathed by helper: Right arm     Bathing assist Assist Level: Contact Guard/Touching assist     Upper Body Dressing/Undressing Upper body dressing   What is the patient wearing?: Pull over shirt    Upper body assist Assist Level: Supervision/Verbal cueing    Lower Body Dressing/Undressing Lower body dressing      What is the patient wearing?: Underwear/pull up, Pants     Lower body assist Assist for lower body dressing: Minimal Assistance - Patient > 75%     Toileting Toileting    Toileting assist Assist for toileting: Minimal Assistance - Patient > 75%(Using urinal) Assistive Device Comment: (pt used urinal)   Transfers Chair/bed transfer  Transfers assist     Chair/bed transfer assist level: Minimal Assistance - Patient > 75%     Locomotion Ambulation   Ambulation assist      Assist level: Minimal Assistance - Patient > 75% Assistive device: Walker-rolling Max distance: 50   Walk 10 feet activity   Assist  Walk 10 feet activity did not occur: Safety/medical concerns  Assist level: Minimal Assistance - Patient > 75% Assistive device: Walker-rolling   Walk 50 feet activity   Assist Walk 50 feet with 2 turns activity did not occur: Safety/medical concerns  Assist level:  Minimal Assistance - Patient > 75% Assistive device: Walker-rolling    Walk 150 feet activity   Assist Walk 150 feet activity did not occur: Safety/medical concerns  Assist level: Minimal Assistance - Patient > 75% Assistive device: Walker-rolling(L hand splint and L knee cage)    Walk 10 feet on uneven surface  activity   Assist Walk 10 feet on uneven surfaces activity did not occur: Safety/medical concerns         Wheelchair     Assist Will patient use  wheelchair at discharge?: Yes Type of Wheelchair: Manual    Wheelchair assist level: Supervision/Verbal cueing Max wheelchair distance: 15    Wheelchair 50 feet with 2 turns activity    Assist        Assist Level: Minimal Assistance - Patient > 75%   Wheelchair 150 feet activity     Assist Wheelchair 150 feet activity did not occur: Safety/medical concerns   Assist Level: Independent      Medical Problem List and Plan: 1. Left-sided weakness secondary to right thalamic hemorrhage secondary to hypertensive crisis             Cont CIR 2. DVT Prophylaxis/Anticoagulation: Subcutaneous heparin initiated 03/14/2018. Monitor for any bleeding episodes 3. Pain Management: Tylenol as needed  Gabapentin 100 3 times daily started on 10/10-with improvement 4. Mood: Provide emotional support 5. Neuropsych: This patient is capable of making decisions on his own behalf. 6. Skin/Wound Care: Routine skin checks 7. Fluids/Electrolytes/Nutrition: Routine in and outs              BMP within acceptable range on 10/18  8. PAF with RVR. Cardizem CD 180 mg daily, Lopressor 50 mg twice daily. Cardiac rate controlled. Follow-up cardiology services 9. Diabetes mellitus.Hemoglobin A1c 6.4. Glucophage 850 mg twice daily. Check blood sugars before meals and at bedtime.              CBG (last 3)  Recent Labs    04/08/18 1659 04/08/18 2141 04/09/18 0647  GLUCAP 98 99 99    Controlled 10/18 10. Hyperlipidemia. Patient on Lipitor prior to admission 11.  Hypertension  Today's Vitals   04/09/18 0500 04/09/18 0835 04/09/18 0837 04/09/18 0837  BP: 121/90 135/81 128/89 128/89  Pulse: 63 73 66 70  Resp: 12 18  18   Temp: 98.3 F (36.8 C)     TempSrc: Oral     SpO2: 95% 95%  96%  Weight:      Height:      PainSc:       Body mass index is 31.15 kg/m.  Continue Lopressor  Controlled on 10/18  LOS: 23 days A FACE TO FACE EVALUATION WAS PERFORMED  Ankit [name] 04/09/2018, 9:20 AM

## 2018-04-10 ENCOUNTER — Inpatient Hospital Stay (HOSPITAL_COMMUNITY): Payer: Medicare Other | Admitting: Physical Therapy

## 2018-04-10 LAB — GLUCOSE, CAPILLARY
Glucose-Capillary: 97 mg/dL (ref 70–99)
Glucose-Capillary: 96 mg/dL (ref 70–99)
Glucose-Capillary: 98 mg/dL (ref 70–99)
Glucose-Capillary: 82 mg/dL (ref 70–99)

## 2018-04-10 NOTE — Progress Notes (Signed)
Physical Therapy Session Note  Patient Details  Name: Marcus Beasley MRN: [id number] Date of Birth: [birth year]  Today's Date: 04/10/2018 PT Individual Time: 0915-1000 PT Individual Time Calculation (min): 45 min   Short Term Goals: Week 4:  PT Short Term Goal 1 (Week 4): =LTG due to estimated LOS  Skilled Therapeutic Interventions/Progress Updates:    Pt received seated in w/c in room, agreeable to PT. No complaints of pain. Manual w/c propulsion x 150 ft with R UE/LE and Supervision. Squat pivot transfer w/c to/from mat table L and R with CGA. Sit to stand x 5 reps from mat table to RW with CGA to min A, focus on LLE control with transfer. Sit to stand x 5 reps from mat table to RW with min A and 3" step under RLE to encourage weight shift onto LLE with transfer. Standing mini-squats x 10 reps with manual cues through LLE for quad control with one UE support on rail and min A for balance. Side-steps x 15 ft L/R with one UE support on rail and mod A for balance, focus on LLE control. Standing balance in stedy while using urinal, min A for balance and clothing management. Sit to supine with Supervision. Pt left semi-reclined in bed with needs in reach at end of therapy session, bed alarm in place.  Therapy Documentation Precautions:  Precautions Precautions: Fall Precaution Comments: L hemiparesis; strong L lateral lean in standing; pushing tendancies Restrictions Weight Bearing Restrictions: No   Therapy/Group: Individual Therapy  Excell Seltzer, PT, DPT  04/10/2018, 12:13 PM

## 2018-04-10 NOTE — Progress Notes (Addendum)
Subjective: Patient feels well.  He has no compla ints.  He slept well.  Appetite is good.  Objective:BP (!) 160/61 (BP Location: Right Arm)   Pulse 65   Temp 98.1 F (36.7 C) (Oral)   Resp 16   Ht 5\' 11"  (1.803 m)   Wt 101.3 kg   SpO2 99%   BMI 31.15 kg/m  Elderly male in no acute distress.  HEENT exam atraumatic, normocephalic, extraocular muscles are intact. Neck is supple. Chest clear to auscultation Cardiac exam S1-S2 IRregular Abdominal exam active bowel sounds, soft Extremities without significant edema He does have a brace on his left foot.  Assessment and plan 1.  Functional deficits secondary to right thalamic hemorrhage 2.  DVT prophylaxis is adequate 3.  Pain management: No current complaints of pain 4.  Paroxysmal atrial fibrillation currently in AFIB by exam.  He is on medications for rate control. 5.  Type 2 diabetes CBG (last 3)  Recent Labs    04/09/18 1637 04/09/18 2140 04/10/18 0638  GLUCAP 82 80 97  Well-controlled. We will monitor.  Blood pressure somewhat elevated today but has been well controlled.

## 2018-04-11 ENCOUNTER — Inpatient Hospital Stay (HOSPITAL_COMMUNITY): Payer: Medicare Other | Admitting: Physical Therapy

## 2018-04-11 LAB — GLUCOSE, CAPILLARY
Glucose-Capillary: 97 mg/dL (ref 70–99)
Glucose-Capillary: 105 mg/dL — ABNORMAL HIGH (ref 70–99)
Glucose-Capillary: 101 mg/dL — ABNORMAL HIGH (ref 70–99)
Glucose-Capillary: 101 mg/dL — ABNORMAL HIGH (ref 70–99)

## 2018-04-11 NOTE — Progress Notes (Signed)
Physical Therapy Session Note  Patient Details  Name: Marcus Beasley MRN: [id number] Date of Birth: February 10, 1946  Today's Date: 04/11/2018 PT Individual Time: 0900-1000 PT Individual Time Calculation (min): 60 min   Short Term Goals: Week 4:  PT Short Term Goal 1 (Week 4): =LTG due to estimated LOS  Skilled Therapeutic Interventions/Progress Updates:    Patient received up in [facility], pleasant and willing to work with skilled PT services. Able to self-propel WC distances of >233f with hemi-technique and distant S, [facility]. Focused session on strengthening exercises for L LE with cues and tactile feedback for appropriate form and technique, including bridges, SLRs, SAQS, and sit to stand with heavy cues for weight shift to L side. Introduced Nustep on level 3 with LEs only, cues for equal activation of B LEs while on machine. Wife educated regarding assistance needed for bed mobility and functional transfers this session. He was fatigued at EOS and was left in bed with all needs met, bed alarm active, wife present this morning.   Therapy Documentation Precautions:  Precautions Precautions: Fall Precaution Comments: L hemiparesis; strong L lateral lean in standing; pushing tendancies Restrictions Weight Bearing Restrictions: No General:   Vital Signs:   Pain: Pain Assessment Pain Scale: 0-10 Pain Score: 0-No pain Pain Type: Acute pain Pain Location: Arm Pain Orientation: Left Pain Descriptors / Indicators: Aching Pain Frequency: Intermittent Pain Onset: On-going Pain Intervention(s): Medication (See eMAR)    Therapy/Group: Individual Therapy  KDeniece ReePT, DPT, CBIS  Supplemental Physical Therapist [facility]   Pager [phone] Rehab Office [phone]  04/11/2018, 12:06 PM

## 2018-04-12 ENCOUNTER — Inpatient Hospital Stay (HOSPITAL_COMMUNITY): Payer: Medicare Other | Admitting: Occupational Therapy

## 2018-04-12 ENCOUNTER — Inpatient Hospital Stay (HOSPITAL_COMMUNITY): Payer: Medicare Other

## 2018-04-12 ENCOUNTER — Inpatient Hospital Stay (HOSPITAL_COMMUNITY): Payer: Medicare Other | Admitting: Physical Therapy

## 2018-04-12 LAB — GLUCOSE, CAPILLARY
Glucose-Capillary: 109 mg/dL — ABNORMAL HIGH (ref 70–99)
Glucose-Capillary: 105 mg/dL — ABNORMAL HIGH (ref 70–99)
Glucose-Capillary: 111 mg/dL — ABNORMAL HIGH (ref 70–99)
Glucose-Capillary: 100 mg/dL — ABNORMAL HIGH (ref 70–99)

## 2018-04-12 NOTE — Progress Notes (Signed)
Social Work Patient ID: Marcus Beasley, male   DOB: 1945-10-20, 72 y.o.   MRN: [id number] Spoke with wife who reports she can be here Wed only form 10-11. She feels she can go through PT and get what she ends. She reports their granddaughter who is a Therapist, sports has been here and can see what he needs. Encouraged wife to come in for half a day to see all of his therapies, but she does not fel this is possible. Do not feel his wife is aware of his care needs and the amount of care he will require at  Discharge. Have made referral to [facility] their preference and have explained the amount of time they will be there. Continue to work on discharge needs.

## 2018-04-12 NOTE — Progress Notes (Signed)
Physical Therapy Session Note  Patient Details  Name: Marcus Beasley MRN: [id number] Date of Birth: 1946/02/13  Today's Date: 04/12/2018 PT Individual Time: 1045-1200 PT Individual Time Calculation (min): 75 min   Short Term Goals: Week 4:  PT Short Term Goal 1 (Week 4): =LTG due to estimated LOS  Skilled Therapeutic Interventions/Progress Updates:    Pt received seated in w/c in room, agreeable to PT. Pt reports some ongoing soreness in LUE due to working it out with OT this AM, reports relief of soreness with muscle rub. Manual w/c propulsion 2 x 150 ft with R UE/LE and Supervision. Squat pivot transfer L and R w/c to/from mat table with CGA. Ambulation 2 x 22 ft with RW with L hand splint and L knee cage with min A, v/c for pushing down with RUE and manual cues to encourage weight shift during gait. Pt exhibits ongoing scissoring and LLE adductor tone during gait. Forward/backward amb 2 x 10 ft with RW with L hand splint and L knee cage with mod A for balance and weight shift L/R as well as mod v/c for safety and sequencing. Side-steps L/R 2 x 10 ft each with RW with L hand splint and L knee cage with min A for balance and mod v/c for sequencing. Pt fatigues quickly with standing activities. Sit to stand 2 x 5 reps to RW with CGA to min A from progressively lower surface, increase in assist needed with decrease in surface height. Car transfer with min A, v/c for sequencing and safe transfer technique. Pt left seated in w/c in room with barber present to cut his hair, needs in reach.  Therapy Documentation Precautions:  Precautions Precautions: Fall Precaution Comments: L hemiparesis; strong L lateral lean in standing; pushing tendancies Restrictions Weight Bearing Restrictions: No   Therapy/Group: Individual Therapy  Excell Seltzer, PT, DPT  04/12/2018, 12:12 PM

## 2018-04-12 NOTE — Progress Notes (Signed)
[facility] PHYSICAL MEDICINE & REHABILITATION PROGRESS NOTE  Subjective/Complaints: Patient seen sitting up in bed this morning.  He states he slept well overnight.  He states he had a good weekend.  ROS: Denies CP, SOB, nausea, vomiting, diarrhea.  Objective: Vital Signs: Blood pressure 126/82, pulse 71, temperature 98.5 F (36.9 C), temperature source Oral, resp. rate 16, height 5\' 11"  (1.803 m), weight 101.3 kg, SpO2 98 %. No results found. No results for input(s): WBC, HGB, HCT, PLT in the last 72 hours. No results for input(s): NA, K, CL, CO2, GLUCOSE, BUN, CREATININE, CALCIUM in the last 72 hours.  Physical Exam: BP 126/82   Pulse 71   Temp 98.5 F (36.9 C) (Oral)   Resp 16   Ht 5\' 11"  (1.803 m)   Wt 101.3 kg   SpO2 98%   BMI 31.15 kg/m  Constitutional: No distress . Vital signs reviewed. HENT: Normocephalic.  Atraumatic. Eyes: EOMI. No discharge. Cardiovascular:  RRR.  No JVD. Respiratory: CTA  bilaterally.  Normal effort. GI: BS +. Non-distended. Musc: No edema or tenderness in extremities. Neurological: He is alert and oriented Follows basic commands Motor:  LUE: Shoulder abduction 3/5, elbow flex 4-/5, elbow extension 3+/5, hand grip 4-4+/5, improving LLE: HF, KE, ADF 4/5,  improving No increase in tone Left facial droop Skin: Skin is warm and dry.  Psychiatric: He has a normal mood and affect. His behavior is normal. Thought content normal.   Assessment/Plan: 1. Functional deficits secondary to right thalamic hemorrhage which require 3+ hours per day of interdisciplinary therapy in a comprehensive inpatient rehab setting.  Physiatrist is providing close team supervision and 24 hour management of active medical problems listed below.  Physiatrist and rehab team continue to assess barriers to discharge/monitor patient progress toward functional and medical goals  Care Tool:  Bathing    Body parts bathed by patient: Left arm, Chest, Abdomen, Front  perineal area, Right upper leg, Left upper leg, Right lower leg, Left lower leg, Face, Buttocks, Right arm   Body parts bathed by helper: Right arm     Bathing assist Assist Level: Contact Guard/Touching assist     Upper Body Dressing/Undressing Upper body dressing   What is the patient wearing?: Pull over shirt    Upper body assist Assist Level: Supervision/Verbal cueing    Lower Body Dressing/Undressing Lower body dressing      What is the patient wearing?: Underwear/pull up, Pants     Lower body assist Assist for lower body dressing: Minimal Assistance - Patient > 75%     Toileting Toileting    Toileting assist Assist for toileting: Independent with assistive device Assistive Device Comment: urinal   Transfers Chair/bed transfer  Transfers assist     Chair/bed transfer assist level: Contact Guard/Touching assist     Locomotion Ambulation   Ambulation assist      Assist level: Minimal Assistance - Patient > 75% Assistive device: Walker-rolling(L hand splint) Max distance: 30   Walk 10 feet activity   Assist  Walk 10 feet activity did not occur: Safety/medical concerns  Assist level: Minimal Assistance - Patient > 75% Assistive device: Walker-rolling   Walk 50 feet activity   Assist Walk 50 feet with 2 turns activity did not occur: Safety/medical concerns  Assist level: Minimal Assistance - Patient > 75% Assistive device: Walker-rolling    Walk 150 feet activity   Assist Walk 150 feet activity did not occur: Safety/medical concerns  Assist level: Minimal Assistance - Patient > 75%  Assistive device: Walker-rolling(L hand splint and L knee cage)    Walk 10 feet on uneven surface  activity   Assist Walk 10 feet on uneven surfaces activity did not occur: Safety/medical concerns         Wheelchair     Assist Will patient use wheelchair at discharge?: Yes Type of Wheelchair: Manual    Wheelchair assist level:  Supervision/Verbal cueing Max wheelchair distance: 250    Wheelchair 50 feet with 2 turns activity    Assist        Assist Level: Supervision/Verbal cueing   Wheelchair 150 feet activity     Assist Wheelchair 150 feet activity did not occur: Safety/medical concerns   Assist Level: Supervision/Verbal cueing      Medical Problem List and Plan: 1. Left-sided weakness secondary to right thalamic hemorrhage secondary to hypertensive crisis             Cont CIR 2. DVT Prophylaxis/Anticoagulation: Subcutaneous heparin initiated 03/14/2018. Monitor for any bleeding episodes 3. Pain Management: Tylenol as needed  Gabapentin 100 3 times daily started on 10/10-with improvement 4. Mood: Provide emotional support 5. Neuropsych: This patient is capable of making decisions on his own behalf. 6. Skin/Wound Care: Routine skin checks 7. Fluids/Electrolytes/Nutrition: Routine in and outs              BMP within acceptable range on 10/18  8. PAF with RVR. Cardizem CD 180 mg daily, Lopressor 50 mg twice daily. Cardiac rate controlled. Follow-up cardiology services  Controlled on 10/21 9. Diabetes mellitus.Hemoglobin A1c 6.4. Glucophage 850 mg twice daily. Check blood sugars before meals and at bedtime.              CBG (last 3)  Recent Labs    04/11/18 1643 04/11/18 2100 04/12/18 0615  GLUCAP 101* 101* 109*    Controlled 10/21 10. Hyperlipidemia. Patient on Lipitor prior to admission 11.  Hypertension  Today's Vitals   04/11/18 1953 04/12/18 0503 04/12/18 0807 04/12/18 0808  BP: 113/75 115/84 126/62 126/82  Pulse: 70 66  71  Resp: 16 16    Temp: 98.5 F (36.9 C)     TempSrc: Oral     SpO2: 98% 98%    Weight:      Height:      PainSc:       Body mass index is 31.15 kg/m.  Continue Lopressor  Controlled on 10/21  LOS: 26 days A FACE TO FACE EVALUATION WAS PERFORMED  Ankit [name] 04/12/2018, 8:42 AM

## 2018-04-12 NOTE — Progress Notes (Signed)
Occupational Therapy Session Note  Patient Details  Name: Marcus Beasley MRN: [id number] Date of Birth: 1945-08-17  Today's Date: 04/12/2018 OT Individual Time: 0900-1030 OT Individual Time Calculation (min): 90 min    Short Term Goals: Week 4:  OT Short Term Goal 1 (Week 4): STG=LTG due to LOS  Skilled Therapeutic Interventions/Progress Updates:    Pt supine in bed upon entry with no reports of pain and eager to begin therapy session. Pt performed squat and stand pivot transfers this session with close supervision-CGA dependent on surface. Pt transferred to TTB and performed bathing and dressing tasks while seated using lateral leans for periarea hygiene. Standing at RW pt able to pull underwear/pants up alternating UE support on RW with min A req from therapist for balance. Discussed with pt recommendation for wife to assist with pulling pants up at home in standing with pt's focus on maintaining balance for decreased risk of falls with verbal comprehension from pt. Pt with left side forward LOB noted while seated on TTB req assistance from therapist to maintain balance with pt then able to use RUE to obtain upright sitting position. Pt then transferred to w/c and performed grooming tasks at sink with min VC's at time from therapist for utilization of LUE for tasks. Pt then performed toilet transfer x2 to assess safety plan for nursing staff- recommendation for staff to perform stand pivot transfer with min A for safety and balance with use of grab bars. Safety plan updated and nursing staff made aware of changes.  While seated in w/c, pt participated in LUE NMR with focus on normalizing movement patterns and reducing compensatory movements. Pt performed tennis ball tasks including NMR of shoulder flexion, elbow extension, and grasp/release in various planes. Pt then performed tasks with LUE and then bilaterally by placing ball on ground and bringing back to lap in prep for increased balance and  control for donning/doffing socks. Lastly, therapist performed [facility] assist with facilitation at elbow in order to decrease segmental movements by pt and normalize patterns with shoulder flexion, shoulder abduction and elbow extension. Pt left seated in w/c in room with all needs in reach.   Therapy Documentation Precautions:  Precautions Precautions: Fall Precaution Comments: L hemiparesis; strong L lateral lean in standing; pushing tendancies Restrictions Weight Bearing Restrictions: No   ADL ADL Equipment Provided: Other (comment)(elastic shoe laces) Grooming: Setup Where Assessed-Grooming: Wheelchair, Sitting at sink Upper Body Bathing: Supervision/safety Where Assessed-Upper Body Bathing: (TTB at bedside ) Lower Body Bathing: Minimal assistance Where Assessed-Lower Body Bathing: Other (TTB at bedside ) Upper Body Dressing: Supervision/safety Where Assessed-Upper Body Dressing: (TTB) Lower Body Dressing: Minimal assistance Where Assessed-Lower Body Dressing: (TTB) Toileting: Minimal Assistance  Where Assessed-Toileting: Glass blower/designer: Minimal assistance, Minimal verbal cueing Toilet Transfer Method: Stand pivot    Therapy/Group: Individual Therapy  Zaylon Bossier 04/12/2018, 10:34 AM

## 2018-04-12 NOTE — Progress Notes (Signed)
Physical Therapy Session Note  Patient Details  Name: Marcus Beasley MRN: [id number] Date of Birth: [birth year]  Today's Date: 04/12/2018 PT Individual Time: 1415-1510 PT Individual Time Calculation (min): 55 min   Short Term Goals:  Week 4:  PT Short Term Goal 1 (Week 4): =LTG due to estimated LOS  Skilled Therapeutic Interventions/Progress Updates:  Pt resting in bed.  PT donned bil TEDs  Bed mobility in flat bed without rails, supervision without cues.  neuromuscular re-education via demo, multimodal cues for L knee stance control in standing with bil UE support on RW; pt limited by poor position sense L knee.  Seated LLE for 10 x 1 isolated L knee flexion/extension, L hip abduction/adduction, and L ankle DF/PF,  eventually alternating with R ankle.  Pt reported he needed to use urinal; sit > stand at RW.  Pt used urinal continently with min assist for clothing and balance, cues to avoid drifting to L.   PT donned L knee cage to limit hyperextension.  Sit>< stand blocked practice, without UE support, x 7 focusing on midline orientation, loading LLE, position of L foot below knee and preventing pushing with R hand, with min assist/min guard assist 5/7 trials.  Gait training with RW on level tile, x 40' including turns, max assist for cueing for shorter step L, keeping RW far enough ahead of his feet for safety, upright trunk.    Pt left resting in w/c with needs at hand; seat alarm set and L knee cage doffed.     Therapy Documentation Precautions:  Precautions Precautions: Fall Precaution Comments: L hemiparesis; strong L lateral lean in standing; pushing tendancies Restrictions Weight Bearing Restrictions: No   Pain: Pain Assessment Pain Scale: 0-10 Pain Score: 0-No pain Faces Pain Scale: No hurt   Therapy/Group: Individual Therapy  Ayonna Speranza 04/12/2018, 4:47 PM

## 2018-04-13 ENCOUNTER — Inpatient Hospital Stay (HOSPITAL_COMMUNITY): Payer: Medicare Other | Admitting: Physical Therapy

## 2018-04-13 ENCOUNTER — Inpatient Hospital Stay (HOSPITAL_COMMUNITY): Payer: Medicare Other | Admitting: Occupational Therapy

## 2018-04-13 LAB — GLUCOSE, CAPILLARY
Glucose-Capillary: 98 mg/dL (ref 70–99)
Glucose-Capillary: 105 mg/dL — ABNORMAL HIGH (ref 70–99)
Glucose-Capillary: 95 mg/dL (ref 70–99)

## 2018-04-13 NOTE — Progress Notes (Signed)
Occupational Therapy Session Note  Patient Details  Name: Marcus Beasley MRN: [id number] Date of Birth: 09/18/1945  Today's Date: 04/13/2018 OT Individual Time:  0845-1000 (75 mins)      Short Term Goals: Week 4:  OT Short Term Goal 1 (Week 4): STG=LTG due to LOS  Skilled Therapeutic Interventions/Progress Updates:    Pt supine in bed upon entry with no reports of pain however by end of session pt reporting 6/10 dull/aching pain in LUE- RN notified and made aware. Pt performed squat pivot transfers this session with CGA. Pt requesting to use bathroom- pt transferred onto drop arm BSC at bedside for bowel movement. Pt hesitant about using bathroom in room however continuous education provided regarding this would be method for d/c home with pt then agreeable following encouragement from therapist. Periarea hygiene completed by therapist for thoroughness however pt able to complete with cut out. Pt then transferred to w/c and performed dressing tasks while seated in w/c with pt reporting feeling "safer" sitting in w/c compared to TTB for dressing tasks. Plans to d/c home with pt performing bathing tasks on TTB then transferring to w/c for dressing tasks. Standing at [facility] with CGA, underwear/pants pulled up by therapist in order for pt to simulate maintaining balance at [facility] in order for wife to pull pants up at home for decreased fall risk. Pt transferred to w/c and performed grooming tasks while sitting at sink. Pt then performed hemi technique for w/c mobility with supervision to gym.  In therapy gym, NMR completed of LUE with focus on isolating muscle movements in order to obtain full elbow extension during functional movement patterns. HOH assist completed with facilitation at elbow in order to complete functional movement patterns and reduce compensatory movements. Pt then performed 1 trial of shoulder flexion <> shoulder extension with elbow flexion <> extension in punching out pattern with  continued focus on full elbow extension with fist closed. Pt left seated in w/c in therapy gym with hand off to PT for next session.   Therapy Documentation Precautions:  Precautions Precautions: Fall Precaution Comments: L hemiparesis; strong L lateral lean in standing; pushing tendancies Restrictions Weight Bearing Restrictions: No ADL: ADL Equipment Provided: Other (comment)(elastic shoe laces) Grooming: Setup Where Assessed-Grooming: Wheelchair, Sitting at sink Upper Body Dressing: Supervision/safety Where Assessed-Upper Body Dressing: Wheelchair Lower Body Dressing: Minimal assistance Where Assessed-Lower Body Dressing: Wheelchair Toileting: Minimal assistance Where Assessed-Toileting: Bedside Commode Toilet Transfer: Therapist, music Method: Engineer, water: Other (comment)(Padded tub bench with cutout)   Therapy/Group: Individual Therapy  Dareon Nunziato 04/13/2018, 11:32 AM

## 2018-04-13 NOTE — Progress Notes (Signed)
[facility] PHYSICAL MEDICINE & REHABILITATION PROGRESS NOTE  Subjective/Complaints: Patient seen lying in bed this morning.  He states he slept well overnight.  ROS: Denies CP, SOB, nausea, vomiting, diarrhea.  Objective: Vital Signs: Blood pressure 120/81, pulse 66, temperature 98.1 F (36.7 C), temperature source Oral, resp. rate 18, height 5\' 11"  (1.803 m), weight 101.3 kg, SpO2 98 %. No results found. No results for input(s): WBC, HGB, HCT, PLT in the last 72 hours. No results for input(s): NA, K, CL, CO2, GLUCOSE, BUN, CREATININE, CALCIUM in the last 72 hours.  Physical Exam: BP 120/81 (BP Location: Right Arm)   Pulse 66   Temp 98.1 F (36.7 C) (Oral)   Resp 18   Ht 5\' 11"  (1.803 m)   Wt 101.3 kg   SpO2 98%   BMI 31.15 kg/m  Constitutional: No distress . Vital signs reviewed. HENT: Normocephalic.  Atraumatic. Eyes: EOMI. No discharge. Cardiovascular:  RRR.  No JVD. Respiratory: CTA  bilaterally.  Normal effort. GI: BS +. Non-distended. Musc: No edema or tenderness in extremities. Neurological: He is alert and oriented Follows basic commands Motor:  LUE: Shoulder abduction 3/5, elbow flex 4-/5, elbow extension 3+/5, hand grip 4-4+/5, stable LLE: HF, KE, ADF 4/5,  stable No increase in tone Left facial droop Skin: Skin is warm and dry.  Psychiatric: He has a normal mood and affect. His behavior is normal. Thought content normal.   Assessment/Plan: 1. Functional deficits secondary to right thalamic hemorrhage which require 3+ hours per day of interdisciplinary therapy in a comprehensive inpatient rehab setting.  Physiatrist is providing close team supervision and 24 hour management of active medical problems listed below.  Physiatrist and rehab team continue to assess barriers to discharge/monitor patient progress toward functional and medical goals  Care Tool:  Bathing    Body parts bathed by patient: Left arm, Chest, Abdomen, Front perineal area, Right  upper leg, Left upper leg, Right lower leg, Left lower leg, Face, Buttocks, Right arm   Body parts bathed by helper: Right arm     Bathing assist Assist Level: Minimal Assistance - Patient > 75%     Upper Body Dressing/Undressing Upper body dressing   What is the patient wearing?: Pull over shirt    Upper body assist Assist Level: Supervision/Verbal cueing    Lower Body Dressing/Undressing Lower body dressing      What is the patient wearing?: Underwear/pull up, Pants     Lower body assist Assist for lower body dressing: Minimal Assistance - Patient > 75%     Toileting Toileting    Toileting assist Assist for toileting: Moderate Assistance - Patient 50 - 74% Assistive Device Comment: urinal   Transfers Chair/bed transfer  Transfers assist     Chair/bed transfer assist level: Minimal Assistance - Patient > 75%     Locomotion Ambulation   Ambulation assist      Assist level: Minimal Assistance - Patient > 75% Assistive device: Orthosis Max distance: 40   Walk 10 feet activity   Assist  Walk 10 feet activity did not occur: Safety/medical concerns  Assist level: Minimal Assistance - Patient > 75% Assistive device: Walker-rolling(L hand splint, L knee cage)   Walk 50 feet activity   Assist Walk 50 feet with 2 turns activity did not occur: Safety/medical concerns  Assist level: Minimal Assistance - Patient > 75% Assistive device: Walker-rolling    Walk 150 feet activity   Assist Walk 150 feet activity did not occur: Safety/medical concerns  Assist  level: Minimal Assistance - Patient > 75% Assistive device: Walker-rolling(L hand splint and L knee cage)    Walk 10 feet on uneven surface  activity   Assist Walk 10 feet on uneven surfaces activity did not occur: Safety/medical concerns         Wheelchair     Assist Will patient use wheelchair at discharge?: Yes Type of Wheelchair: Manual    Wheelchair assist level:  Supervision/Verbal cueing Max wheelchair distance: 150'    Wheelchair 50 feet with 2 turns activity    Assist        Assist Level: Supervision/Verbal cueing   Wheelchair 150 feet activity     Assist Wheelchair 150 feet activity did not occur: Safety/medical concerns   Assist Level: Supervision/Verbal cueing      Medical Problem List and Plan: 1. Left-sided weakness secondary to right thalamic hemorrhage secondary to hypertensive crisis             Cont CIR 2. DVT Prophylaxis/Anticoagulation: Subcutaneous heparin initiated 03/14/2018. Monitor for any bleeding episodes 3. Pain Management: Tylenol as needed  Gabapentin 100 3 times daily started on 10/10-with improvement 4. Mood: Provide emotional support 5. Neuropsych: This patient is capable of making decisions on his own behalf. 6. Skin/Wound Care: Routine skin checks 7. Fluids/Electrolytes/Nutrition: Routine in and outs              BMP within acceptable range on 10/18  8. PAF with RVR. Cardizem CD 180 mg daily, Lopressor 50 mg twice daily. Cardiac rate controlled. Follow-up cardiology services  Controlled on 10/22 9. Diabetes mellitus.Hemoglobin A1c 6.4. Glucophage 850 mg twice daily. Check blood sugars before meals and at bedtime.              CBG (last 3)  Recent Labs    04/12/18 1634 04/12/18 2142 04/13/18 0636  GLUCAP 111* 100* 98    Controlled 10/22 10. Hyperlipidemia. Patient on Lipitor prior to admission 11.  Hypertension  Today's Vitals   04/12/18 2110 04/12/18 2321 04/13/18 0520 04/13/18 0758  BP:   120/81   Pulse:   66   Resp:   18   Temp:   98.1 F (36.7 C)   TempSrc:   Oral   SpO2:   98%   Weight:      Height:      PainSc: 6  Asleep  0-No pain   Body mass index is 31.15 kg/m.  Continue Lopressor  Controlled on 10/22  LOS: 27 days A FACE TO FACE EVALUATION WAS PERFORMED  Kamyra Schroeck [name] 04/13/2018, 8:30 AM

## 2018-04-13 NOTE — Progress Notes (Signed)
Physical Therapy Session Note  Patient Details  Name: Marcus Beasley MRN: [id number] Date of Birth: 12-30-1945  Today's Date: 04/13/2018 PT Individual Time: 1000-1100 and 1300-1430 PT Individual Time Calculation (min): 150 min   Short Term Goals: Week 4:  PT Short Term Goal 1 (Week 4): =LTG due to estimated LOS  Skilled Therapeutic Interventions/Progress Updates:    Session 1: Pt received sitting in w/c in rehab gym; complaints of L should pain as described below; agreeable to PT treatment. This session focused on functional mobility with trialing stair navigation techniques and performing transfers mimicking home environment. CGA stair navigation (four 6inch steps) with step-to pattern, knee cage, and R handrail x 6 trials; trials 1&2) ascend and descend forward, trials 3&4) ascend forward and descend backwards, trials 5&6) ascend forward and descend laterally to R (R side stepping); mod verbal cues for feet placement to prevent adducting and ER of LLE and to ensure there is room for both feet on each step. Pt preferred descending laterally leading with RLE. CGA squat pivot transfers to/from w/c to recliner, couch, and queen bed; transfers completed with ease but pt requires mod cues for w/c placement, part management, and sequence (I.e. scoot to edge, arm rest out of way, feet placement, hand placement). Independent with bed mobility (sit>supine, rolling L/R, R sidelying>sitting EOB). Pt able to urinate in urninal while sitting with set up assist. CGA squat pivot to hospital bed. Remained laying in bed with 4 rails up per request, bed alarm intact, and all needs in reach.  Session 2: Pt received asleep in bed; reports arm is "feeling better than this morning;" agreeable to treatment.  CGA squat pivot and sit>stand transfers throughout session. ModI w/c propulsion to/from gym x 150' with R hemi technique. Pt led instruction of 2 helpers with ascending/descending stairs for reinforcement of  sequence/techniques/what cues to give family members once discharged home; min reminders for what cues to give helpers (be specific about where helpers should be, how they hold his hand, where their hand should be on him (waist and under L shoulder, etc)). Bridges 3x15 for hip musculature strengthening; set 1) normal, set 2) with ball for adduction squeeze, set 3) with theraband for ER. CGA side stepping in ladder x 2 leading with each LE for LE coordination and foot placement; mod verbal cues to "lead with heel" to prevent LEs from ERing and upright posture. HHA x 2 gait for ~75'. Pt exhibited narrow BOS and leaning/pushing to L; required mod/max cues for foot placement to widen BOS and to prevent scissoring pattern and to bear weight through LLE. CGA cone weaving with RW and L hand splint for LE coordination and navigating turns; a couple minor LOBs that required minA to correct; mod verbal/tactile cues for foot placement, BOS, RW placement, and weightbearing through LLE. Seated toileting with urinal with set up assist. Pt remained seated in w/c with RN present.   Therapy Documentation Precautions:  Precautions Precautions: Fall Precaution Comments: L hemiparesis; strong L lateral lean in standing; pushing tendancies Restrictions Weight Bearing Restrictions: No Pain: Pain Assessment Pain Scale: 0-10 Pain Score: 6  Pain Type: Acute pain Pain Location: Arm Pain Orientation: Left Pain Descriptors / Indicators: Burning;Tingling Pain Onset: On-going Pain Intervention(s): Medication (See eMAR)    Therapy/Group: Individual Therapy  Martin[place] Wilferd Ritson 04/13/2018, 12:23 PM

## 2018-04-13 NOTE — Plan of Care (Signed)
  Problem: RH Ambulation Goal: LTG Patient will ambulate in home environment (PT) Description LTG: Patient will ambulate in home environment, # of feet with assistance (PT). Outcome: Not Applicable Flowsheets (Taken 04/13/2018 1049) LTG: Pt will ambulate in home environ  assist needed:: -- (d/c goal; recommending w/c level at home following d/c) Note:  D/c goal; recommending w/c level at home following d/c

## 2018-04-14 ENCOUNTER — Inpatient Hospital Stay (HOSPITAL_COMMUNITY): Payer: Medicare Other | Admitting: Occupational Therapy

## 2018-04-14 ENCOUNTER — Ambulatory Visit (HOSPITAL_COMMUNITY): Payer: Medicare Other | Admitting: Physical Therapy

## 2018-04-14 ENCOUNTER — Inpatient Hospital Stay (HOSPITAL_COMMUNITY): Payer: Medicare Other | Admitting: Physical Therapy

## 2018-04-14 LAB — GLUCOSE, CAPILLARY
Glucose-Capillary: 93 mg/dL (ref 70–99)
Glucose-Capillary: 100 mg/dL — ABNORMAL HIGH (ref 70–99)
Glucose-Capillary: 89 mg/dL (ref 70–99)

## 2018-04-14 NOTE — Progress Notes (Signed)
Occupational Therapy Session Note  Patient Details  Name: Marcus Beasley MRN: [id number] Date of Birth: 11-14-1945  Today's Date: 04/14/2018 OT Individual Time: 5997-7414 OT Individual Time Calculation (min): 70 min    Short Term Goals: Week 4:  OT Short Term Goal 1 (Week 4): STG=LTG due to LOS  Skilled Therapeutic Interventions/Progress Updates:    Pt supine in bed upon entry eager to begin therapy session with no reports of pain. Pt hesitant to perform bathing session on TTB however encouragement from therapist to complete on TTB at bedside vs in shower due to set up at home in prep for d/c- pt then agreeable. Pt performed squat pivot transfers this session with close supervision-CGA req min VC's at times for w/c positioning. Pt transferred onto TTB and performed bathing tasks while seated on w/c demonstrating good carryover of utilizing lateral leans for buttocks hygiene. Continuous education provided to ensure carryover of assist level needed by family at d/c when performing bathing and dressing tasks especially LB req guarding assist. Pt then transferred to w/c for dressing tasks. Pt able to recall method for pulling pants up/down for d/c home, pt reporting "I focus on standing at the walker while my wife pulls the pants up." In order to challenge balance, pt completed pulling pants/underwear up with min A for balance with pt req min VC's for pacing due to pt rushing through task impacting balance. Pt returned to w/c and then performed grooming tasks sitting at sink. At end of OT session, pt's wife, daughter and granddaughter present. Discussed with family overview of pt's CLOF specifically with functional transfers and self care tasks as well as education on use of padded TTB for seated bathing tasks as well as DME (drop arm BSC). Pt left seated in w/c with all needs in reach and family present.   Therapy Documentation Precautions:  Precautions Precautions: Fall Precaution Comments: L  hemiparesis; strong L lateral lean in standing; pushing tendancies Restrictions Weight Bearing Restrictions: No ADL: ADL Equipment Provided: Other (comment)(elastic shoe laces) Grooming: Setup Where Assessed-Grooming: Wheelchair, Sitting at sink Upper Body Bathing: Supervision/safety Where Assessed-Upper Body Bathing: (TTB at bedside ) Lower Body Bathing: Minimal assistance Where Assessed-Lower Body Bathing: Other (Comment)(TTB at bedside ) Upper Body Dressing: Supervision/safety Where Assessed-Upper Body Dressing: Wheelchair Lower Body Dressing: Minimal assistance Where Assessed-Lower Body Dressing: Wheelchair Toileting: Minimal assistance Where Assessed-Toileting: standing at [employer] with urinal  Toilet Transfer: Contact guard    Therapy/Group: Individual Therapy  [name] 04/14/2018, 7:04 AM

## 2018-04-14 NOTE — Progress Notes (Addendum)
[facility] PHYSICAL MEDICINE & REHABILITATION PROGRESS NOTE  Subjective/Complaints: Patient seen sitting up in bed this morning.  He states he slept well overnight.  He is appreciative for his care.  He is looking forward to discharge tomorrow.  ROS: Denies CP, SOB, nausea, vomiting, diarrhea.  Objective: Vital Signs: Blood pressure 121/81, pulse 66, temperature 98.1 F (36.7 C), temperature source Oral, resp. rate 18, height 5\' 11"  (1.803 m), weight 101.3 kg, SpO2 96 %. No results found. No results for input(s): WBC, HGB, HCT, PLT in the last 72 hours. No results for input(s): NA, K, CL, CO2, GLUCOSE, BUN, CREATININE, CALCIUM in the last 72 hours.  Physical Exam: BP 121/81 (BP Location: Right Arm)   Pulse 66   Temp 98.1 F (36.7 C) (Oral)   Resp 18   Ht 5\' 11"  (1.803 m)   Wt 101.3 kg   SpO2 96%   BMI 31.15 kg/m  Constitutional: No distress . Vital signs reviewed. HENT: Normocephalic.  Atraumatic. Eyes: EOMI. No discharge. Cardiovascular:  RRR.  No JVD. Respiratory: CTA  bilaterally.  Normal effort. GI: BS +. Non-distended. Musc: No edema or tenderness in extremities. Neurological: He is alert and oriented Follows basic commands Motor:  LUE: Shoulder abduction 3/5, elbow flex 4-/5, elbow extension 3+/5, hand grip 4-4+/5, unchanged LLE: HF, KE, ADF 4/5,  unchanged Left facial droop Skin: Skin is warm and dry.  Psychiatric: He has a normal mood and affect. His behavior is normal. Thought content normal.   Assessment/Plan: 1. Functional deficits secondary to right thalamic hemorrhage which require 3+ hours per day of interdisciplinary therapy in a comprehensive inpatient rehab setting.  Physiatrist is providing close team supervision and 24 hour management of active medical problems listed below.  Physiatrist and rehab team continue to assess barriers to discharge/monitor patient progress toward functional and medical goals  Care Tool:  Bathing    Body parts bathed  by patient: Left arm, Chest, Abdomen, Front perineal area, Right upper leg, Left upper leg, Right lower leg, Left lower leg, Face, Buttocks, Right arm   Body parts bathed by helper: Right arm     Bathing assist Assist Level: Minimal Assistance - Patient > 75%     Upper Body Dressing/Undressing Upper body dressing   What is the patient wearing?: Pull over shirt    Upper body assist Assist Level: Supervision/Verbal cueing    Lower Body Dressing/Undressing Lower body dressing      What is the patient wearing?: Underwear/pull up, Pants     Lower body assist Assist for lower body dressing: Minimal Assistance - Patient > 75%     Toileting Toileting    Toileting assist Assist for toileting: Set up assist Assistive Device Comment: seated with urinal   Transfers Chair/bed transfer  Transfers assist     Chair/bed transfer assist level: Contact Guard/Touching assist     Locomotion Ambulation   Ambulation assist      Assist level: Minimal Assistance - Patient > 75% Assistive device: Walker-rolling Max distance: 75   Walk 10 feet activity   Assist  Walk 10 feet activity did not occur: Safety/medical concerns  Assist level: Minimal Assistance - Patient > 75% Assistive device: Walker-rolling   Walk 50 feet activity   Assist Walk 50 feet with 2 turns activity did not occur: Safety/medical concerns  Assist level: Minimal Assistance - Patient > 75% Assistive device: Walker-rolling    Walk 150 feet activity   Assist Walk 150 feet activity did not occur: Safety/medical concerns  Assist level: Minimal Assistance - Patient > 75% Assistive device: Walker-rolling(L hand splint and L knee cage)    Walk 10 feet on uneven surface  activity   Assist Walk 10 feet on uneven surfaces activity did not occur: Safety/medical concerns         Wheelchair     Assist Will patient use wheelchair at discharge?: Yes Type of Wheelchair: Manual    Wheelchair  assist level: Set up assist Max wheelchair distance: 150'    Wheelchair 50 feet with 2 turns activity    Assist        Assist Level: Set up assist   Wheelchair 150 feet activity     Assist Wheelchair 150 feet activity did not occur: Safety/medical concerns   Assist Level: Set up assist      Medical Problem List and Plan: 1. 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 DVT Prophylaxis/Anticoagulation: Subcutaneous heparin initiated 03/14/2018. Monitor for any bleeding episodes 3. Pain Management: Tylenol as needed  Gabapentin 100 3 times daily started on 10/10-with improvement 4. Mood: Provide emotional support 5. Neuropsych: This patient is capable of making decisions on his own behalf. 6. Skin/Wound Care: Routine skin checks 7. Fluids/Electrolytes/Nutrition: Routine in and outs              BMP within acceptable range on 10/18  8. PAF with RVR. Cardizem CD 180 mg daily, Lopressor 50 mg twice daily. Cardiac rate controlled. Follow-up cardiology services  Controlled on 10/23 9. Diabetes mellitus.Hemoglobin A1c 6.4. Glucophage 850 mg twice daily. Check blood sugars before meals and at bedtime.              CBG (last 3)  Recent Labs    04/13/18 1143 04/13/18 1625 04/14/18 0642  GLUCAP 105* 95 93    Controlled 10/23 10. Hyperlipidemia. Patient on Lipitor prior to admission 11.  Hypertension  Today's Vitals   04/13/18 1944 04/13/18 2024 04/13/18 2124 04/14/18 0519  BP: 138/89   121/81  Pulse: 73   66  Resp: 19   18  Temp: 98.5 F (36.9 C)   98.1 F (36.7 C)  TempSrc: Oral   Oral  SpO2: 96%   96%  Weight:      Height:      PainSc:  6  4     Body mass index is 31.15 kg/m.  Continue Lopressor  Controlled on  10/23  LOS: 28 days A FACE TO FACE EVALUATION WAS PERFORMED  Antonius Hartlage [name] 04/14/2018, 8:32 AM

## 2018-04-14 NOTE — Discharge Summary (Signed)
Discharge summary job [phone]

## 2018-04-14 NOTE — Progress Notes (Signed)
Social Work Patient ID: Marcus Beasley, male   DOB: 02-Apr-1946, 72 y.o.   MRN: [id number]  Met with pt, wife and daughter who were here for family education report it went well and they feel prepared for discharge tomorrow. Have ordered equipment, daughter to get tub bench on her own due to lower cost on-line. Will see in am to make sure no last minute questions.

## 2018-04-14 NOTE — Progress Notes (Signed)
Physical Therapy Discharge Summary  Patient Details  Name: Marcus Beasley MRN: [id number] Date of Birth: [birth year]  Today's Date: 04/14/2018 PT Individual Time: 1000-1100 and  1300-1400 PT Individual Time Calculation (min): 120 min     Patient has met 10 of 10 long term goals due to improved activity tolerance, improved balance, improved postural control, increased strength, increased range of motion, functional use of  left lower extremity, improved attention, improved awareness, improved coordination and midline orientation.  Patient to discharge at a wheelchair level Modified Independent.   Patient's care partner requires assistance and able to provide necessary assist for wheelchair mobility however recommend 2 people to aid with stair navigation to enter/exit home for safety to provide the necessary physical assistance at discharge. Recommend patient does not walk at home except with home health PT. Pt and all present family members were understanding.   Reasons goals not met: All goals met.  Recommendation:  Patient will benefit from ongoing skilled PT services in home health setting to continue to advance safe functional mobility, address ongoing impairments in postural control, endurance, balance, gait, and minimize fall risk.  Equipment: manual wheelchair, RW, L knee cage, L hand splint  Reasons for discharge: treatment goals met and discharge from hospital  Patient/family agrees with progress made and goals achieved: Yes  PT Discharge Precautions/Restrictions Precautions Precautions: Fall Precaution Comments: L hemiparesis; L lateral lean at times in standing Restrictions Weight Bearing Restrictions: No Vital Signs Therapy Vitals Temp: 98.6 F (37 C) Temp Source: Oral Pulse Rate: 82 Resp: 16 BP: 117/84 Patient Position (if appropriate): Lying Oxygen Therapy SpO2: 99 % O2 Device: Room Air Pain Pain Assessment Pain Scale: Faces Faces Pain Scale: Hurts a little  bit Pain Location: Shoulder Pain Orientation: Left Pain Descriptors / Indicators: Aching Pain Onset: On-going Pain Intervention(s): Medication (See eMAR);Repositioned Multiple Pain Sites: No Vision/Perception  Perception Perception: Within Functional Limits Inattention/Neglect: (decreased attention to L UE/LE) Praxis Praxis: Intact  Cognition Overall Cognitive Status: Within Functional Limits for tasks assessed Arousal/Alertness: Awake/alert Orientation Level: Oriented X4 Sustained Attention: Appears intact Memory: Appears intact Awareness: Appears intact Problem Solving: Appears intact Self Correcting: Impaired Self Correcting Impairment: Functional complex Behaviors: Impulsive Safety/Judgment: Impaired Comments: Improvements in safety/judgement however still requires verbal cues for correcting Sensation Sensation Light Touch: Appears Intact Central sensation comments: Pt did not recognize any stimulus on dorsal or ventral side  Proprioception: Impaired by gross assessment Proprioception Impaired Details: Impaired LUE;Impaired LLE Stereognosis: Not tested Coordination Gross Motor Movements are Fluid and Coordinated: No Coordination and Movement Description: gross LLE movement uncooridnated but improved from eval; able to use during gait and w/c propulsion but requires frequent verbal cues for use Motor  Motor Motor: Other (comment)(L hemiparesis ) Motor - Discharge Observations: Increased time for initiation of L UE/LE movements but improvements over LOS. Occasional L lateral lean in standing that is self corrected with verbal cues  Mobility Bed Mobility Bed Mobility: Rolling Right;Rolling Left;Right Sidelying to Sit;Sit to Supine;Scooting to [facility] Rolling Right: Independent Rolling Left: Independent Right Sidelying to Sit: Independent Sitting - Scoot to Edge of Bed: Independent Sit to Supine: Independent Scooting to [facility]: Independent Transfers Transfers: Sit to  Stand;Stand to Sit;Squat Pivot Transfers Sit to Stand: Supervision/Verbal cueing Stand to Sit: Supervision/Verbal cueing Stand Pivot Transfers: Contact Guard/Touching assist Squat Pivot Transfers: Supervision/Verbal cueing Transfer (Assistive device): Rolling walker Locomotion  Gait Gait Assistance: Contact Guard/Touching assist Gait Distance (Feet): 150 Feet Assistive device: Rolling walker;Other (Comment)(L hand splint and L knee cage) Gait  Assistance Details: Verbal cues for technique;Verbal cues for sequencing;Tactile cues for weight shifting;Tactile cues for weight beaing;Verbal cues for precautions/safety;Verbal cues for gait pattern;Verbal cues for safe use of DME/AE(verbal cues for L foot placement to prevent LLE from adducting/scissoring and ERing, and to promote L heel strike; verbal cues for R swing through) Gait Gait: Yes Gait Pattern: Impaired Gait Pattern: Step-through pattern;Decreased step length - right;Within Functional Limits(when ambulating without knee cage, L knee recurvartum) Gait velocity: 0.83 ft/sec Stairs / Additional Locomotion Stairs: Yes Stairs Assistance: Contact Guard/Touching assist Stair Management Technique: One rail Right;Step to pattern;Forwards;Other (comment);Sideways(forwards ascending; sideways descending) Number of Stairs: 8 Height of Stairs: 6 Ramp: Contact Guard/touching assist Wheelchair Mobility Wheelchair Mobility: Yes Wheelchair Assistance: Set up assist Wheelchair Propulsion: Right lower extremity;Right upper extremity Wheelchair Parts Management: Needs assistance Distance: 150'  Trunk/Postural Assessment  Cervical Assessment Cervical Assessment: Exceptions to WFL(forward head ) Thoracic Assessment Thoracic Assessment: Exceptions to WFL(kyphotic ) Lumbar Assessment Lumbar Assessment: Exceptions to WFL(posterior pelvic tilt ) Postural Control Postural Control: Deficits on evaluation Trunk Control: L lateral lean at times    Balance Balance Balance Assessed: Yes Static Sitting Balance Static Sitting - Balance Support: Feet supported Static Sitting - Level of Assistance: 6: Modified independent (Device/Increase time) Dynamic Sitting Balance Dynamic Sitting - Balance Support: During functional activity;Feet supported Dynamic Sitting - Level of Assistance: 6: Modified independent (Device/Increase time) Dynamic Sitting - Balance Activities: Lateral lean/weight shifting;Forward lean/weight shifting Static Standing Balance Static Standing - Balance Support: During functional activity Static Standing - Level of Assistance: 5: Stand by assistance Static Standing - Comment/# of Minutes: occasionaly L lateral leans that are corrected with verbal cues Dynamic Standing Balance Dynamic Standing - Balance Support: Bilateral upper extremity supported;During functional activity Dynamic Standing - Level of Assistance: 5: Stand by assistance;4: Min assist Dynamic Standing - Balance Activities: Lateral lean/weight shifting;Forward lean/weight shifting Extremity Assessment  RUE Assessment RUE Assessment: Within Functional Limits General Strength Comments: 4/5  LUE Assessment LUE Assessment: Exceptions to [facility] Active Range of Motion (AROM) Comments: Full ROM with inreased time to reach full ROM due to coordination and motor deficits  General Strength Comments: shoulder: 3+/5; elbow 4/5; grip strength present but at diminished level  LUE Body System: Neuro Brunstrum levels for arm and hand: Arm Brunstrum level for arm: Stage IV Movement is deviating from synergy RLE Assessment RLE Assessment: Within Functional Limits General Strength Comments: grossly 5/5 LLE Assessment LLE Assessment: Exceptions to [facility] General Strength Comments: L hemiparesis; grossly 4-5/5  Skilled Therapeutic Interventions Session 1: Pt received laying in bed with wife, daughter, and granddaughter present; agreeable to PT treatment. ModI w/c  propulsion with R hemi technique to/from/around unit throughout session. Family education provided during session regarding set up, body position, amount of help, sequence, and cues to give/ask patient during functional transfers and stair navigation. Pt able to appropriately ask for the help he needs, but requires verbal cues for sequence and attention to L UE and LE. Family/pt provided with handout that included the aforementioned information regarding sequence, attention, amount of help, set up, etc. Patient is not to walk at home unless with home health therapy. CGA squat pivot car transfer x 2. CGA stair navigation four 6 inch steps x 2 trials; ascended with R handrail and step to pattern and descended sideways with both hands on rail and step to pattern. At home, one family member to provide CGA from side/behind and other to support hands on rail. CGA gait with RW and L hand splint  to show family members of improvements in patients mobility, but reiterated that patient should not ambulate at home. S squat pivot transfer to bed. Family/pt questions regarding equipment and home health physical therapy were answered. No further questions at the end of the session. Pt remained laying in bed with family members present and all needs in reach.  Session 2: Pt received laying in bed with granddaughter present; agreeable to PT. Motor, mobility, locomotion, postural control, and balance were assessed as mention above. Pt is independent with bed mobility and sitting balance, supervision with sit><stand and bed><chair transfers, CGA with furniture and car transfers, CGA gait with RW, L hand splint, and L knee cage up to 150', CGA stair navigation with 1 handrail, and modI with w/c propulsion with R hemi technique up to 150'. Pt able to complete 2 floor transfers with verbal cueing for sequence/technique/body position; educated on what to do if a fall were to happen at home (assess self for injury, call for help if  injured, use sturdy chair/furniture to help get up, if family member helps, don't let them pull him up, etc); attempted rising from ground with R and L half kneel technique; pt educated to use left half kneel technique for ease of transfer. CGA gait trials with RW, L hand splint, and L knee cage: 1) 100 ft, 2) on ramp and uneven surface, and 3) 150 ft with focus on L heel strike for more normalized gait pattern. ModI w/c propulsion return to room. Pt remained sitting in w/c with granddaughter present.  Martinique Cailan General 04/14/2018, 4:03 PM

## 2018-04-14 NOTE — Discharge Summary (Addendum)
NAME: Marcus Beasley, Marcus Beasley MEDICAL RECORD WN:[id number] ACCOUNT [id number] DATE OF BIRTH:Dec 09, 1945 FACILITY: [facility]ATION: [facility] PHYSICIAN:ANKIT PATEL, MD  DISCHARGE SUMMARY  DATE OF DISCHARGE:  04/15/2018  DISCHARGE DIAGNOSES: 1.  Right thalamic hemorrhage secondary to hypertensive crisis. 2.  Subcutaneous heparin initiated 03/14/2018 for deep venous thrombosis prophylaxis. 3.  Pain management. 4.  Paroxysmal atrial fibrillation with rapid ventricular response. 5.  Diabetes mellitus. 6.  Hyperlipidemia. 7.  Hypertension.  HOSPITAL COURSE:  This is a 72 year old right-handed male with history of diabetes mellitus, hypertension.  He lives with spouse.  Independent prior to admission.  Multilevel home.  Presented 03/12/2018 with left-sided weakness, slurred speech, facial  numbness.  Blood pressure 168/98.  MRI reviewed showing right thalamic hemorrhage.  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  Carotid Dopplers with no ICA stenosis.  Echocardiogram with ejection fraction of 70%.  No wall motion abnormalities.  During admission, patient developed PAF with RVR.  Received intravenous Cardizem.  Cardiology service was  consulted.  Rate controlled on metoprolol.  Cardizem adjusted.  Subcutaneous heparin initiated for DVT prophylaxis on 03/21/2018.  Therapy evaluations completed, and the patient was admitted for comprehensive rehab program.  PAST MEDICAL HISTORY:  See discharge diagnoses.  SOCIAL HISTORY:  Lives with spouse, independent prior to admission.  FUNCTIONAL STATUS:  Upon admission to rehab services was +2 physical assist ambulate 2 feet, 2-person handheld assist equipment; use moderate assist sit to supine, supine to sit; mod/max assist with activities of daily living.  PHYSICAL EXAMINATION: VITAL SIGNS:  Blood pressure  155/94, pulse 79, temperature 98, respirations 20. GENERAL:  Alert male in no acute distress. HEENT:  EOMs intact. NECK:  Supple, nontender, no JVD. CARDIOVASCULAR:  Rate controlled. ABDOMEN:  Soft, nontender, good bowel sounds. LUNGS:  Clear to auscultation without wheeze, noted left-sided weakness.  REHABILITATION HOSPITAL COURSE:  The patient was admitted to inpatient rehab services.  Therapies initiated on a 3-hour daily basis, consisting of physical therapy, occupational therapy and rehabilitation nursing.  The following issues were addressed  during patient's rehabilitation stay.  Pertaining to the patient's right thalamic hemorrhage, remained stable.  He would follow up with neurology services.  Thalamic hemorrhage felt to be induced by hypertensive crisis.  He remained on Cardizem as well  as Lopressor with also noted PAF with RVR with planned follow up cardiology services.  No chest pain, shortness of breath.  Blood sugar is controlled.  Hemoglobin A1c of 6.4.  Remained on Glucophage with diabetic teaching.  Lipitor ongoing for  hyperlipidemia.  The patient received weekly collaborative interdisciplinary team conferences to discuss estimated length of stay, family teaching, any barriers to discharge.  Sessions focused on functional mobility, stair negotiation, performing transfers, mimicking  home environment.  Required contact guard assist for stair negotiation, contact guard assist squat-pivot transfers to wheelchair recliner.  Ambulates 150 feet with right hemi-technique.  Activities of daily living and homemaking.  Performed squat-pivot  transfers using bathroom, transferred onto drop-arm bedside commode, contact guard assist.  Needs some assist for lower body dressing.  Uses a rolling walker to pull up his pants.  Full family teaching was completed with planned discharge to home.  DISCHARGE MEDICATIONS:  Included Lipitor 20 mg p.o. daily, Cardizem 180 mg p.o. daily, Neurontin 100 mg  p.o. t.i.d., Glucophage 850 mg p.o. b.i.d., Lopressor 50 mg p.o. b.i.d., Protonix  40 mg p.o. daily, Senokot-S 2 tablets twice daily, hold for loose  stools.  DIET:  Diabetic diet.  FOLLOWUP:  The patient would follow up with Dr. Delice Lesch at the outpatient rehab service office as directed; Dr. Erlinda Hong of [facility] Neurology Service.  Call for appointment; Dr. Peter Martinique.  Call for appointment; Dr. Kathlene November, medical management.  SPECIAL INSTRUCTIONS:  No driving.  LN/NUANCE D:04/14/2018 T:04/14/2018 JOB:[id number]  Patient seen and examined by me on day of discharge. Delice Lesch, MD, ABPMR

## 2018-04-14 NOTE — Progress Notes (Signed)
Occupational Therapy Session Note  Patient Details  Name: Marcus Beasley MRN: [id number] Date of Birth: 13-Apr-1946  Today's Date: 04/14/2018 OT Individual Time: 1420-1450 OT Individual Time Calculation (min): 30 min    Short Term Goals: Week 4:  OT Short Term Goal 1 (Week 4): STG=LTG due to LOS  Skilled Therapeutic Interventions/Progress Updates:    Pt seen for OT session focusing on neuro re-ed, strengthening and stimulation in L UE. Pt in supine upon arrival, denying pain and agreeable to tx session. Granddaughter present in room, although she plans to be one of the primary caregivers at d/c, she declined needing any further hands on training/education in prep for d/c home and did not attend therapy session. Throughout session, pt completed squat pivot transfers with guarding assist, min cuing required for proper set-up of w/c and hand placement during transfers. He self propelled w/c throughout unit supervision using hemi-technique. L UE sensation competed seated EOM, pt with severely diminished absent light touch in entire L UE and 2/3 correct during proprioceptive testing. Pt educated regarding functional implications of each. He returned to sidelying position and addressed shoulder flexion in gravity eliminated position to promote normal movement patterns vs compensatory strategies. Min A required, likely due to fatigue and assist to break synergistic patterns. During AM session pt demonstrated full shoulder flexion ROM again gravity. Returned to sitting EOM, completed functional reaching task with L UE placed on Dynadisc for increased stimulation to L hand. Pt able to reach L and return to midline sitting with assist to maintain hand placement on disc. Pt returned to room at end of session, returned to supine per request and left with all needs in reach and family member present.   Therapy Documentation Precautions:  Precautions Precautions: Fall Precaution Comments: L hemiparesis; L  lateral lean at times in standing Restrictions Weight Bearing Restrictions: No   Therapy/Group: Individual Therapy  Corley Kohls L 04/14/2018, 3:00 PM

## 2018-04-14 NOTE — Discharge Instructions (Signed)
Inpatient Rehab Discharge Instructions  Marcus Beasley Discharge date and time: No discharge date for patient encounter.   Activities/Precautions/ Functional Status: Activity: activity as tolerated Diet: diabetic diet Wound Care: none needed Functional status:  ___ No restrictions     ___ Walk up steps independently ___ 24/7 supervision/assistance   ___ Walk up steps with assistance ___ Intermittent supervision/assistance  ___ Bathe/dress independently ___ Walk with walker     _x__ Bathe/dress with assistance ___ Walk Independently    ___ Shower independently ___ Walk with assistance    ___ Shower with assistance ___ No alcohol     ___ Return to work/school ________  Special Instructions: No driving    COMMUNITY REFERRALS UPON DISCHARGE:    Home Health:   PT, OT, SP,  [place]   Date of last service:04/15/2018  Medical Equipment/Items Ordered:WHEELCHAIR, ROLLING WALKER, DROP-ARM BEDSIDE COMMODE-daughter to get tub bench on her own  Agency/Supplier:[employer] [place]   [phone]   GENERAL COMMUNITY RESOURCES FOR PATIENT/FAMILY: Support Groups:[facility] SUPPORT GROUP  THE SECOND Thursday [place] @ 6:00-7:00 PM ON THE REHAB UNIT QUESTIONS CALL [phone]  My questions have been answered and I understand these instructions. I will adhere to these goals and the provided educational materials after my discharge from the hospital.  Patient/Caregiver Signature _______________________________ Date __________  Clinician Signature _______________________________________ Date __________  Please bring this form and your medication list with you to all your follow-up doctor's appointments.

## 2018-04-14 NOTE — Patient Care Conference (Signed)
Inpatient RehabilitationTeam Conference and Plan of Care Update Date: 04/14/2018   Time: 2:19 PM    Patient Name: Marcus Beasley Record Number: [id number]  Date of Birth: 08/27/1945 Sex: Male         Room/Bed: 4M07C/4M07C-01 Payor Info: Payor: [employer] / Plan: [employer] MEDICARE / Product Type: *No Product type* /    Admitting Diagnosis: R ICH  Admit Date/Time:  03/17/2018  3:24 PM Admission Comments: No comment available   Primary Diagnosis:  <principal problem not specified> Principal Problem: <principal problem not specified>  Patient Active Problem List   Diagnosis Date Noted  . Neuropathic pain   . Labile blood pressure   . PAF (paroxysmal atrial fibrillation) ([place])   . Diabetes mellitus type 2 in obese ([place])   . Hemiparesis affecting left side as late effect of stroke ([place])   . Thalamic hemorrhage ([place]) 03/17/2018  . Diabetes mellitus type 2 in nonobese ([facility])   . Benign essential HTN   . Dyslipidemia   . Paroxysmal A-fib ([place])   . Hemorrhagic stroke ([place])   . ICH (intracerebral hemorrhage) ([place]) 03/12/2018  . Prostate cancer ([place]) 01/29/2017  . 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 Diabetes ([place]) 12/14/2015  . PCP NOTES >>>>>>>>>>>>>>>>>>>>>>>>>>>>>>. 08/06/2015  . Dizziness and giddiness 01/29/2015  . Umbilical hernia 38/18/2993  . Elevated PSA, less than 10 ng/ml 04/19/2012  . Annual physical exam 01/24/2011  . Hyperlipidemia 01/03/2010  . Essential hypertension 09/20/2007    Expected Discharge Date: Expected Discharge Date: 04/15/18  Team Members Present: Physician leading conference: Dr. Delice Lesch Social Worker Present: Ovidio Kin, LCSW Nurse Present: Frances Maywood, RN PT Present: Kem Parkinson, PT OT Present: Napoleon Form, OT SLP Present: Windell Moulding, SLP PPS Coordinator present : Daiva Nakayama, RN, CRRN     Current Status/Progress Goal Weekly Team Focus  Medical   Left-sided weakness secondary to  right thalamic hemorrhage secondary to hypertensive crisis  Improve mobility, safety, transfers  See above   Bowel/Bladder   Patient continent of Bladder an Bowel; LBM 04/13/18  Remain continent of Bladder and Bowel  Assess and assist with toilet and bowel pattern  q shift and as needed   Swallow/Nutrition/ Hydration             ADL's   Close supervision-CGA squat pivot transfers; min A UB/LB bathing; steadying assist LB dressing; Recommending total A for toileting at d/c to reduce fall risk and caregiver burden  Supervision UB bathing/dressing; min A functional transfers  Pt to d/c home tomorrow at goal level. Family declining OT family education   Mobility   S transfers, minA overall  S transfers, minA overall; d/c home gait goal, recommending w/c level for safety  pt/family education, d/c planning, postural control, standing balance   Communication             Safety/Cognition/ Behavioral Observations            Pain   C/o of left shoulder pain; rates pain 6 out 10; prn tramadol request  pain < or = 3  Assess pain q shift and as needed and provide treatments to maintain pain a tolerable level or pain free   Skin   No skin breakdown  Free of skin breakdown  Assess skin q shift and as needed      *See Care Plan and progress notes for long and short-term goals.     Barriers to Discharge  Current  Status/Progress Possible Resolutions Date Resolved   Physician    Medical stability     See above  Therapies, d/c tomorrow      Nursing                  PT                    OT                  SLP                SW                Discharge Planning/Teaching Needs:  Wife to be here for family education for short time, needed to stay for all three therapies. Convinced she can provide the care pt will need at discharge from here.      Team Discussion:  Family education today with wife and daughter it went well and feel prepared for discharge tomorrow aware of the 24 hr  physical care he will need at discharge. Aware wheelchair level for home and only ambulation with therapist. Medically stable for discharge tomorrow.  Revisions to Treatment Plan:  DC 10/24    Continued Need for Acute Rehabilitation Level of Care: The patient requires daily medical management by a physician with specialized training in physical medicine and rehabilitation for the following conditions: Daily analysis of laboratory values and/or radiology reports with any subsequent need for medication adjustment of medical intervention for : Neurological problems   I attest that I was present, lead the team conference, and concur with the assessment and plan of the team.   Elease Hashimoto 04/14/2018, 2:19 PM

## 2018-04-14 NOTE — Progress Notes (Signed)
Occupational Therapy Discharge Summary  Patient Details  Name: TESEAN STUMP MRN: [id number] Date of Birth: 24-Jul-1945  Today's Date: 04/14/2018    Patient has met 12 of 12 long term goals due to improved activity tolerance, improved balance, postural control, ability to compensate for deficits, functional use of  LEFT upper and LEFT lower extremity, improved awareness and improved coordination.  Patient to discharge at overall supervision level with min A for LB dressing, bathing and toileting tasks. Pt has shown great progress during LOS. He is discharging at w/c level at home with instructions for ambulation only with home health therapists. Pt has been educated on and completed bathing and toileting tasks on TTB beside bedside in simulation for setup at home due to pt not having main level full bathroom or bedroom. Pt's family denied participating in formal hands on and observation family education with OT however granddaughter has been present early on in pt's stay observing OT sessions. Pt's family was present for remaining ~5 mins of OT session today and given overview of pt's CLOF for self care tasks. Handout given to pt's family as well with step by step instructions for self care tasks, squat pivot transfers, w/c mgmt and CLOF due to poor participation in family education prior to pt's d/c home.     Recommendation:  Patient will benefit from ongoing skilled OT services in home health setting to continue to advance functional skills in the area of BADL and Reduce care partner burden.  Equipment: Drop arm BSC, TTB (daughter purchasing from [employer])  Reasons for discharge: treatment goals met and discharge from hospital  Patient/family agrees with progress made and goals achieved: Yes  OT Discharge Precautions/Restrictions  Precautions Precautions: Fall Precaution Comments: L hemiparesis; L lateral lean at times in standing Restrictions Weight Bearing Restrictions:  No ADL ADL Equipment Provided: Other (comment)(elastic shoe laces) Grooming: Setup Where Assessed-Grooming: Wheelchair, Sitting at sink Upper Body Bathing: Supervision/safety Where Assessed-Upper Body Bathing: (TTB at bedside ) Lower Body Bathing: Minimal assistance Where Assessed-Lower Body Bathing: Other (Comment)(TTB at bedside ) Upper Body Dressing: Supervision/safety Where Assessed-Upper Body Dressing: Wheelchair Lower Body Dressing: Minimal assistance Where Assessed-Lower Body Dressing: Wheelchair Vision Baseline Vision/History: No visual deficits;Wears glasses Wears Glasses: Reading only Patient Visual Report: No change from baseline Vision Assessment?: No apparent visual deficits Perception  Perception: Within Functional Limits Inattention/Neglect: Other (comment)(Pt with decreased attention to L side of body in stimulating environment however able to correct with min VC's. Pt with LUE gross grasp however once attending to other task pt will lose grasp and drop item. Praxis Praxis: Intact Cognition Overall Cognitive Status: Within Functional Limits for tasks assessed Arousal/Alertness: Awake/alert Orientation Level: Oriented X4 Problem Solving: Appears intact Safety/Judgment: Impaired Comments: Pt with improvements in safety/judgement however still req supervision for VC's  Sensation Sensation Light Touch: Impaired Detail Central sensation comments: Pt did not recognize any stimulus on dorsal or ventral side  Light Touch Impaired Details: Absent LUE Hot/Cold: Not tested Proprioception: Impaired Detail Proprioception Impaired Details: Impaired LUE Stereognosis: Not tested Coordination Gross Motor Movements are Fluid and Coordinated: No Fine Motor Movements are Fluid and Coordinated: No Coordination and Movement Description: Pt's gross and fine motor movements of LUE are uncoordinated however improvements shown over LOS with pt able to use LUE during functional tasks  with increased time at a stabilizing assist-diminished level  Finger Nose Finger Test: Pt able to complete with increased time, pt reports of arm "feeling heavy" therefore compensatory movements at times to reach nose  Motor  Motor Motor: Other (comment)(L hemiparesis ) Motor - Discharge Observations: Pt with increased time for initiation of LUE and LLE movements however improvements over LOS. L lateral lean however self corrected with min VC's and min A  Mobility  Bed Mobility Bed Mobility: Supine to Sit Rolling Right: Supervision/verbal cueing Transfers Sit to Stand: Contact Guard/Touching assist Stand to Sit: Contact Guard/Touching assist  Trunk/Postural Assessment  Cervical Assessment Cervical Assessment: Exceptions to WFL(forward head ) Thoracic Assessment Thoracic Assessment: Exceptions to WFL(kyphotic ) Lumbar Assessment Lumbar Assessment: Exceptions to WFL(posterior pelvic tilt ) Postural Control Postural Control: Deficits on evaluation Trunk Control: L lateral lean at times   Balance Static Sitting Balance Static Sitting - Balance Support: Feet supported Static Sitting - Level of Assistance: 6: Modified independent (Device/Increase time) Dynamic Sitting Balance Dynamic Sitting - Balance Support: During functional activity;Feet supported Dynamic Sitting - Level of Assistance: 5: Stand by assistance Dynamic Sitting Balance - Compensations: Seated in w/c performing LB dressing  Static Standing Balance Static Standing - Balance Support: During functional activity Static Standing - Level of Assistance: 4: Min assist;5: Stand by assistance Dynamic Standing Balance Dynamic Standing - Level of Assistance: 4: Min assist Dynamic Standing - Comments: standing at sink; functional transfers  Extremity/Trunk Assessment RUE Assessment RUE Assessment: Within Functional Limits General Strength Comments: 4/5  LUE Assessment LUE Assessment: Exceptions to [facility] Active Range of Motion  (AROM) Comments: Full ROM with inreased time to reach full ROM due to coordination, motor planning and strength deficits  General Strength Comments: shoulder: 3+/5; elbow 4/5; grip strength present but at diminished level with no finger isolation LUE Body System: Neuro Brunstrum levels for arm and hand: Arm Brunstrum level for arm: Stage IV Movement is deviating from synergy   Aylyn Wenzler 04/14/2018, 12:00 PM

## 2018-04-15 LAB — GLUCOSE, CAPILLARY
Glucose-Capillary: 92 mg/dL (ref 70–99)
Glucose-Capillary: 115 mg/dL — ABNORMAL HIGH (ref 70–99)

## 2018-04-15 MED ORDER — PANTOPRAZOLE SODIUM 40 MG PO TBEC: 40.0000 mg | DELAYED_RELEASE_TABLET | Freq: Every day | ORAL | 0 refills | Status: DC

## 2018-04-15 MED ORDER — GABAPENTIN 100 MG PO CAPS: 100.0000 mg | ORAL_CAPSULE | Freq: Three times a day (TID) | ORAL | 1 refills | Status: DC

## 2018-04-15 MED ORDER — TRAMADOL HCL 50 MG PO TABS: 50.0000 mg | ORAL_TABLET | Freq: Four times a day (QID) | ORAL | 0 refills | Status: DC | PRN

## 2018-04-15 MED ORDER — METFORMIN HCL 850 MG PO TABS: 850.0000 mg | ORAL_TABLET | Freq: Two times a day (BID) | ORAL | 1 refills | Status: DC

## 2018-04-15 MED ORDER — METOPROLOL TARTRATE 50 MG PO TABS: 50.0000 mg | ORAL_TABLET | Freq: Two times a day (BID) | ORAL | 1 refills | Status: DC

## 2018-04-15 MED ORDER — INFLUENZA VAC SPLIT HIGH-DOSE 0.5 ML IM SUSY
0.5000 mL | PREFILLED_SYRINGE | INTRAMUSCULAR | Status: AC | PRN
  Administered 2018-04-15: 0.5 mL via INTRAMUSCULAR

## 2018-04-15 MED ORDER — DILTIAZEM HCL ER COATED BEADS 180 MG PO CP24: 180.0000 mg | ORAL_CAPSULE | Freq: Every day | ORAL | 1 refills | Status: DC

## 2018-04-15 MED ORDER — ATORVASTATIN CALCIUM 20 MG PO TABS: 20.0000 mg | ORAL_TABLET | Freq: Every day | ORAL | 1 refills | Status: DC

## 2018-04-15 NOTE — Progress Notes (Signed)
Social Work  Discharge Note  The overall goal for the admission was met for:   Discharge location: Yes-HOME WITH FAMILY WHO WILL PROVIDE 24 HR CARE  Length of Stay: Yes-29 DAYS  Discharge activity level: Yes-MIN ASSIST WHEELCHAIR LEVEL-MIN-MOD FOR AMBULATION  Home/community participation: Yes  Services provided included: MD, RD, PT, OT, SLP, RN, CM, TR, Pharmacy, Neuropsych and SW  Financial Services: Private Insurance: [facility]  Follow-up services arranged: Home Health: [facility]-PT,OT,SP,AIDE, DME: [facility]-WHEELCHAIR, [place]  and Patient/Family request agency HH: [facility], DME: NO PREF  Comments (or additional information):WIFE AND DAUGHTER ALONG WITH GRANDDAUGHTER HERE FOR FAMILY EDUCATION WED AND IT WENT WELL AWARE HE WILL REQUIRE 24 HR PHYSICAL CARE AND [place] THIS IS THE SAFEST. [facility] IS THERE.   Patient/Family verbalized understanding of follow-up arrangements: Yes  Individual responsible for coordination of the follow-up plan: BARBARA-WIFE & DAUGHTER'S  Confirmed correct DME delivered: Elease Hashimoto 04/15/2018    Elease Hashimoto

## 2018-04-16 ENCOUNTER — Inpatient Hospital Stay: Payer: Medicare Other | Admitting: Internal Medicine

## 2018-04-17 DIAGNOSIS — I69392 Facial weakness following cerebral infarction: Secondary | ICD-10-CM | POA: Diagnosis not present

## 2018-04-17 DIAGNOSIS — E1142 Type 2 diabetes mellitus with diabetic polyneuropathy: Secondary | ICD-10-CM | POA: Diagnosis not present

## 2018-04-17 DIAGNOSIS — I10 Essential (primary) hypertension: Secondary | ICD-10-CM | POA: Diagnosis not present

## 2018-04-17 DIAGNOSIS — E785 Hyperlipidemia, unspecified: Secondary | ICD-10-CM | POA: Diagnosis not present

## 2018-04-17 DIAGNOSIS — I69322 Dysarthria following cerebral infarction: Secondary | ICD-10-CM | POA: Diagnosis not present

## 2018-04-17 DIAGNOSIS — Z7984 Long term (current) use of oral hypoglycemic drugs: Secondary | ICD-10-CM | POA: Diagnosis not present

## 2018-04-17 DIAGNOSIS — I69354 Hemiplegia and hemiparesis following cerebral infarction affecting left non-dominant side: Secondary | ICD-10-CM | POA: Diagnosis not present

## 2018-04-17 DIAGNOSIS — I480 Paroxysmal atrial fibrillation: Secondary | ICD-10-CM | POA: Diagnosis not present

## 2018-04-19 ENCOUNTER — Telehealth: Payer: Self-pay | Admitting: Registered Nurse

## 2018-04-19 ENCOUNTER — Telehealth: Payer: Self-pay

## 2018-04-19 ENCOUNTER — Telehealth: Payer: Self-pay | Admitting: *Deleted

## 2018-04-19 ENCOUNTER — Other Ambulatory Visit: Payer: Self-pay

## 2018-04-19 DIAGNOSIS — I69354 Hemiplegia and hemiparesis following cerebral infarction affecting left non-dominant side: Secondary | ICD-10-CM | POA: Diagnosis not present

## 2018-04-19 DIAGNOSIS — Z7984 Long term (current) use of oral hypoglycemic drugs: Secondary | ICD-10-CM | POA: Diagnosis not present

## 2018-04-19 DIAGNOSIS — I69322 Dysarthria following cerebral infarction: Secondary | ICD-10-CM | POA: Diagnosis not present

## 2018-04-19 DIAGNOSIS — I69392 Facial weakness following cerebral infarction: Secondary | ICD-10-CM | POA: Diagnosis not present

## 2018-04-19 DIAGNOSIS — E1142 Type 2 diabetes mellitus with diabetic polyneuropathy: Secondary | ICD-10-CM | POA: Diagnosis not present

## 2018-04-19 DIAGNOSIS — I10 Essential (primary) hypertension: Secondary | ICD-10-CM | POA: Diagnosis not present

## 2018-04-19 DIAGNOSIS — E785 Hyperlipidemia, unspecified: Secondary | ICD-10-CM | POA: Diagnosis not present

## 2018-04-19 DIAGNOSIS — I480 Paroxysmal atrial fibrillation: Secondary | ICD-10-CM | POA: Diagnosis not present

## 2018-04-19 NOTE — Telephone Encounter (Signed)
Transitional Care call Transitional Care Questions answered by [name]  Patient name: Marcus Beasley  DOB: [birth year] 1. Are you/is patient experiencing any problems since coming home? No a. Are there any questions regarding any aspect of care? No 2. Are there any questions regarding medications administration/dosing? No a. Are meds being taken as prescribed? Yes b. "Patient should review meds with caller to confirm"  Medication List Reviewed 3. Have there been any falls? No 4. Has Home Health been to the house and/or have they contacted you? Yes, [facility] a. If not, have you tried to contact them? NA b. Can we help you contact them? NA 5. Are bowels and bladder emptying properly? Yes a. Are there any unexpected incontinence issues? No b. If applicable, is patient following bowel/bladder programs? NA 6. Any fevers, problems with breathing, unexpected pain? No 7. Are there any skin problems or new areas of breakdown? No 8. Has the patient/family member arranged specialty MD follow up (ie cardiology/neurology/renal/surgical/etc.)?  All, except Neurology. She was Given the number for [facility] Neurology, she will call today she reports.  a. Can we help arrange? NA 9. Does the patient need any other services or support that we can help arrange? No 10. Are caregivers following through as expected in assisting the patient? Yes 11. Has the patient quit smoking, drinking alcohol, or using drugs as recommended? Mrs. Koman reports Mr. Bruington doesn't smoke, drink alcohol or use illicit drugs.  Appointment date/time 04/28/2018  arrival time 11:40 for 12:00 appointment with Dr. Posey Pronto. At [place]

## 2018-04-19 NOTE — Patient Outreach (Signed)
[facility]) Care Management  04/19/2018  Marcus Beasley 06/29/1945 [id number]     EMMI-Stroke RED ON EMMI ALERT Day # 1 Date: 04/16/18 Red Alert Reason: " Scheduled a follow up appt? No"   Outreach attempt # 1 to patient. Spoke with patient who reports things are going fine since he has returned home. Reviewed and addressed red alert with patient. Patient confirmed that he has PCP follow up appt on this Wednesday.He denies any issues with transportation. He voices that he has all his meds and understands how and when to take them. He reported that [facility] has already been out to see him and will be back out again today. He denies any further RN CM needs or concerns at this time. Advised patient that they would continue to get automated EMMI-Stroke post discharge calls to assess how they are doing following recent hospitalization and will receive a call from a nurse if any of their responses were abnormal. Patient voiced understanding and was appreciative of f/u call.      Plan: RN CM will close case at this time as no further interventions needed.    Enzo Montgomery, RN,BSN,CCM [place] [facility] Telephonic Care Management Coordinator Direct Phone: [phone] Toll Free: [phone] Fax: [phone]

## 2018-04-19 NOTE — Telephone Encounter (Signed)
Called patient for hospital follow up. Left message for return call.

## 2018-04-19 NOTE — Telephone Encounter (Signed)
Wells Guiles, PT, [facility] left a message requesting verbal orders for HHPT 1week1 followed by [facility] Fill[facility] and [facility] 2week3. Medical record reviewed. Social work note reviewed.  Verbal orders given per office protocol.

## 2018-04-20 ENCOUNTER — Inpatient Hospital Stay: Payer: Medicare Other | Admitting: Internal Medicine

## 2018-04-20 ENCOUNTER — Telehealth: Payer: Self-pay | Admitting: *Deleted

## 2018-04-20 DIAGNOSIS — I69392 Facial weakness following cerebral infarction: Secondary | ICD-10-CM | POA: Diagnosis not present

## 2018-04-20 DIAGNOSIS — Z7984 Long term (current) use of oral hypoglycemic drugs: Secondary | ICD-10-CM | POA: Diagnosis not present

## 2018-04-20 DIAGNOSIS — I10 Essential (primary) hypertension: Secondary | ICD-10-CM | POA: Diagnosis not present

## 2018-04-20 DIAGNOSIS — E785 Hyperlipidemia, unspecified: Secondary | ICD-10-CM | POA: Diagnosis not present

## 2018-04-20 DIAGNOSIS — I480 Paroxysmal atrial fibrillation: Secondary | ICD-10-CM | POA: Diagnosis not present

## 2018-04-20 DIAGNOSIS — I69354 Hemiplegia and hemiparesis following cerebral infarction affecting left non-dominant side: Secondary | ICD-10-CM | POA: Diagnosis not present

## 2018-04-20 DIAGNOSIS — E1142 Type 2 diabetes mellitus with diabetic polyneuropathy: Secondary | ICD-10-CM | POA: Diagnosis not present

## 2018-04-20 DIAGNOSIS — I69322 Dysarthria following cerebral infarction: Secondary | ICD-10-CM | POA: Diagnosis not present

## 2018-04-20 NOTE — Telephone Encounter (Signed)
Sonia Baller, ST, [facility] left a message asking for verbal orders for HHST 2week2.  Medical record reviewed. Social work note reviewed.  Verbal orders given per office protocol.

## 2018-04-21 ENCOUNTER — Encounter: Payer: Self-pay | Admitting: Internal Medicine

## 2018-04-21 ENCOUNTER — Ambulatory Visit (INDEPENDENT_AMBULATORY_CARE_PROVIDER_SITE_OTHER): Payer: Medicare Other | Admitting: Internal Medicine

## 2018-04-21 VITALS — BP 138/84 | HR 71 | Temp 98.2°F | Resp 16 | Ht 71.0 in

## 2018-04-21 DIAGNOSIS — I480 Paroxysmal atrial fibrillation: Secondary | ICD-10-CM

## 2018-04-21 DIAGNOSIS — E119 Type 2 diabetes mellitus without complications: Secondary | ICD-10-CM | POA: Diagnosis not present

## 2018-04-21 DIAGNOSIS — E785 Hyperlipidemia, unspecified: Secondary | ICD-10-CM | POA: Diagnosis not present

## 2018-04-21 DIAGNOSIS — I69354 Hemiplegia and hemiparesis following cerebral infarction affecting left non-dominant side: Secondary | ICD-10-CM | POA: Diagnosis not present

## 2018-04-21 DIAGNOSIS — E1142 Type 2 diabetes mellitus with diabetic polyneuropathy: Secondary | ICD-10-CM | POA: Diagnosis not present

## 2018-04-21 DIAGNOSIS — I10 Essential (primary) hypertension: Secondary | ICD-10-CM | POA: Diagnosis not present

## 2018-04-21 DIAGNOSIS — I69322 Dysarthria following cerebral infarction: Secondary | ICD-10-CM | POA: Diagnosis not present

## 2018-04-21 DIAGNOSIS — I69392 Facial weakness following cerebral infarction: Secondary | ICD-10-CM | POA: Diagnosis not present

## 2018-04-21 DIAGNOSIS — Z7984 Long term (current) use of oral hypoglycemic drugs: Secondary | ICD-10-CM | POA: Diagnosis not present

## 2018-04-21 NOTE — Patient Instructions (Signed)
  GO TO THE FRONT DESK Schedule your next appointment for a  Check up in 3 months   Check the  blood pressure 2 or 3 times a   weekly   Be sure your blood pressure is between 110/65 and  135/85. If it is consistently higher or lower, let me know

## 2018-04-21 NOTE — Progress Notes (Signed)
Subjective:    Patient ID: Marcus Beasley, male    DOB: [birth year], 72 y.o.   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 Carotid Dopplers with no ICA stenosis Echo with EF of 70%. During the admission develop paroxysmal A. fib with RVR, had IV Cardizem, cardiology consulted, rate control with metoprolol. Subsequently was admitted to the rehab service. Subsequently he was discharged home w/ the recommendation to see neurology, PCP and cardiology   Most recent labs reviewed: Last potassium 4.1, creatinine 1.0, LFTs normal, CBC normal. A1c was 6.4 last month. Last cholesterol satisfactory.  Wt Readings from Last 3 Encounters:  03/17/18 223 lb 5.2 oz (101.3 kg)  03/12/18 186 lb 11.7 oz (84.7 kg)  11/11/17 191 lb (86.6 kg)     Review of Systems  Since he left rehab he is at home and feeling well. He remains in good spirits, appetite is okay but not the best. I noted some weight loss, the family agrees. Denies depression at this point. Denies chest pain, difficulty breathing, palpitation.  No lower extremity edema.  Past Medical History:  Diagnosis Date  . BPH (benign prostatic hyperplasia)    (-) Bx 2015  . Diabetes mellitus without complication ([place])   . Elevated PSA    Prostate Bx in 06/2013 was benign  . HTN (hypertension)   . Hyperlipidemia   . Macular degeneration, age related     Past Surgical History:  Procedure Laterality Date  . ABCESS DRAINAGE     abdomen- 26 day hospitalization 1968  . COLONOSCOPY  2016  . HERNIA REPAIR  summer '11   umbilical, dr Ninfa [place]   . POLYPECTOMY    . PROSTATE BIOPSY  06-2013 , 07-2017   (-), (-)    Social History   Socioeconomic History  . Marital status: Married    Spouse name: Not on file  . Number of children: 3  . Years of  education: 58  . Highest education level: Not on file  Occupational History  . 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 Financial resource strain: Not on file  . Food insecurity:    Worry: Not on file    Inability: Not on file  . Transportation needs:    Medical: Not on file    Non-medical: Not on file  Tobacco Use  . Smoking status: Never Smoker  . Smokeless tobacco: Never Used  Substance and Sexual Activity  . Alcohol use: Yes    Comment: occ. beer  . Drug use: No  . Sexual activity: Yes    Partners: Female  Lifestyle  . Physical activity:    Days per week: Not on file    Minutes per session: Not on file  . Stress: Not on file  Relationships  . Social connections:    Talks on phone: Not on file    Gets together: Not on file    Attends religious service: Not on file    Active member of club or organization: Not on file    Attends meetings of clubs or organizations: Not on file    Relationship status: Not on file  . Intimate partner violence:    Fear of current or ex partner: Not on file    Emotionally abused: Not  on file    Physically abused: Not on file    Forced sexual activity: Not on file  Other Topics Concern  . 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 2 dtrs , 1 son - homicide. 5 grandchildren.        Allergies as of 04/21/2018   No Known Allergies     Medication List        Accurate as of 04/21/18 11:18 AM. Always use your most recent med list.          acetaminophen 325 MG tablet Commonly known as:  TYLENOL Take 2 tablets (650 mg total) by mouth every 4 (four) hours as needed for mild pain (or temp > 37.5 C (99.5 F)).   atorvastatin 20 MG tablet Commonly known as:  LIPITOR Take 1 tablet (20 mg total) by mouth daily.   diltiazem 180 MG 24 hr capsule Commonly known as:  CARDIZEM CD Take 1 capsule (180 mg total) by mouth daily.   gabapentin 100 MG  capsule Commonly known as:  NEURONTIN Take 1 capsule (100 mg total) by mouth 3 (three) times daily.   metFORMIN 850 MG tablet Commonly known as:  GLUCOPHAGE Take 1 tablet (850 mg total) by mouth 2 (two) times daily with a meal.   metoprolol tartrate 50 MG tablet Commonly known as:  LOPRESSOR Take 1 tablet (50 mg total) by mouth 2 (two) times daily.   multivitamin with minerals Tabs tablet Take 1 tablet by mouth daily. Centrum   pantoprazole 40 MG tablet Commonly known as:  PROTONIX Take 1 tablet (40 mg total) by mouth daily.   senna-docusate 8.6-50 MG tablet Commonly known as:  Senokot-S Take 1 tablet by mouth 2 (two) times daily.   traMADol 50 MG tablet Commonly known as:  ULTRAM Take 1 tablet (50 mg total) by mouth every 6 (six) hours as needed for moderate pain.          Objective:   Physical Exam BP 138/84 (BP Location: Right Arm, Patient Position: Sitting, Cuff Size: Normal)   Pulse 71   Temp 98.2 F (36.8 C) (Oral)   Resp 16   Ht 5\' 11"  (1.803 m)   SpO2 98%   BMI 31.15 kg/m   General:   Well developed, NAD, see BMI.  Unable to weight the patient, he declined to step in the scale due to safety concerns.  I agree. HEENT:  Normocephalic . Face symmetric, atraumatic Lungs:  CTA B Normal respiratory effort, no intercostal retractions, no accessory muscle use. Heart: RRR,  no murmur.  No pretibial edema bilaterally  Skin: Not pale. Not jaundice Neurologic:  alert & oriented X3.  Speech normal, gait not tested, sits in wheelchair.   Motor exam: 5/5 on the right L upper and lower extremities 3-4/5 Psych--  Cognition and judgment appear intact.  Cooperative with normal attention span and concentration.  Behavior appropriate. No anxious or depressed appearing.      Assessment & Plan:   Assessment DM  ---->  DX 07-2015, A1c 8.0; started metformin Neuropathy, mild.  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 Carotid Dopplers with no ICA stenosis Echo with EF of  70%. During the admission develop paroxysmal A. fib with RVR, had IV Cardizem, cardiology consulted, rate control with metoprolol. Subsequently was admitted to the rehab service Was discharged home 6 days ago, currently doing well, fortunately he remains optimistic, no depression. Good compliance with medications. On tramadol: Mostly for paresthesias at the left upper and lower extremities DM: Last A1c satisfactory, continue with metformin.  Ambulatory CBGs in the low 100s HTN: On metoprolol, Cardizem, controlled.  Ambulatory BPs 130/80 or less Hyperlipidemia: Well controlled, continue Lipitor Paroxysmal A. fib, he seems to be in sinus rhythm today on clinical grounds, not anticoagulated due to recent hemorrhagic stroke. He has a follow-up scheduled with neurology and cardiology. Social: Lives at home with wife, neither of them drive, granddaughter helps them. RTC 3 months

## 2018-04-21 NOTE — Progress Notes (Signed)
Pre visit review using our clinic review tool, if applicable. No additional management support is needed unless otherwise documented below in the visit note. 

## 2018-04-22 DIAGNOSIS — I69354 Hemiplegia and hemiparesis following cerebral infarction affecting left non-dominant side: Secondary | ICD-10-CM | POA: Diagnosis not present

## 2018-04-22 DIAGNOSIS — E785 Hyperlipidemia, unspecified: Secondary | ICD-10-CM | POA: Diagnosis not present

## 2018-04-22 DIAGNOSIS — E1142 Type 2 diabetes mellitus with diabetic polyneuropathy: Secondary | ICD-10-CM | POA: Diagnosis not present

## 2018-04-22 DIAGNOSIS — I69392 Facial weakness following cerebral infarction: Secondary | ICD-10-CM | POA: Diagnosis not present

## 2018-04-22 DIAGNOSIS — I69322 Dysarthria following cerebral infarction: Secondary | ICD-10-CM | POA: Diagnosis not present

## 2018-04-22 DIAGNOSIS — I10 Essential (primary) hypertension: Secondary | ICD-10-CM | POA: Diagnosis not present

## 2018-04-22 DIAGNOSIS — Z7984 Long term (current) use of oral hypoglycemic drugs: Secondary | ICD-10-CM | POA: Diagnosis not present

## 2018-04-22 DIAGNOSIS — I480 Paroxysmal atrial fibrillation: Secondary | ICD-10-CM | POA: Diagnosis not present

## 2018-04-22 NOTE — Assessment & Plan Note (Signed)
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 Carotid Dopplers with no ICA stenosis Echo with EF of 70%. During the admission develop paroxysmal A. fib with RVR, had IV Cardizem, cardiology consulted, rate control with metoprolol. Subsequently was admitted to the rehab service Was discharged home 6 days ago, currently doing well, fortunately he remains optimistic, no depression. Good compliance with medications. On tramadol: Mostly for paresthesias at the left upper and lower extremities DM: Last A1c satisfactory, continue with metformin.  Ambulatory CBGs in the low 100s HTN: On metoprolol, Cardizem, controlled.  Ambulatory BPs 130/80 or less Hyperlipidemia: Well controlled, continue Lipitor Paroxysmal A. fib, he seems to be in sinus rhythm today on clinical grounds, not anticoagulated due to recent hemorrhagic stroke. He has a follow-up scheduled with neurology and cardiology. Social: Lives at home with wife, neither of them drive, granddaughter helps them. RTC 3 months

## 2018-04-26 ENCOUNTER — Ambulatory Visit: Payer: Medicare Other | Admitting: Physician Assistant

## 2018-04-26 ENCOUNTER — Encounter: Payer: Self-pay | Admitting: Physician Assistant

## 2018-04-26 VITALS — BP 144/86 | HR 68 | Ht 71.0 in | Wt 172.0 lb

## 2018-04-26 DIAGNOSIS — I69392 Facial weakness following cerebral infarction: Secondary | ICD-10-CM | POA: Diagnosis not present

## 2018-04-26 DIAGNOSIS — E119 Type 2 diabetes mellitus without complications: Secondary | ICD-10-CM

## 2018-04-26 DIAGNOSIS — I10 Essential (primary) hypertension: Secondary | ICD-10-CM | POA: Diagnosis not present

## 2018-04-26 DIAGNOSIS — E1142 Type 2 diabetes mellitus with diabetic polyneuropathy: Secondary | ICD-10-CM | POA: Diagnosis not present

## 2018-04-26 DIAGNOSIS — I69354 Hemiplegia and hemiparesis following cerebral infarction affecting left non-dominant side: Secondary | ICD-10-CM | POA: Diagnosis not present

## 2018-04-26 DIAGNOSIS — I480 Paroxysmal atrial fibrillation: Secondary | ICD-10-CM | POA: Diagnosis not present

## 2018-04-26 DIAGNOSIS — I69322 Dysarthria following cerebral infarction: Secondary | ICD-10-CM | POA: Diagnosis not present

## 2018-04-26 DIAGNOSIS — Z7984 Long term (current) use of oral hypoglycemic drugs: Secondary | ICD-10-CM | POA: Diagnosis not present

## 2018-04-26 DIAGNOSIS — Z8673 Personal history of transient ischemic attack (TIA), and cerebral infarction without residual deficits: Secondary | ICD-10-CM | POA: Diagnosis not present

## 2018-04-26 DIAGNOSIS — E785 Hyperlipidemia, unspecified: Secondary | ICD-10-CM | POA: Diagnosis not present

## 2018-04-26 MED ORDER — METOPROLOL TARTRATE 50 MG PO TABS: 50.0000 mg | ORAL_TABLET | Freq: Two times a day (BID) | ORAL | 1 refills | Status: DC

## 2018-04-26 MED ORDER — DILTIAZEM HCL ER COATED BEADS 240 MG PO CP24: 240.0000 mg | ORAL_CAPSULE | Freq: Every day | ORAL | 1 refills | Status: DC

## 2018-04-26 NOTE — Patient Instructions (Signed)
Medication Instructions:  Increase Diltaizem to 240 mg daily. Refilled Metoprolol medication. If you need a refill on your cardiac medications before your next appointment, please call your pharmacy.   Lab work: None Ordered. If you have labs (blood work) drawn today and your tests are completely normal, you will receive your results only by: Marland Kitchen MyChart Message (if you have MyChart) OR . A paper copy in the mail If you have any lab test that is abnormal or we need to change your treatment, we will call you to review the results.  Testing/Procedures: None Ordered.  Follow-Up: At [facility], you and your health needs are our priority.  As part of our continuing mission to provide you with exceptional heart care, we have created designated Provider Care Teams.  These Care Teams include your primary Cardiologist (physician) and Advanced Practice Providers (APPs -  Physician Assistants and Nurse Practitioners) who all work together to provide you with the care you need, when you need it. You will need a follow up appointment in 2-3 months.  Please call our office 2 months in advance to schedule this appointment.  You may see Dr.Jordan or one of the following Advanced Practice Providers on your designated Care Team: Almyra Deforest, [place] . Fabian Sharp, PA-C  Any Other Special Instructions Will Be Listed Below (If Applicable). None Ordered.

## 2018-04-26 NOTE — Progress Notes (Signed)
Cardiology Office Note    Date:  04/28/2018   ID:  Marcus Beasley, DOB 1946-04-09, MRN [id number]  PCP:  Colon Branch, MD  Cardiologist:  Dr. Martinique  Chief Complaint  Patient presents with  . Follow-up    seen for Dr. Martinique.     History of Present Illness:  Marcus Beasley is a 72 y.o. male with past medical history of hypertension, DM 2 and hyperlipidemia who presented on 03/12/2018 was facial droop, slurred speech and left leg weakness.  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  This was attributed to uncontrolled high blood pressure.  During the admission, patient developed paroxysmal atrial fibrillation and was placed on 180 mg daily of diltiazem. [place] Vasc score of 5. He was not placed on systemic anticoagulation due to the brain bleed.  Patient was eventually discharged to inpatient rehab and was recently released from inpatient rehab on 04/14/2018.  Patient presents today for cardiology office visit.  He has lost quite a bit of weight since earlier this year.  He denies any chest pain or shortness of breath.  He does have pain radiating down the left arm, this is likely neuropathic pain.  He continued to have weakness in the left upper extremity, the weakness in the left lower extremity has improved.  He is doing physical therapy at home.  He needs refill on his metoprolol.  His blood pressure has been borderline elevated, he is outside the window for permissive hypertension, I will increase diltiazem to 240 mg daily.  He has upcoming visit with neurology, I will check with neurology with the earliest time from neurology perspective that the patient can start on a systemic anticoagulation such as Xarelto or Eliquis.   Past Medical History:  Diagnosis Date  . BPH (benign prostatic hyperplasia)    (-) Bx 2015  . Diabetes mellitus without complication ([place])   . Elevated PSA    Prostate Bx in 06/2013 was benign  . HTN (hypertension)   . Hyperlipidemia   .  Macular degeneration, age related     Past Surgical History:  Procedure Laterality Date  . ABCESS DRAINAGE     abdomen- 26 day hospitalization 1968  . COLONOSCOPY  2016  . HERNIA REPAIR  summer '11   umbilical, dr Ninfa [place]   . POLYPECTOMY    . PROSTATE BIOPSY  06-2013 , 07-2017   (-), (-)    Current Medications: Outpatient Medications Prior to Visit  Medication Sig Dispense Refill  . atorvastatin (LIPITOR) 20 MG tablet Take 1 tablet (20 mg total) by mouth daily. 90 tablet 1  . gabapentin (NEURONTIN) 100 MG capsule Take 1 capsule (100 mg total) by mouth 3 (three) times daily. 90 capsule 1  . metFORMIN (GLUCOPHAGE) 850 MG tablet Take 1 tablet (850 mg total) by mouth 2 (two) times daily with a meal. 180 tablet 1  . Multiple Vitamin (MULTIVITAMIN WITH MINERALS) TABS tablet Take 1 tablet by mouth daily. Centrum    . Multiple Vitamins-Minerals (PRESERVISION/LUTEIN PO) Take by mouth.    Marland Kitchen acetaminophen (TYLENOL) 325 MG tablet Take 2 tablets (650 mg total) by mouth every 4 (four) hours as needed for mild pain (or temp > 37.5 C (99.5 F)). 30 tablet 0  . diltiazem (CARDIZEM CD) 180 MG 24 hr capsule Take 1 capsule (180 mg total) by mouth daily. 30 capsule 1  . metoprolol tartrate (LOPRESSOR) 50 MG tablet Take 1 tablet (50 mg total) by mouth 2 (two)  times daily. 30 tablet 1  . traMADol (ULTRAM) 50 MG tablet Take 1 tablet (50 mg total) by mouth every 6 (six) hours as needed for moderate pain. (Patient not taking: Reported on 04/26/2018) 20 tablet 0  . pantoprazole (PROTONIX) 40 MG tablet Take 1 tablet (40 mg total) by mouth daily. 30 tablet 0  . senna-docusate (SENOKOT-S) 8.6-50 MG tablet Take 1 tablet by mouth 2 (two) times daily. 30 tablet 0   No facility-administered medications prior to visit.      Allergies:   Patient has no known allergies.   Social History   Socioeconomic History  . Marital status: Married    Spouse name: Not on file  . Number of children: 3  . Years of education: 50    . Highest education level: Not on file  Occupational History  . 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 Financial resource strain: Not on file  . Food insecurity:    Worry: Not on file    Inability: Not on file  . Transportation needs:    Medical: Not on file    Non-medical: Not on file  Tobacco Use  . Smoking status: Never Smoker  . Smokeless tobacco: Never Used  Substance and Sexual Activity  . Alcohol use: Yes    Comment: occ. beer  . Drug use: No  . Sexual activity: Yes    Partners: Female  Lifestyle  . Physical activity:    Days per week: Not on file    Minutes per session: Not on file  . Stress: Not on file  Relationships  . Social connections:    Talks on phone: Not on file    Gets together: Not on file    Attends religious service: Not on file    Active member of club or organization: Not on file    Attends meetings of clubs or organizations: Not on file    Relationship status: Not on file  Other Topics Concern  . 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 2 dtrs , 1 son - homicide. 5 grandchildren.       Family History:  The patient's family history includes Cancer - Other in his sister; Diabetes in his maternal grandmother; Heart attack in his father; Heart disease in his brother and sister; Kidney disease in his brother; Leukemia in his mother.   ROS:   Please see the history of present illness.    ROS All other systems reviewed and are negative.   PHYSICAL EXAM:   VS:  BP (!) 144/86   Pulse 68   Ht 5\' 11"  (1.803 m)   Wt 172 lb (78 kg)   BMI 23.99 kg/m    GEN: Well nourished, well developed, in no acute distress  HEENT: normal  Neck: no JVD, carotid bruits, or masses Cardiac: RRR; no murmurs, rubs, or gallops,no edema  Respiratory:  clear to auscultation bilaterally, normal work of breathing GI: soft, nontender, nondistended, + BS MS: no deformity or  atrophy  Skin: warm and dry, no rash Neuro:  Alert and Oriented x 3, Strength and sensation are intact Psych: euthymic mood, full affect  Wt Readings from Last 3 Encounters:  04/26/18 172 lb (78 kg)  03/17/18 223 lb 5.2 oz (101.3 kg)  03/12/18 186 lb 11.7 oz (84.7 kg)      Studies/Labs Reviewed:   EKG:  EKG is not ordered today.  Recent Labs: 03/13/2018: TSH 0.549 03/18/2018: ALT 13 04/09/2018: BUN 15; Creatinine, Ser 1.00; Hemoglobin 13.4; Platelets 285; Potassium 4.1; Sodium 136   Lipid Panel    Component Value Date/Time   CHOL 165 03/13/2018 0400   TRIG 153 (H) 03/13/2018 0400   HDL 40 (L) 03/13/2018 0400   CHOLHDL 4.1 03/13/2018 0400   VLDL 31 03/13/2018 0400   LDLCALC 94 03/13/2018 0400    Additional studies/ records that were reviewed today include:   Echo 03/13/2018 LV EF: 65% -   70% Study Conclusions  - Left ventricle: The cavity size was normal. Wall thickness was   increased in a pattern of mild LVH. Systolic function was   vigorous. The estimated ejection fraction was in the range of 65%   to 70%. Wall motion was normal; there were no regional wall   motion abnormalities. - Aortic valve: There was trivial regurgitation.    ASSESSMENT:    1. PAF (paroxysmal atrial fibrillation) ([place])   2. H/O: CVA (cerebrovascular accident)   3. Essential hypertension   4. Hyperlipidemia, unspecified hyperlipidemia type   5. Controlled type 2 diabetes mellitus without complication, without long-term current use of insulin (HCC)      PLAN:  In order of problems listed above:  1. PAF: Increase diltiazem to 240 mg daily to better control the heart rate.  She is not on systemic anticoagulation given the recent hemorrhagic stroke.  We will need to check with neurology the earliest date to start on blood thinner.  2. History of CVA: Recent hemorrhagic stroke involving right thalamus.  Continue to have left upper extremity weakness.  Left lower extremity weakness has  improved.  3. Hypertension: Blood pressure mildly elevated, increase diltiazem to 240 mg daily  4. Hyperlipidemia: On Lipitor 20 mg daily  5. DM2: Managed by primary care provider.  On metformin.   Medication Adjustments/Labs and Tests Ordered: Current medicines are reviewed at length with the patient today.  Concerns regarding medicines are outlined above.  Medication changes, Labs and Tests ordered today are listed in the Patient Instructions below. Patient Instructions  Medication Instructions:  Increase Diltaizem to 240 mg daily. Refilled Metoprolol medication. If you need a refill on your cardiac medications before your next appointment, please call your pharmacy.   Lab work: None Ordered. If you have labs (blood work) drawn today and your tests are completely normal, you will receive your results only by: Marland Kitchen MyChart Message (if you have MyChart) OR . A paper copy in the mail If you have any lab test that is abnormal or we need to change your treatment, we will call you to review the results.  Testing/Procedures: None Ordered.  Follow-Up: At [facility], you and your health needs are our priority.  As part of our continuing mission to provide you with exceptional heart care, we have created designated Provider Care Teams.  These Care Teams include your primary Cardiologist (physician) and Advanced Practice Providers (APPs -  Physician Assistants and Nurse Practitioners) who all work together to provide you with the care you need, when you need it. You will need a follow up appointment in 2-3 months.  Please call our office 2 months in advance to schedule this appointment.  You may see Dr.Jordan or one of the following Advanced Practice Providers on your designated Care Team: Almyra Deforest, [place] . Fabian Sharp, PA-C  Any Other Special Instructions Will Be Listed Below (If Applicable). None Ordered.      Signed, Almyra Deforest,  PA  04/28/2018 9:32 AM    [facility]  [facility], [place], [place]  [address] Phone: [phone]; Fax: [phone]

## 2018-04-27 ENCOUNTER — Telehealth: Payer: Self-pay | Admitting: *Deleted

## 2018-04-27 DIAGNOSIS — Z7984 Long term (current) use of oral hypoglycemic drugs: Secondary | ICD-10-CM | POA: Diagnosis not present

## 2018-04-27 DIAGNOSIS — I69354 Hemiplegia and hemiparesis following cerebral infarction affecting left non-dominant side: Secondary | ICD-10-CM | POA: Diagnosis not present

## 2018-04-27 DIAGNOSIS — E785 Hyperlipidemia, unspecified: Secondary | ICD-10-CM | POA: Diagnosis not present

## 2018-04-27 DIAGNOSIS — I69322 Dysarthria following cerebral infarction: Secondary | ICD-10-CM | POA: Diagnosis not present

## 2018-04-27 DIAGNOSIS — I10 Essential (primary) hypertension: Secondary | ICD-10-CM | POA: Diagnosis not present

## 2018-04-27 DIAGNOSIS — I480 Paroxysmal atrial fibrillation: Secondary | ICD-10-CM | POA: Diagnosis not present

## 2018-04-27 DIAGNOSIS — I69392 Facial weakness following cerebral infarction: Secondary | ICD-10-CM | POA: Diagnosis not present

## 2018-04-27 DIAGNOSIS — E1142 Type 2 diabetes mellitus with diabetic polyneuropathy: Secondary | ICD-10-CM | POA: Diagnosis not present

## 2018-04-27 NOTE — Telephone Encounter (Signed)
[place] [facility] called to get approval for OT 2wk3.  Approval given.

## 2018-04-28 ENCOUNTER — Encounter: Payer: Self-pay | Admitting: Physician Assistant

## 2018-04-28 ENCOUNTER — Other Ambulatory Visit: Payer: Self-pay

## 2018-04-28 ENCOUNTER — Encounter: Payer: Self-pay | Admitting: Physical Medicine & Rehabilitation

## 2018-04-28 ENCOUNTER — Encounter: Payer: Medicare Other | Attending: Physical Medicine & Rehabilitation | Admitting: Physical Medicine & Rehabilitation

## 2018-04-28 VITALS — BP 142/88 | HR 67 | Ht 71.0 in | Wt 172.0 lb

## 2018-04-28 DIAGNOSIS — R269 Unspecified abnormalities of gait and mobility: Secondary | ICD-10-CM

## 2018-04-28 DIAGNOSIS — M792 Neuralgia and neuritis, unspecified: Secondary | ICD-10-CM

## 2018-04-28 DIAGNOSIS — I69392 Facial weakness following cerebral infarction: Secondary | ICD-10-CM | POA: Diagnosis not present

## 2018-04-28 DIAGNOSIS — Z7984 Long term (current) use of oral hypoglycemic drugs: Secondary | ICD-10-CM

## 2018-04-28 DIAGNOSIS — E1142 Type 2 diabetes mellitus with diabetic polyneuropathy: Secondary | ICD-10-CM

## 2018-04-28 DIAGNOSIS — E119 Type 2 diabetes mellitus without complications: Secondary | ICD-10-CM | POA: Insufficient documentation

## 2018-04-28 DIAGNOSIS — I10 Essential (primary) hypertension: Secondary | ICD-10-CM | POA: Insufficient documentation

## 2018-04-28 DIAGNOSIS — I610 Nontraumatic intracerebral hemorrhage in hemisphere, subcortical: Secondary | ICD-10-CM

## 2018-04-28 DIAGNOSIS — I69354 Hemiplegia and hemiparesis following cerebral infarction affecting left non-dominant side: Secondary | ICD-10-CM | POA: Diagnosis not present

## 2018-04-28 DIAGNOSIS — E785 Hyperlipidemia, unspecified: Secondary | ICD-10-CM

## 2018-04-28 DIAGNOSIS — R419 Unspecified symptoms and signs involving cognitive functions and awareness: Secondary | ICD-10-CM

## 2018-04-28 DIAGNOSIS — I69322 Dysarthria following cerebral infarction: Secondary | ICD-10-CM | POA: Diagnosis not present

## 2018-04-28 DIAGNOSIS — I480 Paroxysmal atrial fibrillation: Secondary | ICD-10-CM

## 2018-04-28 MED ORDER — GABAPENTIN 100 MG PO CAPS: 200.0000 mg | ORAL_CAPSULE | Freq: Three times a day (TID) | ORAL | 1 refills | Status: DC

## 2018-04-28 NOTE — Progress Notes (Signed)
Subjective:    Patient ID: Marcus Beasley, male    DOB: Apr 29, 1946, 72 y.o.   MRN: [id number]  HPI 72 year old right-handed male with history of diabetes mellitus, hypertension presents for transitional care management after receiving CIR for right thalamic hemorrhage.   DATE OF ADMISSION: 03/17/2018 DATE OF DISCHARGE: 04/15/2018   Family supplements history. At discharge, he was instructed to follow up with Neurology, who he sees in 2 weeks. He saw Cards yesterday. He saw PCP. He still has burning pain. CBGs have been well controlled. BP is relatively controlled. Denies falls.  Therapies: 2/week. DME: Independently purchased toilet seat/shower chair Mobility: Walker in home, wheelchair in community  Pain Inventory Average Pain 6 Pain Right Now 6 My pain is aching and stiffness  In the last 24 hours, has pain interfered with the following? General activity 2 Relation with others 0 Enjoyment of life 0 What TIME of day is your pain at its worst? evening Sleep (in general) Fair  Pain is worse with: some activites Pain improves with: medication Relief from Meds: 5  Mobility walk with assistance use a walker ability to climb steps?  yes do you drive?  no use a wheelchair transfers alone  Function retired I need assistance with the following:  dressing, bathing, household duties and shopping  Neuro/Psych weakness numbness trouble walking  Prior Studies Any changes since last visit?  no  Physicians involved in your care Any changes since last visit?  no   Family History  Problem Relation Age of Onset  . Leukemia Mother   . Heart attack Father        MI age 67  . Heart disease Sister        age 51  . Cancer - Other Sister        type  . Heart disease Brother        age 34  . Kidney disease Brother        HD  . Diabetes Maternal Grandmother   . Prostate cancer Neg Hx   . Colon cancer Neg Hx   . Esophageal cancer Neg Hx   . Rectal cancer Neg Hx   .  Stomach cancer Neg Hx    Social History   Socioeconomic History  . Marital status: Married    Spouse name: Not on file  . Number of children: 3  . Years of education: 54  . Highest education level: Not on file  Occupational History  . 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 Financial resource strain: Not on file  . Food insecurity:    Worry: Not on file    Inability: Not on file  . Transportation needs:    Medical: Not on file    Non-medical: Not on file  Tobacco Use  . Smoking status: Never Smoker  . Smokeless tobacco: Never Used  Substance and Sexual Activity  . Alcohol use: Yes    Comment: occ. beer  . Drug use: No  . Sexual activity: Yes    Partners: Female  Lifestyle  . Physical activity:    Days per week: Not on file    Minutes per session: Not on file  . Stress: Not on file  Relationships  . Social connections:    Talks on phone: Not on file    Gets together: Not on file    Attends religious service: Not on file    Active member of club or  organization: Not on file    Attends meetings of clubs or organizations: Not on file    Relationship status: Not on file  Other Topics Concern  . 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 2 dtrs , 1 son - homicide. 5 grandchildren.     Past Surgical History:  Procedure Laterality Date  . ABCESS DRAINAGE     abdomen- 26 day hospitalization 1968  . COLONOSCOPY  2016  . HERNIA REPAIR  summer '11   umbilical, dr Ninfa Linden   . POLYPECTOMY    . PROSTATE BIOPSY  06-2013 , 07-2017   (-), (-)   Past Medical History:  Diagnosis Date  . BPH (benign prostatic hyperplasia)    (-) Bx 2015  . Diabetes mellitus without complication ([place])   . Elevated PSA    Prostate Bx in 06/2013 was benign  . HTN (hypertension)   . Hyperlipidemia   . Macular degeneration, age related    BP (!) 142/88   Pulse 67   Ht 5\' 11"  (1.803 m) Comment: pt  reported, in wheelchair  Wt 172 lb (78 kg) Comment: pt reported, in wheelchair  SpO2 95%   BMI 23.99 kg/m   Opioid Risk Score:   Fall Risk Score:  `1  Depression screen PHQ 2/9  Depression screen [facility] 2/9 04/28/2018 08/25/2017 04/14/2016 12/14/2015 08/06/2015 07/31/2014  Decreased Interest 0 0 0 0 0 0  Down, Depressed, Hopeless 0 0 0 0 0 0  PHQ - 2 Score 0 0 0 0 0 0     Review of Systems  Constitutional: Positive for unexpected weight change.  HENT: Negative.   Eyes: Negative.   Respiratory: Negative.   Cardiovascular: Negative.   Gastrointestinal: Negative.   Endocrine: Negative.   Genitourinary: Negative.   Musculoskeletal: Positive for arthralgias and gait problem.  Skin: Negative.   Allergic/Immunologic: Negative.   Neurological: Positive for weakness and numbness.  Hematological: Negative.   Psychiatric/Behavioral: Negative.   All other systems reviewed and are negative.      Objective:   Physical Exam Constitutional: No distress . Vital signs reviewed. HENT: Normocephalic.  Atraumatic. Eyes: EOMI. No discharge. Cardiovascular:  RRR. No JVD. Respiratory: CTA  bilaterally. Normal effort. GI: BS +. Non-distended. Musc: No edema and tenderness in extremities. Neurological: He is alert and oriented Follows basic commands  Motor:  LUE: Shoulder abduction 4/5, elbow flex 4+/5, elbow extension 4/5, hand grip 4/5 LLE: HF, KE, ADF 4+/5 Left facial droop Skin: Skin is warm and dry.  Psychiatric: He has a normal mood and affect. His behavior is normal. Thought content normal.     Assessment & Plan:  72 year old right-handed male with history of diabetes mellitus, hypertension presents for transitional care management after receiving CIR for right thalamic hemorrhage.   1. 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 Pain Management:   Tylenol as needed  Will increased  Gabapentin to 200 3 times daily   D/c Tramadol  3. PAF with RVR.    Cont meds  Follow up with Cards  4. Diabetes mellitus.   Cont meds  5. Hypertension   Controlled today  Cont meds  6. Gait abnormality  Cont therapies  Cont walker/wheelchair for safety  Meds reviewed Referrals reviewed All questions answered

## 2018-04-29 DIAGNOSIS — I69322 Dysarthria following cerebral infarction: Secondary | ICD-10-CM | POA: Diagnosis not present

## 2018-04-29 DIAGNOSIS — I69354 Hemiplegia and hemiparesis following cerebral infarction affecting left non-dominant side: Secondary | ICD-10-CM | POA: Diagnosis not present

## 2018-04-29 DIAGNOSIS — E1142 Type 2 diabetes mellitus with diabetic polyneuropathy: Secondary | ICD-10-CM | POA: Diagnosis not present

## 2018-04-29 DIAGNOSIS — Z7984 Long term (current) use of oral hypoglycemic drugs: Secondary | ICD-10-CM | POA: Diagnosis not present

## 2018-04-29 DIAGNOSIS — E785 Hyperlipidemia, unspecified: Secondary | ICD-10-CM | POA: Diagnosis not present

## 2018-04-29 DIAGNOSIS — I69392 Facial weakness following cerebral infarction: Secondary | ICD-10-CM | POA: Diagnosis not present

## 2018-04-29 DIAGNOSIS — I480 Paroxysmal atrial fibrillation: Secondary | ICD-10-CM | POA: Diagnosis not present

## 2018-04-29 DIAGNOSIS — I10 Essential (primary) hypertension: Secondary | ICD-10-CM | POA: Diagnosis not present

## 2018-04-30 ENCOUNTER — Telehealth: Payer: Self-pay

## 2018-04-30 DIAGNOSIS — Z7984 Long term (current) use of oral hypoglycemic drugs: Secondary | ICD-10-CM | POA: Diagnosis not present

## 2018-04-30 DIAGNOSIS — I69354 Hemiplegia and hemiparesis following cerebral infarction affecting left non-dominant side: Secondary | ICD-10-CM | POA: Diagnosis not present

## 2018-04-30 DIAGNOSIS — I10 Essential (primary) hypertension: Secondary | ICD-10-CM | POA: Diagnosis not present

## 2018-04-30 DIAGNOSIS — I69322 Dysarthria following cerebral infarction: Secondary | ICD-10-CM | POA: Diagnosis not present

## 2018-04-30 DIAGNOSIS — E785 Hyperlipidemia, unspecified: Secondary | ICD-10-CM | POA: Diagnosis not present

## 2018-04-30 DIAGNOSIS — I480 Paroxysmal atrial fibrillation: Secondary | ICD-10-CM | POA: Diagnosis not present

## 2018-04-30 DIAGNOSIS — E1142 Type 2 diabetes mellitus with diabetic polyneuropathy: Secondary | ICD-10-CM | POA: Diagnosis not present

## 2018-04-30 DIAGNOSIS — I69392 Facial weakness following cerebral infarction: Secondary | ICD-10-CM | POA: Diagnosis not present

## 2018-04-30 NOTE — Telephone Encounter (Signed)
[facility] called requesting an extension of verbal orders for 2xwk X 2wks.  Called her back and approved verbal orders

## 2018-05-03 ENCOUNTER — Telehealth: Payer: Self-pay | Admitting: *Deleted

## 2018-05-03 ENCOUNTER — Telehealth: Payer: Self-pay | Admitting: Internal Medicine

## 2018-05-03 NOTE — Telephone Encounter (Signed)
(  At the last visit, BP was well controlled) BP needs to be monitored daily by the patient or the visiting nurse or physical therapist. Please call with report next week.

## 2018-05-03 NOTE — Telephone Encounter (Signed)
Wells Guiles RN [facility] called for renewal of ST 2wk2 and OT 2 wk3.  Approval given.

## 2018-05-03 NOTE — Telephone Encounter (Signed)
LMOM for [facility] w/ recommendations. Instructed to call if questions/concerns.

## 2018-05-03 NOTE — Telephone Encounter (Signed)
Please advise 

## 2018-05-03 NOTE — Telephone Encounter (Signed)
Copied from [place] [phone]. Topic: Quick Communication - See Telephone Encounter >> May 03, 2018  2:24 PM Vernona Rieger wrote: CRM for notification. See Telephone encounter for: 05/03/18.]  Beth, physical therapist assistance with [facility] called to say she went out to see him and his blood pressure is reading 170/100 with no symptom complaint, denies headache or blurred vision. Call back @ [phone]

## 2018-05-04 DIAGNOSIS — Z7984 Long term (current) use of oral hypoglycemic drugs: Secondary | ICD-10-CM | POA: Diagnosis not present

## 2018-05-04 DIAGNOSIS — I10 Essential (primary) hypertension: Secondary | ICD-10-CM | POA: Diagnosis not present

## 2018-05-04 DIAGNOSIS — E1142 Type 2 diabetes mellitus with diabetic polyneuropathy: Secondary | ICD-10-CM | POA: Diagnosis not present

## 2018-05-04 DIAGNOSIS — I69354 Hemiplegia and hemiparesis following cerebral infarction affecting left non-dominant side: Secondary | ICD-10-CM | POA: Diagnosis not present

## 2018-05-04 DIAGNOSIS — I69392 Facial weakness following cerebral infarction: Secondary | ICD-10-CM | POA: Diagnosis not present

## 2018-05-04 DIAGNOSIS — I69322 Dysarthria following cerebral infarction: Secondary | ICD-10-CM | POA: Diagnosis not present

## 2018-05-04 DIAGNOSIS — E785 Hyperlipidemia, unspecified: Secondary | ICD-10-CM | POA: Diagnosis not present

## 2018-05-04 DIAGNOSIS — I480 Paroxysmal atrial fibrillation: Secondary | ICD-10-CM | POA: Diagnosis not present

## 2018-05-05 DIAGNOSIS — E785 Hyperlipidemia, unspecified: Secondary | ICD-10-CM | POA: Diagnosis not present

## 2018-05-05 DIAGNOSIS — I69392 Facial weakness following cerebral infarction: Secondary | ICD-10-CM | POA: Diagnosis not present

## 2018-05-05 DIAGNOSIS — I69322 Dysarthria following cerebral infarction: Secondary | ICD-10-CM | POA: Diagnosis not present

## 2018-05-05 DIAGNOSIS — E1142 Type 2 diabetes mellitus with diabetic polyneuropathy: Secondary | ICD-10-CM | POA: Diagnosis not present

## 2018-05-05 DIAGNOSIS — Z7984 Long term (current) use of oral hypoglycemic drugs: Secondary | ICD-10-CM | POA: Diagnosis not present

## 2018-05-05 DIAGNOSIS — I10 Essential (primary) hypertension: Secondary | ICD-10-CM | POA: Diagnosis not present

## 2018-05-05 DIAGNOSIS — I69354 Hemiplegia and hemiparesis following cerebral infarction affecting left non-dominant side: Secondary | ICD-10-CM | POA: Diagnosis not present

## 2018-05-05 DIAGNOSIS — I480 Paroxysmal atrial fibrillation: Secondary | ICD-10-CM | POA: Diagnosis not present

## 2018-05-06 DIAGNOSIS — I10 Essential (primary) hypertension: Secondary | ICD-10-CM | POA: Diagnosis not present

## 2018-05-06 DIAGNOSIS — E1142 Type 2 diabetes mellitus with diabetic polyneuropathy: Secondary | ICD-10-CM | POA: Diagnosis not present

## 2018-05-06 DIAGNOSIS — I480 Paroxysmal atrial fibrillation: Secondary | ICD-10-CM | POA: Diagnosis not present

## 2018-05-06 DIAGNOSIS — E785 Hyperlipidemia, unspecified: Secondary | ICD-10-CM | POA: Diagnosis not present

## 2018-05-06 DIAGNOSIS — I69354 Hemiplegia and hemiparesis following cerebral infarction affecting left non-dominant side: Secondary | ICD-10-CM | POA: Diagnosis not present

## 2018-05-06 DIAGNOSIS — Z7984 Long term (current) use of oral hypoglycemic drugs: Secondary | ICD-10-CM | POA: Diagnosis not present

## 2018-05-06 DIAGNOSIS — I69322 Dysarthria following cerebral infarction: Secondary | ICD-10-CM | POA: Diagnosis not present

## 2018-05-06 DIAGNOSIS — I69392 Facial weakness following cerebral infarction: Secondary | ICD-10-CM | POA: Diagnosis not present

## 2018-05-07 ENCOUNTER — Telehealth: Payer: Self-pay | Admitting: Internal Medicine

## 2018-05-07 DIAGNOSIS — I69322 Dysarthria following cerebral infarction: Secondary | ICD-10-CM | POA: Diagnosis not present

## 2018-05-07 DIAGNOSIS — I69392 Facial weakness following cerebral infarction: Secondary | ICD-10-CM | POA: Diagnosis not present

## 2018-05-07 DIAGNOSIS — E1142 Type 2 diabetes mellitus with diabetic polyneuropathy: Secondary | ICD-10-CM | POA: Diagnosis not present

## 2018-05-07 DIAGNOSIS — E785 Hyperlipidemia, unspecified: Secondary | ICD-10-CM | POA: Diagnosis not present

## 2018-05-07 DIAGNOSIS — Z7984 Long term (current) use of oral hypoglycemic drugs: Secondary | ICD-10-CM | POA: Diagnosis not present

## 2018-05-07 DIAGNOSIS — I69354 Hemiplegia and hemiparesis following cerebral infarction affecting left non-dominant side: Secondary | ICD-10-CM | POA: Diagnosis not present

## 2018-05-07 DIAGNOSIS — I480 Paroxysmal atrial fibrillation: Secondary | ICD-10-CM | POA: Diagnosis not present

## 2018-05-07 DIAGNOSIS — I10 Essential (primary) hypertension: Secondary | ICD-10-CM | POA: Diagnosis not present

## 2018-05-07 NOTE — Telephone Encounter (Signed)
Copied from [place] [phone]. Topic: Quick Communication - Home Health Verbal Orders >> May 07, 2018 12:14 PM Rayann Heman wrote: Claiborne Billings calling from [facility] called and stated that she would like [place] to know that pt will discharged from all services. 4 OT and 2 speech therapy visits that will not be completed. Claiborne Billings states that they are requesting a referral to [facility] outpatient. Fax#[phone]  please advise Cb#[phone]

## 2018-05-07 NOTE — Telephone Encounter (Signed)
Please advise 

## 2018-05-07 NOTE — Telephone Encounter (Signed)
Patient has an appointment w/  neurology already, November 25,@ [facility]. If he needs more SP OT that is okay.

## 2018-05-14 NOTE — Progress Notes (Signed)
[facility] [address] [place]. [place] [address] [phone]       OFFICE FOLLOW UP NOTE  Marcus Beasley Date of Birth:  [birth year] Medical Record Number:  [id number]   Reason for Referral:  hospital stroke follow up  CHIEF COMPLAINT:  Chief Complaint  Patient presents with  . 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 History obtained from patient, wife and daughter and chart review. Reviewed all radiology images and labs personally.  Marcus Beasley is a 72 y.o. male with history of hyperlipidemia, hypertension, diabetes mellitus, and BPH  who presented with facial numbness, slurred speech, leg weakness, and facial droop.   CT head reviewed and showed 4 cc acute hematoma in the right thalamus and corona radiata.  He did not receive IV t-PA due to [place].  MRI brain reviewed and showed right thalamic ICH stable in size without abnormal enhancement.  MRA head negative for aneurysm or AVM.  Carotid Dopplers unremarkable.  2D echo showed an EF of 65 to 70%.  ICH likely secondary to hypertension.  He was found to be in proximal atrial fibrillation with RVR and was stabilized with use of Cardizem drip.  Anticoagulation was not started due to [place] but recommended to start in the future.  Recommended to resume prior dose of aspirin in 2 weeks time.  Blood pressure stabilized on Cleviprex and recommended long-term BP goal normotensive range.  LDL 94 and recommended increasing atorvastatin to 40 mg daily.  A1c 6.4 and recommended continue follow-up with PCP for DM management.  Patient was discharged to CIR for continued deficits in stable condition.  Patient was discharged home on 04/14/2018 with recommendations of home therapy.  Patient is being seen today for hospital  follow-up.  He did have appointment with cardiology on 04/26/2018 and was requested to follow-up in our office for recommendation on starting anticoagulation due to [place].  He has not had any repeat imaging since hospital discharge.  It does not appear as though patient was started on aspirin 81 mg at discharge and he states he was told not to take this medication by rehab providers prior to discharge.  Continues on atorvastatin without side effects myalgias.  Blood pressure today 157/102 with recent medication adjustments made by cardiologist with increasing Cardizem. He does monitor at home and typically 150s/100s. Daughter believes that since increase of cardizem, his blood pressure readings have been increased.  He continues to experience weakness of left side with greater improvement in his left leg. He is able to ambulate with RW but w/c for long distance. Left arm feels as though heavy, stiff, numbness/tingling and burning. He denies much improvement with left upper extremity weakness and symptoms.  He was started on gabapentin with recent increase to 200 mg 3 times daily without much benefit.  Denies any further speech deficit.  He has recently completed home health therapies and requesting order for continued outpatient therapies.  No further concerns at this time.  Denies new or worsening stroke/TIA symptoms.      ROS:   14 system review of systems performed and negative with exception of activity change, appetite change, unexpected weight change, daytime sleepiness, snoring, frequency of urination, joint pain, joint swelling and numbness  PMH:  Past  Medical History:  Diagnosis Date  . BPH (benign prostatic hyperplasia)    (-) Bx 2015  . Diabetes mellitus without complication ([place])   . Elevated PSA    Prostate Bx in 06/2013 was benign  . HTN (hypertension)   . Hyperlipidemia   . Macular degeneration, age related     PSH:  Past Surgical History:  Procedure Laterality Date  . ABCESS  DRAINAGE     abdomen- 26 day hospitalization 1968  . COLONOSCOPY  2016  . HERNIA REPAIR  summer '11   umbilical, dr Ninfa Linden   . POLYPECTOMY    . PROSTATE BIOPSY  06-2013 , 07-2017   (-), (-)    Social History:  Social History   Socioeconomic History  . Marital status: Married    Spouse name: Not on file  . Number of children: 3  . Years of education: 5  . Highest education level: Not on file  Occupational History  . 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 Financial resource strain: Not on file  . Food insecurity:    Worry: Not on file    Inability: Not on file  . Transportation needs:    Medical: Not on file    Non-medical: Not on file  Tobacco Use  . Smoking status: Never Smoker  . Smokeless tobacco: Never Used  Substance and Sexual Activity  . Alcohol use: Yes    Comment: occ. beer  . Drug use: No  . Sexual activity: Yes    Partners: Female  Lifestyle  . Physical activity:    Days per week: Not on file    Minutes per session: Not on file  . Stress: Not on file  Relationships  . Social connections:    Talks on phone: Not on file    Gets together: Not on file    Attends religious service: Not on file    Active member of club or organization: Not on file    Attends meetings of clubs or organizations: Not on file    Relationship status: Not on file  . Intimate partner violence:    Fear of current or ex partner: Not on file    Emotionally abused: Not on file    Physically abused: Not on file    Forced sexual activity: Not on file  Other Topics Concern  . 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 2 dtrs , 1 son - homicide. 5 grandchildren.      Family History:  Family History  Problem Relation Age of Onset  . Leukemia Mother   . Heart attack Father        MI age 50  . Heart disease Sister        age 7  . Cancer - Other Sister        type  . Heart disease  Brother        age 62  . Kidney disease Brother        HD  . Diabetes Maternal Grandmother   . Prostate cancer Neg Hx   . Colon cancer Neg Hx   . Esophageal cancer Neg Hx   . Rectal cancer Neg Hx   . Stomach cancer Neg Hx     Medications:   Current Outpatient Medications on File Prior to Visit  Medication Sig Dispense Refill  . atorvastatin (LIPITOR) 20 MG tablet Take 1 tablet (20 mg  total) by mouth daily. 90 tablet 1  . diltiazem (CARDIZEM CD) 240 MG 24 hr capsule Take 1 capsule (240 mg total) by mouth daily. 90 capsule 1  . metFORMIN (GLUCOPHAGE) 850 MG tablet Take 1 tablet (850 mg total) by mouth 2 (two) times daily with a meal. 180 tablet 1  . metoprolol tartrate (LOPRESSOR) 50 MG tablet Take 1 tablet (50 mg total) by mouth 2 (two) times daily. 30 tablet 1  . Multiple Vitamin (MULTIVITAMIN WITH MINERALS) TABS tablet Take 1 tablet by mouth daily. Centrum    . Multiple Vitamins-Minerals (PRESERVISION/LUTEIN PO) Take by mouth.     No current facility-administered medications on file prior to visit.     Allergies:  No Known Allergies   Physical Exam  Vitals:   05/17/18 0906  BP: (!) 157/102  Pulse: 82  Weight: 169 lb 6.4 oz (76.8 kg)  Height: 5\' 11"  (1.803 m)   Body mass index is 23.63 kg/m. No exam data present  General: well developed, well nourished, pleasant elderly African-American male, seated, in no evident distress Head: head normocephalic and atraumatic.   Neck: supple with no carotid or supraclavicular bruits Cardiovascular: regular rate and rhythm, no murmurs Musculoskeletal: no deformity Skin:  no rash/petichiae Vascular:  Normal pulses all extremities  Neurologic Exam Mental Status: Awake and fully alert. Oriented to place and time. Recent and remote memory intact. Attention span, concentration and fund of knowledge appropriate. Mood and affect appropriate.  Cranial Nerves: Fundoscopic exam reveals sharp disc margins. Pupils equal, briskly reactive to  light. Extraocular movements full without nystagmus. Visual fields full to confrontation. Hearing intact. Facial sensation intact.  Left lower facial paralysis. Motor: Normal bulk and tone. LUE: 3+/5 with weak grip strength; LLE: 4+/5 greater in hip flexor; full strength right upper and lower extremity Sensory.:  Decreased sensation left face compared to right side along with decreased left hand digit sensation Coordination: Rapid alternating movements normal on right side. Finger-to-nose and heel-to-shin performed accurately on right side.  Decreased left finger dexterity.  Orbits right arm over left arm. Gait and Station: Patient currently sitting in wheelchair and does ambulate with rolling walker but rolling walker not present at today's appointment, gait assessment deferred Reflexes: 1+ and symmetric. Toes downgoing.    NIHSS  3 Modified Rankin  3 CHA2DS2-VASc 5 HAS-BLED 3   Diagnostic Data (Labs, Imaging, Testing)  CT HEAD WO CONTRAST 03/12/2018 IMPRESSION: 4 cc acute hematoma in the right thalamus and corona radiata, often hypertensive in this location.  MR BRAIN WO CONTRAST MR MRA HEAD  03/13/2018 IMPRESSION: Approximate 5 mL RIGHT thalamic hemorrhage, mild surrounding edema. No abnormal postcontrast enhancement or diffusion abnormality outside of the area of the bleed. Mild surrounding edema without midline shift. 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  ECHOCARDIOGRAM 03/13/2018 Study Conclusions - Left ventricle: The cavity size was normal. Wall thickness was   increased in a pattern of mild LVH. Systolic function was   vigorous. The estimated ejection fraction was in the range of 65%   to 70%. Wall motion was normal; there were no regional wall   motion  abnormalities. - Aortic valve: There was trivial regurgitation.    ASSESSMENT: Marcus Beasley is a 72 y.o. 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 Vascular risk  factors include HTN, HLD and DM.  Patient is being seen today for hospital follow-up and does continue to have left hemiparesis arm > leg but otherwise has been stable without new or worsening deficits.    PLAN:  1. Right thalamic and corona radiate ICH: Start aspirin 81 mg daily  and atorvastatin for secondary stroke prevention.  We will repeat CT head to assess for resolution of prior ICH.  If resolving ICH, will recommend starting anticoagulation for atrial fibrillation and secondary stroke prevention.  Maintain strict control of hypertension with blood pressure goal below 130/90, diabetes with hemoglobin A1c goal below 6.5% and cholesterol with LDL cholesterol (bad cholesterol) goal below 70 mg/dL.  I also advised the patient to eat a healthy diet with plenty of whole grains, cereals, fruits and vegetables, exercise regularly with at least 30 minutes of continuous activity daily and maintain ideal body weight. 2. Left hemiparesis: Order placed for outpatient PT/OT for continued left hemiparesis.  Advised patient to continue to stay active at home with doing home exercise provided by home health therapies 3. thalamic pain: Increase gabapentin from 200 mg 3 times daily to 300 mg 3 times daily.  Advised patient to call office after 1 to 2 weeks if he does not experience benefit from increase gabapentin and we can consider alternate therapy at that time.  Patient and family were also advised that as this pain is related to his [place] affecting his thalamic area, at times it is difficult to treat thalamic pain symptoms but we can continue to trial medications for symptom relief.  Also advised patient that hopefully his pain will continue to improve over time but it is difficult to  determine extent of improvement.  Patient and family verbalized understanding. 4. PAF: Continue to follow with cardiologist and possibly starting anticoagulation with Eliquis or Xarelto if CT head shows resolution of ICH 5. HTN: Start amlodipine 2.5 mg daily due to continued blood pressure elevations.  Advised to continue current dosages of diltiazem and metoprolol.  Today's BP 157/102.  Advised to continue to monitor blood pressure readings at home along with scheduling sooner follow-up appointment with cardiologist for continued HTN and atrial fibrillation management 6. HLD: Advised to continue current treatment regimen along with continued follow-up with PCP for future prescribing and monitoring of lipid panel 7. DMII: Advised to continue to monitor glucose levels at home along with continued follow-up with PCP for management and monitoring    Follow up in 2 months or call earlier if needed   Greater than 50% of time during this 25 minute visit was spent on counseling, explanation of diagnosis of right thalamic and corona radiata ICH, reviewing risk factor management of PAF, HTN, HLD and DM, planning of further management along with potential future management, and discussion with patient and family answering all questions.    Venancio Poisson, AGNP-BC  [facility] [address] [place], [place] [address]  Phone [phone] Fax [phone] Note: This document was prepared with digital dictation and possible smart phrase technology. Any transcriptional errors that result from this process are unintentional.

## 2018-05-15 DIAGNOSIS — I69354 Hemiplegia and hemiparesis following cerebral infarction affecting left non-dominant side: Secondary | ICD-10-CM | POA: Diagnosis not present

## 2018-05-17 ENCOUNTER — Encounter: Payer: Self-pay | Admitting: Adult Health

## 2018-05-17 ENCOUNTER — Ambulatory Visit: Payer: BLUE CROSS/BLUE SHIELD | Admitting: Adult Health

## 2018-05-17 VITALS — BP 157/102 | HR 82 | Ht 71.0 in | Wt 169.4 lb

## 2018-05-17 DIAGNOSIS — I610 Nontraumatic intracerebral hemorrhage in hemisphere, subcortical: Secondary | ICD-10-CM

## 2018-05-17 DIAGNOSIS — M792 Neuralgia and neuritis, unspecified: Secondary | ICD-10-CM

## 2018-05-17 DIAGNOSIS — I69354 Hemiplegia and hemiparesis following cerebral infarction affecting left non-dominant side: Secondary | ICD-10-CM

## 2018-05-17 DIAGNOSIS — I10 Essential (primary) hypertension: Secondary | ICD-10-CM | POA: Diagnosis not present

## 2018-05-17 DIAGNOSIS — E785 Hyperlipidemia, unspecified: Secondary | ICD-10-CM

## 2018-05-17 MED ORDER — GABAPENTIN 300 MG PO CAPS: 300.0000 mg | ORAL_CAPSULE | Freq: Three times a day (TID) | ORAL | 1 refills | Status: DC

## 2018-05-17 MED ORDER — ASPIRIN 81 MG PO TABS: 81.0000 mg | ORAL_TABLET | Freq: Every day | ORAL | 0 refills | Status: DC

## 2018-05-17 MED ORDER — AMLODIPINE BESYLATE 2.5 MG PO TABS: 2.5000 mg | ORAL_TABLET | Freq: Every day | ORAL | 2 refills | Status: DC

## 2018-05-17 NOTE — Progress Notes (Signed)
I agree with the above plan 

## 2018-05-17 NOTE — Patient Instructions (Addendum)
Restart aspirin 81 mg daily  and lipitor  for secondary stroke prevention  You will be called to schedule imaging of your brain to assess for resolution of bleed. At that time, we will consider starting you on a stronger blood thinner to help with your atrial fibrillation  Start amlodipine 2.5 mg daily for continued elevated blood pressure  Increase gabapentin dose from 200mg  three times daily to 300mg  three times daily. After 1-2 weeks if you are not feeling any improvement, please let me know and we can consider changing medications at that time  Continue to follow up with PCP regarding cholesterol, diabetes and blood pressure management   Schedule sooner appointment with cardiologist for atrial fibrillation and blood pressure management  You should be called by outpatient neuro rehab to schedule therapies physical and occupation. If you do not hear from them by mid next week, please call [phone]  Continue to monitor blood pressure at home  Maintain strict control of hypertension with blood pressure goal below 130/90, diabetes with hemoglobin A1c goal below 6.5% and cholesterol with LDL cholesterol (bad cholesterol) goal below 70 mg/dL. I also advised the patient to eat a healthy diet with plenty of whole grains, cereals, fruits and vegetables, exercise regularly and maintain ideal body weight.  Followup in the future with me in 2 months or call earlier if needed       Thank you for coming to see [place] at [facility]. I hope we have been able to provide you high quality care today.  You may receive a patient satisfaction survey over the next few weeks. We would appreciate your feedback and comments so that we may continue to improve ourselves and the health of our patients.

## 2018-05-25 ENCOUNTER — Other Ambulatory Visit: Payer: Self-pay | Admitting: Physician Assistant

## 2018-05-26 ENCOUNTER — Ambulatory Visit
Admission: RE | Admit: 2018-05-26 | Discharge: 2018-05-26 | Disposition: A | Discharge status: Home / Self Care | Payer: Medicare Other | Source: Ambulatory Visit | Attending: Adult Health | Admitting: Adult Health

## 2018-05-26 DIAGNOSIS — I610 Nontraumatic intracerebral hemorrhage in hemisphere, subcortical: Secondary | ICD-10-CM

## 2018-06-02 ENCOUNTER — Encounter: Payer: Self-pay | Admitting: Physical Therapy

## 2018-06-02 ENCOUNTER — Ambulatory Visit: Payer: Medicare Other | Attending: Internal Medicine | Admitting: Occupational Therapy

## 2018-06-02 ENCOUNTER — Telehealth: Payer: Self-pay

## 2018-06-02 ENCOUNTER — Ambulatory Visit: Payer: Medicare Other | Admitting: Physical Therapy

## 2018-06-02 ENCOUNTER — Other Ambulatory Visit: Payer: Self-pay

## 2018-06-02 DIAGNOSIS — R2681 Unsteadiness on feet: Secondary | ICD-10-CM | POA: Insufficient documentation

## 2018-06-02 DIAGNOSIS — R2689 Other abnormalities of gait and mobility: Secondary | ICD-10-CM | POA: Diagnosis not present

## 2018-06-02 DIAGNOSIS — R278 Other lack of coordination: Secondary | ICD-10-CM | POA: Diagnosis not present

## 2018-06-02 DIAGNOSIS — I69254 Hemiplegia and hemiparesis following other nontraumatic intracranial hemorrhage affecting left non-dominant side: Secondary | ICD-10-CM | POA: Diagnosis not present

## 2018-06-02 DIAGNOSIS — M6281 Muscle weakness (generalized): Secondary | ICD-10-CM | POA: Diagnosis not present

## 2018-06-02 DIAGNOSIS — R208 Other disturbances of skin sensation: Secondary | ICD-10-CM | POA: Diagnosis not present

## 2018-06-02 DIAGNOSIS — I69118 Other symptoms and signs involving cognitive functions following nontraumatic intracerebral hemorrhage: Secondary | ICD-10-CM

## 2018-06-02 NOTE — Telephone Encounter (Signed)
Rn call patients wife on dpr. Rn stated the Ct shows the hemorrhage in the brain is improving. The wife verbalized understanding. Rn remind pts wife of the appt in January with Janett Billow NP with time and date to check in. ------

## 2018-06-02 NOTE — Therapy (Signed)
[facility] [address] [place], [place], [address] Phone: [phone]   Fax:  [phone]  Occupational Therapy Evaluation  Patient Details  Name: Marcus Beasley MRN: [id number] Date of Birth: 06-24-1945 Referring Provider (OT): Venancio Poisson   Encounter Date: 06/02/2018  OT End of Session - 06/02/18 1134    Visit Number  1    Number of Visits  17    Date for OT Re-Evaluation  08/03/18    Authorization Type  [employer] MCR    OT Start Time  4656    OT Stop Time  1100    OT Time Calculation (min)  45 min    Activity Tolerance  Patient tolerated treatment well    Behavior During Therapy  [facility] for tasks assessed/performed       Past Medical History:  Diagnosis Date  . BPH (benign prostatic hyperplasia)    (-) Bx 2015  . Diabetes mellitus without complication ([place])   . Elevated PSA    Prostate Bx in 06/2013 was benign  . HTN (hypertension)   . Hyperlipidemia   . Macular degeneration, age related     Past Surgical History:  Procedure Laterality Date  . ABCESS DRAINAGE     abdomen- 26 day hospitalization 1968  . COLONOSCOPY  2016  . HERNIA REPAIR  summer '11   umbilical, dr Ninfa Linden   . POLYPECTOMY    . PROSTATE BIOPSY  06-2013 , 07-2017   (-), (-)    There were no vitals filed for this visit.  Subjective Assessment - 06/02/18 1005    Subjective   My LT arm feels heavy and tingling    Patient is accompained by:  Family member   wife and friend   Pertinent History  Rt thalamic hemorrhage 03/12/18. PMH: HTN, HLD, DM, A-fib    Limitations  Fall risk    Patient Stated Goals  get more function in my Lt arm and hand    Currently in Pain?  No/denies        [facility] OT Assessment - 06/02/18 0001      Assessment   Medical Diagnosis  Rt thalamic hemorrhage    Referring Provider (OT)  Venancio Poisson    Onset Date/Surgical Date  03/12/18    Hand Dominance  Right    Next MD Visit  06/09/18 with Dr. Posey Pronto     Prior Therapy  Inpatient rehab 9/25 - 04/15/18, HHOT/PT      Precautions   Precautions  Fall      Balance Screen   Has the patient fallen in the past 6 months  No      Home  Environment   Bathroom Shower/Tub  Door;Walk-in Shower    Additional Comments  Pt lives w/ wife in 3 story home, bedroom/bathroom on 2nd level but has a bed and 1/2 bath on main level right now. Pt has 5 steps to enter home w/ handrails on both sides. Pt currently sponge bathing      Prior Function   Level of Independence  Independent    Vocation  Retired    Leisure  walk, plays basketball (prior to stroke)       ADL   Eating/Feeding  Needs assist with cutting food    Grooming  Independent    Upper Body Bathing  Minimal assistance   for back (sponge bathing d/t full bath upstairs)    Lower Body Bathing  Minimal assistance   wife does feet and sup  for standing   Upper Body Dressing  Minimal assistance   for long sleeves, has not attempted buttons   Lower Body Dressing  Minimal assistance   dependent for socks, elastic shoe laces   Toilet Transfer  Modified independent    Toileting - Clothing Manipulation  --   supervision   Toileting -  Hygiene  Independent    ADL comments  sponge bathing currently d/t no full bath on main level. Pt has walk in shower upstairs      IADL   Shopping  Completely unable to shop    Light Housekeeping  --   folding clothes seated   Meal Prep  Needs to have meals prepared and served   Pt cooked prior to stroke   Medication Management  Takes responsibility if medication is prepared in advance in seperate dosage    Financial Management  Dependent      Mobility   Mobility Status Comments  uses w/c primarily, uses walker in house some w/ supervision      Written Expression   Dominant Hand  Right    Handwriting  --   denies change     Vision - History   Baseline Vision  Wears glasses only for reading    Visual History  Macular degeneration   pre-cataracts     Vision  Assessment   Ocular Range of Motion  Within Functional Limits    Convergence  Within functional limits    Visual Fields  No apparent deficits    Comment  denies changes. Pt has slightly lower eyelid Rt side but suspect not from stroke      Cognition   Cognition Comments  denies changes      Observation/Other Assessments   Observations  pt arrives in w/c w/ Lt hemiparesis. Lt scapula hypermobile    Outcome Measures  Pt had max difficulty and mod errors subtracting by 3"s, unable by 7's. Delayed recall 3/3      Sensation   Light Touch  Impaired Detail    Light Touch Impaired Details  Absent LUE   at hand, intact proximally   Proprioception  --   abscent in hand   Additional Comments  unable to tell when hand fell off table, or if palm was up or down      Coordination   Gross Motor Movements are Fluid and Coordinated  No    Fine Motor Movements are Fluid and Coordinated  No    9 Hole Peg Test  Right;Left    Right 9 Hole Peg Test  23.47 sec.     Left 9 Hole Peg Test  placed 5 pegs in 2 minutes    Coordination  Pt has gross grasp to pick up pen, but no in hand manipulation      Edema   Edema  mild Lt hand      ROM / Strength   AROM / PROM / Strength  AROM;Strength      AROM   Overall AROM Comments  RUE AROM WNL's,  LUE WFL's however w/ decreased proximal stability and decr distal control. Lt hand full composite flexion only 75%. Noted hypermobility Lt scapula. Pt also w/ decreased ROM during functional reaching going into shoulder abduction and IR      Strength   Overall Strength Comments  RUE MMT 5/5 grossly, LUE MMT 3+/5      Hand Function   Right Hand Grip (lbs)  103 lbs    Left Hand Grip (lbs)  20 lbs                      OT Education - 06/02/18 1148    Education Details  OT Finding/POC, discussion re: lack of sensation LUE and safety considerations    Person(s) Educated  Patient;Spouse;Other (comment)   friend   Methods  Explanation    Comprehension   Verbalized understanding       OT Short Term Goals - 06/02/18 1140      OT SHORT TERM GOAL #1   Title  Pt independent with LUE HEP for neuro re-education - 07/03/18    Time  4    Period  Weeks    Status  New      OT SHORT TERM GOAL #2   Title  Pt independent with coordination and putty HEP Lt hand    Time  4    Period  Weeks    Status  New      OT SHORT TERM GOAL #3   Title  Pt to verbalize understanding with safety considerations LUE d/t lack of sensation    Time  4    Period  Weeks    Status  New      OT SHORT TERM GOAL #4   Title  Pt to cut food mod I level with A/E prn    Time  4    Period  Weeks    Status  New      OT SHORT TERM GOAL #5   Title  Pt to consistently don/doff shirt I'ly and don/doff socks I'ly w/ task modifications prn    Time  4    Period  Weeks    Status  New      Additional Short Term Goals   Additional Short Term Goals  Yes      OT SHORT TERM GOAL #6   Title  Pt to verbalize understanding and consistently demo correct reaching pattern LUE to prevent pain Lt shoulder    Time  4    Period  Weeks    Status  New      OT SHORT TERM GOAL #7   Title  Pt to improve coordination Lt hand as evidenced by performing 9 hole peg test in under 2 min.     Baseline  eval - placed 5 pegs in 2 min.    Time  4    Period  Weeks    Status  New        OT Long Term Goals - 06/02/18 1144      OT LONG TERM GOAL #1   Title  Pt to perform functional mid to high level reaching LUE w/ only min compensations and no pain - 08/03/18    Time  8    Period  Weeks    Status  New      OT LONG TERM GOAL #2   Title  Pt to improve Lt hand coordination as evidenced by performing 9 hole peg test in 90 sec. or under    Time  8    Period  Weeks    Status  New      OT LONG TERM GOAL #3   Title  Pt to improve grip strength Lt hand to 40 lbs or greater to assist w/ opening jars/containers    Baseline  eval: 20 lbs    Time  8    Period  Weeks    Status  New  OT  LONG TERM GOAL #4   Title  Pt to demo full composite flexion Lt hand for coordination/grasping tasks    Baseline  eval: 75%    Time  8    Period  Weeks    Status  New      OT LONG TERM GOAL #5   Title  Pt to be able to tie shoe laces and hook/unhook buttons using Lt hand as assist    Time  8    Period  Weeks    Status  New      Long Term Additional Goals   Additional Long Term Goals  Yes      OT LONG TERM GOAL #6   Title  Pt to return to simple cooking and cleaning tasks from standing level w/o LOB using countertop support prn    Time  8    Period  Weeks    Status  New      OT LONG TERM GOAL #7   Title  Pt to return to financial management tasks w/ supervision prn    Time  8    Period  Weeks    Status  New            Plan - 06/02/18 1135    Clinical Impression Statement  Pt is a 73 y.o. male who presents to outpatient O.T. s/p Rt thalamic hemorrhage on 03/12/18 secondary to HTN. Pt w/ Lt non dominant side hemiparesis, decreased coordination, decreased strength and proximal stability of LUE, abscent sensation Lt hand, and mild cognitive deficits.     Occupational Profile and client history currently impacting functional performance  PMH; HTN, HLD, DM, A-fib. Current deficits impeding pt's ability to perform ADLS, IADLS, and role as husband, friend, and leisure participation    Occupational performance deficits (Please refer to evaluation for details):  ADL's;IADL's;Leisure    Rehab Potential  Good    OT Frequency  2x / week    OT Duration  8 weeks   plus eval   OT Treatment/Interventions  Self-care/ADL training;Moist Heat;DME and/or AE instruction;Splinting;Therapeutic activities;Psychosocial skills training;Aquatic Therapy;Therapeutic exercise;Cognitive remediation/compensation;Coping strategies training;Neuromuscular education;Functional Mobility Training;Passive range of motion;Visual/perceptual remediation/compensation;Manual Therapy;Patient/family education;Electrical  Stimulation    Plan  issue coordination and putty HEP for Lt hand, also discuss and issue handout on further safety considerations LUE (Following session: NMR LUE including closed chain/wt bearing and functional low to mid level reaching)    Clinical Decision Making  Several treatment options, min-mod task modification necessary    Consulted and Agree with Plan of Care  Patient;Family member/caregiver    Family Member Consulted  wife and friend       Patient will benefit from skilled therapeutic intervention in order to improve the following deficits and impairments:  Decreased coordination, Decreased range of motion, Difficulty walking, Improper body mechanics, Decreased safety awareness, Impaired sensation, Impaired tone, Decreased activity tolerance, Decreased knowledge of precautions, Impaired UE functional use, Pain, Decreased knowledge of use of DME, Decreased balance, Decreased cognition, Decreased mobility, Decreased strength  Visit Diagnosis: 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 Neuropathic pain   . Labile blood pressure   . PAF (paroxysmal atrial fibrillation) ([place])   . Hemiparesis affecting left side as late effect of stroke ([place])   . Thalamic hemorrhage ([place]) 03/17/2018  . Benign essential HTN   . Dyslipidemia   . Hemorrhagic stroke ([place])   . ICH (intracerebral hemorrhage) ([place])  03/12/2018  . Prostate cancer ([place]) 01/29/2017  . 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 Diabetes ([place]) 12/14/2015  . PCP NOTES >>>>>>>>>>>>>>>>>>>>>>>>>>>>>>. 08/06/2015  . Dizziness and giddiness 01/29/2015  . Umbilical hernia 89/79/1504  . Elevated PSA, less than 10 ng/ml 04/19/2012  . Annual physical exam 01/24/2011  . Hyperlipidemia 01/03/2010  . Essential hypertension 09/20/2007    Carey Bullocks, OTR/L 06/02/2018, 11:52 AM  [facility] [address] [place], [place], [address] Phone: [phone]   Fax:  [phone]  Name: Marcus Beasley MRN: [id number] Date of Birth: Jan 16, 1946

## 2018-06-02 NOTE — Telephone Encounter (Signed)
Left vm for patients daughter Diedre on dpr to call back about Ct head results. RN tried to call pts home phone but no vm was available to leave message.

## 2018-06-04 ENCOUNTER — Other Ambulatory Visit: Payer: Self-pay

## 2018-06-04 ENCOUNTER — Encounter: Payer: Self-pay | Admitting: Physical Therapy

## 2018-06-04 NOTE — Therapy (Signed)
[name] [address] [place], [place], [address] Phone: [phone]   Fax:  [phone]  Physical Therapy Evaluation  Patient Details  Name: Marcus Beasley MRN: [id number] Date of Birth: 73-Sep-1947 No data recorded  Encounter Date: 06/02/2018  PT End of Session - 06/04/18 1600    Visit Number  1    Number of Visits  17    Date for PT Re-Evaluation  08/31/18    Authorization Type  [employer] Medicare; will need 10th visit progress notes    PT Start Time  1104    PT Stop Time  1147    PT Time Calculation (min)  43 min    Equipment Utilized During Treatment  Gait belt    Behavior During Therapy  WFL for tasks assessed/performed       Past Medical History:  Diagnosis Date  . BPH (benign prostatic hyperplasia)    (-) Bx 2015  . Diabetes mellitus without complication ([place])   . Elevated PSA    Prostate Bx in 06/2013 was benign  . HTN (hypertension)   . Hyperlipidemia   . Macular degeneration, age related     Past Surgical History:  Procedure Laterality Date  . ABCESS DRAINAGE     abdomen- 26 day hospitalization 1968  . COLONOSCOPY  2016  . HERNIA REPAIR  summer '11   umbilical, dr Ninfa Linden   . POLYPECTOMY    . PROSTATE BIOPSY  06-2013 , 07-2017   (-), (-)    There were no vitals filed for this visit.   Subjective Assessment - 06/04/18 1552    Subjective  Pt reports getting up one morning to walk dog, with L leg and arm numb, CVA on 03/12/18.  Was discharged home 04/15/18 and had home health PT for about 3 weeks.  I can move a little more and stand with the walker; L leg still buckles sometimes.  Uses RW at home with supervision.  No falls since CVA.    Patient is accompained by:  Family member   wife, Marcus Beasley and friend   Limitations  Walking;Standing    Patient Stated Goals  Pt wants to be able to walk again.  Wants to be able to use L hand and arm again; wants to drive, do yardwork.    Currently in Pain?   No/denies         [facility] PT Assessment - 06/04/18 1552      Assessment   Medical Diagnosis  Rt thalamic hemorrhage    Onset Date/Surgical Date  03/12/18    Hand Dominance  Right    Next MD Visit  06/09/18 with Dr. Posey Pronto    Prior Therapy  Inpatient rehab 9/25 - 04/15/18, HHOT/PT      Precautions   Precautions  Fall      Balance Screen   Has the patient fallen in the past 6 months  No    Has the patient had a decrease in activity level because of a fear of falling?   No    Is the patient reluctant to leave their home because of a fear of falling?   No      Home Social worker  Private residence    Living Arrangements  Spouse/significant other    Available Help at Discharge  Family    Type of [place] to enter    Entrance Stairs-Number of Steps  5  Entrance Stairs-Rails  Right;Left;Cannot reach both    Home Layout  Two level   Brought bed downstairs for now   Management consultant - [employer] - 2 wheels      Prior Function   Level of Independence  Independent    Vocation  Retired    Leisure  Enjoys walking, Haematologist, cooking      Observation/Other Assessments   Focus on Therapeutic Outcomes (FOTO)   NA      Sensation   Light Touch  Impaired by gross assessment    Light Touch Impaired Details  Impaired RLE    Proprioception  Appears Intact   for RLE     ROM / Strength   AROM / PROM / Strength  Strength      AROM   Overall AROM   Within functional limits for tasks performed      Strength   Overall Strength  Deficits    Overall Strength Comments  With seated MMT, pt noted to have posterior trunk lean, indicating trunk weakness     Strength Assessment Site  Hip;Knee;Ankle    Right/Left Hip  Right;Left    Right Hip Flexion  5/5    Left Hip Flexion  3+/5    Right/Left Knee  Right;Left    Right Knee Flexion  5/5    Right Knee Extension  5/5    Left Knee Flexion  4/5    Left Knee Extension  4/5    Right/Left  Ankle  Right;Left    Right Ankle Dorsiflexion  5/5    Left Ankle Dorsiflexion  3+/5      Transfers   Transfers  Sit to Stand;Stand to Sit    Sit to Stand  4: Min guard;With upper extremity assist;From chair/3-in-1    Stand to Sit  4: Min guard;With upper extremity assist;To chair/3-in-1      Ambulation/Gait   Ambulation/Gait  Yes    Ambulation/Gait Assistance  4: Min assist    Ambulation/Gait Assistance Details       Ambulation Distance (Feet)  70 Feet    Assistive device  Rolling walker   Hand trough for LUE   Gait Pattern  Step-through pattern;Decreased step length - left;Decreased stance time - left;Decreased dorsiflexion - left   Knee instability LLE, with L knee giving way x 1   Ambulation Surface  Level;Indoor    Gait velocity  31.63 sec = 1.04 ft/sec    Gait Comments  Pt reports having brace at home, but did not bring today; by his report, it seems he has a Programmer, multimedia Assessment   Standardized Balance Assessment  Timed Up and Go Test;Berg Balance Test      Berg Balance Test   Sit to Stand  Needs minimal aid to stand or to stabilize    Standing Unsupported  Able to stand 2 minutes with supervision    Sitting with Back Unsupported but Feet Supported on Floor or Stool  Able to sit safely and securely 2 minutes    Stand to Sit  Controls descent by using hands    Transfers  Able to transfer with verbal cueing and /or supervision    Standing Unsupported with Eyes Closed  Able to stand 10 seconds with supervision    Standing Ubsupported with Feet Together  Needs help to attain position but able to stand for 30 seconds with feet together    From Standing, Reach Forward with Outstretched  Arm  Reaches forward but needs supervision    From Standing Position, Pick up Object from Floor  Unable to try/needs assist to keep balance    From Standing Position, Turn to Look Behind Over each Shoulder  Needs supervision when turning    Turn 360 Degrees  Needs  assistance while turning    Standing Unsupported, Alternately Place Feet on Step/Stool  Needs assistance to keep from falling or unable to try    Standing Unsupported, One Foot in [place] help to step but can hold 15 seconds    Standing on One Leg  Unable to try or needs assist to prevent fall    Total Score  20    Berg comment:  Scores <45/56 indicate increased fall risk      Timed Up and Go Test   Normal TUG (seconds)  33.25    TUG Comments  Scores >13.5 seconds indicate increased fall risk; >30 seconds indicates difficulty with ADLs in the home.                Objective measurements completed on examination: See above findings.                PT Short Term Goals - 06/04/18 1607      PT SHORT TERM GOAL #1   Title  Pt will be independent with HEP for improved strength, balance, gait.  TARGET 07/02/18 (may be modified due to scheduling around holidays)    Time  4    Period  Weeks    Status  New    Target Date  07/02/18      PT SHORT TERM GOAL #2   Title  Pt will perform at least 8 of 10 reps of sit<>stand transfers independently, minimal to no UE support, for improved lower extremity strengthening and transfer efficiency.    Time  4    Period  Weeks    Status  New    Target Date  07/02/18      PT SHORT TERM GOAL #3   Title  Pt will improve Berg Balance score to at least 27/56 for decreased fall risk.    Time  4    Period  Weeks    Status  New    Target Date  07/02/18      PT SHORT TERM GOAL #4   Title  Pt will improve TUG score to less than or equal to 25 seconds for decreased fall risk.    Time  4    Period  Weeks    Status  New    Target Date  07/02/18      PT SHORT TERM GOAL #5   Title  Pt will verbalize understanding of fall prevention in home environment.    Time  4    Period  Weeks    Status  New    Target Date  07/02/18        PT Long Term Goals - 06/04/18 1610      PT LONG TERM GOAL #1   Title  Pt will verbalize plans for  continued community fitness upon d/c from PT.  TARGET 07/30/18    Time  8    Period  Weeks    Status  New    Target Date  07/30/18      PT LONG TERM GOAL #2   Title  Pt will improve gait velocity to at least 1.8 ft/sec for improved gait efficiency and safety.  Time  8    Period  Weeks    Status  New    Target Date  07/30/18      PT LONG TERM GOAL #3   Title  Pt will improve Berg score to at least 37/56 for decreased fall risk.    Time  8    Period  Weeks    Status  New    Target Date  07/30/18      PT LONG TERM GOAL #4   Title  Pt will improve TUG score to less than or equal to 15 seconds for decreased fall risk.    Time  8    Period  Weeks    Status  New    Target Date  07/30/18      PT LONG TERM GOAL #5   Title  Pt will ambulate at least 150 ft using least restrictive assistive device, modified independently, for improved independence with gait.    Time  8    Period  Weeks    Status  New    Target Date  07/30/18      Additional Long Term Goals   Additional Long Term Goals  Yes      PT LONG TERM GOAL #6   Title  Pt will negotiate at least 12 steps, one handrail, step-through pattern, modified independently, for improved stair negotiation in home.    Time  8    Period  Weeks    Status  New    Target Date  07/30/18             Plan - 06/04/18 1602    Clinical Impression Statement  Pt is a 72 year old male who presents to OP PT with history of CVA with L sided weakness, which occurred on 03/12/18.  He received inpatient therapies, then HHPT.  Prior to CVA, pt was independent with mobility, enjoyed cooking and yardwork.  He presents to OP PT with decreased strength, decreased balance, decreased independence with gait, decreased sensation, decreased independence with transfers.  He is at fall risk per TUG, gait velocity, and Berg scores.  He would benefit from skilled PT to address the above stated deficits to decrease fall risk and improve functional mobility and  independence.    History and Personal Factors relevant to plan of care:  PMH >3 co-morbidities, Independent prior to CVA, >3 systems involved following CVA    Clinical Presentation  Evolving    Clinical Presentation due to:  fall risk per TUG, Berg, and gait velocity scores    Clinical Decision Making  Moderate    Rehab Potential  Good    Clinical Impairments Affecting Rehab Potential  good family support; independnet prior to CVA    PT Frequency  2x / week    PT Duration  8 weeks   plus eval   PT Treatment/Interventions  ADLs/Self Care Home Management;Electrical Stimulation;Therapeutic exercise;Therapeutic activities;Functional mobility training;Gait training;Stair training;DME Instruction;Balance training;Neuromuscular re-education;Patient/family education;Orthotic Fit/Training;Manual techniques    PT Next Visit Plan  Initiate HEP for lower extremity strength, balance, gait training (pt to bring in knee cage)    Consulted and Agree with Plan of Care  Patient;Family member/caregiver    Family Member Consulted  wife and friend       Patient will benefit from skilled therapeutic intervention in order to improve the following deficits and impairments:  Abnormal gait, Decreased balance, Decreased mobility, Difficulty walking, Decreased strength, Impaired tone, Postural dysfunction  Visit Diagnosis: Other abnormalities of  gait and mobility  Unsteadiness on feet  Muscle weakness (generalized)     Problem List Patient Active Problem List   Diagnosis Date Noted  . Neuropathic pain   . Labile blood pressure   . PAF (paroxysmal atrial fibrillation) ([place])   . Hemiparesis affecting left side as late effect of stroke ([place])   . 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 Benign essential HTN   . Dyslipidemia   . Hemorrhagic stroke ([place])   . ICH (intracerebral hemorrhage) ([place]) 03/12/2018  . Prostate cancer ([place]) 01/29/2017  . 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 Diabetes  ([place]) 12/14/2015  . PCP NOTES >>>>>>>>>>>>>>>>>>>>>>>>>>>>>>. 08/06/2015  . Dizziness and giddiness 01/29/2015  . Umbilical hernia 67/20/9470  . Elevated PSA, less than 10 ng/ml 04/19/2012  . Annual physical exam 01/24/2011  . Hyperlipidemia 01/03/2010  . Essential hypertension 09/20/2007    , W. 06/04/2018, 4:15 PM  Frazier Butt., PT   [facility] [address] [place], [place], [address] Phone: [phone]   Fax:  [phone]  Name: DAKIN MADANI MRN: [id number] Date of Birth: 1945-09-22

## 2018-06-04 NOTE — Patient Outreach (Signed)
[facility]) Care Management  06/04/2018  FABRICE DYAL 11-Sep-1945 [id number]   Medication Adherence call to Mr. Sonnie Pawloski spoke with patient he explain he has been in the hospital for about a month and  was provide it with Metformin 850 mg patient has medication at this time and does not need any patient is showing past due under [place].   [facility] Management Direct Dial [phone]  Fax [phone] [email or web address]

## 2018-06-07 ENCOUNTER — Ambulatory Visit: Payer: Medicare Other | Admitting: Occupational Therapy

## 2018-06-07 ENCOUNTER — Encounter: Payer: Self-pay | Admitting: Physical Therapy

## 2018-06-07 ENCOUNTER — Ambulatory Visit: Payer: Medicare Other | Admitting: Physical Therapy

## 2018-06-07 DIAGNOSIS — R278 Other lack of coordination: Secondary | ICD-10-CM

## 2018-06-07 DIAGNOSIS — I69254 Hemiplegia and hemiparesis following other nontraumatic intracranial hemorrhage affecting left non-dominant side: Secondary | ICD-10-CM

## 2018-06-07 DIAGNOSIS — R2689 Other abnormalities of gait and mobility: Secondary | ICD-10-CM | POA: Diagnosis not present

## 2018-06-07 DIAGNOSIS — M6281 Muscle weakness (generalized): Secondary | ICD-10-CM | POA: Diagnosis not present

## 2018-06-07 DIAGNOSIS — R208 Other disturbances of skin sensation: Secondary | ICD-10-CM | POA: Diagnosis not present

## 2018-06-07 DIAGNOSIS — R2681 Unsteadiness on feet: Secondary | ICD-10-CM | POA: Diagnosis not present

## 2018-06-07 DIAGNOSIS — I69118 Other symptoms and signs involving cognitive functions following nontraumatic intracerebral hemorrhage: Secondary | ICD-10-CM | POA: Diagnosis not present

## 2018-06-07 NOTE — Patient Instructions (Signed)
   DO NOT USE LT HAND FOR ANYTHING: SHARP, HOT, BREAKABLE, OR HEAVY!  ALWAYS CHECK TEMPERATURE OF WATER WITH RT HAND BEFORE WASHING HANDS, SHOWERING, ETC.   ALWAYS LOOK AT LT HAND WHEN DOING SOMETHING SINCE YOU CAN'T FEEL IT  BE AWARE OF WHERE YOUR LT HAND IS (DON'T LET IT HANG OVER WHEELCHAIR, DON'T SIT ON IT)  1. Grip Strengthening (Resistive Putty)   Squeeze putty using thumb and all fingers. Repeat _20___ times. Do __2__ sessions per day.   2. Roll putty into tube on table and pinch between first two fingers and thumb x 10 reps. Do 2 sessions per day        Coordination Activities  Perform the following activities for 10 minutes 2 times per day with left hand(s).   Toss ball in air and catch with the same hand.  Flip cards 1 at a time as fast as you can. Go through entire deck once  Deal cards with your thumb (Hold deck in hand and push card off top with thumb).  Pick up coins, buttons, marbles, cotton balls, nuts/bolts, dried beans/pasta, etc. of different sizes and place in container.  Pick up coins and stack. Do at least 3 stacks of 5 coins  Screw together nuts and bolts, then unfasten.  Use Lt hand for SAFE functional activities. Examples include:  Picking up remote Wiping table Eating finger foods Opening lower cabinets and drawers

## 2018-06-07 NOTE — Therapy (Signed)
[facility] [address] [place], [place], [address] Phone: [phone]   Fax:  [phone]  Physical Therapy Treatment  Patient Details  Name: Marcus Beasley MRN: [id number] Date of Birth: 08-06-1945 No data recorded  Encounter Date: 06/07/2018  PT End of Session - 06/07/18 1502    Visit Number  2    Number of Visits  17    Date for PT Re-Evaluation  08/31/18    Authorization Type  [employer]HC Medicare; will need 10th visit progress notes    PT Start Time  0803    PT Stop Time  0847    PT Time Calculation (min)  44 min    Equipment Utilized During Treatment  Gait belt   brought in and used L knee cage for knee stability   Activity Tolerance  Patient tolerated treatment well    Behavior During Therapy  WFL for tasks assessed/performed       Past Medical History:  Diagnosis Date  . BPH (benign prostatic hyperplasia)    (-) Bx 2015  . Diabetes mellitus without complication ([place])   . Elevated PSA    Prostate Bx in 06/2013 was benign  . HTN (hypertension)   . Hyperlipidemia   . Macular degeneration, age related     Past Surgical History:  Procedure Laterality Date  . ABCESS DRAINAGE     abdomen- 26 day hospitalization 1968  . COLONOSCOPY  2016  . HERNIA REPAIR  summer '11   umbilical, dr Ninfa Linden   . POLYPECTOMY    . PROSTATE BIOPSY  06-2013 , 07-2017   (-), (-)    There were no vitals filed for this visit.  Subjective Assessment - 06/07/18 0805    Subjective  No changes, no falls since eval last week.    Patient is accompained by:  Family member   daughter   Limitations  Walking;Standing    Patient Stated Goals  Pt wants to be able to walk again.  Wants to be able to use L hand and arm again; wants to drive, do yardwork.    Currently in Pain?  No/denies                       [facility] Adult PT Treatment/Exercise - 06/07/18 0001      Transfers   Transfers  Sit to Stand;Stand to Sit    Sit to  Stand  4: Min guard;With upper extremity assist;From bed    Stand to Sit  4: Min guard;With upper extremity assist;To bed    Transfer Cueing  Cues for foot positioning, cues for increased forward weightshifting, increased equal weightbearing through LLE.      Ambulation/Gait   Ambulation/Gait  Yes    Ambulation/Gait Assistance  4: Min assist    Ambulation/Gait Assistance Details  Pt brings in his Swedish knee cage for LLE, wears for 120 ft x 2 reps; last rep of gait without knee cage.  Pt noted to have knee instability, but does not buckle, does not have recurvatum with gait.    Ambulation Distance (Feet)  120 Feet   x 3   Assistive device  Rolling walker   L hand trough, L Swedish knee cage   Gait Pattern  Step-through pattern;Decreased step length - left;Decreased stance time - left;Decreased dorsiflexion - left;Poor foot clearance - left;Left flexed knee in stance    Ambulation Surface  Level;Indoor    Gait Comments  PT provides cues through L  hip for increased L stance time, to initiate L hip elevation to initiate swing phase for improved foot clearance.      Neuro Re-ed    Neuro Re-ed Details   Standing weightshifting with visual cues of mirror-tactile cues for maintaining L knee position (avoiding L knee recurvatum); standing terminal knee extension with tactile cues x 10 reps in parallel bars.  In standing, with lateral weightshifting, pt needs cues to relax through L shoulder for improved standing positioning in parallel bars.      Exercises   Exercises  Knee/Hip;Ankle      Knee/Hip Exercises: Standing   Other Standing Knee Exercises  Standing lateral weightshifting for equal weightbearing x 10, then LLE as stance with RLE march x 10 reps      Knee/Hip Exercises: Seated   Ball Squeeze  10 reps    Sit to [employer]  5 reps   with knee cage, then 5 reps without     Ankle Exercises: Seated   Toe Raise  10 reps;3 seconds   LLE   Toe Raise Limitations  2nd set resisted with green  theraband    Other Seated Ankle Exercises  LLE ankle eversion 10 reps, cues to avoid hip external rotation       Verbal and tactile cues needed with seated/standing exercises for correct technique.  Synergy pattern noted with LUE with LLE seated exercises.      PT Education - 06/07/18 1501    Education Details  Initiated HEP-see instructions    Person(s) Educated  Patient    Methods  Explanation;Demonstration;Handout    Comprehension  Verbalized understanding;Returned demonstration;Verbal cues required       PT Short Term Goals - 06/04/18 1607      PT SHORT TERM GOAL #1   Title  Pt will be independent with HEP for improved strength, balance, gait.  TARGET 07/02/18 (may be modified due to scheduling around holidays)    Time  4    Period  Weeks    Status  New    Target Date  07/02/18      PT SHORT TERM GOAL #2   Title  Pt will perform at least 8 of 10 reps of sit<>stand transfers independently, minimal to no UE support, for improved lower extremity strengthening and transfer efficiency.    Time  4    Period  Weeks    Status  New    Target Date  07/02/18      PT SHORT TERM GOAL #3   Title  Pt will improve Berg Balance score to at least 27/56 for decreased fall risk.    Time  4    Period  Weeks    Status  New    Target Date  07/02/18      PT SHORT TERM GOAL #4   Title  Pt will improve TUG score to less than or equal to 25 seconds for decreased fall risk.    Time  4    Period  Weeks    Status  New    Target Date  07/02/18      PT SHORT TERM GOAL #5   Title  Pt will verbalize understanding of fall prevention in home environment.    Time  4    Period  Weeks    Status  New    Target Date  07/02/18        PT Long Term Goals - 06/04/18 1610      PT LONG TERM  GOAL #1   Title  Pt will verbalize plans for continued community fitness upon d/c from PT.  TARGET 07/30/18    Time  8    Period  Weeks    Status  New    Target Date  07/30/18      PT LONG TERM GOAL #2    Title  Pt will improve gait velocity to at least 1.8 ft/sec for improved gait efficiency and safety.    Time  8    Period  Weeks    Status  New    Target Date  07/30/18      PT LONG TERM GOAL #3   Title  Pt will improve Berg score to at least 37/56 for decreased fall risk.    Time  8    Period  Weeks    Status  New    Target Date  07/30/18      PT LONG TERM GOAL #4   Title  Pt will improve TUG score to less than or equal to 15 seconds for decreased fall risk.    Time  8    Period  Weeks    Status  New    Target Date  07/30/18      PT LONG TERM GOAL #5   Title  Pt will ambulate at least 150 ft using least restrictive assistive device, modified independently, for improved independence with gait.    Time  8    Period  Weeks    Status  New    Target Date  07/30/18      Additional Long Term Goals   Additional Long Term Goals  Yes      PT LONG TERM GOAL #6   Title  Pt will negotiate at least 12 steps, one handrail, step-through pattern, modified independently, for improved stair negotiation in home.    Time  8    Period  Weeks    Status  New    Target Date  07/30/18            Plan - 06/07/18 1503    Clinical Impression Statement  Initiated HEP this visit to address LLE strengthening and gait with knee cage.  Pt has improved stability, improved LLE stance time with cues for equal, even step length.  Pt is noted to have decreased LLE foot clearance, even with knee cage.  Pt will continue to benefit from skilled PT to address balance, strength, gait training.    Rehab Potential  Good    Clinical Impairments Affecting Rehab Potential  good family support; independnet prior to CVA    PT Frequency  2x / week    PT Duration  8 weeks   plus eval   PT Treatment/Interventions  ADLs/Self Care Home Management;Electrical Stimulation;Therapeutic exercise;Therapeutic activities;Functional mobility training;Gait training;Stair training;DME Instruction;Balance training;Neuromuscular  re-education;Patient/family education;Orthotic Fit/Training;Manual techniques    PT Next Visit Plan  Review HEP; continuing NMR and strength LLE, gait training for improved gait pattern, knee stability in stance phase    Consulted and Agree with Plan of Care  Patient;Family member/caregiver    Family Member Consulted  daughter       Patient will benefit from skilled therapeutic intervention in order to improve the following deficits and impairments:  Abnormal gait, Decreased balance, Decreased mobility, Difficulty walking, Decreased strength, Impaired tone, Postural dysfunction  Visit Diagnosis: Muscle weakness (generalized)  Unsteadiness on feet  Other abnormalities of gait and mobility     Problem List Patient Active Problem  List   Diagnosis Date Noted  . Neuropathic pain   . Labile blood pressure   . PAF (paroxysmal atrial fibrillation) ([place])   . Hemiparesis affecting left side as late effect of stroke ([place])   . 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 Benign essential HTN   . Dyslipidemia   . Hemorrhagic stroke ([place])   . ICH (intracerebral hemorrhage) ([place] [employer]) 03/12/2018  . Prostate cancer ([place]) 01/29/2017  . 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 Diabetes ([place]) 12/14/2015  . PCP NOTES >>>>>>>>>>>>>>>>>>>>>>>>>>>>>>. 08/06/2015  . Dizziness and giddiness 01/29/2015  . Umbilical hernia 89/16/9450  . Elevated PSA, less than 10 ng/ml 04/19/2012  . Annual physical exam 01/24/2011  . Hyperlipidemia 01/03/2010  . Essential hypertension 09/20/2007    MARRIOTT,AMY W. 06/07/2018, 3:07 PM  Frazier Butt., PT  Frazier Butt., PT   [facility] [address] [place], [place], [address] Phone: [phone]   Fax:  [phone]  Name: KAMARIE PALMA MRN: [id number] Date of Birth: 1945/11/04

## 2018-06-07 NOTE — Patient Instructions (Signed)
Access Code: [email or web address]  URL: [email or web address]  Date: 06/07/2018  Prepared by: Mady Haagensen   Exercises  Sit to Stand - 10 reps - 1 sets - 2x daily - 7x weekly  Seated Ankle Dorsiflexion with Resistance - 10 reps - 2 sets - 1x daily - 7x weekly

## 2018-06-07 NOTE — Therapy (Signed)
[facility] [address] [place], [place], [address] Phone: [phone]   Fax:  [phone]  Occupational Therapy Treatment  Patient Details  Name: Marcus Beasley MRN: [id number] Date of Birth: 12/25/1945 Referring Provider (OT): Venancio Poisson   Encounter Date: 06/07/2018  OT End of Session - 06/07/18 0928    Visit Number  2    Number of Visits  17    Date for OT Re-Evaluation  08/03/18    Authorization Type  UHC MCR    Authorization - Visit Number  2    Authorization - Number of Visits  10    OT Start Time  0845    OT Stop Time  0930    OT Time Calculation (min)  45 min    Activity Tolerance  Patient tolerated treatment well    Behavior During Therapy  [facility] for tasks assessed/performed       Past Medical History:  Diagnosis Date  . BPH (benign prostatic hyperplasia)    (-) Bx 2015  . Diabetes mellitus without complication ([place])   . Elevated PSA    Prostate Bx in 06/2013 was benign  . HTN (hypertension)   . Hyperlipidemia   . Macular degeneration, age related     Past Surgical History:  Procedure Laterality Date  . ABCESS DRAINAGE     abdomen- 26 day hospitalization 1968  . COLONOSCOPY  2016  . HERNIA REPAIR  summer '11   umbilical, dr Ninfa Linden   . POLYPECTOMY    . PROSTATE BIOPSY  06-2013 , 07-2017   (-), (-)    There were no vitals filed for this visit.  Subjective Assessment - 06/07/18 0850    Subjective   My LT arm feels heavy and tingling    Pertinent History  Rt thalamic hemorrhage 03/12/18. PMH: HTN, HLD, DM, A-fib    Limitations  Fall risk    Patient Stated Goals  get more function in my Lt arm and hand    Currently in Pain?  No/denies       TREATMENT:   (See below education - pt demo each from coordination and putty HEP)  Pt also performed functional reaching to place checkers in Connect 4 slots LUE w/ Rt hand assisting to prevent compensations.  Pt shown BUE sh. Flexion  exercises w/ palms together                 OT Education - 06/07/18 0925    Education Details  Coordination HEP, Putty HEP, safety considerations d/t lack of sensation LUE    Person(s) Educated  Patient;Child(ren)    Methods  Explanation;Demonstration;Handout    Comprehension  Verbalized understanding;Returned demonstration       OT Short Term Goals - 06/07/18 0928      OT SHORT TERM GOAL #1   Title  Pt independent with LUE HEP for neuro re-education - 07/03/18    Time  4    Period  Weeks    Status  New      OT SHORT TERM GOAL #2   Title  Pt independent with coordination and putty HEP Lt hand    Time  4    Period  Weeks    Status  On-going      OT SHORT TERM GOAL #3   Title  Pt to verbalize understanding with safety considerations LUE d/t lack of sensation    Time  4    Period  Weeks    Status  New      OT SHORT TERM GOAL #4   Title  Pt to cut food mod I level with A/E prn    Time  4    Period  Weeks    Status  New      OT SHORT TERM GOAL #5   Title  Pt to consistently don/doff shirt I'ly and don/doff socks I'ly w/ task modifications prn    Time  4    Period  Weeks    Status  New      OT SHORT TERM GOAL #6   Title  Pt to verbalize understanding and consistently demo correct reaching pattern LUE to prevent pain Lt shoulder    Time  4    Period  Weeks    Status  New      OT SHORT TERM GOAL #7   Title  Pt to improve coordination Lt hand as evidenced by performing 9 hole peg test in under 2 min.     Baseline  eval - placed 5 pegs in 2 min.    Time  4    Period  Weeks    Status  New        OT Long Term Goals - 06/02/18 1144      OT LONG TERM GOAL #1   Title  Pt to perform functional mid to high level reaching LUE w/ only min compensations and no pain - 08/03/18    Time  8    Period  Weeks    Status  New      OT LONG TERM GOAL #2   Title  Pt to improve Lt hand coordination as evidenced by performing 9 hole peg test in 90 sec. or under    Time   8    Period  Weeks    Status  New      OT LONG TERM GOAL #3   Title  Pt to improve grip strength Lt hand to 40 lbs or greater to assist w/ opening jars/containers    Baseline  eval: 20 lbs    Time  8    Period  Weeks    Status  New      OT LONG TERM GOAL #4   Title  Pt to demo full composite flexion Lt hand for coordination/grasping tasks    Baseline  eval: 75%    Time  8    Period  Weeks    Status  New      OT LONG TERM GOAL #5   Title  Pt to be able to tie shoe laces and hook/unhook buttons using Lt hand as assist    Time  8    Period  Weeks    Status  New      Long Term Additional Goals   Additional Long Term Goals  Yes      OT LONG TERM GOAL #6   Title  Pt to return to simple cooking and cleaning tasks from standing level w/o LOB using countertop support prn    Time  8    Period  Weeks    Status  New      OT LONG TERM GOAL #7   Title  Pt to return to financial management tasks w/ supervision prn    Time  8    Period  Weeks    Status  New            Plan - 06/07/18 1194  Clinical Impression Statement  Pt with greater understanding of safety considerations Lt hand.     Occupational Profile and client history currently impacting functional performance  PMH; HTN, HLD, DM, A-fib. Current deficits impeding pt's ability to perform ADLS, IADLS, and role as husband, friend, and leisure participation    Occupational performance deficits (Please refer to evaluation for details):  ADL's;IADL's;Leisure    Rehab Potential  Good    OT Frequency  2x / week    OT Duration  8 weeks    OT Treatment/Interventions  Self-care/ADL training;Moist Heat;DME and/or AE instruction;Splinting;Therapeutic activities;Psychosocial skills training;Aquatic Therapy;Therapeutic exercise;Cognitive remediation/compensation;Coping strategies training;Neuromuscular education;Functional Mobility Training;Passive range of motion;Visual/perceptual remediation/compensation;Manual  Therapy;Patient/family education;Electrical Stimulation    Plan  NMR LUE including closed chain/wt bearing and functional low to mid level reaching)    Consulted and Agree with Plan of Care  Patient;Family member/caregiver    Family Member Consulted  daughter       Patient will benefit from skilled therapeutic intervention in order to improve the following deficits and impairments:  Decreased coordination, Decreased range of motion, Difficulty walking, Improper body mechanics, Decreased safety awareness, Impaired sensation, Impaired tone, Decreased activity tolerance, Decreased knowledge of precautions, Impaired UE functional use, Pain, Decreased knowledge of use of DME, Decreased balance, Decreased cognition, Decreased mobility, Decreased strength  Visit Diagnosis: 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 Neuropathic pain   . Labile blood pressure   . PAF (paroxysmal atrial fibrillation) ([place])   . Hemiparesis affecting left side as late effect of stroke ([place])   . Thalamic hemorrhage ([place]) 03/17/2018  . Benign essential HTN   . Dyslipidemia   . Hemorrhagic stroke ([place])   . ICH (intracerebral hemorrhage) ([place]) 03/12/2018  . Prostate cancer ([place]) 01/29/2017  . 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 Diabetes ([place]) 12/14/2015  . PCP NOTES >>>>>>>>>>>>>>>>>>>>>>>>>>>>>>. 08/06/2015  . Dizziness and giddiness 01/29/2015  . Umbilical hernia 09/98/3382  . Elevated PSA, less than 10 ng/ml 04/19/2012  . Annual physical exam 01/24/2011  . Hyperlipidemia 01/03/2010  . Essential hypertension 09/20/2007    Carey Bullocks, OTR/L 06/07/2018, 10:54 AM  [facility] [address] [place], [place], [address] Phone: [phone]   Fax:  [phone]  Name: Marcus Beasley MRN: [id number] Date of Birth: 04-27-1946

## 2018-06-08 ENCOUNTER — Other Ambulatory Visit: Payer: Self-pay | Admitting: Adult Health

## 2018-06-09 ENCOUNTER — Ambulatory Visit: Payer: Medicare Other | Admitting: Occupational Therapy

## 2018-06-09 ENCOUNTER — Encounter: Payer: Medicare Other | Attending: Physical Medicine & Rehabilitation | Admitting: Physical Medicine & Rehabilitation

## 2018-06-09 ENCOUNTER — Encounter: Payer: Self-pay | Admitting: Physical Medicine & Rehabilitation

## 2018-06-09 VITALS — BP 144/85 | HR 69 | Ht 71.0 in | Wt 169.0 lb

## 2018-06-09 DIAGNOSIS — R208 Other disturbances of skin sensation: Secondary | ICD-10-CM

## 2018-06-09 DIAGNOSIS — R2689 Other abnormalities of gait and mobility: Secondary | ICD-10-CM | POA: Diagnosis not present

## 2018-06-09 DIAGNOSIS — G8110 Spastic hemiplegia affecting unspecified side: Secondary | ICD-10-CM | POA: Diagnosis not present

## 2018-06-09 DIAGNOSIS — I69254 Hemiplegia and hemiparesis following other nontraumatic intracranial hemorrhage affecting left non-dominant side: Secondary | ICD-10-CM | POA: Diagnosis not present

## 2018-06-09 DIAGNOSIS — M6281 Muscle weakness (generalized): Secondary | ICD-10-CM | POA: Diagnosis not present

## 2018-06-09 DIAGNOSIS — R269 Unspecified abnormalities of gait and mobility: Secondary | ICD-10-CM

## 2018-06-09 DIAGNOSIS — M792 Neuralgia and neuritis, unspecified: Secondary | ICD-10-CM | POA: Diagnosis not present

## 2018-06-09 DIAGNOSIS — I69118 Other symptoms and signs involving cognitive functions following nontraumatic intracerebral hemorrhage: Secondary | ICD-10-CM | POA: Diagnosis not present

## 2018-06-09 DIAGNOSIS — I69354 Hemiplegia and hemiparesis following cerebral infarction affecting left non-dominant side: Secondary | ICD-10-CM

## 2018-06-09 DIAGNOSIS — R2681 Unsteadiness on feet: Secondary | ICD-10-CM | POA: Diagnosis not present

## 2018-06-09 DIAGNOSIS — E119 Type 2 diabetes mellitus without complications: Secondary | ICD-10-CM | POA: Insufficient documentation

## 2018-06-09 DIAGNOSIS — I10 Essential (primary) hypertension: Secondary | ICD-10-CM | POA: Insufficient documentation

## 2018-06-09 DIAGNOSIS — I610 Nontraumatic intracerebral hemorrhage in hemisphere, subcortical: Secondary | ICD-10-CM

## 2018-06-09 DIAGNOSIS — R278 Other lack of coordination: Secondary | ICD-10-CM | POA: Diagnosis not present

## 2018-06-09 MED ORDER — BACLOFEN 10 MG PO TABS: 10.0000 mg | ORAL_TABLET | Freq: Three times a day (TID) | ORAL | 1 refills | Status: DC

## 2018-06-09 NOTE — Therapy (Signed)
[facility] [address] [place], [place], [address] Phone: [phone]   Fax:  [phone]  Occupational Therapy Treatment  Patient Details  Name: Marcus Beasley MRN: [id number] Date of Birth: 01-24-1946 Referring Provider (OT): Venancio Poisson   Encounter Date: 06/09/2018  OT End of Session - 06/09/18 0904    Visit Number  3    Number of Visits  17    Date for OT Re-Evaluation  08/03/18    Authorization Type  UHC MCR    Authorization - Visit Number  3    Authorization - Number of Visits  10    OT Start Time  0800    OT Stop Time  0845    OT Time Calculation (min)  45 min    Activity Tolerance  Patient tolerated treatment well    Behavior During Therapy  [facility] for tasks assessed/performed       Past Medical History:  Diagnosis Date  . BPH (benign prostatic hyperplasia)    (-) Bx 2015  . Diabetes mellitus without complication ([place])   . Elevated PSA    Prostate Bx in 06/2013 was benign  . HTN (hypertension)   . Hyperlipidemia   . Macular degeneration, age related     Past Surgical History:  Procedure Laterality Date  . ABCESS DRAINAGE     abdomen- 26 day hospitalization 1968  . COLONOSCOPY  2016  . HERNIA REPAIR  summer '11   umbilical, dr Ninfa Linden   . POLYPECTOMY    . PROSTATE BIOPSY  06-2013 , 07-2017   (-), (-)    There were no vitals filed for this visit.  Subjective Assessment - 06/09/18 0806    Pertinent History  Rt thalamic hemorrhage 03/12/18. PMH: HTN, HLD, DM, A-fib    Limitations  Fall risk    Patient Stated Goals  get more function in my Lt arm and hand    Currently in Pain?  Yes    Pain Score  4     Pain Location  Hand    Pain Orientation  Left    Pain Descriptors / Indicators  Burning    Pain Frequency  Intermittent    Aggravating Factors   wt bearing    Pain Relieving Factors  elevation      Discussed proper positioning for reaching - will need review/reinforcement.  Supine:  attempted bilateral shoulder flex/ext with ball but pt unable to control d/t decreased attention to Lt side and apraxia. Adapted to using PVC frame, but still unable to do correctly in sh flex/ext while maintaining elbow extension. Further adapted to using PVC Frame for chest press motion w/ greater success and cues to attend to LUE. Elbow extension against gravity w/ min facilitation, followed by attempting to maintain sh flex at 90*, and full elbow ext for supination and pronation - pt required max simple cueing to maintain elbow extension as pt would go into flexion Seated: AA/ROM LUE in sh flexion on diagonal plane using UE Ranger w/ mod facilitation and cues to prevent shoulder hiking. Closed chain body on arm movements.  Wt bearing over LT elbow followed by pushing back up using LUE w/ cues for max benefit Re-inforced w/ patient and family to use Lt hand for SAFE low reaching activities and incorporate into ADLS, and to look at hand/UE when using it (d/t decr. Sensation and attention)  OT Short Term Goals - 06/07/18 0928      OT SHORT TERM GOAL #1   Title  Pt independent with LUE HEP for neuro re-education - 07/03/18    Time  4    Period  Weeks    Status  New      OT SHORT TERM GOAL #2   Title  Pt independent with coordination and putty HEP Lt hand    Time  4    Period  Weeks    Status  On-going      OT SHORT TERM GOAL #3   Title  Pt to verbalize understanding with safety considerations LUE d/t lack of sensation    Time  4    Period  Weeks    Status  New      OT SHORT TERM GOAL #4   Title  Pt to cut food mod I level with A/E prn    Time  4    Period  Weeks    Status  New      OT SHORT TERM GOAL #5   Title  Pt to consistently don/doff shirt I'ly and don/doff socks I'ly w/ task modifications prn    Time  4    Period  Weeks    Status  New      OT SHORT TERM GOAL #6   Title  Pt to verbalize understanding and consistently demo correct  reaching pattern LUE to prevent pain Lt shoulder    Time  4    Period  Weeks    Status  New      OT SHORT TERM GOAL #7   Title  Pt to improve coordination Lt hand as evidenced by performing 9 hole peg test in under 2 min.     Baseline  eval - placed 5 pegs in 2 min.    Time  4    Period  Weeks    Status  New        OT Long Term Goals - 06/02/18 1144      OT LONG TERM GOAL #1   Title  Pt to perform functional mid to high level reaching LUE w/ only min compensations and no pain - 08/03/18    Time  8    Period  Weeks    Status  New      OT LONG TERM GOAL #2   Title  Pt to improve Lt hand coordination as evidenced by performing 9 hole peg test in 90 sec. or under    Time  8    Period  Weeks    Status  New      OT LONG TERM GOAL #3   Title  Pt to improve grip strength Lt hand to 40 lbs or greater to assist w/ opening jars/containers    Baseline  eval: 20 lbs    Time  8    Period  Weeks    Status  New      OT LONG TERM GOAL #4   Title  Pt to demo full composite flexion Lt hand for coordination/grasping tasks    Baseline  eval: 75%    Time  8    Period  Weeks    Status  New      OT LONG TERM GOAL #5   Title  Pt to be able to tie shoe laces and hook/unhook buttons using Lt hand as assist    Time  8    Period  Weeks  Status  New      Long Term Additional Goals   Additional Long Term Goals  Yes      OT LONG TERM GOAL #6   Title  Pt to return to simple cooking and cleaning tasks from standing level w/o LOB using countertop support prn    Time  8    Period  Weeks    Status  New      OT LONG TERM GOAL #7   Title  Pt to return to financial management tasks w/ supervision prn    Time  8    Period  Weeks    Status  New            Plan - 06/09/18 0904    Clinical Impression Statement  Pt demo decreased attention to Lt side, and impaired sensation LUE. Pt also w/ decreased ability to follow simple verbal instructions d/t cognitive deficits and appears to have  apraxia.     Occupational Profile and client history currently impacting functional performance  PMH; HTN, HLD, DM, A-fib. Current deficits impeding pt's ability to perform ADLS, IADLS, and role as husband, friend, and leisure participation    Occupational performance deficits (Please refer to evaluation for details):  ADL's;IADL's;Leisure    Rehab Potential  Good    OT Frequency  2x / week    OT Duration  8 weeks    OT Treatment/Interventions  Self-care/ADL training;Moist Heat;DME and/or AE instruction;Splinting;Therapeutic activities;Psychosocial skills training;Aquatic Therapy;Therapeutic exercise;Cognitive remediation/compensation;Coping strategies training;Neuromuscular education;Functional Mobility Training;Passive range of motion;Visual/perceptual remediation/compensation;Manual Therapy;Patient/family education;Electrical Stimulation    Plan  continue NMR (proximal stability, distal control) and attention to Lt side, pt may benefit from speech evaluation however has high copay - discuss w/ family next session    Consulted and Agree with Plan of Care  Patient;Family member/caregiver    Family Member Consulted  wife, daughter       Patient will benefit from skilled therapeutic intervention in order to improve the following deficits and impairments:  Decreased coordination, Decreased range of motion, Difficulty walking, Improper body mechanics, Decreased safety awareness, Impaired sensation, Impaired tone, Decreased activity tolerance, Decreased knowledge of precautions, Impaired UE functional use, Pain, Decreased knowledge of use of DME, Decreased balance, Decreased cognition, Decreased mobility, Decreased strength  Visit Diagnosis: 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 Neuropathic pain   . Labile blood pressure   . PAF (paroxysmal atrial fibrillation) ([place])   . Hemiparesis affecting left side as late effect of stroke ([place])   . Thalamic hemorrhage ([place]) 03/17/2018  . Benign essential HTN   . Dyslipidemia   . Hemorrhagic stroke ([place])   . ICH (intracerebral hemorrhage) ([place]) 03/12/2018  . Prostate cancer ([place]) 01/29/2017  . 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 Diabetes ([place]) 12/14/2015  . PCP NOTES >>>>>>>>>>>>>>>>>>>>>>>>>>>>>>. 08/06/2015  . Dizziness and giddiness 01/29/2015  . Umbilical hernia 30/86/5784  . Elevated PSA, less than 10 ng/ml 04/19/2012  . Annual physical exam 01/24/2011  . Hyperlipidemia 01/03/2010  . Essential hypertension 09/20/2007    Carey Bullocks, OTR/L 06/09/2018, 9:08 AM  [facility] [address] [place], [place], [address] Phone: [phone]   Fax:  [phone]  Name: Marcus Beasley MRN: [id number] Date of Birth: 07/29/1945

## 2018-06-09 NOTE — Progress Notes (Signed)
Subjective:    Patient ID: Marcus Beasley, male    DOB: June 04, 1946, 72 y.o.   MRN: [id number]  HPI 72 year old right-handed male with history of diabetes mellitus, hypertension presents for follow up for right thalamic hemorrhage.   Last clinic visit 04/28/18.  Since that time, patient states he is now in outpatient therapies. He saw Neurology and had a CT, with improvement. No benefit with increase in Gabapentin. Denies falls.  Pain Inventory Average Pain 6 Pain Right Now 6 My pain is burning, aching and numbness  In the last 24 hours, has pain interfered with the following? General activity 2 Relation with others 0 Enjoyment of life 4 What TIME of day is your pain at its worst? morning and night Sleep (in general) Fair  Pain is worse with: some activites Pain improves with: nothing Relief from Meds: 0  Mobility walk with assistance use a walker ability to climb steps?  yes do you drive?  no use a wheelchair transfers alone  Function retired I need assistance with the following:  dressing, bathing, household duties and shopping  Neuro/Psych weakness numbness trouble walking  Prior Studies Any changes since last visit?  no  Physicians involved in your care Any changes since last visit?  no   Family History  Problem Relation Age of Onset  . Leukemia Mother   . Heart attack Father        MI age 31  . Heart disease Sister        age 36  . Cancer - Other Sister        type  . Heart disease Brother        age 1  . Kidney disease Brother        HD  . Diabetes Maternal Grandmother   . Prostate cancer Neg Hx   . Colon cancer Neg Hx   . Esophageal cancer Neg Hx   . Rectal cancer Neg Hx   . Stomach cancer Neg Hx    Social History   Socioeconomic History  . Marital status: Married    Spouse name: Not on file  . Number of children: 3  . Years of education: 68  . Highest education level: Not on file  Occupational History  . 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 Financial resource strain: Not on file  . Food insecurity:    Worry: Not on file    Inability: Not on file  . Transportation needs:    Medical: Not on file    Non-medical: Not on file  Tobacco Use  . Smoking status: Never Smoker  . Smokeless tobacco: Never Used  Substance and Sexual Activity  . Alcohol use: Yes    Comment: occ. beer  . Drug use: No  . Sexual activity: Yes    Partners: Female  Lifestyle  . Physical activity:    Days per week: Not on file    Minutes per session: Not on file  . Stress: Not on file  Relationships  . Social connections:    Talks on phone: Not on file    Gets together: Not on file    Attends religious service: Not on file    Active member of club or organization: Not on file    Attends meetings of clubs or organizations: Not on file    Relationship status: Not on file  Other Topics Concern  . Not on file  Social History  Narrative   [employer]. [employer] - Furniture conservator/restorer. Married - '69. 2 dtrs , 1 son - homicide. 5 grandchildren.     Past Surgical History:  Procedure Laterality Date  . ABCESS DRAINAGE     abdomen- 26 day hospitalization 1968  . COLONOSCOPY  2016  . HERNIA REPAIR  summer '11   umbilical, dr Ninfa Linden   . POLYPECTOMY    . PROSTATE BIOPSY  06-2013 , 07-2017   (-), (-)   Past Medical History:  Diagnosis Date  . BPH (benign prostatic hyperplasia)    (-) Bx 2015  . Diabetes mellitus without complication ([place])   . Elevated PSA    Prostate Bx in 06/2013 was benign  . HTN (hypertension)   . Hyperlipidemia   . Macular degeneration, age related    BP (!) 144/85   Pulse 69   Ht 5\' 11"  (1.803 m)   Wt 169 lb (76.7 kg)   SpO2 96%   BMI 23.57 kg/m   Opioid Risk Score:   Fall Risk Score:  `1  Depression screen PHQ 2/9  Depression screen [facility] 2/9 04/28/2018 08/25/2017 04/14/2016 12/14/2015 08/06/2015 07/31/2014  Decreased Interest 0 0 0 0 0 0  Down, Depressed, Hopeless  0 0 0 0 0 0  PHQ - 2 Score 0 0 0 0 0 0     Review of Systems  Constitutional: Positive for unexpected weight change.  HENT: Negative.   Eyes: Negative.   Respiratory: Negative.   Cardiovascular: Negative.   Gastrointestinal: Negative.   Endocrine: Negative.   Genitourinary: Negative.   Musculoskeletal: Positive for arthralgias and gait problem.  Skin: Negative.   Allergic/Immunologic: Negative.   Neurological: Positive for weakness and numbness.  Hematological: Negative.   Psychiatric/Behavioral: Negative.   All other systems reviewed and are negative.      Objective:   Physical Exam Constitutional: No distress . Vital signs reviewed. HENT: Normocephalic.  Atraumatic. Eyes: EOMI. No discharge. Cardiovascular:  RRR. No JVD. Respiratory: CTA  bilaterally. Normal effort. GI: BS +. Non-distended. Musc: left hand edema  Neurological: He is alert and oriented Follows basic commands  Motor:  LUE: Shoulder abduction 4/5, elbow flex 4+/5, elbow extension 4/5, hand grip 3+/5 with apraxia LLE: HF, KE, ADF 4+/5 Left facial droop Emerging tone in left hand Skin: Skin is warm and dry.  Psychiatric: He has a normal mood and affect. His behavior is normal. Thought content normal.     Assessment & Plan:  72 year old right-handed male with history of diabetes mellitus, hypertension presents for follow up for right thalamic hemorrhage.   1. Left-sided weakness secondary to right thalamic hemorrhage secondary to hypertensive crisis now with spasticity  Cont therapies  Cont follow up with Neurology  Repeat CT reviewed, showing expected changes  2. Pain Management:   Tylenol as needed  Will increase Gabapentin to 600 3 times daily   Will order Baclofen 10 TID   D/c Tramadol  Encouraged ROM  3. Gait abnormality  Cont therapies  Cont walker/wheelchair for safety

## 2018-06-10 ENCOUNTER — Encounter: Payer: Self-pay | Admitting: Physical Therapy

## 2018-06-10 ENCOUNTER — Ambulatory Visit: Payer: Medicare Other | Admitting: Physical Therapy

## 2018-06-10 DIAGNOSIS — I69118 Other symptoms and signs involving cognitive functions following nontraumatic intracerebral hemorrhage: Secondary | ICD-10-CM | POA: Diagnosis not present

## 2018-06-10 DIAGNOSIS — M6281 Muscle weakness (generalized): Secondary | ICD-10-CM | POA: Diagnosis not present

## 2018-06-10 DIAGNOSIS — R2681 Unsteadiness on feet: Secondary | ICD-10-CM | POA: Diagnosis not present

## 2018-06-10 DIAGNOSIS — R2689 Other abnormalities of gait and mobility: Secondary | ICD-10-CM

## 2018-06-10 DIAGNOSIS — R208 Other disturbances of skin sensation: Secondary | ICD-10-CM | POA: Diagnosis not present

## 2018-06-10 DIAGNOSIS — I69254 Hemiplegia and hemiparesis following other nontraumatic intracranial hemorrhage affecting left non-dominant side: Secondary | ICD-10-CM | POA: Diagnosis not present

## 2018-06-10 DIAGNOSIS — R278 Other lack of coordination: Secondary | ICD-10-CM | POA: Diagnosis not present

## 2018-06-10 NOTE — Therapy (Signed)
[facility] [address] [place], [place], [address] Phone: [phone]   Fax:  [phone]  Physical Therapy Treatment  Patient Details  Name: JEREMIAS BROYHILL MRN: [id number] Date of Birth: 02/22/1946 No data recorded  Encounter Date: 06/10/2018  PT End of Session - 06/10/18 1039    Visit Number  3    Number of Visits  17    Date for PT Re-Evaluation  08/31/18    Authorization Type  [employer] Medicare; will need 10th visit progress notes    PT Start Time  [phone]   PT late due to prior eval appt running over   PT Stop Time  1015   Pt unable to stay later due to transportation   PT Time Calculation (min)  33 min    Equipment Utilized During Treatment  Gait belt   brought in and used L knee cage for knee stability   Activity Tolerance  Patient tolerated treatment well    Behavior During Therapy  WFL for tasks assessed/performed       Past Medical History:  Diagnosis Date  . BPH (benign prostatic hyperplasia)    (-) Bx 2015  . Diabetes mellitus without complication ([place])   . Elevated PSA    Prostate Bx in 06/2013 was benign  . HTN (hypertension)   . Hyperlipidemia   . Macular degeneration, age related     Past Surgical History:  Procedure Laterality Date  . ABCESS DRAINAGE     abdomen- 26 day hospitalization 1968  . COLONOSCOPY  2016  . HERNIA REPAIR  summer '11   umbilical, dr Ninfa Linden   . POLYPECTOMY    . PROSTATE BIOPSY  06-2013 , 07-2017   (-), (-)    There were no vitals filed for this visit.  Subjective Assessment - 06/10/18 0944    Subjective  No changes, no falls.    Patient is accompained by:  Family member   daughter   Limitations  Walking;Standing    Patient Stated Goals  Pt wants to be able to walk again.  Wants to be able to use L hand and arm again; wants to drive, do yardwork.    Currently in Pain?  Yes    Pain Score  4     Pain Location  Hand    Pain Orientation  Left    Pain Descriptors  / Indicators  Burning    Pain Frequency  Intermittent    Aggravating Factors   wt bearing    Pain Relieving Factors  elevation                       [facility] Adult PT Treatment/Exercise - 06/10/18 0001      Ambulation/Gait   Ambulation/Gait  Yes    Ambulation/Gait Assistance  4: Min assist    Ambulation/Gait Assistance Details  25 ft with no device-therapist in front of pt, hands on therapist shoulders, tactile and manual cues for increased LL stance time and cues for hip/knee extension through LLE (pt wearing knee cage and he continues to have knee flexion in stance)    Ambulation Distance (Feet)  200 Feet   with knee cage, rolling walker   Assistive device  Rolling walker   therapist assistance; wearing knee cage L knee   Gait Pattern  Step-through pattern;Decreased step length - left;Decreased stance time - left;Decreased dorsiflexion - left;Poor foot clearance - left;Left flexed knee in stance    Ambulation Surface  Level;Indoor    Gait Comments  L knee with knee cage remains in knee flexion in stance and valgus position.  May benefit from trial of AFO.  With cues for heelstrike, pt able to have improved foot clearance on LLE.      Knee/Hip Exercises: Standing   Functional Squat  5 reps;2 sets    Functional Squat Limitations  At counter, tactile cues for knee stability on LLE upon return to upright standing      Knee/Hip Exercises: Seated   Sit to Sand  5 reps   with knee cage; review of HEP     Ankle Exercises: Seated   Toe Raise  10 reps;3 seconds   green theraband resistance-cues to avoid supination     With gait with no device, with therapist assistance, increased instability noted with L knee in stance phase    Balance Exercises - 06/10/18 1023      Balance Exercises: Standing   Partial Tandem Stance  Eyes open;Upper extremity support 2;3 reps;10 secs   Cues for L glut activation for upright posture   Other Standing Exercises  L as stance leg with RLE  step taps to side x 10 reps, then R step taps backwards x 10 reps at counter; tactile cues at L knee to prevent buckling/recurvatum      Postural cues provided throughout standing exercises    PT Short Term Goals - 06/04/18 1607      PT SHORT TERM GOAL #1   Title  Pt will be independent with HEP for improved strength, balance, gait.  TARGET 07/02/18 (may be modified due to scheduling around holidays)    Time  4    Period  Weeks    Status  New    Target Date  07/02/18      PT SHORT TERM GOAL #2   Title  Pt will perform at least 8 of 10 reps of sit<>stand transfers independently, minimal to no UE support, for improved lower extremity strengthening and transfer efficiency.    Time  4    Period  Weeks    Status  New    Target Date  07/02/18      PT SHORT TERM GOAL #3   Title  Pt will improve Berg Balance score to at least 27/56 for decreased fall risk.    Time  4    Period  Weeks    Status  New    Target Date  07/02/18      PT SHORT TERM GOAL #4   Title  Pt will improve TUG score to less than or equal to 25 seconds for decreased fall risk.    Time  4    Period  Weeks    Status  New    Target Date  07/02/18      PT SHORT TERM GOAL #5   Title  Pt will verbalize understanding of fall prevention in home environment.    Time  4    Period  Weeks    Status  New    Target Date  07/02/18        PT Long Term Goals - 06/04/18 1610      PT LONG TERM GOAL #1   Title  Pt will verbalize plans for continued community fitness upon d/c from PT.  TARGET 07/30/18    Time  8    Period  Weeks    Status  New    Target Date  07/30/18  PT LONG TERM GOAL #2   Title  Pt will improve gait velocity to at least 1.8 ft/sec for improved gait efficiency and safety.    Time  8    Period  Weeks    Status  New    Target Date  07/30/18      PT LONG TERM GOAL #3   Title  Pt will improve Berg score to at least 37/56 for decreased fall risk.    Time  8    Period  Weeks    Status  New     Target Date  07/30/18      PT LONG TERM GOAL #4   Title  Pt will improve TUG score to less than or equal to 15 seconds for decreased fall risk.    Time  8    Period  Weeks    Status  New    Target Date  07/30/18      PT LONG TERM GOAL #5   Title  Pt will ambulate at least 150 ft using least restrictive assistive device, modified independently, for improved independence with gait.    Time  8    Period  Weeks    Status  New    Target Date  07/30/18      Additional Long Term Goals   Additional Long Term Goals  Yes      PT LONG TERM GOAL #6   Title  Pt will negotiate at least 12 steps, one handrail, step-through pattern, modified independently, for improved stair negotiation in home.    Time  8    Period  Weeks    Status  New    Target Date  07/30/18            Plan - 06/10/18 1040    Clinical Impression Statement  Reviewed HEP, with pt return demo understanding of HEP with min cues for technique.  Performed standing exercises for neuro-reed for improved knee stability; with LLE in weightbearing as stance without knee cage, pt requires tactile cueing to prevent recurvatum/knee flexion for improved knee control.  With gait using knee cage, while it provides stability, it is keeping knee flexed, preventing pt from fully activating gluts and ankle dorsiflexors for more normal gait pattern.  Pt may benefit from trial of AFOs for improved knee stability and foot clearance with gait.    Rehab Potential  Good    Clinical Impairments Affecting Rehab Potential  good family support; independnet prior to CVA    PT Frequency  2x / week    PT Duration  8 weeks   plus eval   PT Treatment/Interventions  ADLs/Self Care Home Management;Electrical Stimulation;Therapeutic exercise;Therapeutic activities;Functional mobility training;Gait training;Stair training;DME Instruction;Balance training;Neuromuscular re-education;Patient/family education;Orthotic Fit/Training;Manual techniques    PT Next  Visit Plan  Add to HEP for LLE stregnthening; continuing NMR and strength LLE, gait training for improved gait pattern, knee stability in stance phase.  Try AFO instead of knee cage ([place]/spry step)    Consulted and Agree with Plan of Care  Patient;Family member/caregiver    Family Member Consulted  granddaughter       Patient will benefit from skilled therapeutic intervention in order to improve the following deficits and impairments:  Abnormal gait, Decreased balance, Decreased mobility, Difficulty walking, Decreased strength, Impaired tone, Postural dysfunction  Visit Diagnosis: Muscle weakness (generalized)  Unsteadiness on feet  Other abnormalities of gait and mobility     Problem List Patient Active Problem List   Diagnosis  Date Noted  . Abnormality of gait 06/09/2018  . Neuropathic pain   . Labile blood pressure   . PAF (paroxysmal atrial fibrillation) ([place])   . Hemiparesis affecting left side as late effect of stroke ([place])   . 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 Benign essential HTN   . Dyslipidemia   . Hemorrhagic stroke ([place])   . ICH (intracerebral hemorrhage) ([place]) 03/12/2018  . Prostate cancer ([place]) 01/29/2017  . 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 Diabetes ([place]) 12/14/2015  . PCP NOTES >>>>>>>>>>>>>>>>>>>>>>>>>>>>>>. 08/06/2015  . Dizziness and giddiness 01/29/2015  . Umbilical hernia 11/55/2080  . Elevated PSA, less than 10 ng/ml 04/19/2012  . Annual physical exam 01/24/2011  . Hyperlipidemia 01/03/2010  . Essential hypertension 09/20/2007    Bryn Perkin W. 06/10/2018, 10:45 AM Frazier Butt., PT [facility] [address] [place], [place], [address] Phone: [phone]   Fax:  [phone]  Name: SALEM LEMBKE MRN: [id number] Date of Birth: 22-Mar-1946

## 2018-06-12 ENCOUNTER — Other Ambulatory Visit: Payer: Self-pay | Admitting: Internal Medicine

## 2018-06-14 DIAGNOSIS — I69354 Hemiplegia and hemiparesis following cerebral infarction affecting left non-dominant side: Secondary | ICD-10-CM | POA: Diagnosis not present

## 2018-06-14 NOTE — Telephone Encounter (Signed)
Spoke w/ Pamala Hurry, Pt's wife, informed Rx sent and instructed to let us know if severe dizziness. Pamala Hurry verbalized understanding.

## 2018-06-14 NOTE — Telephone Encounter (Signed)
Refill request for meclizine- no longer on med list. Please advise?  

## 2018-06-14 NOTE — Telephone Encounter (Signed)
Please let the patient know that I sent the prescription however if he has any unusual or severe dizziness he needs to be seen.

## 2018-06-22 ENCOUNTER — Ambulatory Visit: Payer: Medicare Other | Admitting: Occupational Therapy

## 2018-06-22 DIAGNOSIS — I69254 Hemiplegia and hemiparesis following other nontraumatic intracranial hemorrhage affecting left non-dominant side: Secondary | ICD-10-CM

## 2018-06-22 DIAGNOSIS — I69118 Other symptoms and signs involving cognitive functions following nontraumatic intracerebral hemorrhage: Secondary | ICD-10-CM | POA: Diagnosis not present

## 2018-06-22 DIAGNOSIS — R278 Other lack of coordination: Secondary | ICD-10-CM | POA: Diagnosis not present

## 2018-06-22 DIAGNOSIS — R2681 Unsteadiness on feet: Secondary | ICD-10-CM

## 2018-06-22 DIAGNOSIS — M6281 Muscle weakness (generalized): Secondary | ICD-10-CM | POA: Diagnosis not present

## 2018-06-22 DIAGNOSIS — R2689 Other abnormalities of gait and mobility: Secondary | ICD-10-CM | POA: Diagnosis not present

## 2018-06-22 DIAGNOSIS — R208 Other disturbances of skin sensation: Secondary | ICD-10-CM

## 2018-06-22 NOTE — Therapy (Signed)
[facility] [address] [place], [place], [address] Phone: [phone]   Fax:  [phone]  Occupational Therapy Treatment  Patient Details  Name: Marcus Beasley MRN: [id number] Date of Birth: 06/24/1945 Referring Provider (OT): Venancio Poisson   Encounter Date: 06/22/2018  OT End of Session - 06/22/18 0943    Visit Number  4    Number of Visits  17    Date for OT Re-Evaluation  08/03/18    Authorization Type  [employer] MCR    Authorization - Visit Number  4    Authorization - Number of Visits  10    OT Start Time  0800    OT Stop Time  0845    OT Time Calculation (min)  45 min    Activity Tolerance  Patient tolerated treatment well    Behavior During Therapy  [facility] for tasks assessed/performed       Past Medical History:  Diagnosis Date  . BPH (benign prostatic hyperplasia)    (-) Bx 2015  . Diabetes mellitus without complication ([place])   . Elevated PSA    Prostate Bx in 06/2013 was benign  . HTN (hypertension)   . Hyperlipidemia   . Macular degeneration, age related     Past Surgical History:  Procedure Laterality Date  . ABCESS DRAINAGE     abdomen- 26 day hospitalization 1968  . COLONOSCOPY  2016  . HERNIA REPAIR  summer '11   umbilical, dr Ninfa Linden   . POLYPECTOMY    . PROSTATE BIOPSY  06-2013 , 07-2017   (-), (-)    There were no vitals filed for this visit.  Subjective Assessment - 06/22/18 0805    Patient is accompained by:  Family member   daughter   Pertinent History  Rt thalamic hemorrhage 03/12/18. PMH: HTN, HLD, DM, A-fib    Limitations  Fall risk    Patient Stated Goals  get more function in my Lt arm and hand    Currently in Pain?  Yes    Pain Score  4     Pain Location  Hand    Pain Orientation  Left    Pain Descriptors / Indicators  Burning    Pain Onset  More than a month ago    Pain Frequency  Intermittent    Aggravating Factors   wt bearing    Pain Relieving Factors   elevation       Supine: chest press motion with PVC Frame w/ only min cues required to attend to LUE and maintain elbow extension. Progressed to sh flex/ext w/ PVC Frame w/ min facilitation/mod cueing. Elbow ext against gravity w/ visual target and cues to prevent forearm rotation Seated: wt bearing over LUE straight arm and then bent arm. Pt reports pain Lt hand (burning) w/ full arm wt bearing. Attempted closed chain arm on body movements, but pt leading movement with shoulder and unable to lead w/ hand due to pain in this position. Therefore transitioned to AA/ROM using UE Ranger in flexion and abduction gravity elim planes. Progressed to low level open chain reaching w/ min cueing                      OT Short Term Goals - 06/22/18 0943      OT SHORT TERM GOAL #1   Title  Pt independent with LUE HEP for neuro re-education - 07/03/18    Time  4    Period  Weeks    Status  On-going      OT SHORT TERM GOAL #2   Title  Pt independent with coordination and putty HEP Lt hand    Time  4    Period  Weeks    Status  On-going      OT SHORT TERM GOAL #3   Title  Pt to verbalize understanding with safety considerations LUE d/t lack of sensation    Time  4    Period  Weeks    Status  On-going      OT SHORT TERM GOAL #4   Title  Pt to cut food mod I level with A/E prn    Time  4    Period  Weeks    Status  New      OT SHORT TERM GOAL #5   Title  Pt to consistently don/doff shirt I'ly and don/doff socks I'ly w/ task modifications prn    Time  4    Period  Weeks    Status  New      OT SHORT TERM GOAL #6   Title  Pt to verbalize understanding and consistently demo correct reaching pattern LUE to prevent pain Lt shoulder    Time  4    Period  Weeks    Status  On-going      OT SHORT TERM GOAL #7   Title  Pt to improve coordination Lt hand as evidenced by performing 9 hole peg test in under 2 min.     Baseline  eval - placed 5 pegs in 2 min.    Time  4    Period   Weeks    Status  New        OT Long Term Goals - 06/02/18 1144      OT LONG TERM GOAL #1   Title  Pt to perform functional mid to high level reaching LUE w/ only min compensations and no pain - 08/03/18    Time  8    Period  Weeks    Status  New      OT LONG TERM GOAL #2   Title  Pt to improve Lt hand coordination as evidenced by performing 9 hole peg test in 90 sec. or under    Time  8    Period  Weeks    Status  New      OT LONG TERM GOAL #3   Title  Pt to improve grip strength Lt hand to 40 lbs or greater to assist w/ opening jars/containers    Baseline  eval: 20 lbs    Time  8    Period  Weeks    Status  New      OT LONG TERM GOAL #4   Title  Pt to demo full composite flexion Lt hand for coordination/grasping tasks    Baseline  eval: 75%    Time  8    Period  Weeks    Status  New      OT LONG TERM GOAL #5   Title  Pt to be able to tie shoe laces and hook/unhook buttons using Lt hand as assist    Time  8    Period  Weeks    Status  New      Long Term Additional Goals   Additional Long Term Goals  Yes      OT LONG TERM GOAL #6   Title  Pt to return to simple  cooking and cleaning tasks from standing level w/o LOB using countertop support prn    Time  8    Period  Weeks    Status  New      OT LONG TERM GOAL #7   Title  Pt to return to financial management tasks w/ supervision prn    Time  8    Period  Weeks    Status  New            Plan - 06/22/18 0944    Clinical Impression Statement  Pt with improvements in attention to Lt side and ability to follow directions today. Pt still demo apraxia and impaired sensation LUE. Pt making steady progress    Occupational Profile and client history currently impacting functional performance  PMH; HTN, HLD, DM, A-fib. Current deficits impeding pt's ability to perform ADLS, IADLS, and role as husband, friend, and leisure participation    Occupational performance deficits (Please refer to evaluation for details):   ADL's;IADL's;Leisure    Rehab Potential  Good    OT Frequency  2x / week    OT Duration  8 weeks    OT Treatment/Interventions  Self-care/ADL training;Moist Heat;DME and/or AE instruction;Splinting;Therapeutic activities;Psychosocial skills training;Aquatic Therapy;Therapeutic exercise;Cognitive remediation/compensation;Coping strategies training;Neuromuscular education;Functional Mobility Training;Passive range of motion;Visual/perceptual remediation/compensation;Manual Therapy;Patient/family education;Electrical Stimulation    Plan  continue NMR LUE, wt bearing, coordination Lt hand    Consulted and Agree with Plan of Care  Patient;Family member/caregiver    Family Member Consulted  daughter       Patient will benefit from skilled therapeutic intervention in order to improve the following deficits and impairments:  Decreased coordination, Decreased range of motion, Difficulty walking, Improper body mechanics, Decreased safety awareness, Impaired sensation, Impaired tone, Decreased activity tolerance, Decreased knowledge of precautions, Impaired UE functional use, Pain, Decreased knowledge of use of DME, Decreased balance, Decreased cognition, Decreased mobility, Decreased strength  Visit Diagnosis: 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 Abnormality of gait 06/09/2018  . Neuropathic pain   . Labile blood pressure   . PAF (paroxysmal atrial fibrillation) ([place])   . Hemiparesis affecting left side as late effect of stroke ([place])   . Thalamic hemorrhage ([place]) 03/17/2018  . Benign essential HTN   . Dyslipidemia   . Hemorrhagic stroke ([place])   . ICH (intracerebral hemorrhage) ([place]) 03/12/2018  . Prostate cancer ([place]) 01/29/2017  . Cancer of trigone  of urinary bladder ([place]) 01/29/2017  . Diabetes ([place]) 12/14/2015  . PCP NOTES >>>>>>>>>>>>>>>>>>>>>>>>>>>>>>. 08/06/2015  . Dizziness and giddiness 01/29/2015  . Umbilical hernia 82/80/0349  . Elevated PSA, less than 10 ng/ml 04/19/2012  . Annual physical exam 01/24/2011  . Hyperlipidemia 01/03/2010  . Essential hypertension 09/20/2007    Carey Bullocks, OTR/L 06/22/2018, 9:46 AM  [facility] [address] [place], [place], [address] Phone: [phone]   Fax:  [phone]  Name: Marcus Beasley MRN: [id number] Date of Birth: [birth year]

## 2018-06-23 ENCOUNTER — Other Ambulatory Visit: Payer: Self-pay | Admitting: Internal Medicine

## 2018-06-24 ENCOUNTER — Encounter: Payer: Self-pay | Admitting: Physical Therapy

## 2018-06-24 ENCOUNTER — Ambulatory Visit: Payer: Medicare Other | Attending: Internal Medicine | Admitting: Physical Therapy

## 2018-06-24 DIAGNOSIS — R2689 Other abnormalities of gait and mobility: Secondary | ICD-10-CM | POA: Diagnosis not present

## 2018-06-24 DIAGNOSIS — M6281 Muscle weakness (generalized): Secondary | ICD-10-CM | POA: Diagnosis not present

## 2018-06-24 DIAGNOSIS — R208 Other disturbances of skin sensation: Secondary | ICD-10-CM | POA: Diagnosis not present

## 2018-06-24 DIAGNOSIS — R278 Other lack of coordination: Secondary | ICD-10-CM | POA: Insufficient documentation

## 2018-06-24 DIAGNOSIS — R2681 Unsteadiness on feet: Secondary | ICD-10-CM | POA: Insufficient documentation

## 2018-06-24 DIAGNOSIS — I69254 Hemiplegia and hemiparesis following other nontraumatic intracranial hemorrhage affecting left non-dominant side: Secondary | ICD-10-CM | POA: Insufficient documentation

## 2018-06-24 NOTE — Therapy (Signed)
[facility] [address] [place], [place], [address] Phone: [phone]   Fax:  [phone]  Physical Therapy Treatment  Patient Details  Name: Marcus Beasley MRN: [id number] Date of Birth: August 07, 1945 No data recorded  Encounter Date: 06/24/2018  PT End of Session - 06/24/18 1224    Visit Number  4    Number of Visits  17    Date for PT Re-Evaluation  08/31/18    Authorization Type  [employer] Medicare; will need 10th visit progress notes    PT Start Time  0759    PT Stop Time  0847    PT Time Calculation (min)  48 min    Equipment Utilized During Treatment  Gait belt    Activity Tolerance  Patient tolerated treatment well    Behavior During Therapy  WFL for tasks assessed/performed       Past Medical History:  Diagnosis Date  . BPH (benign prostatic hyperplasia)    (-) Bx 2015  . Diabetes mellitus without complication ([place])   . Elevated PSA    Prostate Bx in 06/2013 was benign  . HTN (hypertension)   . Hyperlipidemia   . Macular degeneration, age related     Past Surgical History:  Procedure Laterality Date  . ABCESS DRAINAGE     abdomen- 26 day hospitalization 1968  . COLONOSCOPY  2016  . HERNIA REPAIR  summer '11   umbilical, dr Ninfa Linden   . POLYPECTOMY    . PROSTATE BIOPSY  06-2013 , 07-2017   (-), (-)    There were no vitals filed for this visit.  Subjective Assessment - 06/24/18 1209    Subjective  No changes, no falls since last visit.  Been trying to do exercises and walking some at home.    Patient is accompained by:  Family member   daughter   Limitations  Walking;Standing    Patient Stated Goals  Pt wants to be able to walk again.  Wants to be able to use L hand and arm again; wants to drive, do yardwork.    Currently in Pain?  No/denies                       [facility] Adult PT Treatment/Exercise - 06/24/18 0001      Ambulation/Gait   Ambulation/Gait  Yes    Ambulation/Gait  Assistance  4: Min guard;4: Min assist    Ambulation/Gait Assistance Details  Began and ended with L knee cage, then trialed Blue Rocker AFO, then [employer] AFO with L heelwedge    Ambulation Distance (Feet)  100 Feet   x 2; then 120 x 5 reps   Assistive device  Rolling walker   L hand trough; knee cage; trial of Blue rocker AFO   Gait Pattern  Step-through pattern;Decreased step length - left;Decreased stance time - left;Decreased dorsiflexion - left;Poor foot clearance - left;Left flexed knee in stance   knee instability without knee cage, with Blue rocker only   Ambulation Surface  Level;Indoor    Pre-Gait Activities  With [place] rocker and heelwedge on L, standing static weightshifting onto LLE, with tactile cues for knee extension, to avoid knee instability.   5 reps, cues for glut, quad/hamstring activation   Gait Comments  With knee cage on L, pt ambulates with L knee flexion and hip flexion, slight L foot drag.  With no cage, no orthotic x 30 ft, pt has L knee instability-recurvatum, to knee  flexion, knee giving way, with therapist assist to block.  Gait trial with Blue Rocker LLE, pt has decreased L weightshift, due to fear of knee instability, when he shifts over to L side, therapist provides tactile cues for improved knee stability.  With gait with Blue Rocker and heelwedge, improved stability in L knee, improved ability to weightshift to LLE for stance phase.  Discussed possibility of orthotist coming into PT session, for recommendations for progression to possible L AFO versus knee cage, for more optimal gait pattern.  Pt in agreement.      Knee/Hip Exercises: Stretches   Active Hamstring Stretch  Left;3 reps;30 seconds    Active Hamstring Stretch Limitations  Seated hamstring stretch, cues for positioning      Knee/Hip Exercises: Seated   Ball Squeeze  10 reps   pillow squeeze, focus on activating L adductors   Sit to Sand  5 reps   cues for L hand/foot placement, neutral knee  position       With sit<>Stand, PT assists with hand placement of LUE, cues for both hands on quads for more equal weightshift into standing.     PT Education - 06/24/18 1223    Education Details  Positioning of LLE in sitting (full foot placement, avoiding LLE external rotation); process of AFO consult    Person(s) Educated  Patient;Child(ren)    Methods  Explanation;Demonstration    Comprehension  Verbalized understanding;Returned demonstration;Verbal cues required       PT Short Term Goals - 06/04/18 1607      PT SHORT TERM GOAL #1   Title  Pt will be independent with HEP for improved strength, balance, gait.  TARGET 07/02/18 (may be modified due to scheduling around holidays)    Time  4    Period  Weeks    Status  New    Target Date  07/02/18      PT SHORT TERM GOAL #2   Title  Pt will perform at least 8 of 10 reps of sit<>stand transfers independently, minimal to no UE support, for improved lower extremity strengthening and transfer efficiency.    Time  4    Period  Weeks    Status  New    Target Date  07/02/18      PT SHORT TERM GOAL #3   Title  Pt will improve Berg Balance score to at least 27/56 for decreased fall risk.    Time  4    Period  Weeks    Status  New    Target Date  07/02/18      PT SHORT TERM GOAL #4   Title  Pt will improve TUG score to less than or equal to 25 seconds for decreased fall risk.    Time  4    Period  Weeks    Status  New    Target Date  07/02/18      PT SHORT TERM GOAL #5   Title  Pt will verbalize understanding of fall prevention in home environment.    Time  4    Period  Weeks    Status  New    Target Date  07/02/18        PT Long Term Goals - 06/04/18 1610      PT LONG TERM GOAL #1   Title  Pt will verbalize plans for continued community fitness upon d/c from PT.  TARGET 07/30/18    Time  8    Period  Weeks  Status  New    Target Date  07/30/18      PT LONG TERM GOAL #2   Title  Pt will improve gait velocity to  at least 1.8 ft/sec for improved gait efficiency and safety.    Time  8    Period  Weeks    Status  New    Target Date  07/30/18      PT LONG TERM GOAL #3   Title  Pt will improve Berg score to at least 37/56 for decreased fall risk.    Time  8    Period  Weeks    Status  New    Target Date  07/30/18      PT LONG TERM GOAL #4   Title  Pt will improve TUG score to less than or equal to 15 seconds for decreased fall risk.    Time  8    Period  Weeks    Status  New    Target Date  07/30/18      PT LONG TERM GOAL #5   Title  Pt will ambulate at least 150 ft using least restrictive assistive device, modified independently, for improved independence with gait.    Time  8    Period  Weeks    Status  New    Target Date  07/30/18      Additional Long Term Goals   Additional Long Term Goals  Yes      PT LONG TERM GOAL #6   Title  Pt will negotiate at least 12 steps, one handrail, step-through pattern, modified independently, for improved stair negotiation in home.    Time  8    Period  Weeks    Status  New    Target Date  07/30/18            Plan - 06/24/18 1224    Clinical Impression Statement  Gait training and standing neuro-reed to LLE today, with use of Blue Rocker AFO/heel wedge as alternative to knee cage on LLE.  With [employer] and heelwedge, pt is initially hesitant to weightshift to L and PT provides tactile cues for stability through L hamstrings/quads.  Pt may ultimately benefit from more use of AFO/heelwedge and AFO consult to progress away from knee cage (to allow stability through knee, L foot clearance,a nd better activation of L quads/hamstrings/gluts with gait)    Rehab Potential  Good    Clinical Impairments Affecting Rehab Potential  good family support; independnet prior to CVA    PT Frequency  2x / week    PT Duration  8 weeks   plus eval   PT Treatment/Interventions  ADLs/Self Care Home Management;Electrical Stimulation;Therapeutic  exercise;Therapeutic activities;Functional mobility training;Gait training;Stair training;DME Instruction;Balance training;Neuromuscular re-education;Patient/family education;Orthotic Fit/Training;Manual techniques    PT Next Visit Plan  Add to HEP for LLE stregnthening; continuing NMR and strength LLE, gait training for improved gait pattern, knee stability in stance phase.  Try AFO instead of knee cage (Blue Rocker/spry step)    Consulted and Agree with Plan of Care  Patient;Family member/caregiver    Family Member Consulted  daughter       Patient will benefit from skilled therapeutic intervention in order to improve the following deficits and impairments:  Abnormal gait, Decreased balance, Decreased mobility, Difficulty walking, Decreased strength, Impaired tone, Postural dysfunction  Visit Diagnosis: Other abnormalities of gait and mobility  Muscle weakness (generalized)  Unsteadiness on feet     Problem List Patient Active  Problem List   Diagnosis Date Noted  . Abnormality of gait 06/09/2018  . Neuropathic pain   . Labile blood pressure   . PAF (paroxysmal atrial fibrillation) ([place])   . Hemiparesis affecting left side as late effect of stroke ([place])   . Thalamic hemorrhage ([place]) 03/17/2018  . Benign essential HTN   . Dyslipidemia   . Hemorrhagic stroke ([place])   . ICH (intracerebral hemorrhage) ([place]) 03/12/2018  . Prostate cancer ([place]) 01/29/2017  . 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 Diabetes ([place]) 12/14/2015  . PCP NOTES >>>>>>>>>>>>>>>>>>>>>>>>>>>>>>. 08/06/2015  . Dizziness and giddiness 01/29/2015  . Umbilical hernia 14/70/9295  . Elevated PSA, less than 10 ng/ml 04/19/2012  . Annual physical exam 01/24/2011  . Hyperlipidemia 01/03/2010  . Essential hypertension 09/20/2007    Rajiv Parlato W. 06/24/2018, 12:29 PM  Frazier Butt., PT   [facility] [address] [place], [place],  [address] Phone: [phone]   Fax:  [phone]  Name: Marcus Beasley MRN: [id number] Date of Birth: [birth year]

## 2018-06-28 ENCOUNTER — Ambulatory Visit: Payer: Medicare Other | Admitting: Occupational Therapy

## 2018-06-28 ENCOUNTER — Ambulatory Visit: Payer: Medicare Other | Admitting: Physical Therapy

## 2018-06-28 DIAGNOSIS — R208 Other disturbances of skin sensation: Secondary | ICD-10-CM

## 2018-06-28 DIAGNOSIS — I69254 Hemiplegia and hemiparesis following other nontraumatic intracranial hemorrhage affecting left non-dominant side: Secondary | ICD-10-CM

## 2018-06-28 DIAGNOSIS — R2689 Other abnormalities of gait and mobility: Secondary | ICD-10-CM | POA: Diagnosis not present

## 2018-06-28 DIAGNOSIS — M6281 Muscle weakness (generalized): Secondary | ICD-10-CM

## 2018-06-28 DIAGNOSIS — R278 Other lack of coordination: Secondary | ICD-10-CM

## 2018-06-28 DIAGNOSIS — R2681 Unsteadiness on feet: Secondary | ICD-10-CM | POA: Diagnosis not present

## 2018-06-28 NOTE — Therapy (Signed)
[facility] [address] [place], [place], [address] Phone: [phone]   Fax:  [phone]  Occupational Therapy Treatment  Patient Details  Name: Marcus Beasley MRN: [id number] Date of Birth: Apr 23, 1946 Referring Provider (OT): Venancio Poisson   Encounter Date: 06/28/2018  OT End of Session - 06/28/18 1335    Visit Number  5    Number of Visits  17    Date for OT Re-Evaluation  08/03/18    Authorization Type  [employer] MCR    Authorization - Visit Number  5    Authorization - Number of Visits  10    OT Start Time  0930    OT Stop Time  1015    OT Time Calculation (min)  45 min    Activity Tolerance  Patient tolerated treatment well    Behavior During Therapy  [facility] for tasks assessed/performed       Past Medical History:  Diagnosis Date  . BPH (benign prostatic hyperplasia)    (-) Bx 2015  . Diabetes mellitus without complication ([place])   . Elevated PSA    Prostate Bx in 06/2013 was benign  . HTN (hypertension)   . Hyperlipidemia   . Macular degeneration, age related     Past Surgical History:  Procedure Laterality Date  . ABCESS DRAINAGE     abdomen- 26 day hospitalization 1968  . COLONOSCOPY  2016  . HERNIA REPAIR  summer '11   umbilical, dr Ninfa Linden   . POLYPECTOMY    . PROSTATE BIOPSY  06-2013 , 07-2017   (-), (-)    There were no vitals filed for this visit.  Subjective Assessment - 06/28/18 0937    Subjective   No pain just stiffness    Pertinent History  Rt thalamic hemorrhage 03/12/18. PMH: HTN, HLD, DM, A-fib    Limitations  Fall risk    Patient Stated Goals  get more function in my Lt arm and hand    Currently in Pain?  No/denies       Treatment:   Neuro Re-educ: Supine - focused on BUE sh flexion with use of thick hard foam (forearms neutral) and attention to LUE w/ min facilitation given. Pt fatigued quickly LUE Therapeutic Act: Seated, low level reaching LUE to place checkers into  Connect 4 slots focusing on normal movement patterns, isolated forearm rotation, and coordination Self Care: Discussed proper stretching of hand in flexion (keeping MP's straight to focus on PIP flexion) due to tightness. Also discussed/reviewed functional activities at home he can safely do with LUE/hand, and reviewed things to avoid for safety and d/t lack of sensation Fluidotherapy x 8 min. while working Lt hand in flexion/extension                      [facility] - 06/22/18 0943      OT [place] #1   Title  Pt independent with LUE HEP for neuro re-education - 07/03/18    Time  4    Period  Weeks    Status  On-going      OT SHORT TERM GOAL #2   Title  Pt independent with coordination and putty HEP Lt hand    Time  4    Period  Weeks    Status  On-going      OT SHORT TERM GOAL #3   Title  Pt to verbalize understanding with safety considerations LUE d/t lack of  sensation    Time  4    Period  Weeks    Status  On-going      OT SHORT TERM GOAL #4   Title  Pt to cut food mod I level with A/E prn    Time  4    Period  Weeks    Status  New      OT SHORT TERM GOAL #5   Title  Pt to consistently don/doff shirt I'ly and don/doff socks I'ly w/ task modifications prn    Time  4    Period  Weeks    Status  New      OT SHORT TERM GOAL #6   Title  Pt to verbalize understanding and consistently demo correct reaching pattern LUE to prevent pain Lt shoulder    Time  4    Period  Weeks    Status  On-going      OT SHORT TERM GOAL #7   Title  Pt to improve coordination Lt hand as evidenced by performing 9 hole peg test in under 2 min.     Baseline  eval - placed 5 pegs in 2 min.    Time  4    Period  Weeks    Status  New        OT Long Term Goals - 06/02/18 1144      OT LONG TERM GOAL #1   Title  Pt to perform functional mid to high level reaching LUE w/ only min compensations and no pain - 08/03/18    Time  8    Period  Weeks    Status  New       OT LONG TERM GOAL #2   Title  Pt to improve Lt hand coordination as evidenced by performing 9 hole peg test in 90 sec. or under    Time  8    Period  Weeks    Status  New      OT LONG TERM GOAL #3   Title  Pt to improve grip strength Lt hand to 40 lbs or greater to assist w/ opening jars/containers    Baseline  eval: 20 lbs    Time  8    Period  Weeks    Status  New      OT LONG TERM GOAL #4   Title  Pt to demo full composite flexion Lt hand for coordination/grasping tasks    Baseline  eval: 75%    Time  8    Period  Weeks    Status  New      OT LONG TERM GOAL #5   Title  Pt to be able to tie shoe laces and hook/unhook buttons using Lt hand as assist    Time  8    Period  Weeks    Status  New      Long Term Additional Goals   Additional Long Term Goals  Yes      OT LONG TERM GOAL #6   Title  Pt to return to simple cooking and cleaning tasks from standing level w/o LOB using countertop support prn    Time  8    Period  Weeks    Status  New      OT LONG TERM GOAL #7   Title  Pt to return to financial management tasks w/ supervision prn    Time  8    Period  Weeks    Status  New  Plan - 06/28/18 1336    Clinical Impression Statement  Pt continues to make steady progress with LUE.     Occupational Profile and client history currently impacting functional performance  PMH; HTN, HLD, DM, A-fib. Current deficits impeding pt's ability to perform ADLS, IADLS, and role as husband, friend, and leisure participation    Occupational performance deficits (Please refer to evaluation for details):  ADL's;IADL's;Leisure    Rehab Potential  Good    OT Frequency  2x / week    OT Duration  8 weeks    OT Treatment/Interventions  Self-care/ADL training;Moist Heat;DME and/or AE instruction;Splinting;Therapeutic activities;Psychosocial skills training;Aquatic Therapy;Therapeutic exercise;Cognitive remediation/compensation;Coping strategies training;Neuromuscular  education;Functional Mobility Training;Passive range of motion;Visual/perceptual remediation/compensation;Manual Therapy;Patient/family education;Electrical Stimulation    Plan  consider wrapping hand in flexion for PIP's and place in fluido, continue NMR LUE, wt bearing, coordination Lt hand, begin assessing progress towards STG's    Consulted and Agree with Plan of Care  Patient       Patient will benefit from skilled therapeutic intervention in order to improve the following deficits and impairments:  Decreased coordination, Decreased range of motion, Difficulty walking, Improper body mechanics, Decreased safety awareness, Impaired sensation, Impaired tone, Decreased activity tolerance, Decreased knowledge of precautions, Impaired UE functional use, Pain, Decreased knowledge of use of DME, Decreased balance, Decreased cognition, Decreased mobility, Decreased strength  Visit Diagnosis: 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 Abnormality of gait 06/09/2018  . Neuropathic pain   . Labile blood pressure   . PAF (paroxysmal atrial fibrillation) ([place])   . Hemiparesis affecting left side as late effect of stroke ([place])   . Thalamic hemorrhage ([place]) 03/17/2018  . Benign essential HTN   . Dyslipidemia   . Hemorrhagic stroke ([place])   . ICH (intracerebral hemorrhage) ([place]) 03/12/2018  . Prostate cancer ([place]) 01/29/2017  . 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 Diabetes ([place]) 12/14/2015  . PCP NOTES >>>>>>>>>>>>>>>>>>>>>>>>>>>>>>. 08/06/2015  . Dizziness and giddiness 01/29/2015  . Umbilical hernia 41/93/7902  . Elevated PSA, less than 10 ng/ml 04/19/2012  . Annual physical exam 01/24/2011  . Hyperlipidemia 01/03/2010  .  Essential hypertension 09/20/2007    Carey Bullocks, OTR/L 06/28/2018, 1:44 PM  [facility] [address] [place], [place], [address] Phone: [phone]   Fax:  [phone]  Name: Marcus Beasley MRN: [id number] Date of Birth: 26-Aug-1945

## 2018-06-30 ENCOUNTER — Ambulatory Visit: Payer: Medicare Other | Admitting: Occupational Therapy

## 2018-06-30 ENCOUNTER — Encounter: Payer: Self-pay | Admitting: Physical Therapy

## 2018-06-30 ENCOUNTER — Ambulatory Visit: Payer: Medicare Other | Admitting: Physical Therapy

## 2018-06-30 DIAGNOSIS — R278 Other lack of coordination: Secondary | ICD-10-CM

## 2018-06-30 DIAGNOSIS — R2681 Unsteadiness on feet: Secondary | ICD-10-CM | POA: Diagnosis not present

## 2018-06-30 DIAGNOSIS — I69254 Hemiplegia and hemiparesis following other nontraumatic intracranial hemorrhage affecting left non-dominant side: Secondary | ICD-10-CM

## 2018-06-30 DIAGNOSIS — R208 Other disturbances of skin sensation: Secondary | ICD-10-CM | POA: Diagnosis not present

## 2018-06-30 DIAGNOSIS — R2689 Other abnormalities of gait and mobility: Secondary | ICD-10-CM | POA: Diagnosis not present

## 2018-06-30 DIAGNOSIS — M6281 Muscle weakness (generalized): Secondary | ICD-10-CM | POA: Diagnosis not present

## 2018-06-30 NOTE — Therapy (Signed)
[facility] [address] [place], [place], [address] Phone: [phone]   Fax:  [phone]  Occupational Therapy Treatment  Patient Details  Name: Marcus Beasley MRN: [id number] Date of Birth: April 22, 1946 Referring Provider (OT): Venancio Poisson   Encounter Date: 06/30/2018  OT End of Session - 06/30/18 1142    Visit Number  6    Number of Visits  17    Date for OT Re-Evaluation  08/03/18    Authorization Type  [employer] MCR    Authorization - Visit Number  6    Authorization - Number of Visits  10    OT Start Time  0930    OT Stop Time  1015    OT Time Calculation (min)  45 min    Activity Tolerance  Patient tolerated treatment well    Behavior During Therapy  [facility] for tasks assessed/performed       Past Medical History:  Diagnosis Date  . BPH (benign prostatic hyperplasia)    (-) Bx 2015  . Diabetes mellitus without complication ([place])   . Elevated PSA    Prostate Bx in 06/2013 was benign  . HTN (hypertension)   . Hyperlipidemia   . Macular degeneration, age related     Past Surgical History:  Procedure Laterality Date  . ABCESS DRAINAGE     abdomen- 26 day hospitalization 1968  . COLONOSCOPY  2016  . HERNIA REPAIR  summer '11   umbilical, dr Ninfa Linden   . POLYPECTOMY    . PROSTATE BIOPSY  06-2013 , 07-2017   (-), (-)    There were no vitals filed for this visit.  Subjective Assessment - 06/30/18 0940    Subjective   My hand hurts     Pertinent History  Rt thalamic hemorrhage 03/12/18. PMH: HTN, HLD, DM, A-fib    Limitations  Fall risk    Patient Stated Goals  get more function in my Lt arm and hand                   OT Treatments/Exercises (OP) - 06/30/18 0001      Exercises   Exercises  Hand      Neurological Re-education Exercises   Other Weight-Bearing Exercises 1  wt bearing over LUE: full arm with body on arm movements and bent arm w/ body on arm movements focusing on sh girdle  activation and pushing up through hand      Modalities   Modalities  Fluidotherapy      LUE Fluidotherapy   Number Minutes Fluidotherapy  10 Minutes    LUE Fluidotherapy Location  Hand   wrapped for PIP flexion   Comments  to decr. stiffness at PIP joints      Fine Motor Coordination (Hand/Wrist)   Fine Motor Coordination  Flipping cards;Dealing card with thumb    Flipping cards  Lt hand for finger movement and forearm supination    Dealing card with thumb  w/ max difficulty and cues to prevent sh compensation               OT Short Term Goals - 06/30/18 1142      OT SHORT TERM GOAL #1   Title  Pt independent with LUE HEP for neuro re-education - 07/03/18    Time  4    Period  Weeks    Status  On-going      OT SHORT TERM GOAL #2   Title  Pt independent with  coordination and putty HEP Lt hand    Time  4    Period  Weeks    Status  Achieved      OT SHORT TERM GOAL #3   Title  Pt to verbalize understanding with safety considerations LUE d/t lack of sensation    Time  4    Period  Weeks    Status  Achieved      OT SHORT TERM GOAL #4   Title  Pt to cut food mod I level with A/E prn    Time  4    Period  Weeks    Status  New      OT SHORT TERM GOAL #5   Title  Pt to consistently don/doff shirt I'ly and don/doff socks I'ly w/ task modifications prn    Time  4    Period  Weeks    Status  On-going      OT SHORT TERM GOAL #6   Title  Pt to verbalize understanding and consistently demo correct reaching pattern LUE to prevent pain Lt shoulder    Time  4    Period  Weeks    Status  On-going      OT SHORT TERM GOAL #7   Title  Pt to improve coordination Lt hand as evidenced by performing 9 hole peg test in under 2 min.     Baseline  eval - placed 5 pegs in 2 min.    Time  4    Period  Weeks    Status  On-going        OT Long Term Goals - 06/02/18 1144      OT LONG TERM GOAL #1   Title  Pt to perform functional mid to high level reaching LUE w/ only min  compensations and no pain - 08/03/18    Time  8    Period  Weeks    Status  New      OT LONG TERM GOAL #2   Title  Pt to improve Lt hand coordination as evidenced by performing 9 hole peg test in 90 sec. or under    Time  8    Period  Weeks    Status  New      OT LONG TERM GOAL #3   Title  Pt to improve grip strength Lt hand to 40 lbs or greater to assist w/ opening jars/containers    Baseline  eval: 20 lbs    Time  8    Period  Weeks    Status  New      OT LONG TERM GOAL #4   Title  Pt to demo full composite flexion Lt hand for coordination/grasping tasks    Baseline  eval: 75%    Time  8    Period  Weeks    Status  New      OT LONG TERM GOAL #5   Title  Pt to be able to tie shoe laces and hook/unhook buttons using Lt hand as assist    Time  8    Period  Weeks    Status  New      Long Term Additional Goals   Additional Long Term Goals  Yes      OT LONG TERM GOAL #6   Title  Pt to return to simple cooking and cleaning tasks from standing level w/o LOB using countertop support prn    Time  8    Period  Weeks    Status  New      OT LONG TERM GOAL #7   Title  Pt to return to financial management tasks w/ supervision prn    Time  8    Period  Weeks    Status  New            Plan - 06/30/18 1143    Clinical Impression Statement  Pt has met STG's #2 and #3. Pt making steady progress towards remaining STG's.     Occupational Profile and client history currently impacting functional performance  PMH; HTN, HLD, DM, A-fib. Current deficits impeding pt's ability to perform ADLS, IADLS, and role as husband, friend, and leisure participation    Occupational performance deficits (Please refer to evaluation for details):  ADL's;IADL's;Leisure    Rehab Potential  Good    OT Frequency  2x / week    OT Duration  8 weeks    OT Treatment/Interventions  Self-care/ADL training;Moist Heat;DME and/or AE instruction;Splinting;Therapeutic activities;Psychosocial skills  training;Aquatic Therapy;Therapeutic exercise;Cognitive remediation/compensation;Coping strategies training;Neuromuscular education;Functional Mobility Training;Passive range of motion;Visual/perceptual remediation/compensation;Manual Therapy;Patient/family education;Electrical Stimulation    Plan  issue formal HEP for shoulder (STG #1), practice use of rocker knife (STG #4), practice UE/LE dressing (following session: NMR LUE, A/E recommendations, and coordination w/ large or medium sized pegs)     Consulted and Agree with Plan of Care  Patient       Patient will benefit from skilled therapeutic intervention in order to improve the following deficits and impairments:  Decreased coordination, Decreased range of motion, Difficulty walking, Improper body mechanics, Decreased safety awareness, Impaired sensation, Impaired tone, Decreased activity tolerance, Decreased knowledge of precautions, Impaired UE functional use, Pain, Decreased knowledge of use of DME, Decreased balance, Decreased cognition, Decreased mobility, Decreased strength  Visit Diagnosis: 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 Abnormality of gait 06/09/2018  . Neuropathic pain   . Labile blood pressure   . PAF (paroxysmal atrial fibrillation) ([place])   . Hemiparesis affecting left side as late effect of stroke ([place])   . Thalamic hemorrhage ([place]) 03/17/2018  . Benign essential HTN   . Dyslipidemia   . Hemorrhagic stroke ([place])   . ICH (intracerebral hemorrhage) ([place]) 03/12/2018  . Prostate cancer ([place]) 01/29/2017  . 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 Diabetes ([place]) 12/14/2015  . PCP NOTES >>>>>>>>>>>>>>>>>>>>>>>>>>>>>>. 08/06/2015  . Dizziness and giddiness  01/29/2015  . Umbilical hernia 69/62/9528  . Elevated PSA, less than 10 ng/ml 04/19/2012  . Annual physical exam 01/24/2011  . Hyperlipidemia 01/03/2010  . Essential hypertension 09/20/2007    Carey Bullocks, OTR/L 06/30/2018, 11:45 AM  [facility] [address] [place], [place], [address] Phone: [phone]   Fax:  [phone]  Name: COURTNEY FENLON MRN: [id number] Date of Birth: 1945-10-10

## 2018-06-30 NOTE — Therapy (Signed)
[facility] [address] [place], [place], [address] Phone: [phone]   Fax:  [phone]  Physical Therapy Treatment  Patient Details  Name: Marcus Beasley MRN: [id number] Date of Birth: [birth year] No data recorded  Encounter Date: 06/30/2018  PT End of Session - 06/30/18 1545    Visit Number  5    Number of Visits  17    Date for PT Re-Evaluation  08/31/18    Authorization Type  [employer]HC Medicare; will need 10th visit progress notes    PT Start Time  0850    PT Stop Time  0932    PT Time Calculation (min)  42 min    Equipment Utilized During Treatment  Gait belt    Activity Tolerance  Patient tolerated treatment well    Behavior During Therapy  WFL for tasks assessed/performed       Past Medical History:  Diagnosis Date  . BPH (benign prostatic hyperplasia)    (-) Bx 2015  . Diabetes mellitus without complication ([place])   . Elevated PSA    Prostate Bx in 06/2013 was benign  . HTN (hypertension)   . Hyperlipidemia   . Macular degeneration, age related     Past Surgical History:  Procedure Laterality Date  . ABCESS DRAINAGE     abdomen- 26 day hospitalization 1968  . COLONOSCOPY  2016  . HERNIA REPAIR  summer '11   umbilical, dr Ninfa Linden   . POLYPECTOMY    . PROSTATE BIOPSY  06-2013 , 07-2017   (-), (-)    There were no vitals filed for this visit.  Subjective Assessment - 06/30/18 0855    Subjective  No changes since last visit.    Patient is accompained by:  Family member   friend   Limitations  Walking;Standing    Patient Stated Goals  Pt wants to be able to walk again.  Wants to be able to use L hand and arm again; wants to drive, do yardwork.    Currently in Pain?  No/denies                       [facility] Adult PT Treatment/Exercise - 06/30/18 1538      Ambulation/Gait   Ambulation/Gait  Yes    Ambulation/Gait Assistance  4: Min guard;4: Min assist    Ambulation/Gait Assistance  Details  Attempted gait with Spry Step AFO (ground reaction), but pt did not feel comfortable with this.  Transitioned to L Blue Rocker AFO with heelwedge, as in previous PT session.    Ambulation Distance (Feet)  220 Feet   then 100   Assistive device  Rolling walker   L hand trough (pt c/o feeling uncomfortable in LUE today)   Gait Pattern  Step-through pattern;Decreased step length - left;Decreased stance time - left;Decreased dorsiflexion - left;Poor foot clearance - left;Left flexed knee in stance;Trunk rotated posteriorly on left   improved with Blue rocker AFO and heel wedge   Ambulation Surface  Level;Indoor    Pre-Gait Activities  Standing at sink with Blue Rocker AFO and L heelwedge, lateral weightshifting, with tactile, then VCs for engag pelvic musculature for (headlights forward), to lessen posterior rotation of pelvis on L side with weightshifting.  Standing in equal weightbearing position with terminal knee extension, glut sets 2 sets x 5 reps with 3 sec hold.  Progressed to standing beside sink, holding with RUE only, with LLE as stance, RLE hip/knee flexion 2 sets  x 5 reps (VCs only to straighten up tall through L side)    Gait Comments  After pre-gait activities at counter, pt appears to trust LLE more and responds well to cues for "headlights pointing straight ahead" to allow for less posterior rotation of L pelvis.  Discussed AFO consult and pt in agreement.             PT Education - 06/30/18 1544    Education Details  Cues for pelvic positioning for neutral pelvis in standing (to avoid L posterior pelvic rotation); pt in agreement with AFO consult    Person(s) Educated  Patient;Other (comment)   Friend   Methods  Explanation;Demonstration;Verbal cues;Tactile cues    Comprehension  Verbalized understanding;Returned demonstration;Verbal cues required       PT Short Term Goals - 06/04/18 1607      PT SHORT TERM GOAL #1   Title  Pt will be independent with HEP for  improved strength, balance, gait.  TARGET 07/02/18 (may be modified due to scheduling around holidays)    Time  4    Period  Weeks    Status  New    Target Date  07/02/18      PT SHORT TERM GOAL #2   Title  Pt will perform at least 8 of 10 reps of sit<>stand transfers independently, minimal to no UE support, for improved lower extremity strengthening and transfer efficiency.    Time  4    Period  Weeks    Status  New    Target Date  07/02/18      PT SHORT TERM GOAL #3   Title  Pt will improve Berg Balance score to at least 27/56 for decreased fall risk.    Time  4    Period  Weeks    Status  New    Target Date  07/02/18      PT SHORT TERM GOAL #4   Title  Pt will improve TUG score to less than or equal to 25 seconds for decreased fall risk.    Time  4    Period  Weeks    Status  New    Target Date  07/02/18      PT SHORT TERM GOAL #5   Title  Pt will verbalize understanding of fall prevention in home environment.    Time  4    Period  Weeks    Status  New    Target Date  07/02/18        PT Long Term Goals - 06/04/18 1610      PT LONG TERM GOAL #1   Title  Pt will verbalize plans for continued community fitness upon d/c from PT.  TARGET 07/30/18    Time  8    Period  Weeks    Status  New    Target Date  07/30/18      PT LONG TERM GOAL #2   Title  Pt will improve gait velocity to at least 1.8 ft/sec for improved gait efficiency and safety.    Time  8    Period  Weeks    Status  New    Target Date  07/30/18      PT LONG TERM GOAL #3   Title  Pt will improve Berg score to at least 37/56 for decreased fall risk.    Time  8    Period  Weeks    Status  New    Target Date  07/30/18      PT LONG TERM GOAL #4   Title  Pt will improve TUG score to less than or equal to 15 seconds for decreased fall risk.    Time  8    Period  Weeks    Status  New    Target Date  07/30/18      PT LONG TERM GOAL #5   Title  Pt will ambulate at least 150 ft using least restrictive  assistive device, modified independently, for improved independence with gait.    Time  8    Period  Weeks    Status  New    Target Date  07/30/18      Additional Long Term Goals   Additional Long Term Goals  Yes      PT LONG TERM GOAL #6   Title  Pt will negotiate at least 12 steps, one handrail, step-through pattern, modified independently, for improved stair negotiation in home.    Time  8    Period  Weeks    Status  New    Target Date  07/30/18            Plan - 06/30/18 1546    Clinical Impression Statement  Began session with standing trial only of Spry Step ground reaction AFO, and due to discomfort, discontinued this trial.  Transitioned to use of Blue Rocker AFO and heelwedge on LLE as in previous session, with not having any episodes during gait of L knee buckling (PT provided close supervision only at knee, not tactile/manual cues as last session).  Pre-gait activities at sink focused on hip/pelvis positioning for more optimal activation of LLE musculature in weightbearing, and pt's gait after this activities noted to improve with LLE stance time, RLE step length and overall improved continuous pattern with gait.  Pt will continue to benefit from skilled PT to address strength, balance, gait for further progression towards independence.    Rehab Potential  Good    Clinical Impairments Affecting Rehab Potential  good family support; independnet prior to CVA    PT Frequency  2x / week    PT Duration  8 weeks   plus eval   PT Treatment/Interventions  ADLs/Self Care Home Management;Electrical Stimulation;Therapeutic exercise;Therapeutic activities;Functional mobility training;Gait training;Stair training;DME Instruction;Balance training;Neuromuscular re-education;Patient/family education;Orthotic Fit/Training;Manual techniques    PT Next Visit Plan  Pre-gait activities for LLE weightbearing, continue use of Blue Rocker and heelwedge on LLE for gait; try UpWalker next visit to  help with LUE pain/positioning?; add to HEP as able for LLE strengthening    Recommended Other Services  Pt in agreement with AFO consult    Consulted and Agree with Plan of Care  Patient;Family member/caregiver    Family Member Consulted  friend       Patient will benefit from skilled therapeutic intervention in order to improve the following deficits and impairments:  Abnormal gait, Decreased balance, Decreased mobility, Difficulty walking, Decreased strength, Impaired tone, Postural dysfunction  Visit Diagnosis: Other abnormalities of gait and mobility  Unsteadiness on feet     Problem List Patient Active Problem List   Diagnosis Date Noted  . Abnormality of gait 06/09/2018  . Neuropathic pain   . Labile blood pressure   . PAF (paroxysmal atrial fibrillation) ([place])   . Hemiparesis affecting left side as late effect of stroke ([place])   . Thalamic hemorrhage ([place]) 03/17/2018  . Benign essential HTN   . Dyslipidemia   . Hemorrhagic stroke ([place])   .  ICH (intracerebral hemorrhage) ([place]) 03/12/2018  . Prostate cancer ([place]) 01/29/2017  . 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 Diabetes ([place]) 12/14/2015  . PCP NOTES >>>>>>>>>>>>>>>>>>>>>>>>>>>>>>. 08/06/2015  . Dizziness and giddiness 01/29/2015  . Umbilical hernia 43/88/8757  . Elevated PSA, less than 10 ng/ml 04/19/2012  . Annual physical exam 01/24/2011  . Hyperlipidemia 01/03/2010  . Essential hypertension 09/20/2007    MARRIOTT,AMY W. 06/30/2018, 3:51 PM  Frazier Butt., PT   [facility] [address] [place], [place], [address] Phone: [phone]   Fax:  [phone]  Name: Marcus Beasley MRN: [id number] Date of Birth: 1946-04-29

## 2018-07-02 ENCOUNTER — Ambulatory Visit: Payer: Medicare Other | Admitting: Physical Therapy

## 2018-07-02 ENCOUNTER — Encounter: Payer: Self-pay | Admitting: Physical Therapy

## 2018-07-02 ENCOUNTER — Telehealth: Payer: Self-pay | Admitting: Physical Therapy

## 2018-07-02 DIAGNOSIS — R278 Other lack of coordination: Secondary | ICD-10-CM | POA: Diagnosis not present

## 2018-07-02 DIAGNOSIS — R2681 Unsteadiness on feet: Secondary | ICD-10-CM | POA: Diagnosis not present

## 2018-07-02 DIAGNOSIS — I69254 Hemiplegia and hemiparesis following other nontraumatic intracranial hemorrhage affecting left non-dominant side: Secondary | ICD-10-CM | POA: Diagnosis not present

## 2018-07-02 DIAGNOSIS — M6281 Muscle weakness (generalized): Secondary | ICD-10-CM | POA: Diagnosis not present

## 2018-07-02 DIAGNOSIS — R208 Other disturbances of skin sensation: Secondary | ICD-10-CM | POA: Diagnosis not present

## 2018-07-02 DIAGNOSIS — R2689 Other abnormalities of gait and mobility: Secondary | ICD-10-CM | POA: Diagnosis not present

## 2018-07-02 NOTE — Therapy (Signed)
[facility] [address] [place], [place], [address] Phone: [phone]   Fax:  [phone]  Physical Therapy Treatment  Patient Details  Name: Marcus Beasley MRN: [id number] Date of Birth: [birth year] No data recorded  Encounter Date: 07/02/2018  PT End of Session - 07/02/18 1613    Visit Number  6    Number of Visits  17    Date for PT Re-Evaluation  08/31/18    Authorization Type  [employer] Medicare; will need 10th visit progress notes    PT Start Time  0934    PT Stop Time  1019    PT Time Calculation (min)  45 min    Equipment Utilized During Treatment  Gait belt    Activity Tolerance  Patient tolerated treatment well    Behavior During Therapy  WFL for tasks assessed/performed       Past Medical History:  Diagnosis Date  . BPH (benign prostatic hyperplasia)    (-) Bx 2015  . Diabetes mellitus without complication ([place])   . Elevated PSA    Prostate Bx in 06/2013 was benign  . HTN (hypertension)   . Hyperlipidemia   . Macular degeneration, age related     Past Surgical History:  Procedure Laterality Date  . ABCESS DRAINAGE     abdomen- 26 day hospitalization 1968  . COLONOSCOPY  2016  . HERNIA REPAIR  summer '11   umbilical, dr Ninfa Linden   . POLYPECTOMY    . PROSTATE BIOPSY  06-2013 , 07-2017   (-), (-)    There were no vitals filed for this visit.  Subjective Assessment - 07/02/18 0936    Subjective  No changes since last visit, no pain.    Patient is accompained by:  Family member   friend   Limitations  Walking;Standing    Patient Stated Goals  Pt wants to be able to walk again.  Wants to be able to use L hand and arm again; wants to drive, do yardwork.    Currently in Pain?  No/denies                       [facility] Adult PT Treatment/Exercise - 07/02/18 0001      Ambulation/Gait   Ambulation/Gait  Yes    Ambulation/Gait Assistance  4: Min guard;4: Min assist    Ambulation  Distance (Feet)  230 Feet   UpWalker, 25 ft no device; 115 ft, 30 ft x 2 cane   Assistive device  --   UpWalker; cane with rubber quad tip   Gait Pattern  Step-through pattern;Decreased step length - left;Decreased stance time - left;Decreased dorsiflexion - left;Poor foot clearance - left;Left flexed knee in stance;Trunk rotated posteriorly on left    Ambulation Surface  Level;Indoor    Pre-Gait Activities  Pt wears L Blue Rocker AFO and heelwedge    Gait Comments  Gait trial without device to assist with gait mechanics; tried UpWalker at request of pt due to pain in [place] with weightbearing.  UpWalker at highest setting and is still too short for patient; however, he is able to demo improved posture and activation through LLE and trunk with use of UpWalker.  Finally, trialed [facility] with rubber quad tip, with pt needing cues for sequence and cues for widened BOS with cane.  As gait progresses, he is able to progress to more consistent, correct step-through pattern with less cues.      High Level Balance  High Level Balance Activities  Backward walking    High Level Balance Comments  Forward and backward walking in parallel bars, 3 reps with cues for widened BOS in posterior direction, bilateral>RUE support only.        Neuro Re-ed    Neuro Re-ed Details   In parallel bars, using mirror, with light UE (progressing to no UE support), partial tandem stance, with cues for L glut/quad activation.  LLE as stance with RLE hip/knee flexion x 10 reps with minimal RUE support.  Cues to stand tall through L trunk and hip.               PT Short Term Goals - 06/04/18 1607      PT SHORT TERM GOAL #1   Title  Pt will be independent with HEP for improved strength, balance, gait.  TARGET 07/02/18 (may be modified due to scheduling around holidays)    Time  4    Period  Weeks    Status  New    Target Date  07/02/18      PT SHORT TERM GOAL #2   Title  Pt will perform at least 8 of 10 reps of  sit<>stand transfers independently, minimal to no UE support, for improved lower extremity strengthening and transfer efficiency.    Time  4    Period  Weeks    Status  New    Target Date  07/02/18      PT SHORT TERM GOAL #3   Title  Pt will improve Berg Balance score to at least 27/56 for decreased fall risk.    Time  4    Period  Weeks    Status  New    Target Date  07/02/18      PT SHORT TERM GOAL #4   Title  Pt will improve TUG score to less than or equal to 25 seconds for decreased fall risk.    Time  4    Period  Weeks    Status  New    Target Date  07/02/18      PT SHORT TERM GOAL #5   Title  Pt will verbalize understanding of fall prevention in home environment.    Time  4    Period  Weeks    Status  New    Target Date  07/02/18        PT Long Term Goals - 06/04/18 1610      PT LONG TERM GOAL #1   Title  Pt will verbalize plans for continued community fitness upon d/c from PT.  TARGET 07/30/18    Time  8    Period  Weeks    Status  New    Target Date  07/30/18      PT LONG TERM GOAL #2   Title  Pt will improve gait velocity to at least 1.8 ft/sec for improved gait efficiency and safety.    Time  8    Period  Weeks    Status  New    Target Date  07/30/18      PT LONG TERM GOAL #3   Title  Pt will improve Berg score to at least 37/56 for decreased fall risk.    Time  8    Period  Weeks    Status  New    Target Date  07/30/18      PT LONG TERM GOAL #4   Title  Pt will improve TUG score to  less than or equal to 15 seconds for decreased fall risk.    Time  8    Period  Weeks    Status  New    Target Date  07/30/18      PT LONG TERM GOAL #5   Title  Pt will ambulate at least 150 ft using least restrictive assistive device, modified independently, for improved independence with gait.    Time  8    Period  Weeks    Status  New    Target Date  07/30/18      Additional Long Term Goals   Additional Long Term Goals  Yes      PT LONG TERM GOAL #6    Title  Pt will negotiate at least 12 steps, one handrail, step-through pattern, modified independently, for improved stair negotiation in home.    Time  8    Period  Weeks    Status  New    Target Date  07/30/18            Plan - 07/02/18 1613    Clinical Impression Statement  Continued gait training during this PT session today, including use of UpWalker and cane with rubber quad tip.  With UpWalker, pt noted to have improved posture, decreased LUE pain and improved activation through L trunk and LLE; however, UpWalker at highest setting is too low and therefore not appropriate to conitnue to use.  Pt does well with trial of cane in RUE, needing cues for technqiue and sequence and cane placement; he has one episode of L knee near buckling; but overall trial use of blue rocker AFO and heelwedge in therapy sessions is giving good stability and allowing improved muscle activation in LLE.  Pt will continue to benefit from skilled PT to improve strength, neuro-reeducation, balance and gait for improved mobility.    Rehab Potential  Good    Clinical Impairments Affecting Rehab Potential  good family support; independnet prior to CVA    PT Frequency  2x / week    PT Duration  8 weeks   plus eval   PT Treatment/Interventions  ADLs/Self Care Home Management;Electrical Stimulation;Therapeutic exercise;Therapeutic activities;Functional mobility training;Gait training;Stair training;DME Instruction;Balance training;Neuromuscular re-education;Patient/family education;Orthotic Fit/Training;Manual techniques    PT Next Visit Plan  Update HEP for LLE and trunk strengthening at home (bridging, pelvic tilts, etc); continue use of Blue Rocker and heelwedge on LLE for gait; try cane with rubber quad tip for gait training   Check STGs week of 07/05/2018   Recommended Other Services  PT sent request for order for AFO consult to Virgina Organ, NP, 07/02/2018    Consulted and Agree with Plan of Care   Patient;Family member/caregiver    Family Member Consulted  friend       Patient will benefit from skilled therapeutic intervention in order to improve the following deficits and impairments:  Abnormal gait, Decreased balance, Decreased mobility, Difficulty walking, Decreased strength, Impaired tone, Postural dysfunction  Visit Diagnosis: Other abnormalities of gait and mobility  Muscle weakness (generalized)  Unsteadiness on feet     Problem List Patient Active Problem List   Diagnosis Date Noted  . Abnormality of gait 06/09/2018  . Neuropathic pain   . Labile blood pressure   . PAF (paroxysmal atrial fibrillation) ([place])   . Hemiparesis affecting left side as late effect of stroke ([place])   . 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 Benign essential HTN   . Dyslipidemia   . Hemorrhagic stroke ([employer])   .  ICH (intracerebral hemorrhage) ([place]) 03/12/2018  . Prostate cancer ([place]) 01/29/2017  . 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 Diabetes ([place]) 12/14/2015  . PCP NOTES >>>>>>>>>>>>>>>>>>>>>>>>>>>>>>. 08/06/2015  . Dizziness and giddiness 01/29/2015  . Umbilical hernia 73/57/8978  . Elevated PSA, less than 10 ng/ml 04/19/2012  . Annual physical exam 01/24/2011  . Hyperlipidemia 01/03/2010  . Essential hypertension 09/20/2007    Neil Brickell W. 07/02/2018, 4:21 PM  Frazier Butt., PT   [facility] [address] [place], [place], [address] Phone: [phone]   Fax:  [phone]  Name: Marcus Beasley MRN: [id number] Date of Birth: [birth year]

## 2018-07-02 NOTE — Telephone Encounter (Signed)
Marcus Beasley has been seen by OPPT and is currently using knee cage for L knee stability with gait.  Pt would benefit from AFO (ankle-foot orththosis) consult by orthotist for progression to AFO for improved gait and L knee stability.  If you agree, could you please write order via Epic for AFO consult?  Thank you, Mady Haagensen, [place], PT 07/02/18 8:02 AM Phone: [phone] Fax: [phone]

## 2018-07-05 ENCOUNTER — Encounter: Payer: Self-pay | Admitting: Physical Therapy

## 2018-07-05 ENCOUNTER — Ambulatory Visit: Payer: Medicare Other | Admitting: Physical Therapy

## 2018-07-05 ENCOUNTER — Ambulatory Visit: Payer: Medicare Other | Admitting: Occupational Therapy

## 2018-07-05 DIAGNOSIS — I69254 Hemiplegia and hemiparesis following other nontraumatic intracranial hemorrhage affecting left non-dominant side: Secondary | ICD-10-CM

## 2018-07-05 DIAGNOSIS — R208 Other disturbances of skin sensation: Secondary | ICD-10-CM | POA: Diagnosis not present

## 2018-07-05 DIAGNOSIS — R2681 Unsteadiness on feet: Secondary | ICD-10-CM | POA: Diagnosis not present

## 2018-07-05 DIAGNOSIS — R278 Other lack of coordination: Secondary | ICD-10-CM

## 2018-07-05 DIAGNOSIS — R2689 Other abnormalities of gait and mobility: Secondary | ICD-10-CM | POA: Diagnosis not present

## 2018-07-05 DIAGNOSIS — M6281 Muscle weakness (generalized): Secondary | ICD-10-CM

## 2018-07-05 NOTE — Patient Instructions (Addendum)
Flexion (Assistive)    Hold left wrist w/ Rt hand (thumb side up) raise arms above head, keeping elbows as straight as possible. Can be done sitting or lying. Repeat _10___ times. Do __2__ sessions per day.  SHOULDER: Flexion Bilateral    LAYING DOWN, Raise arms overhead at same speed. Keep elbows straight. Focus on control and pay attention to Lt arm __10_ reps per set, _2__ sets per day   Do SAFE functional low level things with Lt arm/hand while SEATED including:  Opening low cabinets and drawers Wiping table Folding clothes Picking up remote, plastic cups, etc. Setting table

## 2018-07-05 NOTE — Therapy (Signed)
[facility] [address] [place], [place], [address] Phone: [phone]   Fax:  [phone]  Physical Therapy Treatment  Patient Details  Name: Marcus Beasley MRN: [id number] Date of Birth: Aug 28, 1945 No data recorded  Encounter Date: 07/05/2018  PT End of Session - 07/05/18 1016    Visit Number  7    Number of Visits  17    Date for PT Re-Evaluation  08/31/18    Authorization Type  [employer] Medicare; will need 10th visit progress notes    PT Start Time  0847    PT Stop Time  0935    PT Time Calculation (min)  48 min    Equipment Utilized During Treatment  Gait belt    Activity Tolerance  Patient tolerated treatment well    Behavior During Therapy  WFL for tasks assessed/performed       Past Medical History:  Diagnosis Date  . BPH (benign prostatic hyperplasia)    (-) Bx 2015  . Diabetes mellitus without complication ([place])   . Elevated PSA    Prostate Bx in 06/2013 was benign  . HTN (hypertension)   . Hyperlipidemia   . Macular degeneration, age related     Past Surgical History:  Procedure Laterality Date  . ABCESS DRAINAGE     abdomen- 26 day hospitalization 1968  . COLONOSCOPY  2016  . HERNIA REPAIR  summer '11   umbilical, dr Ninfa Linden   . POLYPECTOMY    . PROSTATE BIOPSY  06-2013 , 07-2017   (-), (-)    There were no vitals filed for this visit.  Subjective Assessment - 07/05/18 0850    Subjective  No changes, not too busy this weekend.  Tried the steps this weekend; thought I could try the old way, but I got unsteady and my daughter helped me-did not fall.    Patient is accompained by:  Family member   friend   Limitations  Walking;Standing    Patient Stated Goals  Pt wants to be able to walk again.  Wants to be able to use L hand and arm again; wants to drive, do yardwork.    Currently in Pain?  No/denies                       [facility] Adult PT Treatment/Exercise - 07/05/18 0857       Transfers   Transfers  Sit to Stand;Stand to Sit    Sit to Stand  6: Modified independent (Device/Increase time);Without upper extremity assist;From chair/3-in-1;With upper extremity assist    Stand to Sit  6: Modified independent (Device/Increase time);Without upper extremity assist;With upper extremity assist;To chair/3-in-1    Number of Reps  10 reps   then additional 5 reps throughout session   Transfer Cueing  Cues for even hand use (to reach back to seat with both hands if needed)    Comments  Pt did not wear knee cage and did not use AFO/heelwedge in LLE with activities today in therapy.      Standardized Balance Assessment   Standardized Balance Assessment  Berg Balance Test;Timed Up and Go Test      Berg Balance Test   Sit to Stand  Able to stand without using hands and stabilize independently    Standing Unsupported  Able to stand 2 minutes with supervision    Sitting with Back Unsupported but Feet Supported on Floor or Stool  Able to sit safely and securely  2 minutes    Stand to Sit  Sits safely with minimal use of hands    Transfers  Able to transfer with verbal cueing and /or supervision    Standing Unsupported with Eyes Closed  Able to stand 10 seconds with supervision    Standing Ubsupported with Feet Together  Able to place feet together independently and stand for 1 minute with supervision    From Standing, Reach Forward with Outstretched Arm  Can reach forward >12 cm safely (5")    From Standing Position, Pick up Object from [place] to pick up shoe, needs supervision    From Standing Position, Turn to Look Behind Over each Shoulder  Turn sideways only but maintains balance    Turn 360 Degrees  Needs close supervision or verbal cueing   9.08   Standing Unsupported, Alternately Place Feet on Step/Stool  Needs assistance to keep from falling or unable to try    Standing Unsupported, One Foot in [place] to take small step independently and hold 30 seconds     Standing on One Leg  Unable to try or needs assist to prevent fall    Total Score  34      Timed Up and Go Test   TUG  Normal TUG    Normal TUG (seconds)  30.66   RW; 28.84 sec cane with rubber quad tip     Knee/Hip Exercises: Supine   Bridges  Strengthening;Both;1 set;10 reps    Bridges with [employer]  Strengthening;Both;1 set;10 reps   Cues to keep hips even   Single Leg Bridge  Left;Strengthening;1 set;10 reps    Other Supine Knee/Hip Exercises  Hooklying resisted clamshell, RLE and LLE 10 reps each with red theraband             PT Education - 07/05/18 1016    Education Details  Progress towards goals; updates to HEP-see instructions    Person(s) Educated  Patient;Other (comment)   friend   Methods  Explanation;Demonstration;Handout    Comprehension  Verbalized understanding;Returned demonstration;Verbal cues required       PT Short Term Goals - 07/05/18 0852      PT SHORT TERM GOAL #1   Title  Pt will be independent with HEP for improved strength, balance, gait.  TARGET 07/02/18 (may be modified due to scheduling around holidays)    Time  4    Period  Weeks    Status  New      PT SHORT TERM GOAL #2   Title  Pt will perform at least 8 of 10 reps of sit<>stand transfers independently, minimal to no UE support, for improved lower extremity strengthening and transfer efficiency.    Time  4    Period  Weeks    Status  Achieved      PT SHORT TERM GOAL #3   Title  Pt will improve Berg Balance score to at least 27/56 for decreased fall risk.    Baseline  34/56 07/05/18    Time  4    Period  Weeks    Status  Achieved      PT SHORT TERM GOAL #4   Title  Pt will improve TUG score to less than or equal to 25 seconds for decreased fall risk.    Baseline  30.66 sec, 28.84 sec with cane    Time  4    Period  Weeks    Status  Not Met  PT SHORT TERM GOAL #5   Title  Pt will verbalize understanding of fall prevention in home environment.    Time  4    Period   Weeks    Status  New        PT Long Term Goals - 06/04/18 1610      PT LONG TERM GOAL #1   Title  Pt will verbalize plans for continued community fitness upon d/c from PT.  TARGET 07/30/18    Time  8    Period  Weeks    Status  New    Target Date  07/30/18      PT LONG TERM GOAL #2   Title  Pt will improve gait velocity to at least 1.8 ft/sec for improved gait efficiency and safety.    Time  8    Period  Weeks    Status  New    Target Date  07/30/18      PT LONG TERM GOAL #3   Title  Pt will improve Berg score to at least 37/56 for decreased fall risk.    Time  8    Period  Weeks    Status  New    Target Date  07/30/18      PT LONG TERM GOAL #4   Title  Pt will improve TUG score to less than or equal to 15 seconds for decreased fall risk.    Time  8    Period  Weeks    Status  New    Target Date  07/30/18      PT LONG TERM GOAL #5   Title  Pt will ambulate at least 150 ft using least restrictive assistive device, modified independently, for improved independence with gait.    Time  8    Period  Weeks    Status  New    Target Date  07/30/18      Additional Long Term Goals   Additional Long Term Goals  Yes      PT LONG TERM GOAL #6   Title  Pt will negotiate at least 12 steps, one handrail, step-through pattern, modified independently, for improved stair negotiation in home.    Time  8    Period  Weeks    Status  New    Target Date  07/30/18            Plan - 07/05/18 1017    Clinical Impression Statement  Began assessing STGs this visit, with pt meeting STG 2 for transfers and STG 3 for Berg score (improved from 20/56 to 34/56); STG 4 not met for TUG, but pt has improved by 3-5 seconds from initial eval measure.  All standing measures taken today with no knee cage, no set-up with AFO or heelwedge; no knee buckling noted.  Also focused time on updates to HEP for hip stregnthening exercises.  Pt is progressing with PT and will continue to benefit from skilled  PT to address further strengthening, balance, and gait for improved safety and independence with mobility.    Rehab Potential  Good    Clinical Impairments Affecting Rehab Potential  good family support; independnet prior to CVA    PT Frequency  2x / week    PT Duration  8 weeks   plus eval   PT Treatment/Interventions  ADLs/Self Care Home Management;Electrical Stimulation;Therapeutic exercise;Therapeutic activities;Functional mobility training;Gait training;Stair training;DME Instruction;Balance training;Neuromuscular re-education;Patient/family education;Orthotic Fit/Training;Manual techniques    PT Next Visit Plan  Check  HEP updates; check remaining STGs; standing exercises for balance with lessening support; continue use of Blue [place]er and heelwedge on LLE for gait; try cane with rubber quad tip for gait training    Consulted and Agree with Plan of Care  Patient;Family member/caregiver    Family Member Consulted  friend       Patient will benefit from skilled therapeutic intervention in order to improve the following deficits and impairments:  Abnormal gait, Decreased balance, Decreased mobility, Difficulty walking, Decreased strength, Impaired tone, Postural dysfunction  Visit Diagnosis: Unsteadiness on feet  Muscle weakness (generalized)     Problem List Patient Active Problem List   Diagnosis Date Noted  . Abnormality of gait 06/09/2018  . Neuropathic pain   . Labile blood pressure   . PAF (paroxysmal atrial fibrillation) ([place])   . Hemiparesis affecting left side as late effect of stroke ([place])   . 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 Benign essential HTN   . Dyslipidemia   . Hemorrhagic stroke ([place])   . ICH (intracerebral hemorrhage) ([place]) 03/12/2018  . Prostate cancer ([place]) 01/29/2017  . 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 Diabetes ([place]) 12/14/2015  . PCP NOTES >>>>>>>>>>>>>>>>>>>>>>>>>>>>>>. 08/06/2015  . Dizziness and giddiness 01/29/2015  .  Umbilical hernia 75/88/3254  . Elevated PSA, less than 10 ng/ml 04/19/2012  . Annual physical exam 01/24/2011  . Hyperlipidemia 01/03/2010  . Essential hypertension 09/20/2007    MARRIOTT,AMY W. 07/05/2018, 10:22 AM Frazier Butt., PT [facility] [address] [place], [place], [address] Phone: [phone]   Fax:  [phone]  Name: Marcus Beasley MRN: [id number] Date of Birth: 1946/02/08

## 2018-07-05 NOTE — Therapy (Signed)
[facility] [address] [place], [place], [address] Phone: [phone]   Fax:  [phone]  Occupational Therapy Treatment  Patient Details  Name: Marcus Beasley MRN: [id number] Date of Birth: [birth year] Referring Provider (OT): Venancio Poisson   Encounter Date: 07/05/2018  OT End of Session - 07/05/18 0840    Visit Number  7    Number of Visits  17    Date for OT Re-Evaluation  08/03/18    Authorization Type  UHC MCR    Authorization - Visit Number  7    Authorization - Number of Visits  10    OT Start Time  0800    OT Stop Time  0845    OT Time Calculation (min)  45 min    Activity Tolerance  Patient tolerated treatment well    Behavior During Therapy  [facility] for tasks assessed/performed       Past Medical History:  Diagnosis Date  . BPH (benign prostatic hyperplasia)    (-) Bx 2015  . Diabetes mellitus without complication ([place])   . Elevated PSA    Prostate Bx in 06/2013 was benign  . HTN (hypertension)   . Hyperlipidemia   . Macular degeneration, age related     Past Surgical History:  Procedure Laterality Date  . ABCESS DRAINAGE     abdomen- 26 day hospitalization 1968  . COLONOSCOPY  2016  . HERNIA REPAIR  summer '11   umbilical, dr Ninfa Linden   . POLYPECTOMY    . PROSTATE BIOPSY  06-2013 , 07-2017   (-), (-)    There were no vitals filed for this visit.  Subjective Assessment - 07/05/18 0803    Pertinent History  Rt thalamic hemorrhage 03/12/18. PMH: HTN, HLD, DM, A-fib    Limitations  Fall risk    Patient Stated Goals  get more function in my Lt arm and hand    Currently in Pain?  No/denies                   OT Treatments/Exercises (OP) - 07/05/18 0001      ADLs   Eating  Practiced cutting food w/ use of rocker knife - pt able to do I'ly w/ A/E in clinic. Pt provided handout on rocker knife and told where/how to purchase    UB Dressing  Pt able to doff/don shirt I'ly after  initial cueing    LB Dressing  Practiced donning/doffing socks and shoes. Pt w/ mod difficulty donning socks but able to do w/ practice and extra time.       Exercises   Exercises  Shoulder;Hand      Shoulder Exercises: ROM/Strengthening   Other ROM/Strengthening Exercises  Pt issued shoulder HEP for LUE and reviewed - see pt instructions for details      Hand Exercises   Other Hand Exercises  Pt placing large pegs in pegboard Lt hand w/ mod difficulty and drops (5 rows)              OT Education - 07/05/18 0809    Education Details  Shoulder HEP, A/E recommendation (rocker knife)    Person(s) Educated  Patient    Methods  Explanation;Demonstration;Handout    Comprehension  Verbalized understanding       OT Short Term Goals - 07/05/18 0811      OT SHORT TERM GOAL #1   Title  Pt independent with LUE HEP for neuro re-education - 07/03/18  Time  4    Period  Weeks    Status  Achieved      OT SHORT TERM GOAL #2   Title  Pt independent with coordination and putty HEP Lt hand    Time  4    Period  Weeks    Status  Achieved      OT SHORT TERM GOAL #3   Title  Pt to verbalize understanding with safety considerations LUE d/t lack of sensation    Time  4    Period  Weeks    Status  Achieved      OT SHORT TERM GOAL #4   Title  Pt to cut food mod I level with A/E prn    Time  4    Period  Weeks    Status  Achieved   IN CLINIC     OT SHORT TERM GOAL #5   Title  Pt to consistently don/doff shirt I'ly and don/doff socks I'ly w/ task modifications prn    Time  4    Period  Weeks    Status  Achieved   IN CLINIC     OT SHORT TERM GOAL #6   Title  Pt to verbalize understanding and consistently demo correct reaching pattern LUE to prevent pain Lt shoulder    Time  4    Period  Weeks    Status  On-going      OT SHORT TERM GOAL #7   Title  Pt to improve coordination Lt hand as evidenced by performing 9 hole peg test in under 2 min.     Baseline  eval - placed 5 pegs  in 2 min.    Time  4    Period  Weeks    Status  On-going        OT Long Term Goals - 06/02/18 1144      OT LONG TERM GOAL #1   Title  Pt to perform functional mid to high level reaching LUE w/ only min compensations and no pain - 08/03/18    Time  8    Period  Weeks    Status  New      OT LONG TERM GOAL #2   Title  Pt to improve Lt hand coordination as evidenced by performing 9 hole peg test in 90 sec. or under    Time  8    Period  Weeks    Status  New      OT LONG TERM GOAL #3   Title  Pt to improve grip strength Lt hand to 40 lbs or greater to assist w/ opening jars/containers    Baseline  eval: 20 lbs    Time  8    Period  Weeks    Status  New      OT LONG TERM GOAL #4   Title  Pt to demo full composite flexion Lt hand for coordination/grasping tasks    Baseline  eval: 75%    Time  8    Period  Weeks    Status  New      OT LONG TERM GOAL #5   Title  Pt to be able to tie shoe laces and hook/unhook buttons using Lt hand as assist    Time  8    Period  Weeks    Status  New      Long Term Additional Goals   Additional Long Term Goals  Yes  OT LONG TERM GOAL #6   Title  Pt to return to simple cooking and cleaning tasks from standing level w/o LOB using countertop support prn    Time  8    Period  Weeks    Status  New      OT LONG TERM GOAL #7   Title  Pt to return to financial management tasks w/ supervision prn    Time  8    Period  Weeks    Status  New            Plan - 07/05/18 0841    Clinical Impression Statement  Pt met 5/7 STG's at this time. Pt continues to make progress in ADLS and LUE function    Occupational Profile and client history currently impacting functional performance  PMH; HTN, HLD, DM, A-fib. Current deficits impeding pt's ability to perform ADLS, IADLS, and role as husband, friend, and leisure participation    Occupational performance deficits (Please refer to evaluation for details):  ADL's;IADL's;Leisure    Rehab  Potential  Good    OT Frequency  2x / week    OT Duration  8 weeks    OT Treatment/Interventions  Self-care/ADL training;Moist Heat;DME and/or AE instruction;Splinting;Therapeutic activities;Psychosocial skills training;Aquatic Therapy;Therapeutic exercise;Cognitive remediation/compensation;Coping strategies training;Neuromuscular education;Functional Mobility Training;Passive range of motion;Visual/perceptual remediation/compensation;Manual Therapy;Patient/family education;Electrical Stimulation    Plan  continue NMR and functional use LUE, try low to mid reaching in standing, assess STG #7    Consulted and Agree with Plan of Care  Patient       Patient will benefit from skilled therapeutic intervention in order to improve the following deficits and impairments:  Decreased coordination, Decreased range of motion, Difficulty walking, Improper body mechanics, Decreased safety awareness, Impaired sensation, Impaired tone, Decreased activity tolerance, Decreased knowledge of precautions, Impaired UE functional use, Pain, Decreased knowledge of use of DME, Decreased balance, Decreased cognition, Decreased mobility, Decreased strength  Visit Diagnosis: 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 Abnormality of gait 06/09/2018  . Neuropathic pain   . Labile blood pressure   . PAF (paroxysmal atrial fibrillation) ([place])   . Hemiparesis affecting left side as late effect of stroke ([place])   . Thalamic hemorrhage ([place]) 03/17/2018  . Benign essential HTN   . Dyslipidemia   . Hemorrhagic stroke ([place])   . ICH (intracerebral hemorrhage) ([place]) 03/12/2018  . Prostate cancer ([place]) 01/29/2017  . 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 Diabetes  ([place]) 12/14/2015  . PCP NOTES >>>>>>>>>>>>>>>>>>>>>>>>>>>>>>. 08/06/2015  . Dizziness and giddiness 01/29/2015  . Umbilical hernia 63/14/9702  . Elevated PSA, less than 10 ng/ml 04/19/2012  . Annual physical exam 01/24/2011  . Hyperlipidemia 01/03/2010  . Essential hypertension 09/20/2007    Carey Bullocks, OTR/L 07/05/2018, 8:44 AM  [facility] [address] [place], [place], [address] Phone: [phone]   Fax:  [phone]  Name: RIELLY CORLETT MRN: [id number] Date of Birth: [birth year]

## 2018-07-05 NOTE — Patient Instructions (Signed)
Access Code: [email or web address]  URL: [email or web address]  Date: 07/05/2018  Prepared by: Mady Haagensen   Exercises  Single Leg Bridge - 10 reps - 2 sets - 1x daily - 5x weekly  Supine Bridge with Mini Swiss Ball Between Knees - 10 reps - 2 sets - 3 sec hold - 1x daily - 5x weekly  Hooklying Isometric Clamshell - 10 reps - 2 sets - 1x daily - 5x weekly

## 2018-07-07 ENCOUNTER — Encounter: Payer: Medicare Other | Attending: Physical Medicine & Rehabilitation | Admitting: Physical Medicine & Rehabilitation

## 2018-07-07 ENCOUNTER — Encounter: Payer: Self-pay | Admitting: Physical Medicine & Rehabilitation

## 2018-07-07 VITALS — BP 148/83 | HR 67 | Ht 71.0 in | Wt 169.0 lb

## 2018-07-07 DIAGNOSIS — I10 Essential (primary) hypertension: Secondary | ICD-10-CM | POA: Insufficient documentation

## 2018-07-07 DIAGNOSIS — M792 Neuralgia and neuritis, unspecified: Secondary | ICD-10-CM | POA: Diagnosis not present

## 2018-07-07 DIAGNOSIS — G8110 Spastic hemiplegia affecting unspecified side: Secondary | ICD-10-CM

## 2018-07-07 DIAGNOSIS — E119 Type 2 diabetes mellitus without complications: Secondary | ICD-10-CM | POA: Insufficient documentation

## 2018-07-07 DIAGNOSIS — I69354 Hemiplegia and hemiparesis following cerebral infarction affecting left non-dominant side: Secondary | ICD-10-CM

## 2018-07-07 DIAGNOSIS — I610 Nontraumatic intracerebral hemorrhage in hemisphere, subcortical: Secondary | ICD-10-CM | POA: Diagnosis not present

## 2018-07-07 DIAGNOSIS — R269 Unspecified abnormalities of gait and mobility: Secondary | ICD-10-CM

## 2018-07-07 MED ORDER — GABAPENTIN 600 MG PO TABS: 900.0000 mg | ORAL_TABLET | Freq: Three times a day (TID) | ORAL | 1 refills | Status: DC

## 2018-07-07 MED ORDER — BACLOFEN 20 MG PO TABS: 20.0000 mg | ORAL_TABLET | Freq: Three times a day (TID) | ORAL | 1 refills | Status: DC

## 2018-07-07 NOTE — Progress Notes (Signed)
Subjective:    Patient ID: Marcus Beasley, male    DOB: 05/31/1946, 73 y.o.   MRN: [id number]  HPI 73 year old right-handed male with history of diabetes mellitus, hypertension presents for follow up for right thalamic hemorrhage.   Last clinic visit 06/09/18.  Since that time, patient states he is getting stronger leg >arm.  He is still in therapies. He is following up with Neurology next month.  He did not notice much difference with increase in Gabapentin or Baclofen.  Denies falls.  Pain Inventory Average Pain 6 Pain Right Now 6 My pain is burning and numbness  In the last 24 hours, has pain interfered with the following? General activity 4 Relation with others 4 Enjoyment of life 4 What TIME of day is your pain at its worst? night Sleep (in general) Fair  Pain is worse with: some activites Pain improves with: nothing Relief from Meds: 0  Mobility walk with assistance use a walker use a wheelchair Do you have any goals in this area?  yes  Function retired  Neuro/Psych numbness tingling trouble walking  Prior Studies Any changes since last visit?  no  Physicians involved in your care Any changes since last visit?  no   Family History  Problem Relation Age of Onset  . Leukemia Mother   . Heart attack Father        MI age 39  . Heart disease Sister        age 85  . Cancer - Other Sister        type  . Heart disease Brother        age 81  . Kidney disease Brother        HD  . Diabetes Maternal Grandmother   . Prostate cancer Neg Hx   . Colon cancer Neg Hx   . Esophageal cancer Neg Hx   . Rectal cancer Neg Hx   . Stomach cancer Neg Hx    Social History   Socioeconomic History  . Marital status: Married    Spouse name: Not on file  . Number of children: 3  . Years of education: 51  . Highest education level: Not on file  Occupational History  . 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  Financial resource strain: Not on file  . Food insecurity:    Worry: Not on file    Inability: Not on file  . Transportation needs:    Medical: Not on file    Non-medical: Not on file  Tobacco Use  . Smoking status: Never Smoker  . Smokeless tobacco: Never Used  Substance and Sexual Activity  . Alcohol use: Yes    Comment: occ. beer  . Drug use: No  . Sexual activity: Yes    Partners: Female  Lifestyle  . Physical activity:    Days per week: Not on file    Minutes per session: Not on file  . Stress: Not on file  Relationships  . Social connections:    Talks on phone: Not on file    Gets together: Not on file    Attends religious service: Not on file    Active member of club or organization: Not on file    Attends meetings of clubs or organizations: Not on file    Relationship status: Not on file  Other Topics Concern  . 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 2 dtrs , 1 son - homicide. 5 grandchildren.     Past Surgical History:  Procedure Laterality Date  . ABCESS DRAINAGE     abdomen- 26 day hospitalization 1968  . COLONOSCOPY  2016  . HERNIA REPAIR  summer '11   umbilical, dr Ninfa Linden   . POLYPECTOMY    . PROSTATE BIOPSY  06-2013 , 07-2017   (-), (-)   Past Medical History:  Diagnosis Date  . BPH (benign prostatic hyperplasia)    (-) Bx 2015  . Diabetes mellitus without complication ([place])   . Elevated PSA    Prostate Bx in 06/2013 was benign  . HTN (hypertension)   . Hyperlipidemia   . Macular degeneration, age related    BP (!) 148/83   Pulse 67   Ht 5\' 11"  (1.803 m) Comment: previous  Wt 169 lb (76.7 kg) Comment: previous  SpO2 96%   BMI 23.57 kg/m   Opioid Risk Score:   Fall Risk Score:  `1  Depression screen PHQ 2/9  Depression screen [facility] 2/9 04/28/2018 08/25/2017 04/14/2016 12/14/2015 08/06/2015 07/31/2014  Decreased Interest 0 0 0 0 0 0  Down, Depressed, Hopeless 0 0 0 0 0 0  PHQ - 2 Score 0 0 0 0 0 0      Review of Systems  Constitutional: Positive for unexpected weight change.  HENT: Negative.   Eyes: Negative.   Respiratory: Negative.   Cardiovascular: Negative.   Gastrointestinal: Negative.   Endocrine: Negative.   Genitourinary: Negative.   Musculoskeletal: Positive for arthralgias and gait problem.  Skin: Negative.   Allergic/Immunologic: Negative.   Neurological: Positive for weakness and numbness.  Hematological: Negative.   Psychiatric/Behavioral: Negative.   All other systems reviewed and are negative.      Objective:   Physical Exam Constitutional: No distress . Vital signs reviewed. HENT: Normocephalic.  Atraumatic. Eyes: EOMI. No discharge. Cardiovascular:  RRR. No JVD. Respiratory: CTA  bilaterally. Normal effort. GI: BS +. Non-distended. Musc: left hand edema  Neurological: He is alert and oriented Follows basic commands  Motor:  LUE: Shoulder abduction 4/5, elbow flex 4+/5, elbow extension 4/5, hand grip 3+/5 with apraxia LLE: HF, KE, ADF 4+/5 Mas: left elbow flexors: 1+/4 Skin: Skin is warm and dry.  Psychiatric: He has a normal mood and affect. His behavior is normal. Thought content normal.     Assessment & Plan:  73 year old right-handed male with history of diabetes mellitus, hypertension presents for follow up for right thalamic hemorrhage.   1. Left-sided weakness secondary to right thalamic hemorrhage secondary to hypertensive crisis now with spasticity  Cont therapies   Cont follow up with Neurology   2. Pain Management:   Tylenol as needed  Will increase Gabapentin to 900 3 times daily   Will increase Baclofen 20 TID   D/ced Tramadol  Encouraged ROM again  3. Gait abnormality  Cont therapies  Cont walker/wheelchair for safety

## 2018-07-08 ENCOUNTER — Ambulatory Visit: Payer: Medicare Other | Admitting: Occupational Therapy

## 2018-07-08 ENCOUNTER — Encounter: Payer: Self-pay | Admitting: Physical Therapy

## 2018-07-08 ENCOUNTER — Ambulatory Visit: Payer: Medicare Other | Admitting: Physical Therapy

## 2018-07-08 DIAGNOSIS — R278 Other lack of coordination: Secondary | ICD-10-CM

## 2018-07-08 DIAGNOSIS — R208 Other disturbances of skin sensation: Secondary | ICD-10-CM | POA: Diagnosis not present

## 2018-07-08 DIAGNOSIS — R2681 Unsteadiness on feet: Secondary | ICD-10-CM

## 2018-07-08 DIAGNOSIS — R2689 Other abnormalities of gait and mobility: Secondary | ICD-10-CM | POA: Diagnosis not present

## 2018-07-08 DIAGNOSIS — M6281 Muscle weakness (generalized): Secondary | ICD-10-CM | POA: Diagnosis not present

## 2018-07-08 DIAGNOSIS — I69254 Hemiplegia and hemiparesis following other nontraumatic intracranial hemorrhage affecting left non-dominant side: Secondary | ICD-10-CM | POA: Diagnosis not present

## 2018-07-08 NOTE — Therapy (Signed)
[facility] [address] [place], [place], [address] Phone: [phone]   Fax:  [phone]  Occupational Therapy Treatment  Patient Details  Name: Marcus Beasley MRN: [id number] Date of Birth: 1945-11-28 Referring Provider (OT): Venancio Poisson   Encounter Date: 07/08/2018  OT End of Session - 07/08/18 0946    Visit Number  8    Number of Visits  17    Date for OT Re-Evaluation  08/03/18    Authorization Type  UHC MCR    Authorization - Visit Number  8    Authorization - Number of Visits  10    OT Start Time  0845    OT Stop Time  0930    OT Time Calculation (min)  45 min    Activity Tolerance  Patient tolerated treatment well    Behavior During Therapy  [facility] for tasks assessed/performed       Past Medical History:  Diagnosis Date  . BPH (benign prostatic hyperplasia)    (-) Bx 2015  . Diabetes mellitus without complication ([place])   . Elevated PSA    Prostate Bx in 06/2013 was benign  . HTN (hypertension)   . Hyperlipidemia   . Macular degeneration, age related     Past Surgical History:  Procedure Laterality Date  . ABCESS DRAINAGE     abdomen- 26 day hospitalization 1968  . COLONOSCOPY  2016  . HERNIA REPAIR  summer '11   umbilical, dr Ninfa Linden   . POLYPECTOMY    . PROSTATE BIOPSY  06-2013 , 07-2017   (-), (-)    There were no vitals filed for this visit.  Subjective Assessment - 07/08/18 0853    Pertinent History  Rt thalamic hemorrhage 03/12/18. PMH: HTN, HLD, DM, A-fib    Limitations  Fall risk    Patient Stated Goals  get more function in my Lt arm and hand    Currently in Pain?  No/denies       Re-assessed 9 hole peg test however pt made no improvements with this.  Standing to place medium sized pegs in pegboard (tabletop surface) working on coordination Lt hand, and small wt shifts LE's for standing balance w/ close supervision. Pt required extra time and cueing for standing posture,  proper reaching pattern, and attention to Lt side.  Seated: wt bearing over LUE extended arm w/ trunk rotation and cross reaching RUE, then over elbow w/ scapula depression and sh girdle activation. Pt reports neuropathic pain in fingertips w/ wt bearing ex's. Recommended pt discuss w/ MD. Pt reports MD just increased his dose of gabapentin. Seated: LUE AA/ROM in sh flexion using UE Ranger on diagonal pattern w/ cues for proper positioning/reach pattern and technique.                        OT Short Term Goals - 07/08/18 0947      OT SHORT TERM GOAL #1   Title  Pt independent with LUE HEP for neuro re-education - 07/03/18    Time  4    Period  Weeks    Status  Achieved      OT SHORT TERM GOAL #2   Title  Pt independent with coordination and putty HEP Lt hand    Time  4    Period  Weeks    Status  Achieved      OT SHORT TERM GOAL #3   Title  Pt to verbalize understanding  with safety considerations LUE d/t lack of sensation    Time  4    Period  Weeks    Status  Achieved      OT SHORT TERM GOAL #4   Title  Pt to cut food mod I level with A/E prn    Time  4    Period  Weeks    Status  Achieved   IN CLINIC     OT SHORT TERM GOAL #5   Title  Pt to consistently don/doff shirt I'ly and don/doff socks I'ly w/ task modifications prn    Time  4    Period  Weeks    Status  Achieved   IN CLINIC     OT SHORT TERM GOAL #6   Title  Pt to verbalize understanding and consistently demo correct reaching pattern LUE to prevent pain Lt shoulder    Time  4    Period  Weeks    Status  On-going      OT SHORT TERM GOAL #7   Title  Pt to improve coordination Lt hand as evidenced by performing 9 hole peg test in under 2 min.     Baseline  eval - placed 5 pegs in 2 min.    Time  4    Period  Weeks    Status  Not Met        OT Long Term Goals - 06/02/18 1144      OT LONG TERM GOAL #1   Title  Pt to perform functional mid to high level reaching LUE w/ only min  compensations and no pain - 08/03/18    Time  8    Period  Weeks    Status  New      OT LONG TERM GOAL #2   Title  Pt to improve Lt hand coordination as evidenced by performing 9 hole peg test in 90 sec. or under    Time  8    Period  Weeks    Status  New      OT LONG TERM GOAL #3   Title  Pt to improve grip strength Lt hand to 40 lbs or greater to assist w/ opening jars/containers    Baseline  eval: 20 lbs    Time  8    Period  Weeks    Status  New      OT LONG TERM GOAL #4   Title  Pt to demo full composite flexion Lt hand for coordination/grasping tasks    Baseline  eval: 75%    Time  8    Period  Weeks    Status  New      OT LONG TERM GOAL #5   Title  Pt to be able to tie shoe laces and hook/unhook buttons using Lt hand as assist    Time  8    Period  Weeks    Status  New      Long Term Additional Goals   Additional Long Term Goals  Yes      OT LONG TERM GOAL #6   Title  Pt to return to simple cooking and cleaning tasks from standing level w/o LOB using countertop support prn    Time  8    Period  Weeks    Status  New      OT LONG TERM GOAL #7   Title  Pt to return to financial management tasks w/ supervision prn    Time  [place] - 07/08/18 0947    Clinical Impression Statement  Pt progressing with LUE function and dynamic standing balance w/ support    Occupational Profile and client history currently impacting functional performance  PMH; HTN, HLD, DM, A-fib. Current deficits impeding pt's ability to perform ADLS, IADLS, and role as husband, friend, and leisure participation    Occupational performance deficits (Please refer to evaluation for details):  ADL's;IADL's;Leisure    Rehab Potential  Good    OT Frequency  2x / week    OT Duration  8 weeks    OT Treatment/Interventions  Self-care/ADL training;Moist Heat;DME and/or AE instruction;Splinting;Therapeutic activities;Psychosocial skills training;Aquatic  Therapy;Therapeutic exercise;Cognitive remediation/compensation;Coping strategies training;Neuromuscular education;Functional Mobility Training;Passive range of motion;Visual/perceptual remediation/compensation;Manual Therapy;Patient/family education;Electrical Stimulation    Plan  continue NMR and functional use LUE, weight bearing as tolerated    Consulted and Agree with Plan of Care  Patient       Patient will benefit from skilled therapeutic intervention in order to improve the following deficits and impairments:  Decreased coordination, Decreased range of motion, Difficulty walking, Improper body mechanics, Decreased safety awareness, Impaired sensation, Impaired tone, Decreased activity tolerance, Decreased knowledge of precautions, Impaired UE functional use, Pain, Decreased knowledge of use of DME, Decreased balance, Decreased cognition, Decreased mobility, Decreased strength  Visit Diagnosis: 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 Spastic hemiplegia affecting nondominant side ([place]) 07/07/2018  . Abnormality of gait 06/09/2018  . Neuropathic pain   . Labile blood pressure   . PAF (paroxysmal atrial fibrillation) ([place])   . Hemiparesis affecting left side as late effect of stroke ([place])   . Thalamic hemorrhage ([place]) 03/17/2018  . Benign essential HTN   . Dyslipidemia   . Hemorrhagic stroke ([place])   . ICH (intracerebral hemorrhage) ([place]) 03/12/2018  . Prostate cancer ([place]) 01/29/2017  . 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 Diabetes ([place]) 12/14/2015  . PCP NOTES >>>>>>>>>>>>>>>>>>>>>>>>>>>>>>. 08/06/2015  . Dizziness and giddiness 01/29/2015  . Umbilical hernia 16/57/9038  . Elevated PSA, less than 10 ng/ml 04/19/2012  .  Annual physical exam 01/24/2011  . Hyperlipidemia 01/03/2010  . Essential hypertension 09/20/2007    Carey Bullocks, OTR/L 07/08/2018, 9:50 AM  [facility] [address] [place], [place], [address] Phone: [phone]   Fax:  [phone]  Name: Marcus Beasley MRN: [id number] Date of Birth: 12-Oct-1945

## 2018-07-08 NOTE — Therapy (Signed)
[facility] [address] [place], [place], [address] Phone: [phone]   Fax:  [phone]  Physical Therapy Treatment  Patient Details  Name: Marcus Beasley MRN: [id number] Date of Birth: [birth year] No data recorded  Encounter Date: 07/08/2018  PT End of Session - 07/08/18 1541    Visit Number  8    Number of Visits  17    Date for PT Re-Evaluation  08/31/18    Authorization Type  [employer] Medicare; will need 10th visit progress notes    PT Start Time  0933    PT Stop Time  1017    PT Time Calculation (min)  44 min    Equipment Utilized During Treatment  Gait belt    Activity Tolerance  Patient tolerated treatment well    Behavior During Therapy  WFL for tasks assessed/performed       Past Medical History:  Diagnosis Date  . BPH (benign prostatic hyperplasia)    (-) Bx 2015  . Diabetes mellitus without complication ([place])   . Elevated PSA    Prostate Bx in 06/2013 was benign  . HTN (hypertension)   . Hyperlipidemia   . Macular degeneration, age related     Past Surgical History:  Procedure Laterality Date  . ABCESS DRAINAGE     abdomen- 26 day hospitalization 1968  . COLONOSCOPY  2016  . HERNIA REPAIR  summer '11   umbilical, dr Ninfa Linden   . POLYPECTOMY    . PROSTATE BIOPSY  06-2013 , 07-2017   (-), (-)    There were no vitals filed for this visit.  Subjective Assessment - 07/08/18 0937    Subjective  No changes, except increased the Gabapentin.    Patient is accompained by:  Family member   friend   Limitations  Walking;Standing    Patient Stated Goals  Pt wants to be able to walk again.  Wants to be able to use L hand and arm again; wants to drive, do yardwork.    Currently in Pain?  No/denies                       [facility] Adult PT Treatment/Exercise - 07/08/18 0001      Ambulation/Gait   Ambulation/Gait  Yes    Ambulation/Gait Assistance  4: Min assist    Ambulation/Gait  Assistance Details  Gait with cane with rubber quad tip with no AFO, then gait with blue rocker AFO (no heel wedge).  Pt has one episode with L knee buckling with gait with blue Rocker.  PT provides tactile and verbal cues for upright posture through LLE, hip/knee extension on LLE.    Ambulation Distance (Feet)  230 Feet   no AFO, then 115 ft, 30 ft Blue Rocker   Assistive device  Straight cane   with rubber quad tip   Gait Pattern  Step-through pattern;Decreased step length - left;Decreased stance time - left;Decreased dorsiflexion - left;Poor foot clearance - left;Left flexed knee in stance;Trunk rotated posteriorly on left    Ambulation Surface  Level;Indoor    Stairs  Yes    Stairs Assistance  4: Min assist    Stair Management Technique  One rail Left;Step to pattern;Forwards    Number of Stairs  4    Height of Stairs  6    Pre-Gait Activities  LLE as stance, RLE step taps to 6" step with UE support-cues for L hip/knee extension in stance.  LLE forward  step ups 3 reps for quad strengthening .    Gait Comments  AFO consult through [facility] is scheduled for 07/12/2018.      Self-Care   Self-Care  Other Self-Care Comments    Other Self-Care Comments   Provided fall prevention education to patient; discussed need to continue using RW for safety with mobility at home (with training at times with cane in therapy)      Knee/Hip Exercises: Supine   Bridges with Diona Foley Squeeze  Strengthening;Both;1 set;10 reps    Single Leg Bridge  Left;Strengthening;1 set;10 reps    Other Supine Knee/Hip Exercises  Hooklying resisted clamshell, RLE and LLE 10 reps each with green then blue theraband    Other Supine Knee/Hip Exercises  Reviewed exercises given last visit-pt return demo understanding               PT Short Term Goals - 07/08/18 0950      PT SHORT TERM GOAL #1   Title  Pt will be independent with HEP for improved strength, balance, gait.  TARGET 07/02/18 (may be modified due to scheduling  around holidays)    Time  4    Period  Weeks    Status  Achieved      PT SHORT TERM GOAL #2   Title  Pt will perform at least 8 of 10 reps of sit<>stand transfers independently, minimal to no UE support, for improved lower extremity strengthening and transfer efficiency.    Time  4    Period  Weeks    Status  Achieved      PT SHORT TERM GOAL #3   Title  Pt will improve Berg Balance score to at least 27/56 for decreased fall risk.    Baseline  34/56 07/05/18    Time  4    Period  Weeks    Status  Achieved      PT SHORT TERM GOAL #4   Title  Pt will improve TUG score to less than or equal to 25 seconds for decreased fall risk.    Baseline  30.66 sec, 28.84 sec with cane    Time  4    Period  Weeks    Status  Not Met      PT SHORT TERM GOAL #5   Title  Pt will verbalize understanding of fall prevention in home environment.    Time  4    Period  Weeks    Status  Achieved        PT Long Term Goals - 06/04/18 1610      PT LONG TERM GOAL #1   Title  Pt will verbalize plans for continued community fitness upon d/c from PT.  TARGET 07/30/18    Time  8    Period  Weeks    Status  New    Target Date  07/30/18      PT LONG TERM GOAL #2   Title  Pt will improve gait velocity to at least 1.8 ft/sec for improved gait efficiency and safety.    Time  8    Period  Weeks    Status  New    Target Date  07/30/18      PT LONG TERM GOAL #3   Title  Pt will improve Berg score to at least 37/56 for decreased fall risk.    Time  8    Period  Weeks    Status  New    Target Date  07/30/18  PT LONG TERM GOAL #4   Title  Pt will improve TUG score to less than or equal to 15 seconds for decreased fall risk.    Time  8    Period  Weeks    Status  New    Target Date  07/30/18      PT LONG TERM GOAL #5   Title  Pt will ambulate at least 150 ft using least restrictive assistive device, modified independently, for improved independence with gait.    Time  8    Period  Weeks    Status   New    Target Date  07/30/18      Additional Long Term Goals   Additional Long Term Goals  Yes      PT LONG TERM GOAL #6   Title  Pt will negotiate at least 12 steps, one handrail, step-through pattern, modified independently, for improved stair negotiation in home.    Time  8    Period  Weeks    Status  New    Target Date  07/30/18            Plan - 07/08/18 1542    Clinical Impression Statement  STG 1 and STG 5 met for independence with HEP and fall prevention.  Gait training with cane today and no AFO; however, pt does not seem as stable/sure of LLE in stance phase, with one episode of L knee buckling.  Pt will benefit from AFO consult, which will be on Monday.  Pt continues to beneift from skilled PT to address LLE strengthening, neuro-reeducation and gait training to progress to independence with mobility.    Rehab Potential  Good    Clinical Impairments Affecting Rehab Potential  good family support; independnet prior to CVA    PT Frequency  2x / week    PT Duration  8 weeks   plus eval   PT Treatment/Interventions  ADLs/Self Care Home Management;Electrical Stimulation;Therapeutic exercise;Therapeutic activities;Functional mobility training;Gait training;Stair training;DME Instruction;Balance training;Neuromuscular re-education;Patient/family education;Orthotic Fit/Training;Manual techniques    PT Next Visit Plan  standing exercises for balance with lessening support; AFO consult 07/12/2018 PT session; LLE strengthening and balance    Consulted and Agree with Plan of Care  Patient;Family member/caregiver    Family Member Consulted  friend       Patient will benefit from skilled therapeutic intervention in order to improve the following deficits and impairments:  Abnormal gait, Decreased balance, Decreased mobility, Difficulty walking, Decreased strength, Impaired tone, Postural dysfunction  Visit Diagnosis: Unsteadiness on feet  Other abnormalities of gait and  mobility     Problem List Patient Active Problem List   Diagnosis Date Noted  . Spastic hemiplegia affecting nondominant side ([place]) 07/07/2018  . Abnormality of gait 06/09/2018  . Neuropathic pain   . Labile blood pressure   . PAF (paroxysmal atrial fibrillation) ([place])   . Hemiparesis affecting left side as late effect of stroke ([place])   . 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 Benign essential HTN   . Dyslipidemia   . Hemorrhagic stroke ([place])   . ICH (intracerebral hemorrhage) ([place]) 03/12/2018  . Prostate cancer ([place]) 01/29/2017  . 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 Diabetes ([place]) 12/14/2015  . PCP NOTES >>>>>>>>>>>>>>>>>>>>>>>>>>>>>>. 08/06/2015  . Dizziness and giddiness 01/29/2015  . Umbilical hernia 81/44/8185  . Elevated PSA, less than 10 ng/ml 04/19/2012  . Annual physical exam 01/24/2011  . Hyperlipidemia 01/03/2010  . Essential hypertension 09/20/2007  Consetta Cosner W. 07/08/2018, 3:50 PM  Frazier Butt., PT  [facility] [address] [place], [place], [address] Phone: [phone]   Fax:  [phone]  Name: Marcus Beasley MRN: [id number] Date of Birth: 02-19-1946

## 2018-07-12 ENCOUNTER — Ambulatory Visit: Payer: Medicare Other | Admitting: Occupational Therapy

## 2018-07-12 ENCOUNTER — Ambulatory Visit: Payer: Medicare Other | Admitting: Physical Therapy

## 2018-07-12 ENCOUNTER — Encounter: Payer: Self-pay | Admitting: Physical Therapy

## 2018-07-12 DIAGNOSIS — R278 Other lack of coordination: Secondary | ICD-10-CM | POA: Diagnosis not present

## 2018-07-12 DIAGNOSIS — M6281 Muscle weakness (generalized): Secondary | ICD-10-CM | POA: Diagnosis not present

## 2018-07-12 DIAGNOSIS — R208 Other disturbances of skin sensation: Secondary | ICD-10-CM

## 2018-07-12 DIAGNOSIS — I69254 Hemiplegia and hemiparesis following other nontraumatic intracranial hemorrhage affecting left non-dominant side: Secondary | ICD-10-CM | POA: Diagnosis not present

## 2018-07-12 DIAGNOSIS — R2681 Unsteadiness on feet: Secondary | ICD-10-CM | POA: Diagnosis not present

## 2018-07-12 DIAGNOSIS — R2689 Other abnormalities of gait and mobility: Secondary | ICD-10-CM | POA: Diagnosis not present

## 2018-07-12 NOTE — Therapy (Signed)
[facility] [address] [place], [place], [address] Phone: [phone]   Fax:  [phone]  Occupational Therapy Treatment  Patient Details  Name: Marcus Beasley MRN: [id number] Date of Birth: 07-11-1945 Referring Provider (OT): Venancio Poisson   Encounter Date: 07/12/2018  OT End of Session - 07/12/18 0921    Visit Number  9    Number of Visits  17    Date for OT Re-Evaluation  08/03/18    Authorization Type  [employer]HC MCR    Authorization - Visit Number  9    Authorization - Number of Visits  10    OT Start Time  0845    OT Stop Time  0930    OT Time Calculation (min)  45 min    Activity Tolerance  Patient tolerated treatment well    Behavior During Therapy  [facility] for tasks assessed/performed       Past Medical History:  Diagnosis Date  . BPH (benign prostatic hyperplasia)    (-) Bx 2015  . Diabetes mellitus without complication ([place])   . Elevated PSA    Prostate Bx in 06/2013 was benign  . HTN (hypertension)   . Hyperlipidemia   . Macular degeneration, age related     Past Surgical History:  Procedure Laterality Date  . ABCESS DRAINAGE     abdomen- 26 day hospitalization 1968  . COLONOSCOPY  2016  . HERNIA REPAIR  summer '11   umbilical, dr Ninfa Linden   . POLYPECTOMY    . PROSTATE BIOPSY  06-2013 , 07-2017   (-), (-)    There were no vitals filed for this visit.  Subjective Assessment - 07/12/18 0851    Pertinent History  Rt thalamic hemorrhage 03/12/18. PMH: HTN, HLD, DM, A-fib    Limitations  Fall risk    Patient Stated Goals  get more function in my Lt arm and hand    Currently in Pain?  Yes    Pain Location  Hand    Pain Orientation  Left    Pain Descriptors / Indicators  Burning    Pain Onset  More than a month ago    Pain Frequency  Intermittent    Aggravating Factors   wt bearing, passive finger flexion    Pain Relieving Factors  rest       Passive stretching to Lt hand for PIP flexion  and extension. Pt very stiff and recommended soaking hand in warm water while stretching, then elevating after soaking.  Wt bearing over LUE straight arm then over elbow w/ body on arm movements. LUE sh flexion in AA/ROM using UE Ranger with min cues/facilitation needed.  BUE low range shoulder flexion holding ball while extending out in front and to each side.  Low range open chain functional reaching LUE to place checkers into Connect 4 slots on lower surface w/ min to mod cues to prevent IR and abduction at shoulder.                       OT Short Term Goals - 07/08/18 0947      OT SHORT TERM GOAL #1   Title  Pt independent with LUE HEP for neuro re-education - 07/03/18    Time  4    Period  Weeks    Status  Achieved      OT SHORT TERM GOAL #2   Title  Pt independent with coordination and putty HEP Lt hand  Time  4    Period  Weeks    Status  Achieved      OT SHORT TERM GOAL #3   Title  Pt to verbalize understanding with safety considerations LUE d/t lack of sensation    Time  4    Period  Weeks    Status  Achieved      OT SHORT TERM GOAL #4   Title  Pt to cut food mod I level with A/E prn    Time  4    Period  Weeks    Status  Achieved   IN CLINIC     OT SHORT TERM GOAL #5   Title  Pt to consistently don/doff shirt I'ly and don/doff socks I'ly w/ task modifications prn    Time  4    Period  Weeks    Status  Achieved   IN CLINIC     OT SHORT TERM GOAL #6   Title  Pt to verbalize understanding and consistently demo correct reaching pattern LUE to prevent pain Lt shoulder    Time  4    Period  Weeks    Status  On-going      OT SHORT TERM GOAL #7   Title  Pt to improve coordination Lt hand as evidenced by performing 9 hole peg test in under 2 min.     Baseline  eval - placed 5 pegs in 2 min.    Time  4    Period  Weeks    Status  Not Met        OT Long Term Goals - 06/02/18 1144      OT LONG TERM GOAL #1   Title  Pt to perform functional  mid to high level reaching LUE w/ only min compensations and no pain - 08/03/18    Time  8    Period  Weeks    Status  New      OT LONG TERM GOAL #2   Title  Pt to improve Lt hand coordination as evidenced by performing 9 hole peg test in 90 sec. or under    Time  8    Period  Weeks    Status  New      OT LONG TERM GOAL #3   Title  Pt to improve grip strength Lt hand to 40 lbs or greater to assist w/ opening jars/containers    Baseline  eval: 20 lbs    Time  8    Period  Weeks    Status  New      OT LONG TERM GOAL #4   Title  Pt to demo full composite flexion Lt hand for coordination/grasping tasks    Baseline  eval: 75%    Time  8    Period  Weeks    Status  New      OT LONG TERM GOAL #5   Title  Pt to be able to tie shoe laces and hook/unhook buttons using Lt hand as assist    Time  8    Period  Weeks    Status  New      Long Term Additional Goals   Additional Long Term Goals  Yes      OT LONG TERM GOAL #6   Title  Pt to return to simple cooking and cleaning tasks from standing level w/o LOB using countertop support prn    Time  8    Period  Weeks  Status  New      OT LONG TERM GOAL #7   Title  Pt to return to financial management tasks w/ supervision prn    Time  8    Period  Weeks    Status  New            Plan - 07/12/18 9935    Clinical Impression Statement  Pt progressing with attention to LUE w/ less cues required    Occupational Profile and client history currently impacting functional performance  PMH; HTN, HLD, DM, A-fib. Current deficits impeding pt's ability to perform ADLS, IADLS, and role as husband, friend, and leisure participation    Occupational performance deficits (Please refer to evaluation for details):  ADL's;IADL's;Leisure    Rehab Potential  Good    OT Frequency  2x / week    OT Duration  8 weeks    OT Treatment/Interventions  Self-care/ADL training;Moist Heat;DME and/or AE instruction;Splinting;Therapeutic activities;Psychosocial  skills training;Aquatic Therapy;Therapeutic exercise;Cognitive remediation/compensation;Coping strategies training;Neuromuscular education;Functional Mobility Training;Passive range of motion;Visual/perceptual remediation/compensation;Manual Therapy;Patient/family education;Electrical Stimulation    Plan  PROGRESS NOTE, continue NMR and functional use LUE, UBE w/ Lt hand wrapped, fluidotherapy w/ Lt hand wrapped in flexion    Consulted and Agree with Plan of Care  Patient       Patient will benefit from skilled therapeutic intervention in order to improve the following deficits and impairments:  Decreased coordination, Decreased range of motion, Difficulty walking, Improper body mechanics, Decreased safety awareness, Impaired sensation, Impaired tone, Decreased activity tolerance, Decreased knowledge of precautions, Impaired UE functional use, Pain, Decreased knowledge of use of DME, Decreased balance, Decreased cognition, Decreased mobility, Decreased strength  Visit Diagnosis: 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 Spastic hemiplegia affecting nondominant side ([place]) 07/07/2018  . Abnormality of gait 06/09/2018  . Neuropathic pain   . Labile blood pressure   . PAF (paroxysmal atrial fibrillation) ([place])   . Hemiparesis affecting left side as late effect of stroke ([place])   . 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 Benign essential HTN   . Dyslipidemia   . Hemorrhagic stroke ([place])   . ICH (intracerebral hemorrhage) ([place]) 03/12/2018  . Prostate cancer ([place]) 01/29/2017  . 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 Diabetes ([place]) 12/14/2015  . PCP NOTES >>>>>>>>>>>>>>>>>>>>>>>>>>>>>>. 08/06/2015  . Dizziness and giddiness  01/29/2015  . Umbilical hernia 70/17/7939  . Elevated PSA, less than 10 ng/ml 04/19/2012  . Annual physical exam 01/24/2011  . Hyperlipidemia 01/03/2010  . Essential hypertension 09/20/2007    Carey Bullocks, OTR/L 07/12/2018, 9:24 AM  [facility] [address] [place], [place], [address] Phone: [phone]   Fax:  [phone]  Name: GARVEY WESTCOTT MRN: [id number] Date of Birth: 01/29/1946

## 2018-07-13 NOTE — Therapy (Signed)
[facility] [address] [place], [place], [address] Phone: [phone]   Fax:  [phone]  Physical Therapy Treatment  Patient Details  Name: Marcus Beasley MRN: [id number] Date of Birth: 20-Jun-1946 No data recorded  Encounter Date: 07/12/2018  PT End of Session - 07/13/18 1337    Visit Number  9    Number of Visits  17    Date for PT Re-Evaluation  08/31/18    Authorization Type  [employer] Medicare; will need 10th visit progress notes    PT Start Time  0935    PT Stop Time  1030    PT Time Calculation (min)  55 min    Equipment Utilized During Treatment  Gait belt    Activity Tolerance  Patient tolerated treatment well    Behavior During Therapy  WFL for tasks assessed/performed       Past Medical History:  Diagnosis Date  . BPH (benign prostatic hyperplasia)    (-) Bx 2015  . Diabetes mellitus without complication ([place])   . Elevated PSA    Prostate Bx in 06/2013 was benign  . HTN (hypertension)   . Hyperlipidemia   . Macular degeneration, age related     Past Surgical History:  Procedure Laterality Date  . ABCESS DRAINAGE     abdomen- 26 day hospitalization 1968  . COLONOSCOPY  2016  . HERNIA REPAIR  summer '11   umbilical, dr Ninfa Linden   . POLYPECTOMY    . PROSTATE BIOPSY  06-2013 , 07-2017   (-), (-)    There were no vitals filed for this visit.  Subjective Assessment - 07/12/18 0956    Subjective  Doing okay-no changes since last visit.    Patient is accompained by:  Family member   friend   Limitations  Walking;Standing    Patient Stated Goals  Pt wants to be able to walk again.  Wants to be able to use L hand and arm again; wants to drive, do yardwork.    Currently in Pain?  Yes    Pain Score  6     Pain Location  Hand    Pain Orientation  Left    Pain Descriptors / Indicators  Burning    Pain Onset  More than a month ago    Pain Frequency  Intermittent    Aggravating Factors   weightbearing     Pain Relieving Factors  rest                       [facility] Adult PT Treatment/Exercise - 07/13/18 0001      Ambulation/Gait   Ambulation/Gait  Yes    Ambulation/Gait Assistance  4: Min assist    Ambulation Distance (Feet)  --   see below   Assistive device  Rolling walker;Straight cane   straight cane with quad tip   Gait Pattern  Step-through pattern;Decreased step length - left;Decreased stance time - left;Decreased dorsiflexion - left;Poor foot clearance - left;Left flexed knee in stance;Trunk rotated posteriorly on left;Narrow base of support    Ambulation Surface  Level;Indoor    Stairs  Yes    Stairs Assistance  4: Min guard    Stair Management Technique  One rail Left;Step to pattern;Forwards    Number of Stairs  4   x 2 trials, no brace or AFO   Height of Stairs  6    Gait Comments  Orthotist present with the following observations/recommendations:  Pt ambulates 115 ft no brace, no AFO with RW, with 1 episode of L knee giving way, decreased L foot clearance and step length.  Pt ambulates 115 ft with trial of Thusane plus (PLS and ground reaction) with RW-pt takes increased LLE step length, no knee instability, but increased LLE external rotation and narrowed BOS foot placement in walker.  Pt ambulates 230 ft with Thusane PLS with RW (improved foot clearance and foot placement, improved comfort), then 115 ft with cane with Thusane PLS (one episode of knee instability, but improved foot clearance and step length.  Overall, pt feels most comfortable with Thusane PLS.  Orthotist and PT recommend pt discontinue use of knee cage at home due to transitioning to training with AFO for improved knee control and LLE stability.      Self-Care   Self-Care  Other Self-Care Comments    Other Self-Care Comments   Discussed options for AFO (ankle foot orthosis), with orthotist present for recommendations.  Pt in agreement to proceed with Thusane PLS, which appears to have helped L  foot clearance and step length (especially when using cane)      Knee/Hip Exercises: Aerobic   Stepper  Seated SciFit Stepper, 8 minutes, Level 1.4, lower extremities only, for leg strengthening.  Cues for neutral knee positioning to avoid external rotation of LLE.      With RW use, pt uses L hand orthosis for LUE positioning       PT Education - 07/13/18 1335    Education Details  Discontinue use of knee cage at home; will still need family close supervision for gait, and stairs; recommendation for Thusane PLS    Person(s) Educated  Patient;Other (comment)   Friend   Methods  Explanation    Comprehension  Verbalized understanding       PT Short Term Goals - 07/08/18 0950      PT SHORT TERM GOAL #1   Title  Pt will be independent with HEP for improved strength, balance, gait.  TARGET 07/02/18 (may be modified due to scheduling around holidays)    Time  4    Period  Weeks    Status  Achieved      PT SHORT TERM GOAL #2   Title  Pt will perform at least 8 of 10 reps of sit<>stand transfers independently, minimal to no UE support, for improved lower extremity strengthening and transfer efficiency.    Time  4    Period  Weeks    Status  Achieved      PT SHORT TERM GOAL #3   Title  Pt will improve Berg Balance score to at least 27/56 for decreased fall risk.    Baseline  34/56 07/05/18    Time  4    Period  Weeks    Status  Achieved      PT SHORT TERM GOAL #4   Title  Pt will improve TUG score to less than or equal to 25 seconds for decreased fall risk.    Baseline  30.66 sec, 28.84 sec with cane    Time  4    Period  Weeks    Status  Not Met      PT SHORT TERM GOAL #5   Title  Pt will verbalize understanding of fall prevention in home environment.    Time  4    Period  Weeks    Status  Achieved        PT Long Term Goals -  06/04/18 1610      PT LONG TERM GOAL #1   Title  Pt will verbalize plans for continued community fitness upon d/c from PT.  TARGET 07/30/18     Time  8    Period  Weeks    Status  New    Target Date  07/30/18      PT LONG TERM GOAL #2   Title  Pt will improve gait velocity to at least 1.8 ft/sec for improved gait efficiency and safety.    Time  8    Period  Weeks    Status  New    Target Date  07/30/18      PT LONG TERM GOAL #3   Title  Pt will improve Berg score to at least 37/56 for decreased fall risk.    Time  8    Period  Weeks    Status  New    Target Date  07/30/18      PT LONG TERM GOAL #4   Title  Pt will improve TUG score to less than or equal to 15 seconds for decreased fall risk.    Time  8    Period  Weeks    Status  New    Target Date  07/30/18      PT LONG TERM GOAL #5   Title  Pt will ambulate at least 150 ft using least restrictive assistive device, modified independently, for improved independence with gait.    Time  8    Period  Weeks    Status  New    Target Date  07/30/18      Additional Long Term Goals   Additional Long Term Goals  Yes      PT LONG TERM GOAL #6   Title  Pt will negotiate at least 12 steps, one handrail, step-through pattern, modified independently, for improved stair negotiation in home.    Time  8    Period  Weeks    Status  New    Target Date  07/30/18            Plan - 07/13/18 1337    Clinical Impression Statement  Orthotist present today, with patient trialing gait with no brace/AFO, with Thusane Plus AFO and Thusane PLS AFO.  Pt seems to have best comfort, improved control and improved foot clearance and step length with use of Thusane PLS AFO.  As pt's strength and LLE is progressing, recommend pt no longer use knee cage at home (instructed to continue to have family close supervision with gait in case of L knee weakness).  Pt does demonstrate likely L hip weakness, as even with trial of AFOs, he has decreased timing of swing through phase of gait.  Pt will continue to benefit from further skilled PT to address gait mechanics, neuro-reeducation, strengthening  for improved functional mobility, and decreased fall risk.    Rehab Potential  Good    Clinical Impairments Affecting Rehab Potential  good family support; independnet prior to CVA    PT Frequency  2x / week    PT Duration  8 weeks   plus eval   PT Treatment/Interventions  ADLs/Self Care Home Management;Electrical Stimulation;Therapeutic exercise;Therapeutic activities;Functional mobility training;Gait training;Stair training;DME Instruction;Balance training;Neuromuscular re-education;Patient/family education;Orthotic Fit/Training;Manual techniques    PT Next Visit Plan  Use of SciFit seated stepper for LLE strengthening; standing exercises for balance, SLS, LLE strengthening and NMR with lessening support; gait training with Thusane PLS and RW/cane; 10th visit  progress note next visit    Consulted and Agree with Plan of Care  Patient;Family member/caregiver    Family Member Consulted  friend       Patient will benefit from skilled therapeutic intervention in order to improve the following deficits and impairments:  Abnormal gait, Decreased balance, Decreased mobility, Difficulty walking, Decreased strength, Impaired tone, Postural dysfunction  Visit Diagnosis: Other abnormalities of gait and mobility  Muscle weakness (generalized)     Problem List Patient Active Problem List   Diagnosis Date Noted  . Spastic hemiplegia affecting nondominant side ([place]) 07/07/2018  . Abnormality of gait 06/09/2018  . Neuropathic pain   . Labile blood pressure   . PAF (paroxysmal atrial fibrillation) ([place])   . Hemiparesis affecting left side as late effect of stroke ([place])   . Thalamic hemorrhage ([place]) 03/17/2018  . Benign essential HTN   . Dyslipidemia   . Hemorrhagic stroke ([place])   . ICH (intracerebral hemorrhage) ([place]) 03/12/2018  . Prostate cancer ([place]) 01/29/2017  . 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 Diabetes ([place]) 12/14/2015  . PCP NOTES >>>>>>>>>>>>>>>>>>>>>>>>>>>>>>.  08/06/2015  . Dizziness and giddiness 01/29/2015  . Umbilical hernia 28/24/1753  . Elevated PSA, less than 10 ng/ml 04/19/2012  . Annual physical exam 01/24/2011  . Hyperlipidemia 01/03/2010  . Essential hypertension 09/20/2007    Younique Casad W. 07/13/2018, 1:43 PM Frazier Butt., PT  [facility] [address] [place], [place], [address] Phone: [phone]   Fax:  [phone]  Name: CORYDON SCHWEISS MRN: [id number] Date of Birth: 31-Jan-1946

## 2018-07-15 ENCOUNTER — Encounter: Payer: Self-pay | Admitting: Physical Therapy

## 2018-07-15 ENCOUNTER — Ambulatory Visit: Payer: Medicare Other | Admitting: Physical Therapy

## 2018-07-15 ENCOUNTER — Ambulatory Visit: Payer: Medicare Other | Admitting: Occupational Therapy

## 2018-07-15 DIAGNOSIS — R2681 Unsteadiness on feet: Secondary | ICD-10-CM

## 2018-07-15 DIAGNOSIS — R2689 Other abnormalities of gait and mobility: Secondary | ICD-10-CM

## 2018-07-15 DIAGNOSIS — M6281 Muscle weakness (generalized): Secondary | ICD-10-CM

## 2018-07-15 DIAGNOSIS — R278 Other lack of coordination: Secondary | ICD-10-CM | POA: Diagnosis not present

## 2018-07-15 DIAGNOSIS — R208 Other disturbances of skin sensation: Secondary | ICD-10-CM | POA: Diagnosis not present

## 2018-07-15 DIAGNOSIS — I69254 Hemiplegia and hemiparesis following other nontraumatic intracranial hemorrhage affecting left non-dominant side: Secondary | ICD-10-CM | POA: Diagnosis not present

## 2018-07-15 DIAGNOSIS — I69354 Hemiplegia and hemiparesis following cerebral infarction affecting left non-dominant side: Secondary | ICD-10-CM | POA: Diagnosis not present

## 2018-07-15 NOTE — Therapy (Signed)
[facility] [address] [place], [place], [address] Phone: [phone]   Fax:  [phone]  Physical Therapy Treatment  Patient Details  Name: Marcus Beasley MRN: [id number] Date of Birth: July 06, 1945 No data recorded  Encounter Date: 07/15/2018  PT End of Session - 07/15/18 1028    Visit Number  10    Number of Visits  17    Date for PT Re-Evaluation  08/31/18    Authorization Type  [employer] Medicare; will need 10th visit progress notes    PT Start Time  0800    PT Stop Time  0849    PT Time Calculation (min)  49 min    Equipment Utilized During Treatment  Gait belt    Activity Tolerance  Patient tolerated treatment well    Behavior During Therapy  WFL for tasks assessed/performed       Past Medical History:  Diagnosis Date  . BPH (benign prostatic hyperplasia)    (-) Bx 2015  . Diabetes mellitus without complication ([place])   . Elevated PSA    Prostate Bx in 06/2013 was benign  . HTN (hypertension)   . Hyperlipidemia   . Macular degeneration, age related     Past Surgical History:  Procedure Laterality Date  . ABCESS DRAINAGE     abdomen- 26 day hospitalization 1968  . COLONOSCOPY  2016  . HERNIA REPAIR  summer '11   umbilical, dr Ninfa Linden   . POLYPECTOMY    . PROSTATE BIOPSY  06-2013 , 07-2017   (-), (-)    There were no vitals filed for this visit.  Subjective Assessment - 07/15/18 0805    Subjective  Been doing a little walking since last visit without the knee cage at home.    Patient is accompained by:  Family member   friend   Limitations  Walking;Standing    Patient Stated Goals  Pt wants to be able to walk again.  Wants to be able to use L hand and arm again; wants to drive, do yardwork.    Currently in Pain?  Yes    Pain Score  4     Pain Location  Hand    Pain Orientation  Left    Pain Descriptors / Indicators  Burning    Pain Onset  More than a month ago    Pain Frequency  Intermittent     Aggravating Factors   weightbearing    Pain Relieving Factors  rest                       [facility] Adult PT Treatment/Exercise - 07/15/18 0001      Transfers   Transfers  Sit to Stand;Stand to Sit    Sit to Stand  6: Modified independent (Device/Increase time);Without upper extremity assist;From chair/3-in-1;With upper extremity assist;From bed    Stand to Sit  6: Modified independent (Device/Increase time);Without upper extremity assist;With upper extremity assist;To chair/3-in-1;To bed    Number of Reps  Other reps (comment)   5 reps throughout session   Transfer Cueing  Cues for even foot placement      Ambulation/Gait   Ambulation/Gait  Yes    Ambulation/Gait Assistance  4: Min assist    Ambulation/Gait Assistance Details  Cues provided for hip flexion, foot clearance, heelstrike and neutral foot position (to avoid LLE external rotation)    Ambulation Distance (Feet)  30 Feet   then 150, 40 ft   Assistive  device  Straight cane   rubber quad tip   Gait Pattern  Step-through pattern;Decreased step length - left;Decreased stance time - left;Decreased dorsiflexion - left;Poor foot clearance - left;Left flexed knee in stance;Trunk rotated posteriorly on left;Narrow base of support    Gait Comments  Used trial of Thusane PLS during PT session today.  No episodes of L knee buckling with gait or with standing exercises.      Knee/Hip Exercises: Aerobic   Stepper  Seated SciFit Stepper, 5 minutes, Level 1.5, lower extremities only, for leg strengthening.  Cues for neutral knee positioning to avoid external rotation of LLE.  Pt attentive to increasing RPM from 71 to 96 for improved intensity of leg strengthening activity.          Balance Exercises - 07/15/18 0829      Balance Exercises: Standing   Step Ups  Forward;4 inch;UE support 2   LLE leading 2 sets x 5 reps   Retro Gait  Upper extremity support;5 reps   Forward/back in parallel bars   Sidestepping  Upper  extremity support;5 reps   R and L in parallel bars   Marching Limitations  Alternating legs, 10 reps in parallel bars, bilateral UE support, then LLE hip and knee flexion x 10 reps-cues for foot placement and to decreased L hip external rotation.    Other Standing Exercises  LLE SLS activities with R hip/knee flexion x 10 reps, RLE propped on 4" block, with cues for L terminal knee extension and L glut activation for upright posture, lessening RUE support, x 5 reps.  Cues provided throughout NMR activities for upright posture through LLE for improved glut and quad activation.          PT Short Term Goals - 07/08/18 0950      PT SHORT TERM GOAL #1   Title  Pt will be independent with HEP for improved strength, balance, gait.  TARGET 07/02/18 (may be modified due to scheduling around holidays)    Time  4    Period  Weeks    Status  Achieved      PT SHORT TERM GOAL #2   Title  Pt will perform at least 8 of 10 reps of sit<>stand transfers independently, minimal to no UE support, for improved lower extremity strengthening and transfer efficiency.    Time  4    Period  Weeks    Status  Achieved      PT SHORT TERM GOAL #3   Title  Pt will improve Berg Balance score to at least 27/56 for decreased fall risk.    Baseline  34/56 07/05/18    Time  4    Period  Weeks    Status  Achieved      PT SHORT TERM GOAL #4   Title  Pt will improve TUG score to less than or equal to 25 seconds for decreased fall risk.    Baseline  30.66 sec, 28.84 sec with cane    Time  4    Period  Weeks    Status  Not Met      PT SHORT TERM GOAL #5   Title  Pt will verbalize understanding of fall prevention in home environment.    Time  4    Period  Weeks    Status  Achieved        PT Long Term Goals - 07/15/18 1036      PT LONG TERM GOAL #1   Title  Pt will verbalize plans for continued community fitness upon d/c from PT.  TARGET 07/30/18    Time  8    Period  Weeks    Status  On-going      PT LONG  TERM GOAL #2   Title  Pt will improve gait velocity to at least 1.8 ft/sec for improved gait efficiency and safety.    Time  8    Period  Weeks    Status  On-going      PT LONG TERM GOAL #3   Title  Pt will improve Berg score to at least 40/56 for decreased fall risk.    Time  8    Period  Weeks    Status  Revised      PT LONG TERM GOAL #4   Title  Pt will improve TUG score to less than or equal to 20 seconds for decreased fall risk.    Time  8    Period  Weeks    Status  Revised      PT LONG TERM GOAL #5   Title  Pt will ambulate at least 150 ft using least restrictive assistive device, modified independently, for improved independence with gait.    Time  8    Period  Weeks    Status  On-going      PT LONG TERM GOAL #6   Title  Pt will negotiate at least 12 steps, one handrail, step-through pattern, modified independently, for improved stair negotiation in home.    Time  8    Period  Weeks    Status  On-going            Plan - 07/15/18 1029    Clinical Impression Statement  10th visit PROGRESS NOTE today, with visits from 06/04/18-07/15/2018.  At goals checked 07/05/2018, pt has improved Berg Balance score to 34/56 (from 20/56), TUG score from 33.25 sec at eval with RW to 28.84 sec using cane with quad tip.  Pt has transitioned to not using knee cage, as pt's lower extremity strength is improving.  AFO consult recommendations on 07/13/2018 were to transition to a posterior leaf spring AFO to help with improved foot clearance and knee control.  Pt is very motivated for therapy and is performing HEP at home.  He does continue to have LLE weakness, decreased LLE control, decreased balance (still at fall risk per Merrilee Jansky and TUG scores), and will continue to benefit from skilled PT to further address these deficits for improved functional mobility, independence and decrease fall risk.    Rehab Potential  Good    Clinical Impairments Affecting Rehab Potential  good family support;  independnet prior to CVA    PT Frequency  2x / week    PT Duration  8 weeks   plus eval   PT Treatment/Interventions  ADLs/Self Care Home Management;Electrical Stimulation;Therapeutic exercise;Therapeutic activities;Functional mobility training;Gait training;Stair training;DME Instruction;Balance training;Neuromuscular re-education;Patient/family education;Orthotic Fit/Training;Manual techniques    PT Next Visit Plan  Use of SciFit seated stepper for LLE strengthening; standing exercises for balance, SLS, LLE strengthening and NMR with lessening support; gait training with Thusane PLS and RW/cane    Consulted and Agree with Plan of Care  Patient;Family member/caregiver    Family Member Consulted  friend       Patient will benefit from skilled therapeutic intervention in order to improve the following deficits and impairments:  Abnormal gait, Decreased balance, Decreased mobility, Difficulty walking, Decreased strength, Impaired tone, Postural dysfunction  Visit  Diagnosis: Other abnormalities of gait and mobility  Muscle weakness (generalized)  Unsteadiness on feet     Problem List Patient Active Problem List   Diagnosis Date Noted  . Spastic hemiplegia affecting nondominant side ([place]) 07/07/2018  . Abnormality of gait 06/09/2018  . Neuropathic pain   . Labile blood pressure   . PAF (paroxysmal atrial fibrillation) ([place])   . Hemiparesis affecting left side as late effect of stroke ([place])   . Thalamic hemorrhage ([place]) 03/17/2018  . Benign essential HTN   . Dyslipidemia   . Hemorrhagic stroke ([place])   . ICH (intracerebral hemorrhage) ([place]) 03/12/2018  . Prostate cancer ([place]) 01/29/2017  . 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 Diabetes ([place]) 12/14/2015  . PCP NOTES >>>>>>>>>>>>>>>>>>>>>>>>>>>>>>. 08/06/2015  . Dizziness and giddiness 01/29/2015  . Umbilical hernia 04/22/2810  . Elevated PSA, less than 10 ng/ml 04/19/2012  . Annual physical exam 01/24/2011  .  Hyperlipidemia 01/03/2010  . Essential hypertension 09/20/2007    Samanatha Brammer W. 07/15/2018, 10:38 AM  [facility] [address] [place], [place], [address] Phone: [phone]   Fax:  [phone]  Name: Marcus Beasley MRN: [id number] Date of Birth: 06-20-1946

## 2018-07-15 NOTE — Therapy (Signed)
[facility] [address] [place], [place], [address] Phone: [phone]   Fax:  [phone]  Occupational Therapy Treatment  Patient Details  Name: Marcus Beasley MRN: [id number] Date of Birth: Jun 22, 1946 Referring Provider (OT): Venancio Poisson   Encounter Date: 07/15/2018  OT End of Session - 07/15/18 0929    Visit Number  10    Number of Visits  17    Date for OT Re-Evaluation  08/03/18    Authorization Type  [employer] MCR    Authorization - Visit Number  10    Authorization - Number of Visits  10    OT Start Time  0845    OT Stop Time  0930    OT Time Calculation (min)  45 min    Activity Tolerance  Patient tolerated treatment well    Behavior During Therapy  [facility] for tasks assessed/performed       Past Medical History:  Diagnosis Date  . BPH (benign prostatic hyperplasia)    (-) Bx 2015  . Diabetes mellitus without complication ([place])   . Elevated PSA    Prostate Bx in 06/2013 was benign  . HTN (hypertension)   . Hyperlipidemia   . Macular degeneration, age related     Past Surgical History:  Procedure Laterality Date  . ABCESS DRAINAGE     abdomen- 26 day hospitalization 1968  . COLONOSCOPY  2016  . HERNIA REPAIR  summer '11   umbilical, dr Ninfa Linden   . POLYPECTOMY    . PROSTATE BIOPSY  06-2013 , 07-2017   (-), (-)    There were no vitals filed for this visit.  Subjective Assessment - 07/15/18 0851    Pertinent History  Rt thalamic hemorrhage 03/12/18. PMH: HTN, HLD, DM, A-fib    Limitations  Fall risk    Patient Stated Goals  get more function in my Lt arm and hand    Currently in Pain?  Yes    Pain Score  4     Pain Location  Hand    Pain Orientation  Left    Pain Descriptors / Indicators  Burning    Pain Onset  More than a month ago    Pain Frequency  Intermittent    Aggravating Factors   Squeezing hand, weight bearing    Pain Relieving Factors  rest                   OT  Treatments/Exercises (OP) - 07/15/18 0930      Shoulder Exercises: ROM/Strengthening   UBE (Upper Arm Bike)  UBE x 5 min. Level 1 w/ Lt hand wrapped for reciprocal movement pattern      Functional Reaching Activities   Mid Level  Mid Level reaching LUE to retrieve small cones tabletop surface then place to Lt side for abduction and elbow extension. Then replaced on tabletop. Min cues for proper positioning w/ reaching patterns. BUE mid level reaching to move ball in various planes      LUE Fluidotherapy   Number Minutes Fluidotherapy  10 Minutes    LUE Fluidotherapy Location  Hand    Comments  w/ Lt hand wrapped to improved composite flexion               OT Short Term Goals - 07/08/18 0947      OT SHORT TERM GOAL #1   Title  Pt independent with LUE HEP for neuro re-education - 07/03/18    Time  4    Period  Weeks    Status  Achieved      OT SHORT TERM GOAL #2   Title  Pt independent with coordination and putty HEP Lt hand    Time  4    Period  Weeks    Status  Achieved      OT SHORT TERM GOAL #3   Title  Pt to verbalize understanding with safety considerations LUE d/t lack of sensation    Time  4    Period  Weeks    Status  Achieved      OT SHORT TERM GOAL #4   Title  Pt to cut food mod I level with A/E prn    Time  4    Period  Weeks    Status  Achieved   IN CLINIC     OT SHORT TERM GOAL #5   Title  Pt to consistently don/doff shirt I'ly and don/doff socks I'ly w/ task modifications prn    Time  4    Period  Weeks    Status  Achieved   IN CLINIC     OT SHORT TERM GOAL #6   Title  Pt to verbalize understanding and consistently demo correct reaching pattern LUE to prevent pain Lt shoulder    Time  4    Period  Weeks    Status  On-going      OT SHORT TERM GOAL #7   Title  Pt to improve coordination Lt hand as evidenced by performing 9 hole peg test in under 2 min.     Baseline  eval - placed 5 pegs in 2 min.    Time  4    Period  Weeks    Status  Not  Met        OT Long Term Goals - 06/02/18 1144      OT LONG TERM GOAL #1   Title  Pt to perform functional mid to high level reaching LUE w/ only min compensations and no pain - 08/03/18    Time  8    Period  Weeks    Status  New      OT LONG TERM GOAL #2   Title  Pt to improve Lt hand coordination as evidenced by performing 9 hole peg test in 90 sec. or under    Time  8    Period  Weeks    Status  New      OT LONG TERM GOAL #3   Title  Pt to improve grip strength Lt hand to 40 lbs or greater to assist w/ opening jars/containers    Baseline  eval: 20 lbs    Time  8    Period  Weeks    Status  New      OT LONG TERM GOAL #4   Title  Pt to demo full composite flexion Lt hand for coordination/grasping tasks    Baseline  eval: 75%    Time  8    Period  Weeks    Status  New      OT LONG TERM GOAL #5   Title  Pt to be able to tie shoe laces and hook/unhook buttons using Lt hand as assist    Time  8    Period  Weeks    Status  New      Long Term Additional Goals   Additional Long Term Goals  Yes      OT  LONG TERM GOAL #6   Title  Pt to return to simple cooking and cleaning tasks from standing level w/o LOB using countertop support prn    Time  8    Period  Weeks    Status  New      OT LONG TERM GOAL #7   Title  Pt to return to financial management tasks w/ supervision prn    Time  8    Period  Weeks    Status  New            Plan - 07/15/18 0941    Clinical Impression Statement  This progress note covers dates 06/02/18 to 07/15/18 - Pt has met 5/7 STG's. Pt continues to make progress w/ LUE use, attention to Lt side, and ADL status.     Occupational Profile and client history currently impacting functional performance  PMH; HTN, HLD, DM, A-fib. Current deficits impeding pt's ability to perform ADLS, IADLS, and role as husband, friend, and leisure participation    Occupational performance deficits (Please refer to evaluation for details):  ADL's;IADL's;Leisure     Rehab Potential  Good    OT Frequency  2x / week    OT Duration  8 weeks    OT Treatment/Interventions  Self-care/ADL training;Moist Heat;DME and/or AE instruction;Splinting;Therapeutic activities;Psychosocial skills training;Aquatic Therapy;Therapeutic exercise;Cognitive remediation/compensation;Coping strategies training;Neuromuscular education;Functional Mobility Training;Passive range of motion;Visual/perceptual remediation/compensation;Manual Therapy;Patient/family education;Electrical Stimulation    Plan  Continue NMR and functional reaching LUE, UBE    Consulted and Agree with Plan of Care  Patient       Patient will benefit from skilled therapeutic intervention in order to improve the following deficits and impairments:  Decreased coordination, Decreased range of motion, Difficulty walking, Improper body mechanics, Decreased safety awareness, Impaired sensation, Impaired tone, Decreased activity tolerance, Decreased knowledge of precautions, Impaired UE functional use, Pain, Decreased knowledge of use of DME, Decreased balance, Decreased cognition, Decreased mobility, Decreased strength  Visit Diagnosis: 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 Spastic hemiplegia affecting nondominant side ([place]) 07/07/2018  . Abnormality of gait 06/09/2018  . Neuropathic pain   . Labile blood pressure   . PAF (paroxysmal atrial fibrillation) ([place])   . Hemiparesis affecting left side as late effect of stroke ([place])   . 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 Benign essential HTN   . Dyslipidemia   . Hemorrhagic stroke ([place])   . ICH (intracerebral hemorrhage) ([place]) 03/12/2018  . Prostate cancer ([place]) 01/29/2017  . 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 Diabetes ([place])  12/14/2015  . PCP NOTES >>>>>>>>>>>>>>>>>>>>>>>>>>>>>>. 08/06/2015  . Dizziness and giddiness 01/29/2015  . Umbilical hernia 19/14/7829  . Elevated PSA, less than 10 ng/ml 04/19/2012  . Annual physical exam 01/24/2011  . Hyperlipidemia 01/03/2010  . Essential hypertension 09/20/2007    Carey Bullocks, OTR/L 07/15/2018, 9:52 AM  [facility] [address] [place], [place], [address] Phone: [phone]   Fax:  [phone]  Name: DAYMON HORA MRN: [id number] Date of Birth: 1945/07/15

## 2018-07-19 ENCOUNTER — Encounter: Payer: Self-pay | Admitting: Occupational Therapy

## 2018-07-19 ENCOUNTER — Ambulatory Visit: Payer: Medicare Other | Admitting: Occupational Therapy

## 2018-07-19 ENCOUNTER — Ambulatory Visit: Payer: Medicare Other | Admitting: Physical Therapy

## 2018-07-19 ENCOUNTER — Encounter: Payer: Self-pay | Admitting: Physical Therapy

## 2018-07-19 DIAGNOSIS — R208 Other disturbances of skin sensation: Secondary | ICD-10-CM

## 2018-07-19 DIAGNOSIS — I69254 Hemiplegia and hemiparesis following other nontraumatic intracranial hemorrhage affecting left non-dominant side: Secondary | ICD-10-CM | POA: Diagnosis not present

## 2018-07-19 DIAGNOSIS — M6281 Muscle weakness (generalized): Secondary | ICD-10-CM

## 2018-07-19 DIAGNOSIS — R2681 Unsteadiness on feet: Secondary | ICD-10-CM | POA: Diagnosis not present

## 2018-07-19 DIAGNOSIS — R2689 Other abnormalities of gait and mobility: Secondary | ICD-10-CM | POA: Diagnosis not present

## 2018-07-19 DIAGNOSIS — R278 Other lack of coordination: Secondary | ICD-10-CM | POA: Diagnosis not present

## 2018-07-19 NOTE — Therapy (Signed)
[facility] [address] [place], [place], [address] Phone: [phone]   Fax:  [phone]  Physical Therapy Treatment  Patient Details  Name: Marcus Beasley MRN: [id number] Date of Birth: [birth year] No data recorded  Encounter Date: 07/19/2018  PT End of Session - 07/19/18 1604    Visit Number  11    Number of Visits  17    Date for PT Re-Evaluation  08/31/18    Authorization Type  [employer]HC Medicare; will need 10th visit progress notes    PT Start Time  0932    PT Stop Time  1018    PT Time Calculation (min)  46 min    Equipment Utilized During Treatment  Gait belt    Activity Tolerance  Patient tolerated treatment well    Behavior During Therapy  WFL for tasks assessed/performed       Past Medical History:  Diagnosis Date  . BPH (benign prostatic hyperplasia)    (-) Bx 2015  . Diabetes mellitus without complication ([place])   . Elevated PSA    Prostate Bx in 06/2013 was benign  . HTN (hypertension)   . Hyperlipidemia   . Macular degeneration, age related     Past Surgical History:  Procedure Laterality Date  . ABCESS DRAINAGE     abdomen- 26 day hospitalization 1968  . COLONOSCOPY  2016  . HERNIA REPAIR  summer '11   umbilical, dr Ninfa Linden   . POLYPECTOMY    . PROSTATE BIOPSY  06-2013 , 07-2017   (-), (-)    There were no vitals filed for this visit.  Subjective Assessment - 07/19/18 0927    Subjective  Walking going pretty good at home.  No episodes with L knee buckling, but I don't always put too much weight on it.    Patient is accompained by:  Family member   friend   Limitations  Walking;Standing    Patient Stated Goals  Pt wants to be able to walk again.  Wants to be able to use L hand and arm again; wants to drive, do yardwork.    Currently in Pain?  No/denies    Pain Onset  More than a month ago                       [facility] Adult PT Treatment/Exercise - 07/19/18 0001       Transfers   Transfers  Sit to Stand;Stand to Sit    Sit to Stand  6: Modified independent (Device/Increase time);Without upper extremity assist;From chair/3-in-1;With upper extremity assist;From bed    Stand to Sit  6: Modified independent (Device/Increase time);Without upper extremity assist;With upper extremity assist;To chair/3-in-1;To bed    Number of Reps  Other reps (comment)   5 reps     Ambulation/Gait   Ambulation/Gait  Yes    Ambulation/Gait Assistance  4: Min assist    Ambulation/Gait Assistance Details  Tactile, verbal cues for L hip flexion to initiate foot clearance, for foot placement.    Ambulation Distance (Feet)  180 Feet   x 2 with cane, L PLS AFO; 230 ft with RW   Assistive device  Straight cane;Rolling walker   LLE Thusane PLS AFO; RW with hand orthosis   Gait Pattern  Step-through pattern;Decreased step length - left;Decreased stance time - left;Decreased dorsiflexion - left;Poor foot clearance - left;Left flexed knee in stance;Trunk rotated posteriorly on left;Narrow base of support    Ambulation Surface  Level;Indoor    Gait Comments  Gait training with cane performed with trial of L Thusane PLS AFO.  Gait training with RW with LUE orthosis with no AFO (as pt would walk at home)      Knee/Hip Exercises: Aerobic   Stepper  End of session set pt up on Sci Fit Seated Stepper, (x 13 minutes), level 2, lower extremities, initial cues for neutral knee positioning (No charge, as pt performed at end of session)          Balance Exercises - 07/19/18 0945      Balance Exercises: Standing   Retro Gait  Upper extremity support;5 reps   Forward/back in parallel bars   Sidestepping  Upper extremity support;5 reps   R and L in parallel bars   Marching Limitations  LLE as stance with RLE hip/knee flexion, 2 sets x 10 reps, then alternating legs marching in place x 10 reps    Other Standing Exercises  LLE SLS activities with RLE stepping over obstacle x 10 reps, then LLE  step over obstacle, for improved step length, heelstrike, and activation in SLS through LLE, x 10 reps. Requires intermittent 1-2 UE support, tactile cues through L gluts          PT Short Term Goals - 07/08/18 0950      PT SHORT TERM GOAL #1   Title  Pt will be independent with HEP for improved strength, balance, gait.  TARGET 07/02/18 (may be modified due to scheduling around holidays)    Time  4    Period  Weeks    Status  Achieved      PT SHORT TERM GOAL #2   Title  Pt will perform at least 8 of 10 reps of sit<>stand transfers independently, minimal to no UE support, for improved lower extremity strengthening and transfer efficiency.    Time  4    Period  Weeks    Status  Achieved      PT SHORT TERM GOAL #3   Title  Pt will improve Berg Balance score to at least 27/56 for decreased fall risk.    Baseline  34/56 07/05/18    Time  4    Period  Weeks    Status  Achieved      PT SHORT TERM GOAL #4   Title  Pt will improve TUG score to less than or equal to 25 seconds for decreased fall risk.    Baseline  30.66 sec, 28.84 sec with cane    Time  4    Period  Weeks    Status  Not Met      PT SHORT TERM GOAL #5   Title  Pt will verbalize understanding of fall prevention in home environment.    Time  4    Period  Weeks    Status  Achieved        PT Long Term Goals - 07/15/18 1036      PT LONG TERM GOAL #1   Title  Pt will verbalize plans for continued community fitness upon d/c from PT.  TARGET 07/30/18    Time  8    Period  Weeks    Status  On-going      PT LONG TERM GOAL #2   Title  Pt will improve gait velocity to at least 1.8 ft/sec for improved gait efficiency and safety.    Time  8    Period  Weeks    Status  On-going      PT LONG TERM GOAL #3   Title  Pt will improve Berg score to at least 40/56 for decreased fall risk.    Time  8    Period  Weeks    Status  Revised      PT LONG TERM GOAL #4   Title  Pt will improve TUG score to less than or equal to 20  seconds for decreased fall risk.    Time  8    Period  Weeks    Status  Revised      PT LONG TERM GOAL #5   Title  Pt will ambulate at least 150 ft using least restrictive assistive device, modified independently, for improved independence with gait.    Time  8    Period  Weeks    Status  On-going      PT LONG TERM GOAL #6   Title  Pt will negotiate at least 12 steps, one handrail, step-through pattern, modified independently, for improved stair negotiation in home.    Time  8    Period  Weeks    Status  On-going            Plan - 07/19/18 1604    Clinical Impression Statement  Today's skilled PT session focused on gait training with SPC and PLS AFO as well as with RW and no AFO (as pt is currently set up for at home).  Also focused on pre-gait NMR activities to address SLS and weightbearing, foot clearance through LLE.  Pt continues to need occasional tactile and verbal cues for activation through LLE to fully extend knee and hip in stance phase.  Pt will continue to benefit from skilled PT to address strength, balance, and gait training for improved functional moiblity and independence.    Rehab Potential  Good    Clinical Impairments Affecting Rehab Potential  good family support; independnet prior to CVA    PT Frequency  2x / week    PT Duration  8 weeks   plus eval   PT Treatment/Interventions  ADLs/Self Care Home Management;Electrical Stimulation;Therapeutic exercise;Therapeutic activities;Functional mobility training;Gait training;Stair training;DME Instruction;Balance training;Neuromuscular re-education;Patient/family education;Orthotic Fit/Training;Manual techniques    PT Next Visit Plan  Try to have patient walk back into therapy (versus using w/c); also discuss with patient and with family walking at home, trying to walk into therapy more consistently; standing exercises for balance, SLS, LLE strengthening and NMR with lessening support; gait training with Thusane PLS and  RW/cane    Consulted and Agree with Plan of Care  Patient;Family member/caregiver    Family Member Consulted  friend       Patient will benefit from skilled therapeutic intervention in order to improve the following deficits and impairments:  Abnormal gait, Decreased balance, Decreased mobility, Difficulty walking, Decreased strength, Impaired tone, Postural dysfunction  Visit Diagnosis: Other abnormalities of gait and mobility  Unsteadiness on feet  Muscle weakness (generalized)     Problem List Patient Active Problem List   Diagnosis Date Noted  . Spastic hemiplegia affecting nondominant side ([place]) 07/07/2018  . Abnormality of gait 06/09/2018  . Neuropathic pain   . Labile blood pressure   . PAF (paroxysmal atrial fibrillation) ([place])   . Hemiparesis affecting left side as late effect of stroke ([name])   . 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 Benign essential HTN   . Dyslipidemia   . Hemorrhagic stroke ([place])   . ICH (intracerebral hemorrhage) ([place]) 03/12/2018  .  Prostate cancer ([place]) 01/29/2017  . 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 Diabetes ([place]) 12/14/2015  . PCP NOTES >>>>>>>>>>>>>>>>>>>>>>>>>>>>>>. 08/06/2015  . Dizziness and giddiness 01/29/2015  . Umbilical hernia 96/75/9163  . Elevated PSA, less than 10 ng/ml 04/19/2012  . Annual physical exam 01/24/2011  . Hyperlipidemia 01/03/2010  . Essential hypertension 09/20/2007    , W. 07/19/2018, 4:08 PM  Frazier Butt., PT   [facility] [address] [place], [place], [address] Phone: [phone]   Fax:  [phone]  Name: STRATTON VILLWOCK MRN: [id number] Date of Birth: [birth year]

## 2018-07-19 NOTE — Therapy (Signed)
[facility] [address] [place], [place], [address] Phone: [phone]   Fax:  [phone]  Occupational Therapy Treatment  Patient Details  Name: Marcus Beasley MRN: [id number] Date of Birth: 05/05/1946 Referring Provider (OT): Venancio Poisson   Encounter Date: 07/19/2018  OT End of Session - 07/19/18 1032    Visit Number  11    Number of Visits  17    Date for OT Re-Evaluation  08/03/18    Authorization Type  [employer] MCR    Authorization - Visit Number  11    Authorization - Number of Visits  20    OT Start Time  0846    OT Stop Time  0930    OT Time Calculation (min)  44 min    Activity Tolerance  Patient tolerated treatment well       Past Medical History:  Diagnosis Date  . BPH (benign prostatic hyperplasia)    (-) Bx 2015  . Diabetes mellitus without complication ([place])   . Elevated PSA    Prostate Bx in 06/2013 was benign  . HTN (hypertension)   . Hyperlipidemia   . Macular degeneration, age related     Past Surgical History:  Procedure Laterality Date  . ABCESS DRAINAGE     abdomen- 26 day hospitalization 1968  . COLONOSCOPY  2016  . HERNIA REPAIR  summer '11   umbilical, dr Ninfa Linden   . POLYPECTOMY    . PROSTATE BIOPSY  06-2013 , 07-2017   (-), (-)    There were no vitals filed for this visit.  Subjective Assessment - 07/19/18 0853    Subjective   My fingers are a little stiff but no true pain    Patient is accompained by:  Family member   friend Lurena Joiner   Pertinent History  Rt thalamic hemorrhage 03/12/18. PMH: HTN, HLD, DM, A-fib    Limitations  Fall risk    Patient Stated Goals  get more function in my Lt arm and hand    Currently in Pain?  No/denies                   OT Treatments/Exercises (OP) - 07/19/18 1027      ADLs   UB Dressing  Addressed doffing coat with cues and min a to use LUE during activity.  Without cues, pt was completing one handed.       Neurological  Re-education Exercises   Other Exercises 1  Neuro re ed to address LUE proximal control initially in closed chain with weight bearing and body on arm activities. Progressed to closed chain activity with mod facilitation for postural alignment with mid to beginning high reach and faciltation for proximal and distal control. Pt requires cues to attend visually to LUE for motor persistence.  Progressed to unilateral reach, open chain with mod faciltation for postural control and reach patterns at mid and beginning high reach.  Also practiced simulated bathing using LUE as well as two handed drinking from a cup with water in it and strategies to improve LUE control.               OT Education - 07/19/18 1031    Education Details  functional use of LUE in bathing and two handed drinking from a cup    Person(s) Educated  Patient    Methods  Explanation;Demonstration;Handout    Comprehension  Verbalized understanding;Returned demonstration       OT Short Term Goals -  07/08/18 0947      OT SHORT TERM GOAL #1   Title  Pt independent with LUE HEP for neuro re-education - 07/03/18    Time  4    Period  Weeks    Status  Achieved      OT SHORT TERM GOAL #2   Title  Pt independent with coordination and putty HEP Lt hand    Time  4    Period  Weeks    Status  Achieved      OT SHORT TERM GOAL #3   Title  Pt to verbalize understanding with safety considerations LUE d/t lack of sensation    Time  4    Period  Weeks    Status  Achieved      OT SHORT TERM GOAL #4   Title  Pt to cut food mod I level with A/E prn    Time  4    Period  Weeks    Status  Achieved   IN CLINIC     OT SHORT TERM GOAL #5   Title  Pt to consistently don/doff shirt I'ly and don/doff socks I'ly w/ task modifications prn    Time  4    Period  Weeks    Status  Achieved   IN CLINIC     OT SHORT TERM GOAL #6   Title  Pt to verbalize understanding and consistently demo correct reaching pattern LUE to prevent pain Lt  shoulder    Time  4    Period  Weeks    Status  On-going      OT SHORT TERM GOAL #7   Title  Pt to improve coordination Lt hand as evidenced by performing 9 hole peg test in under 2 min.     Baseline  eval - placed 5 pegs in 2 min.    Time  4    Period  Weeks    Status  Not Met        OT Long Term Goals - 06/02/18 1144      OT LONG TERM GOAL #1   Title  Pt to perform functional mid to high level reaching LUE w/ only min compensations and no pain - 08/03/18    Time  8    Period  Weeks    Status  New      OT LONG TERM GOAL #2   Title  Pt to improve Lt hand coordination as evidenced by performing 9 hole peg test in 90 sec. or under    Time  8    Period  Weeks    Status  New      OT LONG TERM GOAL #3   Title  Pt to improve grip strength Lt hand to 40 lbs or greater to assist w/ opening jars/containers    Baseline  eval: 20 lbs    Time  8    Period  Weeks    Status  New      OT LONG TERM GOAL #4   Title  Pt to demo full composite flexion Lt hand for coordination/grasping tasks    Baseline  eval: 75%    Time  8    Period  Weeks    Status  New      OT LONG TERM GOAL #5   Title  Pt to be able to tie shoe laces and hook/unhook buttons using Lt hand as assist    Time  8    Period  Weeks    Status  New      Long Term Additional Goals   Additional Long Term Goals  Yes      OT LONG TERM GOAL #6   Title  Pt to return to simple cooking and cleaning tasks from standing level w/o LOB using countertop support prn    Time  8    Period  Weeks    Status  New      OT LONG TERM GOAL #7   Title  Pt to return to financial management tasks w/ supervision prn    Time  8    Period  Weeks    Status  New            Plan - 07/19/18 1031    Clinical Impression Statement  Pt progressing toward goals. Pt's performance improves with repetition and use of functional tasks.     Occupational Profile and client history currently impacting functional performance  PMH; HTN, HLD, DM,  A-fib. Current deficits impeding pt's ability to perform ADLS, IADLS, and role as husband, friend, and leisure participation    Occupational performance deficits (Please refer to evaluation for details):  ADL's;IADL's;Leisure    Rehab Potential  Good    OT Frequency  2x / week    OT Duration  8 weeks    OT Treatment/Interventions  Self-care/ADL training;Moist Heat;DME and/or AE instruction;Splinting;Therapeutic activities;Psychosocial skills training;Aquatic Therapy;Therapeutic exercise;Cognitive remediation/compensation;Coping strategies training;Neuromuscular education;Functional Mobility Training;Passive range of motion;Visual/perceptual remediation/compensation;Manual Therapy;Patient/family education;Electrical Stimulation    Plan  Continue NMR and functional reaching LUE, UBE    Consulted and Agree with Plan of Care  Patient    Family Member Consulted  friend       Patient will benefit from skilled therapeutic intervention in order to improve the following deficits and impairments:  Decreased coordination, Decreased range of motion, Difficulty walking, Improper body mechanics, Decreased safety awareness, Impaired sensation, Impaired tone, Decreased activity tolerance, Decreased knowledge of precautions, Impaired UE functional use, Pain, Decreased knowledge of use of DME, Decreased balance, Decreased cognition, Decreased mobility, Decreased strength  Visit Diagnosis: Muscle weakness (generalized)  Unsteadiness on feet  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 Spastic hemiplegia affecting nondominant side ([place]) 07/07/2018  . Abnormality of gait 06/09/2018  . Neuropathic pain   . Labile blood pressure   . PAF (paroxysmal atrial fibrillation) ([place])   . Hemiparesis  affecting left side as late effect of stroke ([place])   . Thalamic hemorrhage ([place]) 03/17/2018  . Benign essential HTN   . Dyslipidemia   . Hemorrhagic stroke ([place])   . ICH (intracerebral hemorrhage) ([place]) 03/12/2018  . Prostate cancer ([place]) 01/29/2017  . 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 Diabetes ([place]) 12/14/2015  . PCP NOTES >>>>>>>>>>>>>>>>>>>>>>>>>>>>>>. 08/06/2015  . Dizziness and giddiness 01/29/2015  . Umbilical hernia 19/50/9326  . Elevated PSA, less than 10 ng/ml 04/19/2012  . Annual physical exam 01/24/2011  . Hyperlipidemia 01/03/2010  . Essential hypertension 09/20/2007    Quay Burow, OTR/L 07/19/2018, 10:33 AM  [facility] [address] [place], [place], [address] Phone: [phone]   Fax:  [phone]  Name: Marcus Beasley MRN: [id number] Date of Birth: [birth year]

## 2018-07-20 ENCOUNTER — Telehealth: Payer: Self-pay | Admitting: Adult Health

## 2018-07-20 ENCOUNTER — Encounter: Payer: Self-pay | Admitting: Adult Health

## 2018-07-20 ENCOUNTER — Ambulatory Visit: Payer: Medicare Other | Admitting: Adult Health

## 2018-07-20 VITALS — BP 144/90 | HR 81 | Ht 71.0 in | Wt 174.0 lb

## 2018-07-20 DIAGNOSIS — G8114 Spastic hemiplegia affecting left nondominant side: Secondary | ICD-10-CM

## 2018-07-20 DIAGNOSIS — E785 Hyperlipidemia, unspecified: Secondary | ICD-10-CM

## 2018-07-20 DIAGNOSIS — I619 Nontraumatic intracerebral hemorrhage, unspecified: Secondary | ICD-10-CM

## 2018-07-20 DIAGNOSIS — I610 Nontraumatic intracerebral hemorrhage in hemisphere, subcortical: Secondary | ICD-10-CM

## 2018-07-20 DIAGNOSIS — I10 Essential (primary) hypertension: Secondary | ICD-10-CM | POA: Diagnosis not present

## 2018-07-20 DIAGNOSIS — E119 Type 2 diabetes mellitus without complications: Secondary | ICD-10-CM

## 2018-07-20 MED ORDER — TIZANIDINE HCL 2 MG PO CAPS: ORAL_CAPSULE | ORAL | 3 refills | Status: DC

## 2018-07-20 MED ORDER — TIZANIDINE HCL 2 MG PO TABS: ORAL_TABLET | ORAL | 3 refills | Status: DC

## 2018-07-20 NOTE — Progress Notes (Signed)
[facility] [address] [place]. [place] [address] [phone]       OFFICE FOLLOW UP NOTE  [name] Date of Birth:  1946-04-16 Medical Record Number:  [id number]   Reason for Referral:  hospital stroke follow up  CHIEF COMPLAINT:  Chief Complaint  Patient presents with  . Follow-up    2 month follow up. Wife and granddaughter prseent. Rm 9. Patient mentioned that he is still having some stiffness to his left side.     HPI: 07/20/18 Marcus Beasley returns today for stroke follow-up visit and is accompanied by his wife and granddaughter.  He continues to participate in outpatient PT/OT with needing further training in strength, balance and gait difficulties for improved functional mobility and independence due to continued left hemiparesis with spasticity.  He continues to follow with Dr. Posey Pronto at physical medicine and rehabilitation and due to continued paresthesias increase gabapentin dosage and baclofen.  He does continue to experience left-sided weakness with heaviness sensation and spasticity with mild improvement.  He denies any paresthesia symptoms pain or burning.  He is currently ambulating with a rolling walker without any recent falls.  CT head repeated and showed resolution of prior hemorrhage but unfortunately was not restarted on anticoagulation (imaging resulted by different provider as I was on vacation and was not aware of possibly restarting anticoagulation).  He continues on aspirin at this time without side effects of bleeding or bruising.  Continues on atorvastatin without side effects of myalgias.  Blood pressure today 144/90.  Granddaughter and wife are concerned regarding since increasing gabapentin and baclofen dose, they have noticed a darkening of his urine despite drinking approximately 64 ounces of water per day and voiding frequently.  They also endorse over the past week, he has had an increase of his glucose levels.  Denies any painful  urination, fevers or foul odor from urine or any difficulty initiating stream.  Denies new or worsening stroke/TIA symptoms.     History summary: Marcus Beasley is a 73 year old male with PMH of HTN, HLD, DM, AF and BPH who is being followed in this office after right thalamic ICH on 03/12/2018 likely secondary to HTN with presentation of facial numbness, slurred speech, leg weakness and facial droop.  MRA negative for aneurysm or AVM.  2D echo normal.  Initiated aspirin without recommending AC for AF for possible consideration of starting in the future.  Initiated atorvastatin 40 mg daily for HTN management.  Discharged to CIR for continued therapies and was discharged home on 04/14/2018.  All imaging in detail below and personally reviewed. Marcus Beasley initially presented to the office on 05/17/2018 with continued deficits of left hemiparesis and left paresthesias.  He had completed home therapies at that time and requesting initiating outpatient therapies for continued deficits.  He continued on all recommended medications including aspirin and statin without reported side effects.  Blood pressure continue to be elevated and initiated amlodipine 2.5 mg daily.     ROS:   14 system review of systems performed and negative with exception of unexpected weight change, restless leg, frequent urination, urgency, testicular pain, numbness and weakness  PMH:  Past Medical History:  Diagnosis Date  . BPH (benign prostatic hyperplasia)    (-) Bx 2015  . Diabetes mellitus without complication ([place])   . Elevated PSA    Prostate Bx in 06/2013 was benign  . HTN (hypertension)   . Hyperlipidemia   . Macular degeneration, age related  PSH:  Past Surgical History:  Procedure Laterality Date  . ABCESS DRAINAGE     abdomen- 26 day hospitalization 1968  . COLONOSCOPY  2016  . HERNIA REPAIR  summer '11   umbilical, dr Ninfa Linden   . POLYPECTOMY    . PROSTATE BIOPSY  06-2013 , 07-2017   (-), (-)     Social History:  Social History   Socioeconomic History  . Marital status: Married    Spouse name: Not on file  . Number of children: 3  . Years of education: 33  . Highest education level: Not on file  Occupational History  . 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 Financial resource strain: Not on file  . Food insecurity:    Worry: Not on file    Inability: Not on file  . Transportation needs:    Medical: Not on file    Non-medical: Not on file  Tobacco Use  . Smoking status: Never Smoker  . Smokeless tobacco: Never Used  Substance and Sexual Activity  . Alcohol use: Yes    Comment: occ. beer  . Drug use: No  . Sexual activity: Yes    Partners: Female  Lifestyle  . Physical activity:    Days per week: Not on file    Minutes per session: Not on file  . Stress: Not on file  Relationships  . Social connections:    Talks on phone: Not on file    Gets together: Not on file    Attends religious service: Not on file    Active member of club or organization: Not on file    Attends meetings of clubs or organizations: Not on file    Relationship status: Not on file  . Intimate partner violence:    Fear of current or ex partner: Not on file    Emotionally abused: Not on file    Physically abused: Not on file    Forced sexual activity: Not on file  Other Topics Concern  . 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 2 dtrs , 1 son - homicide. 5 grandchildren.      Family History:  Family History  Problem Relation Age of Onset  . Leukemia Mother   . Heart attack Father        MI age 59  . Heart disease Sister        age 62  . Cancer - Other Sister        type  . Heart disease Brother        age 65  . Kidney disease Brother        HD  . Diabetes Maternal Grandmother   . Prostate cancer Neg Hx   . Colon cancer Neg Hx   . Esophageal cancer Neg Hx   . Rectal cancer  Neg Hx   . Stomach cancer Neg Hx     Medications:   Current Outpatient Medications on File Prior to Visit  Medication Sig Dispense Refill  . amLODipine (NORVASC) 2.5 MG tablet Take 1 tablet (2.5 mg total) by mouth daily. 30 tablet 2  . aspirin 81 MG tablet Take 1 tablet (81 mg total) by mouth daily. 30 tablet 0  . atorvastatin (LIPITOR) 20 MG tablet Take 1 tablet (20 mg total) by mouth daily. 90 tablet 1  . baclofen (LIORESAL) 20 MG tablet Take 1 tablet (20 mg total)  by mouth 3 (three) times daily. 90 each 1  . diltiazem (CARDIZEM CD) 240 MG 24 hr capsule Take 1 capsule (240 mg total) by mouth daily. 90 capsule 1  . gabapentin (NEURONTIN) 600 MG tablet Take 1.5 tablets (900 mg total) by mouth 3 (three) times daily for 30 days. 135 tablet 1  . meclizine (ANTIVERT) 25 MG tablet Take 1 tablet (25 mg total) by mouth 2 (two) times daily as needed for dizziness. 30 tablet 1  . metFORMIN (GLUCOPHAGE) 850 MG tablet Take 1 tablet (850 mg total) by mouth 2 (two) times daily with a meal. 180 tablet 1  . metoprolol tartrate (LOPRESSOR) 50 MG tablet TAKE 1 TABLET BY MOUTH TWICE A DAY 180 tablet 1  . Multiple Vitamin (MULTIVITAMIN WITH MINERALS) TABS tablet Take 1 tablet by mouth daily. Centrum    . Multiple Vitamins-Minerals (PRESERVISION/LUTEIN PO) Take by mouth.     No current facility-administered medications on file prior to visit.     Allergies:  No Known Allergies   Physical Exam  Vitals:   07/20/18 0922  BP: (!) 144/90  Pulse: 81  Weight: 174 lb (78.9 kg)  Height: 5\' 11"  (1.803 m)   Body mass index is 24.27 kg/m. No exam data present  General: well developed, well nourished, pleasant elderly African-American male, seated, in no evident distress Head: head normocephalic and atraumatic.   Neck: supple with no carotid or supraclavicular bruits Cardiovascular: regular rate and rhythm, no murmurs Musculoskeletal: no deformity Skin:  no rash/petichiae Vascular:  Normal pulses all  extremities  Neurologic Exam Mental Status: Awake and fully alert. Oriented to place and time. Recent and remote memory intact. Attention span, concentration and fund of knowledge appropriate. Mood and affect appropriate.  Cranial Nerves: Fundoscopic exam reveals sharp disc margins. Pupils equal, briskly reactive to light. Extraocular movements full without nystagmus. Visual fields full to confrontation. Hearing intact. Facial sensation intact.  Motor: Normal bulk and tone. LUE: 4+/5 with weak grip strength due to spasticity; LLE: 4+/5 greater in hip flexor; full strength right upper and lower extremity Sensory.:  Mildly decreased sensation left upper and lower extremity compared to right upper and lower extremity Coordination: Ataxia present left upper extremity.  Decreased left finger dexterity.  Orbits right arm over left arm. Gait and Station: Ambulates with a rolling walker and is able to stand independently.  Stance is normal.  He takes small, shuffled steps but is able to ambulate independently without balance difficulties Reflexes: 1+ and symmetric. Toes downgoing.      Diagnostic Data (Labs, Imaging, Testing)  CT HEAD WO CONTRAST 05/26/2018 IMPRESSION: Abnormal CT scan of the head showing expected evolutionary changes in the right subcortical hemorrhage which is now mostly hypodense.  Mild changes of age-appropriate chronic microvascular ischemia.  Compared to CT head dated 03/12/2018 expected evolutionary changes are noted    Hospital imaging: CT HEAD WO CONTRAST 03/12/2018 IMPRESSION: 4 cc acute hematoma in the right thalamus and corona radiata, often hypertensive in this location.  MR BRAIN WO CONTRAST MR MRA HEAD  03/13/2018 IMPRESSION: Approximate 5 mL RIGHT thalamic hemorrhage, mild surrounding edema. No abnormal postcontrast enhancement or diffusion abnormality outside of the area of the bleed. Mild surrounding edema without midline shift. 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  ECHOCARDIOGRAM 03/13/2018 Study Conclusions - Left ventricle: The cavity  size was normal. Wall thickness was   increased in a pattern of mild LVH. Systolic function was   vigorous. The estimated ejection fraction was in the range of 65%   to 70%. Wall motion was normal; there were no regional wall   motion abnormalities. - Aortic valve: There was trivial regurgitation.    ASSESSMENT: Marcus Beasley is a 73 y.o. 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 Vascular risk factors include HTN, HLD and DM.  He is being seen today for follow-up visit with continued deficits of spastic left hemiparesis.    PLAN:  1. Right thalamic and corona radiate ICH: Continue aspirin 81 mg daily  and atorvastatin for secondary stroke prevention.  We will reach out to cardiologist in regards to safety of resuming Eliquis from a stroke standpoint for atrial fibrillation and secondary stroke prevention.  Patient does have appointment scheduled on 07/27/2018 with cardiology.  Maintain strict control of hypertension with blood pressure goal below 130/90, diabetes with hemoglobin A1c goal below 6.5% and cholesterol with LDL cholesterol (bad cholesterol) goal below 70 mg/dL.  I also advised the patient to eat a healthy diet with plenty of whole grains, cereals, fruits and vegetables, exercise regularly with at least 30 minutes of continuous activity daily and maintain ideal body weight. 2. Spastic left hemiparesis: Continue therapies for continued improvement.  Initiated tizanidine 2 mg nightly for 7 days and then advised to increase to 4 mg nightly thereafter.  Advised patient that we are starting at a low dose to ensure tolerability and can  continue to follow with Dr. Posey Pronto for future management 3. PAF: Follow with cardiology on 07/27/2018 with possible restarting of Eliquis as this will be safe to do at this time from a stroke standpoint 4. HTN:  Advised to continue current dosages of diltiazem, metoprolol and amlodipine.  Today's BP 144/90.  Continue to follow with cardiologist/PCP for HTN management 5. HLD: Advised to continue current treatment regimen along with continued follow-up with PCP for future prescribing and monitoring of lipid panel.  Lipid panel will be obtained today as it does not appear as though this has been done since hospital discharge 6. DMII: Advised to continue to monitor glucose levels at home along with continued follow-up with PCP for management and monitoring 7. Dark urine: Advised to increase fluid intake and will obtain CMP to ensure adequate kidney function (to ensure not caused by gabapentin) and electrolytes.  Advised him to continue to follow with PCP if urine remains dark despite increase fluid intake and normale kidney function.    Follow up in 3 months or call earlier if needed   Greater than 50% of time during this 25 minute visit was spent on counseling, explanation of diagnosis of right thalamic and corona radiata ICH, reviewing risk factor management of PAF, HTN, HLD and DM, planning of further management along with potential future management, and discussion with patient and family answering all questions.    Marcus Beasley, AGNP-BC  [facility] [address] [place], [place] [address]  Phone [phone] Fax [phone] Note: This document was prepared with digital dictation and possible smart phrase technology. Any transcriptional errors that result from this process are unintentional.

## 2018-07-20 NOTE — Telephone Encounter (Signed)
[facility] called stating that the pt was prescribed tizanidine (ZANAFLEX) 2 MG capsule and the capsule is needing a PA called for at [phone] or if the medication can be changed to a tablet it can just be resent to the pharmacy so that the pt can get it refilled. [employer] requested pt be called with either decision. Please advice.

## 2018-07-20 NOTE — Progress Notes (Signed)
I agree with the above plan 

## 2018-07-20 NOTE — Telephone Encounter (Signed)
I called pt, spoke to pt's wife Pamala Hurry, per DPR, and advised her that pt's zanaflex has been changed to a tablet, instead of a capsule, and that pt should start the medication tonight. Pt's wife verbalized understanding.

## 2018-07-20 NOTE — Patient Instructions (Signed)
Continue aspirin 81 mg daily  and lipitor  for secondary stroke prevention  Start Zanaflex 2mg  nightly for 7 days and then increase to 4mg  (2 tabs) nightly  Follow up with Dr. Posey Pronto for continued spasticity and weakness  Continue to follow up with PCP regarding cholesterol, blood pressure and diabetes management   We will check your cholesterol levels today along with your kidney function and electrolytes - if your urine continues to be dark despite increasing fluid intake, please follow up with PCP  Follow up with PCP regarding increased ear wax in right ear  Continue to monitor blood pressure at home  Maintain strict control of hypertension with blood pressure goal below 130/90, diabetes with hemoglobin A1c goal below 6.5% and cholesterol with LDL cholesterol (bad cholesterol) goal below 70 mg/dL. I also advised the patient to eat a healthy diet with plenty of whole grains, cereals, fruits and vegetables, exercise regularly and maintain ideal body weight.  Followup in the future with me in 3 months or call earlier if needed       Thank you for coming to see [place] at [facility]. I hope we have been able to provide you high quality care today.  You may receive a patient satisfaction survey over the next few weeks. We would appreciate your feedback and comments so that we may continue to improve ourselves and the health of our patients.

## 2018-07-20 NOTE — Telephone Encounter (Signed)
Marcus Beasley from [facility] called to see if the paperwork that was faxed over for the pt was received. Please advise.

## 2018-07-21 ENCOUNTER — Ambulatory Visit: Payer: Medicare Other | Admitting: Occupational Therapy

## 2018-07-21 ENCOUNTER — Encounter: Payer: Self-pay | Admitting: Physical Therapy

## 2018-07-21 ENCOUNTER — Telehealth: Payer: Self-pay

## 2018-07-21 ENCOUNTER — Ambulatory Visit: Payer: Medicare Other | Admitting: Physical Therapy

## 2018-07-21 DIAGNOSIS — R2681 Unsteadiness on feet: Secondary | ICD-10-CM

## 2018-07-21 DIAGNOSIS — M6281 Muscle weakness (generalized): Secondary | ICD-10-CM

## 2018-07-21 DIAGNOSIS — I69254 Hemiplegia and hemiparesis following other nontraumatic intracranial hemorrhage affecting left non-dominant side: Secondary | ICD-10-CM

## 2018-07-21 DIAGNOSIS — R2689 Other abnormalities of gait and mobility: Secondary | ICD-10-CM

## 2018-07-21 DIAGNOSIS — R208 Other disturbances of skin sensation: Secondary | ICD-10-CM

## 2018-07-21 DIAGNOSIS — R278 Other lack of coordination: Secondary | ICD-10-CM | POA: Diagnosis not present

## 2018-07-21 LAB — COMPREHENSIVE METABOLIC PANEL
ALT: 326 IU/L — ABNORMAL HIGH (ref 0–44)
AST: 59 IU/L — ABNORMAL HIGH (ref 0–40)
Albumin/Globulin Ratio: 1.6 (ref 1.2–2.2)
Albumin: 4.2 g/dL (ref 3.7–4.7)
Alkaline Phosphatase: 136 IU/L — ABNORMAL HIGH (ref 39–117)
BUN/Creatinine Ratio: 20 (ref 10–24)
BUN: 16 mg/dL (ref 8–27)
Bilirubin Total: 0.6 mg/dL (ref 0.0–1.2)
CO2: 21 mmol/L (ref 20–29)
Calcium: 9.7 mg/dL (ref 8.6–10.2)
Chloride: 107 mmol/L — ABNORMAL HIGH (ref 96–106)
Creatinine, Ser: 0.81 mg/dL (ref 0.76–1.27)
GFR calc Af Amer: 102 mL/min/{1.73_m2} (ref >59)
GFR calc non Af Amer: 88 mL/min/{1.73_m2} (ref >59)
Globulin, Total: 2.7 g/dL (ref 1.5–4.5)
Glucose: 89 mg/dL (ref 65–99)
Potassium: 4.7 mmol/L (ref 3.5–5.2)
Sodium: 145 mmol/L — ABNORMAL HIGH (ref 134–144)
Total Protein: 6.9 g/dL (ref 6.0–8.5)

## 2018-07-21 LAB — LIPID PANEL
Chol/HDL Ratio: 3.6 ratio (ref 0.0–5.0)
Cholesterol, Total: 154 mg/dL (ref 100–199)
HDL: 43 mg/dL (ref >39)
LDL Calculated: 91 mg/dL (ref 0–99)
Triglycerides: 101 mg/dL (ref 0–149)
VLDL Cholesterol Cal: 20 mg/dL (ref 5–40)

## 2018-07-21 NOTE — Telephone Encounter (Signed)
Paperwork has been signed by NP and faxed back to [facility] with attention to [place].   [place], [place] [address] Website: [email or web address] Tel: [phone] Fax: [phone]  Confirmation fax has been received.

## 2018-07-21 NOTE — Patient Instructions (Addendum)
HIP: Hamstrings - Short Sitting    Rest leg on raised surface or the floor. Keep knee straight. Lift chest and stay sitting tall until you feel a gentle stretch. Hold _30_ seconds. __3_ reps per set, __2_ sets per day.  Copyright  VHI. All rights reserved.  ANKLE: Dorsiflexion - Sitting    Sitting, place strap around foot. Pull foot toward body, keeping heel on floor. Keep foot straight. You should feel a gentle stretch in your lower leg.  Hold _30__ seconds. _3__ reps per set, _2__ sets per day.  Copyright  VHI. All rights reserved.

## 2018-07-21 NOTE — Telephone Encounter (Signed)
I called patients wife Pamala Hurry about her husband lab work. I ask wife to write down the recommendations from [place] NP. I stated pt has elevated liver enzymes and labs needed to be repeated in one to weeks by his primary doctor. No need to increase any statin medications at this time. ALso please call primary doctor about their urine concerns. The wife stated her husband has an appt on Monday with Dr. Larose Kells. The wife verbalized understanding, and knows to make the PCP aware. I stated a copy of the labs were sent and will be forward to Dr. Larose Kells. ------

## 2018-07-21 NOTE — Therapy (Signed)
[facility] [address] [place], [place], [address] Phone: [phone]   Fax:  [phone]  Occupational Therapy Treatment  Patient Details  Name: Marcus Beasley MRN: [id number] Date of Birth: 07-30-1945 Referring Provider (OT): Venancio Poisson   Encounter Date: 07/21/2018  OT End of Session - 07/21/18 0929    Visit Number  12    Number of Visits  17    Date for OT Re-Evaluation  08/03/18    Authorization Type  UHC MCR    Authorization - Visit Number  12    Authorization - Number of Visits  20    OT Start Time  0850    OT Stop Time  0930    OT Time Calculation (min)  40 min    Activity Tolerance  Patient tolerated treatment well    Behavior During Therapy  [facility] for tasks assessed/performed       Past Medical History:  Diagnosis Date  . BPH (benign prostatic hyperplasia)    (-) Bx 2015  . Diabetes mellitus without complication ([place])   . Elevated PSA    Prostate Bx in 06/2013 was benign  . HTN (hypertension)   . Hyperlipidemia   . Macular degeneration, age related     Past Surgical History:  Procedure Laterality Date  . ABCESS DRAINAGE     abdomen- 26 day hospitalization 1968  . COLONOSCOPY  2016  . HERNIA REPAIR  summer '11   umbilical, dr Ninfa Linden   . POLYPECTOMY    . PROSTATE BIOPSY  06-2013 , 07-2017   (-), (-)    There were no vitals filed for this visit.  Subjective Assessment - 07/21/18 0857    Subjective   My fingers are a little stiff but no true pain    Patient is accompained by:  Family member   granddaughter   Pertinent History  Rt thalamic hemorrhage 03/12/18. PMH: HTN, HLD, DM, A-fib    Limitations  Fall risk    Patient Stated Goals  get more function in my Lt arm and hand    Currently in Pain?  No/denies       SELF CARE: Pt practiced donning/doffing shirt w/ cues for proper technique but able to do I'ly w/ cueing. Pt practiced doffing socks mod I level. Pt donned socks w/ cues  for one handed technique and stool for Lt LE. Pt encouraged to use Lt hand for all ADLS, however unable to assist w/ sock d/t decreased grip/pinch strength Lt hand. Also reviewed safety techniques and things to avoid LUE (NOTHING sharp, heavy, breakable, or hot). Pt provided extra strapping for walker splint  NMR: wt bearing over Lt elbow activating shoulder girdle to come partially up, then fully up onto extended arm. AA/ROM LUE using UE Ranger w/ cues to push with hand in mid to high range sh flexion. BUE sh flexion in low to mid ranges holding ball. Supine: high level BUE sh flexion while holding ball for control - min cues to keep elbow extended and for control  UBE x 5 min. For reciprocal movement pattern (Lt hand wrapped).                       OT Short Term Goals - 07/08/18 0947      OT SHORT TERM GOAL #1   Title  Pt independent with LUE HEP for neuro re-education - 07/03/18    Time  4    Period  [place]  Status  Achieved      OT SHORT TERM GOAL #2   Title  Pt independent with coordination and putty HEP Lt hand    Time  4    Period  Weeks    Status  Achieved      OT SHORT TERM GOAL #3   Title  Pt to verbalize understanding with safety considerations LUE d/t lack of sensation    Time  4    Period  Weeks    Status  Achieved      OT SHORT TERM GOAL #4   Title  Pt to cut food mod I level with A/E prn    Time  4    Period  Weeks    Status  Achieved   IN CLINIC     OT SHORT TERM GOAL #5   Title  Pt to consistently don/doff shirt I'ly and don/doff socks I'ly w/ task modifications prn    Time  4    Period  Weeks    Status  Achieved   IN CLINIC     OT SHORT TERM GOAL #6   Title  Pt to verbalize understanding and consistently demo correct reaching pattern LUE to prevent pain Lt shoulder    Time  4    Period  Weeks    Status  On-going      OT SHORT TERM GOAL #7   Title  Pt to improve coordination Lt hand as evidenced by performing 9 hole peg test in under  2 min.     Baseline  eval - placed 5 pegs in 2 min.    Time  4    Period  Weeks    Status  Not Met        OT Long Term Goals - 06/02/18 1144      OT LONG TERM GOAL #1   Title  Pt to perform functional mid to high level reaching LUE w/ only min compensations and no pain - 08/03/18    Time  8    Period  Weeks    Status  New      OT LONG TERM GOAL #2   Title  Pt to improve Lt hand coordination as evidenced by performing 9 hole peg test in 90 sec. or under    Time  8    Period  Weeks    Status  New      OT LONG TERM GOAL #3   Title  Pt to improve grip strength Lt hand to 40 lbs or greater to assist w/ opening jars/containers    Baseline  eval: 20 lbs    Time  8    Period  Weeks    Status  New      OT LONG TERM GOAL #4   Title  Pt to demo full composite flexion Lt hand for coordination/grasping tasks    Baseline  eval: 75%    Time  8    Period  Weeks    Status  New      OT LONG TERM GOAL #5   Title  Pt to be able to tie shoe laces and hook/unhook buttons using Lt hand as assist    Time  8    Period  Weeks    Status  New      Long Term Additional Goals   Additional Long Term Goals  Yes      OT LONG TERM GOAL #6   Title  Pt to  return to simple cooking and cleaning tasks from standing level w/o LOB using countertop support prn    Time  8    Period  Weeks    Status  New      OT LONG TERM GOAL #7   Title  Pt to return to financial management tasks w/ supervision prn    Time  8    Period  Weeks    Status  New            Plan - 07/21/18 6378    Clinical Impression Statement  Pt continues to show progress w/ LUE function in functional tasks w/ repetition. Pt also demo increased control and more attentive to LUE    Occupational Profile and client history currently impacting functional performance  PMH; HTN, HLD, DM, A-fib. Current deficits impeding pt's ability to perform ADLS, IADLS, and role as husband, friend, and leisure participation    Occupational  performance deficits (Please refer to evaluation for details):  ADL's;IADL's;Leisure    Rehab Potential  Good    OT Frequency  2x / week    OT Duration  8 weeks    OT Treatment/Interventions  Self-care/ADL training;Moist Heat;DME and/or AE instruction;Splinting;Therapeutic activities;Psychosocial skills training;Aquatic Therapy;Therapeutic exercise;Cognitive remediation/compensation;Coping strategies training;Neuromuscular education;Functional Mobility Training;Passive range of motion;Visual/perceptual remediation/compensation;Manual Therapy;Patient/family education;Electrical Stimulation    Plan  Functional reaching, work on finger ROM and emerging coordination as able    Consulted and Agree with Plan of Care  Patient;Family member/caregiver    Family Member Consulted  Granddaughter       Patient will benefit from skilled therapeutic intervention in order to improve the following deficits and impairments:  Decreased coordination, Decreased range of motion, Difficulty walking, Improper body mechanics, Decreased safety awareness, Impaired sensation, Impaired tone, Decreased activity tolerance, Decreased knowledge of precautions, Impaired UE functional use, Pain, Decreased knowledge of use of DME, Decreased balance, Decreased cognition, Decreased mobility, Decreased strength  Visit Diagnosis: 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 Spastic hemiplegia affecting nondominant side ([place]) 07/07/2018  . Abnormality of gait 06/09/2018  . Neuropathic pain   . Labile blood pressure   . PAF (paroxysmal atrial fibrillation) ([place])   . Hemiparesis affecting left side as late effect of stroke ([place])   . Thalamic hemorrhage ([place]) 03/17/2018  . Benign essential HTN    . Dyslipidemia   . Hemorrhagic stroke ([place])   . ICH (intracerebral hemorrhage) ([place]) 03/12/2018  . Prostate cancer ([place]) 01/29/2017  . 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 Diabetes ([place]) 12/14/2015  . PCP NOTES >>>>>>>>>>>>>>>>>>>>>>>>>>>>>>. 08/06/2015  . Dizziness and giddiness 01/29/2015  . Umbilical hernia 58/85/0277  . Elevated PSA, less than 10 ng/ml 04/19/2012  . Annual physical exam 01/24/2011  . Hyperlipidemia 01/03/2010  . Essential hypertension 09/20/2007    Carey Bullocks, OTR/L 07/21/2018, 11:31 AM  [facility] [address] [place], [place], [address] Phone: [phone]   Fax:  [phone]  Name: TRAYDEN BRANDY MRN: [id number] Date of Birth: [birth year]

## 2018-07-21 NOTE — Telephone Encounter (Signed)
-----   Message from Venancio Poisson, NP sent at 07/21/2018  9:57 AM EST ----- Please notify patient that lab work obtained after yesterday's appointment showed normal kidney function but did show mild elevation in liver function test.  It is recommended to follow-up with PCP in 1 to 2 weeks time for repeat labs and further work-up if needed along with further work-up of urine concerns.  Slight elevation of cholesterol panel but due to elevated liver enzymes, it is not recommended to increase dosage at this time.  Unlikely liver enzyme elevation due to statin as he has been on atorvastatin 20 mg prior with evidence of normal liver function.

## 2018-07-22 ENCOUNTER — Encounter: Payer: Self-pay | Admitting: Physical Therapy

## 2018-07-22 NOTE — Therapy (Signed)
[facility] [address] [place], [place], [address] Phone: [phone]   Fax:  [phone]  Physical Therapy Treatment  Patient Details  Name: Marcus Beasley MRN: [id number] Date of Birth: 08/21/1945 No data recorded  Encounter Date: 07/21/2018  PT End of Session - 07/22/18 2009    Visit Number  12    Number of Visits  17    Date for PT Re-Evaluation  08/31/18    Authorization Type  UHC Medicare; will need 10th visit progress notes    PT Start Time  0937    PT Stop Time  1016    PT Time Calculation (min)  39 min    Equipment Utilized During Treatment  Gait belt    Activity Tolerance  Patient tolerated treatment well    Behavior During Therapy  [facility] for tasks assessed/performed       Past Medical History:  Diagnosis Date  . BPH (benign prostatic hyperplasia)    (-) Bx 2015  . Diabetes mellitus without complication ([place])   . Elevated PSA    Prostate Bx in 06/2013 was benign  . HTN (hypertension)   . Hyperlipidemia   . Macular degeneration, age related     Past Surgical History:  Procedure Laterality Date  . ABCESS DRAINAGE     abdomen- 26 day hospitalization 1968  . COLONOSCOPY  2016  . HERNIA REPAIR  summer '11   umbilical, dr Ninfa Linden   . POLYPECTOMY    . PROSTATE BIOPSY  06-2013 , 07-2017   (-), (-)    There were no vitals filed for this visit.  Subjective Assessment - 07/22/18 1958    Subjective  Pt reports [facility] will be here on 08/05/2018 for brace delivery.    Patient is accompained by:  Family member   friend   Limitations  Walking;Standing    Patient Stated Goals  Pt wants to be able to walk again.  Wants to be able to use L hand and arm again; wants to drive, do yardwork.                       [facility] Adult PT Treatment/Exercise - 07/22/18 1959      Transfers   Transfers  Sit to Stand;Stand to [employer] to Stand  5: Supervision    Stand to Sit  5: Supervision;Without  upper extremity assist;To bed    Number of Reps  10 reps;2 sets   from mat surface, varied foot positions   Comments  Performed sit<>stand with equal foot positions, then with LLE posterior to RLE, for improved use of LLE for sit<>stand transfers.  Then transitioned to R foot positioned on foam cushion, with sit<>stand, increased reliance on LLE for stance in weightbearing, x 5 reps.  Cues for upright posture, hold x 5 seconds.  Fatigue noted in L knee during 3rd set.      Ambulation/Gait   Ambulation/Gait  Yes    Ambulation/Gait Assistance  5: Supervision    Ambulation Distance (Feet)  80 Feet   60   Assistive device  Rolling walker    Gait Comments  Pt walks into therapy today, with granddaughter, then PT provides supervision from restroom to mat at beginning of session.  Commended patient on walking into therapy today and requested pt continue to do that with family supervision.  Also discussed and recomnmended that pt walk from room to room using RW and family supervision (and  try not to use w/c in home for mobility as much).  Pt/granddaugther in agreement.      Neuro Re-ed    Neuro Re-ed Details   Tall kneeling exercises with blue therapy bench in front of patient, with bilateral UE support, to improve glut and quad activation and weightbearing through LLE:  tall kneeling<>half kneeling 2 sets x 5 reps, R UE lifts off of bench for decreased UE support 2 sets x 5 reps, then lifting RLE for LLE in stance, 2 sets x 5 reps.  Pt requires min assist to get onto and off of mat into tall kneel position.  Seated edge of mat, practiced scooting towards edge of mat, for improved pelvic mobility, pelvic dissociation, decreased posterior rotation of L pelvis.      Knee/Hip Exercises: Stretches   Active Hamstring Stretch  Left;3 reps;30 seconds    Gastroc Stretch  Left;3 reps;30 seconds    Gastroc Stretch Limitations  Using gait belt to aid in stretch    Other Knee/Hip Stretches  Instructed pt in and  added the above exercises for HEP to aid in flexibility for improved weightbearing/stance through LLE and improved flexibility awaiting arrival of pt's AFO.        Cues with gait for more consistent, continuous step through pattern with RW.     PT Education - 07/22/18 2008    Education Details  Added stretches to HEP; walk into and out of therapy sessions with family supervision and RW; walk at home room to room distances with RW and family supervision    Person(s) Educated  Patient;Other (comment)   granddaughter   Methods  Explanation;Demonstration;Handout    Comprehension  Verbalized understanding;Returned demonstration;Verbal cues required       PT Short Term Goals - 07/08/18 0950      PT SHORT TERM GOAL #1   Title  Pt will be independent with HEP for improved strength, balance, gait.  TARGET 07/02/18 (may be modified due to scheduling around holidays)    Time  4    Period  Weeks    Status  Achieved      PT SHORT TERM GOAL #2   Title  Pt will perform at least 8 of 10 reps of sit<>stand transfers independently, minimal to no UE support, for improved lower extremity strengthening and transfer efficiency.    Time  4    Period  Weeks    Status  Achieved      PT SHORT TERM GOAL #3   Title  Pt will improve Berg Balance score to at least 27/56 for decreased fall risk.    Baseline  34/56 07/05/18    Time  4    Period  Weeks    Status  Achieved      PT SHORT TERM GOAL #4   Title  Pt will improve TUG score to less than or equal to 25 seconds for decreased fall risk.    Baseline  30.66 sec, 28.84 sec with cane    Time  4    Period  Weeks    Status  Not Met      PT SHORT TERM GOAL #5   Title  Pt will verbalize understanding of fall prevention in home environment.    Time  4    Period  Weeks    Status  Achieved        PT Long Term Goals - 07/15/18 1036      PT LONG TERM GOAL #1  Title  Pt will verbalize plans for continued community fitness upon d/c from PT.  TARGET  07/30/18    Time  8    Period  Weeks    Status  On-going      PT LONG TERM GOAL #2   Title  Pt will improve gait velocity to at least 1.8 ft/sec for improved gait efficiency and safety.    Time  8    Period  Weeks    Status  On-going      PT LONG TERM GOAL #3   Title  Pt will improve Berg score to at least 40/56 for decreased fall risk.    Time  8    Period  Weeks    Status  Revised      PT LONG TERM GOAL #4   Title  Pt will improve TUG score to less than or equal to 20 seconds for decreased fall risk.    Time  8    Period  Weeks    Status  Revised      PT LONG TERM GOAL #5   Title  Pt will ambulate at least 150 ft using least restrictive assistive device, modified independently, for improved independence with gait.    Time  8    Period  Weeks    Status  On-going      PT LONG TERM GOAL #6   Title  Pt will negotiate at least 12 steps, one handrail, step-through pattern, modified independently, for improved stair negotiation in home.    Time  8    Period  Weeks    Status  On-going            Plan - 07/22/18 2010    Clinical Impression Statement  Skilled PT session focused on neuro-reeducation to LLE with sit<>stand varied LLE positioning and with seated scooting and tall kneeling activities.  Pt needs UE support in tall kneeling, but is able to lessen UE support, needs tactile cues through L gluts and L quads to activate and stay upright through L side in tall kneeling.  Pt will conitnue to benefit from skilled PT ot address strength, balance and progression to more indepedent gait.    Rehab Potential  Good    Clinical Impairments Affecting Rehab Potential  good family support; independnet prior to CVA    PT Frequency  2x / week    PT Duration  8 weeks   plus eval   PT Treatment/Interventions  ADLs/Self Care Home Management;Electrical Stimulation;Therapeutic exercise;Therapeutic activities;Functional mobility training;Gait training;Stair training;DME Instruction;Balance  training;Neuromuscular re-education;Patient/family education;Orthotic Fit/Training;Manual techniques    PT Next Visit Plan  Tall kneeling>1/2 kneeling as able; pelvic mobility (on therapy ball?), standing exercises for balance LLE as stance/LLE strengthening; gait training    Consulted and Agree with Plan of Care  Patient;Family member/caregiver    Family Member Consulted  friend       Patient will benefit from skilled therapeutic intervention in order to improve the following deficits and impairments:  Abnormal gait, Decreased balance, Decreased mobility, Difficulty walking, Decreased strength, Impaired tone, Postural dysfunction  Visit Diagnosis: Muscle weakness (generalized)  Other abnormalities of gait and mobility     Problem List Patient Active Problem List   Diagnosis Date Noted  . Spastic hemiplegia affecting nondominant side ([place]) 07/07/2018  . Abnormality of gait 06/09/2018  . Neuropathic pain   . Labile blood pressure   . PAF (paroxysmal atrial fibrillation) ([place])   . Hemiparesis affecting left side as late  effect of stroke ([place])   . Thalamic hemorrhage ([place]) 03/17/2018  . Benign essential HTN   . Dyslipidemia   . Hemorrhagic stroke ([place])   . ICH (intracerebral hemorrhage) ([place]) 03/12/2018  . Prostate cancer ([place]) 01/29/2017  . 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 Diabetes ([place]) 12/14/2015  . PCP NOTES >>>>>>>>>>>>>>>>>>>>>>>>>>>>>>. 08/06/2015  . Dizziness and giddiness 01/29/2015  . Umbilical hernia 88/33/7445  . Elevated PSA, less than 10 ng/ml 04/19/2012  . Annual physical exam 01/24/2011  . Hyperlipidemia 01/03/2010  . Essential hypertension 09/20/2007    Nohealani Medinger W. 07/22/2018, 8:13 PM  Frazier Butt., PT   [facility] [address] [place], [place], [address] Phone: [phone]   Fax:  [phone]  Name: Marcus Beasley MRN: [id number] Date of Birth:  Feb 28, 1946

## 2018-07-25 NOTE — Progress Notes (Signed)
Cardiology Office Note    Date:  07/27/2018   ID:  Marcus Beasley, Marcus Beasley Jul 23, 1945, MRN [id number]  PCP:  Colon Branch, MD  Cardiologist:  Dr. Martinique  Chief Complaint  Patient presents with  . Atrial Fibrillation    History of Present Illness:  Marcus Beasley is a 73 y.o. male seen for follow up Afib, HTN and prior hemorrhagic CVA. He has a  past medical history of hypertension, DM 2 and hyperlipidemia who presented on 03/12/2018 was facial droop, slurred speech and left leg weakness.  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  This was attributed to uncontrolled high blood pressure.  During the admission, patient developed paroxysmal atrial fibrillation and was placed on 180 mg daily of diltiazem. [place] Vasc score of 5. He was not placed on systemic anticoagulation due to the brain bleed.  Patient was eventually discharged to inpatient rehab and was  released from inpatient rehab on 04/14/2018. More recent note from Neurology noted that CT showed resolution of hemorrhage and that it would be safe to resume anticoagulation.   On follow up today he still notes numbness on his left side and has stiffness in his left leg and arm. Unable to make a fist. Still getting PT. No chest pain, palpitations, dizziness, edema, SOB. No bleeding.   Past Medical History:  Diagnosis Date  . BPH (benign prostatic hyperplasia)    (-) Bx 2015  . Diabetes mellitus without complication ([place])   . Elevated PSA    Prostate Bx in 06/2013 was benign  . HTN (hypertension)   . Hyperlipidemia   . Macular degeneration, age related     Past Surgical History:  Procedure Laterality Date  . ABCESS DRAINAGE     abdomen- 26 day hospitalization 1968  . COLONOSCOPY  2016  . HERNIA REPAIR  summer '11   umbilical, dr Ninfa Linden   . POLYPECTOMY    . PROSTATE BIOPSY  06-2013 , 07-2017   (-), (-)    Current Medications: Outpatient Medications Prior to Visit  Medication Sig Dispense Refill  .  amLODipine (NORVASC) 2.5 MG tablet Take 1 tablet (2.5 mg total) by mouth daily. 30 tablet 2  . baclofen (LIORESAL) 20 MG tablet Take 1 tablet (20 mg total) by mouth 3 (three) times daily. 90 each 1  . diltiazem (CARDIZEM CD) 240 MG 24 hr capsule Take 1 capsule (240 mg total) by mouth daily. 90 capsule 1  . gabapentin (NEURONTIN) 600 MG tablet Take 1.5 tablets (900 mg total) by mouth 3 (three) times daily for 30 days. 135 tablet 1  . meclizine (ANTIVERT) 25 MG tablet Take 1 tablet (25 mg total) by mouth 2 (two) times daily as needed for dizziness. 30 tablet 1  . metFORMIN (GLUCOPHAGE) 850 MG tablet Take 1 tablet (850 mg total) by mouth 2 (two) times daily with a meal. 180 tablet 1  . metoprolol tartrate (LOPRESSOR) 50 MG tablet TAKE 1 TABLET BY MOUTH TWICE A DAY 180 tablet 1  . Multiple Vitamin (MULTIVITAMIN WITH MINERALS) TABS tablet Take 1 tablet by mouth daily. Centrum    . Multiple Vitamins-Minerals (PRESERVISION/LUTEIN PO) Take by mouth.    Marland Kitchen tiZANidine (ZANAFLEX) 2 MG tablet Take 1 tablet (2 mg total) by mouth at bedtime for 7 days, THEN 2 tablets (4 mg total) at bedtime for 21 days. 49 tablet 3  . aspirin 81 MG tablet Take 1 tablet (81 mg total) by mouth daily. 30 tablet 0  .  atorvastatin (LIPITOR) 20 MG tablet Take 1 tablet (20 mg total) by mouth daily. 90 tablet 1   No facility-administered medications prior to visit.      Allergies:   Patient has no known allergies.   Social History   Socioeconomic History  . Marital status: Married    Spouse name: Not on file  . Number of children: 3  . Years of education: 55  . Highest education level: Not on file  Occupational History  . 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 Financial resource strain: Not on file  . Food insecurity:    Worry: Not on file    Inability: Not on file  . Transportation needs:    Medical: Not on file    Non-medical: Not on file  Tobacco Use  . Smoking  status: Never Smoker  . Smokeless tobacco: Never Used  Substance and Sexual Activity  . Alcohol use: Yes    Comment: occ. beer  . Drug use: No  . Sexual activity: Yes    Partners: Female  Lifestyle  . Physical activity:    Days per week: Not on file    Minutes per session: Not on file  . Stress: Not on file  Relationships  . Social connections:    Talks on phone: Not on file    Gets together: Not on file    Attends religious service: Not on file    Active member of club or organization: Not on file    Attends meetings of clubs or organizations: Not on file    Relationship status: Not on file  Other Topics Concern  . 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 2 dtrs , 1 son - homicide. 5 grandchildren.       Family History:  The patient's family history includes Cancer - Other in his sister; Diabetes in his maternal grandmother; Heart attack in his father; Heart disease in his brother and sister; Kidney disease in his brother; Leukemia in his mother.   ROS:   Please see the history of present illness.    ROS All other systems reviewed and are negative.   PHYSICAL EXAM:   VS:  BP 135/84   Pulse 67   Ht 5\' 11"  (1.803 m)   Wt 174 lb 12.8 oz (79.3 kg)   SpO2 99%   BMI 24.38 kg/m    GENERAL:  Well appearing BM in NAD HEENT:  PERRL, EOMI, sclera are clear. Oropharynx is clear. NECK:  No jugular venous distention, carotid upstroke brisk and symmetric, no bruits, no thyromegaly or adenopathy LUNGS:  Clear to auscultation bilaterally CHEST:  Unremarkable HEART:  RRR,  PMI not displaced or sustained,S1 and S2 within normal limits, no S3, no S4: no clicks, no rubs, no murmurs ABD:  Soft, nontender. BS +, no masses or bruits. No hepatomegaly, no splenomegaly EXT:  2 + pulses throughout, no edema, no cyanosis no clubbing SKIN:  Warm and dry.  No rashes NEURO:  Alert and oriented x 3. Cranial nerves II through XII intact. Some left sided  weakness persists. PSYCH:  Cognitively intact    Wt Readings from Last 3 Encounters:  07/27/18 174 lb 12.8 oz (79.3 kg)  07/26/18 172 lb 8 oz (78.2 kg)  07/20/18 174 lb (78.9 kg)      Studies/Labs Reviewed:   EKG:  EKG is not ordered today.    Recent  Labs: 03/13/2018: TSH 0.549 07/20/2018: ALT 326; BUN 16; Creatinine, Ser 0.81; Potassium 4.7; Sodium 145 07/26/2018: Hemoglobin 13.4; Platelets 361.0   Lipid Panel    Component Value Date/Time   CHOL 154 07/20/2018 1010   TRIG 101 07/20/2018 1010   HDL 43 07/20/2018 1010   CHOLHDL 3.6 07/20/2018 1010   CHOLHDL 4.1 03/13/2018 0400   VLDL 31 03/13/2018 0400   LDLCALC 91 07/20/2018 1010    Additional studies/ records that were reviewed today include:   Echo 03/13/2018 LV EF: 65% -   70% Study Conclusions  - Left ventricle: The cavity size was normal. Wall thickness was   increased in a pattern of mild LVH. Systolic function was   vigorous. The estimated ejection fraction was in the range of 65%   to 70%. Wall motion was normal; there were no regional wall   motion abnormalities. - Aortic valve: There was trivial regurgitation.    ASSESSMENT:    1. PAF (paroxysmal atrial fibrillation) ([place])   2. H/O: CVA (cerebrovascular accident)   3. Essential hypertension   4. Hyperlipidemia, unspecified hyperlipidemia type      PLAN:  In order of problems listed above:  1. PAF: recommend switching ASA to Eliquis 5 mg bid. Continue BP control. Asymptomatic.   2. History of CVA: Recent hemorrhagic stroke involving right thalamus. Some residual left upper extremity weakness.   3. Hypertension: Blood pressure control improved.  4. Hyperlipidemia: LDL 90. Goal < 70. Will increase lipitor to 40 mg daily. Repeat labs in 3 months.  5. DM2: Managed by primary care provider.  On metformin.  I will see in 6 months.   Medication Adjustments/Labs and Tests Ordered: Current medicines are reviewed at length with the patient today.   Concerns regarding medicines are outlined above.  Medication changes, Labs and Tests ordered today are listed in the Patient Instructions below. Patient Instructions  Increase lipitor to 40 mg daily  Stop ASA  Start Eliquis 5 mg twice a day. This is a blood thinner to reduce your risk of recurrent stroke.    Signed, Dezmond Downie Martinique, MD  07/27/2018 11:03 AM    [facility] [place], [place], [place]  [address] Phone: [phone]; Fax: [phone]

## 2018-07-26 ENCOUNTER — Encounter: Payer: Self-pay | Admitting: Internal Medicine

## 2018-07-26 ENCOUNTER — Ambulatory Visit: Payer: Medicare Other | Attending: Internal Medicine | Admitting: Physical Therapy

## 2018-07-26 ENCOUNTER — Ambulatory Visit (INDEPENDENT_AMBULATORY_CARE_PROVIDER_SITE_OTHER): Payer: Medicare Other | Admitting: Internal Medicine

## 2018-07-26 VITALS — BP 126/80 | HR 71 | Temp 98.1°F | Resp 16 | Ht 71.0 in | Wt 172.5 lb

## 2018-07-26 DIAGNOSIS — R2689 Other abnormalities of gait and mobility: Secondary | ICD-10-CM | POA: Insufficient documentation

## 2018-07-26 DIAGNOSIS — R945 Abnormal results of liver function studies: Secondary | ICD-10-CM | POA: Diagnosis not present

## 2018-07-26 DIAGNOSIS — R399 Unspecified symptoms and signs involving the genitourinary system: Secondary | ICD-10-CM | POA: Diagnosis not present

## 2018-07-26 DIAGNOSIS — I480 Paroxysmal atrial fibrillation: Secondary | ICD-10-CM

## 2018-07-26 DIAGNOSIS — E119 Type 2 diabetes mellitus without complications: Secondary | ICD-10-CM

## 2018-07-26 DIAGNOSIS — R278 Other lack of coordination: Secondary | ICD-10-CM | POA: Diagnosis not present

## 2018-07-26 DIAGNOSIS — I69254 Hemiplegia and hemiparesis following other nontraumatic intracranial hemorrhage affecting left non-dominant side: Secondary | ICD-10-CM | POA: Insufficient documentation

## 2018-07-26 DIAGNOSIS — R7989 Other specified abnormal findings of blood chemistry: Secondary | ICD-10-CM

## 2018-07-26 DIAGNOSIS — M6281 Muscle weakness (generalized): Secondary | ICD-10-CM | POA: Insufficient documentation

## 2018-07-26 DIAGNOSIS — R2681 Unsteadiness on feet: Secondary | ICD-10-CM | POA: Diagnosis not present

## 2018-07-26 LAB — CBC WITH DIFFERENTIAL/PLATELET
Basophils Absolute: 0.1 10*3/uL (ref 0.0–0.1)
Basophils Relative: 1.7 % (ref 0.0–3.0)
Eosinophils Absolute: 0.1 10*3/uL (ref 0.0–0.7)
Eosinophils Relative: 1.5 % (ref 0.0–5.0)
HCT: 41.6 % (ref 39.0–52.0)
Hemoglobin: 13.4 g/dL (ref 13.0–17.0)
Lymphocytes Relative: 31.3 % (ref 12.0–46.0)
Lymphs Abs: 2 10*3/uL (ref 0.7–4.0)
MCHC: 32.3 g/dL (ref 30.0–36.0)
MCV: 88.3 fl (ref 78.0–100.0)
Monocytes Absolute: 0.5 10*3/uL (ref 0.1–1.0)
Monocytes Relative: 8.3 % (ref 3.0–12.0)
Neutro Abs: 3.7 10*3/uL (ref 1.4–7.7)
Neutrophils Relative %: 57.2 % (ref 43.0–77.0)
Platelets: 361 10*3/uL (ref 150.0–400.0)
RBC: 4.71 Mil/uL (ref 4.22–5.81)
RDW: 14.4 % (ref 11.5–15.5)
WBC: 6.5 10*3/uL (ref 4.0–10.5)

## 2018-07-26 LAB — URINALYSIS, ROUTINE W REFLEX MICROSCOPIC
Bilirubin Urine: NEGATIVE
Hgb urine dipstick: NEGATIVE
Nitrite: POSITIVE — AB
RBC / HPF: NONE SEEN (ref 0–?)
Specific Gravity, Urine: 1.025 (ref 1.000–1.030)
Total Protein, Urine: NEGATIVE
Urine Glucose: NEGATIVE
Urobilinogen, UA: 0.2 (ref 0.0–1.0)
pH: 5.5 (ref 5.0–8.0)

## 2018-07-26 LAB — HEMOGLOBIN A1C: Hgb A1c MFr Bld: 5.9 % (ref 4.6–6.5)

## 2018-07-26 NOTE — Assessment & Plan Note (Signed)
DM: On metformin, checking A1c. HTN: Seems well controlled on current medications including amlodipine, diltiazem, metoprolol.  Last BMP satisfactory Increased LFTs:  On chronic metformin and Lipitor, they are unlikely to be the culprits.  He was taken some Tylenol before but has stopped. Other recently prescribed medication and potential culprits: --04-2018: Started amlodipine, diltiazem, gabapentin. -- 12- 2019 started baclofen --Tizandine: : Started few days ago. Hepatitis C serology negative a couple of years ago.   Plan: Check LFTs, hepatitis B serology. PAF: Last visit with cardiology 04-2018, at the time, diltiazem was increased for better heart rate control. Hemorrhagic thalamic stroke 02-2017: Has seen neurology several times, last visit 07/20/2018, was recommended to continue with aspirin and they were considering restart Eliquis.  Needs strict control of BP, DM and cholesterol. LUTS: Some urinary frequency and urgency, check a UA urine culture RTC 3 months

## 2018-07-26 NOTE — Therapy (Signed)
[facility] [address] [place], [place], [address] Phone: [phone]   Fax:  [phone]  Physical Therapy Treatment  Patient Details  Name: Marcus Beasley MRN: [id number] Date of Birth: [birth year] No data recorded  Encounter Date: 07/26/2018  PT End of Session - 07/26/18 1728    Visit Number  13    Number of Visits  17    Date for PT Re-Evaluation  08/31/18    Authorization Type  [employer] Medicare; will need 10th visit progress notes    PT Start Time  0932    PT Stop Time  1000   Pt had to leave early for MD appt   PT Time Calculation (min)  28 min    Equipment Utilized During Treatment  Gait belt    Activity Tolerance  Patient tolerated treatment well    Behavior During Therapy  WFL for tasks assessed/performed       Past Medical History:  Diagnosis Date  . BPH (benign prostatic hyperplasia)    (-) Bx 2015  . Diabetes mellitus without complication ([place])   . Elevated PSA    Prostate Bx in 06/2013 was benign  . HTN (hypertension)   . Hyperlipidemia   . Macular degeneration, age related     Past Surgical History:  Procedure Laterality Date  . ABCESS DRAINAGE     abdomen- 26 day hospitalization 1968  . COLONOSCOPY  2016  . HERNIA REPAIR  summer '11   umbilical, dr Ninfa Linden   . POLYPECTOMY    . PROSTATE BIOPSY  06-2013 , 07-2017   (-), (-)    There were no vitals filed for this visit.  Subjective Assessment - 07/26/18 0936    Subjective  Need to leave a little early for Dr. Larose Kells appt    Patient is accompained by:  Family member   friend   Limitations  Walking;Standing    Patient Stated Goals  Pt wants to be able to walk again.  Wants to be able to use L hand and arm again; wants to drive, do yardwork.    Currently in Pain?  No/denies                       [facility] Adult PT Treatment/Exercise - 07/26/18 0001      Ambulation/Gait   Ambulation/Gait  Yes    Ambulation/Gait Assistance  4:  Min guard    Ambulation/Gait Assistance Details  Tactile cues provided at L hip/pelvis to guide initial L hip elevation/forward progression to help with initiation of swing phase of gait     Ambulation Distance (Feet)  230 Feet   then 140   Assistive device  Rolling walker    Gait Pattern  Step-through pattern;Decreased step length - left;Decreased stance time - left;Decreased dorsiflexion - left;Poor foot clearance - left;Left flexed knee in stance;Trunk rotated posteriorly on left;Narrow base of support    Ambulation Surface  Level;Indoor      Neuro Re-ed    Neuro Re-ed Details   Seated edge of mat with work on pelvic mobility and dissociation and flexibility.  Seated initially on green disc, then transition to mat surface, anterior/posterior pelvic tilts x 10 reps, 2 sets with facilitation at pelvis for technique; lateral pelvic tilts x 10 reps; scooting edge of mat, one hip at a time, 5 reps.  Standing NMR to LLE:  Terminal knee extension, x 10 reps (occasional tactile cues at L quads.  Heel/toe raises x  10 reps               PT Short Term Goals - 07/08/18 0950      PT SHORT TERM GOAL #1   Title  Pt will be independent with HEP for improved strength, balance, gait.  TARGET 07/02/18 (may be modified due to scheduling around holidays)    Time  4    Period  Weeks    Status  Achieved      PT SHORT TERM GOAL #2   Title  Pt will perform at least 8 of 10 reps of sit<>stand transfers independently, minimal to no UE support, for improved lower extremity strengthening and transfer efficiency.    Time  4    Period  Weeks    Status  Achieved      PT SHORT TERM GOAL #3   Title  Pt will improve Berg Balance score to at least 27/56 for decreased fall risk.    Baseline  34/56 07/05/18    Time  4    Period  Weeks    Status  Achieved      PT SHORT TERM GOAL #4   Title  Pt will improve TUG score to less than or equal to 25 seconds for decreased fall risk.    Baseline  30.66 sec, 28.84 sec  with cane    Time  4    Period  Weeks    Status  Not Met      PT SHORT TERM GOAL #5   Title  Pt will verbalize understanding of fall prevention in home environment.    Time  4    Period  Weeks    Status  Achieved        PT Long Term Goals - 07/15/18 1036      PT LONG TERM GOAL #1   Title  Pt will verbalize plans for continued community fitness upon d/c from PT.  TARGET 07/30/18    Time  8    Period  Weeks    Status  On-going      PT LONG TERM GOAL #2   Title  Pt will improve gait velocity to at least 1.8 ft/sec for improved gait efficiency and safety.    Time  8    Period  Weeks    Status  On-going      PT LONG TERM GOAL #3   Title  Pt will improve Berg score to at least 40/56 for decreased fall risk.    Time  8    Period  Weeks    Status  Revised      PT LONG TERM GOAL #4   Title  Pt will improve TUG score to less than or equal to 20 seconds for decreased fall risk.    Time  8    Period  Weeks    Status  Revised      PT LONG TERM GOAL #5   Title  Pt will ambulate at least 150 ft using least restrictive assistive device, modified independently, for improved independence with gait.    Time  8    Period  Weeks    Status  On-going      PT LONG TERM GOAL #6   Title  Pt will negotiate at least 12 steps, one handrail, step-through pattern, modified independently, for improved stair negotiation in home.    Time  8    Period  Weeks    Status  On-going  Plan - 07/26/18 1728    Clinical Impression Statement  Shortened PT session today, as pt needed to leave early for MD appointment.  Pt did walk into and out of therapy session with therapist/family supervision, and reports walking at home, only using w/c to sit in for eating at table.  Treatment session focused on NMR with pelvic mobility/dissociation activities seated edge of mat (pt has difficulty with sequencing unilateral scooting forward and back at edge of mat), standing LLE neuro-reeducation and gait  training with walker (not wearing trial of AFO due to time constraints with pt leaving early).  Pt will continue to benefit from skilled PT to address strength, balance, and gait training to continue to work on funcitonal moiblity and independence.      Rehab Potential  Good    Clinical Impairments Affecting Rehab Potential  good family support; independnet prior to CVA    PT Frequency  2x / week    PT Duration  8 weeks   plus eval   PT Treatment/Interventions  ADLs/Self Care Home Management;Electrical Stimulation;Therapeutic exercise;Therapeutic activities;Functional mobility training;Gait training;Stair training;DME Instruction;Balance training;Neuromuscular re-education;Patient/family education;Orthotic Fit/Training;Manual techniques    PT Next Visit Plan  Tall kneeling>1/2 kneeling as able; pelvic mobility (on therapy ball?), standing exercises for balance LLE as stance/LLE strengthening; gait training; check LTGs and discuss POC (this is week 8!)   Look at scheduling additional appointments, ?[place]   Consulted and Agree with Plan of Care  Patient;Family member/caregiver    Family Member Consulted  granddaugther, wife       Patient will benefit from skilled therapeutic intervention in order to improve the following deficits and impairments:  Abnormal gait, Decreased balance, Decreased mobility, Difficulty walking, Decreased strength, Impaired tone, Postural dysfunction  Visit Diagnosis: Other abnormalities of gait and mobility  Unsteadiness on feet     Problem List Patient Active Problem List   Diagnosis Date Noted  . Spastic hemiplegia affecting nondominant side ([place]) 07/07/2018  . Abnormality of gait 06/09/2018  . Neuropathic pain   . Labile blood pressure   . PAF (paroxysmal atrial fibrillation) ([place])   . Hemiparesis affecting left side as late effect of stroke ([place])   . Thalamic hemorrhage ([place]) 03/17/2018  . Benign essential HTN   . Dyslipidemia   . Hemorrhagic stroke  ([place])   . ICH (intracerebral hemorrhage) ([place]) 03/12/2018  . Prostate cancer ([place]) 01/29/2017  . 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 Diabetes ([place]) 12/14/2015  . PCP NOTES >>>>>>>>>>>>>>>>>>>>>>>>>>>>>>. 08/06/2015  . Dizziness and giddiness 01/29/2015  . Umbilical hernia 51/07/5850  . Elevated PSA, less than 10 ng/ml 04/19/2012  . Annual physical exam 01/24/2011  . Hyperlipidemia 01/03/2010  . Essential hypertension 09/20/2007    MARRIOTT,AMY W. 07/26/2018, 5:38 PM  Frazier Butt., PT   [facility] [address] [place], [place], [address] Phone: [phone]   Fax:  [phone]  Name: Marcus Beasley MRN: [id number] Date of Birth: Nov 05, 1945

## 2018-07-26 NOTE — Progress Notes (Signed)
Subjective:    Patient ID: Marcus Beasley, male    DOB: 1945/11/10, 73 y.o.   MRN: [id number]  DOS:  07/26/2018 Type of visit - description: Checkup Here with his granddaughter who is a Marine scientist and his wife. LFTs were recently elevated, he was taking Tylenol sporadically, denies EtOH, denies any herbal supplements.  Since then he stopped Tylenol completely. Atrial fibrillation: Cardiology note reviewed Stroke: Neurology notes reviewed Lately has noted some urinary frequency and urgency.  Urine has been slightly darker than usual. Also, complain of wax building up in his ears.  Review of Systems  Denies fever chills No chest pain, difficulty breathing or palpitation No nausea, vomiting, diarrhea.  No abdominal pain No dysuria, gross hematuria difficulty urinating.  Past Medical History:  Diagnosis Date  . BPH (benign prostatic hyperplasia)    (-) Bx 2015  . Diabetes mellitus without complication ([place])   . Elevated PSA    Prostate Bx in 06/2013 was benign  . HTN (hypertension)   . Hyperlipidemia   . Macular degeneration, age related     Past Surgical History:  Procedure Laterality Date  . ABCESS DRAINAGE     abdomen- 26 day hospitalization 1968  . COLONOSCOPY  2016  . HERNIA REPAIR  summer '11   umbilical, dr Ninfa Linden   . POLYPECTOMY    . PROSTATE BIOPSY  06-2013 , 07-2017   (-), (-)    Social History   Socioeconomic History  . Marital status: Married    Spouse name: Not on file  . Number of children: 3  . Years of education: 59  . Highest education level: Not on file  Occupational History  . 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 Financial resource strain: Not on file  . Food insecurity:    Worry: Not on file    Inability: Not on file  . Transportation needs:    Medical: Not on file    Non-medical: Not on file  Tobacco Use  . Smoking status: Never Smoker  . Smokeless tobacco: Never Used  Substance and  Sexual Activity  . Alcohol use: Yes    Comment: occ. beer  . Drug use: No  . Sexual activity: Yes    Partners: Female  Lifestyle  . Physical activity:    Days per week: Not on file    Minutes per session: Not on file  . Stress: Not on file  Relationships  . Social connections:    Talks on phone: Not on file    Gets together: Not on file    Attends religious service: Not on file    Active member of club or organization: Not on file    Attends meetings of clubs or organizations: Not on file    Relationship status: Not on file  . Intimate partner violence:    Fear of current or ex partner: Not on file    Emotionally abused: Not on file    Physically abused: Not on file    Forced sexual activity: Not on file  Other Topics Concern  . 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 2 dtrs , 1 son - homicide. 5 grandchildren.        Allergies as of 07/26/2018   No Known Allergies     Medication List       Accurate as of July 26, 2018 11:06 AM. Always use your  most recent med list.        amLODipine 2.5 MG tablet Commonly known as:  NORVASC Take 1 tablet (2.5 mg total) by mouth daily.   aspirin 81 MG tablet Take 1 tablet (81 mg total) by mouth daily.   atorvastatin 20 MG tablet Commonly known as:  LIPITOR Take 1 tablet (20 mg total) by mouth daily.   baclofen 20 MG tablet Commonly known as:  LIORESAL Take 1 tablet (20 mg total) by mouth 3 (three) times daily.   diltiazem 240 MG 24 hr capsule Commonly known as:  CARDIZEM CD Take 1 capsule (240 mg total) by mouth daily.   gabapentin 600 MG tablet Commonly known as:  NEURONTIN Take 1.5 tablets (900 mg total) by mouth 3 (three) times daily for 30 days.   meclizine 25 MG tablet Commonly known as:  ANTIVERT Take 1 tablet (25 mg total) by mouth 2 (two) times daily as needed for dizziness.   metFORMIN 850 MG tablet Commonly known as:  GLUCOPHAGE Take 1 tablet (850 mg total) by  mouth 2 (two) times daily with a meal.   metoprolol tartrate 50 MG tablet Commonly known as:  LOPRESSOR TAKE 1 TABLET BY MOUTH TWICE A DAY   multivitamin with minerals Tabs tablet Take 1 tablet by mouth daily. Centrum   PRESERVISION/LUTEIN PO Take by mouth.   tiZANidine 2 MG tablet Commonly known as:  ZANAFLEX Take 1 tablet (2 mg total) by mouth at bedtime for 7 days, THEN 2 tablets (4 mg total) at bedtime for 21 days. Start taking on:  July 20, 2018           Objective:   Physical Exam BP 126/80 (BP Location: Right Arm, Patient Position: Sitting, Cuff Size: Normal)   Pulse 71   Temp 98.1 F (36.7 C) (Oral)   Resp 16   Ht 5\' 11"  (1.803 m)   Wt 172 lb 8 oz (78.2 kg)   SpO2 96%   BMI 24.06 kg/m     General:   Well developed, NAD, BMI noted.  HEENT:  Normocephalic . Face symmetric, atraumatic. Left ear normal.  Right ear: Moderate amount of wax. Lungs:  CTA B Normal respiratory effort, no intercostal retractions, no accessory muscle use. Heart: Seems regular today, no murmur.  no pretibial edema bilaterally  Abdomen:  Not distended, soft, non-tender. No rebound or rigidity.  No organomegaly. Skin: Not pale. Not jaundice Neurologic:  alert & oriented X3.   Psych--  Cognition and judgment appear intact.  Cooperative with normal attention span and concentration.  Behavior appropriate. No anxious or depressed appearing.  Assessment      Assessment DM  ---->  DX 07-2015, A1c 8.0; started metformin Neuropathy, mild.  DX 08-2017 (in sensitive type) HTN 24 9 Hyperlipidemia BPH, increased PSA, BX (-) -2015, (-) 07-2017 per pt  Hemorrhagic thalamic stroke 02-2018 Paroxysmal atrial fibrillation found 02/2018  PLAN DM: On metformin, checking A1c. HTN: Seems well controlled on current medications including amlodipine, diltiazem, metoprolol.  Last BMP satisfactory Increased LFTs:  On chronic metformin and Lipitor, they are unlikely to be the culprits.  He was  taken some Tylenol before but has stopped. Other recently prescribed medication and potential culprits: --04-2018: Started amlodipine, diltiazem, gabapentin. -- 12- 2019 started baclofen --Tizandine: : Started few days ago. Hepatitis C serology negative a couple of years ago.   Plan: Check LFTs, hepatitis B serology. PAF: Last visit with cardiology 04-2018, at the time, diltiazem was increased for better heart rate  control. Hemorrhagic thalamic stroke 02-2017: Has seen neurology several times, last visit 07/20/2018, was recommended to continue with aspirin and they were considering restart Eliquis.  Needs strict control of BP, DM and cholesterol. LUTS: Some urinary frequency and urgency, check a UA urine culture RTC 3 months

## 2018-07-26 NOTE — Patient Instructions (Addendum)
Please schedule Medicare Wellness with Glenard Haring.   GO TO THE LAB : Get the blood work     GO TO THE FRONT DESK Schedule your next appointment   a checkup in 3 months

## 2018-07-26 NOTE — Progress Notes (Signed)
Pre visit review using our clinic review tool, if applicable. No additional management support is needed unless otherwise documented below in the visit note. 

## 2018-07-27 ENCOUNTER — Encounter: Payer: Self-pay | Admitting: Cardiology

## 2018-07-27 ENCOUNTER — Ambulatory Visit: Payer: Medicare Other | Admitting: Cardiology

## 2018-07-27 VITALS — BP 135/84 | HR 67 | Ht 71.0 in | Wt 174.8 lb

## 2018-07-27 DIAGNOSIS — Z8673 Personal history of transient ischemic attack (TIA), and cerebral infarction without residual deficits: Secondary | ICD-10-CM

## 2018-07-27 DIAGNOSIS — E785 Hyperlipidemia, unspecified: Secondary | ICD-10-CM | POA: Diagnosis not present

## 2018-07-27 DIAGNOSIS — I10 Essential (primary) hypertension: Secondary | ICD-10-CM

## 2018-07-27 DIAGNOSIS — I480 Paroxysmal atrial fibrillation: Secondary | ICD-10-CM

## 2018-07-27 MED ORDER — ATORVASTATIN CALCIUM 40 MG PO TABS: 40.0000 mg | ORAL_TABLET | Freq: Every day | ORAL | 3 refills | Status: DC

## 2018-07-27 MED ORDER — APIXABAN 5 MG PO TABS: 5.0000 mg | ORAL_TABLET | Freq: Two times a day (BID) | ORAL | 12 refills | Status: DC

## 2018-07-27 NOTE — Patient Instructions (Signed)
Increase lipitor to 40 mg daily  Stop ASA  Start Eliquis 5 mg twice a day. This is a blood thinner to reduce your risk of recurrent stroke.

## 2018-07-28 ENCOUNTER — Other Ambulatory Visit: Payer: Self-pay | Admitting: Internal Medicine

## 2018-07-28 MED ORDER — CIPROFLOXACIN HCL 500 MG PO TABS: 500.0000 mg | ORAL_TABLET | Freq: Two times a day (BID) | ORAL | 0 refills | Status: DC

## 2018-07-29 ENCOUNTER — Encounter: Payer: Self-pay | Admitting: Physical Therapy

## 2018-07-29 ENCOUNTER — Ambulatory Visit: Payer: Medicare Other | Admitting: Physical Therapy

## 2018-07-29 ENCOUNTER — Other Ambulatory Visit: Payer: Medicare Other

## 2018-07-29 ENCOUNTER — Encounter: Payer: Self-pay | Admitting: Internal Medicine

## 2018-07-29 ENCOUNTER — Ambulatory Visit: Payer: Medicare Other | Admitting: Occupational Therapy

## 2018-07-29 DIAGNOSIS — R2689 Other abnormalities of gait and mobility: Secondary | ICD-10-CM

## 2018-07-29 DIAGNOSIS — I69254 Hemiplegia and hemiparesis following other nontraumatic intracranial hemorrhage affecting left non-dominant side: Secondary | ICD-10-CM | POA: Diagnosis not present

## 2018-07-29 DIAGNOSIS — R2681 Unsteadiness on feet: Secondary | ICD-10-CM | POA: Diagnosis not present

## 2018-07-29 DIAGNOSIS — M6281 Muscle weakness (generalized): Secondary | ICD-10-CM | POA: Diagnosis not present

## 2018-07-29 DIAGNOSIS — R278 Other lack of coordination: Secondary | ICD-10-CM | POA: Diagnosis not present

## 2018-07-29 LAB — HEPATIC FUNCTION PANEL
AG Ratio: 1.7 (calc) (ref 1.0–2.5)
ALT: 70 U/L — ABNORMAL HIGH (ref 9–46)
AST: 15 U/L (ref 10–35)
Albumin: 4.4 g/dL (ref 3.6–5.1)
Alkaline phosphatase (APISO): 96 U/L (ref 35–144)
Bilirubin, Direct: 0.2 mg/dL (ref 0.0–0.2)
Globulin: 2.6 g/dL (calc) (ref 1.9–3.7)
Indirect Bilirubin: 0.3 mg/dL (calc) (ref 0.2–1.2)
Total Bilirubin: 0.5 mg/dL (ref 0.2–1.2)
Total Protein: 7 g/dL (ref 6.1–8.1)

## 2018-07-29 LAB — URINE CULTURE
MICRO NUMBER:: 141648
SPECIMEN QUALITY:: ADEQUATE

## 2018-07-29 LAB — TEST AUTHORIZATION

## 2018-07-29 LAB — HEPATITIS B SURFACE ANTIGEN: Hepatitis B Surface Ag: NONREACTIVE

## 2018-07-29 LAB — HEPATITIS B CORE ANTIBODY, TOTAL: Hep B Core Total Ab: NONREACTIVE

## 2018-07-29 LAB — HEPATITIS B SURFACE ANTIBODY,QUALITATIVE: Hep B S Ab: REACTIVE — AB

## 2018-07-29 NOTE — Therapy (Signed)
[facility] [address] [place], [place], [address] Phone: [phone]   Fax:  [phone]  Physical Therapy Treatment  Patient Details  Name: Marcus Beasley MRN: [id number] Date of Birth: 1945/06/29 No data recorded  Encounter Date: 07/29/2018  PT End of Session - 07/29/18 1129    Visit Number  14    Number of Visits  17    Date for PT Re-Evaluation  08/31/18    Authorization Type  [employer] Medicare; will need 10th visit progress notes    PT Start Time  0849    PT Stop Time  0932    PT Time Calculation (min)  43 min    Equipment Utilized During Treatment  Gait belt    Activity Tolerance  Patient tolerated treatment well   1 episode of L knee buckling with gait   Behavior During Therapy  WFL for tasks assessed/performed       Past Medical History:  Diagnosis Date  . BPH (benign prostatic hyperplasia)    (-) Bx 2015  . Diabetes mellitus without complication ([place])   . Elevated PSA    Prostate Bx in 06/2013 was benign  . HTN (hypertension)   . Hyperlipidemia   . Macular degeneration, age related     Past Surgical History:  Procedure Laterality Date  . ABCESS DRAINAGE     abdomen- 26 day hospitalization 1968  . COLONOSCOPY  2016  . HERNIA REPAIR  summer '11   umbilical, dr Ninfa Linden   . POLYPECTOMY    . PROSTATE BIOPSY  06-2013 , 07-2017   (-), (-)    There were no vitals filed for this visit.  Subjective Assessment - 07/29/18 1123    Subjective  Have to go for bloodwork today.  Everything okay from the doctor.  I haven't used the knee cage at home since the orthotic consultation.    Patient is accompained by:  Family member   friend   Limitations  Walking;Standing    Patient Stated Goals  Pt wants to be able to walk again.  Wants to be able to use L hand and arm again; wants to drive, do yardwork.    Currently in Pain?  No/denies                       [facility] Adult PT Treatment/Exercise -  07/29/18 0001      Transfers   Transfers  Sit to Stand;Stand to Sit    Sit to Stand  5: Supervision;With upper extremity assist;From chair/3-in-1;From bed;Without upper extremity assist    Stand to Sit  5: Supervision;With upper extremity assist;Without upper extremity assist;To chair/3-in-1      Ambulation/Gait   Ambulation/Gait  Yes    Ambulation/Gait Assistance  4: Min guard    Ambulation Distance (Feet)  115 Feet   x 3, 100 ft, 60 ft x 2   Assistive device  Rolling walker;Straight cane   cane with quad tip   Gait Pattern  Step-through pattern;Decreased step length - left;Decreased stance time - left;Decreased dorsiflexion - left;Poor foot clearance - left;Left flexed knee in stance;Trunk rotated posteriorly on left;Narrow base of support    Ambulation Surface  Level;Indoor    Gait velocity  22.59 sec = 1.45 ft/sec   20.56 sec with cane= 1.6 ft/sec     Berg Balance Test   Sit to Stand  Able to stand without using hands and stabilize independently    Standing Unsupported  Able to stand safely 2 minutes    Sitting with Back Unsupported but Feet Supported on Floor or Stool  Able to sit safely and securely 2 minutes    Stand to Sit  Sits safely with minimal use of hands    Transfers  Able to transfer safely, minor use of hands    Standing Unsupported with Eyes Closed  Able to stand 10 seconds safely    Standing Ubsupported with Feet Together  Able to place feet together independently and stand 1 minute safely    From Standing, Reach Forward with Outstretched Arm  Can reach forward >12 cm safely (5")    From Standing Position, Pick up Object from Floor  Able to pick up shoe, needs supervision    From Standing Position, Turn to Look Behind Over each Shoulder  Looks behind one side only/other side shows less weight shift    Turn 360 Degrees  Needs close supervision or verbal cueing    Standing Unsupported, Alternately Place Feet on Step/Stool  Able to complete >2 steps/needs minimal assist     Standing Unsupported, One Foot in Front  Able to take small step independently and hold 30 seconds    Standing on One Leg  Tries to lift leg/unable to hold 3 seconds but remains standing independently    Total Score  42      Timed Up and Go Test   TUG  Normal TUG    Normal TUG (seconds)  27.28   24.47     High Level Balance   High Level Balance Comments  Standing at counter:  tandem stance, 3 reps x 10 seconds, tactile and verbal cues for terminal knee extension on LLE.             PT Education - 07/29/18 1128    Education Details  Progress towards goals, POC and plans to extend POC to address further strength, balance, gait.      Person(s) Educated  Patient;Other (comment)   Friend   Methods  Explanation;Demonstration    Comprehension  Verbalized understanding       PT Short Term Goals - 07/08/18 0950      PT SHORT TERM GOAL #1   Title  Pt will be independent with HEP for improved strength, balance, gait.  TARGET 07/02/18 (may be modified due to scheduling around holidays)    Time  4    Period  Weeks    Status  Achieved      PT SHORT TERM GOAL #2   Title  Pt will perform at least 8 of 10 reps of sit<>stand transfers independently, minimal to no UE support, for improved lower extremity strengthening and transfer efficiency.    Time  4    Period  Weeks    Status  Achieved      PT SHORT TERM GOAL #3   Title  Pt will improve Berg Balance score to at least 27/56 for decreased fall risk.    Baseline  34/56 07/05/18    Time  4    Period  Weeks    Status  Achieved      PT SHORT TERM GOAL #4   Title  Pt will improve TUG score to less than or equal to 25 seconds for decreased fall risk.    Baseline  30.66 sec, 28.84 sec with cane    Time  4    Period  Weeks    Status  Not Met  PT SHORT TERM GOAL #5   Title  Pt will verbalize understanding of fall prevention in home environment.    Time  4    Period  Weeks    Status  Achieved        PT Long Term Goals -  07/29/18 0857      PT LONG TERM GOAL #1   Title  Pt will verbalize plans for continued community fitness upon d/c from PT.  TARGET 07/30/18    Time  8    Period  Weeks    Status  On-going      PT LONG TERM GOAL #2   Title  Pt will improve gait velocity to at least 1.8 ft/sec for improved gait efficiency and safety.    Baseline  Progressing, gait velocity 1.45 ft/sec with RW, 1.6 ft/sec with cane (improved from 1.04 ft/sec with RW)-07/29/2018    Time  8    Period  Weeks    Status  Not Met      PT LONG TERM GOAL #3   Title  Pt will improve Berg score to at least 40/56 for decreased fall risk.    Baseline  07/29/2018:  42/56    Time  8    Period  Weeks    Status  Achieved      PT LONG TERM GOAL #4   Title  Pt will improve TUG score to less than or equal to 20 seconds for decreased fall risk.    Baseline  24.47 sec at best with cane (improved from 28.84 sec)-07/29/2018    Time  8    Period  Weeks    Status  Not Met      PT LONG TERM GOAL #5   Title  Pt will ambulate at least 150 ft using least restrictive assistive device, modified independently, for improved independence with gait.    Baseline  PM for distance, needs supervision/min guard due to knee instability-awaiting AFO    Time  8    Period  Weeks    Status  Partially Met      PT LONG TERM GOAL #6   Title  Pt will negotiate at least 12 steps, one handrail, step-through pattern, modified independently, for improved stair negotiation in home.    Baseline  reports step-to pattern with supervision    Time  8    Period  Weeks    Status  Not Met            Plan - 07/29/18 1132    Clinical Impression Statement  Assessed LTGs this visit (incorrectly thought this was week 8 of 8), but it is week 7 of 8.  Pt has not met LTG 2 (but has improved gait velocity to 1.45 ft/sec with RW from 1.04 ft/sec at eval (with cane is 1.6 ft/sec).  LTG 3 met for Berg score improved to 42/56, LTG 4 not met, but progressing with TUG score decreaseing  to 27.28 sec with RW and 24.47 sec with cane.  LTG 5 partially met for gait distance, but pt continues to need supervision to min guard, as pt's L knee buckles at times.  Pt has had orthotic consult and is awaiting AFO to address foot clearance, knee instability; he is no longer wearing knee cage at home.  He is progressing towards goals and he continues to be at fall risk per Berg, TUG, gait velocity measures.       Rehab Potential  Good    Clinical  Impairments Affecting Rehab Potential  good family support; independnet prior to CVA    PT Frequency  2x / week    PT Duration  8 weeks   plus eval   PT Treatment/Interventions  ADLs/Self Care Home Management;Electrical Stimulation;Therapeutic exercise;Therapeutic activities;Functional mobility training;Gait training;Stair training;DME Instruction;Balance training;Neuromuscular re-education;Patient/family education;Orthotic Fit/Training;Manual techniques    PT Next Visit Plan  Tall kneeling>1/2 kneeling as able; pelvic mobility (on therapy ball?), standing exercises for balance LLE as stance/LLE strengthening; gait training; check LTGs and discuss POC (this is week 8!)   week of 2/10:  Check stairs, check DGI; update POC, goals, recert   Consulted and Agree with Plan of Care  Patient;Family member/caregiver    Family Member Consulted  granddaugther, wife       Patient will benefit from skilled therapeutic intervention in order to improve the following deficits and impairments:  Abnormal gait, Decreased balance, Decreased mobility, Difficulty walking, Decreased strength, Impaired tone, Postural dysfunction  Visit Diagnosis: Unsteadiness on feet  Other abnormalities of gait and mobility  Muscle weakness (generalized)     Problem List Patient Active Problem List   Diagnosis Date Noted  . Spastic hemiplegia affecting nondominant side ([place]) 07/07/2018  . Abnormality of gait 06/09/2018  . Neuropathic pain   . Labile blood pressure   . PAF  (paroxysmal atrial fibrillation) ([place])   . Hemiparesis affecting left side as late effect of stroke ([place])   . 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 Benign essential HTN   . Dyslipidemia   . Hemorrhagic stroke ([place])   . ICH (intracerebral hemorrhage) ([place]) 03/12/2018  . Prostate cancer ([place]) 01/29/2017  . 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 Diabetes ([place]) 12/14/2015  . PCP NOTES >>>>>>>>>>>>>>>>>>>>>>>>>>>>>>. 08/06/2015  . Dizziness and giddiness 01/29/2015  . Umbilical hernia 01/56/1537  . Elevated PSA, less than 10 ng/ml 04/19/2012  . Annual physical exam 01/24/2011  . Hyperlipidemia 01/03/2010  . Essential hypertension 09/20/2007    Mittie Knittel W. 07/29/2018, 11:43 AM  Frazier Butt., PT   [facility] [address] [place], [place], [address] Phone: [phone]   Fax:  [phone]  Name: ARVINE CLAYBURN MRN: [id number] Date of Birth: 1945-10-15

## 2018-08-02 ENCOUNTER — Ambulatory Visit: Payer: Medicare Other | Admitting: Physical Therapy

## 2018-08-02 ENCOUNTER — Encounter: Payer: Self-pay | Admitting: Physical Therapy

## 2018-08-02 DIAGNOSIS — M6281 Muscle weakness (generalized): Secondary | ICD-10-CM

## 2018-08-02 DIAGNOSIS — R2681 Unsteadiness on feet: Secondary | ICD-10-CM | POA: Diagnosis not present

## 2018-08-02 DIAGNOSIS — R2689 Other abnormalities of gait and mobility: Secondary | ICD-10-CM

## 2018-08-02 DIAGNOSIS — I69254 Hemiplegia and hemiparesis following other nontraumatic intracranial hemorrhage affecting left non-dominant side: Secondary | ICD-10-CM | POA: Diagnosis not present

## 2018-08-02 DIAGNOSIS — R278 Other lack of coordination: Secondary | ICD-10-CM | POA: Diagnosis not present

## 2018-08-02 NOTE — Therapy (Signed)
[facility] [address] [place], [place], [address] Phone: [phone]   Fax:  [phone]  Physical Therapy Treatment  Patient Details  Name: Marcus Beasley MRN: [id number] Date of Birth: May 17, 1946 No data recorded  Encounter Date: 08/02/2018  PT End of Session - 08/02/18 2030    Visit Number  15    Number of Visits  17    Date for PT Re-Evaluation  08/31/18    Authorization Type  [employer] Medicare; will need 10th visit progress notes    PT Start Time  0852    PT Stop Time  0934    PT Time Calculation (min)  42 min    Equipment Utilized During Treatment  Gait belt    Activity Tolerance  Patient tolerated treatment well    Behavior During Therapy  [facility] for tasks assessed/performed       Past Medical History:  Diagnosis Date  . BPH (benign prostatic hyperplasia)    (-) Bx 2015  . Diabetes mellitus without complication ([place])   . Elevated PSA    Prostate Bx in 06/2013 was benign  . HTN (hypertension)   . Hyperlipidemia   . Macular degeneration, age related     Past Surgical History:  Procedure Laterality Date  . ABCESS DRAINAGE     abdomen- 26 day hospitalization 1968  . COLONOSCOPY  2016  . HERNIA REPAIR  summer '11   umbilical, dr Ninfa Linden   . POLYPECTOMY    . PROSTATE BIOPSY  06-2013 , 07-2017   (-), (-)    There were no vitals filed for this visit.  Subjective Assessment - 08/02/18 0856    Subjective  Guess we are still waiting on [employer].  I guess I just need to trust my left leg more.    Patient is accompained by:  Family member   friend   Limitations  Walking;Standing    Patient Stated Goals  Pt wants to be able to walk again.  Wants to be able to use L hand and arm again; wants to drive, do yardwork.    Currently in Pain?  No/denies                       [facility] Adult PT Treatment/Exercise - 08/02/18 0001      Transfers   Transfers  Sit to Stand;Stand to Sit    Sit to Stand  5:  Supervision;With upper extremity assist;From chair/3-in-1;From bed;Without upper extremity assist    Stand to Sit  5: Supervision;With upper extremity assist;Without upper extremity assist;To chair/3-in-1      Ambulation/Gait   Ambulation/Gait  Yes    Ambulation/Gait Assistance  4: Min guard    Ambulation Distance (Feet)  230 Feet   rollator, 230 ft cane, 50 ft x 2 with cane   Assistive device  Rolling walker;Straight cane   cane with rubber quad tip, wearing trial Thusane PLS AFO   Gait Pattern  Step-through pattern;Decreased step length - left;Decreased stance time - left;Decreased dorsiflexion - left;Left flexed knee in stance;Trunk rotated posteriorly on left;Narrow base of support    Ambulation Surface  Level;Indoor    Stairs  Yes    Stairs Assistance  4: Min guard    Stair Management Technique  One rail Right;Alternating pattern;Forwards    Number of Stairs  4   x 3   Height of Stairs  6      Neuro Re-ed    Neuro Re-ed Details  Seated edge of mat with work on pelvic mobility and dissociation and flexibility:  anterior/posterior pelvic tilts x 10 reps, 2 sets with facilitation at pelvis for technique; lateral pelvic tilts x 10 reps; scooting edge of mat, one hip at a time forward and back, 5 reps.  Forward step ups LLE at 6" step, 2 sets x 5 reps, with tactile cues for glut/quad activation to maintain extension through LLE.      Knee/Hip Exercises: Aerobic   Stepper  End of session:  set pt up on Sci[employer], Seated stepper, Level 2.5, lower extremities only (x 11 minutes), initial cues for leg positioning (no charge)               PT Short Term Goals - 07/08/18 0950      PT SHORT TERM GOAL #1   Title  Pt will be independent with HEP for improved strength, balance, gait.  TARGET 07/02/18 (may be modified due to scheduling around holidays)    Time  4    Period  Weeks    Status  Achieved      PT SHORT TERM GOAL #2   Title  Pt will perform at least 8 of 10 reps of sit<>stand  transfers independently, minimal to no UE support, for improved lower extremity strengthening and transfer efficiency.    Time  4    Period  Weeks    Status  Achieved      PT SHORT TERM GOAL #3   Title  Pt will improve Berg Balance score to at least 27/56 for decreased fall risk.    Baseline  34/56 07/05/18    Time  4    Period  Weeks    Status  Achieved      PT SHORT TERM GOAL #4   Title  Pt will improve TUG score to less than or equal to 25 seconds for decreased fall risk.    Baseline  30.66 sec, 28.84 sec with cane    Time  4    Period  Weeks    Status  Not Met      PT SHORT TERM GOAL #5   Title  Pt will verbalize understanding of fall prevention in home environment.    Time  4    Period  Weeks    Status  Achieved        PT Long Term Goals - 07/29/18 0857      PT LONG TERM GOAL #1   Title  Pt will verbalize plans for continued community fitness upon d/c from PT.  TARGET 07/30/18    Time  8    Period  Weeks    Status  On-going      PT LONG TERM GOAL #2   Title  Pt will improve gait velocity to at least 1.8 ft/sec for improved gait efficiency and safety.    Baseline  Progressing, gait velocity 1.45 ft/sec with RW, 1.6 ft/sec with cane (improved from 1.04 ft/sec with RW)-07/29/2018    Time  8    Period  Weeks    Status  Not Met      PT LONG TERM GOAL #3   Title  Pt will improve Berg score to at least 40/56 for decreased fall risk.    Baseline  07/29/2018:  42/56    Time  8    Period  Weeks    Status  Achieved      PT LONG TERM GOAL #4   Title  Pt  will improve TUG score to less than or equal to 20 seconds for decreased fall risk.    Baseline  24.47 sec at best with cane (improved from 28.84 sec)-07/29/2018    Time  8    Period  Weeks    Status  Not Met      PT LONG TERM GOAL #5   Title  Pt will ambulate at least 150 ft using least restrictive assistive device, modified independently, for improved independence with gait.    Baseline  PM for distance, needs  supervision/min guard due to knee instability-awaiting AFO    Time  8    Period  Weeks    Status  Partially Met      PT LONG TERM GOAL #6   Title  Pt will negotiate at least 12 steps, one handrail, step-through pattern, modified independently, for improved stair negotiation in home.    Baseline  reports step-to pattern with supervision    Time  8    Period  Weeks    Status  Not Met            Plan - 08/02/18 2032    Clinical Impression Statement  Neuro re-education for pelvic mobility and dissociation as well as LLE stregnthening in stance, with progression to stair training with alternating step pattern today.  Also giat training with RW and with cane, using trial Thusane PLS AFO.  He demonstrates overall improved L foot clearance with AFO.  Pt is to receive his next visit (will likely look again at gait velocity wearing brace) and will continue to benefit from further gait, strength and balance training.    Rehab Potential  Good    Clinical Impairments Affecting Rehab Potential  good family support; independnet prior to CVA    PT Frequency  2x / week    PT Duration  8 weeks   plus eval   PT Treatment/Interventions  ADLs/Self Care Home Management;Electrical Stimulation;Therapeutic exercise;Therapeutic activities;Functional mobility training;Gait training;Stair training;DME Instruction;Balance training;Neuromuscular re-education;Patient/family education;Orthotic Fit/Training;Manual techniques    PT Next Visit Plan  Tall kneeling>1/2 kneeling as able; pelvic mobility (on therapy ball?), standing exercises for balance LLE as stance/LLE strengthening; gait training   week of 2/10:  Check stairs, check DGI; update POC, goals, recert   Consulted and Agree with Plan of Care  Patient;Family member/caregiver    Family Member Consulted  friend       Patient will benefit from skilled therapeutic intervention in order to improve the following deficits and impairments:  Abnormal gait, Decreased  balance, Decreased mobility, Difficulty walking, Decreased strength, Impaired tone, Postural dysfunction  Visit Diagnosis: Other abnormalities of gait and mobility  Muscle weakness (generalized)     Problem List Patient Active Problem List   Diagnosis Date Noted  . Spastic hemiplegia affecting nondominant side ([place]) 07/07/2018  . Abnormality of gait 06/09/2018  . Neuropathic pain   . Labile blood pressure   . PAF (paroxysmal atrial fibrillation) ([place])   . Hemiparesis affecting left side as late effect of stroke ([place])   . 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 Benign essential HTN   . Dyslipidemia   . Hemorrhagic stroke ([place])   . ICH (intracerebral hemorrhage) ([place]) 03/12/2018  . Prostate cancer ([place]) 01/29/2017  . 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 Diabetes ([place]) 12/14/2015  . PCP NOTES >>>>>>>>>>>>>>>>>>>>>>>>>>>>>>. 08/06/2015  . Dizziness and giddiness 01/29/2015  . Umbilical hernia 94/80/1655  . Elevated PSA, less than 10 ng/ml 04/19/2012  . Annual  physical exam 01/24/2011  . Hyperlipidemia 01/03/2010  . Essential hypertension 09/20/2007    MARRIOTT,AMY W. 08/02/2018, 8:37 PM  Frazier Butt., PT   [facility] [address] [place], [place], [address] Phone: [phone]   Fax:  [phone]  Name: Marcus Beasley MRN: [id number] Date of Birth: 04/12/1946

## 2018-08-03 ENCOUNTER — Encounter: Payer: Medicare Other | Admitting: Occupational Therapy

## 2018-08-04 ENCOUNTER — Other Ambulatory Visit: Payer: Self-pay | Admitting: Physical Medicine & Rehabilitation

## 2018-08-04 ENCOUNTER — Encounter: Payer: Self-pay | Admitting: Physical Medicine & Rehabilitation

## 2018-08-04 ENCOUNTER — Encounter: Payer: Medicare Other | Attending: Physical Medicine & Rehabilitation | Admitting: Physical Medicine & Rehabilitation

## 2018-08-04 VITALS — BP 127/86 | HR 67 | Ht 71.0 in | Wt 172.0 lb

## 2018-08-04 DIAGNOSIS — R269 Unspecified abnormalities of gait and mobility: Secondary | ICD-10-CM | POA: Diagnosis not present

## 2018-08-04 DIAGNOSIS — M792 Neuralgia and neuritis, unspecified: Secondary | ICD-10-CM | POA: Diagnosis not present

## 2018-08-04 DIAGNOSIS — G8110 Spastic hemiplegia affecting unspecified side: Secondary | ICD-10-CM | POA: Diagnosis not present

## 2018-08-04 DIAGNOSIS — I69354 Hemiplegia and hemiparesis following cerebral infarction affecting left non-dominant side: Secondary | ICD-10-CM | POA: Diagnosis not present

## 2018-08-04 DIAGNOSIS — I610 Nontraumatic intracerebral hemorrhage in hemisphere, subcortical: Secondary | ICD-10-CM | POA: Diagnosis not present

## 2018-08-04 DIAGNOSIS — I10 Essential (primary) hypertension: Secondary | ICD-10-CM | POA: Insufficient documentation

## 2018-08-04 DIAGNOSIS — G8929 Other chronic pain: Secondary | ICD-10-CM

## 2018-08-04 DIAGNOSIS — M25512 Pain in left shoulder: Secondary | ICD-10-CM

## 2018-08-04 DIAGNOSIS — E119 Type 2 diabetes mellitus without complications: Secondary | ICD-10-CM | POA: Insufficient documentation

## 2018-08-04 NOTE — Progress Notes (Addendum)
Subjective:    Patient ID: Marcus Beasley, male    DOB: [birth year], 73 y.o.   MRN: [id number]  HPI 73 year old right-handed male with history of diabetes mellitus, hypertension presents for follow up for right thalamic hemorrhage.   Last clinic visit 07/07/18.  Family supplements history. Poor historian. Since that time, patient states he continues with therapies.  He is following up with Neurology. He is currently on Cipro for UTI. He did not notice difference with Gabapentin.  No benefit with Baclofen.  Denies falls.   Pain Inventory Average Pain 6 Pain Right Now 0 My pain is constant and tingling  In the last 24 hours, has pain interfered with the following? General activity 0 Relation with others 0 Enjoyment of life 0 What TIME of day is your pain at its worst? na Sleep (in general) Fair  Pain is worse with: na Pain improves with: medication Relief from Meds: 1  Mobility walk with assistance use a walker use a wheelchair Do you have any goals in this area?  yes  Function retired  Neuro/Psych numbness tingling trouble walking  Prior Studies Any changes since last visit?  no  Physicians involved in your care Any changes since last visit?  no   Family History  Problem Relation Age of Onset  . Leukemia Mother   . Heart attack Father        MI age 14  . Heart disease Sister        age 7  . Cancer - Other Sister        type  . Heart disease Brother        age 53  . Kidney disease Brother        HD  . Diabetes Maternal Grandmother   . Prostate cancer Neg Hx   . Colon cancer Neg Hx   . Esophageal cancer Neg Hx   . Rectal cancer Neg Hx   . Stomach cancer Neg Hx    Social History   Socioeconomic History  . Marital status: Married    Spouse name: Not on file  . Number of children: 3  . Years of education: 4  . Highest education level: Not on file  Occupational History  . 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 Financial resource strain: Not on file  . Food insecurity:    Worry: Not on file    Inability: Not on file  . Transportation needs:    Medical: Not on file    Non-medical: Not on file  Tobacco Use  . Smoking status: Never Smoker  . Smokeless tobacco: Never Used  Substance and Sexual Activity  . Alcohol use: Yes    Comment: occ. beer  . Drug use: No  . Sexual activity: Yes    Partners: Female  Lifestyle  . Physical activity:    Days per week: Not on file    Minutes per session: Not on file  . Stress: Not on file  Relationships  . Social connections:    Talks on phone: Not on file    Gets together: Not on file    Attends religious service: Not on file    Active member of club or organization: Not on file    Attends meetings of clubs or organizations: Not on file    Relationship status: Not on file  Other Topics Concern  . 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 Married - '69. 2 dtrs , 1 son - homicide. 5 grandchildren.     Past Surgical History:  Procedure Laterality Date  . ABCESS DRAINAGE     abdomen- 26 day hospitalization 1968  . COLONOSCOPY  2016  . HERNIA REPAIR  summer '11   umbilical, dr Ninfa Linden   . POLYPECTOMY    . PROSTATE BIOPSY  06-2013 , 07-2017   (-), (-)   Past Medical History:  Diagnosis Date  . BPH (benign prostatic hyperplasia)    (-) Bx 2015  . Diabetes mellitus without complication ([place])   . Elevated PSA    Prostate Bx in 06/2013 was benign  . HTN (hypertension)   . Hyperlipidemia   . Macular degeneration, age related    BP 127/86   Pulse 67   Ht 5\' 11"  (1.803 m)   Wt 172 lb (78 kg)   SpO2 97%   BMI 23.99 kg/m   Opioid Risk Score:   Fall Risk Score:  `1  Depression screen PHQ 2/9  Depression screen [facility] 2/9 04/28/2018 08/25/2017 04/14/2016 12/14/2015 08/06/2015 07/31/2014  Decreased Interest 0 0 0 0 0 0  Down, Depressed, Hopeless 0 0 0 0 0 0  PHQ - 2 Score 0 0 0 0 0 0     Review  of Systems  HENT: Negative.   Eyes: Negative.   Respiratory: Negative.   Cardiovascular: Negative.   Gastrointestinal: Negative.   Endocrine: Negative.   Genitourinary: Negative.   Musculoskeletal: Positive for gait problem.  Skin: Negative.   Allergic/Immunologic: Negative.   Neurological: Positive for numbness.  Hematological: Negative.   Psychiatric/Behavioral: Negative.   All other systems reviewed and are negative.     Objective:   Physical Exam Constitutional: No distress . Vital signs reviewed. HENT: Normocephalic.  Atraumatic. Eyes: EOMI. No discharge. Cardiovascular:  RRR. No JVD. Respiratory: CTA  bilaterally. Normal effort. GI: BS +. Non-distended. Musc: left hand edema, stable Neurological: He is alert and oriented Follows basic commands  Motor:  LUE: Shoulder abduction 4/5, elbow flex 4+/5, elbow extension 4/5, hand grip 3+/5 with apraxia LLE: HF, KE, ADF 4+/5 Mas: left elbow flexors: 1+/4 series of baclofen helping with that Skin: Skin is warm and dry.  Psychiatric: He has a normal mood and affect. His behavior is normal. Thought content normal.     Assessment & Plan:  73 year old right-handed male with history of diabetes mellitus, hypertension presents for follow up for right thalamic hemorrhage.   1. Left-sided weakness secondary to right thalamic hemorrhage secondary to hypertensive crisis now with spasticity  Cont therapies  Cont follow up with Neurology  Will consider Botulinum toxin   2. Pain Management:   No benefit with Gabapentin  Tylenol as needed  Wean Gabapentin  Will consider Elavil 10 qhs, pt does not want at present  Cont Baclofen 20 TID, pt does not want to increase at present  D/ced Tramadol  Encouraged ROM  3. Gait abnormality  Cont therapies  Cont walker/wheelchair for safety  4. Post stroke shoulder pain  Will schedule for subacromial injection  >25 minutes spent with patient and family with >20 minutes in counseling  regarding pain, spasticity

## 2018-08-05 ENCOUNTER — Ambulatory Visit: Payer: Medicare Other | Admitting: Physical Therapy

## 2018-08-05 ENCOUNTER — Other Ambulatory Visit: Payer: Self-pay | Admitting: Physical Medicine & Rehabilitation

## 2018-08-05 ENCOUNTER — Encounter: Payer: Self-pay | Admitting: Physical Therapy

## 2018-08-05 ENCOUNTER — Other Ambulatory Visit: Payer: Self-pay | Admitting: Adult Health

## 2018-08-05 DIAGNOSIS — M6281 Muscle weakness (generalized): Secondary | ICD-10-CM

## 2018-08-05 DIAGNOSIS — I69254 Hemiplegia and hemiparesis following other nontraumatic intracranial hemorrhage affecting left non-dominant side: Secondary | ICD-10-CM | POA: Diagnosis not present

## 2018-08-05 DIAGNOSIS — R2681 Unsteadiness on feet: Secondary | ICD-10-CM | POA: Diagnosis not present

## 2018-08-05 DIAGNOSIS — R2689 Other abnormalities of gait and mobility: Secondary | ICD-10-CM

## 2018-08-05 DIAGNOSIS — R278 Other lack of coordination: Secondary | ICD-10-CM | POA: Diagnosis not present

## 2018-08-05 DIAGNOSIS — I10 Essential (primary) hypertension: Secondary | ICD-10-CM

## 2018-08-05 DIAGNOSIS — M21372 Foot drop, left foot: Secondary | ICD-10-CM | POA: Diagnosis not present

## 2018-08-05 NOTE — Telephone Encounter (Signed)
He is going to wean the medication, no refill required.  Thanks.

## 2018-08-05 NOTE — Telephone Encounter (Signed)
Recieved electronic medication refill request for gabapentin.  Last note states:   2. Pain Management:              No benefit with Gabapentin             Tylenol as needed             Wean Gabapentin  Not sure which wean down schedule will work best for him.  Please advise.

## 2018-08-05 NOTE — Therapy (Signed)
[facility] [address] [place], [place], [address] Phone: [phone]   Fax:  [phone]  Physical Therapy Treatment  Patient Details  Name: Marcus Beasley MRN: [id number] Date of Birth: 01/29/1946 No data recorded  Encounter Date: 08/05/2018  PT End of Session - 08/05/18 1803    Visit Number  16    Number of Visits  32    Date for PT Re-Evaluation  11/03/18    Authorization Type  [employer] Medicare; will need 10th visit progress notes    PT Start Time  0848    PT Stop Time  0933    PT Time Calculation (min)  45 min    Equipment Utilized During Treatment  Gait belt    Activity Tolerance  Patient tolerated treatment well    Behavior During Therapy  WFL for tasks assessed/performed       Past Medical History:  Diagnosis Date  . BPH (benign prostatic hyperplasia)    (-) Bx 2015  . Diabetes mellitus without complication ([place])   . Elevated PSA    Prostate Bx in 06/2013 was benign  . HTN (hypertension)   . Hyperlipidemia   . Macular degeneration, age related     Past Surgical History:  Procedure Laterality Date  . ABCESS DRAINAGE     abdomen- 26 day hospitalization 1968  . COLONOSCOPY  2016  . HERNIA REPAIR  summer '11   umbilical, dr Ninfa Linden   . POLYPECTOMY    . PROSTATE BIOPSY  06-2013 , 07-2017   (-), (-)    There were no vitals filed for this visit.  Subjective Assessment - 08/05/18 0850    Subjective  No pain, no changes, just a red eye-the doctor saw it yesterday, not too concerned.    Patient is accompained by:  Family member   friend   Limitations  Walking;Standing    Patient Stated Goals  Pt wants to be able to walk again.  Wants to be able to use L hand and arm again; wants to drive, do yardwork.    Currently in Pain?  No/denies                       [facility] Adult PT Treatment/Exercise - 08/05/18 0001      Ambulation/Gait   Ambulation/Gait  Yes    Ambulation/Gait Assistance  4:  Min guard    Ambulation Distance (Feet)  115 Feet   x 2, 230 ft. 200 ft   Assistive device  Rolling walker;Straight cane   cane with rubber quad tip, [place]ane PLS AFO   Gait Pattern  Step-through pattern;Decreased step length - left;Decreased stance time - left;Decreased dorsiflexion - left;Left flexed knee in stance;Trunk rotated posteriorly on left;Narrow base of support    Ambulation Surface  Level;Indoor    Gait velocity   24.05 sec with cane= 1.36 f/tsec    Stairs  Yes    Stairs Assistance  4: Min guard    Stair Management Technique  One rail Right;Alternating pattern;Forwards    Number of Stairs  4    Height of Stairs  6    Gait Comments  Orthotist present to fit/deliver Thusane PLS AFO.  Discussed with pt and wife wear time, skin checks, and how to don/doff AFO and use in other shoes.      Standardized Balance Assessment   Standardized Balance Assessment  Dynamic Gait Index      Dynamic Gait Index   Level  Surface  Moderate Impairment    Change in Gait Speed  Moderate Impairment    Gait with Horizontal Head Turns  Moderate Impairment    Gait with Vertical Head Turns  Moderate Impairment    Gait and Pivot Turn  Mild Impairment    Step Over Obstacle  Moderate Impairment    Step Around Obstacles  Moderate Impairment    Steps  Moderate Impairment    Total Score  9       Once AFO donned, pt performs sit<>stand with cues for foot placement, cues for increased weightshift to LLE 3 reps, cues for terminal knee extension in standing.      PT Education - 08/05/18 1803    Education Details  AFO don/doffing, wear time, skin checks    Person(s) Educated  Patient;Spouse    Methods  Explanation;Demonstration    Comprehension  Verbalized understanding       PT Short Term Goals - 07/08/18 0950      PT SHORT TERM GOAL #1   Title  Pt will be independent with HEP for improved strength, balance, gait.  TARGET 07/02/18 (may be modified due to scheduling around holidays)    Time  4     Period  Weeks    Status  Achieved      PT SHORT TERM GOAL #2   Title  Pt will perform at least 8 of 10 reps of sit<>stand transfers independently, minimal to no UE support, for improved lower extremity strengthening and transfer efficiency.    Time  4    Period  Weeks    Status  Achieved      PT SHORT TERM GOAL #3   Title  Pt will improve Berg Balance score to at least 27/56 for decreased fall risk.    Baseline  34/56 07/05/18    Time  4    Period  Weeks    Status  Achieved      PT SHORT TERM GOAL #4   Title  Pt will improve TUG score to less than or equal to 25 seconds for decreased fall risk.    Baseline  30.66 sec, 28.84 sec with cane    Time  4    Period  Weeks    Status  Not Met      PT SHORT TERM GOAL #5   Title  Pt will verbalize understanding of fall prevention in home environment.    Time  4    Period  Weeks    Status  Achieved        PT Long Term Goals - 07/29/18 0857      PT LONG TERM GOAL #1   Title  Pt will verbalize plans for continued community fitness upon d/c from PT.  TARGET 07/30/18    Time  8    Period  Weeks    Status  On-going      PT LONG TERM GOAL #2   Title  Pt will improve gait velocity to at least 1.8 ft/sec for improved gait efficiency and safety.    Baseline  Progressing, gait velocity 1.45 ft/sec with RW, 1.6 ft/sec with cane (improved from 1.04 ft/sec with RW)-07/29/2018    Time  8    Period  Weeks    Status  Not Met      PT LONG TERM GOAL #3   Title  Pt will improve Berg score to at least 40/56 for decreased fall risk.    Baseline  07/29/2018:  42/56    Time  8    Period  Weeks    Status  Achieved      PT LONG TERM GOAL #4   Title  Pt will improve TUG score to less than or equal to 20 seconds for decreased fall risk.    Baseline  24.47 sec at best with cane (improved from 28.84 sec)-07/29/2018    Time  8    Period  Weeks    Status  Not Met      PT LONG TERM GOAL #5   Title  Pt will ambulate at least 150 ft using least restrictive  assistive device, modified independently, for improved independence with gait.    Baseline  PM for distance, needs supervision/min guard due to knee instability-awaiting AFO    Time  8    Period  Weeks    Status  Partially Met      PT LONG TERM GOAL #6   Title  Pt will negotiate at least 12 steps, one handrail, step-through pattern, modified independently, for improved stair negotiation in home.    Baseline  reports step-to pattern with supervision    Time  8    Period  Weeks    Status  Not Met            Plan - 08/05/18 1805    Clinical Impression Statement  Orthotist present today for delivery/fitting of Thusane PLS AFO.  Utilized AFO with gait training with RW and cane.  Pt continues to need cues for hip flexion for initial foot clearance and for increased LLE stance time for activation through LLE, but pt continues to improve with no episodes of buckling of L knee.  Pt is able to perform stair negoitation step over step, with min guard assistance.  Pt will continue to benefit from skilled PT to further address balance, gait and strengthening for improved functional moiblity and independence.    Rehab Potential  Good    Clinical Impairments Affecting Rehab Potential  good family support; independnet prior to CVA    PT Frequency  2x / week    PT Duration  8 weeks   plus eval   PT Treatment/Interventions  ADLs/Self Care Home Management;Electrical Stimulation;Therapeutic exercise;Therapeutic activities;Functional mobility training;Gait training;Stair training;DME Instruction;Balance training;Neuromuscular re-education;Patient/family education;Orthotic Fit/Training;Manual techniques    PT Next Visit Plan  Tall kneeling>1/2 kneeling as able; pelvic mobility (on therapy ball?), standing exercises for balance LLE as stance/LLE strengthening; gait training with new AFO    Consulted and Agree with Plan of Care  Patient;Family member/caregiver    Family Member Consulted  wife       Patient  will benefit from skilled therapeutic intervention in order to improve the following deficits and impairments:  Abnormal gait, Decreased balance, Decreased mobility, Difficulty walking, Decreased strength, Impaired tone, Postural dysfunction  Visit Diagnosis: Other abnormalities of gait and mobility  Unsteadiness on feet  Muscle weakness (generalized)     Problem List Patient Active Problem List   Diagnosis Date Noted  . Spastic hemiplegia affecting nondominant side ([place]) 07/07/2018  . Abnormality of gait 06/09/2018  . Neuropathic pain   . Labile blood pressure   . PAF (paroxysmal atrial fibrillation) ([place])   . Hemiparesis affecting left side as late effect of stroke ([place])   . Thalamic hemorrhage ([place]) 03/17/2018  . Benign essential HTN   . Dyslipidemia   . Hemorrhagic stroke ([place])   . ICH (intracerebral hemorrhage) ([place]) 03/12/2018  . Prostate cancer ([place]) 01/29/2017  .  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 Diabetes ([place]) 12/14/2015  . PCP NOTES >>>>>>>>>>>>>>>>>>>>>>>>>>>>>>. 08/06/2015  . Dizziness and giddiness 01/29/2015  . Umbilical hernia 09/32/6712  . Elevated PSA, less than 10 ng/ml 04/19/2012  . Annual physical exam 01/24/2011  . Hyperlipidemia 01/03/2010  . Essential hypertension 09/20/2007    Marcus Muns W. 08/05/2018, 6:10 PM  Frazier Butt., PT   [facility] [address] [place], [place], [address] Phone: [phone]   Fax:  [phone]  Name: Marcus Beasley MRN: [id number] Date of Birth: 1945-12-03   New goals per recert: PT Short Term Goals - 08/05/18 1812      PT SHORT TERM GOAL #1   Title  Pt will be independent with progression of HEP for improved strength, balance, gait.  TARGET 09/03/2018    Time  4    Period  Weeks    Status  New    Target Date  09/03/18      PT SHORT TERM GOAL #2   Title  Pt will improve DGI score to at least 12/24 for decreased fall  risk.    Baseline  9/24 08/05/2018    Time  4    Period  Weeks    Status  New    Target Date  09/03/18      PT SHORT TERM GOAL #3   Title  Pt will improve Berg Balance score to at least 47/56 for decreased fall risk.    Baseline  42/56 07/29/2018    Time  4    Period  Weeks    Status  New    Target Date  09/03/18      PT SHORT TERM GOAL #4   Title  Pt will improve TUG score to less than or equal to 20 seconds for decreased fall risk.    Baseline  24.47 sec cane 07/29/2018    Time  4    Period  Weeks    Status  New    Target Date  09/03/18      PT SHORT TERM GOAL #5   Title  Pt will improve gait velocity to at least 1.8 ft/sec with cane, for improved gait efficiency/decreased fall risk.    Time  4    Period  Weeks    Status  New    Target Date  09/03/18        PT Long Term Goals - 08/05/18 1814      PT LONG TERM GOAL #1   Title  Pt will verbalize plans for continued community fitness upon d/c from PT.  TARGET 10/01/2018    Time  8    Period  Weeks    Status  On-going    Target Date  10/01/18      PT LONG TERM GOAL #2   Title  Pt will improve gait velocity to at least 2.2 ft/sec for improved gait efficiency and safety.    Baseline  1.6 ft/sec cane 07/29/2018    Time  8    Period  Weeks    Status  New    Target Date  10/01/18      PT LONG TERM GOAL #3   Title  Pt will improve DGI score to at least 17/24 for decreased fall risk    Baseline  9/24 08/05/2018    Time  8    Period  Weeks    Status  New    Target Date  10/01/18      PT LONG TERM GOAL #4   Title  Pt will improve TUG score to less than or equal to 15 seconds for decreased fall risk.    Baseline  24.47 sec at best with cane (improved from 28.84 sec)-07/29/2018    Time  8    Period  Weeks    Status  New    Target Date  10/01/18      PT LONG TERM GOAL #5   Title  Pt will ambulate at least 5000 ft using cane. modified independently, for improved independence with gait on indoor and outdoor surfaces     Baseline       Time  8    Period  Weeks    Status  New    Target Date  10/01/18      PT LONG TERM GOAL #6   Title  Pt will negotiate at least 12 steps, one handrail, step-through pattern, modified independently, for improved stair negotiation in home.    Baseline  step through pattern with min guard 08/05/2018    Time  8    Period  Weeks    Status  New    Target Date  10/01/18      Mady Haagensen, PT 08/05/18 6:19 PM Phone: [phone] Fax: [phone]

## 2018-08-10 ENCOUNTER — Ambulatory Visit: Payer: Medicare Other | Admitting: Occupational Therapy

## 2018-08-10 ENCOUNTER — Encounter: Payer: Self-pay | Admitting: Physical Therapy

## 2018-08-10 ENCOUNTER — Ambulatory Visit: Payer: Medicare Other | Admitting: Physical Therapy

## 2018-08-10 DIAGNOSIS — I69254 Hemiplegia and hemiparesis following other nontraumatic intracranial hemorrhage affecting left non-dominant side: Secondary | ICD-10-CM | POA: Diagnosis not present

## 2018-08-10 DIAGNOSIS — R2689 Other abnormalities of gait and mobility: Secondary | ICD-10-CM | POA: Diagnosis not present

## 2018-08-10 DIAGNOSIS — R278 Other lack of coordination: Secondary | ICD-10-CM

## 2018-08-10 DIAGNOSIS — M6281 Muscle weakness (generalized): Secondary | ICD-10-CM

## 2018-08-10 DIAGNOSIS — R2681 Unsteadiness on feet: Secondary | ICD-10-CM

## 2018-08-10 NOTE — Therapy (Signed)
[facility] [address] [place], [place], [address] Phone: [phone]   Fax:  [phone]  Occupational Therapy Treatment  Patient Details  Name: Marcus Beasley MRN: [id number] Date of Birth: 06-29-1945 Referring Provider (OT): Venancio Poisson   Encounter Date: 08/10/2018  OT End of Session - 08/10/18 1418    Visit Number  13    Number of Visits  17    Date for OT Re-Evaluation  08/03/18    Authorization Type  [employer] MCR    Authorization - Visit Number  13    Authorization - Number of Visits  20    OT Start Time  8250    OT Stop Time  1233    OT Time Calculation (min)  48 min    Activity Tolerance  Patient tolerated treatment well    Behavior During Therapy  [facility] for tasks assessed/performed       Past Medical History:  Diagnosis Date  . BPH (benign prostatic hyperplasia)    (-) Bx 2015  . Diabetes mellitus without complication ([place])   . Elevated PSA    Prostate Bx in 06/2013 was benign  . HTN (hypertension)   . Hyperlipidemia   . Macular degeneration, age related     Past Surgical History:  Procedure Laterality Date  . ABCESS DRAINAGE     abdomen- 26 day hospitalization 1968  . COLONOSCOPY  2016  . HERNIA REPAIR  summer '11   umbilical, dr Ninfa Linden   . POLYPECTOMY    . PROSTATE BIOPSY  06-2013 , 07-2017   (-), (-)    There were no vitals filed for this visit.  Subjective Assessment - 08/10/18 1418    Subjective   My shoulder feels tight    Pertinent History  Rt thalamic hemorrhage 03/12/18. PMH: HTN, HLD, DM, A-fib    Limitations  Fall risk    Patient Stated Goals  get more function in my Lt arm and hand    Currently in Pain?  No/denies       Supine: BUE sh flexion using ball for increased control and awareness LUE, w/ min assist and mod cueing needed. Pt fatigues quickly, cues to extend Lt elbow, and to prevent sh hiking.  Seated: AA/ROM LUE in mid to high level flexion w/ UE Ranger and cues  to lead/push w/ hand. Pt improved w/ repetition but fatigues quickly and cannot maintain position long.  Worked on P/ROM Lt hand in full composite flexion and extension. Pt issued another yellow putty to combine with other putty to make easier to squeeze.  Worked on hand function: attempted to use highlighter to roll down in palm and back up, however pt will need slightly larger diameter to be successful. Also attempted using stress balls for isolated finger movement/translation however pt unable to do ulnar side. Attempted beginning in hand manipulation (fingertip to/from palm) w/ checkers, but d/t limited ROM unable to hold checkers in last 3 digits while getting another one.                       OT Short Term Goals - 07/08/18 0947      OT SHORT TERM GOAL #1   Title  Pt independent with LUE HEP for neuro re-education - 07/03/18    Time  4    Period  Weeks    Status  Achieved      OT SHORT TERM GOAL #2   Title  Pt independent  with coordination and putty HEP Lt hand    Time  4    Period  Weeks    Status  Achieved      OT SHORT TERM GOAL #3   Title  Pt to verbalize understanding with safety considerations LUE d/t lack of sensation    Time  4    Period  Weeks    Status  Achieved      OT SHORT TERM GOAL #4   Title  Pt to cut food mod I level with A/E prn    Time  4    Period  Weeks    Status  Achieved   IN CLINIC     OT SHORT TERM GOAL #5   Title  Pt to consistently don/doff shirt I'ly and don/doff socks I'ly w/ task modifications prn    Time  4    Period  Weeks    Status  Achieved   IN CLINIC     OT SHORT TERM GOAL #6   Title  Pt to verbalize understanding and consistently demo correct reaching pattern LUE to prevent pain Lt shoulder    Time  4    Period  Weeks    Status  On-going      OT SHORT TERM GOAL #7   Title  Pt to improve coordination Lt hand as evidenced by performing 9 hole peg test in under 2 min.     Baseline  eval - placed 5 pegs in 2 min.     Time  4    Period  Weeks    Status  Not Met        OT Long Term Goals - 06/02/18 1144      OT LONG TERM GOAL #1   Title  Pt to perform functional mid to high level reaching LUE w/ only min compensations and no pain - 08/03/18    Time  8    Period  Weeks    Status  New      OT LONG TERM GOAL #2   Title  Pt to improve Lt hand coordination as evidenced by performing 9 hole peg test in 90 sec. or under    Time  8    Period  Weeks    Status  New      OT LONG TERM GOAL #3   Title  Pt to improve grip strength Lt hand to 40 lbs or greater to assist w/ opening jars/containers    Baseline  eval: 20 lbs    Time  8    Period  Weeks    Status  New      OT LONG TERM GOAL #4   Title  Pt to demo full composite flexion Lt hand for coordination/grasping tasks    Baseline  eval: 75%    Time  8    Period  Weeks    Status  New      OT LONG TERM GOAL #5   Title  Pt to be able to tie shoe laces and hook/unhook buttons using Lt hand as assist    Time  8    Period  Weeks    Status  New      Long Term Additional Goals   Additional Long Term Goals  Yes      OT LONG TERM GOAL #6   Title  Pt to return to simple cooking and cleaning tasks from standing level w/o LOB using countertop support prn  Time  8    Period  Weeks    Status  New      OT LONG TERM GOAL #7   Title  Pt to return to financial management tasks w/ supervision prn    Time  8    Period  Weeks    Status  New            Plan - 08/10/18 1419    Clinical Impression Statement  Pt w/ decreased UE endurance LUE to maintain sh flexion w/ elbow extension. Pt's OT schedule delayed d/t missed weeks.     Occupational Profile and client history currently impacting functional performance  PMH; HTN, HLD, DM, A-fib. Current deficits impeding pt's ability to perform ADLS, IADLS, and role as husband, friend, and leisure participation    Occupational performance deficits (Please refer to evaluation for details):   ADL's;IADL's;Leisure    Rehab Potential  Good    OT Frequency  2x / week    OT Duration  8 weeks    OT Treatment/Interventions  Self-care/ADL training;Moist Heat;DME and/or AE instruction;Splinting;Therapeutic activities;Psychosocial skills training;Aquatic Therapy;Therapeutic exercise;Cognitive remediation/compensation;Coping strategies training;Neuromuscular education;Functional Mobility Training;Passive range of motion;Visual/perceptual remediation/compensation;Manual Therapy;Patient/family education;Electrical Stimulation    Plan  renewal next visit for 2x/wk for 8 more weeks       Patient will benefit from skilled therapeutic intervention in order to improve the following deficits and impairments:  Decreased coordination, Decreased range of motion, Difficulty walking, Improper body mechanics, Decreased safety awareness, Impaired sensation, Impaired tone, Decreased activity tolerance, Decreased knowledge of precautions, Impaired UE functional use, Pain, Decreased knowledge of use of DME, Decreased balance, Decreased cognition, Decreased mobility, Decreased strength  Visit Diagnosis: 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 Spastic hemiplegia affecting nondominant side ([place]) 07/07/2018  . Abnormality of gait 06/09/2018  . Neuropathic pain   . Labile blood pressure   . PAF (paroxysmal atrial fibrillation) ([place])   . Hemiparesis affecting left side as late effect of stroke ([place])   . Thalamic hemorrhage ([place]) 03/17/2018  . Benign essential HTN   . Dyslipidemia   . Hemorrhagic stroke ([place])   . ICH (intracerebral hemorrhage) ([place]) 03/12/2018  . Prostate cancer ([place]) 01/29/2017  . 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 Diabetes ([place]) 12/14/2015  . PCP NOTES  >>>>>>>>>>>>>>>>>>>>>>>>>>>>>>. 08/06/2015  . Dizziness and giddiness 01/29/2015  . Umbilical hernia 57/84/6962  . Elevated PSA, less than 10 ng/ml 04/19/2012  . Annual physical exam 01/24/2011  . Hyperlipidemia 01/03/2010  . Essential hypertension 09/20/2007    Carey Bullocks, OTR/L 08/10/2018, 2:21 PM  [facility] [address] [place], [place], [address] Phone: [phone]   Fax:  [phone]  Name: Marcus Beasley MRN: [id number] Date of Birth: 1946/06/12

## 2018-08-11 NOTE — Patient Instructions (Signed)
Added to [employer] HEP:  Terminal knee extension LLE, green theraband, working up to 2 sets of 10 reps

## 2018-08-11 NOTE — Therapy (Signed)
[facility] [address] [place], [place], [address] Phone: [phone]   Fax:  [phone]  Physical Therapy Treatment  Patient Details  Name: Marcus Beasley MRN: [id number] Date of Birth: 1945-12-18 No data recorded  Encounter Date: 08/10/2018  PT End of Session - 08/11/18 1203    Visit Number  17    Number of Visits  32    Date for PT Re-Evaluation  11/03/18    Authorization Type  [employer] Medicare; will need 10th visit progress notes    PT Start Time  1101    PT Stop Time  1145    PT Time Calculation (min)  44 min    Equipment Utilized During Treatment  Gait belt   wearing his [place] PLS AFO   Activity Tolerance  Patient tolerated treatment well    Behavior During Therapy  WFL for tasks assessed/performed       Past Medical History:  Diagnosis Date  . BPH (benign prostatic hyperplasia)    (-) Bx 2015  . Diabetes mellitus without complication ([place])   . Elevated PSA    Prostate Bx in 06/2013 was benign  . HTN (hypertension)   . Hyperlipidemia   . Macular degeneration, age related     Past Surgical History:  Procedure Laterality Date  . ABCESS DRAINAGE     abdomen- 26 day hospitalization 1968  . COLONOSCOPY  2016  . HERNIA REPAIR  summer '11   umbilical, dr Ninfa Linden   . POLYPECTOMY    . PROSTATE BIOPSY  06-2013 , 07-2017   (-), (-)    There were no vitals filed for this visit.  Subjective Assessment - 08/10/18 1108    Subjective  Just trying to get used to the brace.  Trying to trust that left leg.    Patient is accompained by:  Family member   friend   Limitations  Walking;Standing    Patient Stated Goals  Pt wants to be able to walk again.  Wants to be able to use L hand and arm again; wants to drive, do yardwork.    Currently in Pain?  No/denies                       [facility] Adult PT Treatment/Exercise - 08/11/18 1153      Ambulation/Gait   Ambulation/Gait  Yes    Ambulation/Gait Assistance  4: Min guard    Ambulation Distance (Feet)  100 Feet   RW, then 25 ft x 2 with cane   Assistive device  Rolling walker;Straight cane   cane with rubber quad tip   Gait Pattern  Step-through pattern;Decreased step length - left;Decreased stance time - left;Decreased dorsiflexion - left;Left flexed knee in stance;Trunk rotated posteriorly on left;Narrow base of support    Ambulation Surface  Level;Indoor    Gait Comments  Reviewed orthotic wear/care (pt states he's not wearing more than 15 minutes at time, takes it off when resting).  Discussed again wearing schedule, with trying to work up to wearing brace all day.  Advised pt to take off after returning home after therapies today (will have worn 4-5 hrs) and perform skin checks, then put on again for remainder of day as long as skin checks okay.      Neuro Re-ed    Neuro Re-ed Details   Seated edge of mat with work on pelvic mobility and dissociation and flexibility:  lateral pelvic tilts x 10 reps; scooting edge  of mat, one hip at a time forward and back, 5 reps.        Knee/Hip Exercises: Standing   Terminal Knee Extension  Strengthening;Left;2 sets;10 reps;Theraband   green band    With TKE exercises, cues for weightbearing, positioning of LLE to avoid raising L heel from ground.     Balance Exercises - 08/11/18 1157      Balance Exercises: Standing   Retro Gait  Upper extremity support;5 reps   fwd/back in parallel bars; cues for L foot placement   Sidestepping  Upper extremity support;3 reps   in parallel bars   Marching Limitations  LLE as stance with RLE hip/knee flexion, 2 sets x 10 reps, then alternating legs marching in place x 10 reps    Other Standing Exercises  Wide BOS lateral weight shifting, with mirror as visual cues, for pt to increased LLE weightbearing/weigthshifting.  Cues through gluts and quads for upright LLE posture.  Cues for pt in sitting to decrease LLE externally rotated position.         PT Education - 08/11/18 1203    Education Details  Review of AFO wearing time and progression, skin checks , HEP to include LLE TKE   Person(s) Educated  Patient;Other (comment)   Friend   Methods  Explanation , Demo, VCs, handout   Comprehension  Verbalized understanding, demo, requires VCs      PT Short Term Goals - 08/05/18 1812      PT SHORT TERM GOAL #1   Title  Pt will be independent with progression of HEP for improved strength, balance, gait.  TARGET 09/03/2018    Time  4    Period  Weeks    Status  New    Target Date  09/03/18      PT SHORT TERM GOAL #2   Title  Pt will improve DGI score to at least 12/24 for decreased fall risk.    Baseline  9/24 08/05/2018    Time  4    Period  Weeks    Status  New    Target Date  09/03/18      PT SHORT TERM GOAL #3   Title  Pt will improve Berg Balance score to at least 47/56 for decreased fall risk.    Baseline  42/56 07/29/2018    Time  4    Period  Weeks    Status  New    Target Date  09/03/18      PT SHORT TERM GOAL #4   Title  Pt will improve TUG score to less than or equal to 20 seconds for decreased fall risk.    Baseline  24.47 sec cane 07/29/2018    Time  4    Period  Weeks    Status  New    Target Date  09/03/18      PT SHORT TERM GOAL #5   Title  Pt will improve gait velocity to at least 1.8 ft/sec with cane, for improved gait efficiency/decreased fall risk.    Time  4    Period  Weeks    Status  New    Target Date  09/03/18        PT Long Term Goals - 08/05/18 1814      PT LONG TERM GOAL #1   Title  Pt will verbalize plans for continued community fitness upon d/c from PT.  TARGET 10/01/2018    Time  8    Period  Weeks  Status  On-going    Target Date  10/01/18      PT LONG TERM GOAL #2   Title  Pt will improve gait velocity to at least 2.2 ft/sec for improved gait efficiency and safety.    Baseline  1.6 ft/sec cane 07/29/2018    Time  8    Period  Weeks    Status  New    Target Date   10/01/18      PT LONG TERM GOAL #3   Title  Pt will improve DGI score to at least 17/24 for decreased fall risk    Baseline  9/24 08/05/2018    Time  8    Period  Weeks    Status  New    Target Date  10/01/18      PT LONG TERM GOAL #4   Title  Pt will improve TUG score to less than or equal to 15 seconds for decreased fall risk.    Baseline  24.47 sec at best with cane (improved from 28.84 sec)-07/29/2018    Time  8    Period  Weeks    Status  New    Target Date  10/01/18      PT LONG TERM GOAL #5   Title  Pt will ambulate at least 5000 ft using cane. modified independently, for improved independence with gait on indoor and outdoor surfaces    Baseline       Time  8    Period  Weeks    Status  New    Target Date  10/01/18      PT LONG TERM GOAL #6   Title  Pt will negotiate at least 12 steps, one handrail, step-through pattern, modified independently, for improved stair negotiation in home.    Baseline  step through pattern with min guard 08/05/2018    Time  8    Period  Weeks    Status  New    Target Date  10/01/18            Plan - 08/11/18 1204    Clinical Impression Statement  Pt continues to seem fearful of fully weightshifting to LLE in standing and gait, and continues to need work on increased WB/weightshifting to LLE.  He continues to require verbal and tactile cues for full knee/hip extension when LLE is stance leg.  Added exercises for terminal knee extension using green band.  Pt will conitnue to benefit from skilled PT to address balance, NMR, strength and progression of gait training.    Rehab Potential  Good    Clinical Impairments Affecting Rehab Potential  good family support; independnet prior to CVA    PT Frequency  2x / week    PT Duration  8 weeks   plus eval   PT Treatment/Interventions  ADLs/Self Care Home Management;Electrical Stimulation;Therapeutic exercise;Therapeutic activities;Functional mobility training;Gait training;Stair training;DME  Instruction;Balance training;Neuromuscular re-education;Patient/family education;Orthotic Fit/Training;Manual techniques    PT Next Visit Plan  Tall kneeling, 1/2 kneeling, ?prone hamstring/gluts strength; standing exercises for balance LLE as stance/LLE strengthening; gait training with new AFO    Consulted and Agree with Plan of Care  Patient;Family member/caregiver    Family Member Consulted  wife       Patient will benefit from skilled therapeutic intervention in order to improve the following deficits and impairments:  Abnormal gait, Decreased balance, Decreased mobility, Difficulty walking, Decreased strength, Impaired tone, Postural dysfunction  Visit Diagnosis: Muscle weakness (generalized)  Unsteadiness on feet  Other  abnormalities of gait and mobility     Problem List Patient Active Problem List   Diagnosis Date Noted  . Spastic hemiplegia affecting nondominant side ([place]) 07/07/2018  . Abnormality of gait 06/09/2018  . Neuropathic pain   . Labile blood pressure   . PAF (paroxysmal atrial fibrillation) ([place])   . Hemiparesis affecting left side as late effect of stroke ([place])   . Thalamic hemorrhage ([place]) 03/17/2018  . Benign essential HTN   . Dyslipidemia   . Hemorrhagic stroke ([place])   . ICH (intracerebral hemorrhage) ([place]) 03/12/2018  . Prostate cancer ([place]) 01/29/2017  . 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 Diabetes ([place]) 12/14/2015  . PCP NOTES >>>>>>>>>>>>>>>>>>>>>>>>>>>>>>. 08/06/2015  . Dizziness and giddiness 01/29/2015  . Umbilical hernia 63/81/7711  . Elevated PSA, less than 10 ng/ml 04/19/2012  . Annual physical exam 01/24/2011  . Hyperlipidemia 01/03/2010  . Essential hypertension 09/20/2007    Shellby Schlink W. 08/11/2018, 12:10 PM  Frazier Butt., PT   [facility] [address] [place], [place], [address] Phone: [phone]   Fax:  [phone]  Name: Marcus Beasley MRN: [id number] Date of Birth: 14-Oct-1945

## 2018-08-13 ENCOUNTER — Ambulatory Visit: Payer: Medicare Other | Admitting: Physical Therapy

## 2018-08-13 ENCOUNTER — Encounter: Payer: Self-pay | Admitting: Physical Therapy

## 2018-08-13 DIAGNOSIS — R2681 Unsteadiness on feet: Secondary | ICD-10-CM

## 2018-08-13 DIAGNOSIS — M6281 Muscle weakness (generalized): Secondary | ICD-10-CM

## 2018-08-13 DIAGNOSIS — R2689 Other abnormalities of gait and mobility: Secondary | ICD-10-CM | POA: Diagnosis not present

## 2018-08-13 DIAGNOSIS — R278 Other lack of coordination: Secondary | ICD-10-CM | POA: Diagnosis not present

## 2018-08-13 DIAGNOSIS — I69254 Hemiplegia and hemiparesis following other nontraumatic intracranial hemorrhage affecting left non-dominant side: Secondary | ICD-10-CM | POA: Diagnosis not present

## 2018-08-13 NOTE — Therapy (Signed)
[facility] [address] [place], [place], [address] Phone: [phone]   Fax:  [phone]  Physical Therapy Treatment  Patient Details  Name: Marcus Beasley MRN: [id number] Date of Birth: January 19, 1946 No data recorded  Encounter Date: 08/13/2018  PT End of Session - 08/13/18 1418    Visit Number  18    Number of Visits  32    Date for PT Re-Evaluation  11/03/18    Authorization Type  [employer] Medicare; will need 10th visit progress notes    PT Start Time  0936    PT Stop Time  1019    PT Time Calculation (min)  43 min    Equipment Utilized During Treatment  Gait belt   wearing his Thusane PLS AFO   Activity Tolerance  Patient tolerated treatment well    Behavior During Therapy  WFL for tasks assessed/performed       Past Medical History:  Diagnosis Date  . BPH (benign prostatic hyperplasia)    (-) Bx 2015  . Diabetes mellitus without complication ([place])   . Elevated PSA    Prostate Bx in 06/2013 was benign  . HTN (hypertension)   . Hyperlipidemia   . Macular degeneration, age related     Past Surgical History:  Procedure Laterality Date  . ABCESS DRAINAGE     abdomen- 26 day hospitalization 1968  . COLONOSCOPY  2016  . HERNIA REPAIR  summer '11   umbilical, dr Ninfa Linden   . POLYPECTOMY    . PROSTATE BIOPSY  06-2013 , 07-2017   (-), (-)    There were no vitals filed for this visit.  Subjective Assessment - 08/13/18 0940    Subjective  No pain, no changes.    Patient is accompained by:  Family member   friend   Limitations  Walking;Standing    Patient Stated Goals  Pt wants to be able to walk again.  Wants to be able to use L hand and arm again; wants to drive, do yardwork.    Currently in Pain?  No/denies                       [facility] Adult PT Treatment/Exercise - 08/13/18 0001      Ambulation/Gait   Ambulation/Gait  Yes    Ambulation/Gait Assistance  4: Min assist    Ambulation/Gait  Assistance Details  Gait trial with no device today at end of session    Ambulation Distance (Feet)  115 Feet   230   Assistive device  None   Pt ambulates into session with RW   Gait Pattern  Step-through pattern;Decreased step length - left;Decreased stance time - left;Decreased dorsiflexion - left;Left flexed knee in stance;Trunk rotated posteriorly on left;Narrow base of support   Improved L foot clearance, initial L hip flexion no device   Ambulation Surface  Level;Indoor    Gait Comments  Pt appears to have improved initiation of L hip/knee flexion with stance phase, with improved overall L foot clearance.  Cues provided for slightly increased stance time through LLE and cues for upright posture, as pt is flexed through hips.      Neuro Re-ed    Neuro Re-ed Details   Sit <>stand transfers with L foot in posterior position, x 10 reps, cues for upright posture, "pushing" L heel into ground upon standing, hands on knees to stand; then progress to standing on Airex, sit<>stand even foot position x 5 reps,  hands on knees (needs min assist).  Tall kneeling on mat (with bench in front):  tall kneel>1/2 kneel x 10 reps (cues to go straight back versus increased weigthshift to R), cues for glut activation ("bring belly button/hips forward), upon return to tall kneel; then lateral weightshift in tall kneel, with emphasis on increased weightshift to L hip; shifting weight to L hip, unweighting R hip x 10 reps, lessening UE support, tall knee position no UE support at bench 5 reps x 5 seconds.      Knee/Hip Exercises: Stretches   Active Hamstring Stretch  Left;3 reps;30 seconds      Knee/Hip Exercises: Prone   Hamstring Curl  1 set;10 reps   with assistance, manual graded resistance for control   Hip Extension  AAROM;Strengthening;Left;1 set;10 reps   knee bent      With hamstring stretch today, L foot propped on 4" block         PT Short Term Goals - 08/05/18 1812      PT SHORT TERM GOAL  #1   Title  Pt will be independent with progression of HEP for improved strength, balance, gait.  TARGET 09/03/2018    Time  4    Period  Weeks    Status  New    Target Date  09/03/18      PT SHORT TERM GOAL #2   Title  Pt will improve DGI score to at least 12/24 for decreased fall risk.    Baseline  9/24 08/05/2018    Time  4    Period  Weeks    Status  New    Target Date  09/03/18      PT SHORT TERM GOAL #3   Title  Pt will improve Berg Balance score to at least 47/56 for decreased fall risk.    Baseline  42/56 07/29/2018    Time  4    Period  Weeks    Status  New    Target Date  09/03/18      PT SHORT TERM GOAL #4   Title  Pt will improve TUG score to less than or equal to 20 seconds for decreased fall risk.    Baseline  24.47 sec cane 07/29/2018    Time  4    Period  Weeks    Status  New    Target Date  09/03/18      PT SHORT TERM GOAL #5   Title  Pt will improve gait velocity to at least 1.8 ft/sec with cane, for improved gait efficiency/decreased fall risk.    Time  4    Period  Weeks    Status  New    Target Date  09/03/18        PT Long Term Goals - 08/05/18 1814      PT LONG TERM GOAL #1   Title  Pt will verbalize plans for continued community fitness upon d/c from PT.  TARGET 10/01/2018    Time  8    Period  Weeks    Status  On-going    Target Date  10/01/18      PT LONG TERM GOAL #2   Title  Pt will improve gait velocity to at least 2.2 ft/sec for improved gait efficiency and safety.    Baseline  1.6 ft/sec cane 07/29/2018    Time  8    Period  Weeks    Status  New    Target Date  10/01/18  PT LONG TERM GOAL #3   Title  Pt will improve DGI score to at least 17/24 for decreased fall risk    Baseline  9/24 08/05/2018    Time  8    Period  Weeks    Status  New    Target Date  10/01/18      PT LONG TERM GOAL #4   Title  Pt will improve TUG score to less than or equal to 15 seconds for decreased fall risk.    Baseline  24.47 sec at best with cane  (improved from 28.84 sec)-07/29/2018    Time  8    Period  Weeks    Status  New    Target Date  10/01/18      PT LONG TERM GOAL #5   Title  Pt will ambulate at least 5000 ft using cane. modified independently, for improved independence with gait on indoor and outdoor surfaces    Baseline       Time  8    Period  Weeks    Status  New    Target Date  10/01/18      PT LONG TERM GOAL #6   Title  Pt will negotiate at least 12 steps, one handrail, step-through pattern, modified independently, for improved stair negotiation in home.    Baseline  step through pattern with min guard 08/05/2018    Time  8    Period  Weeks    Status  New    Target Date  10/01/18            Plan - 08/13/18 1418    Clinical Impression Statement  Addressed glut and hamstring strengthening through tall kneeling and prone hamstring exercises.  Pt with decreased control/ataxic type movements in LLE with hamsting curls, with PT providing graded resistance to help with LLE control.  Gait trial with no device today; while pt has improved overall L foot clearance, he has more crouched posture and has decreased stance time on LLE.  Pt will continue to benefit from skilled PT to address strength, NMR, balance and gait training for improved functional mobility and independence.    Rehab Potential  Good    Clinical Impairments Affecting Rehab Potential  good family support; independnet prior to CVA    PT Frequency  2x / week    PT Duration  8 weeks   plus eval   PT Treatment/Interventions  ADLs/Self Care Home Management;Electrical Stimulation;Therapeutic exercise;Therapeutic activities;Functional mobility training;Gait training;Stair training;DME Instruction;Balance training;Neuromuscular re-education;Patient/family education;Orthotic Fit/Training;Manual techniques    PT Next Visit Plan  Continue tall kneeling, 1/2 kneeling (may try this on floor versus mat table); try prone again for hamstrings/gluts; gait training, LLE  strengthening, NMR and balance    Consulted and Agree with Plan of Care  Patient;Family member/caregiver    Family Member Consulted  wife       Patient will benefit from skilled therapeutic intervention in order to improve the following deficits and impairments:  Abnormal gait, Decreased balance, Decreased mobility, Difficulty walking, Decreased strength, Impaired tone, Postural dysfunction  Visit Diagnosis: Muscle weakness (generalized)  Unsteadiness on feet  Other abnormalities of gait and mobility     Problem List Patient Active Problem List   Diagnosis Date Noted  . Spastic hemiplegia affecting nondominant side ([place]) 07/07/2018  . Abnormality of gait 06/09/2018  . Neuropathic pain   . Labile blood pressure   . PAF (paroxysmal atrial fibrillation) ([place])   . Hemiparesis affecting left side as  late effect of stroke ([place])   . Thalamic hemorrhage ([place]) 03/17/2018  . Benign essential HTN   . Dyslipidemia   . Hemorrhagic stroke ([place])   . ICH (intracerebral hemorrhage) ([place]) 03/12/2018  . Prostate cancer ([place]) 01/29/2017  . 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 Diabetes ([place]) 12/14/2015  . PCP NOTES >>>>>>>>>>>>>>>>>>>>>>>>>>>>>>. 08/06/2015  . Dizziness and giddiness 01/29/2015  . Umbilical hernia 72/25/7505  . Elevated PSA, less than 10 ng/ml 04/19/2012  . Annual physical exam 01/24/2011  . Hyperlipidemia 01/03/2010  . Essential hypertension 09/20/2007    Frazier Butt. 08/13/2018, 2:22 PM  Frazier Butt., PT   [facility] [address] [place], [place], [address] Phone: [phone]   Fax:  [phone]  Name: Marcus Beasley MRN: [id number] Date of Birth: 1946-02-01

## 2018-08-15 DIAGNOSIS — I69354 Hemiplegia and hemiparesis following cerebral infarction affecting left non-dominant side: Secondary | ICD-10-CM | POA: Diagnosis not present

## 2018-08-17 ENCOUNTER — Ambulatory Visit: Payer: Medicare Other | Admitting: Physical Therapy

## 2018-08-17 DIAGNOSIS — H353131 Nonexudative age-related macular degeneration, bilateral, early dry stage: Secondary | ICD-10-CM | POA: Diagnosis not present

## 2018-08-19 ENCOUNTER — Ambulatory Visit: Payer: Medicare Other | Admitting: Physical Therapy

## 2018-08-19 DIAGNOSIS — R278 Other lack of coordination: Secondary | ICD-10-CM | POA: Diagnosis not present

## 2018-08-19 DIAGNOSIS — R2689 Other abnormalities of gait and mobility: Secondary | ICD-10-CM | POA: Diagnosis not present

## 2018-08-19 DIAGNOSIS — M6281 Muscle weakness (generalized): Secondary | ICD-10-CM | POA: Diagnosis not present

## 2018-08-19 DIAGNOSIS — R2681 Unsteadiness on feet: Secondary | ICD-10-CM | POA: Diagnosis not present

## 2018-08-19 DIAGNOSIS — I69254 Hemiplegia and hemiparesis following other nontraumatic intracranial hemorrhage affecting left non-dominant side: Secondary | ICD-10-CM | POA: Diagnosis not present

## 2018-08-19 NOTE — Therapy (Signed)
[facility] [address] [place], [place], [address] Phone: [phone]   Fax:  [phone]  Physical Therapy Treatment  Patient Details  Name: Marcus Beasley MRN: [id number] Date of Birth: Nov 12, 1945 No data recorded  Encounter Date: 08/19/2018  PT End of Session - 08/19/18 1200    Visit Number  19    Number of Visits  32    Date for PT Re-Evaluation  11/03/18    Authorization Type  [employer] Medicare; will need 10th visit progress notes    PT Start Time  0843    PT Stop Time  0931    PT Time Calculation (min)  48 min    Equipment Utilized During Treatment  Gait belt   wearing his Thusane PLS AFO   Activity Tolerance  Patient tolerated treatment well    Behavior During Therapy  WFL for tasks assessed/performed       Past Medical History:  Diagnosis Date  . BPH (benign prostatic hyperplasia)    (-) Bx 2015  . Diabetes mellitus without complication ([place])   . Elevated PSA    Prostate Bx in 06/2013 was benign  . HTN (hypertension)   . Hyperlipidemia   . Macular degeneration, age related     Past Surgical History:  Procedure Laterality Date  . ABCESS DRAINAGE     abdomen- 26 day hospitalization 1968  . COLONOSCOPY  2016  . HERNIA REPAIR  summer '11   umbilical, dr Ninfa Linden   . POLYPECTOMY    . PROSTATE BIOPSY  06-2013 , 07-2017   (-), (-)    There were no vitals filed for this visit.  Subjective Assessment - 08/19/18 1200    Subjective  Is the brace supposed to be twisted?  (PT assessed, and brace appears as it should be).  Advised if pt is having discomfort, he should contact Gerald Stabs at [employer]    Patient is accompained by:  Family member   friend   Limitations  Walking;Standing    Patient Stated Goals  Pt wants to be able to walk again.  Wants to be able to use L hand and arm again; wants to drive, do yardwork.    Currently in Pain?  No/denies                       [facility] Adult PT  Treatment/Exercise - 08/19/18 0001      Transfers   Transfers  Sit to Stand;Stand to Sit    Sit to Stand  5: Supervision;With upper extremity assist;From chair/3-in-1;From bed;Without upper extremity assist    Stand to Sit  5: Supervision;With upper extremity assist;Without upper extremity assist;To chair/3-in-1    Comments  Standing to tall kneeling with min assist, then tall kneel>1/2 kneel to stand with mod assistance, cues throughout.      Ambulation/Gait   Ambulation/Gait  Yes    Ambulation/Gait Assistance  4: Min assist    Ambulation Distance (Feet)  150 Feet   no device, 230 ft cane quad tip, then 180 ft SPC   Assistive device  None;Straight cane   SPC and SPC with quad tip   Gait Pattern  Step-through pattern;Decreased step length - left;Decreased stance time - left;Decreased dorsiflexion - left;Left flexed knee in stance;Trunk rotated posteriorly on left;Narrow base of support    Ambulation Surface  Level;Indoor    Gait Comments  With no device, pt has more crouched gait pattern, which is lessened with use of cane.  However, with cane, pt tends to increased weightbearing/weightshift through RUE leaning heavily on cane; he is able to lessen push through  RUE/cane with cues.      Neuro Re-ed    Neuro Re-ed Details   Sit <>stand transfers with L foot in posterior position, x 10 reps, cues for upright posture, "pushing" L heel into ground upon standing from 20" mat, hands on knees to stand.  On ground, tall kneeling on mat with UEs supported at mat table, tall kneel>1/2 kneel x 10 reps (cues to go straight back versus increased weigthshift to R), cues for glut activation ("bring belly button/hips forward), upon return to tall kneel; then lateral weightshift in tall kneel, with emphasis on increased weightshift to L hip; shifting weight to L hip, unweighting R hip x 10 reps, lessening UE support, tall knee position no UE support at mat 5 reps.  Tall knee>1/2 kneeling 3 reps each side, min  assist; cues to be tall through L hip when bringing R leg through to 1/2 kneel position.      Knee/Hip Exercises: Aerobic   Stepper  End of session:  set pt up on SciFit, Seated stepper, Level 2.5, lower extremities only (x 10 minutes), initial cues for leg positioning (no charge)               PT Short Term Goals - 08/05/18 1812      PT SHORT TERM GOAL #1   Title  Pt will be independent with progression of HEP for improved strength, balance, gait.  TARGET 09/03/2018    Time  4    Period  Weeks    Status  New    Target Date  09/03/18      PT SHORT TERM GOAL #2   Title  Pt will improve DGI score to at least 12/24 for decreased fall risk.    Baseline  9/24 08/05/2018    Time  4    Period  Weeks    Status  New    Target Date  09/03/18      PT SHORT TERM GOAL #3   Title  Pt will improve Berg Balance score to at least 47/56 for decreased fall risk.    Baseline  42/56 07/29/2018    Time  4    Period  Weeks    Status  New    Target Date  09/03/18      PT SHORT TERM GOAL #4   Title  Pt will improve TUG score to less than or equal to 20 seconds for decreased fall risk.    Baseline  24.47 sec cane 07/29/2018    Time  4    Period  Weeks    Status  New    Target Date  09/03/18      PT SHORT TERM GOAL #5   Title  Pt will improve gait velocity to at least 1.8 ft/sec with cane, for improved gait efficiency/decreased fall risk.    Time  4    Period  Weeks    Status  New    Target Date  09/03/18        PT Long Term Goals - 08/05/18 1814      PT LONG TERM GOAL #1   Title  Pt will verbalize plans for continued community fitness upon d/c from PT.  TARGET 10/01/2018    Time  8    Period  Weeks    Status  On-going    Target Date  10/01/18      PT LONG TERM GOAL #2   Title  Pt will improve gait velocity to at least 2.2 ft/sec for improved gait efficiency and safety.    Baseline  1.6 ft/sec cane 07/29/2018    Time  8    Period  Weeks    Status  New    Target Date  10/01/18       PT LONG TERM GOAL #3   Title  Pt will improve DGI score to at least 17/24 for decreased fall risk    Baseline  9/24 08/05/2018    Time  8    Period  Weeks    Status  New    Target Date  10/01/18      PT LONG TERM GOAL #4   Title  Pt will improve TUG score to less than or equal to 15 seconds for decreased fall risk.    Baseline  24.47 sec at best with cane (improved from 28.84 sec)-07/29/2018    Time  8    Period  Weeks    Status  New    Target Date  10/01/18      PT LONG TERM GOAL #5   Title  Pt will ambulate at least 5000 ft using cane. modified independently, for improved independence with gait on indoor and outdoor surfaces    Baseline       Time  8    Period  Weeks    Status  New    Target Date  10/01/18      PT LONG TERM GOAL #6   Title  Pt will negotiate at least 12 steps, one handrail, step-through pattern, modified independently, for improved stair negotiation in home.    Baseline  step through pattern with min guard 08/05/2018    Time  8    Period  Weeks    Status  New    Target Date  10/01/18            Plan - 08/19/18 1201    Clinical Impression Statement  Continued NMR to LLE with tall kneeling, half kneeling work, able to perform on floor this visit, with min-mod assist for floor transfer.  Used various assistive devices today (no device, SPC, cane with quad tip), and pt tends to have improved posture and improved L foot clearance iwth use of either SPC or cane/quad tip.  Overall, pt is improving with his fluidity of movement during gait, but continues to demonstrate decreased strength and reliance on LLE.  Pt will continue to benefit from skilled PT to address strength, NMR, balance and gait training for improved mobility and independence.    Rehab Potential  Good    Clinical Impairments Affecting Rehab Potential  good family support; independnet prior to CVA    PT Frequency  2x / week    PT Duration  8 weeks   plus eval   PT Treatment/Interventions   ADLs/Self Care Home Management;Electrical Stimulation;Therapeutic exercise;Therapeutic activities;Functional mobility training;Gait training;Stair training;DME Instruction;Balance training;Neuromuscular re-education;Patient/family education;Orthotic Fit/Training;Manual techniques    PT Next Visit Plan  Continue tall kneeling, 1/2 kneeling (on floor versus mat table); try prone again for hamstrings/gluts; gait training, LLE strengthening, NMR and balance    Consulted and Agree with Plan of Care  Patient;Family member/caregiver    Family Member Consulted  wife     PLAN:  NEXT VISIT NEEDS TO BE 10th visit progress note.  Patient will benefit from skilled therapeutic intervention in order to improve  the following deficits and impairments:  Abnormal gait, Decreased balance, Decreased mobility, Difficulty walking, Decreased strength, Impaired tone, Postural dysfunction  Visit Diagnosis: Muscle weakness (generalized)  Other abnormalities of gait and mobility  Unsteadiness on feet     Problem List Patient Active Problem List   Diagnosis Date Noted  . Spastic hemiplegia affecting nondominant side ([place]) 07/07/2018  . Abnormality of gait 06/09/2018  . Neuropathic pain   . Labile blood pressure   . PAF (paroxysmal atrial fibrillation) ([place])   . Hemiparesis affecting left side as late effect of stroke ([place])   . Thalamic hemorrhage ([place]) 03/17/2018  . Benign essential HTN   . Dyslipidemia   . Hemorrhagic stroke ([place])   . ICH (intracerebral hemorrhage) ([place]) 03/12/2018  . Prostate cancer ([place]) 01/29/2017  . 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 Diabetes ([place]) 12/14/2015  . PCP NOTES >>>>>>>>>>>>>>>>>>>>>>>>>>>>>>. 08/06/2015  . Dizziness and giddiness 01/29/2015  . Umbilical hernia 03/29/1218  . Elevated PSA, less than 10 ng/ml 04/19/2012  . Annual physical exam 01/24/2011  . Hyperlipidemia 01/03/2010  . Essential hypertension 09/20/2007    Everet Flagg W. 08/19/2018,  12:06 PM  Mady Haagensen, PT 08/19/18 12:07 PM Phone: [phone] Fax: [facility] [address] [place], [place], [address] Phone: [phone]   Fax:  [phone]  Name: NICKY KRAS MRN: [id number] Date of Birth: July 09, 1945

## 2018-08-23 ENCOUNTER — Ambulatory Visit: Payer: Medicare Other | Attending: Internal Medicine | Admitting: Occupational Therapy

## 2018-08-23 DIAGNOSIS — R2681 Unsteadiness on feet: Secondary | ICD-10-CM | POA: Diagnosis not present

## 2018-08-23 DIAGNOSIS — I69118 Other symptoms and signs involving cognitive functions following nontraumatic intracerebral hemorrhage: Secondary | ICD-10-CM | POA: Diagnosis not present

## 2018-08-23 DIAGNOSIS — I69254 Hemiplegia and hemiparesis following other nontraumatic intracranial hemorrhage affecting left non-dominant side: Secondary | ICD-10-CM

## 2018-08-23 DIAGNOSIS — R2689 Other abnormalities of gait and mobility: Secondary | ICD-10-CM | POA: Insufficient documentation

## 2018-08-23 DIAGNOSIS — M6281 Muscle weakness (generalized): Secondary | ICD-10-CM

## 2018-08-23 DIAGNOSIS — R208 Other disturbances of skin sensation: Secondary | ICD-10-CM | POA: Insufficient documentation

## 2018-08-23 DIAGNOSIS — R278 Other lack of coordination: Secondary | ICD-10-CM

## 2018-08-23 NOTE — Patient Instructions (Signed)
  Use Lt hand to:   Flip cards over one at a time  Hold entire deck in Lt hand and push off top card with thumb Stack coins Roll highlighter down into palm and then back to fingertips Pick up checkers 1 at a time and place in palm until you have 3, then one at a time move back to fingertips of index and thumb finger to stack Putty exercises  Use Lt arm to:  Assist in folding towels/clothes Put away LIGHT groceries and plastic dishes on lower shelves Use Lt hand to assist when bathing/dressing

## 2018-08-23 NOTE — Therapy (Signed)
[facility] [address] [place], [place], [address] Phone: [phone]   Fax:  [phone]  Occupational Therapy Treatment  Patient Details  Name: Marcus Beasley MRN: [id number] Date of Birth: Dec 03, 1945 Referring Provider (OT): Venancio Poisson   Encounter Date: 08/23/2018  OT End of Session - 08/23/18 1306    Visit Number  14    Number of Visits  30    Date for OT Re-Evaluation  10/23/18    Authorization Type  UHC MCR    Authorization - Visit Number  14    Authorization - Number of Visits  20    OT Start Time  0930    OT Stop Time  1015    OT Time Calculation (min)  45 min    Activity Tolerance  Patient tolerated treatment well    Behavior During Therapy  [facility] for tasks assessed/performed       Past Medical History:  Diagnosis Date  . BPH (benign prostatic hyperplasia)    (-) Bx 2015  . Diabetes mellitus without complication ([place])   . Elevated PSA    Prostate Bx in 06/2013 was benign  . HTN (hypertension)   . Hyperlipidemia   . Macular degeneration, age related     Past Surgical History:  Procedure Laterality Date  . ABCESS DRAINAGE     abdomen- 26 day hospitalization 1968  . COLONOSCOPY  2016  . HERNIA REPAIR  summer '11   umbilical, dr Ninfa Linden   . POLYPECTOMY    . PROSTATE BIOPSY  06-2013 , 07-2017   (-), (-)    There were no vitals filed for this visit.  Subjective Assessment - 08/23/18 0938    Pertinent History  Rt thalamic hemorrhage 03/12/18. PMH: HTN, HLD, DM, A-fib    Limitations  Fall risk    Patient Stated Goals  get more function in my Lt arm and hand    Currently in Pain?  Yes    Pain Score  3     Pain Location  Arm    Pain Orientation  Left    Pain Descriptors / Indicators  Aching;Heaviness    Pain Type  Acute pain    Pain Onset  More than a month ago    Pain Frequency  Intermittent    Aggravating Factors   squeezing hand, weight bearing    Pain Relieving Factors  rest          [facility] OT Assessment - 08/23/18 0001      Coordination   Left 9 Hole Peg Test  placed 4 pegs in 2 minutes      Hand Function   Left Hand Grip (lbs)  25 lbs        Assessed progress towards goals and made adjustments in POC as needed for renewal period.  Issued and reviewed updated coordination HEP for Lt hand. Pt practiced. Also reviewed functional things he can do with LUE and review of things he should NOT be doing with LUE               OT Education - 08/23/18 1010    Education Details  Updated coordination ex's and functional use of LUE    Person(s) Educated  Patient    Methods  Explanation;Demonstration;Handout    Comprehension  Verbalized understanding;Returned demonstration       OT Short Term Goals - 08/23/18 1307      OT SHORT TERM GOAL #1   Title  Pt independent  with LUE HEP for neuro re-education - 07/03/18    Time  4    Period  Weeks    Status  Achieved      OT SHORT TERM GOAL #2   Title  Pt independent with coordination and putty HEP Lt hand    Time  4    Period  Weeks    Status  Achieved      OT SHORT TERM GOAL #3   Title  Pt to verbalize understanding with safety considerations LUE d/t lack of sensation    Time  4    Period  Weeks    Status  Achieved      OT SHORT TERM GOAL #4   Title  Pt to cut food mod I level with A/E prn    Time  4    Period  Weeks    Status  Achieved   IN CLINIC     OT SHORT TERM GOAL #5   Title  Pt to consistently don/doff shirt I'ly and don/doff socks I'ly w/ task modifications prn    Time  4    Period  Weeks    Status  Achieved   IN CLINIC     Additional Short Term Goals   Additional Short Term Goals  Yes      OT SHORT TERM GOAL #6   Title  Pt to verbalize understanding and consistently demo correct reaching pattern LUE to prevent pain Lt shoulder    Time  4    Period  Weeks    Status  On-going      OT SHORT TERM GOAL #7   Title  Pt to improve coordination Lt hand as evidenced by performing 9  hole peg test in under 2 min. 30 sec.     Baseline  eval - placed 5 pegs in 2 min.,  08/23/18 - placed only 4 pegs in 2 min.    Time  4    Period  Weeks    Status  Revised      OT SHORT TERM GOAL #8   Title  Pt to demo 30 lbs grip strength Lt hand to assist w/ opening containers    Baseline  eval 20 lbs, 08/23/18 = 25 lbs    Time  4    Period  Weeks    Status  New        OT Long Term Goals - 08/23/18 1310      OT LONG TERM GOAL #1   Title  Pt to perform functional mid to high level reaching LUE w/ only min compensations and no pain - 08/03/18    Time  8    Period  Weeks    Status  Partially Met   mid level only     OT LONG TERM GOAL #2   Title  Pt to improve Lt hand coordination as evidenced by performing 9 hole peg test in 2 min. or under    Time  8    Period  Weeks    Status  Revised      OT LONG TERM GOAL #3   Title  Pt to improve grip strength Lt hand to 40 lbs or greater to assist w/ opening jars/containers    Baseline  eval: 20 lbs,  08/23/18 = 25 lbs    Time  8    Period  Weeks    Status  On-going      OT LONG TERM GOAL #4     Title  Pt to demo full composite flexion Lt hand for coordination/grasping tasks    Baseline  eval: 75%,  08/23/18: 85%    Time  8    Period  Weeks    Status  On-going      OT LONG TERM GOAL #5   Title  Pt to be able to tie shoe laces and hook/unhook buttons using Lt hand as assist    Time  8    Period  Weeks    Status  New      OT LONG TERM GOAL #6   Title  Pt to return to simple sandwich prep and cleaning tasks from standing level w/o LOB using countertop support prn    Time  8    Period  Weeks    Status  Revised      OT LONG TERM GOAL #7   Title  Pt to return to financial management tasks w/ supervision prn    Time  8    Period  Weeks    Status  Deferred            Plan - 08/23/18 1312    Clinical Impression Statement  Revised and updated goals prn for renewal period starting today - see goal section. Recommending 2x/wk for  8 more weeks    OT Occupational Profile and History  Problem Focused Assessment - Including review of records relating to presenting problem    Occupational Profile and client history currently impacting functional performance  PMH; HTN, HLD, DM, A-fib. Current deficits impeding pt's ability to perform ADLS, IADLS, and role as husband, friend, and leisure participation    Occupational performance deficits (Please refer to evaluation for details):  ADL's;IADL's;Leisure    Pt will benefit from skilled therapeutic intervention in order to improve on the following performance deficits  Body Structure / Function / Physical Skills;Cognitive Skills    Body Structure / Function / Physical Skills  ADL;Decreased knowledge of precautions;ROM;UE functional use;FMC;Decreased knowledge of use of DME;Balance;Sensation;Endurance;Pain;Strength;Coordination;IADL;Proprioception;Tone    Cognitive Skills  Attention;Safety Awareness;Memory    Rehab Potential  Good    Clinical Decision Making  Several treatment options, min-mod task modification necessary    Comorbidities Affecting Occupational Performance:  May have comorbidities impacting occupational performance    OT Frequency  2x / week    OT Duration  8 MORE weeks    OT Treatment/Interventions  Self-care/ADL training;Moist Heat;DME and/or AE instruction;Splinting;Therapeutic activities;Psychosocial skills training;Aquatic Therapy;Therapeutic exercise;Cognitive remediation/compensation;Coping strategies training;Neuromuscular education;Functional Mobility Training;Passive range of motion;Visual/perceptual remediation/compensation;Manual Therapy;Patient/family education;Electrical Stimulation    Plan  progress towards revised/new goals for renewal period    Consulted and Agree with Plan of Care  Patient       Patient will benefit from skilled therapeutic intervention in order to improve the following deficits and impairments:     Visit Diagnosis: 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 Spastic hemiplegia affecting nondominant side (HCC) 07/07/2018  . Abnormality of gait 06/09/2018  . Neuropathic pain   . Labile blood pressure   . PAF (paroxysmal atrial fibrillation) (HCC)   . Hemiparesis affecting left side as late effect of stroke (HCC)   . 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 Benign essential HTN   . Dyslipidemia   . Hemorrhagic stroke (HCC)   . ICH (intracerebral hemorrhage) (HCC)   03/12/2018  . Prostate cancer (HCC) 01/29/2017  . 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 Diabetes (HCC) 12/14/2015  . PCP NOTES >>>>>>>>>>>>>>>>>>>>>>>>>>>>>>. 08/06/2015  . Dizziness and giddiness 01/29/2015  . Umbilical hernia 01/23/2014  . Elevated PSA, less than 10 ng/ml 04/19/2012  . Annual physical exam 01/24/2011  . Hyperlipidemia 01/03/2010  . Essential hypertension 09/20/2007    Ballie, Kelly Johnson, OTR/L 08/23/2018, 1:15 PM  [facility] [address] [place], [place], [address] Phone: [phone]   Fax:  [phone]  Name: Marcus Beasley MRN: [id number] Date of Birth: 02/12/1946 

## 2018-08-24 ENCOUNTER — Ambulatory Visit: Payer: Medicare Other | Admitting: Physical Therapy

## 2018-08-24 ENCOUNTER — Ambulatory Visit: Payer: Medicare Other | Admitting: Occupational Therapy

## 2018-08-24 ENCOUNTER — Encounter: Payer: Self-pay | Admitting: Physical Therapy

## 2018-08-24 DIAGNOSIS — R208 Other disturbances of skin sensation: Secondary | ICD-10-CM | POA: Diagnosis not present

## 2018-08-24 DIAGNOSIS — R2681 Unsteadiness on feet: Secondary | ICD-10-CM | POA: Diagnosis not present

## 2018-08-24 DIAGNOSIS — M6281 Muscle weakness (generalized): Secondary | ICD-10-CM | POA: Diagnosis not present

## 2018-08-24 DIAGNOSIS — R278 Other lack of coordination: Secondary | ICD-10-CM | POA: Diagnosis not present

## 2018-08-24 DIAGNOSIS — R2689 Other abnormalities of gait and mobility: Secondary | ICD-10-CM | POA: Diagnosis not present

## 2018-08-24 DIAGNOSIS — I69254 Hemiplegia and hemiparesis following other nontraumatic intracranial hemorrhage affecting left non-dominant side: Secondary | ICD-10-CM | POA: Diagnosis not present

## 2018-08-24 DIAGNOSIS — I69118 Other symptoms and signs involving cognitive functions following nontraumatic intracerebral hemorrhage: Secondary | ICD-10-CM | POA: Diagnosis not present

## 2018-08-24 NOTE — Therapy (Signed)
[facility] [address] [place], [place], [address] Phone: [phone]   Fax:  [phone]  Physical Therapy Treatment  Patient Details  Name: Marcus Beasley MRN: [id number] Date of Birth: [birth year] No data recorded  Encounter Date: 08/24/2018  PT End of Session - 08/24/18 1410    Visit Number  20    Number of Visits  32    Date for PT Re-Evaluation  11/03/18    Authorization Type  UHC Medicare; will need 10th visit progress notes    PT Start Time  0934    PT Stop Time  1014    PT Time Calculation (min)  40 min    Equipment Utilized During Treatment  Gait belt   wearing his Thusane PLS AFO   Activity Tolerance  Patient tolerated treatment well    Behavior During Therapy  WFL for tasks assessed/performed       Past Medical History:  Diagnosis Date  . BPH (benign prostatic hyperplasia)    (-) Bx 2015  . Diabetes mellitus without complication ([place])   . Elevated PSA    Prostate Bx in 06/2013 was benign  . HTN (hypertension)   . Hyperlipidemia   . Macular degeneration, age related     Past Surgical History:  Procedure Laterality Date  . ABCESS DRAINAGE     abdomen- 26 day hospitalization 1968  . COLONOSCOPY  2016  . HERNIA REPAIR  summer '11   umbilical, dr Ninfa Linden   . POLYPECTOMY    . PROSTATE BIOPSY  06-2013 , 07-2017   (-), (-)    There were no vitals filed for this visit.  Subjective Assessment - 08/24/18 0938    Subjective  Regular old-[place] today.  Just a little pain in L hand.    Patient is accompained by:  Family member   friend   Limitations  Walking;Standing    Patient Stated Goals  Pt wants to be able to walk again.  Wants to be able to use L hand and arm again; wants to drive, do yardwork.    Currently in Pain?  Yes    Pain Score  3     Pain Location  Arm    Pain Orientation  Left    Pain Descriptors / Indicators  Aching    Pain Type  Acute pain    Pain Frequency  Intermittent     Aggravating Factors   weightbearing    Pain Relieving Factors  rest                       [facility] Adult PT Treatment/Exercise - 08/24/18 0001      Transfers   Transfers  Sit to Stand;Stand to Sit    Sit to Stand  5: Supervision;With upper extremity assist;From chair/3-in-1;From bed;Without upper extremity assist    Stand to Sit  5: Supervision;With upper extremity assist;Without upper extremity assist;To chair/3-in-1      Ambulation/Gait   Ambulation/Gait  Yes    Ambulation/Gait Assistance  4: Min guard    Ambulation/Gait Assistance Details  When leaving PT session with RW, provided cues for pt to lessen UE support and to work on consistent step through pattern with less hesitation/step and stop pattern    Ambulation Distance (Feet)  315 Feet   then 230, 100 ft x 3   Assistive device  Straight cane    Gait Pattern  Step-through pattern;Decreased step length - left;Decreased stance time - left;Decreased  dorsiflexion - left;Left flexed knee in stance;Trunk rotated posteriorly on left;Narrow base of support    Ambulation Surface  Level;Indoor    Gait velocity  23.66 sec with RW = 1.39 ft/sec; 21.04 sec with cane (1.56 ft/sec)   20.72 sec = 1.58 ft/sec   Stairs  Yes    Stair Management Technique  One rail Right;Alternating pattern;Forwards    Number of Stairs  4   4 reps   Height of Stairs  6    Gait Comments  Gait with environmental scanning with min assist and cues for upright posture      Timed Up and Go Test   TUG  Normal TUG    Normal TUG (seconds)  18.88   cane     Knee/Hip Exercises: Standing   Forward Step Up  Left;1 set;10 reps;Hand Hold: 1;Step Height: 6"   2nd set, 5 reps   SLS with Vectors  Alternating step taps to 6" step, 12" step with bilateral support x 10 reps, then to 6" rep, 1 UE support x 10 reps               PT Short Term Goals - 08/05/18 1812      PT SHORT TERM GOAL #1   Title  Pt will be independent with progression of HEP for  improved strength, balance, gait.  TARGET 09/03/2018    Time  4    Period  Weeks    Status  New    Target Date  09/03/18      PT SHORT TERM GOAL #2   Title  Pt will improve DGI score to at least 12/24 for decreased fall risk.    Baseline  9/24 08/05/2018    Time  4    Period  Weeks    Status  New    Target Date  09/03/18      PT SHORT TERM GOAL #3   Title  Pt will improve Berg Balance score to at least 47/56 for decreased fall risk.    Baseline  42/56 07/29/2018    Time  4    Period  Weeks    Status  New    Target Date  09/03/18      PT SHORT TERM GOAL #4   Title  Pt will improve TUG score to less than or equal to 20 seconds for decreased fall risk.    Baseline  24.47 sec cane 07/29/2018    Time  4    Period  Weeks    Status  New    Target Date  09/03/18      PT SHORT TERM GOAL #5   Title  Pt will improve gait velocity to at least 1.8 ft/sec with cane, for improved gait efficiency/decreased fall risk.    Time  4    Period  Weeks    Status  New    Target Date  09/03/18        PT Long Term Goals - 08/05/18 1814      PT LONG TERM GOAL #1   Title  Pt will verbalize plans for continued community fitness upon d/c from PT.  TARGET 10/01/2018    Time  8    Period  Weeks    Status  On-going    Target Date  10/01/18      PT LONG TERM GOAL #2   Title  Pt will improve gait velocity to at least 2.2 ft/sec for improved gait efficiency and safety.  Baseline  1.6 ft/sec cane 07/29/2018    Time  8    Period  Weeks    Status  New    Target Date  10/01/18      PT LONG TERM GOAL #3   Title  Pt will improve DGI score to at least 17/24 for decreased fall risk    Baseline  9/24 08/05/2018    Time  8    Period  Weeks    Status  New    Target Date  10/01/18      PT LONG TERM GOAL #4   Title  Pt will improve TUG score to less than or equal to 15 seconds for decreased fall risk.    Baseline  24.47 sec at best with cane (improved from 28.84 sec)-07/29/2018    Time  8    Period  Weeks     Status  New    Target Date  10/01/18      PT LONG TERM GOAL #5   Title  Pt will ambulate at least 5000 ft using cane. modified independently, for improved independence with gait on indoor and outdoor surfaces    Baseline       Time  8    Period  Weeks    Status  New    Target Date  10/01/18      PT LONG TERM GOAL #6   Title  Pt will negotiate at least 12 steps, one handrail, step-through pattern, modified independently, for improved stair negotiation in home.    Baseline  step through pattern with min guard 08/05/2018    Time  8    Period  Weeks    Status  New    Target Date  10/01/18            Plan - 08/24/18 1412    Clinical Impression Statement  20th visit progress note (covering visits 11-20, from dates 07/09/2018-08/24/2018):  Objective measures today, using SPC, gait velocity 1.58 ft/sec and TUG score 18.88 sec (compared to 24.47 sec with rubber tip quad cane at 07/29/18 goal check).  Pt continues to use RW at home and has received his thusane PLS AFO; in therapy, he has progressed from using RW to cane with quad tip to using SPC, with min guard assist today.  Pt is progressing towards goals and continues to demonstrate improved strength; however, with fatigue, he demonstrates occasional decreased control/instability at L knee. While he is improving TUG score and maintaining gait velocity with lesser assistive device, he remains at fall risk and would continue to benefit from skilled PT to further address balance, strength,a nd gait training for improved functional mobility and progression towards gait independence.    Rehab Potential  Good    Clinical Impairments Affecting Rehab Potential  good family support; independnet prior to CVA    PT Frequency  2x / week    PT Duration  8 weeks   plus eval   PT Treatment/Interventions  ADLs/Self Care Home Management;Electrical Stimulation;Therapeutic exercise;Therapeutic activities;Functional mobility training;Gait training;Stair  training;DME Instruction;Balance training;Neuromuscular re-education;Patient/family education;Orthotic Fit/Training;Manual techniques    PT Next Visit Plan  Continue tall kneeling, 1/2 kneeling (on floor versus mat table); try prone again for hamstrings/gluts; gait training with cane (begin discussion of cane for home) LLE strengthening, NMR and balance; try treadmill?    Consulted and Agree with Plan of Care  Patient;Family member/caregiver    Family Member Consulted  Lurena Joiner, friend       Patient will benefit  from skilled therapeutic intervention in order to improve the following deficits and impairments:  Abnormal gait, Decreased balance, Decreased mobility, Difficulty walking, Decreased strength, Impaired tone, Postural dysfunction  Visit Diagnosis: Other abnormalities of gait and mobility  Unsteadiness on feet  Muscle weakness (generalized)     Problem List Patient Active Problem List   Diagnosis Date Noted  . Spastic hemiplegia affecting nondominant side ([place]) 07/07/2018  . Abnormality of gait 06/09/2018  . Neuropathic pain   . Labile blood pressure   . PAF (paroxysmal atrial fibrillation) ([place])   . Hemiparesis affecting left side as late effect of stroke ([place])   . Thalamic hemorrhage ([place]) 03/17/2018  . Benign essential HTN   . Dyslipidemia   . Hemorrhagic stroke ([place])   . ICH (intracerebral hemorrhage) ([place]) 03/12/2018  . Prostate cancer ([place]) 01/29/2017  . Cancer of trigone of urinary bladder ([place]) 01/29/2017  . Diabetes () 12/14/2015  . PCP NOTES >>>>>>>>>>>>>>>>>>>>>>>>>>>>>>. 08/06/2015  . Dizziness and giddiness 01/29/2015  . Umbilical hernia 93/81/8299  . Elevated PSA, less than 10 ng/ml 04/19/2012  . Annual physical exam 01/24/2011  . Hyperlipidemia 01/03/2010  . Essential hypertension 09/20/2007    Frazier Butt. 08/24/2018, 2:22 PM  Frazier Butt., PT   [facility] [address] [place], [place], [address] Phone: [phone]   Fax:  [phone]  Name: CHRISTIE COPLEY MRN: [id number] Date of Birth: 08-08-1945

## 2018-08-26 ENCOUNTER — Ambulatory Visit: Payer: Medicare Other | Admitting: Occupational Therapy

## 2018-08-26 ENCOUNTER — Ambulatory Visit: Payer: Medicare Other | Admitting: Physical Therapy

## 2018-08-27 ENCOUNTER — Encounter: Payer: Medicare Other | Attending: Physical Medicine & Rehabilitation | Admitting: Physical Medicine & Rehabilitation

## 2018-08-27 ENCOUNTER — Encounter: Payer: Self-pay | Admitting: Physical Medicine & Rehabilitation

## 2018-08-27 VITALS — BP 154/99 | HR 66 | Ht 71.0 in | Wt 172.0 lb

## 2018-08-27 DIAGNOSIS — M7552 Bursitis of left shoulder: Secondary | ICD-10-CM

## 2018-08-27 DIAGNOSIS — I10 Essential (primary) hypertension: Secondary | ICD-10-CM | POA: Insufficient documentation

## 2018-08-27 DIAGNOSIS — E119 Type 2 diabetes mellitus without complications: Secondary | ICD-10-CM | POA: Insufficient documentation

## 2018-08-27 NOTE — Progress Notes (Signed)
Ultrasound guided left subacromial shoulder injection  Indication:Shoulder pain not relieved by medication management and other conservative care.  Informed consent was obtained after describing risks and benefits of the procedure with the patient, this includes bleeding, bruising, infection and medication side effects. The patient wishes to proceed and has given written consent. Patient was placed in a seated position with the elbow flexed to 90 degrees and held at the abdomen.  The left shoulder was marked and prepped with betadine in the posterior subacromial area. Using ultrasound tranducer was placed superior and perpendicular to the scapular spine.  The subacromial bursa was visualized.  Vapocoolant spray was applied.  A 22-gauge 2 inch [place] needle was inserted into the subacromial area under with visualization until the needle was in the bursa. After negative draw back for blood, a solution containing 1 mL of 6 mg per ML Cel[place] and 4 mL of 1% lidocaine was injected. A band aid was applied. The patient tolerated the procedure well. Post procedure instructions were given.

## 2018-08-30 ENCOUNTER — Other Ambulatory Visit: Payer: Self-pay | Admitting: Physical Medicine & Rehabilitation

## 2018-08-31 ENCOUNTER — Encounter: Payer: Self-pay | Admitting: Physical Therapy

## 2018-08-31 ENCOUNTER — Ambulatory Visit: Payer: Medicare Other | Admitting: Occupational Therapy

## 2018-08-31 ENCOUNTER — Ambulatory Visit: Payer: Medicare Other | Admitting: Physical Therapy

## 2018-08-31 DIAGNOSIS — R278 Other lack of coordination: Secondary | ICD-10-CM | POA: Diagnosis not present

## 2018-08-31 DIAGNOSIS — I69118 Other symptoms and signs involving cognitive functions following nontraumatic intracerebral hemorrhage: Secondary | ICD-10-CM | POA: Diagnosis not present

## 2018-08-31 DIAGNOSIS — M6281 Muscle weakness (generalized): Secondary | ICD-10-CM

## 2018-08-31 DIAGNOSIS — R2689 Other abnormalities of gait and mobility: Secondary | ICD-10-CM | POA: Diagnosis not present

## 2018-08-31 DIAGNOSIS — R208 Other disturbances of skin sensation: Secondary | ICD-10-CM

## 2018-08-31 DIAGNOSIS — R2681 Unsteadiness on feet: Secondary | ICD-10-CM | POA: Diagnosis not present

## 2018-08-31 DIAGNOSIS — I69254 Hemiplegia and hemiparesis following other nontraumatic intracranial hemorrhage affecting left non-dominant side: Secondary | ICD-10-CM

## 2018-08-31 NOTE — Therapy (Signed)
[facility] [address] [place], [place], [address] Phone: [phone]   Fax:  [phone]  Physical Therapy Treatment  Patient Details  Name: Marcus Beasley MRN: [id number] Date of Birth: 17-Oct-1945 No data recorded  Encounter Date: 08/31/2018  PT End of Session - 08/31/18 1117    Visit Number  21    Number of Visits  32    Date for PT Re-Evaluation  11/03/18    Authorization Type  [employer] Medicare; will need 10th visit progress notes    PT Start Time  0933    PT Stop Time  1015    PT Time Calculation (min)  42 min    Equipment Utilized During Treatment  Gait belt   wearing his Thusane PLS AFO   Activity Tolerance  Patient tolerated treatment well    Behavior During Therapy  WFL for tasks assessed/performed       Past Medical History:  Diagnosis Date  . BPH (benign prostatic hyperplasia)    (-) Bx 2015  . Diabetes mellitus without complication ([place])   . Elevated PSA    Prostate Bx in 06/2013 was benign  . HTN (hypertension)   . Hyperlipidemia   . Macular degeneration, age related     Past Surgical History:  Procedure Laterality Date  . ABCESS DRAINAGE     abdomen- 26 day hospitalization 1968  . COLONOSCOPY  2016  . HERNIA REPAIR  summer '11   umbilical, dr Ninfa Linden   . POLYPECTOMY    . PROSTATE BIOPSY  06-2013 , 07-2017   (-), (-)    There were no vitals filed for this visit.  Subjective Assessment - 08/31/18 0939    Subjective  REady to take this brace off!  My hand can work on closing now.    Patient is accompained by:  Family member   friend   Limitations  Walking;Standing    Patient Stated Goals  Pt wants to be able to walk again.  Wants to be able to use L hand and arm again; wants to drive, do yardwork.    Currently in Pain?  Yes    Pain Score  3     Pain Location  Hand    Pain Orientation  Left    Pain Descriptors / Indicators  Aching    Pain Frequency  Intermittent    Aggravating Factors    putting weight through hand    Pain Relieving Factors  res                       [facility] Adult PT Treatment/Exercise - 08/31/18 1829      Ambulation/Gait   Ambulation/Gait  Yes    Ambulation/Gait Assistance  4: Min guard    Ambulation Distance (Feet)  345 Feet   SPC, 345 ft with rubber tip quad cane, 115 ft no device   Assistive device  Straight cane;None   rubber tip quad cane   Gait Pattern  Step-through pattern;Decreased step length - left;Decreased stance time - left;Decreased dorsiflexion - left;Left flexed knee in stance;Trunk rotated posteriorly on left;Narrow base of support    Ambulation Surface  Level;Indoor    Gait Comments  Gait with environmental scanning, cues to look for objects, with noted slowed pace with gait.  Discussed working towards progression of gait with cane at home and asked pt to obtain [facility] with rubber quad tip, NOT use yet at home but bring back in for further  instruction and sizing next visit.      Self-Care   Self-Care  Other Self-Care Comments    Other Self-Care Comments   Discussed POC and scheduling (this is week to check goals and wk 4 of 8 in POC); pt agreeable to continueing POC towards LTGs and scheduling additional appts.  Discussed probable progression to cane in home environment and that pt will need close supervision with use of cane at home. (advised to not yet walk wtih cane at home, but still use RW until instructed by PT)      Knee/Hip Exercises: Standing   Lateral Step Up  Left;1 set;Hand Hold: 1;Step Height: 6"   8 reps   Lateral Step Up Limitations  cues to lessen L posterior pelvic tilts    Forward Step Up  Left;1 set;10 reps;Hand Hold: 1;Step Height: 6"    Forward Step Up Limitations  cues for posture    SLS with Vectors  Alternating step taps to 12" step with bilateral support x 10 reps, then LLE as stance, RLE step tap to 6" step, then posterior RLE step (runner's stretch position) x 10 reps.  Cues for L knee control              PT Education - 08/31/18 1116    Education Details  POC, scheduling, working towards transition to cane use at home; information on type of cane to purchase and to bring in next visit for sizing/further instruction    Person(s) Educated  Patient;Other (comment)   friend   Methods  Explanation    Comprehension  Verbalized understanding       PT Short Term Goals - 08/05/18 1812      PT SHORT TERM GOAL #1   Title  Pt will be independent with progression of HEP for improved strength, balance, gait.  TARGET 09/03/2018    Time  4    Period  Weeks    Status  New    Target Date  09/03/18      PT SHORT TERM GOAL #2   Title  Pt will improve DGI score to at least 12/24 for decreased fall risk.    Baseline  9/24 08/05/2018    Time  4    Period  Weeks    Status  New    Target Date  09/03/18      PT SHORT TERM GOAL #3   Title  Pt will improve Berg Balance score to at least 47/56 for decreased fall risk.    Baseline  42/56 07/29/2018    Time  4    Period  Weeks    Status  New    Target Date  09/03/18      PT SHORT TERM GOAL #4   Title  Pt will improve TUG score to less than or equal to 20 seconds for decreased fall risk.    Baseline  24.47 sec cane 07/29/2018    Time  4    Period  Weeks    Status  New    Target Date  09/03/18      PT SHORT TERM GOAL #5   Title  Pt will improve gait velocity to at least 1.8 ft/sec with cane, for improved gait efficiency/decreased fall risk.    Time  4    Period  Weeks    Status  New    Target Date  09/03/18        PT Long Term Goals - 08/05/18 1814  PT LONG TERM GOAL #1   Title  Pt will verbalize plans for continued community fitness upon d/c from PT.  TARGET 10/01/2018    Time  8    Period  Weeks    Status  On-going    Target Date  10/01/18      PT LONG TERM GOAL #2   Title  Pt will improve gait velocity to at least 2.2 ft/sec for improved gait efficiency and safety.    Baseline  1.6 ft/sec cane 07/29/2018    Time  8     Period  Weeks    Status  New    Target Date  10/01/18      PT LONG TERM GOAL #3   Title  Pt will improve DGI score to at least 17/24 for decreased fall risk    Baseline  9/24 08/05/2018    Time  8    Period  Weeks    Status  New    Target Date  10/01/18      PT LONG TERM GOAL #4   Title  Pt will improve TUG score to less than or equal to 15 seconds for decreased fall risk.    Baseline  24.47 sec at best with cane (improved from 28.84 sec)-07/29/2018    Time  8    Period  Weeks    Status  New    Target Date  10/01/18      PT LONG TERM GOAL #5   Title  Pt will ambulate at least 5000 ft using cane. modified independently, for improved independence with gait on indoor and outdoor surfaces    Baseline       Time  8    Period  Weeks    Status  New    Target Date  10/01/18      PT LONG TERM GOAL #6   Title  Pt will negotiate at least 12 steps, one handrail, step-through pattern, modified independently, for improved stair negotiation in home.    Baseline  step through pattern with min guard 08/05/2018    Time  8    Period  Weeks    Status  New    Target Date  10/01/18            Plan - 08/31/18 1118    Clinical Impression Statement  Skilled PT working on gait activities with SPC, cane with rubber quad tip and no device, as well as SLS NMR exercises at steps.  As pt fatigues, he demonstrates decreased L knee stability; slowed gait pace noted with environmental scanning.  Pt will continue to benefit from skilled PT to address strength, balance, and gait training for return to more independent gait.    Rehab Potential  Good    Clinical Impairments Affecting Rehab Potential  good family support; independnet prior to CVA    PT Frequency  2x / week    PT Duration  8 weeks   plus eval   PT Treatment/Interventions  ADLs/Self Care Home Management;Electrical Stimulation;Therapeutic exercise;Therapeutic activities;Functional mobility training;Gait training;Stair training;DME  Instruction;Balance training;Neuromuscular re-education;Patient/family education;Orthotic Fit/Training;Manual techniques    PT Next Visit Plan  Check STGs (check scheduling); NMR, balance, gait (?try treadmill?); tall kneeling, half-kneeling for trunk/hip stability, prone again for hamstring and gluts; if pt brings cane, work on sizing and instruction for short distance use at home with close supervision    Consulted and Agree with Plan of Care  Patient;Family member/caregiver    Family Member Consulted  Lurena Joiner, friend       Patient will benefit from skilled therapeutic intervention in order to improve the following deficits and impairments:  Abnormal gait, Decreased balance, Decreased mobility, Difficulty walking, Decreased strength, Impaired tone, Postural dysfunction  Visit Diagnosis: Other abnormalities of gait and mobility  Unsteadiness on feet  Muscle weakness (generalized)     Problem List Patient Active Problem List   Diagnosis Date Noted  . Spastic hemiplegia affecting nondominant side ([place]) 07/07/2018  . Abnormality of gait 06/09/2018  . Neuropathic pain   . Labile blood pressure   . PAF (paroxysmal atrial fibrillation) ([place])   . Hemiparesis affecting left side as late effect of stroke ([place])   . Thalamic hemorrhage ([place]) 03/17/2018  . Benign essential HTN   . Dyslipidemia   . Hemorrhagic stroke ([place])   . ICH (intracerebral hemorrhage) ([place]) 03/12/2018  . Prostate cancer ([place]) 01/29/2017  . 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 Diabetes ([place]) 12/14/2015  . PCP NOTES >>>>>>>>>>>>>>>>>>>>>>>>>>>>>>. 08/06/2015  . Dizziness and giddiness 01/29/2015  . Umbilical hernia 07/37/1062  . Elevated PSA, less than 10 ng/ml 04/19/2012  . Annual physical exam 01/24/2011  . Hyperlipidemia 01/03/2010  . Essential hypertension 09/20/2007    Frazier Butt. 08/31/2018, 11:22 AM Frazier Butt., PT [facility] [address] [place], [place], [address] Phone: [phone]   Fax:  [phone]  Name: LATASHA PUSKAS MRN: [id number] Date of Birth: [birth year]

## 2018-08-31 NOTE — Therapy (Signed)
[facility] [address] [place], [place], [address] Phone: [phone]   Fax:  [phone]  Occupational Therapy Treatment  Patient Details  Name: Marcus Beasley MRN: [id number] Date of Birth: 1945-11-18 Referring Provider (OT): Venancio Poisson   Encounter Date: 08/31/2018  OT End of Session - 08/31/18 1433    Visit Number  15    Number of Visits  30    Date for OT Re-Evaluation  10/23/18    Authorization Type  [employer] MCR    Authorization - Visit Number  15    Authorization - Number of Visits  20    OT Start Time  6384    OT Stop Time  1100    OT Time Calculation (min)  45 min    Activity Tolerance  Patient tolerated treatment well    Behavior During Therapy  [facility] for tasks assessed/performed       Past Medical History:  Diagnosis Date  . BPH (benign prostatic hyperplasia)    (-) Bx 2015  . Diabetes mellitus without complication ([place])   . Elevated PSA    Prostate Bx in 06/2013 was benign  . HTN (hypertension)   . Hyperlipidemia   . Macular degeneration, age related     Past Surgical History:  Procedure Laterality Date  . ABCESS DRAINAGE     abdomen- 26 day hospitalization 1968  . COLONOSCOPY  2016  . HERNIA REPAIR  summer '11   umbilical, dr Ninfa Linden   . POLYPECTOMY    . PROSTATE BIOPSY  06-2013 , 07-2017   (-), (-)    There were no vitals filed for this visit.  Subjective Assessment - 08/31/18 1029    Subjective   I had an injection in my Lt shoulder last Friday    Pertinent History  Rt thalamic hemorrhage 03/12/18. PMH: HTN, HLD, DM, A-fib    Limitations  Fall risk    Patient Stated Goals  get more function in my Lt arm and hand    Currently in Pain?  Yes    Pain Score  4     Pain Location  Hand    Pain Orientation  Left    Pain Descriptors / Indicators  Tingling    Pain Onset  More than a month ago    Pain Frequency  Intermittent    Aggravating Factors   passive flexion, wt bearing through  hand    Pain Relieving Factors  rest       Wt bearing over elbow, then extended arm LUE w/ body on arm movements.  Closed chain shoulder flexion w/ elbow ext and mod to max cueing to prevent compensations and extend elbow Worked on isolated forearm rotation while preventing sh IR w/ min distal support w/ functional reaching Also worked on isolated relaxation of neck/shoulder muscles while maintaining anterior pelvic tilt                      OT Short Term Goals - 08/23/18 1307      OT SHORT TERM GOAL #1   Title  Pt independent with LUE HEP for neuro re-education - 07/03/18    Time  4    Period  Weeks    Status  Achieved      OT SHORT TERM GOAL #2   Title  Pt independent with coordination and putty HEP Lt hand    Time  4    Period  Weeks    Status  Achieved      OT SHORT TERM GOAL #3   Title  Pt to verbalize understanding with safety considerations LUE d/t lack of sensation    Time  4    Period  Weeks    Status  Achieved      OT SHORT TERM GOAL #4   Title  Pt to cut food mod I level with A/E prn    Time  4    Period  Weeks    Status  Achieved   IN CLINIC     OT SHORT TERM GOAL #5   Title  Pt to consistently don/doff shirt I'ly and don/doff socks I'ly w/ task modifications prn    Time  4    Period  Weeks    Status  Achieved   IN CLINIC     Additional Short Term Goals   Additional Short Term Goals  Yes      OT SHORT TERM GOAL #6   Title  Pt to verbalize understanding and consistently demo correct reaching pattern LUE to prevent pain Lt shoulder    Time  4    Period  Weeks    Status  On-going      OT SHORT TERM GOAL #7   Title  Pt to improve coordination Lt hand as evidenced by performing 9 hole peg test in under 2 min. 30 sec.     Baseline  eval - placed 5 pegs in 2 min.,  08/23/18 - placed only 4 pegs in 2 min.    Time  4    Period  Weeks    Status  Revised      OT SHORT TERM GOAL #8   Title  Pt to demo 30 lbs grip strength Lt hand to assist  w/ opening containers    Baseline  eval 20 lbs, 08/23/18 = 25 lbs    Time  4    Period  Weeks    Status  New        OT Long Term Goals - 08/23/18 1310      OT LONG TERM GOAL #1   Title  Pt to perform functional mid to high level reaching LUE w/ only min compensations and no pain - 08/03/18    Time  8    Period  Weeks    Status  Partially Met   mid level only     OT LONG TERM GOAL #2   Title  Pt to improve Lt hand coordination as evidenced by performing 9 hole peg test in 2 min. or under    Time  8    Period  Weeks    Status  Revised      OT LONG TERM GOAL #3   Title  Pt to improve grip strength Lt hand to 40 lbs or greater to assist w/ opening jars/containers    Baseline  eval: 20 lbs,  08/23/18 = 25 lbs    Time  8    Period  Weeks    Status  On-going      OT LONG TERM GOAL #4   Title  Pt to demo full composite flexion Lt hand for coordination/grasping tasks    Baseline  eval: 75%,  08/23/18: 85%    Time  8    Period  Weeks    Status  On-going      OT LONG TERM GOAL #5   Title  Pt to be able to tie shoe laces and hook/unhook buttons  using Lt hand as assist    Time  8    Period  Weeks    Status  New      OT LONG TERM GOAL #6   Title  Pt to return to simple sandwich prep and cleaning tasks from standing level w/o LOB using countertop support prn    Time  8    Period  Weeks    Status  Revised      OT LONG TERM GOAL #7   Title  Pt to return to financial management tasks w/ supervision prn    Time  8    Period  Weeks    Status  Deferred            Plan - 08/31/18 1434    Clinical Impression Statement  Pt gradually progressing, however limited by apraxia, decr proprioception and difficulty following verbal directions    Occupational Profile and client history currently impacting functional performance  PMH; HTN, HLD, DM, A-fib. Current deficits impeding pt's ability to perform ADLS, IADLS, and role as husband, friend, and leisure participation    Occupational  performance deficits (Please refer to evaluation for details):  ADL's;IADL's;Leisure    Body Structure / Function / Physical Skills  ADL;Decreased knowledge of precautions;ROM;UE functional use;FMC;Decreased knowledge of use of DME;Balance;Sensation;Endurance;Pain;Strength;Coordination;IADL;Proprioception;Tone    Cognitive Skills  Attention;Safety Awareness;Memory    Rehab Potential  Good    OT Frequency  2x / week    OT Duration  8 weeks    OT Treatment/Interventions  Self-care/ADL training;Moist Heat;DME and/or AE instruction;Splinting;Therapeutic activities;Psychosocial skills training;Aquatic Therapy;Therapeutic exercise;Cognitive remediation/compensation;Coping strategies training;Neuromuscular education;Functional Mobility Training;Passive range of motion;Visual/perceptual remediation/compensation;Manual Therapy;Patient/family education;Electrical Stimulation    Plan  continue NMR and coordination LUE    Consulted and Agree with Plan of Care  Patient       Patient will benefit from skilled therapeutic intervention in order to improve the following deficits and impairments:  Body Structure / Function / Physical Skills, Cognitive Skills  Visit Diagnosis: Hemiplegia and hemiparesis following other nontraumatic intracranial hemorrhage affecting left non-dominant side (HCC)  Other disturbances of skin sensation  Muscle weakness (generalized)    Problem List Patient Active Problem List   Diagnosis Date Noted  . Spastic hemiplegia affecting nondominant side ([place]) 07/07/2018  . Abnormality of gait 06/09/2018  . Neuropathic pain   . Labile blood pressure   . PAF (paroxysmal atrial fibrillation) ([place])   . Hemiparesis affecting left side as late effect of stroke ([place])   . Thalamic hemorrhage ([place]) 03/17/2018  . Benign essential HTN   . Dyslipidemia   . Hemorrhagic stroke ([place])   . ICH (intracerebral hemorrhage) ([place]) 03/12/2018  . Prostate cancer ([place]) 01/29/2017  . Cancer of trigone  of urinary bladder ([place]) 01/29/2017  . Diabetes ([place]) 12/14/2015  . PCP NOTES >>>>>>>>>>>>>>>>>>>>>>>>>>>>>>. 08/06/2015  . Dizziness and giddiness 01/29/2015  . Umbilical hernia 40/98/1191  . Elevated PSA, less than 10 ng/ml 04/19/2012  . Annual physical exam 01/24/2011  . Hyperlipidemia 01/03/2010  . Essential hypertension 09/20/2007    Carey Bullocks, OTR/L 08/31/2018, 2:35 PM  [facility] [address] [place], [place], [address] Phone: [phone]   Fax:  [phone]  Name: NOELL SHULAR MRN: [id number] Date of Birth: 1945/10/24

## 2018-08-31 NOTE — Patient Instructions (Signed)
   Look for a straight cane with a rubber quad tip   If you find it, bring it in next session so we can practice and get it the correct height

## 2018-09-02 ENCOUNTER — Ambulatory Visit: Payer: Medicare Other | Admitting: Occupational Therapy

## 2018-09-02 ENCOUNTER — Ambulatory Visit: Payer: Medicare Other | Admitting: Physical Therapy

## 2018-09-02 DIAGNOSIS — R208 Other disturbances of skin sensation: Secondary | ICD-10-CM | POA: Diagnosis not present

## 2018-09-02 DIAGNOSIS — I69254 Hemiplegia and hemiparesis following other nontraumatic intracranial hemorrhage affecting left non-dominant side: Secondary | ICD-10-CM | POA: Diagnosis not present

## 2018-09-02 DIAGNOSIS — R2689 Other abnormalities of gait and mobility: Secondary | ICD-10-CM

## 2018-09-02 DIAGNOSIS — R2681 Unsteadiness on feet: Secondary | ICD-10-CM

## 2018-09-02 DIAGNOSIS — I69118 Other symptoms and signs involving cognitive functions following nontraumatic intracerebral hemorrhage: Secondary | ICD-10-CM | POA: Diagnosis not present

## 2018-09-02 DIAGNOSIS — M6281 Muscle weakness (generalized): Secondary | ICD-10-CM | POA: Diagnosis not present

## 2018-09-02 DIAGNOSIS — R278 Other lack of coordination: Secondary | ICD-10-CM | POA: Diagnosis not present

## 2018-09-03 NOTE — Therapy (Signed)
[facility] [address] [place], [place], [address] Phone: [phone]   Fax:  [phone]  Physical Therapy Treatment  Patient Details  Name: Marcus Beasley MRN: [id number] Date of Birth: 1945-09-18 No data recorded  Encounter Date: 09/02/2018  PT End of Session - 09/03/18 1050    Visit Number  22    Number of Visits  32    Date for PT Re-Evaluation  11/03/18    Authorization Type  [employer] Medicare; will need 10th visit progress notes    PT Start Time  0847    PT Stop Time  0932    PT Time Calculation (min)  45 min    Equipment Utilized During Treatment  Gait belt   wearing his Thusane PLS AFO   Activity Tolerance  Patient tolerated treatment well    Behavior During Therapy  WFL for tasks assessed/performed       Past Medical History:  Diagnosis Date  . BPH (benign prostatic hyperplasia)    (-) Bx 2015  . Diabetes mellitus without complication ([place])   . Elevated PSA    Prostate Bx in 06/2013 was benign  . HTN (hypertension)   . Hyperlipidemia   . Macular degeneration, age related     Past Surgical History:  Procedure Laterality Date  . ABCESS DRAINAGE     abdomen- 26 day hospitalization 1968  . COLONOSCOPY  2016  . HERNIA REPAIR  summer '11   umbilical, dr Ninfa Linden   . POLYPECTOMY    . PROSTATE BIOPSY  06-2013 , 07-2017   (-), (-)    There were no vitals filed for this visit.  Subjective Assessment - 09/03/18 1038    Subjective  No changes.  Ordered my cane from [employer], but it hasn't come yet.    Patient is accompained by:  Family member   friend   Limitations  Walking;Standing    Patient Stated Goals  Pt wants to be able to walk again.  Wants to be able to use L hand and arm again; wants to drive, do yardwork.    Currently in Pain?  No/denies                       [facility] Adult PT Treatment/Exercise - 09/03/18 0001      Ambulation/Gait   Ambulation/Gait  Yes    Ambulation/Gait  Assistance  5: Supervision    Ambulation Distance (Feet)  345 Feet   230 ft, 100 ft   Assistive device  Straight cane;None    Gait Pattern  Step-through pattern;Decreased step length - left;Decreased stance time - left;Decreased dorsiflexion - left;Left flexed knee in stance;Trunk rotated posteriorly on left;Narrow base of support    Ambulation Surface  Level;Indoor    Gait velocity  19.09 sec = 1.71 ft/sec    Gait Comments  Discussed with patient and friend, beginning to use cane at home, room to room with supervision; if he is alone, instructed pt to walk with walker.      Berg Balance Test   Sit to Stand  Able to stand without using hands and stabilize independently    Standing Unsupported  Able to stand safely 2 minutes    Sitting with Back Unsupported but Feet Supported on Floor or Stool  Able to sit safely and securely 2 minutes    Stand to Sit  Sits safely with minimal use of hands    Transfers  Able to transfer safely, minor  use of hands    Standing Unsupported with Eyes Closed  Able to stand 10 seconds safely    Standing Ubsupported with Feet Together  Able to place feet together independently and stand 1 minute safely    From Standing, Reach Forward with Outstretched Arm  Can reach forward >12 cm safely (5")    From Standing Position, Pick up Object from [employer] to pick up shoe safely and easily    From Standing Position, Turn to Look Behind Over each Shoulder  Looks behind from both sides and weight shifts well    Turn 360 Degrees  Able to turn 360 degrees safely but slowly    Standing Unsupported, Alternately Place Feet on Step/Stool  Able to complete >2 steps/needs minimal assist    Standing Unsupported, One Foot in Front  Able to plae foot ahead of the other independently and hold 30 seconds    Standing on One Leg  Able to lift leg independently and hold equal to or more than 3 seconds    Total Score  47      Dynamic Gait Index   Level Surface  Moderate Impairment     Change in Gait Speed  Mild Impairment    Gait with Horizontal Head Turns  Mild Impairment    Gait with Vertical Head Turns  Moderate Impairment    Gait and Pivot Turn  Mild Impairment    Step Over Obstacle  Moderate Impairment    Step Around Obstacles  Moderate Impairment    Steps  Mild Impairment    Total Score  12             PT Education - 09/03/18 1048    Education Details  Use of cane at home with close supervision of family; progress towards goals (improved DGI and Berg; still at fall risk per DGI)    Person(s) Educated  Patient;Other (comment)   friend   Methods  Explanation    Comprehension  Verbalized understanding       PT Short Term Goals - 09/03/18 1049      PT SHORT TERM GOAL #1   Title  Pt will be independent with progression of HEP for improved strength, balance, gait.  TARGET 09/03/2018    Time  4    Period  Weeks    Status  New    Target Date  09/03/18      PT SHORT TERM GOAL #2   Title  Pt will improve DGI score to at least 12/24 for decreased fall risk.    Baseline  9/24 08/05/2018; 12/24 09/03/2018    Time  4    Period  Weeks    Status  Achieved    Target Date  09/03/18      PT SHORT TERM GOAL #3   Title  Pt will improve Berg Balance score to at least 47/56 for decreased fall risk.    Baseline  42/56 07/29/2018; 47/56 09/03/2018    Time  4    Period  Weeks    Status  Achieved    Target Date  09/03/18      PT SHORT TERM GOAL #4   Title  Pt will improve TUG score to less than or equal to 20 seconds for decreased fall risk.    Baseline  24.47 sec cane 07/29/2018; 18.88 08/24/2018    Time  4    Period  Weeks    Status  Achieved    Target Date  09/03/18      PT SHORT TERM GOAL #5   Title  Pt will improve gait velocity to at least 1.8 ft/sec with cane, for improved gait efficiency/decreased fall risk.    Baseline  1.71 ft/sec 09/03/2018    Time  4    Period  Weeks    Status  Partially Met    Target Date  09/03/18        PT Long Term Goals -  08/05/18 1814      PT LONG TERM GOAL #1   Title  Pt will verbalize plans for continued community fitness upon d/c from PT.  TARGET 10/01/2018    Time  8    Period  Weeks    Status  On-going    Target Date  10/01/18      PT LONG TERM GOAL #2   Title  Pt will improve gait velocity to at least 2.2 ft/sec for improved gait efficiency and safety.    Baseline  1.6 ft/sec cane 07/29/2018    Time  8    Period  Weeks    Status  New    Target Date  10/01/18      PT LONG TERM GOAL #3   Title  Pt will improve DGI score to at least 17/24 for decreased fall risk    Baseline  9/24 08/05/2018    Time  8    Period  Weeks    Status  New    Target Date  10/01/18      PT LONG TERM GOAL #4   Title  Pt will improve TUG score to less than or equal to 15 seconds for decreased fall risk.    Baseline  24.47 sec at best with cane (improved from 28.84 sec)-07/29/2018    Time  8    Period  Weeks    Status  New    Target Date  10/01/18      PT LONG TERM GOAL #5   Title  Pt will ambulate at least 5000 ft using cane. modified independently, for improved independence with gait on indoor and outdoor surfaces    Baseline       Time  8    Period  Weeks    Status  New    Target Date  10/01/18      PT LONG TERM GOAL #6   Title  Pt will negotiate at least 12 steps, one handrail, step-through pattern, modified independently, for improved stair negotiation in home.    Baseline  step through pattern with min guard 08/05/2018    Time  8    Period  Weeks    Status  New    Target Date  10/01/18            Plan - 09/03/18 1051    Clinical Impression Statement  Assessed most STGs this visit (EPIC down, so PT went by memory on which goals needed to be checked).  Pt has met STG 2 for Berg, STG 3 for DGI, STG 4 for TUG.  Pt has partially met STG 5 for gait velocity, but not met goal; gt velocity with cane has improved from 1.6 ft/sec with cane to 1.71 ft/sec.  Pt is progressing with gait and mobility wtih use of  cane in clinic and has been instructed in cane use for home, with family/friend supervision.  Pt is still at fall risk per DGI, TUG, and gait velocity scores and will continue to benefit from skilled  PT to further address balance, strength, gait.    Rehab Potential  Good    Clinical Impairments Affecting Rehab Potential  good family support; independnet prior to CVA    PT Frequency  2x / week    PT Duration  8 weeks   plus eval   PT Treatment/Interventions  ADLs/Self Care Home Management;Electrical Stimulation;Therapeutic exercise;Therapeutic activities;Functional mobility training;Gait training;Stair training;DME Instruction;Balance training;Neuromuscular re-education;Patient/family education;Orthotic Fit/Training;Manual techniques    PT Next Visit Plan  Check HEP/STG 1; NMR, balance, gait (?try treadmill?); tall kneeling, half-kneeling for trunk/hip stability, prone again for hamstring and gluts; if pt brings cane, work on sizing and instruction for short distance use at home with close supervision    Consulted and Agree with Plan of Care  Patient;Family member/caregiver    Family Member [employer], friend       Patient will benefit from skilled therapeutic intervention in order to improve the following deficits and impairments:  Abnormal gait, Decreased balance, Decreased mobility, Difficulty walking, Decreased strength, Impaired tone, Postural dysfunction  Visit Diagnosis: Other abnormalities of gait and mobility  Unsteadiness on feet  Muscle weakness (generalized)     Problem List Patient Active Problem List   Diagnosis Date Noted  . Spastic hemiplegia affecting nondominant side ([place]) 07/07/2018  . Abnormality of gait 06/09/2018  . Neuropathic pain   . Labile blood pressure   . PAF (paroxysmal atrial fibrillation) ([place])   . Hemiparesis affecting left side as late effect of stroke ([place])   . Thalamic hemorrhage ([place]) 03/17/2018  . Benign essential HTN   . Dyslipidemia   .  Hemorrhagic stroke ([place])   . ICH (intracerebral hemorrhage) ([place]) 03/12/2018  . Prostate cancer ([place]) 01/29/2017  . 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 Diabetes ([place]) 12/14/2015  . PCP NOTES >>>>>>>>>>>>>>>>>>>>>>>>>>>>>>. 08/06/2015  . Dizziness and giddiness 01/29/2015  . Umbilical hernia 22/41/1464  . Elevated PSA, less than 10 ng/ml 04/19/2012  . Annual physical exam 01/24/2011  . Hyperlipidemia 01/03/2010  . Essential hypertension 09/20/2007    Janie Strothman W. 09/03/2018, 1:29 PM  Frazier Butt., PT  [facility] [address] [place], [place], [address] Phone: [phone]   Fax:  [phone]  Name: Marcus Beasley MRN: [id number] Date of Birth: [birth year]

## 2018-09-07 ENCOUNTER — Ambulatory Visit: Payer: Medicare Other | Admitting: Physical Therapy

## 2018-09-07 ENCOUNTER — Ambulatory Visit: Payer: Medicare Other | Admitting: Occupational Therapy

## 2018-09-08 ENCOUNTER — Ambulatory Visit: Payer: Medicare Other | Admitting: Physical Therapy

## 2018-09-08 ENCOUNTER — Ambulatory Visit: Payer: Medicare Other | Admitting: Occupational Therapy

## 2018-09-09 ENCOUNTER — Encounter: Payer: Medicare Other | Admitting: Occupational Therapy

## 2018-09-09 ENCOUNTER — Ambulatory Visit: Payer: Medicare Other | Admitting: Occupational Therapy

## 2018-09-09 ENCOUNTER — Ambulatory Visit: Payer: Medicare Other | Admitting: Physical Therapy

## 2018-09-10 ENCOUNTER — Encounter: Payer: Self-pay | Admitting: Physical Medicine & Rehabilitation

## 2018-09-10 ENCOUNTER — Encounter: Payer: Medicare Other | Admitting: Physical Medicine & Rehabilitation

## 2018-09-10 ENCOUNTER — Other Ambulatory Visit: Payer: Self-pay

## 2018-09-10 VITALS — BP 131/88 | HR 80 | Ht 71.0 in | Wt 166.0 lb

## 2018-09-10 DIAGNOSIS — R269 Unspecified abnormalities of gait and mobility: Secondary | ICD-10-CM | POA: Diagnosis not present

## 2018-09-10 DIAGNOSIS — M25512 Pain in left shoulder: Secondary | ICD-10-CM | POA: Diagnosis not present

## 2018-09-10 DIAGNOSIS — G8929 Other chronic pain: Secondary | ICD-10-CM | POA: Diagnosis not present

## 2018-09-10 DIAGNOSIS — G8110 Spastic hemiplegia affecting unspecified side: Secondary | ICD-10-CM | POA: Diagnosis not present

## 2018-09-10 DIAGNOSIS — E119 Type 2 diabetes mellitus without complications: Secondary | ICD-10-CM | POA: Diagnosis not present

## 2018-09-10 DIAGNOSIS — M7552 Bursitis of left shoulder: Secondary | ICD-10-CM | POA: Insufficient documentation

## 2018-09-10 DIAGNOSIS — I10 Essential (primary) hypertension: Secondary | ICD-10-CM | POA: Diagnosis not present

## 2018-09-10 NOTE — Progress Notes (Signed)
Subjective:    Patient ID: Marcus Beasley, male    DOB: Nov 20, 1945, 73 y.o.   MRN: [id number]  HPI 73 year old right-handed male with history of diabetes mellitus, hypertension presents for follow up for right thalamic hemorrhage.   Last clinic visit he had 08/04/2018.  He had subacromial injection at that time.  Family supplements history. Poor historian. He states, therapies are on hold.  He notes significant improvement in pain with injection.  Denies falls.  Pain Inventory Average Pain 0 Pain Right Now 0 My pain is constant and tingling  In the last 24 hours, has pain interfered with the following? General activity 0 Relation with others 0 Enjoyment of life 0 What TIME of day is your pain at its worst? na Sleep (in general) Fair  Pain is worse with: unsure Pain improves with: medication Relief from Meds: 0  Mobility walk with assistance use a walker Do you have any goals in this area?  yes  Function retired  Neuro/Psych numbness tingling trouble walking  Prior Studies Any changes since last visit?  no  Physicians involved in your care Any changes since last visit?  no   Family History  Problem Relation Age of Onset  . Leukemia Mother   . Heart attack Father        MI age 62  . Heart disease Sister        age 23  . Cancer - Other Sister        type  . Heart disease Brother        age 48  . Kidney disease Brother        HD  . Diabetes Maternal Grandmother   . Prostate cancer Neg Hx   . Colon cancer Neg Hx   . Esophageal cancer Neg Hx   . Rectal cancer Neg Hx   . Stomach cancer Neg Hx    Social History   Socioeconomic History  . Marital status: Married    Spouse name: Not on file  . Number of children: 3  . Years of education: 49  . Highest education level: Not on file  Occupational History  . 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 Financial resource strain: Not on file  . Food  insecurity:    Worry: Not on file    Inability: Not on file  . Transportation needs:    Medical: Not on file    Non-medical: Not on file  Tobacco Use  . Smoking status: Never Smoker  . Smokeless tobacco: Never Used  Substance and Sexual Activity  . Alcohol use: Yes    Comment: occ. beer  . Drug use: No  . Sexual activity: Yes    Partners: Female  Lifestyle  . Physical activity:    Days per week: Not on file    Minutes per session: Not on file  . Stress: Not on file  Relationships  . Social connections:    Talks on phone: Not on file    Gets together: Not on file    Attends religious service: Not on file    Active member of club or organization: Not on file    Attends meetings of clubs or organizations: Not on file    Relationship status: Not on file  Other Topics Concern  . 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 2 dtrs , 1 son - homicide. 5  grandchildren.     Past Surgical History:  Procedure Laterality Date  . ABCESS DRAINAGE     abdomen- 26 day hospitalization 1968  . COLONOSCOPY  2016  . HERNIA REPAIR  summer '11   umbilical, dr Ninfa Linden   . POLYPECTOMY    . PROSTATE BIOPSY  06-2013 , 07-2017   (-), (-)   Past Medical History:  Diagnosis Date  . BPH (benign prostatic hyperplasia)    (-) Bx 2015  . Diabetes mellitus without complication ([place])   . Elevated PSA    Prostate Bx in 06/2013 was benign  . HTN (hypertension)   . Hyperlipidemia   . Macular degeneration, age related    BP 131/88   Pulse 80   Ht 5\' 11"  (1.803 m)   Wt 166 lb (75.3 kg)   SpO2 97%   BMI 23.15 kg/m   Opioid Risk Score:   Fall Risk Score:  `1  Depression screen PHQ 2/9  Depression screen [facility] 2/9 04/28/2018 08/25/2017 04/14/2016 12/14/2015 08/06/2015 07/31/2014  Decreased Interest 0 0 0 0 0 0  Down, Depressed, Hopeless 0 0 0 0 0 0  PHQ - 2 Score 0 0 0 0 0 0     Review of Systems  Constitutional: Negative.   HENT: Negative.   Eyes:  Negative.   Respiratory: Negative.   Cardiovascular: Negative.   Gastrointestinal: Negative.   Endocrine: Negative.   Genitourinary: Negative.   Musculoskeletal: Positive for gait problem.  Skin: Negative.   Allergic/Immunologic: Negative.   Neurological: Positive for numbness.  Hematological: Negative.   Psychiatric/Behavioral: Negative.   All other systems reviewed and are negative.     Objective:   Physical Exam Constitutional: No distress . Vital signs reviewed. HENT: Normocephalic.  Atraumatic. Eyes: EOMI. No discharge. Cardiovascular:  RRR. No JVD. Respiratory: CTA  bilaterally. Normal effort. GI: BS +. Non-distended. Musc: left hand edema, stable Neurological: He is alert and oriented Follows basic commands  Motor:  LUE: Shoulder abduction 4/5, elbow flex 4+/5, elbow extension 4/5, hand grip 3+/5 with apraxia, stable LLE: HF, KE, ADF 4+/5 Mas: left elbow flexors: 2/4, limited due to patient resistance Skin: Skin is warm and dry.  Psychiatric: He has a normal mood and affect. His behavior is normal. Thought content normal.     Assessment & Plan:  73 year old right-handed male with history of diabetes mellitus, hypertension presents for follow up for right thalamic hemorrhage.   1. Left-sided weakness secondary to right thalamic hemorrhage secondary to hypertensive crisis now with spasticity  Resume therapies when appropriate  Cont follow up with Neurology  Will schedule for Botulinum toxin injection   Left Biceps: 75 units   Left upper traps: 25 units   2. Pain Management:   No benefit with Gabapentin  Tylenol as needed  Will consider Elavil 10 qhs, pt does not want at present  Cont Baclofen 20 TID, pt does not want to increase at present  D/ced Tramadol  Encouraged ROM  3. Gait abnormality  Resume therapies when appropriate  Cont walker/wheelchair for safety  4. Post stroke shoulder pain  Good benefit with pain in shoulder

## 2018-09-13 ENCOUNTER — Ambulatory Visit: Payer: Medicare Other | Admitting: Occupational Therapy

## 2018-09-13 ENCOUNTER — Telehealth: Payer: Self-pay | Admitting: Physical Therapy

## 2018-09-13 DIAGNOSIS — I610 Nontraumatic intracerebral hemorrhage in hemisphere, subcortical: Secondary | ICD-10-CM | POA: Diagnosis not present

## 2018-09-13 DIAGNOSIS — M792 Neuralgia and neuritis, unspecified: Secondary | ICD-10-CM | POA: Diagnosis not present

## 2018-09-13 DIAGNOSIS — I69354 Hemiplegia and hemiparesis following cerebral infarction affecting left non-dominant side: Secondary | ICD-10-CM | POA: Diagnosis not present

## 2018-09-13 NOTE — Telephone Encounter (Signed)
Mr. Lenoir was contacted today regarding the temporary closing of [facility] due to Covid-19.  Therapist discussed:  Importance of continuing full HEP, walking with cane with family assistance.  OT spoke with patient about continuing HEP appropriately.  Patient is potentially interested in further information for an e-visit, virtual check in, or telehealth visit, if those services become available.    [facility] will follow up with patients when we are able to resume care.  Mady Haagensen, [facility] [address] [place], [place]  [address] Phone:  [phone] Fax:  [phone] \

## 2018-09-15 ENCOUNTER — Ambulatory Visit: Payer: Medicare Other | Admitting: Occupational Therapy

## 2018-09-21 ENCOUNTER — Ambulatory Visit: Payer: Medicare Other | Admitting: Occupational Therapy

## 2018-09-21 ENCOUNTER — Ambulatory Visit: Payer: Medicare Other | Admitting: Physical Therapy

## 2018-09-23 ENCOUNTER — Ambulatory Visit: Payer: Medicare Other | Admitting: Physical Therapy

## 2018-09-23 ENCOUNTER — Encounter: Payer: Medicare Other | Admitting: Occupational Therapy

## 2018-09-27 ENCOUNTER — Ambulatory Visit: Payer: Medicare Other | Admitting: Physical Therapy

## 2018-09-27 ENCOUNTER — Encounter: Payer: Medicare Other | Admitting: Occupational Therapy

## 2018-09-27 ENCOUNTER — Telehealth: Payer: Self-pay | Admitting: Physical Therapy

## 2018-09-27 NOTE — Telephone Encounter (Signed)
Marcus Beasley was contacted today regarding temporary reduction of [facility]patient [facility] due to concerns for community transmission of COVID-19.  Patient identity was verified.  Assessed if patient needed to be seen in person by clinician   Patient did not have an acute/special need that requires in person visit. Proceeded with phone call.  Therapist advised the patient to continue to perform his/her HEP and assured he/she had no unanswered questions or concerns at this time.   The patient was offered and declined the continuation of their plan of care by using methods such as an E-Visit, virtual check in, or Telehealth visit.  [facility] will follow up with this client when we are able to safely resume care at the [facility] in person.   Patient is aware we can be reached by telephone during limited business hours in the meantime.   Mady Haagensen, PT 09/27/18 2:24 PM Phone: [phone] Fax: [phone]

## 2018-09-28 ENCOUNTER — Other Ambulatory Visit: Payer: Self-pay | Admitting: Physical Medicine & Rehabilitation

## 2018-09-28 NOTE — Telephone Encounter (Signed)
Rx request for Baclofen TID, last visit 09/10/2018 states Baclofen BID patient does not want to increase at this time. Okay to fill TID?

## 2018-09-29 ENCOUNTER — Encounter: Payer: Medicare Other | Admitting: Occupational Therapy

## 2018-09-29 ENCOUNTER — Ambulatory Visit: Payer: Medicare Other | Admitting: Physical Therapy

## 2018-10-05 ENCOUNTER — Ambulatory Visit: Payer: Medicare Other | Admitting: Physical Therapy

## 2018-10-05 ENCOUNTER — Encounter: Payer: Medicare Other | Admitting: Occupational Therapy

## 2018-10-06 ENCOUNTER — Encounter: Payer: Self-pay | Admitting: Physical Medicine & Rehabilitation

## 2018-10-06 ENCOUNTER — Other Ambulatory Visit: Payer: Self-pay

## 2018-10-06 ENCOUNTER — Encounter: Payer: Medicare Other | Attending: Physical Medicine & Rehabilitation | Admitting: Physical Medicine & Rehabilitation

## 2018-10-06 DIAGNOSIS — G8929 Other chronic pain: Secondary | ICD-10-CM

## 2018-10-06 DIAGNOSIS — M792 Neuralgia and neuritis, unspecified: Secondary | ICD-10-CM

## 2018-10-06 DIAGNOSIS — M25512 Pain in left shoulder: Secondary | ICD-10-CM | POA: Diagnosis not present

## 2018-10-06 DIAGNOSIS — E119 Type 2 diabetes mellitus without complications: Secondary | ICD-10-CM | POA: Insufficient documentation

## 2018-10-06 DIAGNOSIS — I69354 Hemiplegia and hemiparesis following cerebral infarction affecting left non-dominant side: Secondary | ICD-10-CM

## 2018-10-06 DIAGNOSIS — G8110 Spastic hemiplegia affecting unspecified side: Secondary | ICD-10-CM | POA: Diagnosis not present

## 2018-10-06 DIAGNOSIS — R269 Unspecified abnormalities of gait and mobility: Secondary | ICD-10-CM

## 2018-10-06 DIAGNOSIS — I10 Essential (primary) hypertension: Secondary | ICD-10-CM | POA: Insufficient documentation

## 2018-10-06 MED ORDER — BACLOFEN 20 MG PO TABS: 30.0000 mg | ORAL_TABLET | Freq: Three times a day (TID) | ORAL | 1 refills | Status: AC

## 2018-10-06 MED ORDER — GABAPENTIN 600 MG PO TABS: 900.0000 mg | ORAL_TABLET | Freq: Three times a day (TID) | ORAL | 0 refills | Status: DC

## 2018-10-06 NOTE — Progress Notes (Signed)
Subjective:    Patient ID: Marcus Beasley, male    DOB: March 14, 1946, 73 y.o.   MRN: [id number]  TELEHEALTH NOTE  Due to national recommendations of social distancing due to COVID 19, an audio/video telehealth visit is felt to be most appropriate for this patient at this time.  See Chart message from today for the patient's consent to telehealth from [place].     I verified that I am speaking with the correct person using two identifiers.  Location of patient: Home Location of provider: Office Method of communication: Telephone Names of participants : [name] scheduling, Wenda Overland obtaining consent and vitals if available Established patient Time spent on call: 13 minutes   HPI  73 year old right-handed male with history of diabetes mellitus, hypertension presents for follow up for right thalamic hemorrhage.   Last clinic visit he had 09/10/2018.  Wife supplements history. Since that time, therapies have not resumed.  He notes tingling. He feels Gabapentin now was effective. Botox on hold. He is taking Baclofen. Sleep is fair. Denies falls.   Pain Inventory Average Pain 0 Pain Right Now 0 My pain is constant, tingling and heaviness left side  In the last 24 hours, has pain interfered with the following? General activity 0 Relation with others 0 Enjoyment of life 0 What TIME of day is your pain at its worst? na Sleep (in general) Fair  Pain is worse with: no pain Pain improves with: medication and no pain Relief from Meds: no pain  Mobility walk with assistance use a cane ability to climb steps?  yes Do you have any goals in this area?  yes  Function retired I need assistance with the following:  meal prep, household duties and shopping  Neuro/Psych bladder control problems weakness numbness tingling trouble walking spasms  Prior Studies Any changes since last visit?  no  Physicians involved in your care Any  changes since last visit?  no   Family History  Problem Relation Age of Onset  . Leukemia Mother   . Heart attack Father        MI age 69  . Heart disease Sister        age 65  . Cancer - Other Sister        type  . Heart disease Brother        age 31  . Kidney disease Brother        HD  . Diabetes Maternal Grandmother   . Prostate cancer Neg Hx   . Colon cancer Neg Hx   . Esophageal cancer Neg Hx   . Rectal cancer Neg Hx   . Stomach cancer Neg Hx    Social History   Socioeconomic History  . Marital status: Married    Spouse name: Not on file  . Number of children: 3  . Years of education: 37  . Highest education level: Not on file  Occupational History  . 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 Financial resource strain: Not on file  . Food insecurity:    Worry: Not on file    Inability: Not on file  . Transportation needs:    Medical: Not on file    Non-medical: Not on file  Tobacco Use  . Smoking status: Never Smoker  . Smokeless tobacco: Never Used  Substance and Sexual Activity  . Alcohol use: Yes    Comment: occ. beer  .  Drug use: No  . Sexual activity: Yes    Partners: Female  Lifestyle  . Physical activity:    Days per week: Not on file    Minutes per session: Not on file  . Stress: Not on file  Relationships  . Social connections:    Talks on phone: Not on file    Gets together: Not on file    Attends religious service: Not on file    Active member of club or organization: Not on file    Attends meetings of clubs or organizations: Not on file    Relationship status: Not on file  Other Topics Concern  . 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 2 dtrs , 1 son - homicide. 5 grandchildren.     Past Surgical History:  Procedure Laterality Date  . ABCESS DRAINAGE     abdomen- 26 day hospitalization 1968  . COLONOSCOPY  2016  . HERNIA REPAIR   summer '11   umbilical, dr Ninfa Linden   . POLYPECTOMY    . PROSTATE BIOPSY  06-2013 , 07-2017   (-), (-)   Past Medical History:  Diagnosis Date  . BPH (benign prostatic hyperplasia)    (-) Bx 2015  . Diabetes mellitus without complication ([place])   . Elevated PSA    Prostate Bx in 06/2013 was benign  . HTN (hypertension)   . Hyperlipidemia   . Macular degeneration, age related    There were no vitals taken for this visit.  Opioid Risk Score:   Fall Risk Score:  `1  Depression screen PHQ 2/9  Depression screen [facility] 2/9 10/06/2018 04/28/2018 08/25/2017 04/14/2016 12/14/2015 08/06/2015 07/31/2014  Decreased Interest 0 0 0 0 0 0 0  Down, Depressed, Hopeless 0 0 0 0 0 0 0  PHQ - 2 Score 0 0 0 0 0 0 0     Review of Systems  Constitutional: Positive for appetite change and unexpected weight change.       Appetite poor and feels like he is still losing weight. (has not weighed since last visit)  HENT: Negative.   Eyes: Negative.   Respiratory: Negative.   Cardiovascular: Negative.   Gastrointestinal: Negative.   Endocrine: Negative.   Genitourinary: Positive for difficulty urinating.       Hesitancy  Musculoskeletal: Positive for gait problem.  Skin: Negative.   Allergic/Immunologic: Negative.   Neurological: Positive for weakness and numbness.       Tingling  Hematological: Bruises/bleeds easily.       On eliquis  Psychiatric/Behavioral: Negative.   All other systems reviewed and are negative.     Objective:   Physical Exam Gen: NAD. Pulm: Effort normal Neuro: Alert and oriented    Assessment & Plan:  73 year old right-handed male with history of diabetes mellitus, hypertension presents for follow up for right thalamic hemorrhage.   1. Left-sided weakness secondary to right thalamic hemorrhage secondary to hypertensive crisis now with spasticity  Resume therapies when appropriate  Cont follow up with Neurology  Will schedule for Botulinum toxin injection in future:   Left  Biceps: 75 units   Left upper traps: 25 units   2. Pain Management:   Tylenol as needed  Will consider Elavil 10 qhs, pt does not want at present  Will increase Baclofen to 30 TID  D/ced Tramadol  Encouraged ROM  Now believes he was benefiting from Gabapentin, will order 300 TID, increase by 300 mg/week to 900mg   3. Gait abnormality  Resume therapies when appropriate  Cont walker/wheelchair for safety  4. Post stroke shoulder pain  Good benefit with pain in shoulder

## 2018-10-07 ENCOUNTER — Encounter: Payer: Medicare Other | Admitting: Occupational Therapy

## 2018-10-07 ENCOUNTER — Ambulatory Visit: Payer: Medicare Other | Admitting: Physical Therapy

## 2018-10-11 ENCOUNTER — Other Ambulatory Visit: Payer: Self-pay

## 2018-10-11 MED ORDER — DILTIAZEM HCL ER COATED BEADS 240 MG PO CP24: 240.0000 mg | ORAL_CAPSULE | Freq: Every day | ORAL | 1 refills | Status: DC

## 2018-10-14 DIAGNOSIS — M792 Neuralgia and neuritis, unspecified: Secondary | ICD-10-CM | POA: Diagnosis not present

## 2018-10-14 DIAGNOSIS — I69354 Hemiplegia and hemiparesis following cerebral infarction affecting left non-dominant side: Secondary | ICD-10-CM | POA: Diagnosis not present

## 2018-10-14 DIAGNOSIS — I610 Nontraumatic intracerebral hemorrhage in hemisphere, subcortical: Secondary | ICD-10-CM | POA: Diagnosis not present

## 2018-10-21 ENCOUNTER — Ambulatory Visit (INDEPENDENT_AMBULATORY_CARE_PROVIDER_SITE_OTHER): Payer: Medicare Other | Admitting: Adult Health

## 2018-10-21 ENCOUNTER — Other Ambulatory Visit: Payer: Self-pay

## 2018-10-21 ENCOUNTER — Encounter: Payer: Self-pay | Admitting: Adult Health

## 2018-10-21 ENCOUNTER — Telehealth: Payer: Self-pay

## 2018-10-21 DIAGNOSIS — I480 Paroxysmal atrial fibrillation: Secondary | ICD-10-CM | POA: Diagnosis not present

## 2018-10-21 DIAGNOSIS — E119 Type 2 diabetes mellitus without complications: Secondary | ICD-10-CM | POA: Diagnosis not present

## 2018-10-21 DIAGNOSIS — I10 Essential (primary) hypertension: Secondary | ICD-10-CM

## 2018-10-21 DIAGNOSIS — G8114 Spastic hemiplegia affecting left nondominant side: Secondary | ICD-10-CM

## 2018-10-21 DIAGNOSIS — I610 Nontraumatic intracerebral hemorrhage in hemisphere, subcortical: Secondary | ICD-10-CM | POA: Diagnosis not present

## 2018-10-21 DIAGNOSIS — E785 Hyperlipidemia, unspecified: Secondary | ICD-10-CM

## 2018-10-21 DIAGNOSIS — I619 Nontraumatic intracerebral hemorrhage, unspecified: Secondary | ICD-10-CM

## 2018-10-21 DIAGNOSIS — M792 Neuralgia and neuritis, unspecified: Secondary | ICD-10-CM

## 2018-10-21 NOTE — Telephone Encounter (Signed)
I called pt to get verbal consent and to file insurance. Someone had wrote telephone consent in the appt  but did not documented a note in the computer. Pt gave verbal consent and to file insurance. Meds, PCP, and pharmacy updated.

## 2018-10-21 NOTE — Progress Notes (Signed)
I agree with the above plan 

## 2018-10-21 NOTE — Progress Notes (Signed)
[facility] [address] [place]. [place] [address] [phone]     Virtual Visit via Telephone Note  I connected with Marcus Beasley on 10/21/18 at  9:45 AM EDT by telephone located remotely within my own home and verified that I am speaking with the correct person using two identifiers who reports being located in his own home.    I discussed the limitations, risks, security and privacy concerns of performing an evaluation and management service by telephone and the availability of in person appointments. I also discussed with the patient that there may be a patient responsible charge related to this service. The patient expressed understanding and agreed to proceed.    History of Present Illness:  Marcus Beasley is a 73 y.o. male who has been followed in this office for right thalamic ICH in 03/12/2018.  He was initially scheduled for face-to-face office stroke follow-up visit today but due to [place], face-to-face office visit rescheduled for non-face-to-face telephone visit.   He has been stable from a stroke standpoint with residual deficits of left spastic hemiparesis. Feels as though weakness improving. Continues to have stiffness. He continues to follow with Dr. Posey Pronto at physical medicine and rehab and will be scheduling Botox injections for ongoing spasticity.  He also continues on baclofen and gabapentin.  He continues to ambulate with a cane and denies any recent falls.  Was previously using rolling walker.  Therapy on hold due to COVID-19 pandemic.  Plans on restarting once able. Continues to do exercises at home. He continues on Eliquis without side effects of bleeding or bruising.  Continues on atorvastatin without side effects myalgias.  Blood pressure checked yesterday at 125/87.  BG yesterday 139.  Losing weight currently at 165lb (174lb at prior visit in 06/2018).  No further concerns at this time.  Denies new or worsening stroke/TIA symptoms.    Observations/Objective:  General: Pleasant elderly African-American male asking and answering questions appropriately throughout conversation  Lipid Panel     Component Value Date/Time   CHOL 154 07/20/2018 1010   TRIG 101 07/20/2018 1010   HDL 43 07/20/2018 1010   CHOLHDL 3.6 07/20/2018 1010   CHOLHDL 4.1 03/13/2018 0400   VLDL 31 03/13/2018 0400   LDLCALC 91 07/20/2018 1010      Assessment and Plan:  Marcus Beasley is a 73 y.o. 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 Vascular risk factors include HTN, HLD and DM.  He has been stable from a stroke standpoint with residual deficits of spastic left hemiparesis.  1. Right thalamic and corona radiate ICH: Continue  Eliquis 5 mg twice daily  and atorvastatin 40 mg daily for secondary stroke prevention. Maintain strict control of hypertension with blood pressure goal below 130/90, diabetes with hemoglobin A1c goal below 6.5% and cholesterol with LDL cholesterol (bad cholesterol) goal below 70 mg/dL.  I also advised the patient to eat a healthy diet with plenty of whole grains, cereals, fruits and vegetables, exercise regularly with at least 30 minutes of continuous activity daily and maintain ideal body weight. 2. Spastic left hemiparesis:  Continue to do home exercises and restart therapy once able.  Continue to follow with Dr. Posey Pronto for pain management and future Botox injections for spasticity 3. PAF:  Continue Eliquis and ongoing follow-up with cardiology 4. HTN:   Continue current BP regimen. Continue to follow with cardiologist/PCP for HTN management 5. HLD: Advised to continue current treatment regimen  along with continued follow-up with PCP for future prescribing and monitoring of lipid panel.  6. DMII: Advised to continue to monitor glucose levels at home along with continued follow-up with PCP for management and monitoring   Follow Up Instructions:   Stable from  stroke standpoint and follows closely with PCP for risk factor management therefore recommend follow-up as needed but advised to call office with any questions or concerns regarding stroke management    I discussed the assessment and treatment plan with the patient.  The patient was provided an opportunity to ask questions and all were answered to their satisfaction. The patient agreed with the plan and verbalized an understanding of the instructions.   I provided 24 minutes of non-face-to-face time during this encounter.    Venancio Poisson, AGNP-BC  [facility] [address] [place], [place] [address]  Phone [phone] Fax [phone] Note: This document was prepared with digital dictation and possible smart phrase technology. Any transcriptional errors that result from this process are unintentional.

## 2018-10-21 NOTE — Telephone Encounter (Signed)
Patient daughter called late yesterday afternoon  stating that they had a miss call about dong a VV. I went over how the VV works but she stated that he will not be able to do that. So I then offered her the Telephone visit and she stated that he could do that. I verified the phone number with her that Janett Billow would be calling of [phone] and that she would be calling him at 9:45AM she understood.

## 2018-10-22 ENCOUNTER — Ambulatory Visit (INDEPENDENT_AMBULATORY_CARE_PROVIDER_SITE_OTHER): Payer: Medicare Other | Admitting: Internal Medicine

## 2018-10-22 ENCOUNTER — Encounter: Payer: Self-pay | Admitting: Internal Medicine

## 2018-10-22 ENCOUNTER — Other Ambulatory Visit: Payer: Self-pay

## 2018-10-22 VITALS — BP 140/89 | HR 85 | Temp 98.3°F | Ht 71.0 in | Wt 168.2 lb

## 2018-10-22 DIAGNOSIS — I10 Essential (primary) hypertension: Secondary | ICD-10-CM | POA: Diagnosis not present

## 2018-10-22 DIAGNOSIS — N390 Urinary tract infection, site not specified: Secondary | ICD-10-CM | POA: Diagnosis not present

## 2018-10-22 NOTE — Progress Notes (Signed)
Subjective:    Patient ID: Marcus Beasley, male    DOB: 04-Feb-1946, 73 y.o.   MRN: [id number]  DOS:  10/22/2018 Type of visit - description: Acute visit, face-to-face visit, here with his daughter.  The patient reported urinary frequency and urgency, a urine culture 07/2018 show E. coli, was Rx Cipro No. 20 capsule. Since then, symptoms have not changed. In fact in the last few days he is urinary frequency is even worse. When asked, admits that for the last 2 weeks the left testicle has been hurting and when asked he also admits that it swells on and off.  Review of Systems Denies fevers No chest pain no difficulty breathing No cough Mild dysuria after he urinates.  No penile discharge No gross hematuria, no difficulty urinating. Occasional right lower quadrant abdominal discomfort, on and off for months, nothing acute  Past Medical History:  Diagnosis Date  . BPH (benign prostatic hyperplasia)    (-) Bx 2015  . Diabetes mellitus without complication ([place])   . Elevated PSA    Prostate Bx in 06/2013 was benign  . HTN (hypertension)   . Hyperlipidemia   . Macular degeneration, age related     Past Surgical History:  Procedure Laterality Date  . ABCESS DRAINAGE     abdomen- 26 day hospitalization 1968  . COLONOSCOPY  2016  . HERNIA REPAIR  summer '11   umbilical, dr Ninfa Linden   . POLYPECTOMY    . PROSTATE BIOPSY  06-2013 , 07-2017   (-), (-)    Social History   Socioeconomic History  . Marital status: Married    Spouse name: Not on file  . Number of children: 3  . Years of education: 25  . Highest education level: Not on file  Occupational History  . 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 Financial resource strain: Not on file  . Food insecurity:    Worry: Not on file    Inability: Not on file  . Transportation needs:    Medical: Not on file    Non-medical: Not on file  Tobacco Use  . Smoking status: Never  Smoker  . Smokeless tobacco: Never Used  Substance and Sexual Activity  . Alcohol use: Yes    Comment: occ. beer  . Drug use: No  . Sexual activity: Yes    Partners: Female  Lifestyle  . Physical activity:    Days per week: Not on file    Minutes per session: Not on file  . Stress: Not on file  Relationships  . Social connections:    Talks on phone: Not on file    Gets together: Not on file    Attends religious service: Not on file    Active member of club or organization: Not on file    Attends meetings of clubs or organizations: Not on file    Relationship status: Not on file  . Intimate partner violence:    Fear of current or ex partner: Not on file    Emotionally abused: Not on file    Physically abused: Not on file    Forced sexual activity: Not on file  Other Topics Concern  . 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 2 dtrs , 1 son - homicide. 5 grandchildren.        Allergies as of 10/22/2018   No Known Allergies  Medication List       Accurate as of Oct 22, 2018  1:48 PM. Always use your most recent med list.        amLODipine 2.5 MG tablet Commonly known as:  Norvasc Take 1 tablet (2.5 mg total) by mouth daily.   apixaban 5 MG Tabs tablet Commonly known as:  Eliquis Take 1 tablet (5 mg total) by mouth 2 (two) times daily.   atorvastatin 40 MG tablet Commonly known as:  LIPITOR Take 1 tablet (40 mg total) by mouth daily.   baclofen 20 MG tablet Commonly known as:  LIORESAL Take 1.5 tablets (30 mg total) by mouth 3 (three) times daily for 30 days.   diltiazem 240 MG 24 hr capsule Commonly known as:  CARDIZEM CD Take 1 capsule (240 mg total) by mouth daily.   gabapentin 600 MG tablet Commonly known as:  Neurontin Take 1.5 tablets (900 mg total) by mouth 3 (three) times daily for 30 days.   meclizine 25 MG tablet Commonly known as:  ANTIVERT Take 1 tablet (25 mg total) by mouth 2 (two) times daily as  needed for dizziness.   metFORMIN 850 MG tablet Commonly known as:  GLUCOPHAGE Take 1 tablet (850 mg total) by mouth 2 (two) times daily with a meal.   metoprolol tartrate 50 MG tablet Commonly known as:  LOPRESSOR TAKE 1 TABLET BY MOUTH TWICE A DAY   multivitamin with minerals Tabs tablet Take 1 tablet by mouth daily. Centrum   PRESERVISION/LUTEIN PO Take by mouth.           Objective:   Physical Exam BP 140/89 (BP Location: Right Arm, Patient Position: Sitting, Cuff Size: Small)   Pulse 85   Temp 98.3 F (36.8 C) (Oral)   Ht 5\' 11"  (1.803 m)   Wt 168 lb 4 oz (76.3 kg)   SpO2 100%   BMI 23.47 kg/m  General:   Well developed, NAD, BMI noted.  HEENT:  Normocephalic . Face symmetric, atraumatic Lungs:  CTA B Normal respiratory effort, no intercostal retractions, no accessory muscle use. Heart: RRR,  no murmur.  no pretibial edema bilaterally  Abdomen:  Not distended, soft, non-tender. No rebound or rigidity..  No inguinal lymphadenopathy or hernia GU: Scrotal contents normal, testicles symmetric and not tender.  Penis without lesion or discharge. DRE: Slightly increased prostate gland, it is softer on the left side.  No nodules, not particularly tender Stools normal color. Skin: Not pale. Not jaundice Neurologic:  alert & oriented X3.  Speech normal, gait appropriate for age and unassisted Psych--  Cognition and judgment appear intact.  Cooperative with normal attention span and concentration.  Behavior appropriate. No anxious or depressed appearing.     Assessment        Assessment DM  ---->  DX 07-2015, A1c 8.0; started metformin Neuropathy, mild.  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  Hep C serologies negative, hep B surface Ab+, hep B core antibody  negative (consistent with vaccination)  PLAN UTI: The patient was recently diagnosed with a E. coli UTI, had Cipro, symptoms continue.  Today he is evaluated for ongoing LUTS, also left testicle discomfort.  Exam is fortunately benign, no obvious evidence of acute prostatitis or orchitis. He has a history of increased PSA which is reviewed today. Last PSA  01/26/2018 --- 5.6. Plan: Needs to see his urologist, states he will call UA, urine culture, PSA. Consider post void ultrasound HTN: Continue same meds, CMP and CBC RTC 2 months  Today, I spent more than 25   min with the patient: >50% of the time counseling regards persisting UTI symptoms, reviewing the chart, discussing the plan of care with the patient and his daughter.  I was concerned about prostatitis and probably orchitis but that does not seem to be the case on clinical grounds.

## 2018-10-22 NOTE — Patient Instructions (Addendum)
GO TO THE LAB : Get the blood work     GO TO THE FRONT DESK Schedule your next appointment   For a check up on 2 months

## 2018-10-23 LAB — COMPREHENSIVE METABOLIC PANEL
AG Ratio: 1.9 (calc) (ref 1.0–2.5)
ALT: 17 U/L (ref 9–46)
AST: 16 U/L (ref 10–35)
Albumin: 4.5 g/dL (ref 3.6–5.1)
Alkaline phosphatase (APISO): 74 U/L (ref 35–144)
BUN: 14 mg/dL (ref 7–25)
CO2: 28 mmol/L (ref 20–32)
Calcium: 9.5 mg/dL (ref 8.6–10.3)
Chloride: 104 mmol/L (ref 98–110)
Creat: 0.83 mg/dL (ref 0.70–1.18)
Globulin: 2.4 g/dL (calc) (ref 1.9–3.7)
Glucose, Bld: 92 mg/dL (ref 65–99)
Potassium: 4.3 mmol/L (ref 3.5–5.3)
Sodium: 140 mmol/L (ref 135–146)
Total Bilirubin: 0.3 mg/dL (ref 0.2–1.2)
Total Protein: 6.9 g/dL (ref 6.1–8.1)

## 2018-10-23 LAB — URINALYSIS, ROUTINE W REFLEX MICROSCOPIC
Bacteria, UA: NONE SEEN /HPF
Bilirubin Urine: NEGATIVE
Glucose, UA: NEGATIVE
Hgb urine dipstick: NEGATIVE
Hyaline Cast: NONE SEEN /LPF
Ketones, ur: NEGATIVE
Nitrite: NEGATIVE
Protein, ur: NEGATIVE
RBC / HPF: NONE SEEN /HPF (ref 0–2)
Specific Gravity, Urine: 1.011 (ref 1.001–1.03)
Squamous Epithelial / HPF: NONE SEEN /HPF (ref <=5)
pH: <=5 (ref 5.0–8.0)

## 2018-10-23 LAB — CBC WITH DIFFERENTIAL/PLATELET
Absolute Monocytes: 747 cells/uL (ref 200–950)
Basophils Absolute: 30 cells/uL (ref 0–200)
Basophils Relative: 0.4 %
Eosinophils Absolute: 133 cells/uL (ref 15–500)
Eosinophils Relative: 1.8 %
HCT: 41.4 % (ref 38.5–50.0)
Hemoglobin: 13.7 g/dL (ref 13.2–17.1)
Lymphs Abs: 2102 cells/uL (ref 850–3900)
MCH: 28.8 pg (ref 27.0–33.0)
MCHC: 33.1 g/dL (ref 32.0–36.0)
MCV: 87.2 fL (ref 80.0–100.0)
MPV: 10.7 fL (ref 7.5–12.5)
Monocytes Relative: 10.1 %
Neutro Abs: 4388 cells/uL (ref 1500–7800)
Neutrophils Relative %: 59.3 %
Platelets: 306 10*3/uL (ref 140–400)
RBC: 4.75 10*6/uL (ref 4.20–5.80)
RDW: 12.7 % (ref 11.0–15.0)
Total Lymphocyte: 28.4 %
WBC: 7.4 10*3/uL (ref 3.8–10.8)

## 2018-10-23 LAB — PSA: PSA: 5.8 ng/mL — ABNORMAL HIGH (ref <=4.0)

## 2018-10-23 LAB — URINE CULTURE
MICRO NUMBER:: 438755
Result:: NO GROWTH
SPECIMEN QUALITY:: ADEQUATE

## 2018-10-24 NOTE — Assessment & Plan Note (Addendum)
UTI: The patient was recently diagnosed with a E. coli UTI, had Cipro, symptoms continue.  Today he is evaluated for ongoing LUTS, also left testicle discomfort.  Exam is fortunately benign, no obvious evidence of acute prostatitis or orchitis. He has a history of increased PSA which is reviewed today. Last PSA 01/26/2018 --- 5.6. Plan: Needs to see his urologist, states he will call UA, urine culture, PSA. Consider post void ultrasound HTN: Continue same meds, CMP and CBC RTC 2 months

## 2018-10-25 ENCOUNTER — Other Ambulatory Visit: Payer: Self-pay | Admitting: Internal Medicine

## 2018-10-25 MED ORDER — SULFAMETHOXAZOLE-TRIMETHOPRIM 800-160 MG PO TABS: 1.0000 | ORAL_TABLET | Freq: Two times a day (BID) | ORAL | 0 refills | Status: DC

## 2018-10-26 ENCOUNTER — Ambulatory Visit: Payer: Medicare Other | Admitting: *Deleted

## 2018-10-26 ENCOUNTER — Ambulatory Visit: Payer: Medicare Other | Admitting: Internal Medicine

## 2018-10-29 ENCOUNTER — Other Ambulatory Visit: Payer: Self-pay | Admitting: Physical Medicine & Rehabilitation

## 2018-11-02 DIAGNOSIS — N451 Epididymitis: Secondary | ICD-10-CM | POA: Diagnosis not present

## 2018-11-03 ENCOUNTER — Emergency Department (HOSPITAL_COMMUNITY): Payer: Medicare Other

## 2018-11-03 ENCOUNTER — Emergency Department (HOSPITAL_COMMUNITY)
Admission: EM | Admit: 2018-11-03 | Discharge: 2018-11-03 | Disposition: A | Discharge status: Home / Self Care | Payer: Medicare Other | Attending: Emergency Medicine | Admitting: Emergency Medicine

## 2018-11-03 ENCOUNTER — Other Ambulatory Visit: Payer: Self-pay

## 2018-11-03 DIAGNOSIS — Z79899 Other long term (current) drug therapy: Secondary | ICD-10-CM | POA: Diagnosis not present

## 2018-11-03 DIAGNOSIS — R42 Dizziness and giddiness: Secondary | ICD-10-CM | POA: Insufficient documentation

## 2018-11-03 DIAGNOSIS — I10 Essential (primary) hypertension: Secondary | ICD-10-CM | POA: Diagnosis not present

## 2018-11-03 DIAGNOSIS — Z7984 Long term (current) use of oral hypoglycemic drugs: Secondary | ICD-10-CM | POA: Diagnosis not present

## 2018-11-03 DIAGNOSIS — R531 Weakness: Secondary | ICD-10-CM | POA: Diagnosis not present

## 2018-11-03 DIAGNOSIS — Z7901 Long term (current) use of anticoagulants: Secondary | ICD-10-CM | POA: Insufficient documentation

## 2018-11-03 DIAGNOSIS — E119 Type 2 diabetes mellitus without complications: Secondary | ICD-10-CM | POA: Insufficient documentation

## 2018-11-03 DIAGNOSIS — R5383 Other fatigue: Secondary | ICD-10-CM | POA: Diagnosis present

## 2018-11-03 LAB — RAPID URINE DRUG SCREEN, HOSP PERFORMED
Amphetamines: NOT DETECTED
Barbiturates: NOT DETECTED
Benzodiazepines: NOT DETECTED
Cocaine: NOT DETECTED
Opiates: NOT DETECTED
Tetrahydrocannabinol: NOT DETECTED

## 2018-11-03 LAB — COMPREHENSIVE METABOLIC PANEL
ALT: 16 U/L (ref 0–44)
AST: 17 U/L (ref 15–41)
Albumin: 3.9 g/dL (ref 3.5–5.0)
Alkaline Phosphatase: 71 U/L (ref 38–126)
Anion gap: 8 (ref 5–15)
BUN: 20 mg/dL (ref 8–23)
CO2: 22 mmol/L (ref 22–32)
Calcium: 9 mg/dL (ref 8.9–10.3)
Chloride: 109 mmol/L (ref 98–111)
Creatinine, Ser: 1.03 mg/dL (ref 0.61–1.24)
GFR calc Af Amer: >60 mL/min (ref >60)
GFR calc non Af Amer: >60 mL/min (ref >60)
Glucose, Bld: 137 mg/dL — ABNORMAL HIGH (ref 70–99)
Potassium: 4.7 mmol/L (ref 3.5–5.1)
Sodium: 139 mmol/L (ref 135–145)
Total Bilirubin: 0.3 mg/dL (ref 0.3–1.2)
Total Protein: 6.5 g/dL (ref 6.5–8.1)

## 2018-11-03 LAB — URINALYSIS, ROUTINE W REFLEX MICROSCOPIC
Bacteria, UA: NONE SEEN
Bilirubin Urine: NEGATIVE
Glucose, UA: NEGATIVE mg/dL
Hgb urine dipstick: NEGATIVE
Ketones, ur: NEGATIVE mg/dL
Nitrite: NEGATIVE
Protein, ur: NEGATIVE mg/dL
Specific Gravity, Urine: 1.014 (ref 1.005–1.030)
pH: 5 (ref 5.0–8.0)

## 2018-11-03 LAB — ETHANOL: Alcohol, Ethyl (B): <10 mg/dL (ref <10)

## 2018-11-03 LAB — CBC WITH DIFFERENTIAL/PLATELET
Abs Immature Granulocytes: 0.05 10*3/uL (ref 0.00–0.07)
Basophils Absolute: 0 10*3/uL (ref 0.0–0.1)
Basophils Relative: 0 %
Eosinophils Absolute: 0.1 10*3/uL (ref 0.0–0.5)
Eosinophils Relative: 1 %
HCT: 42.5 % (ref 39.0–52.0)
Hemoglobin: 13.4 g/dL (ref 13.0–17.0)
Immature Granulocytes: 1 %
Lymphocytes Relative: 15 %
Lymphs Abs: 1.2 10*3/uL (ref 0.7–4.0)
MCH: 29.2 pg (ref 26.0–34.0)
MCHC: 31.5 g/dL (ref 30.0–36.0)
MCV: 92.6 fL (ref 80.0–100.0)
Monocytes Absolute: 0.5 10*3/uL (ref 0.1–1.0)
Monocytes Relative: 7 %
Neutro Abs: 5.7 10*3/uL (ref 1.7–7.7)
Neutrophils Relative %: 76 %
Platelets: 247 10*3/uL (ref 150–400)
RBC: 4.59 MIL/uL (ref 4.22–5.81)
RDW: 14 % (ref 11.5–15.5)
WBC: 7.5 10*3/uL (ref 4.0–10.5)
nRBC: 0 % (ref 0.0–0.2)

## 2018-11-03 LAB — TROPONIN I: Troponin I: <0.03 ng/mL (ref <0.03)

## 2018-11-03 MED ORDER — SODIUM CHLORIDE 0.9 % IV BOLUS
1000.0000 mL | Freq: Once | INTRAVENOUS | Status: AC
  Administered 2018-11-03: 20:00:00 1000 mL via INTRAVENOUS

## 2018-11-03 NOTE — ED Notes (Signed)
Bed: SJ29 Expected date:  Expected time:  Means of arrival:  Comments: EMS dizziness, started taking new med

## 2018-11-03 NOTE — ED Provider Notes (Signed)
[facility] DEPT Provider Note   CSN: [id number] Arrival date & time: 11/03/18  1740    History   Chief Complaint No chief complaint on file.   HPI Marcus Beasley is a 73 y.o. male.     HPI   Marcus Beasley is a 73 y.o. male, with a history of BPH, DM, HTN, left-sided deficits due to stroke September 2019, presenting to the ED with a feeling of sudden fatigue and "wooziness" that occurred shortly prior to arrival.  Patient states he was sitting on a stool preparing dinner when he began to have the above sensation.  "It felt like I was just staring off into space, like I needed to sleep."  His family assisted him to a lower chair.  The sensation lasted for an estimated 5 to 10 minutes.  He felt normal by the time the fire department arrived.  This sensation has not recurred.  He has not felt this sensation before. EMS reports CBG of 105.  He states he has been on Cipro for the last week for possible UTI.  He has been experiencing pain at the tip of the penis for at least 6 weeks.  His urologist also started him on Flomax with his first dose this morning, with some increased urination throughout the day. He notes he is been drinking less water than normal today. Denies alcohol or illicit drug use.  Denies fever/chills, recent illness, cough, chest pain, shortness of breath, abdominal pain, N/V/D, dysuria, hematuria, back pain, or any other complaints.   Past Medical History:  Diagnosis Date   BPH (benign prostatic hyperplasia)    (-) Bx 2015   Diabetes mellitus without complication (HCC)    Elevated PSA    Prostate Bx in 06/2013 was benign   HTN (hypertension)    Hyperlipidemia    Macular degeneration, age related     Patient Active Problem List   Diagnosis Date Noted   Subacromial bursitis of left shoulder joint 09/10/2018   Spastic hemiplegia affecting nondominant side ([place]) 07/07/2018   Abnormality of gait 06/09/2018    Neuropathic pain    Labile blood pressure    PAF (paroxysmal atrial fibrillation) (HCC)    Hemiparesis affecting left side as late effect of stroke ([place])    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 08/06/2015   Dizziness and giddiness 04/08/5101   Umbilical hernia 58/52/7782   Elevated PSA, less than 10 ng/ml 04/19/2012   Annual physical exam 01/24/2011   Hyperlipidemia 01/03/2010   Essential hypertension 09/20/2007    Past Surgical History:  Procedure Laterality Date   ABCESS DRAINAGE     abdomen- 26 day hospitalization 1968   COLONOSCOPY  2016   HERNIA REPAIR  summer '11   umbilical, dr Ninfa Linden    POLYPECTOMY     PROSTATE BIOPSY  06-2013 , 07-2017   (-), (-)        Home Medications    Prior to Admission medications   Medication Sig Start Date End Date Taking? Authorizing Provider  apixaban (ELIQUIS) 5 MG TABS tablet Take 1 tablet (5 mg total) by mouth 2 (two) times daily. 07/27/18  Yes Martinique, Peter M, MD  atorvastatin (LIPITOR) 40 MG tablet  Take 1 tablet (40 mg total) by mouth daily. 07/27/18 07/22/19 Yes Martinique, Peter M, MD  baclofen (LIORESAL) 20 MG tablet Take 1.5 tablets (30 mg total) by mouth 3 (three) times daily for 30 days. Patient taking differently: Take 20 mg by mouth 3 (three) times daily.  10/06/18 11/05/18 Yes Jamse Arn, MD  diltiazem (CARDIZEM CD) 240 MG 24 hr capsule Take 1 capsule (240 mg total) by mouth daily. 10/11/18  Yes Almyra Deforest, PA  gabapentin (NEURONTIN) 600 MG tablet Take 1.5 tablets (900 mg total) by mouth 3 (three) times daily for 30 days. Patient taking differently: Take 600 mg by mouth 3 (three) times daily.  10/06/18 11/05/18 Yes Jamse Arn, MD  meclizine (ANTIVERT) 25 MG tablet Take 1 tablet (25 mg total) by mouth 2 (two) times daily as needed for dizziness. 06/14/18  Yes Colon Branch, MD  metFORMIN (GLUCOPHAGE) 850 MG tablet Take 1 tablet (850 mg total) by mouth 2 (two) times daily with a meal. 04/15/18  Yes Angiulli, Lavon Paganini, PA-C  metoprolol tartrate (LOPRESSOR) 50 MG tablet TAKE 1 TABLET BY MOUTH TWICE A DAY Patient taking differently: Take 50 mg by mouth 2 (two) times daily.  05/25/18  Yes Almyra Deforest, PA  Multiple Vitamin (MULTIVITAMIN WITH MINERALS) TABS tablet Take 1 tablet by mouth daily.    Yes [provider]  Multiple Vitamins-Minerals (PRESERVISION/LUTEIN PO) Take 1 tablet by mouth daily.    Yes [provider]  sulfamethoxazole-trimethoprim (BACTRIM DS) 800-160 MG tablet Take 1 tablet by mouth 2 (two) times daily. 10/25/18  Yes Paz, Alda Berthold, MD  tamsulosin (FLOMAX) 0.4 MG CAPS capsule Take 0.4 mg by mouth daily.   Yes [provider]  amLODipine (NORVASC) 2.5 MG tablet Take 1 tablet (2.5 mg total) by mouth daily. Patient not taking: Reported on 11/03/2018 05/17/18   Venancio Poisson, NP    Family History Family History  Problem Relation Age of Onset   Leukemia Mother    Heart attack Father        MI age 37   Heart disease Sister        age 55   Cancer - Other Sister        type   Heart disease Brother        age 76   Kidney disease Brother        HD   Diabetes Maternal Grandmother    Prostate cancer Neg Hx    Colon cancer Neg Hx    Esophageal cancer Neg Hx    Rectal cancer Neg Hx    Stomach cancer Neg Hx     Social History Social History   Tobacco Use   Smoking status: Never Smoker   Smokeless tobacco: Never Used  Substance Use Topics   Alcohol use: Yes    Comment: occ. beer   Drug use: No     Allergies   Patient has no known allergies.   Review of Systems Review of Systems  Constitutional: Negative for chills, diaphoresis and fever.  Eyes:  Negative for visual disturbance.  Respiratory: Negative for cough and shortness of breath.   Cardiovascular: Negative for chest pain and leg swelling.  Gastrointestinal: Negative for abdominal pain, diarrhea, nausea and vomiting.  Genitourinary: Negative for dysuria, hematuria and testicular pain.  Musculoskeletal: Negative for back pain and neck pain.  Neurological: Negative for seizures, syncope, weakness, numbness and headaches.       "Wooziness"  All other systems reviewed and are  negative.    Physical Exam Updated Vital Signs BP 111/79    Pulse 70    Temp 98.4 F (36.9 C)    Resp (!) 21    Ht 5\' 11"  (1.803 m)    Wt 72.6 kg    SpO2 99%    BMI 22.32 kg/m   Physical Exam Vitals signs and nursing note reviewed.  Constitutional:      General: He is not in acute distress.    Appearance: He is well-developed. He is not diaphoretic.  HENT:     Head: Normocephalic and atraumatic.     Mouth/Throat:     Mouth: Mucous membranes are moist.     Pharynx: Oropharynx is clear.  Eyes:     Extraocular Movements: Extraocular movements intact.     Conjunctiva/sclera: Conjunctivae normal.     Pupils: Pupils are equal, round, and reactive to light.  Neck:     Musculoskeletal: Neck supple.  Cardiovascular:     Rate and Rhythm: Normal rate and regular rhythm.     Pulses: Normal pulses.          Radial pulses are 2+ on the right side and 2+ on the left side.       Posterior tibial pulses are 2+ on the right side and 2+ on the left side.     Heart sounds: Normal heart sounds.     Comments: Tactile temperature in the extremities appropriate and equal bilaterally. Pulmonary:     Effort: Pulmonary effort is normal. No respiratory distress.     Breath sounds: Normal breath sounds.  Abdominal:     Palpations: Abdomen is soft.     Tenderness: There is no abdominal tenderness. There is no guarding.  Musculoskeletal:     Right lower leg: No edema.     Left lower leg: No edema.  Lymphadenopathy:       Cervical: No cervical adenopathy.  Skin:    General: Skin is warm and dry.  Neurological:     Mental Status: He is alert and oriented to person, place, and time.     Comments: Sensation grossly intact to light touch in the extremities. No noted speech deficits. No aphasia. Patient handles oral secretions without difficulty. No noted swallowing defects.  Grip strengths are pretty equal bilaterally, perhaps a little weaker on the left, per patient's baseline. Strength 5/5 in the right upper extremity, 4/5 in the left upper extremity.  States his strength in the left upper extremity is to his baseline from his stroke. Strength 5/5 in the lower extremities bilaterally. Negative Romberg.  Upon transitioning from a sitting to a standing position, patient states he began to again feel "woozy."  This sensation was mild and resolved after a few seconds. Ambulated with a slow gait, but did not require assistance. Coordination intact including heel to shin and finger to nose.  Cranial nerves III-XII grossly intact.  No noted visual field deficit. No facial droop.   Psychiatric:        Mood and Affect: Mood and affect normal.        Speech: Speech normal.        Behavior: Behavior normal.      ED Treatments / Results  Labs (all labs ordered are listed, but only abnormal results are displayed) Labs Reviewed  URINALYSIS, ROUTINE W REFLEX MICROSCOPIC - Abnormal; Notable for the following components:      Result Value   APPearance CLOUDY (*)    Leukocytes,Ua TRACE (*)  All other components within normal limits  COMPREHENSIVE METABOLIC PANEL - Abnormal; Notable for the following components:   Glucose, Bld 137 (*)    All other components within normal limits  CBC WITH DIFFERENTIAL/PLATELET  RAPID URINE DRUG SCREEN, HOSP PERFORMED  TROPONIN I  ETHANOL    EKG EKG Interpretation  Date/Time:  Wednesday Nov 03 2018 18:16:02 EDT Ventricular Rate:  78 PR Interval:    QRS  Duration: 97 QT Interval:  370 QTC Calculation: 422 R Axis:   50 Text Interpretation:  Sinus rhythm Abnormal R-wave progression, early transition Confirmed by Gerlene Fee [phone]) on 11/03/2018 8:13:27 PM   Radiology Dg Chest 2 View  Result Date: 11/03/2018 CLINICAL DATA:  Dizziness and weakness beginning today. EXAM: CHEST - 2 VIEW COMPARISON:  01/03/2010 FINDINGS: Heart size is normal. Tortuous aorta. The pulmonary vascularity is normal. The lungs are clear. No effusions. Ordinary mild degenerative changes affect the spine. IMPRESSION: No active cardiopulmonary disease. Electronically Signed   By: Nelson Chimes M.D.   On: 11/03/2018 19:12   Ct Head Wo Contrast  Result Date: 11/03/2018 CLINICAL DATA:  Dizziness and weakness. EXAM: CT HEAD WITHOUT CONTRAST TECHNIQUE: Contiguous axial images were obtained from the base of the skull through the vertex without intravenous contrast. COMPARISON:  CT head dated May 26, 2018. FINDINGS: Brain: No evidence of acute infarction, hemorrhage, hydrocephalus, extra-axial collection or mass lesion/mass effect. Old lacunar infarct in the right thalamus again noted. Chronic appearing white matter lacunar infarct in the right parietal lobe near the posterior aspect of the right lateral ventricle, new since December 2019. Vascular: No hyperdense vessel or unexpected calcification. Skull: Normal. Negative for fracture or focal lesion. Sinuses/Orbits: No acute finding. Other: None. IMPRESSION: 1.  No acute intracranial abnormality. 2. Old lacunar infarcts as above. Electronically Signed   By: Titus Dubin M.D.   On: 11/03/2018 18:58    Procedures Procedures (including critical care time)  Medications Ordered in ED Medications  sodium chloride 0.9 % bolus 1,000 mL (0 mLs Intravenous Stopped 11/03/18 2052)     Initial Impression / Assessment and Plan / ED Course  I have reviewed the triage vital signs and the nursing notes.  Pertinent labs & imaging  results that were available during my care of the patient were reviewed by me and considered in my medical decision making (see chart for details).        Patient presents after an episode of wooziness.  He endorses decreased hydration today as well as some increased urination after starting Flomax.  Flomax does have a possible side effect of dizziness; his decreased hydration could also be a factor.  Lab results reassuring.  No acute abnormality on CT or CXR.  Patient hydrated with IV fluids and feeling better.  Ambulated without assistance or onset of symptoms.   Findings and plan of care discussed with Gerlene Fee, MD. Dr. Sedonia Small personally evaluated and examined this patient.  Vitals:   11/03/18 1750 11/03/18 1923 11/03/18 1924 11/03/18 2050  BP: 117/78 111/79  131/88  Pulse: 75 70  71  Resp: 16 (!) 21  14  Temp:   98.4 F (36.9 C)   TempSrc:      SpO2: 100% 99%  100%  Weight:      Height:           Marcus Beasley was evaluated in Emergency Department on 11/03/2018 for the symptoms described in the history of present illness. He was evaluated in the context of the  global COVID-19 pandemic, which necessitated consideration that the patient might be at risk for infection with the SARS-CoV-2 virus that causes COVID-19. Institutional protocols and algorithms that pertain to the evaluation of patients at risk for COVID-19 are in a state of rapid change based on information released by regulatory bodies including the CDC and federal and state organizations. These policies and algorithms were followed during the patient's care in the ED.  Final Clinical Impressions(s) / ED Diagnoses   Final diagnoses:  Lightheadedness    ED Discharge Orders    None       Layla Maw 11/03/18 2109    Maudie Flakes, MD 11/08/18 1158

## 2018-11-03 NOTE — ED Notes (Signed)
Pt was able to ambulate freely with no issues. No reports of dizziness or syncope from the pt, was also able eat without any reports of N/V.

## 2018-11-03 NOTE — ED Triage Notes (Signed)
Per EMS, Pt was at home working in the kitchen when he began feeling dizzy and weak. Pt then sat down and put his head down, family called 911. Negative on stroke screen, left sided weakness from previous stroke in 2019. Recently began cipro and famotidine for a UTI. No LOC, a&o 4x.

## 2018-11-03 NOTE — Discharge Instructions (Signed)
°  Your symptoms may have been caused by decreased fluid intake or as a mild side effect of the Flomax.  Keep monitoring this situation and should it persist, please speak with the urologist and your primary care provider about it.  Dehydration typically causes its own symptoms including lightheadedness, nausea, headaches, fatigue increased thirst, and generally feeling unwell. Drink plenty of fluids and get plenty of rest. You should be drinking at least half a liter of water every hour or two to stay hydrated. Electrolyte drinks (ex. Gatorade, Powerade, Pedialyte) are also encouraged. You should be drinking enough fluids to make your urine light yellow, almost clear. If this is not the case, you are not drinking enough water.

## 2018-11-03 NOTE — ED Triage Notes (Signed)
Orthostatic BP was neg.

## 2018-11-10 ENCOUNTER — Ambulatory Visit (INDEPENDENT_AMBULATORY_CARE_PROVIDER_SITE_OTHER): Payer: Medicare Other | Admitting: Internal Medicine

## 2018-11-10 DIAGNOSIS — R42 Dizziness and giddiness: Secondary | ICD-10-CM | POA: Diagnosis not present

## 2018-11-10 DIAGNOSIS — N390 Urinary tract infection, site not specified: Secondary | ICD-10-CM

## 2018-11-10 NOTE — Progress Notes (Signed)
Subjective:    Patient ID: Marcus Beasley, male    DOB: 01-Apr-1946, 73 y.o.   MRN: [id number]  DOS:  11/10/2018 Type of visit - description: Virtual Visit via Video Note  I connected with@ on 11/11/18 at 10:00 AM EDT by a video enabled telemedicine application and verified that I am speaking with the correct person using two identifiers.   THIS ENCOUNTER IS A VIRTUAL VISIT DUE TO COVID-19 - PATIENT WAS NOT SEEN IN THE OFFICE. PATIENT HAS CONSENTED TO VIRTUAL VISIT / TELEMEDICINE VISIT   Location of patient: home  Location of provider: office  I discussed the limitations of evaluation and management by telemedicine and the availability of in person appointments. The patient expressed understanding and agreed to proceed.  History of Present Illness: Follow-up Sees the last office visit, went to the ER with dizziness, work-up reviewed, negative, no further symptoms L UTS: Since the last office visit he went to urology, no records available at the time of the visit.  He was told he was stable and prescribed Flomax. Currently doing well.    Review of Systems Denies fever chills Mild dysuria. No gross hematuria, urinary difficulty.  Admits to some urinary frequency  Past Medical History:  Diagnosis Date  . BPH (benign prostatic hyperplasia)    (-) Bx 2015  . Diabetes mellitus without complication ([place])   . Elevated PSA    Prostate Bx in 06/2013 was benign  . HTN (hypertension)   . Hyperlipidemia   . Macular degeneration, age related     Past Surgical History:  Procedure Laterality Date  . ABCESS DRAINAGE     abdomen- 26 day hospitalization 1968  . COLONOSCOPY  2016  . HERNIA REPAIR  summer '11   umbilical, dr Ninfa Linden   . POLYPECTOMY    . PROSTATE BIOPSY  06-2013 , 07-2017   (-), (-)    Social History   Socioeconomic History  . Marital status: Married    Spouse name: Not on file  . Number of children: 3  . Years of education: 27  . Highest education level: Not on  file  Occupational History  . 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 Financial resource strain: Not on file  . Food insecurity:    Worry: Not on file    Inability: Not on file  . Transportation needs:    Medical: Not on file    Non-medical: Not on file  Tobacco Use  . Smoking status: Never Smoker  . Smokeless tobacco: Never Used  Substance and Sexual Activity  . Alcohol use: Yes    Comment: occ. beer  . Drug use: No  . Sexual activity: Yes    Partners: Female  Lifestyle  . Physical activity:    Days per week: Not on file    Minutes per session: Not on file  . Stress: Not on file  Relationships  . Social connections:    Talks on phone: Not on file    Gets together: Not on file    Attends religious service: Not on file    Active member of club or organization: Not on file    Attends meetings of clubs or organizations: Not on file    Relationship status: Not on file  . Intimate partner violence:    Fear of current or ex partner: Not on file    Emotionally abused: Not on file    Physically abused: Not on  file    Forced sexual activity: Not on file  Other Topics Concern  . 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 2 dtrs , 1 son - homicide. 5 grandchildren.        Allergies as of 11/10/2018   No Known Allergies     Medication List       Accurate as of Nov 10, 2018 11:59 PM. If you have any questions, ask your nurse or doctor.        STOP taking these medications   sulfamethoxazole-trimethoprim 800-160 MG tablet Commonly known as:  Bactrim DS     TAKE these medications   amLODipine 2.5 MG tablet Commonly known as:  Norvasc Take 1 tablet (2.5 mg total) by mouth daily.   apixaban 5 MG Tabs tablet Commonly known as:  Eliquis Take 1 tablet (5 mg total) by mouth 2 (two) times daily.   atorvastatin 40 MG tablet Commonly known as:  LIPITOR Take 1 tablet (40 mg  total) by mouth daily.   diltiazem 240 MG 24 hr capsule Commonly known as:  CARDIZEM CD Take 1 capsule (240 mg total) by mouth daily.   gabapentin 600 MG tablet Commonly known as:  Neurontin Take 1.5 tablets (900 mg total) by mouth 3 (three) times daily for 30 days. What changed:  how much to take   meclizine 25 MG tablet Commonly known as:  ANTIVERT Take 1 tablet (25 mg total) by mouth 2 (two) times daily as needed for dizziness.   metFORMIN 850 MG tablet Commonly known as:  GLUCOPHAGE Take 1 tablet (850 mg total) by mouth 2 (two) times daily with a meal.   metoprolol tartrate 50 MG tablet Commonly known as:  LOPRESSOR TAKE 1 TABLET BY MOUTH TWICE A DAY   multivitamin with minerals Tabs tablet Take 1 tablet by mouth daily.   PRESERVISION/LUTEIN PO Take 1 tablet by mouth daily.   tamsulosin 0.4 MG Caps capsule Commonly known as:  FLOMAX Take 0.4 mg by mouth daily.           Objective:   Physical Exam There were no vitals taken for this visit. This is a virtual video visit, patient is alert oriented x3, speech is clear and fluent.  No apparent distress.    Assessment      Assessment DM  ---->  DX 07-2015, A1c 8.0; started metformin Neuropathy, mild.  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  Hep C serologies negative, hep B surface Ab+, hep B core antibody negative (consistent with vaccination)  PLAN UTI: Since the last visit, PSAs were 5.8, he had mild pyuria but the urine culture was negative, I elected to give him Bactrim for 2 weeks.  Subsequently he saw urology, patient reports that the DRE and prostate exam were stable.  He is improved, has minimal persistent dysuria.  Recommend to talk with urology if that becomes a problem. Dizziness: Recently went to the ER,  work-up reviewed, negative, was told probably dehydration, drinking plenty of fluids, no further problems. Recent labs reviewed, not due for blood work. Medications reviewed, good compliance. RTC 3 to 4 months CPX, will schedule     I discussed the assessment and treatment plan with the patient. The patient was provided an opportunity to ask questions and  all were answered. The patient agreed with the plan and demonstrated an understanding of the instructions.   The patient was advised to call back or seek an in-person evaluation if the symptoms worsen or if the condition fails to improve as anticipated.

## 2018-11-11 ENCOUNTER — Encounter: Payer: Self-pay | Admitting: Physical Medicine & Rehabilitation

## 2018-11-11 ENCOUNTER — Encounter: Payer: Medicare Other | Attending: Physical Medicine & Rehabilitation | Admitting: Physical Medicine & Rehabilitation

## 2018-11-11 ENCOUNTER — Other Ambulatory Visit: Payer: Self-pay

## 2018-11-11 VITALS — BP 134/80 | Ht 71.0 in | Wt 165.0 lb

## 2018-11-11 DIAGNOSIS — I10 Essential (primary) hypertension: Secondary | ICD-10-CM | POA: Insufficient documentation

## 2018-11-11 DIAGNOSIS — I69354 Hemiplegia and hemiparesis following cerebral infarction affecting left non-dominant side: Secondary | ICD-10-CM

## 2018-11-11 DIAGNOSIS — E119 Type 2 diabetes mellitus without complications: Secondary | ICD-10-CM | POA: Insufficient documentation

## 2018-11-11 DIAGNOSIS — G8929 Other chronic pain: Secondary | ICD-10-CM

## 2018-11-11 DIAGNOSIS — R269 Unspecified abnormalities of gait and mobility: Secondary | ICD-10-CM

## 2018-11-11 DIAGNOSIS — M792 Neuralgia and neuritis, unspecified: Secondary | ICD-10-CM

## 2018-11-11 DIAGNOSIS — G8110 Spastic hemiplegia affecting unspecified side: Secondary | ICD-10-CM | POA: Diagnosis not present

## 2018-11-11 MED ORDER — BACLOFEN 20 MG PO TABS: 30.0000 mg | ORAL_TABLET | Freq: Three times a day (TID) | ORAL | 1 refills | Status: AC

## 2018-11-11 NOTE — Assessment & Plan Note (Signed)
UTI: Since the last visit, PSAs were 5.8, he had mild pyuria but the urine culture was negative, I elected to give him Bactrim for 2 weeks.  Subsequently he saw urology, patient reports that the DRE and prostate exam were stable.  He is improved, has minimal persistent dysuria.  Recommend to talk with urology if that becomes a problem. Dizziness: Recently went to the ER, work-up reviewed, negative, was told probably dehydration, drinking plenty of fluids, no further problems. Recent labs reviewed, not due for blood work. Medications reviewed, good compliance. RTC 3 to 4 months CPX, will schedule

## 2018-11-11 NOTE — Progress Notes (Signed)
Subjective:    Patient ID: Marcus Beasley, male    DOB: Aug 04, 1945, 73 y.o.   MRN: [id number]  TELEHEALTH NOTE  Due to national recommendations of social distancing due to COVID 19, an audio/video telehealth visit is felt to be most appropriate for this patient at this time.  See Chart message from today for the patient's consent to telehealth from [place].     I verified that I am speaking with the correct person using two identifiers.  Location of patient: Home Location of provider: Office Method of communication: Telephone Names of participants : [name] scheduling, Jasmine December obtaining consent and vitals if available Established patient Time spent on call: 16 minutes   HPI  Right-handed male with history of diabetes mellitus, hypertension presents for follow up for right thalamic hemorrhage.   Last clinic visit 10/06/2018.  Since that time patient went to the ED for lightheadedness.  Work-up unremarkable.  Notes reviewed.  Since last visit, patient states symptoms have resolved and were due to dehydration. Therapies are still on hold. Patient is poor historian.  He states he states he stopped Baclofen at increased dose because he ran out, but does not notice much difference. He is taking 600mg  Gabapentin, could not tolerate higher dose. Denies falls.  Continues to arm "heaviness".   Pain Inventory Average Pain 0 Pain Right Now 0 My pain is constant, tingling and heaviness left side  In the last 24 hours, has pain interfered with the following? General activity 0 Relation with others 0 Enjoyment of life 0 What TIME of day is your pain at its worst? na Sleep (in general) Fair  Pain is worse with: no pain Pain improves with: medication and no pain Relief from Meds: no pain  Mobility walk with assistance use a cane ability to climb steps?  yes Do you have any goals in this area?  yes  Function retired I need assistance  with the following:  meal prep, household duties and shopping  Neuro/Psych bladder control problems weakness numbness tingling trouble walking spasms  Prior Studies Any changes since last visit?  no  Physicians involved in your care Any changes since last visit?  yes Urologist-Dr. Chrystine Oiler   Family History  Problem Relation Age of Onset  . Leukemia Mother   . Heart attack Father        MI age 60  . Heart disease Sister        age 67  . Cancer - Other Sister        type  . Heart disease Brother        age 75  . Kidney disease Brother        HD  . Diabetes Maternal Grandmother   . Prostate cancer Neg Hx   . Colon cancer Neg Hx   . Esophageal cancer Neg Hx   . Rectal cancer Neg Hx   . Stomach cancer Neg Hx    Social History   Socioeconomic History  . Marital status: Married    Spouse name: Not on file  . Number of children: 3  . Years of education: 51  . Highest education level: Not on file  Occupational History  . 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 Financial resource strain: Not on file  . Food insecurity:    Worry: Not on file    Inability: Not on file  . Transportation needs:  Medical: Not on file    Non-medical: Not on file  Tobacco Use  . Smoking status: Never Smoker  . Smokeless tobacco: Never Used  Substance and Sexual Activity  . Alcohol use: Yes    Comment: occ. beer  . Drug use: No  . Sexual activity: Yes    Partners: Female  Lifestyle  . Physical activity:    Days per week: Not on file    Minutes per session: Not on file  . Stress: Not on file  Relationships  . Social connections:    Talks on phone: Not on file    Gets together: Not on file    Attends religious service: Not on file    Active member of club or organization: Not on file    Attends meetings of clubs or organizations: Not on file    Relationship status: Not on file  Other Topics Concern  . 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 2 dtrs , 1 son - homicide. 5 grandchildren.     Past Surgical History:  Procedure Laterality Date  . ABCESS DRAINAGE     abdomen- 26 day hospitalization 1968  . COLONOSCOPY  2016  . HERNIA REPAIR  summer '11   umbilical, dr Ninfa Linden   . POLYPECTOMY    . PROSTATE BIOPSY  06-2013 , 07-2017   (-), (-)   Past Medical History:  Diagnosis Date  . BPH (benign prostatic hyperplasia)    (-) Bx 2015  . Diabetes mellitus without complication ([place])   . Elevated PSA    Prostate Bx in 06/2013 was benign  . HTN (hypertension)   . Hyperlipidemia   . Macular degeneration, age related    BP 134/80 Comment: pt reported virtual visit  Ht 5\' 11"  (1.803 m) Comment: pt reported virtual visit  Wt 165 lb (74.8 kg) Comment: pt reported virtual visit  BMI 23.01 kg/m   Opioid Risk Score:   Fall Risk Score:  `1  Depression screen PHQ 2/9  Depression screen [facility] 2/9 11/11/2018 10/06/2018 04/28/2018 08/25/2017 04/14/2016 12/14/2015 08/06/2015  Decreased Interest 0 0 0 0 0 0 0  Down, Depressed, Hopeless 0 0 0 0 0 0 0  PHQ - 2 Score 0 0 0 0 0 0 0     Review of Systems  Constitutional: Positive for appetite change and unexpected weight change.       Appetite poor and feels like he is still losing weight. (has not weighed since last visit)  HENT: Negative.   Eyes: Negative.   Respiratory: Negative.   Cardiovascular: Negative.   Gastrointestinal: Negative.   Endocrine: Negative.   Genitourinary: Positive for difficulty urinating.       Hesitancy  Musculoskeletal: Positive for gait problem.  Skin: Negative.   Allergic/Immunologic: Negative.   Neurological: Positive for weakness and numbness.       Tingling  Hematological: Bruises/bleeds easily.       On eliquis  Psychiatric/Behavioral: Negative.   All other systems reviewed and are negative.     Objective:   Physical Exam Gen: NAD. Pulm: Effort normal. Neuro: Alert and  oriented    Assessment & Plan:  Right-handed male with history of diabetes mellitus, hypertension presents for follow up for right thalamic hemorrhage, now with some spasticity.   1. Left-sided weakness secondary to right thalamic hemorrhage secondary to hypertensive crisis now with spasticity  Resume therapies when possible  Cont follow up with Neurology  Will  schedule for Botulinum toxin injection in future:   Left Biceps: 75 units   Left upper traps: 25 units   2. Pain Management:   ?No benefit with Baclofen, but patient would like to try it again  Tylenol as needed  Will consider Elavil 10 qhs, pt does not want at present  D/ced Tramadol  Cont ROM  Cont Gabapentin 600 mg TID, unable to tolerate 900mg    3. Gait abnormality  Resume therapies when possible  Cont walker/wheelchair for safety  4. Post stroke shoulder pain  Good benefit with pain in shoulder

## 2018-11-13 DIAGNOSIS — M792 Neuralgia and neuritis, unspecified: Secondary | ICD-10-CM | POA: Diagnosis not present

## 2018-11-13 DIAGNOSIS — I610 Nontraumatic intracerebral hemorrhage in hemisphere, subcortical: Secondary | ICD-10-CM | POA: Diagnosis not present

## 2018-11-13 DIAGNOSIS — I69354 Hemiplegia and hemiparesis following cerebral infarction affecting left non-dominant side: Secondary | ICD-10-CM | POA: Diagnosis not present

## 2018-11-17 ENCOUNTER — Other Ambulatory Visit: Payer: Self-pay | Admitting: Internal Medicine

## 2018-11-17 ENCOUNTER — Other Ambulatory Visit: Payer: Self-pay

## 2018-11-17 MED ORDER — GLUCOSE BLOOD VI STRP: ORAL_STRIP | 12 refills | Status: DC

## 2018-11-17 MED ORDER — METOPROLOL TARTRATE 50 MG PO TABS: 50.0000 mg | ORAL_TABLET | Freq: Two times a day (BID) | ORAL | 1 refills | Status: DC

## 2018-11-17 NOTE — Telephone Encounter (Signed)
Refills sent

## 2018-11-17 NOTE — Telephone Encounter (Signed)
Copied from [place] [id number]. Topic: Quick Communication - Rx Refill/Question >> Nov 17, 2018  9:49 AM Robina Ade, Helene Kelp D wrote: Medication: one touch bario test strips 100 counts   Has the patient contacted their pharmacy? yes (Agent: If no, request that the patient contact the pharmacy for the refill.) (Agent: If yes, when and what did the pharmacy advise?)  Preferred Pharmacy (with phone number or street name):[id number] [place], [place]  Agent: Please be advised that RX refills may take up to 3 business days. We ask that you follow-up with your pharmacy.

## 2018-11-22 DIAGNOSIS — H40013 Open angle with borderline findings, low risk, bilateral: Secondary | ICD-10-CM | POA: Diagnosis not present

## 2018-11-22 DIAGNOSIS — H40033 Anatomical narrow angle, bilateral: Secondary | ICD-10-CM | POA: Diagnosis not present

## 2018-11-22 DIAGNOSIS — H2513 Age-related nuclear cataract, bilateral: Secondary | ICD-10-CM | POA: Diagnosis not present

## 2018-11-22 LAB — HM DIABETES EYE EXAM

## 2018-12-09 ENCOUNTER — Encounter: Payer: Self-pay | Admitting: Physical Medicine & Rehabilitation

## 2018-12-09 ENCOUNTER — Encounter: Payer: Medicare Other | Attending: Physical Medicine & Rehabilitation | Admitting: Physical Medicine & Rehabilitation

## 2018-12-09 ENCOUNTER — Other Ambulatory Visit: Payer: Self-pay

## 2018-12-09 VITALS — BP 136/89 | HR 67 | Temp 98.9°F | Ht 71.0 in | Wt 175.8 lb

## 2018-12-09 DIAGNOSIS — E119 Type 2 diabetes mellitus without complications: Secondary | ICD-10-CM | POA: Insufficient documentation

## 2018-12-09 DIAGNOSIS — G8110 Spastic hemiplegia affecting unspecified side: Secondary | ICD-10-CM | POA: Diagnosis not present

## 2018-12-09 DIAGNOSIS — I69354 Hemiplegia and hemiparesis following cerebral infarction affecting left non-dominant side: Secondary | ICD-10-CM | POA: Diagnosis not present

## 2018-12-09 DIAGNOSIS — I610 Nontraumatic intracerebral hemorrhage in hemisphere, subcortical: Secondary | ICD-10-CM

## 2018-12-09 DIAGNOSIS — I10 Essential (primary) hypertension: Secondary | ICD-10-CM | POA: Insufficient documentation

## 2018-12-09 DIAGNOSIS — R269 Unspecified abnormalities of gait and mobility: Secondary | ICD-10-CM

## 2018-12-09 DIAGNOSIS — M7552 Bursitis of left shoulder: Secondary | ICD-10-CM

## 2018-12-09 DIAGNOSIS — M792 Neuralgia and neuritis, unspecified: Secondary | ICD-10-CM | POA: Diagnosis not present

## 2018-12-09 NOTE — Progress Notes (Signed)
Subjective:    Patient ID: Marcus Beasley, male    DOB: 12/15/1945, 73 y.o.   MRN: [id number]  HPI:  Right-handed male with history of diabetes mellitus, hypertension presents for follow up for right thalamic hemorrhage.   Last clinic visit 11/11/2018.  Since that time, he has not restarted therapies.  He is taking Baclofen without much benefit and causes sedation. No pain in shoulders.  Denies falls.   Pain Inventory Average Pain 0 Pain Right Now 0 My pain is constant, tingling and heaviness left side  In the last 24 hours, has pain interfered with the following? General activity 0 Relation with others 0 Enjoyment of life 0 What TIME of day is your pain at its worst? na Sleep (in general) Fair  Pain is worse with: no pain Pain improves with: medication and no pain Relief from Meds: no pain  Mobility walk with assistance use a cane ability to climb steps?  yes Do you have any goals in this area?  yes  Function retired I need assistance with the following:  meal prep, household duties and shopping  Neuro/Psych bladder control problems weakness numbness tingling trouble walking  Prior Studies Any changes since last visit?  no  Physicians involved in your care Any changes since last visit?  yes Urologist-Dr. Chrystine Oiler   Family History  Problem Relation Age of Onset  . Leukemia Mother   . Heart attack Father        MI age 44  . Heart disease Sister        age 63  . Cancer - Other Sister        type  . Heart disease Brother        age 44  . Kidney disease Brother        HD  . Diabetes Maternal Grandmother   . Prostate cancer Neg Hx   . Colon cancer Neg Hx   . Esophageal cancer Neg Hx   . Rectal cancer Neg Hx   . Stomach cancer Neg Hx    Social History   Socioeconomic History  . Marital status: Married    Spouse name: Not on file  . Number of children: 3  . Years of education: 50  . Highest education level: Not on file  Occupational  History  . 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 Financial resource strain: Not on file  . Food insecurity    Worry: Not on file    Inability: Not on file  . Transportation needs    Medical: Not on file    Non-medical: Not on file  Tobacco Use  . Smoking status: Never Smoker  . Smokeless tobacco: Never Used  Substance and Sexual Activity  . Alcohol use: Yes    Comment: occ. beer  . Drug use: No  . Sexual activity: Yes    Partners: Female  Lifestyle  . Physical activity    Days per week: Not on file    Minutes per session: Not on file  . Stress: Not on file  Relationships  . Social Herbalist on phone: Not on file    Gets together: Not on file    Attends religious service: Not on file    Active member of club or organization: Not on file    Attends meetings of clubs or organizations: Not on file    Relationship status: Not on file  Other  Topics Concern  . 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 2 dtrs , 1 son - homicide. 5 grandchildren.     Past Surgical History:  Procedure Laterality Date  . ABCESS DRAINAGE     abdomen- 26 day hospitalization 1968  . COLONOSCOPY  2016  . HERNIA REPAIR  summer '11   umbilical, dr Ninfa Linden   . POLYPECTOMY    . PROSTATE BIOPSY  06-2013 , 07-2017   (-), (-)   Past Medical History:  Diagnosis Date  . BPH (benign prostatic hyperplasia)    (-) Bx 2015  . Diabetes mellitus without complication ([place])   . Elevated PSA    Prostate Bx in 06/2013 was benign  . HTN (hypertension)   . Hyperlipidemia   . Macular degeneration, age related    BP 136/89   Pulse 67   Temp 98.9 F (37.2 C)   Ht 5\' 11"  (1.803 m)   Wt 175 lb 12.8 oz (79.7 kg)   SpO2 96%   BMI 24.52 kg/m   Opioid Risk Score:   Fall Risk Score:  `1  Depression screen PHQ 2/9  Depression screen [facility] 2/9 11/11/2018 10/06/2018 04/28/2018 08/25/2017 04/14/2016 12/14/2015  08/06/2015  Decreased Interest 0 0 0 0 0 0 0  Down, Depressed, Hopeless 0 0 0 0 0 0 0  PHQ - 2 Score 0 0 0 0 0 0 0     Review of Systems  Constitutional: Positive for appetite change and unexpected weight change.       Appetite poor and feels like he is still losing weight. (has not weighed since last visit)  HENT: Negative.   Eyes: Negative.   Respiratory: Negative.   Cardiovascular: Negative.   Gastrointestinal: Negative.   Endocrine: Negative.   Genitourinary: Positive for difficulty urinating.       Hesitancy  Musculoskeletal: Positive for gait problem.  Skin: Negative.   Allergic/Immunologic: Negative.   Neurological: Positive for weakness and numbness.       Tingling  Hematological: Bruises/bleeds easily.       On eliquis  Psychiatric/Behavioral: Negative.   All other systems reviewed and are negative.     Objective:   Physical Exam Constitutional: No distress . Vital signs reviewed. HENT: Normocephalic. Atraumatic. Eyes: EOMI. No discharge. Cardiovascular: No JVD. Respiratory: Normal effort. GI: Non-distended. Musc: No edema or tenderness in extremities Neurological: He is alert and oriented Follows basic commands Motor:  LUE: Shoulder abduction 4/5, elbow flex 4+/5, elbow extension 4/5, hand grip 4-/5 with apraxia LLE: HF, KE, ADF 4+/5 Mas: left elbow flexors: 1+/4, limited due to patient resistance Skin: Skin is warm and dry.  Psychiatric: He has a normal mood and affect. His behavior is normal. Thought content normal.     Assessment & Plan:  Right-handed male with history of diabetes mellitus, hypertension presents for follow up for right thalamic hemorrhage, now with some spasticity.   1. Left-sided weakness secondary to right thalamic hemorrhage secondary to hypertensive crisis now with spasticity  Resume therapies when possible, continues to await  Cont follow up with Neurology  Will schedule for Botulinum toxin injection in future (risks and  benefits discussed again)   Left Biceps: 50 units   Left upper traps: 25 units   Left Med Pec: 25 units   2. Pain Management:   ?No benefit with Baclofen, but patient would like to try it again  Tylenol as needed  Will consider Elavil 10 qhs,  pt does not want at present  D/ced Tramadol  Cont ROM  Cont Gabapentin 600 mg TID, unable to tolerate 900mg    3. Gait abnormality  Resume therapies when possible, continues to await  Cont walker/wheelchair for safety  4. Post stroke shoulder pain  Good benefit with pain in shoulder

## 2018-12-14 DIAGNOSIS — I69354 Hemiplegia and hemiparesis following cerebral infarction affecting left non-dominant side: Secondary | ICD-10-CM | POA: Diagnosis not present

## 2018-12-14 DIAGNOSIS — M792 Neuralgia and neuritis, unspecified: Secondary | ICD-10-CM | POA: Diagnosis not present

## 2018-12-14 DIAGNOSIS — I610 Nontraumatic intracerebral hemorrhage in hemisphere, subcortical: Secondary | ICD-10-CM | POA: Diagnosis not present

## 2018-12-21 DIAGNOSIS — N138 Other obstructive and reflux uropathy: Secondary | ICD-10-CM | POA: Diagnosis not present

## 2018-12-21 DIAGNOSIS — N451 Epididymitis: Secondary | ICD-10-CM | POA: Diagnosis not present

## 2018-12-21 DIAGNOSIS — N476 Balanoposthitis: Secondary | ICD-10-CM | POA: Diagnosis not present

## 2018-12-23 ENCOUNTER — Encounter: Payer: Medicare Other | Attending: Physical Medicine & Rehabilitation | Admitting: Physical Medicine & Rehabilitation

## 2018-12-23 ENCOUNTER — Encounter: Payer: Self-pay | Admitting: Physical Medicine & Rehabilitation

## 2018-12-23 ENCOUNTER — Other Ambulatory Visit: Payer: Self-pay

## 2018-12-23 VITALS — BP 144/92 | HR 72 | Temp 97.5°F | Ht 71.0 in | Wt 177.0 lb

## 2018-12-23 DIAGNOSIS — G8110 Spastic hemiplegia affecting unspecified side: Secondary | ICD-10-CM

## 2018-12-23 DIAGNOSIS — I10 Essential (primary) hypertension: Secondary | ICD-10-CM | POA: Diagnosis not present

## 2018-12-23 DIAGNOSIS — E119 Type 2 diabetes mellitus without complications: Secondary | ICD-10-CM | POA: Diagnosis not present

## 2018-12-23 NOTE — Progress Notes (Signed)
Botox: Procedure Note Patient Name: Marcus Beasley DOB: 1945-09-19 MRN: [id number]   Procedure: Botulinum toxin administration Guidance: EMG Diagnosis: Spastic left hemiparesis Date: 12/23/2018 Attending: Delice Lesch, MD   Informed consent: Risks, benefits & options of the procedure are explained to the patient (and/or family). The patient elects to proceed with procedure. Risks include but are not limited to weakness, respiratory distress, dry mouth, ptosis, antibody formation, worsening of some areas of function. Benefits include decreased abnormal muscle tone, improved hygiene and positioning, decreased skin breakdown and, in some cases, decreased pain. Options include conservative management with oral antispasticity agents, phenol chemodenervation of nerve or at motor nerve branches. More invasive options include intrathecal balcofen adminstration for appropriate candidates. Surgical options may include tendon lengthening or transposition or, rarely, dorsal rhizotomy.   History/Physical Examination: 73 y.o. right-handed male with history of diabetes mellitus, hypertension presents for follow up for right thalamic hemorrhage, now with some spasticity.   mAS 1/4 shoulder abductors, elbow flexors Previous Treatments: Therapy/Range of motion Indication for guidance: Target active muscules  Procedure: Botulinum toxin was mixed with preservative free saline with a dilution of 1cc to 100 units. Targeted limb and muscles were identified. The skin was prepped with alcohol swabs and placement of needle tip in targeted muscle was confirmed using appropriate guidance. Prior to injection, positioning of needle tip outside of blood vessel was determined by pulling back on syringe plunger.  MUSCLE UNITS Left upper traps 25U Left Med Biceps 50U Left Med Pec 25U  Total units used: 177 Complications: None Plan: RTC 6 weeks  Ankit Anil Patel 10:23 AM

## 2018-12-27 ENCOUNTER — Other Ambulatory Visit: Payer: Self-pay | Admitting: Internal Medicine

## 2018-12-27 NOTE — Telephone Encounter (Signed)
Medication Refill - Medication: metFORMIN (GLUCOPHAGE) 850 MG tablet Pt is out of this medication and does not have one to take tonight/ please advise    Has the patient contacted their pharmacy? Yes.   (Agent: If no, request that the patient contact the pharmacy for the refill.) (Agent: If yes, when and what did the pharmacy advise?)Pharmacy advised Pt that they needed Doctors approval   Preferred Pharmacy (with phone number or street name):  [id number] [place], [place] [phone] ([phone] (Fax)     Agent: Please be advised that RX refills may take up to 3 business days. We ask that you follow-up with your pharmacy.

## 2018-12-28 ENCOUNTER — Ambulatory Visit: Payer: Medicare Other | Admitting: Internal Medicine

## 2019-01-01 ENCOUNTER — Other Ambulatory Visit: Payer: Self-pay | Admitting: Physical Medicine & Rehabilitation

## 2019-01-13 DIAGNOSIS — M792 Neuralgia and neuritis, unspecified: Secondary | ICD-10-CM | POA: Diagnosis not present

## 2019-01-13 DIAGNOSIS — I610 Nontraumatic intracerebral hemorrhage in hemisphere, subcortical: Secondary | ICD-10-CM | POA: Diagnosis not present

## 2019-01-13 DIAGNOSIS — I69354 Hemiplegia and hemiparesis following cerebral infarction affecting left non-dominant side: Secondary | ICD-10-CM | POA: Diagnosis not present

## 2019-01-20 DIAGNOSIS — E785 Hyperlipidemia, unspecified: Secondary | ICD-10-CM | POA: Diagnosis not present

## 2019-01-20 DIAGNOSIS — Z8673 Personal history of transient ischemic attack (TIA), and cerebral infarction without residual deficits: Secondary | ICD-10-CM | POA: Diagnosis not present

## 2019-01-20 DIAGNOSIS — I10 Essential (primary) hypertension: Secondary | ICD-10-CM | POA: Diagnosis not present

## 2019-01-20 DIAGNOSIS — I480 Paroxysmal atrial fibrillation: Secondary | ICD-10-CM | POA: Diagnosis not present

## 2019-01-20 LAB — CBC WITH DIFFERENTIAL/PLATELET
Basophils Absolute: 0 10*3/uL (ref 0.0–0.2)
Basos: 1 %
EOS (ABSOLUTE): 0.1 10*3/uL (ref 0.0–0.4)
Eos: 2 %
Hematocrit: 44.3 % (ref 37.5–51.0)
Hemoglobin: 14.4 g/dL (ref 13.0–17.7)
Immature Grans (Abs): 0 10*3/uL (ref 0.0–0.1)
Immature Granulocytes: 0 %
Lymphocytes Absolute: 1.7 10*3/uL (ref 0.7–3.1)
Lymphs: 28 %
MCH: 28.5 pg (ref 26.6–33.0)
MCHC: 32.5 g/dL (ref 31.5–35.7)
MCV: 88 fL (ref 79–97)
Monocytes Absolute: 0.6 10*3/uL (ref 0.1–0.9)
Monocytes: 9 %
Neutrophils Absolute: 3.6 10*3/uL (ref 1.4–7.0)
Neutrophils: 60 %
Platelets: 303 10*3/uL (ref 150–450)
RBC: 5.06 x10E6/uL (ref 4.14–5.80)
RDW: 12.5 % (ref 11.6–15.4)
WBC: 6 10*3/uL (ref 3.4–10.8)

## 2019-01-20 LAB — LIPID PANEL
Chol/HDL Ratio: 3.2 ratio (ref 0.0–5.0)
Cholesterol, Total: 141 mg/dL (ref 100–199)
HDL: 44 mg/dL (ref >39)
LDL Calculated: 85 mg/dL (ref 0–99)
Triglycerides: 60 mg/dL (ref 0–149)
VLDL Cholesterol Cal: 12 mg/dL (ref 5–40)

## 2019-01-20 LAB — BASIC METABOLIC PANEL
BUN/Creatinine Ratio: 17 (ref 10–24)
BUN: 15 mg/dL (ref 8–27)
CO2: 22 mmol/L (ref 20–29)
Calcium: 10 mg/dL (ref 8.6–10.2)
Chloride: 102 mmol/L (ref 96–106)
Creatinine, Ser: 0.89 mg/dL (ref 0.76–1.27)
GFR calc Af Amer: 97 mL/min/{1.73_m2} (ref >59)
GFR calc non Af Amer: 84 mL/min/{1.73_m2} (ref >59)
Glucose: 117 mg/dL — ABNORMAL HIGH (ref 65–99)
Potassium: 4.6 mmol/L (ref 3.5–5.2)
Sodium: 140 mmol/L (ref 134–144)

## 2019-01-20 LAB — HEPATIC FUNCTION PANEL
ALT: 14 IU/L (ref 0–44)
AST: 16 IU/L (ref 0–40)
Albumin: 4.6 g/dL (ref 3.7–4.7)
Alkaline Phosphatase: 80 IU/L (ref 39–117)
Bilirubin Total: 0.4 mg/dL (ref 0.0–1.2)
Bilirubin, Direct: 0.11 mg/dL (ref 0.00–0.40)
Total Protein: 7 g/dL (ref 6.0–8.5)

## 2019-01-26 ENCOUNTER — Telehealth: Payer: Self-pay

## 2019-01-26 NOTE — Telephone Encounter (Signed)
Follow up   Patient states that he wants to stay on eliquis. Please call the patient to discuss.

## 2019-01-26 NOTE — Telephone Encounter (Signed)
Spoke to patient he stated Eliquis too expensive.Stated he would like to take Coumadin.Message sent to [place] for a order.

## 2019-01-26 NOTE — Telephone Encounter (Signed)
Returned call to patient he decided he will stay on Eliquis.

## 2019-02-03 ENCOUNTER — Ambulatory Visit: Payer: Medicare Other | Admitting: Registered Nurse

## 2019-02-03 ENCOUNTER — Encounter: Payer: Medicare Other | Attending: Physical Medicine & Rehabilitation | Admitting: Physical Medicine & Rehabilitation

## 2019-02-03 ENCOUNTER — Encounter: Payer: Self-pay | Admitting: Physical Medicine & Rehabilitation

## 2019-02-03 ENCOUNTER — Other Ambulatory Visit: Payer: Self-pay

## 2019-02-03 VITALS — BP 151/102 | HR 78 | Temp 98.1°F | Ht 71.0 in | Wt 174.0 lb

## 2019-02-03 DIAGNOSIS — I69354 Hemiplegia and hemiparesis following cerebral infarction affecting left non-dominant side: Secondary | ICD-10-CM | POA: Diagnosis not present

## 2019-02-03 DIAGNOSIS — M792 Neuralgia and neuritis, unspecified: Secondary | ICD-10-CM | POA: Diagnosis not present

## 2019-02-03 DIAGNOSIS — E119 Type 2 diabetes mellitus without complications: Secondary | ICD-10-CM | POA: Diagnosis not present

## 2019-02-03 DIAGNOSIS — R269 Unspecified abnormalities of gait and mobility: Secondary | ICD-10-CM | POA: Diagnosis not present

## 2019-02-03 DIAGNOSIS — I10 Essential (primary) hypertension: Secondary | ICD-10-CM | POA: Insufficient documentation

## 2019-02-03 DIAGNOSIS — G8110 Spastic hemiplegia affecting unspecified side: Secondary | ICD-10-CM | POA: Diagnosis not present

## 2019-02-03 NOTE — Progress Notes (Signed)
Subjective:    Patient ID: Marcus Beasley, male    DOB: December 21, 1945, 73 y.o.   MRN: [id number]  HPI:  Right-handed male with history of diabetes mellitus, hypertension presents for follow up for right thalamic hemorrhage.   Last clinic visit 12/23/2018.  Patient seen independently by resident physician and then seen and examined again with myself and resident physician.  Patient had Dysport injection on last visit.  Since that time, he notes some improvement with spasticity.  He notes intermittent improvement.  He notes tightness in shoulder.  He is doing HEP. He is not following up with therapies due to Covid. He notes decreased in sensation and tingling in his LUE. He notes benefits with Baclofen and Gabapentin. Denies falls.  Pain Inventory Average Pain 3 Pain Right Now 3 My pain is constant, tingling and heaviness left side  In the last 24 hours, has pain interfered with the following? General activity 0 Relation with others 0 Enjoyment of life 0 What TIME of day is your pain at its worst? na Sleep (in general) Fair  Pain is worse with: no pain Pain improves with: medication and no pain Relief from Meds: no pain  Mobility walk with assistance use a cane ability to climb steps?  yes Do you have any goals in this area?  yes  Function retired I need assistance with the following:  meal prep, household duties and shopping  Neuro/Psych bladder control problems weakness numbness tingling trouble walking  Prior Studies Any changes since last visit?  no  Physicians involved in your care Any changes since last visit?  yes Urologist-Dr. Chrystine Oiler   Family History  Problem Relation Age of Onset  . Leukemia Mother   . Heart attack Father        MI age 10  . Heart disease Sister        age 24  . Cancer - Other Sister        type  . Heart disease Brother        age 23  . Kidney disease Brother        HD  . Diabetes Maternal Grandmother   . Prostate cancer  Neg Hx   . Colon cancer Neg Hx   . Esophageal cancer Neg Hx   . Rectal cancer Neg Hx   . Stomach cancer Neg Hx    Social History   Socioeconomic History  . Marital status: Married    Spouse name: Not on file  . Number of children: 3  . Years of education: 73  . Highest education level: Not on file  Occupational History  . 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 Financial resource strain: Not on file  . Food insecurity    Worry: Not on file    Inability: Not on file  . Transportation needs    Medical: Not on file    Non-medical: Not on file  Tobacco Use  . Smoking status: Never Smoker  . Smokeless tobacco: Never Used  Substance and Sexual Activity  . Alcohol use: Yes    Comment: occ. beer  . Drug use: No  . Sexual activity: Yes    Partners: Female  Lifestyle  . Physical activity    Days per week: Not on file    Minutes per session: Not on file  . Stress: Not on file  Relationships  . Social Herbalist on phone: Not  on file    Gets together: Not on file    Attends religious service: Not on file    Active member of club or organization: Not on file    Attends meetings of clubs or organizations: Not on file    Relationship status: Not on file  Other Topics Concern  . 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 2 dtrs , 1 son - homicide. 5 grandchildren.     Past Surgical History:  Procedure Laterality Date  . ABCESS DRAINAGE     abdomen- 26 day hospitalization 1968  . COLONOSCOPY  2016  . HERNIA REPAIR  summer '11   umbilical, dr Ninfa Linden   . POLYPECTOMY    . PROSTATE BIOPSY  06-2013 , 07-2017   (-), (-)   Past Medical History:  Diagnosis Date  . BPH (benign prostatic hyperplasia)    (-) Bx 2015  . Diabetes mellitus without complication ([place])   . Elevated PSA    Prostate Bx in 06/2013 was benign  . HTN (hypertension)   . Hyperlipidemia   . Macular  degeneration, age related    BP (!) 151/102   Pulse 78   Temp 98.1 F (36.7 C)   Ht 5\' 11"  (1.803 m)   Wt 174 lb (78.9 kg)   SpO2 96%   BMI 24.27 kg/m   Opioid Risk Score:   Fall Risk Score:  `1  Depression screen PHQ 2/9  Depression screen [facility] 2/9 11/11/2018 10/06/2018 04/28/2018 08/25/2017 04/14/2016 12/14/2015 08/06/2015  Decreased Interest 0 0 0 0 0 0 0  Down, Depressed, Hopeless 0 0 0 0 0 0 0  PHQ - 2 Score 0 0 0 0 0 0 0     Review of Systems  Constitutional: Positive for appetite change and unexpected weight change.       Appetite poor and feels like he is still losing weight. (has not weighed since last visit)  HENT: Negative.   Eyes: Negative.   Respiratory: Negative.   Cardiovascular: Negative.   Gastrointestinal: Negative.   Endocrine: Negative.   Genitourinary: Positive for difficulty urinating.       Hesitancy  Musculoskeletal: Positive for gait problem.  Skin: Negative.   Allergic/Immunologic: Negative.   Neurological: Positive for weakness and numbness.       Tingling  Hematological: Bruises/bleeds easily.       On eliquis  Psychiatric/Behavioral: Negative.   All other systems reviewed and are negative.     Objective:   Physical Exam Constitutional: No distress . Vital signs reviewed. HENT: Normocephalic. Atraumatic.  Eyes: EOMI. No discharge. Cardiovascular:No JVD. Respiratory: Normal effort. GI: Non-distended. Musc: No edema or tenderness in extremities Neurological: He is alert and oriented Follows basic commands Motor:  LUE: Shoulder abduction 4/5, elbow flex 4+/5, elbow extension 4/5, hand grip 4-/5 with apraxia, stable LLE: HF, KE, ADF 4+/5,stable Mas: left elbow flexors: 1+/4, limited due to patient resistance, but appears to have some improvement Skin: Skin is warm and dry.  Psychiatric: He has a normal mood and affect.     Assessment & Plan:  Right-handed male with history of diabetes mellitus, hypertension presents for follow up for  right thalamic hemorrhage, now with some spasticity.   1. Left-sided weakness secondary to right thalamic hemorrhage secondary to hypertensive crisis now with spasticity  Resume therapies when possible, continues to await  Cont follow up with Neurology  Will increase Botox injection in future (risks and benefits discussed  again)   Left Biceps: 75 units   Left upper traps: 50 units   Left Med Pec: 50 units   2. Pain Management:   ?No benefit with Baclofen, but patient would like to try it again  Tylenol as needed  Will consider Elavil 10 qhs, pt does not want at present  D/ced Tramadol  Cont ROM  Cont Gabapentin 600 mg TID, unable to tolerate 900mg , will consider transition to Lyrica   3. Gait abnormality  Resume therapies when possible, continues to await  Cont walker/wheelchair for safety  4. Post stroke shoulder pain  Good benefit with pain in shoulder

## 2019-02-03 NOTE — Progress Notes (Addendum)
CC: Stroke f/u  HPI:  [name] is a 73 y.o. right-handed male with a history of DM, and HTN who presents for follow-up for right thalamic hemorrhagic stroke.  His deficits are mainly left upper extremity weakness and spasticity.  His last clinic visit was on 12/23/2018.  At his last visit the patient received botulinum toxin injections in his left trapezius, left bicep, and left pectoralis major muscles.  Since that time, the patient says his stiffness has improved, however the stiffness does return intermittently.  He does note that his left shoulder is tighter than his other muscles.  He has not been able to see physical therapist due to [place], however he tries to do some exercises on his own at home.  He does complain of decreased sensation, numbness and tingling in his left upper extremity.  The baclofen and gabapentin helps.  Past Medical History:  Diagnosis Date  . BPH (benign prostatic hyperplasia)    (-) Bx 2015  . Diabetes mellitus without complication ([place])   . Elevated PSA    Prostate Bx in 06/2013 was benign  . HTN (hypertension)   . Hyperlipidemia   . Macular degeneration, age related    Review of Systems: All systems have been reviewed and are otherwise negative unless mentioned in HPI  Physical Exam:  Vitals:   02/03/19 1024  BP: (!) 151/102  Pulse: 78  Temp: 98.1 F (36.7 C)  SpO2: 96%  Weight: 78.9 kg  Height: 5\' 11"  (1.803 m)   Physical Exam Vitals signs reviewed.  Constitutional:      General: He is not in acute distress.    Appearance: Normal appearance. He is normal weight. He is not ill-appearing, toxic-appearing or diaphoretic.  HENT:     Head: Atraumatic.     Mouth/Throat:     Pharynx: No oropharyngeal exudate.  Eyes:     General: No scleral icterus.       Right eye: No discharge.        Left eye: No discharge.     Extraocular Movements: Extraocular movements intact.  Cardiovascular:     Comments: Warm and well-perfused Pulmonary:     Effort: Pulmonary effort is normal.  Musculoskeletal:        General: Tenderness (Left shoulder muscle tightness, slight tenderness to palpation) present.     Comments: LUE: 4/5 strength with shoulder abduction and adduction, shoulder flexion and extension, and elbow flexion and extension. Grip strength 4/5. Grade 2 spacticity present  RUE: 5/5 strength diffusely  LLE: 5/5 strength diffusely  RLE: 5/5 strength diffusely  Skin:    General: Skin is warm.  Neurological:     Mental Status: He is alert and oriented to person, place, and time.     Sensory: Sensory deficit (Decreased sensation to light touch in left upper and lower extremity) present.  Psychiatric:        Mood and Affect: Mood normal.    Assessment & Plan:   In summary, 73 y.o. right-handed male with a history of DM, and HTN who presents for follow-up for right thalamic hemorrhagic stroke. His deficits are mainly left upper extremity weakness and spasticity. His spasticity has improved since receiving botulism toxin injections at his previous visit 6 weeks ago.   #R thalamic hemorrhagic stroke #L sided weakness & spasticity  - Continue with at home PT for now, f/u with PT in outpatient setting when able - Continue f/u with Neurology - Continue baclofen - Repeat botilinum toxin injections in  the future. Plan noted below:  - L bicep: 75U  - L upper traps: 50U  - L med pec: 50U  #LUE numbness & tingling: Pt notes this is about the same as it was before, has not gotten worse. - Continue Gabapentin 600 TID, pt notes he wants to continue trying this instead of swtiching to new drug. Will consider Elavil or Lyrica in the future  #F/u - 6 weeks for repeat botulinum toxin injection  Patient seen with Dr. Posey Pronto  Please see attending progress note.

## 2019-02-24 ENCOUNTER — Telehealth: Payer: Self-pay

## 2019-02-24 DIAGNOSIS — R269 Unspecified abnormalities of gait and mobility: Secondary | ICD-10-CM

## 2019-02-24 DIAGNOSIS — I69354 Hemiplegia and hemiparesis following cerebral infarction affecting left non-dominant side: Secondary | ICD-10-CM

## 2019-02-24 DIAGNOSIS — G8929 Other chronic pain: Secondary | ICD-10-CM

## 2019-02-24 DIAGNOSIS — M25512 Pain in left shoulder: Secondary | ICD-10-CM

## 2019-02-24 DIAGNOSIS — G8110 Spastic hemiplegia affecting unspecified side: Secondary | ICD-10-CM

## 2019-02-24 NOTE — Telephone Encounter (Signed)
Given the fact that patient is scheduled to receive another botulinum toxin injection relatively soon, will plan to make therapy referrals after that point.  Thanks.

## 2019-02-24 NOTE — Telephone Encounter (Signed)
Patent needs another referral to Outpatient PT and OT. Had to stop due to Covid 19. Is this okay to send another referral?

## 2019-02-25 NOTE — Telephone Encounter (Addendum)
His appointment is 03/30/19. Should he wait a full month before receiving any therapy? He says he would like to go ahead and go to therapy before then.

## 2019-02-26 NOTE — Telephone Encounter (Signed)
We can make a referral, however, if he has limited therapies, he may not be able to maximize the botulinum injection.

## 2019-03-11 ENCOUNTER — Other Ambulatory Visit: Payer: Self-pay | Admitting: Physical Medicine & Rehabilitation

## 2019-03-14 NOTE — Telephone Encounter (Signed)
So with that being said about the limited visits, how should the referral be written?

## 2019-03-15 NOTE — Telephone Encounter (Signed)
I would ask him how many visits he has left.  If insurance has approved until the end of the year, then we can make the referral, however, if he only has a limited number of visit, I would recommend waiting.  Thanks.

## 2019-03-16 NOTE — Telephone Encounter (Signed)
Patient states he should have some visits left for the year because he didn't go every week before Covid. Sending over referral

## 2019-03-16 NOTE — Addendum Note (Signed)
Addended by: Jasmine December T on: 03/16/2019 01:36 PM   Modules accepted: Orders

## 2019-03-28 ENCOUNTER — Other Ambulatory Visit: Payer: Self-pay

## 2019-03-29 ENCOUNTER — Ambulatory Visit (INDEPENDENT_AMBULATORY_CARE_PROVIDER_SITE_OTHER): Payer: Medicare Other | Admitting: Internal Medicine

## 2019-03-29 ENCOUNTER — Encounter: Payer: Self-pay | Admitting: Internal Medicine

## 2019-03-29 VITALS — BP 152/96 | HR 71 | Temp 97.6°F | Resp 16 | Ht 71.0 in | Wt 177.2 lb

## 2019-03-29 DIAGNOSIS — E119 Type 2 diabetes mellitus without complications: Secondary | ICD-10-CM

## 2019-03-29 DIAGNOSIS — E785 Hyperlipidemia, unspecified: Secondary | ICD-10-CM | POA: Diagnosis not present

## 2019-03-29 DIAGNOSIS — Z23 Encounter for immunization: Secondary | ICD-10-CM | POA: Diagnosis not present

## 2019-03-29 DIAGNOSIS — R399 Unspecified symptoms and signs involving the genitourinary system: Secondary | ICD-10-CM

## 2019-03-29 DIAGNOSIS — Z0000 Encounter for general adult medical examination without abnormal findings: Secondary | ICD-10-CM

## 2019-03-29 LAB — URINALYSIS, ROUTINE W REFLEX MICROSCOPIC
Bilirubin Urine: NEGATIVE
Hgb urine dipstick: NEGATIVE
Ketones, ur: NEGATIVE
Leukocytes,Ua: NEGATIVE
Nitrite: NEGATIVE
RBC / HPF: NONE SEEN (ref 0–?)
Specific Gravity, Urine: 1.02 (ref 1.000–1.030)
Total Protein, Urine: NEGATIVE
Urine Glucose: NEGATIVE
Urobilinogen, UA: 0.2 (ref 0.0–1.0)
pH: 6 (ref 5.0–8.0)

## 2019-03-29 LAB — MICROALBUMIN / CREATININE URINE RATIO
Creatinine,U: 154.3 mg/dL
Microalb Creat Ratio: 0.6 mg/g (ref 0.0–30.0)
Microalb, Ur: 1 mg/dL (ref 0.0–1.9)

## 2019-03-29 LAB — HEMOGLOBIN A1C: Hgb A1c MFr Bld: 6.6 % — ABNORMAL HIGH (ref 4.6–6.5)

## 2019-03-29 MED ORDER — AMLODIPINE BESYLATE 5 MG PO TABS: 5.0000 mg | ORAL_TABLET | Freq: Every day | ORAL | 3 refills | Status: DC

## 2019-03-29 NOTE — Progress Notes (Signed)
Subjective:    Patient ID: Marcus Beasley, male    DOB: 1946-02-04, 72 y.o.   MRN: [id number]  DOS:  03/29/2019 Type of visit - description: Here for CPX In general feeling well but he still reports occasional dysuria without gross hematuria, urinary frequency or urinary difficulty. Occasionally has a ill defined discomfort around the umbilical hernia that was repaired and the lower abdomen. Occasionally feels warm at night but denies actual nocturnal chills or weight loss. Status post a stroke, denies any falls.   Wt Readings from Last 3 Encounters:  03/30/19 177 lb (80.3 kg)  03/29/19 177 lb 4 oz (80.4 kg)  02/03/19 174 lb (78.9 kg)     Review of Systems  Other than above, a 14 point review of systems is negative   Past Medical History:  Diagnosis Date  . BPH (benign prostatic hyperplasia)    (-) Bx 2015  . Diabetes mellitus without complication ([place])   . Elevated PSA    Prostate Bx in 06/2013 was benign  . HTN (hypertension)   . Hyperlipidemia   . Macular degeneration, age related     Past Surgical History:  Procedure Laterality Date  . ABCESS DRAINAGE     abdomen- 26 day hospitalization 1968  . COLONOSCOPY  2016  . HERNIA REPAIR  summer '11   umbilical, dr Ninfa Linden   . POLYPECTOMY    . PROSTATE BIOPSY  06-2013 , 07-2017   (-), (-)    Social History   Socioeconomic History  . Marital status: Married    Spouse name: Not on file  . Number of children: 3  . Years of education: 25  . Highest education level: Not on file  Occupational History  . 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 Financial resource strain: Not on file  . Food insecurity    Worry: Not on file    Inability: Not on file  . Transportation needs    Medical: Not on file    Non-medical: Not on file  Tobacco Use  . Smoking status: Never Smoker  . Smokeless tobacco: Never Used  Substance and Sexual Activity  . Alcohol use: Yes   Comment: occ. beer  . Drug use: No  . Sexual activity: Yes    Partners: Female  Lifestyle  . Physical activity    Days per week: Not on file    Minutes per session: Not on file  . Stress: Not on file  Relationships  . Social Herbalist on phone: Not on file    Gets together: Not on file    Attends religious service: Not on file    Active member of club or organization: Not on file    Attends meetings of clubs or organizations: Not on file    Relationship status: Not on file  . Intimate partner violence    Fear of current or ex partner: Not on file    Emotionally abused: Not on file    Physically abused: Not on file    Forced sexual activity: Not on file  Other Topics Concern  . 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 2 dtrs , 1 son - homicide. 5 grandchildren.     Household: pt, wife, daughter and son   Does not drive       Allergies as of 03/29/2019   No Known Allergies  Medication List       Accurate as of March 29, 2019 11:59 PM. If you have any questions, ask your nurse or doctor.        amLODipine 5 MG tablet Commonly known as: NORVASC Take 1 tablet (5 mg total) by mouth daily. What changed:   medication strength  how much to take Changed by: Kathlene November, MD   apixaban 5 MG Tabs tablet Commonly known as: Eliquis Take 1 tablet (5 mg total) by mouth 2 (two) times daily.   atorvastatin 40 MG tablet Commonly known as: LIPITOR Take 1 tablet (40 mg total) by mouth daily.   diltiazem 240 MG 24 hr capsule Commonly known as: CARDIZEM CD Take 1 capsule (240 mg total) by mouth daily.   gabapentin 600 MG tablet Commonly known as: NEURONTIN TAKE 1 TABLET BY MOUTH 3 TIMES A DAY   glucose blood test strip Commonly known as: Electronics engineer BLOOD SUGAR NO MORE THAN TWICE DAILY   meclizine 25 MG tablet Commonly known as: ANTIVERT Take 1 tablet (25 mg total) by mouth 2 (two) times daily as needed for  dizziness.   metFORMIN 850 MG tablet Commonly known as: GLUCOPHAGE Take 1 tablet (850 mg total) by mouth 2 (two) times daily with a meal.   metoprolol tartrate 50 MG tablet Commonly known as: LOPRESSOR Take 1 tablet (50 mg total) by mouth 2 (two) times daily.   multivitamin with minerals Tabs tablet Take 1 tablet by mouth daily.   PRESERVISION/LUTEIN PO Take 1 tablet by mouth daily.   tamsulosin 0.4 MG Caps capsule Commonly known as: FLOMAX Take 0.4 mg by mouth daily.      Family History  Problem Relation Age of Onset  . Leukemia Mother   . Heart attack Father        MI age 26  . Heart disease Sister        age 70  . Cancer - Other Sister        type  . Heart disease Brother        age 62  . Kidney disease Brother        HD  . Diabetes Maternal Grandmother   . Prostate cancer Neg Hx   . Colon cancer Neg Hx   . Esophageal cancer Neg Hx   . Rectal cancer Neg Hx   . Stomach cancer Neg Hx         Objective:   Physical Exam BP (!) 152/96 (BP Location: Right Arm, Patient Position: Sitting, Cuff Size: Small)   Pulse 71   Temp 97.6 F (36.4 C) (Temporal)   Resp 16   Ht 5\' 11"  (1.803 m)   Wt 177 lb 4 oz (80.4 kg)   SpO2 97%   BMI 24.72 kg/m  General: Well developed, NAD, BMI noted Neck: No  thyromegaly  HEENT:  Normocephalic . Face symmetric, atraumatic Lungs:  CTA B Normal respiratory effort, no intercostal retractions, no accessory muscle use. Heart: RRR,  no murmur.  No pretibial edema bilaterally  Abdomen:  Not distended, soft, non-tender. No rebound or rigidity.   Skin: Exposed areas without rash. Not pale. Not jaundice Neurologic:  alert & oriented X3.  Speech normal, gait testing with previous stroke, uses a cane Strength decreased on the left side more noticeable on the upper extremity. Psych: Cognition and judgment appear intact.  Cooperative with normal attention span and concentration.  Behavior appropriate. No anxious or depressed  appearing.     Assessment  Assessment DM  ---->  DX 07-2015, A1c 8.0; started metformin Neuropathy, mild.  DX 08-2017 (in sensitive type) HTN 24 9 Hyperlipidemia UROLOGY: BPH, increased PSA.BX (-) -2015, (-) 07-2017 per pt  Hemorrhagic thalamic stroke 02-2018 Paroxysmal atrial fibrillation found 02/2018 Increase LFTs: See OV 07-2018.  Hep C serologies negative, hep B surface Ab+, hep B core antibody negative (consistent with vaccination)  PLAN DM: On metformin, check A1c HTN: BP slightly elevated, continue metoprolol, Cardizem, increase amlodipine to 5 mg, reassess in 2 months. Hyperlipidemia: Excellent control, continue Lipitor BPH, LUTS: Continue with mild dysuria, no other symptoms, adamantly request "a penicillin shot". Explained there is no indication for antibiotic at this point, will check a UA and urine culture first. Also adamantly requests to be checked for herpes as he believes the urology recommended that.  Will check.  Denies any rash. Paroxysmal A. fib: Seems to be in sinus rhythm today, on Eliquis. RTC 2 months

## 2019-03-29 NOTE — Progress Notes (Signed)
Pre visit review using our clinic review tool, if applicable. No additional management support is needed unless otherwise documented below in the visit note. 

## 2019-03-29 NOTE — Patient Instructions (Addendum)
Please schedule Medicare Wellness with Glen[facility]aring.   Per our records you are due for an eye exam. Please contact your eye doctor to schedule an appointment. Please have them send copies of your office visit notes to [place]. Our fax number is [phone].   GO TO THE LAB : Get the blood work     GO TO THE FRONT DESK Schedule your next appointment for a checkup in 2 months   Increase amlodipine from 2.5 mg to 5 mg daily  Check the  blood pressure 2 or 3 times a week BP GOAL is between 110/65 and  135/85. If it is consistently higher or lower, let me know

## 2019-03-29 NOTE — Progress Notes (Deleted)
   DM: 117-130 monitoring at home, taking metformin HTN: elevated at home, 150/95 ish at home

## 2019-03-30 ENCOUNTER — Encounter: Payer: Medicare Other | Attending: Physical Medicine & Rehabilitation | Admitting: Physical Medicine & Rehabilitation

## 2019-03-30 ENCOUNTER — Encounter: Payer: Self-pay | Admitting: Physical Medicine & Rehabilitation

## 2019-03-30 ENCOUNTER — Other Ambulatory Visit: Payer: Self-pay

## 2019-03-30 VITALS — BP 124/89 | HR 90 | Temp 97.7°F | Ht 71.0 in | Wt 177.0 lb

## 2019-03-30 DIAGNOSIS — I10 Essential (primary) hypertension: Secondary | ICD-10-CM | POA: Insufficient documentation

## 2019-03-30 DIAGNOSIS — E119 Type 2 diabetes mellitus without complications: Secondary | ICD-10-CM | POA: Diagnosis not present

## 2019-03-30 DIAGNOSIS — G8110 Spastic hemiplegia affecting unspecified side: Secondary | ICD-10-CM | POA: Diagnosis not present

## 2019-03-30 NOTE — Assessment & Plan Note (Signed)
-  Td 08-2017 - ;Pneumonia shot 2011 - Prevnar 2014 - zostavax 05-2015 -had shingrex #1 at work ? -Flu shot today - CCS: Colonoscopy 06-2014, had polyps, cscope 10/2017, next per GI - Prostate cancer screening per urology.  -Diet and exercise discussed -Labs: Reviewed, will check a A1c, UA urine culture

## 2019-03-30 NOTE — Progress Notes (Signed)
Botox: Procedure Note Patient Name: Marcus Beasley DOB: [birth year] MRN: [id number]   Procedure: Botulinum toxin administration Guidance: EMG Diagnosis: Spastic left hemiparesis Attending: Delice Lesch, MD  Date: 03/30/2019  Informed consent: Risks, benefits & options of the procedure are explained to the patient (and/or family). The patient elects to proceed with procedure. Risks include but are not limited to weakness, respiratory distress, dry mouth, ptosis, antibody formation, worsening of some areas of function. Benefits include decreased abnormal muscle tone, improved hygiene and positioning, decreased skin breakdown and, in some cases, decreased pain. Options include conservative management with oral antispasticity agents, phenol chemodenervation of nerve or at motor nerve branches. More invasive options include intrathecal balcofen adminstration for appropriate candidates. Surgical options may include tendon lengthening or transposition or, rarely, dorsal rhizotomy.   History/Physical Examination: 73 y.o. right-handed male with history of diabetes mellitus, hypertension presents for follow up for right thalamic hemorrhage, now with some spasticity.   MAS:   1/4 shoulder abductors, elbow flexors    Previous Treatments: Therapy/Range of motion Indication for guidance: Target active muscules  Procedure: Botulinum toxin was mixed with preservative free saline with a dilution of 1cc to 100 units. Targeted limb and muscles were identified. The skin was prepped with alcohol swabs and placement of needle tip in targeted muscle was confirmed using appropriate guidance. Prior to injection, positioning of needle tip outside of blood vessel was determined by pulling back on syringe plunger.  MUSCLE UNITS Left upper traps 50U Left Med Biceps 75U Left Med Pec 50U  Total units used: 175 Total units discarded due to unavoidable waster: 25 units  Complications: None Plan: RTC 6 weeks  Marcus Beasley  Marcus Beasley Marcus Beasley 10:52 AM

## 2019-03-30 NOTE — Assessment & Plan Note (Addendum)
DM: On metformin, check A1c HTN: BP slightly elevated, continue metoprolol, Cardizem, increase amlodipine to 5 mg, reassess in 2 months. Hyperlipidemia: Excellent control, continue Lipitor BPH, LUTS: Continue with mild dysuria, no other symptoms, adamantly request "a penicillin shot". Explained there is no indication for antibiotic at this point, will check a UA and urine culture first. Also adamantly requests to be checked for herpes as he believes the urology recommended that.  Will check.  Denies any rash. Paroxysmal A. fib: Seems to be in sinus rhythm today, on Eliquis. RTC 2 months

## 2019-03-31 LAB — URINE CULTURE
MICRO NUMBER:: 959159
SPECIMEN QUALITY:: ADEQUATE

## 2019-03-31 LAB — HSV(HERPES SIMPLEX VRS) I + II AB-IGG
HAV 1 IGG,TYPE SPECIFIC AB: 20.3 index — ABNORMAL HIGH
HSV 2 IGG,TYPE SPECIFIC AB: 1.24 index — ABNORMAL HIGH

## 2019-04-08 ENCOUNTER — Other Ambulatory Visit: Payer: Self-pay | Admitting: Physician Assistant

## 2019-04-11 ENCOUNTER — Ambulatory Visit: Payer: Medicare Other | Admitting: Physical Therapy

## 2019-04-11 ENCOUNTER — Other Ambulatory Visit: Payer: Self-pay

## 2019-04-11 ENCOUNTER — Ambulatory Visit: Payer: Medicare Other | Attending: Physical Medicine & Rehabilitation | Admitting: Occupational Therapy

## 2019-04-11 DIAGNOSIS — M6281 Muscle weakness (generalized): Secondary | ICD-10-CM | POA: Insufficient documentation

## 2019-04-11 DIAGNOSIS — R278 Other lack of coordination: Secondary | ICD-10-CM | POA: Insufficient documentation

## 2019-04-11 DIAGNOSIS — G8929 Other chronic pain: Secondary | ICD-10-CM | POA: Diagnosis not present

## 2019-04-11 DIAGNOSIS — R2681 Unsteadiness on feet: Secondary | ICD-10-CM | POA: Diagnosis not present

## 2019-04-11 DIAGNOSIS — R208 Other disturbances of skin sensation: Secondary | ICD-10-CM

## 2019-04-11 DIAGNOSIS — I69254 Hemiplegia and hemiparesis following other nontraumatic intracranial hemorrhage affecting left non-dominant side: Secondary | ICD-10-CM | POA: Insufficient documentation

## 2019-04-11 DIAGNOSIS — M25512 Pain in left shoulder: Secondary | ICD-10-CM | POA: Diagnosis not present

## 2019-04-11 DIAGNOSIS — R2689 Other abnormalities of gait and mobility: Secondary | ICD-10-CM | POA: Insufficient documentation

## 2019-04-11 DIAGNOSIS — R209 Unspecified disturbances of skin sensation: Secondary | ICD-10-CM | POA: Insufficient documentation

## 2019-04-11 DIAGNOSIS — I69118 Other symptoms and signs involving cognitive functions following nontraumatic intracerebral hemorrhage: Secondary | ICD-10-CM

## 2019-04-11 NOTE — Therapy (Signed)
[facility] [address] [place], [place], [address] Phone: [phone]   Fax:  [phone]  Occupational Therapy Evaluation  Patient Details  Name: Marcus Beasley MRN: [id number] Date of Birth: 03/29/1946 Referring Provider (OT): Delice Lesch   Encounter Date: 04/11/2019  OT End of Session - 04/11/19 0839    Visit Number  1    Number of Visits  17    Date for OT Re-Evaluation  06/11/19    Authorization Type  UHC MCR    Authorization - Visit Number  1    Authorization - Number of Visits  10    OT Start Time  0800    OT Stop Time  0840    OT Time Calculation (min)  40 min    Activity Tolerance  Patient tolerated treatment well    Behavior During Therapy  [facility] for tasks assessed/performed       Past Medical History:  Diagnosis Date  . BPH (benign prostatic hyperplasia)    (-) Bx 2015  . Diabetes mellitus without complication ([place])   . Elevated PSA    Prostate Bx in 06/2013 was benign  . HTN (hypertension)   . Hyperlipidemia   . Macular degeneration, age related     Past Surgical History:  Procedure Laterality Date  . ABCESS DRAINAGE     abdomen- 26 day hospitalization 1968  . COLONOSCOPY  2016  . HERNIA REPAIR  summer '11   umbilical, dr Ninfa Linden   . POLYPECTOMY    . PROSTATE BIOPSY  06-2013 , 07-2017   (-), (-)    There were no vitals filed for this visit.  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 PMH: HTN, HLD, DM, A-fib    Limitations  Fall risk    Patient Stated Goals  get more function in my Lt arm and hand    Currently in Pain?  No/denies   however pain in shoulder past midrange 5-8/10       [facility] OT Assessment - 04/11/19 0805      Assessment   Medical Diagnosis  Lt spastic hemiplegia (secondary to Rt thalamic hemorrhage)    Referring Provider (OT)  Ankit Patel    Onset Date/Surgical Date  03/12/18    Hand Dominance  Right    Prior  Therapy  Had therapy in OP PT clinic until 08/2018, COVID restrictions      Precautions   Precautions  Fall      Balance Screen   Has the patient fallen in the past 6 months  No      Home  Environment   Additional Comments  Pt lives w/ wife in 3 story home, bedroom/bathroom on 2nd level but has a bed and 1/2 bath on main level right now. Pt has 5 steps to enter home w/ handrails on both sides.     Lives With  Spouse;Daughter      Prior Function   Level of Independence  Independent   (prior to stroke)   Vocation  Retired    Leisure  Enjoyed walking, Haematologist, cooking      ADL   Eating/Feeding  Modified independent    Grooming  Independent    Upper Body Bathing  Minimal assistance    Lower Body Bathing  Modified independent    Upper Body Dressing  Minimal assistance   for jacket   Lower Body Dressing  Minimal assistance   for tying  shoes   Toilet Transfer  Modified independent    [place] Transfer  Supervision/safety      IADL   Shopping  --   does not do because of covid   Light Housekeeping  Performs light daily tasks such as dishwashing, bed making   folding clothes, vacuuming   Meal Prep  --   Pt cooking with some assist   Community Mobility  Relies on family or friends for transportation    Medication Management  Takes responsibility if medication is prepared in advance in seperate dosage      Mobility   Mobility Status Comments  walks w/ quad cane mostly      Written Expression   Dominant Hand  Right      Vision - History   Baseline Vision  Wears glasses only for reading    Visual History  Macular degeneration      Cognition   Cognition Comments  denies changes      Sensation   Light Touch  Impaired Detail    Light Touch Impaired Details  Impaired LUE    Proprioception  Impaired by gross assessment    Additional Comments  Pt could not detect light touch and localization       Coordination   Left 9 Hole Peg Test   placed 1 peg in 2 minutes    Box and Blocks  12      Praxis   Praxis  Impaired      Edema   Edema  mild Lt hand      AROM   Overall AROM Comments  RUE WNL's. LUE true flexion to 75*, can go higher into scaption ranges w/ compensation. ER 90%, IR 75%, Elbow distally WFL's however decreased control and occasional tremors. Pt has approx 90% full composite hand flexion      Hand Function   Right Hand Grip (lbs)  110 lbs    Left Hand Grip (lbs)  62 lbs                        OT Short Term Goals - 04/11/19 0903      OT SHORT TERM GOAL #1   Title  Pt independent with updated LUE HEP for neuro re-education - 05/12/19    Time  4    Period  Weeks    Status  New      OT SHORT TERM GOAL #2   Title  Pt independent with updated Lt hand    Time  4    Period  Weeks    Status  New      OT SHORT TERM GOAL #3   Title  Pt to verbalize understanding with safety considerations LUE d/t lack of sensation    Time  4    Period  Weeks    Status  New      OT SHORT TERM GOAL #4   Title  Pt to verbalize understanding of A/E for shoelaces and cooking    Time  4    Period  Weeks    Status  New      OT SHORT TERM GOAL #5   Title  Pt to consistently perform UE dressing (including jacket) and bathing Rt arm fully at mod I level    Baseline  min assist    Time  4    Period  Weeks    Status  New  OT SHORT TERM GOAL #6   Title  Pt to achieve 90* shoulder flexion LUE w/ min compensations and pain 3/10 or under    Time  4    Period  Weeks    Status  New        OT Long Term Goals - 04/11/19 [id number]      OT LONG TERM GOAL #1   Title  Pt to improve function LUE as evidenced by performing 18 on Box & Blocks test LUE - 06/11/19    Baseline  12    Time  8    Period  Weeks    Status  New      OT LONG TERM GOAL #2   Title  Pt to improve LUE coordination as evidenced by placing 9 pegs in pegboard in 2 min. or under    Baseline  only placed 1 peg in 2 min    Time  8    Period   Weeks    Status  New      OT LONG TERM GOAL #3   Title  Pt to consistently demo mid level reaching w/ min compensations to reduce pain LUE    Time  8    Period  Weeks    Status  New      OT LONG TERM GOAL #4   Title  Pt able to tie shoelaces with extra time prn    Time  8    Period  Weeks    Status  New            Plan - 04/11/19 0914    Clinical Impression Statement  Pt returns to outpatient O.T. services today for evaluation after pt had to abrubtly stop therapy due to covid restrictions in March 2020. 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 Pt continues to have decreased sensation LT side, decreased ROM and shoulder stiffness and pain, apraxia, decreased coordination and control LUE. Pt would benefit from further O.T. services to address these deficits fully since therapy was ended too soon.    OT Occupational Profile and History  Detailed Assessment- Review of Records and additional review of physical, cognitive, psychosocial history related to current functional performance    Occupational Profile and client history currently impacting functional performance  PMH; HTN, HLD, DM, A-fib. Current deficits impeding pt's ability to perform ADLS, IADLS, and role as husband, friend, and leisure participation    Occupational performance deficits (Please refer to evaluation for details):  ADL's;IADL's;Leisure    Body Structure / Function / Physical Skills  ADL;Decreased knowledge of precautions;ROM;UE functional use;FMC;Decreased knowledge of use of DME;Balance;Sensation;Endurance;Pain;Strength;Coordination;IADL;Proprioception;Tone;Mobility    Cognitive Skills  Attention;Safety Awareness;Memory    Rehab Potential  Good    Clinical Decision Making  Several treatment options, min-mod task modification necessary    Comorbidities Affecting Occupational Performance:  May have comorbidities impacting occupational performance    Modification or  Assistance to Complete Evaluation   Min-Moderate modification of tasks or assist with assess necessary to complete eval    OT Frequency  2x / week    OT Duration  8 weeks    OT Treatment/Interventions  Self-care/ADL training;Moist Heat;DME and/or AE instruction;Splinting;Therapeutic activities;Psychosocial skills training;Aquatic Therapy;Therapeutic exercise;Cognitive remediation/compensation;Coping strategies training;Neuromuscular education;Functional Mobility Training;Passive range of motion;Visual/perceptual remediation/compensation;Manual Therapy;Patient/family education;Electrical Stimulation    Plan  update HEP    Consulted and Agree with Plan of Care  Patient       Patient will benefit from  skilled therapeutic intervention in order to improve the following deficits and impairments:   Body Structure / Function / Physical Skills: ADL, Decreased knowledge of precautions, ROM, UE functional use, FMC, Decreased knowledge of use of DME, Balance, Sensation, Endurance, Pain, Strength, Coordination, IADL, Proprioception, Tone, Mobility Cognitive Skills: Attention, Safety Awareness, Memory     Visit Diagnosis: Hemiplegia and hemiparesis following other 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 Subacromial bursitis of left shoulder joint 09/10/2018  . Spastic hemiplegia affecting nondominant side ([place]) 07/07/2018  . Abnormality of gait 06/09/2018  . Neuropathic pain   . Labile blood pressure   . PAF (paroxysmal atrial fibrillation) ([place])   . Hemiparesis affecting left side as late effect of stroke ([place])   . Thalamic hemorrhage  ([place]) 03/17/2018  . Benign essential HTN   . Dyslipidemia   . Hemorrhagic stroke ([place])   . ICH (intracerebral hemorrhage) ([place]) 03/12/2018  . Prostate cancer ([place]) 01/29/2017  . 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 Diabetes ([place]) 12/14/2015  . PCP NOTES >>>>>>>>>>>>>>>>>>>>>>>>>>>>>>. 08/06/2015  . Dizziness and giddiness 01/29/2015  . Umbilical hernia [id number]  . Elevated PSA, less than 10 ng/ml 04/19/2012  . Annual physical exam 01/24/2011  . Hyperlipidemia 01/03/2010  . Essential hypertension 09/20/2007    Carey Bullocks, OTR/L 04/11/2019, 9:17 AM  [facility] [address] [place], [place], [address] Phone: [phone]   Fax:  [phone]  Name: AAHAAN JOSEY MRN: [id number] Date of Birth: Mar 03, 1946

## 2019-04-11 NOTE — Therapy (Signed)
[facility] [address] [place], [place], [address] Phone: [phone]   Fax:  [phone]  Physical Therapy Evaluation  Patient Details  Name: Marcus Beasley MRN: [id number] Date of Birth: [birth year] Referring Provider (PT): Posey Pronto   Encounter Date: 04/11/2019  PT End of Session - 04/11/19 1222    Visit Number  1    Number of Visits  17    Date for PT Re-Evaluation  07/10/19    Authorization Type  UHC Medicare; will need 10th visit progress note    PT Start Time  0717    PT Stop Time  0802    PT Time Calculation (min)  45 min    Activity Tolerance  Patient tolerated treatment well    Behavior During Therapy  [facility] for tasks assessed/performed       Past Medical History:  Diagnosis Date  . BPH (benign prostatic hyperplasia)    (-) Bx 2015  . Diabetes mellitus without complication ([place])   . Elevated PSA    Prostate Bx in 06/2013 was benign  . HTN (hypertension)   . Hyperlipidemia   . Macular degeneration, age related     Past Surgical History:  Procedure Laterality Date  . ABCESS DRAINAGE     abdomen- 26 day hospitalization 1968  . COLONOSCOPY  2016  . HERNIA REPAIR  summer '11   umbilical, dr Ninfa Linden   . POLYPECTOMY    . PROSTATE BIOPSY  06-2013 , 07-2017   (-), (-)    There were no vitals filed for this visit.   Subjective Assessment - 04/11/19 0721    Subjective  Have been trying to do the exercises, during the time of COVID-19.  Sometimes I try to walk room to room without the cane because I've forgotten it.  Have not had any falls.  I have unsteadiness with walking at times, but knee hasn't buckled at all.  I'm taking my time.    Patient Stated Goals  I'd like to be able towalk a little better.    Currently in Pain?  No/denies         [facility] PT Assessment - 04/11/19 0723      Assessment   Medical Diagnosis  Rt thalamic hemorrhage    Referring Provider (PT)  Posey Pronto    Onset Date/Surgical  Date  03/12/18   CVA; MD order 03/16/2019   Hand Dominance  Right    Prior Therapy  Had therapy in OP PT clinic until 08/2018, COVID restrictions      Precautions   Precautions  Fall      Balance Screen   Has the patient fallen in the past 6 months  No    Has the patient had a decrease in activity level because of a fear of falling?   No    Is the patient reluctant to leave their home because of a fear of falling?   No      Home Film/video editor residence    Living Arrangements  Spouse/significant other    Available Help at Discharge  Family    Type of [place] to enter    Entrance Stairs-Number of Steps  5    Entrance Stairs-Rails  Right;Left;Cannot reach both    [place]  Two level;Bed/bath upstairs    Home Equipment  Wheelchair - manual;Walker - 2 wheels;Cane - quad    Additional Comments  Has L AFO, wears occasionally, not all the time.      Prior Function   Level of Independence  Independent    Vocation  Retired    Leisure  Enjoys walking, Haematologist, cooking      Observation/Other Assessments   Focus on Therapeutic Outcomes (FOTO)   NA      Sensation   Light Touch  Impaired by gross assessment    Light Touch Impaired Details  Impaired LLE   to light touch-feels "heavy"     ROM / Strength   AROM / PROM / Strength  Strength      Strength   Overall Strength  Deficits    Strength Assessment Site  Hip;Knee;Ankle    Right/Left Hip  Right;Left    Right Hip Flexion  5/5    Left Hip Flexion  4/5    Right/Left Knee  Right;Left    Right Knee Flexion  5/5    Right Knee Extension  5/5    Left Knee Flexion  4/5    Left Knee Extension  4/5    Right/Left Ankle  Right;Left    Right Ankle Dorsiflexion  5/5    Left Ankle Dorsiflexion  4-/5    Left Ankle Plantar Flexion  4/5    Left Ankle Eversion  4/5      Transfers   Transfers  Sit to Stand;Stand to Sit    Sit to Stand  6: Modified independent (Device/Increase  time);Without upper extremity assist;From chair/3-in-1    Five time sit to stand comments   15.65    Stand to Sit  6: Modified independent (Device/Increase time);Without upper extremity assist;To chair/3-in-1      Ambulation/Gait   Ambulation/Gait  Yes    Ambulation/Gait Assistance  5: Supervision    Assistive device  Small based quad cane;None    Gait Pattern  Step-through pattern;Decreased arm swing - left;Decreased step length - left;Decreased stance time - left;Decreased dorsiflexion - left;Decreased weight shift to left;Trunk flexed    Ambulation Surface  Level;Indoor    Gait velocity  16.59 sec = 1.98 ft/sec   cane; 17.32 sec no device (1.89 ft/sec)   Stairs  Yes    Stairs Assistance  5: Supervision    Stair Management Technique  One rail Right;Alternating pattern;Forwards;Step to pattern    Number of Stairs  4   2 reps   Height of Stairs  6      Standardized Balance Assessment   Standardized Balance Assessment  Dynamic Gait Index;Timed Up and Go Test      Dynamic Gait Index   Level Surface  Mild Impairment    Change in Gait Speed  Mild Impairment    Gait with Horizontal Head Turns  Mild Impairment    Gait with Vertical Head Turns  Mild Impairment    Gait and Pivot Turn  Mild Impairment    Step Over Obstacle  Severe Impairment    Step Around Obstacles  Moderate Impairment    Steps  Moderate Impairment    Total Score  12    DGI comment:  Scores <19/24 indicate increased fall risk      Timed Up and Go Test   TUG  Normal TUG    Normal TUG (seconds)  19.47   cane   TUG Comments  Scores >13.5 seconds indicates increased fall risk.      High Level Balance   High Level Balance Comments  SLS 4.56 RLE:  0.81sec; tandem stance:  unable with LLE  posterior position; 8.84 sec RLE posterior position.                Objective measurements completed on examination: See above findings.                PT Short Term Goals - 04/11/19 1500      PT SHORT TERM  GOAL #1   Title  Pt will be independent with HEP for improved strength, balance, gait.  TARGET 4 weeks, 05/13/19 (may be modified due to delayed scheduling from eval)    Time  4    Period  Weeks    Status  New    Target Date  05/13/19      PT SHORT TERM GOAL #2   Title  Pt will improve DGI score to at least 15/24 for decreased fall risk.    Baseline  DGI 12/24 04/11/2019 eval    Time  4    Period  Weeks    Status  New      PT SHORT TERM GOAL #3   Title  Pt will improve 5x sit<>stand to less than or equal to 13 seconds for improved transfer efficiency and functional strength.    Time  4    Period  Weeks    Status  New      PT SHORT TERM GOAL #4   Title  Pt will improve TUG score to less than or equal to 15 seconds for decreased fall risk.    Time  4    Period  Weeks    Status  New        PT Long Term Goals - 04/11/19 1504      PT LONG TERM GOAL #1   Title  Pt will verbalize plans for progress of HPE, including continued community fitness upon d/c from PT.  TARGET 8 weeks, 06/10/2019 (may be delayed due to delayed scheduling from eval)    Time  8    Period  Weeks    Status  New      PT LONG TERM GOAL #2   Title  Pt will improve DGI to at least 19/24 for decreased fall risk.    Time  8    Period  Weeks    Status  New      PT LONG TERM GOAL #3   Title  Pt will improve gait velocity score to at least 2.3 ft/sec for improved gait efficiency and safety.    Time  8    Period  Weeks    Status  New      PT LONG TERM GOAL #4   Title  Pt will improve TUG score to less than or equal to 13.5 seconds for decreased fall risk.    Time  8    Period  Weeks    Status  New      PT LONG TERM GOAL #5   Title  Pt will ambulate at least 1000 ft using cane/no device, modified independently, for improved independence with gait on indoor and outdoor surfaces    Time  8    Period  Weeks    Status  New      PT LONG TERM GOAL #6   Title  Pt will negotiate at least 12 steps, one  handrail, step-through pattern, modified independently, for improved stair negotiation in home.    Time  8    Period  Weeks    Status  New  Plan - 04/11/19 1223    Clinical Impression Statement  Pt is a 73 year old male who presents to OPPT with history of CVA 02/2018.  He was seen in this clinic earlier this year, but has not been seen by PT since March 2020 due to COVID-19 restrictions.  He returns to [place] today with decreased LLE strength, decreased dynamic balance, decreased independence with gait, decreased functional lower extremity strength.  He is at fall risk per TUG and DGI scores; his gait velocity falls into limited community ambulator range.  He is currently ambulating at home with [facility] or no device; he may or may not use AFO.  Since d/c from PT, he has improved gait velocity slightly.  Pt will benefit from return to PT to address the above stated deficits for decreased fall risk, improved overall mobility and independence.    Personal Factors and Comorbidities  Comorbidity 3+    Comorbidities  See PMH    Examination-Activity Limitations  Stairs;Locomotion Level;Transfers    Examination-Participation Restrictions  Community Activity;Yard Work;Meal Prep    Stability/Clinical Decision Making  Evolving/Moderate complexity    Clinical Decision Making  Moderate    Rehab Potential  Good    Clinical Impairments Affecting Rehab Potential  good family support; independnet prior to CVA    PT Frequency  2x / week    PT Duration  8 weeks   plus eval   PT Treatment/Interventions  ADLs/Self Care Home Management;Electrical Stimulation;Therapeutic exercise;Therapeutic activities;Functional mobility training;Gait training;Stair training;DME Instruction;Balance training;Neuromuscular re-education;Patient/family education;Orthotic Fit/Training;Manual techniques;Aquatic Therapy    PT Next Visit Plan  Gait training with no AFO, ankle plantarflexion/dorsiflexion strengthening; gastroc  stretching; review HEP and update to standing NMR and strengthening, gait training exercises    Consulted and Agree with Plan of Care  Patient       Patient will benefit from skilled therapeutic intervention in order to improve the following deficits and impairments:  Abnormal gait, Decreased activity tolerance, Decreased balance, Decreased mobility, Difficulty walking, Decreased strength, Impaired flexibility, Impaired tone  Visit Diagnosis: Unsteadiness on feet  Other abnormalities of gait and mobility  Muscle weakness (generalized)     Problem List Patient Active Problem List   Diagnosis Date Noted  . Subacromial bursitis of left shoulder joint 09/10/2018  . Spastic hemiplegia affecting nondominant side ([place]) 07/07/2018  . Abnormality of gait 06/09/2018  . Neuropathic pain   . Labile blood pressure   . PAF (paroxysmal atrial fibrillation) ([place])   . Hemiparesis affecting left side as late effect of stroke ([place])   . Thalamic hemorrhage ([place]) 03/17/2018  . Benign essential HTN   . Dyslipidemia   . Hemorrhagic stroke ([place])   . ICH (intracerebral hemorrhage) ([place]) 03/12/2018  . Prostate cancer ([place]) 01/29/2017  . 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 Diabetes ([place]) 12/14/2015  . PCP NOTES >>>>>>>>>>>>>>>>>>>>>>>>>>>>>>. 08/06/2015  . Dizziness and giddiness 01/29/2015  . Umbilical hernia [id number]  . Elevated PSA, less than 10 ng/ml 04/19/2012  . Annual physical exam 01/24/2011  . Hyperlipidemia 01/03/2010  . Essential hypertension 09/20/2007    Magdaline Zollars W. 04/11/2019, 3:08 PM Frazier Butt., PT  [facility] [address] [place], [place], [address] Phone: [phone]   Fax:  [phone]  Name: Marcus Beasley MRN: [id number] Date of Birth: Oct 26, 1945

## 2019-05-03 ENCOUNTER — Encounter: Payer: Self-pay | Admitting: Physical Therapy

## 2019-05-03 ENCOUNTER — Ambulatory Visit: Payer: Medicare Other | Attending: Physical Medicine & Rehabilitation | Admitting: Occupational Therapy

## 2019-05-03 ENCOUNTER — Other Ambulatory Visit: Payer: Self-pay

## 2019-05-03 ENCOUNTER — Ambulatory Visit: Payer: Medicare Other | Admitting: Physical Therapy

## 2019-05-03 DIAGNOSIS — R208 Other disturbances of skin sensation: Secondary | ICD-10-CM

## 2019-05-03 DIAGNOSIS — R2681 Unsteadiness on feet: Secondary | ICD-10-CM

## 2019-05-03 DIAGNOSIS — M6281 Muscle weakness (generalized): Secondary | ICD-10-CM | POA: Diagnosis not present

## 2019-05-03 DIAGNOSIS — I69118 Other symptoms and signs involving cognitive functions following nontraumatic intracerebral hemorrhage: Secondary | ICD-10-CM | POA: Diagnosis not present

## 2019-05-03 DIAGNOSIS — I69254 Hemiplegia and hemiparesis following other nontraumatic intracranial hemorrhage affecting left non-dominant side: Secondary | ICD-10-CM

## 2019-05-03 DIAGNOSIS — R2689 Other abnormalities of gait and mobility: Secondary | ICD-10-CM | POA: Diagnosis not present

## 2019-05-03 DIAGNOSIS — M25512 Pain in left shoulder: Secondary | ICD-10-CM | POA: Insufficient documentation

## 2019-05-03 DIAGNOSIS — R209 Unspecified disturbances of skin sensation: Secondary | ICD-10-CM | POA: Diagnosis not present

## 2019-05-03 DIAGNOSIS — R278 Other lack of coordination: Secondary | ICD-10-CM

## 2019-05-03 DIAGNOSIS — G8929 Other chronic pain: Secondary | ICD-10-CM | POA: Diagnosis not present

## 2019-05-03 NOTE — Therapy (Signed)
[facility] [name] [place], [place], [address] Phone: [phone]   Fax:  [phone]  Occupational Therapy Treatment  Patient Details  Name: Marcus Beasley MRN: [id number] Date of Birth: 09/28/1945 Referring Provider (OT): Delice Lesch   Encounter Date: 05/03/2019  OT End of Session - 05/03/19 0929    Visit Number  2    Number of Visits  17    Date for OT Re-Evaluation  06/11/19    Authorization Type  U[employer] MCR    Authorization - Visit Number  2    Authorization - Number of Visits  10    OT Start Time  0845    OT Stop Time  0930    OT Time Calculation (min)  45 min    Activity Tolerance  Patient tolerated treatment well    Behavior During Therapy  [facility] for tasks assessed/performed       Past Medical History:  Diagnosis Date  . BPH (benign prostatic hyperplasia)    (-) Bx 2015  . Diabetes mellitus without complication ([place])   . Elevated PSA    Prostate Bx in 06/2013 was benign  . HTN (hypertension)   . Hyperlipidemia   . Macular degeneration, age related     Past Surgical History:  Procedure Laterality Date  . ABCESS DRAINAGE     abdomen- 26 day hospitalization 1968  . COLONOSCOPY  2016  . HERNIA REPAIR  summer '11   umbilical, dr Ninfa Linden   . POLYPECTOMY    . PROSTATE BIOPSY  06-2013 , 07-2017   (-), (-)    There were no vitals filed for this visit.  Subjective Assessment - 05/03/19 0851    Subjective   My shoulder hurts if I go higher    Pertinent History  Rt thalamic hemorrhage 03/12/18. PMH: HTN, HLD, DM, A-fib    Limitations  Fall risk    Patient Stated Goals  get more function in my Lt arm and hand    Currently in Pain?  Yes    Pain Score  6     Pain Location  Shoulder    Pain Orientation  Left    Pain Descriptors / Indicators  Aching    Pain Type  Chronic pain    Pain Onset  More than a month ago    Pain Frequency  Intermittent    Aggravating Factors   only when going past midrange     Pain Relieving Factors  rest       Pt issued HEP for neuro re-educ and coordination  w/ focus on slower movement and correct movement with each exercise: Pt performing table slides bilaterally w/ cues to use LUE w/ RUE to mimic correct movement; supine for self ROM stretch w/ modifications provided due to shoulder pain (pt also cued to move slower and envision correct scapula movement to prevent scapula elevation/sh hiking); flipping large cards over focusing on supination and all fingers on card for finger ext; and picking up checkers b/t first 2 fingers and stacking for control/coordination (pt trying to use thumb and long finger).   Also discussed using Lt hand functionally for simple, SAFE tasks and when NOT to use Lt hand                    OT Education - 05/03/19 0941    Education Details  Initial HEP    Person(s) Educated  Patient    Methods  Explanation;Demonstration;Verbal cues;Handout  Comprehension  Verbalized understanding;Returned demonstration;Verbal cues required;Need further instruction       OT Short Term Goals - 05/03/19 0946      OT SHORT TERM GOAL #1   Title  Pt independent with updated LUE HEP for neuro re-education - 05/12/19    Time  4    Period  Weeks    Status  On-going      OT SHORT TERM GOAL #2   Title  Pt independent with updated Lt hand    Time  4    Period  Weeks    Status  New      OT SHORT TERM GOAL #3   Title  Pt to verbalize understanding with safety considerations LUE d/t lack of sensation    Time  4    Period  Weeks    Status  New      OT SHORT TERM GOAL #4   Title  Pt to verbalize understanding of A/E for shoelaces and cooking    Time  4    Period  Weeks    Status  New      OT SHORT TERM GOAL #5   Title  Pt to consistently perform UE dressing (including jacket) and bathing Rt arm fully at mod I level    Baseline  min assist    Time  4    Period  Weeks    Status  New      OT SHORT TERM GOAL #6   Title  Pt to  achieve 90* shoulder flexion LUE w/ min compensations and pain 3/10 or under    Time  4    Period  Weeks    Status  New        OT Long Term Goals - 04/11/19 [id number]      OT LONG TERM GOAL #1   Title  Pt to improve function LUE as evidenced by performing 18 on [employer] test LUE - 06/11/19    Baseline  12    Time  8    Period  Weeks    Status  New      OT LONG TERM GOAL #2   Title  Pt to improve LUE coordination as evidenced by placing 9 pegs in pegboard in 2 min. or under    Baseline  only placed 1 peg in 2 min    Time  8    Period  Weeks    Status  New      OT LONG TERM GOAL #3   Title  Pt to consistently demo mid level reaching w/ min compensations to reduce pain LUE    Time  8    Period  Weeks    Status  New      OT LONG TERM GOAL #4   Title  Pt able to tie shoelaces with extra time prn    Time  8    Period  Weeks    Status  New            Plan - 05/03/19 0947    Clinical Impression Statement  Pt appears to have worsening Lt shoulder compensations since last seen in March, which is contributing to pain Lt shoulder. Pt also with continued altered sensation and apraxia. Pt did improve w/ cues to move slower and focus on correct positioning    Occupational Profile and client history currently impacting functional performance  PMH; HTN, HLD, DM, A-fib. Current deficits impeding pt's ability to perform  ADLS, IADLS, and role as husband, friend, and leisure participation    Occupational performance deficits (Please refer to evaluation for details):  ADL's;IADL's;Leisure    Body Structure / Function / Physical Skills  ADL;Decreased knowledge of precautions;ROM;UE functional use;FMC;Decreased knowledge of use of DME;Balance;Sensation;Endurance;Pain;Strength;Coordination;IADL;Proprioception;Tone;Mobility    Cognitive Skills  Attention;Safety Awareness;Memory    Rehab Potential  Good    OT Frequency  2x / week    OT Duration  8 weeks    OT Treatment/Interventions   Self-care/ADL training;Moist Heat;DME and/or AE instruction;Splinting;Therapeutic activities;Psychosocial skills training;Aquatic Therapy;Therapeutic exercise;Cognitive remediation/compensation;Coping strategies training;Neuromuscular education;Functional Mobility Training;Passive range of motion;Visual/perceptual remediation/compensation;Manual Therapy;Patient/family education;Electrical Stimulation    Plan  review HEP, continue neuro re-education and low range reaching LUE, Pt needs some tactile assist for scapula    Consulted and Agree with Plan of Care  Patient       Patient will benefit from skilled therapeutic intervention in order to improve the following deficits and impairments:   Body Structure / Function / Physical Skills: ADL, Decreased knowledge of precautions, ROM, UE functional use, FMC, Decreased knowledge of use of DME, Balance, Sensation, Endurance, Pain, Strength, Coordination, IADL, Proprioception, Tone, Mobility Cognitive Skills: Attention, Safety Awareness, Memory     Visit Diagnosis: 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 Subacromial bursitis of left shoulder joint 09/10/2018  . Spastic hemiplegia affecting nondominant side ([place]) 07/07/2018  . Abnormality of gait 06/09/2018  . Neuropathic pain   . Labile blood pressure   . PAF (paroxysmal atrial fibrillation) ([place])   . Hemiparesis affecting left side as late effect of stroke ([place])   . 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 Benign essential HTN   . Dyslipidemia   . Hemorrhagic stroke ([place])   . ICH (intracerebral hemorrhage) ([place]) 03/12/2018  . Prostate cancer ([place]) 01/29/2017  . 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 Diabetes ([place]) 12/14/2015  . PCP NOTES  >>>>>>>>>>>>>>>>>>>>>>>>>>>>>>. 08/06/2015  . Dizziness and giddiness 01/29/2015  . Umbilical hernia [id number]  . Elevated PSA, less than 10 ng/ml 04/19/2012  . Annual physical exam 01/24/2011  . Hyperlipidemia 01/03/2010  . Essential hypertension 09/20/2007    Carey Bullocks, OTR/L 05/03/2019, 9:53 AM  [facility] [address] [place], [place], [address] Phone: [phone]   Fax:  [phone]  Name: ADIYAN STOGDILL MRN: [id number] Date of Birth: 1946-04-20

## 2019-05-03 NOTE — Patient Instructions (Signed)
   Lt arm exercises:   1) Laying down, hold Lt wrist with Rt arm (thumb side up) stretch up towards ceiling like a chest press, then gently raise overhead slowly, then come back to ceiling, then bend arms to lower. Perform SLOWLY x 10 reps  2) Table slides with both arms on folded hand towel - forwards and back x 10, then big circles each way  3) Flip large cards over Lt hand turning palm up. Make sure all fingers are on card. Go through entire deck (can use flash cards if you can't find larger cards)  4) Pick up checkers b/t thumb and index finger and stack in plies of 3 (at least 5 stacks).   Can also place checkers into Connect 4 or in cup if too hard  Use Lt hand/arm for lower range functional tasks including:   Opening drawers and lower cabinets Putting away light items Helping wash dishes Wiping down table Assisting in bathing and dressing Folding clothes  Do NOT use Lt hand/arm for anything: sharp, hot, breakable, or heavy

## 2019-05-03 NOTE — Therapy (Signed)
[facility] [address] [place], [place], [address] Phone: [phone]   Fax:  [phone]  Physical Therapy Treatment  Patient Details  Name: Marcus Beasley MRN: [id number] Date of Birth: [birth year] Referring Provider (PT): Posey Pronto   Encounter Date: 05/03/2019  PT End of Session - 05/03/19 0935    Visit Number  2    Number of Visits  17    Date for PT Re-Evaluation  07/10/19    Authorization Type  UHC Medicare; will need 10th visit progress note    PT Start Time  0930    PT Stop Time  1015    PT Time Calculation (min)  45 min    Equipment Utilized During Treatment  Gait belt    Activity Tolerance  Patient tolerated treatment well;No increased pain    Behavior During Therapy  WFL for tasks assessed/performed       Past Medical History:  Diagnosis Date  . BPH (benign prostatic hyperplasia)    (-) Bx 2015  . Diabetes mellitus without complication ([place])   . Elevated PSA    Prostate Bx in 06/2013 was benign  . HTN (hypertension)   . Hyperlipidemia   . Macular degeneration, age related     Past Surgical History:  Procedure Laterality Date  . ABCESS DRAINAGE     abdomen- 26 day hospitalization 1968  . COLONOSCOPY  2016  . HERNIA REPAIR  summer '11   umbilical, dr Ninfa Linden   . POLYPECTOMY    . PROSTATE BIOPSY  06-2013 , 07-2017   (-), (-)    There were no vitals filed for this visit.  Subjective Assessment - 05/03/19 0932    Subjective  No new complaints. No falls. Some pain in left shoulder with shoulder flexion (OT addressing shoulder pain).    Patient Stated Goals  I'd like to be able towalk a little better.    Currently in Pain?  Yes    Pain Score  6    with flexion; 0/10 at rest   Pain Location  Shoulder    Pain Orientation  Left    Pain Descriptors / Indicators  Heaviness;Tingling;Other (Comment)   stiff, heavy feeling with tingling down the fingers.   Pain Type  Acute pain;Chronic pain    Pain  Onset  More than a month ago    Pain Frequency  Intermittent    Aggravating Factors   when raising arm up to a certain range    Pain Relieving Factors  rest, not raising arm up            [facility] Adult PT Treatment/Exercise - 05/03/19 0937      Transfers   Transfers  Sit to Stand;Stand to Sit    Sit to Stand  6: Modified independent (Device/Increase time);Without upper extremity assist;From chair/3-in-1    Stand to Sit  6: Modified independent (Device/Increase time);Without upper extremity assist;To chair/3-in-1      Ambulation/Gait   Ambulation/Gait  Yes    Ambulation/Gait Assistance  5: Supervision    Ambulation Distance (Feet)  --   around gym with session.   Assistive device  Small based quad cane    Gait Pattern  Step-through pattern;Decreased arm swing - left;Decreased step length - left;Decreased stance time - left;Decreased dorsiflexion - left;Decreased weight shift to left;Trunk flexed    Ambulation Surface  Level;Indoor    Stairs  Yes    Stairs Assistance  5: Supervision    Stairs Assistance Details (indicate  cue type and reason)  with rail on left when facing up the steps: had pt prop forarm/hand on rail (vs holding it with left hand due to pain/decreased grip strength reported by pt/OT with this) and cane on right side to ascend with step to pattern. Then the pt was able hold rail with right UE and used weaker left LE to lower cane down to next step with step to pattern. pt reported feeling more stable his way and will try it at home.     Stair Management Technique  One rail Left;Step to pattern;Forwards;With cane    Number of Stairs  4   x2 reps     Neuro Re-ed    Neuro Re-ed Details   reviewed pt's HEP from prior episode of care and advanced program with remainder of session Refer to [facility] for full details. Min guard assist for balance ex's. Cues on form and technique needed.       Issued the following to pt's HEP today. Access Code: [email or web address]  URL:  [email or web address]  Date: 05/03/2019  Prepared by: Willow Ora   Exercises Sit to/from Stand in Stride position - 10 reps - 1 sets - 1x daily - 5x weekly Heel Toe Raises with Counter Support - 10 reps - 1 sets - 1x daily - 5x weekly Walking March - 3 reps - 1 sets - 1x daily - 5x weekly Tandem Walking with Counter Support - 3 reps - 1 sets - 1x daily - 5x weekly Standing Gastroc Stretch at Counter - 3 reps - 1 sets - 30 hold - 1x daily - 5x weekly Wide Stance with Eyes Closed on Foam Pad - 3 reps - 1 sets - 30 hold - 1x daily - 5x weekly Wide Stance with Eyes Closed and Head Rotation on Foam Pad - 10 reps - 1 sets - 1x daily - 5x weekly Wide Stance with Eyes Closed and Head Nods on Foam Pad - 10 reps - 1 sets - 1x daily - 5x weekly      PT Education - 05/03/19 2024    Education Details  updated/advanced HEP    Person(s) Educated  Patient    Methods  Explanation;Demonstration;Verbal cues    Comprehension  Verbalized understanding;Returned demonstration;Need further instruction       PT Short Term Goals - 04/11/19 1500      PT SHORT TERM GOAL #1   Title  Pt will be independent with HEP for improved strength, balance, gait.  TARGET 4 weeks, 05/13/19 (may be modified due to delayed scheduling from eval)    Time  4    Period  Weeks    Status  New    Target Date  05/13/19      PT SHORT TERM GOAL #2   Title  Pt will improve DGI score to at least 15/24 for decreased fall risk.    Baseline  DGI 12/24 04/11/2019 eval    Time  4    Period  Weeks    Status  New      PT SHORT TERM GOAL #3   Title  Pt will improve 5x sit<>stand to less than or equal to 13 seconds for improved transfer efficiency and functional strength.    Time  4    Period  Weeks    Status  New      PT SHORT TERM GOAL #4   Title  Pt will improve TUG score to less than or equal to 15 seconds for decreased  fall risk.    Time  4    Period  Weeks    Status  New        PT Long Term Goals -  04/11/19 1504      PT LONG TERM GOAL #1   Title  Pt will verbalize plans for progress of HPE, including continued community fitness upon d/c from PT.  TARGET 8 weeks, 06/10/2019 (may be delayed due to delayed scheduling from eval)    Time  8    Period  Weeks    Status  New      PT LONG TERM GOAL #2   Title  Pt will improve DGI to at least 19/24 for decreased fall risk.    Time  8    Period  Weeks    Status  New      PT LONG TERM GOAL #3   Title  Pt will improve gait velocity score to at least 2.3 ft/sec for improved gait efficiency and safety.    Time  8    Period  Weeks    Status  New      PT LONG TERM GOAL #4   Title  Pt will improve TUG score to less than or equal to 13.5 seconds for decreased fall risk.    Time  8    Period  Weeks    Status  New      PT LONG TERM GOAL #5   Title  Pt will ambulate at least 1000 ft using cane/no device, modified independently, for improved independence with gait on indoor and outdoor surfaces    Time  8    Period  Weeks    Status  New      PT LONG TERM GOAL #6   Title  Pt will negotiate at least 12 steps, one handrail, step-through pattern, modified independently, for improved stair negotiation in home.    Time  8    Period  Weeks    Status  New            Plan - 05/03/19 0935    Clinical Impression Statement  Today's skilled session focused on stair negotiation for getting to/from second floor of pt's home with pt reporting technique performed today being easier to do. Remainder of session focused on updates/advancement of pt's HEP to address strengthening and activity tolerance. The pt is progressing toward goals and should benefit from continued PT to progress toward unmet goals.    Personal Factors and Comorbidities  Comorbidity 3+    Comorbidities  See PMH    Examination-Activity Limitations  Stairs;Locomotion Level;Transfers    Examination-Participation Restrictions  Community Activity;Yard Work;Meal Prep     Stability/Clinical Decision Making  Evolving/Moderate complexity    Rehab Potential  Good    Clinical Impairments Affecting Rehab Potential  good family support; independnet prior to CVA    PT Frequency  2x / week    PT Duration  8 weeks   plus eval   PT Treatment/Interventions  ADLs/Self Care Home Management;Electrical Stimulation;Therapeutic exercise;Therapeutic activities;Functional mobility training;Gait training;Stair training;DME Instruction;Balance training;Neuromuscular re-education;Patient/family education;Orthotic Fit/Training;Manual techniques;Aquatic Therapy    PT Next Visit Plan  Gait training with no AFO, ankle plantarflexion/dorsiflexion strengthening; gastroc stretching; gait training exercises    PT Home Exercise Plan  Access Code: [email or web address]    Consulted and Agree with Plan of Care  Patient       Patient will benefit from skilled therapeutic intervention in order to improve the following deficits and  impairments:  Abnormal gait, Decreased activity tolerance, Decreased balance, Decreased mobility, Difficulty walking, Decreased strength, Impaired flexibility, Impaired tone  Visit Diagnosis: Unsteadiness on feet  Other abnormalities of gait and mobility  Muscle weakness (generalized)     Problem List Patient Active Problem List   Diagnosis Date Noted  . Subacromial bursitis of left shoulder joint 09/10/2018  . Spastic hemiplegia affecting nondominant side ([place]) 07/07/2018  . Abnormality of gait 06/09/2018  . Neuropathic pain   . Labile blood pressure   . PAF (paroxysmal atrial fibrillation) ([place])   . Hemiparesis affecting left side as late effect of stroke ([place])   . 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 Benign essential HTN   . Dyslipidemia   . Hemorrhagic stroke ([place])   . ICH (intracerebral hemorrhage) ([place]) 03/12/2018  . Prostate cancer ([place]) 01/29/2017  . 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 Diabetes ([place]) 12/14/2015  . PCP NOTES  >>>>>>>>>>>>>>>>>>>>>>>>>>>>>>. 08/06/2015  . Dizziness and giddiness 01/29/2015  . Umbilical hernia [id number]  . Elevated PSA, less than 10 ng/ml 04/19/2012  . Annual physical exam 01/24/2011  . Hyperlipidemia 01/03/2010  . Essential hypertension 09/20/2007    Willow Ora, PTA, [facility] [address], [place], [place] [address] [phone] 05/03/19, 8:25 PM   Name: Marcus Beasley MRN: [id number] Date of Birth: 1945/10/21

## 2019-05-03 NOTE — Patient Instructions (Signed)
Access Code: [email or web address]  URL: [email or web address]  Date: 05/03/2019  Prepared by: Willow Ora   Exercises Sit to/from Stand in Stride position - 10 reps - 1 sets - 1x daily - 5x weekly Heel Toe Raises with Counter Support - 10 reps - 1 sets - 1x daily - 5x weekly Walking March - 3 reps - 1 sets - 1x daily - 5x weekly Tandem Walking with Counter Support - 3 reps - 1 sets - 1x daily - 5x weekly Standing Gastroc Stretch at Counter - 3 reps - 1 sets - 30 hold - 1x daily - 5x weekly Wide Stance with Eyes Closed on Foam Pad - 3 reps - 1 sets - 30 hold - 1x daily - 5x weekly Wide Stance with Eyes Closed and Head Rotation on Foam Pad - 10 reps - 1 sets - 1x daily - 5x weekly Wide Stance with Eyes Closed and Head Nods on Foam Pad - 10 reps - 1 sets - 1x daily - 5x weekly

## 2019-05-05 ENCOUNTER — Encounter: Payer: Self-pay | Admitting: Physical Therapy

## 2019-05-05 ENCOUNTER — Ambulatory Visit: Payer: Medicare Other | Admitting: Occupational Therapy

## 2019-05-05 ENCOUNTER — Other Ambulatory Visit: Payer: Self-pay

## 2019-05-05 ENCOUNTER — Ambulatory Visit: Payer: Medicare Other | Admitting: Physical Therapy

## 2019-05-05 DIAGNOSIS — R2681 Unsteadiness on feet: Secondary | ICD-10-CM | POA: Diagnosis not present

## 2019-05-05 DIAGNOSIS — M6281 Muscle weakness (generalized): Secondary | ICD-10-CM

## 2019-05-05 DIAGNOSIS — I69254 Hemiplegia and hemiparesis following other nontraumatic intracranial hemorrhage affecting left non-dominant side: Secondary | ICD-10-CM

## 2019-05-05 DIAGNOSIS — R2689 Other abnormalities of gait and mobility: Secondary | ICD-10-CM

## 2019-05-05 DIAGNOSIS — M25512 Pain in left shoulder: Secondary | ICD-10-CM | POA: Diagnosis not present

## 2019-05-05 DIAGNOSIS — G8929 Other chronic pain: Secondary | ICD-10-CM | POA: Diagnosis not present

## 2019-05-05 DIAGNOSIS — R278 Other lack of coordination: Secondary | ICD-10-CM

## 2019-05-05 DIAGNOSIS — I69118 Other symptoms and signs involving cognitive functions following nontraumatic intracerebral hemorrhage: Secondary | ICD-10-CM | POA: Diagnosis not present

## 2019-05-05 DIAGNOSIS — R208 Other disturbances of skin sensation: Secondary | ICD-10-CM

## 2019-05-05 DIAGNOSIS — R209 Unspecified disturbances of skin sensation: Secondary | ICD-10-CM | POA: Diagnosis not present

## 2019-05-05 NOTE — Therapy (Signed)
[facility] [address] [place], [place], [address] Phone: [phone]   Fax:  [phone]  Physical Therapy Treatment  Patient Details  Name: Marcus Beasley MRN: [id number] Date of Birth: 05-06-1946 Referring Provider (PT): Posey Pronto   Encounter Date: 05/05/2019  PT End of Session - 05/05/19 0851    Visit Number  3    Number of Visits  17    Date for PT Re-Evaluation  07/10/19    Authorization Type  [employer] Medicare; will need 10th visit progress note    PT Start Time  0847    PT Stop Time  0928    PT Time Calculation (min)  41 min    Equipment Utilized During Treatment  Gait belt    Activity Tolerance  Patient tolerated treatment well;No increased pain    Behavior During Therapy  WFL for tasks assessed/performed       Past Medical History:  Diagnosis Date  . BPH (benign prostatic hyperplasia)    (-) Bx 2015  . Diabetes mellitus without complication ([place])   . Elevated PSA    Prostate Bx in 06/2013 was benign  . HTN (hypertension)   . Hyperlipidemia   . Macular degeneration, age related     Past Surgical History:  Procedure Laterality Date  . ABCESS DRAINAGE     abdomen- 26 day hospitalization 1968  . COLONOSCOPY  2016  . HERNIA REPAIR  summer '11   umbilical, dr Ninfa Linden   . POLYPECTOMY    . PROSTATE BIOPSY  06-2013 , 07-2017   (-), (-)    There were no vitals filed for this visit.  Subjective Assessment - 05/05/19 0850    Subjective  No new complaints. No falls. Only has left shoulder pain with certain movements, none at rest. Does complain of shoulder being stiff.    Patient Stated Goals  I'd like to be able towalk a little better.    Currently in Pain?  No/denies    Pain Score  0-No pain            [facility] Adult PT Treatment/Exercise - 05/05/19 0852      Transfers   Transfers  Sit to Stand;Stand to Sit    Sit to Stand  6: Modified independent (Device/Increase time);Without upper extremity  assist;From chair/3-in-1    Stand to Sit  6: Modified independent (Device/Increase time);Without upper extremity assist;To chair/3-in-1      Ambulation/Gait   Ambulation/Gait  Yes    Ambulation/Gait Assistance  5: Supervision;4: Min guard    Ambulation/Gait Assistance Details  gait in session no AD/no brace with cues for heel strike at intial contact and to come off toes, pt with tendency to keep left knee flexed with a "stomp like" step unless cued otherwise.    Ambulation Distance (Feet)  115 Feet   x2   Assistive device  Small based quad cane;None    Gait Pattern  Step-through pattern;Decreased arm swing - left;Decreased step length - left;Decreased stance time - left;Decreased dorsiflexion - left;Decreased weight shift to left;Trunk flexed    Ambulation Surface  Level;Indoor      High Level Balance   High Level Balance Activities  Side stepping;Marching forwards;Marching backwards;Tandem walking   tandem/toe/heel walking fwd/bwd   High Level Balance Comments  on blue mat in parallel bars: 3-4 laps each with no UE support to light UE support, cues on form on posture.           Balance Exercises -  05/05/19 0909      Balance Exercises: Standing   Rockerboard  Anterior/posterior;Lateral;Head turns;EO;EC;30 seconds;10 reps      Balance Exercises: Standing   Rebounder Limitations  performed both ways on balance board with no UE support: rocking the board with emphasis on tall posture with EO, progressing to EC; holding the board steady for EC no head movements, progressing to EC head movements left<>right, up<>down. intermittent touch to bars for balance with min guard to min assist for balance. cues on posture and weight shifting to assist with balance.            PT Short Term Goals - 04/11/19 1500      PT SHORT TERM GOAL #1   Title  Pt will be independent with HEP for improved strength, balance, gait.  TARGET 4 weeks, 05/13/19 (may be modified due to delayed scheduling from  eval)    Time  4    Period  Weeks    Status  New    Target Date  05/13/19      PT SHORT TERM GOAL #2   Title  Pt will improve DGI score to at least 15/24 for decreased fall risk.    Baseline  DGI 12/24 04/11/2019 eval    Time  4    Period  Weeks    Status  New      PT SHORT TERM GOAL #3   Title  Pt will improve 5x sit<>stand to less than or equal to 13 seconds for improved transfer efficiency and functional strength.    Time  4    Period  Weeks    Status  New      PT SHORT TERM GOAL #4   Title  Pt will improve TUG score to less than or equal to 15 seconds for decreased fall risk.    Time  4    Period  Weeks    Status  New        PT Long Term Goals - 04/11/19 1504      PT LONG TERM GOAL #1   Title  Pt will verbalize plans for progress of HPE, including continued community fitness upon d/c from PT.  TARGET 8 weeks, 06/10/2019 (may be delayed due to delayed scheduling from eval)    Time  8    Period  Weeks    Status  New      PT LONG TERM GOAL #2   Title  Pt will improve DGI to at least 19/24 for decreased fall risk.    Time  8    Period  Weeks    Status  New      PT LONG TERM GOAL #3   Title  Pt will improve gait velocity score to at least 2.3 ft/sec for improved gait efficiency and safety.    Time  8    Period  Weeks    Status  New      PT LONG TERM GOAL #4   Title  Pt will improve TUG score to less than or equal to 13.5 seconds for decreased fall risk.    Time  8    Period  Weeks    Status  New      PT LONG TERM GOAL #5   Title  Pt will ambulate at least 1000 ft using cane/no device, modified independently, for improved independence with gait on indoor and outdoor surfaces    Time  8    Period  Weeks  Status  New      PT LONG TERM GOAL #6   Title  Pt will negotiate at least 12 steps, one handrail, step-through pattern, modified independently, for improved stair negotiation in home.    Time  8    Period  Weeks    Status  New            Plan -  05/05/19 0851    Clinical Impression Statement  Today's skilled session continued to focus on gait with no AD/brace and balance reactions with brief rest breaks needed due to fatigue. The pt is progressing toward goals and should benefit from continued PT to progress toward unmet goals.    Personal Factors and Comorbidities  Comorbidity 3+    Comorbidities  See PMH    Examination-Activity Limitations  Stairs;Locomotion Level;Transfers    Examination-Participation Restrictions  Community Activity;Yard Work;Meal Prep    Stability/Clinical Decision Making  Evolving/Moderate complexity    Rehab Potential  Good    Clinical Impairments Affecting Rehab Potential  good family support; independnet prior to CVA    PT Frequency  2x / week    PT Duration  8 weeks   plus eval   PT Treatment/Interventions  ADLs/Self Care Home Management;Electrical Stimulation;Therapeutic exercise;Therapeutic activities;Functional mobility training;Gait training;Stair training;DME Instruction;Balance training;Neuromuscular re-education;Patient/family education;Orthotic Fit/Training;Manual techniques;Aquatic Therapy    PT Next Visit Plan  Gait training with no AFO/AD, ankle plantarflexion/dorsiflexion strengthening; gastroc stretching; gait training exercises; continue to work on balance reactions.    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 Subacromial bursitis of left shoulder joint 09/10/2018  . Spastic hemiplegia affecting nondominant side ([place]) 07/07/2018  . Abnormality of gait 06/09/2018  . Neuropathic pain   . Labile blood pressure   . PAF (paroxysmal atrial fibrillation) ([place] )   . Hemiparesis affecting left side as late effect of stroke ([place])   . Thalamic hemorrhage ([place]) 03/17/2018  . Benign essential HTN   . Dyslipidemia   . Hemorrhagic stroke ([place])   . ICH (intracerebral hemorrhage) ([place]) 03/12/2018  . Prostate cancer ([place]) 01/29/2017  . 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 Diabetes ([place]) 12/14/2015  . PCP NOTES >>>>>>>>>>>>>>>>>>>>>>>>>>>>>>. 08/06/2015  . Dizziness and giddiness 01/29/2015  . Umbilical hernia [id number]  . Elevated PSA, less than 10 ng/ml 04/19/2012  . Annual physical exam 01/24/2011  . Hyperlipidemia 01/03/2010  . Essential hypertension 09/20/2007    Willow Ora, PTA, [facility] [address] [place], [place] [address] [phone] 05/05/19, 10:08 AM   Name: Marcus Beasley MRN: [id number] Date of Birth: [birth year]

## 2019-05-05 NOTE — Therapy (Signed)
[facility] [address] [place], [place], [address] Phone: [phone]   Fax:  [phone]  Occupational Therapy Treatment  Patient Details  Name: Marcus Beasley MRN: [id number] Date of Birth: [birth year] Referring Provider (OT): Delice Lesch   Encounter Date: 05/05/2019  OT End of Session - 05/05/19 0937    Visit Number  3    Number of Visits  17    Date for OT Re-Evaluation  06/11/19    Authorization Type  [employer] MCR    Authorization - Visit Number  3    Authorization - Number of Visits  10    OT Start Time  [phone]    OT Stop Time  1015    OT Time Calculation (min)  39 min    Activity Tolerance  Patient tolerated treatment well    Behavior During Therapy  [facility] for tasks assessed/performed       Past Medical History:  Diagnosis Date  . BPH (benign prostatic hyperplasia)    (-) Bx 2015  . Diabetes mellitus without complication ([place])   . Elevated PSA    Prostate Bx in 06/2013 was benign  . HTN (hypertension)   . Hyperlipidemia   . Macular degeneration, age related     Past Surgical History:  Procedure Laterality Date  . ABCESS DRAINAGE     abdomen- 26 day hospitalization 1968  . COLONOSCOPY  2016  . HERNIA REPAIR  summer '11   umbilical, dr Ninfa Linden   . POLYPECTOMY    . PROSTATE BIOPSY  06-2013 , 07-2017   (-), (-)    There were no vitals filed for this visit.  Subjective Assessment - 05/05/19 0937    Subjective   Denies pain at rest    Pertinent History  Rt thalamic hemorrhage 03/12/18. PMH: HTN, HLD, DM, A-fib    Limitations  Fall risk    Patient Stated Goals  get more function in my Lt arm and hand    Currently in Pain?  No/denies    Pain Onset  More than a month ago           Treatment: supine, scapular and shoulder retraction/ depression, min-mod facilitation followed by closed chain chest press and shoulder flexion, min-mod facilitation Seated low range shoulder flexion with LUE supported on  quad cane min v.c/ facilitation Weight bearing through bilateral UE's with scapular retraction, min v.c/ facilitation.  Low range functional reaching to place and remove dowel pegs from pegboard  Min-mod difficulty/ v.c                   OT Short Term Goals - 05/05/19 1008      OT SHORT TERM GOAL #1   Title  Pt independent with updated LUE HEP for neuro re-education - 05/12/19    Time  4    Period  Weeks    Status  On-going      OT SHORT TERM GOAL #2   Title  Pt independent with updated Lt hand    Time  4    Period  Weeks    Status  New      OT SHORT TERM GOAL #3   Title  Pt to verbalize understanding with safety considerations LUE d/t lack of sensation    Time  4    Period  Weeks    Status  New      OT SHORT TERM GOAL #4   Title  Pt to verbalize understanding of  A/E for shoelaces and cooking    Time  4    Period  Weeks    Status  New      OT SHORT TERM GOAL #5   Title  Pt to consistently perform UE dressing (including jacket) and bathing Rt arm fully at mod I level    Baseline  min assist    Time  4    Period  Weeks    Status  New      OT SHORT TERM GOAL #6   Title  Pt to achieve 90* shoulder flexion LUE w/ min compensations and pain 3/10 or under    Time  4    Period  Weeks    Status  New        OT Long Term Goals - 04/11/19 [id number]      OT LONG TERM GOAL #1   Title  Pt to improve function LUE as evidenced by performing 18 on [employer] test LUE - 06/11/19    Baseline  12    Time  8    Period  Weeks    Status  New      OT LONG TERM GOAL #2   Title  Pt to improve LUE coordination as evidenced by placing 9 pegs in pegboard in 2 min. or under    Baseline  only placed 1 peg in 2 min    Time  8    Period  Weeks    Status  New      OT LONG TERM GOAL #3   Title  Pt to consistently demo mid level reaching w/ min compensations to reduce pain LUE    Time  8    Period  Weeks    Status  New      OT LONG TERM GOAL #4   Title  Pt able to tie  shoelaces with extra time prn    Time  8    Period  Weeks    Status  New            Plan - 05/05/19 1008    Clinical Impression Statement  Pt is progressing towards goals. He requires v.c and facilitation to avoid shoulder hiking and compensatory patterns.    Occupational Profile and client history currently impacting functional performance  PMH; HTN, HLD, DM, A-fib. Current deficits impeding pt's ability to perform ADLS, IADLS, and role as husband, friend, and leisure participation    Occupational performance deficits (Please refer to evaluation for details):  ADL's;IADL's;Leisure    Body Structure / Function / Physical Skills  ADL;Decreased knowledge of precautions;ROM;UE functional use;FMC;Decreased knowledge of use of DME;Balance;Sensation;Endurance;Pain;Strength;Coordination;IADL;Proprioception;Tone;Mobility    Cognitive Skills  Attention;Safety Awareness;Memory    Rehab Potential  Good    OT Frequency  2x / week    OT Duration  8 weeks    OT Treatment/Interventions  Self-care/ADL training;Moist Heat;DME and/or AE instruction;Splinting;Therapeutic activities;Psychosocial skills training;Aquatic Therapy;Therapeutic exercise;Cognitive remediation/compensation;Coping strategies training;Neuromuscular education;Functional Mobility Training;Passive range of motion;Visual/perceptual remediation/compensation;Manual Therapy;Patient/family education;Electrical Stimulation    Plan  review HEP, continue neuro re-education and low range reaching LUE, Pt needs some tactile assist for scapula    Consulted and Agree with Plan of Care  Patient       Patient will benefit from skilled therapeutic intervention in order to improve the following deficits and impairments:   Body Structure / Function / Physical Skills: ADL, Decreased knowledge of precautions, ROM, UE functional use, FMC, Decreased knowledge of use of DME, Balance,  Sensation, Endurance, Pain, Strength, Coordination, IADL, Proprioception,  Tone, Mobility Cognitive Skills: Attention, Safety Awareness, Memory     Visit Diagnosis: Muscle weakness (generalized)  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 Subacromial bursitis of left shoulder joint 09/10/2018  . Spastic hemiplegia affecting nondominant side ([place]) 07/07/2018  . Abnormality of gait 06/09/2018  . Neuropathic pain   . Labile blood pressure   . PAF (paroxysmal atrial fibrillation) ([place])   . Hemiparesis affecting left side as late effect of stroke ([place])   . Thalamic hemorrhage ([place]) 03/17/2018  . Benign essential HTN   . Dyslipidemia   . Hemorrhagic stroke ([place])   . ICH (intracerebral hemorrhage) ([place]) 03/12/2018  . Prostate cancer ([place]) 01/29/2017  . 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 Diabetes ([place]) 12/14/2015  . PCP NOTES >>>>>>>>>>>>>>>>>>>>>>>>>>>>>>. 08/06/2015  . Dizziness and giddiness 01/29/2015  . Umbilical hernia [id number]  . Elevated PSA, less than 10 ng/ml 04/19/2012  . Annual physical exam 01/24/2011  . Hyperlipidemia 01/03/2010  . Essential hypertension 09/20/2007    RINE,KATHRYN 05/05/2019, 10:09 AM  [facility] [address] [place], [place], [address] Phone: [phone]   Fax:  [phone]  Name: LAMONTE ROSENCRANTZ MRN: [id number] Date of Birth: 1946-05-06

## 2019-05-10 ENCOUNTER — Ambulatory Visit: Payer: Medicare Other | Admitting: Occupational Therapy

## 2019-05-10 ENCOUNTER — Other Ambulatory Visit: Payer: Self-pay

## 2019-05-10 ENCOUNTER — Ambulatory Visit: Payer: Medicare Other | Admitting: Physical Therapy

## 2019-05-10 ENCOUNTER — Encounter: Payer: Self-pay | Admitting: Physical Therapy

## 2019-05-10 DIAGNOSIS — M25512 Pain in left shoulder: Secondary | ICD-10-CM

## 2019-05-10 DIAGNOSIS — I69254 Hemiplegia and hemiparesis following other nontraumatic intracranial hemorrhage affecting left non-dominant side: Secondary | ICD-10-CM

## 2019-05-10 DIAGNOSIS — G8929 Other chronic pain: Secondary | ICD-10-CM

## 2019-05-10 DIAGNOSIS — R2681 Unsteadiness on feet: Secondary | ICD-10-CM

## 2019-05-10 DIAGNOSIS — M6281 Muscle weakness (generalized): Secondary | ICD-10-CM

## 2019-05-10 DIAGNOSIS — R2689 Other abnormalities of gait and mobility: Secondary | ICD-10-CM | POA: Diagnosis not present

## 2019-05-10 DIAGNOSIS — R209 Unspecified disturbances of skin sensation: Secondary | ICD-10-CM | POA: Diagnosis not present

## 2019-05-10 DIAGNOSIS — R278 Other lack of coordination: Secondary | ICD-10-CM

## 2019-05-10 DIAGNOSIS — I69118 Other symptoms and signs involving cognitive functions following nontraumatic intracerebral hemorrhage: Secondary | ICD-10-CM | POA: Diagnosis not present

## 2019-05-10 NOTE — Therapy (Signed)
[facility] [address] [place], [place], [address] Phone: [phone]   Fax:  [phone]  Physical Therapy Treatment  Patient Details  Name: Marcus Beasley MRN: [id number] Date of Birth: 09/27/1945 Referring Provider (PT): Patel   Encounter Date: 05/10/2019  PT End of Session - 05/10/19 0933    Visit Number  4    Number of Visits  17    Date for PT Re-Evaluation  07/10/19    Authorization Type  UHC Medicare; will need 10th visit progress note    PT Start Time  0932    PT Stop Time  1014    PT Time Calculation (min)  42 min    Equipment Utilized During Treatment  Gait belt    Activity Tolerance  Patient tolerated treatment well;No increased pain    Behavior During Therapy  WFL for tasks assessed/performed       Past Medical History:  Diagnosis Date  . BPH (benign prostatic hyperplasia)    (-) Bx 2015  . Diabetes mellitus without complication (HCC)   . Elevated PSA    Prostate Bx in 06/2013 was benign  . HTN (hypertension)   . Hyperlipidemia   . Macular degeneration, age related     Past Surgical History:  Procedure Laterality Date  . ABCESS DRAINAGE     abdomen- 26 day hospitalization 1968  . COLONOSCOPY  2016  . HERNIA REPAIR  summer '11   umbilical, dr Blackman   . POLYPECTOMY    . PROSTATE BIOPSY  06-2013 , 07-2017   (-), (-)    There were no vitals filed for this visit.  Subjective Assessment - 05/10/19 0932    Subjective  No new complaints. No falls or pain to report. Has to cancel this Thursday due to appointment with Dr. Patel that same day.    Patient Stated Goals  I'd like to be able towalk a little better.    Currently in Pain?  No/denies    Pain Score  0-No pain          [facility] Adult PT Treatment/Exercise - 05/10/19 0934      Transfers   Transfers  Sit to Stand;Stand to Sit    Sit to Stand  6: Modified independent (Device/Increase time);Without upper extremity assist;From  chair/3-in-1    Stand to Sit  6: Modified independent (Device/Increase time);Without upper extremity assist;To chair/3-in-1      Ambulation/Gait   Ambulation/Gait  Yes    Ambulation/Gait Assistance  5: Supervision;4: Min guard    Ambulation/Gait Assistance Details  no device in session with cues for posture, equal step length and increased stance time on left LE. min guard assist with gait with no AD, supervision for gait with use of small based quad cane.                           Ambulation Distance (Feet)  --   around gym with session.    Assistive device  Small based quad cane;None    Gait Pattern  Step-through pattern;Decreased arm swing - left;Decreased step length - left;Decreased stance time - left;Decreased dorsiflexion - left;Decreased weight shift to left;Trunk flexed    Ambulation Surface  Level;Indoor      Neuro Re-ed    Neuro Re-ed Details   for balance/strengthening: sit<>stands with feet on airex while holding 2# ball for 2 sets of 10 reps, cues for full upright standing and slow, controlled descent.         Knee/Hip Exercises: Aerobic   Other Aerobic  Scifit UE/LE level 3.0 for 5 minutes with goal of 60 rpm for strengthening and activity tolerance      Knee/Hip Exercises: Standing   Heel Raises  Both;1 set;10 reps;Limitations    Heel Raises Limitations  heels off edge bottom steps: cues on full range with each rep with light UE support on rails.     Lateral Step Up  Left;1 set;10 reps;Hand Hold: 2;Step Height: 6";Limitations    Lateral Step Up Limitations  use of bottom step with cues on form and technique    Forward Step Up  Left;1 set;10 reps;Hand Hold: 2;Step Height: 6";Limitations    Forward Step Up Limitations  cues on form and technique, cues for light support on rails.           Balance Exercises - 05/10/19 0951      Balance Exercises: Standing   SLS with Vectors  Foam/compliant surface;Intermittent upper extremity assist;Other reps (comment);Limitations     Balance Beam  standing across blue foam beam no UE support with occasional touch to bars: alternating fwd heel taps for floor/back onto beam, then alternating bwd toe taps to floor/back onto beam. cues for increased step length and step height to clear beam surface. min guard to min assist for balance.     Tandem Gait  Forward;Retro;Intermittent upper extremity support;3 reps;Limitations    Sidestepping  Foam/compliant support;Upper extremity support;3 reps;Limitations      Balance Exercises: Standing   SLS with Vectors Limitations  on 1 inch foam with 2 tall cones on floor in front, intermittent touch to bars for balance- alternating forward foot taps, then alternating cross foot taps for 8-10 reps each. cues for posture, stance position and increased hip/knee flexion to tap cone.      Tandem Gait Limitations  no blue mat in parallel bars- 3 laps each way with light UE support on bars, cues for heel to toe placement as pt tends to do more modified tandem step placement. min guard assist for balance.     Sidestepping Limitations  on blue mat in parallel bars- 3-4 laps with light to no UE support on bars, cues for increased step height so not to shuffle foot. min guard to min assist for balance.           PT Short Term Goals - 04/11/19 1500      PT SHORT TERM GOAL #1   Title  Pt will be independent with HEP for improved strength, balance, gait.  TARGET 4 weeks, 05/13/19 (may be modified due to delayed scheduling from eval)    Time  4    Period  Weeks    Status  New    Target Date  05/13/19      PT SHORT TERM GOAL #2   Title  Pt will improve DGI score to at least 15/24 for decreased fall risk.    Baseline  DGI 12/24 04/11/2019 eval    Time  4    Period  Weeks    Status  New      PT SHORT TERM GOAL #3   Title  Pt will improve 5x sit<>stand to less than or equal to 13 seconds for improved transfer efficiency and functional strength.    Time  4    Period  Weeks    Status  New      PT  SHORT TERM GOAL #4   Title  Pt will improve TUG score to less than   or equal to 15 seconds for decreased fall risk.    Time  4    Period  Weeks    Status  New        PT Long Term Goals - 04/11/19 1504      PT LONG TERM GOAL #1   Title  Pt will verbalize plans for progress of HPE, including continued community fitness upon d/c from PT.  TARGET 8 weeks, 06/10/2019 (may be delayed due to delayed scheduling from eval)    Time  8    Period  Weeks    Status  New      PT LONG TERM GOAL #2   Title  Pt will improve DGI to at least 19/24 for decreased fall risk.    Time  8    Period  Weeks    Status  New      PT LONG TERM GOAL #3   Title  Pt will improve gait velocity score to at least 2.3 ft/sec for improved gait efficiency and safety.    Time  8    Period  Weeks    Status  New      PT LONG TERM GOAL #4   Title  Pt will improve TUG score to less than or equal to 13.5 seconds for decreased fall risk.    Time  8    Period  Weeks    Status  New      PT LONG TERM GOAL #5   Title  Pt will ambulate at least 1000 ft using cane/no device, modified independently, for improved independence with gait on indoor and outdoor surfaces    Time  8    Period  Weeks    Status  New      PT LONG TERM GOAL #6   Title  Pt will negotiate at least 12 steps, one handrail, step-through pattern, modified independently, for improved stair negotiation in home.    Time  8    Period  Weeks    Status  New            Plan - 05/10/19 0933    Clinical Impression Statement  Today's skilled session focused on gait with no device, LE strengthening and balance reactions with no issues noted other than fatigue. Rest breaks taken through out session with good recovery. The pt is making steady progress toward goals and should benefit from continued PT to progress toward met goals.    Personal Factors and Comorbidities  Comorbidity 3+    Comorbidities  See PMH    Examination-Activity Limitations   Stairs;Locomotion Level;Transfers    Examination-Participation Restrictions  Community Activity;Yard Work;Meal Prep    Stability/Clinical Decision Making  Evolving/Moderate complexity    Rehab Potential  Good    Clinical Impairments Affecting Rehab Potential  good family support; independnet prior to CVA    PT Frequency  2x / week    PT Duration  8 weeks   plus eval   PT Treatment/Interventions  ADLs/Self Care Home Management;Electrical Stimulation;Therapeutic exercise;Therapeutic activities;Functional mobility training;Gait training;Stair training;DME Instruction;Balance training;Neuromuscular re-education;Patient/family education;Orthotic Fit/Training;Manual techniques;Aquatic Therapy    PT Next Visit Plan  STGs due 05/13/19; continue to work on gait with no device/no AFO, LE strengthening and balance reactions    PT Home Exercise Plan  Access Code: [email or web address]    Consulted and Agree with Plan of Care  Patient       Patient will benefit from skilled therapeutic intervention in order to improve the following   deficits and impairments:  Abnormal gait, Decreased activity tolerance, Decreased balance, Decreased mobility, Difficulty walking, Decreased strength, Impaired flexibility, Impaired tone  Visit Diagnosis: 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 Subacromial bursitis of left shoulder joint 09/10/2018  . Spastic hemiplegia affecting nondominant side (HCC) 07/07/2018  . Abnormality of gait 06/09/2018  . Neuropathic pain   . Labile blood pressure   . PAF (paroxysmal atrial fibrillation) (HCC)   . Hemiparesis affecting left side as late effect of stroke (HCC)   . 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 Benign essential HTN   . Dyslipidemia    . Hemorrhagic stroke (HCC)   . ICH (intracerebral hemorrhage) (HCC) 03/12/2018  . Prostate cancer (HCC) 01/29/2017  . 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 Diabetes (HCC) 12/14/2015  . PCP NOTES >>>>>>>>>>>>>>>>>>>>>>>>>>>>>>. 08/06/2015  . Dizziness and giddiness 01/29/2015  . Umbilical hernia 01/23/2014  . Elevated PSA, less than 10 ng/ml 04/19/2012  . Annual physical exam 01/24/2011  . Hyperlipidemia 01/03/2010  . Essential hypertension 09/20/2007    Kathy Bury, PTA, CLT [facility] [address], [address] [place], [place] [address] [phone] 05/11/19, 10:07 AM   Name: Marcus Beasley MRN: [id number] Date of Birth: 10/27/1945   

## 2019-05-10 NOTE — Therapy (Signed)
[facility] [address] [place], [place], [address] Phone: [phone]   Fax:  [phone]  Occupational Therapy Treatment  Patient Details  Name: Marcus Beasley MRN: [id number] Date of Birth: 04-Jun-1946 Referring Provider (OT): Delice Lesch   Encounter Date: 05/10/2019  OT End of Session - 05/10/19 [id number]    Visit Number  4    Number of Visits  17    Date for OT Re-Evaluation  06/11/19    Authorization Type  UHC MCR    Authorization - Visit Number  4    Authorization - Number of Visits  10    OT Start Time  0845    OT Stop Time  0930    OT Time Calculation (min)  45 min    Activity Tolerance  Patient tolerated treatment well    Behavior During Therapy  [facility] for tasks assessed/performed       Past Medical History:  Diagnosis Date  . BPH (benign prostatic hyperplasia)    (-) Bx 2015  . Diabetes mellitus without complication ([place])   . Elevated PSA    Prostate Bx in 06/2013 was benign  . HTN (hypertension)   . Hyperlipidemia   . Macular degeneration, age related     Past Surgical History:  Procedure Laterality Date  . ABCESS DRAINAGE     abdomen- 26 day hospitalization 1968  . COLONOSCOPY  2016  . HERNIA REPAIR  summer '11   umbilical, dr Ninfa Linden   . POLYPECTOMY    . PROSTATE BIOPSY  06-2013 , 07-2017   (-), (-)    There were no vitals filed for this visit.  Subjective Assessment - 05/10/19 0849    Subjective   No pain in my shoulder unless I go beyond my limit    Pertinent History  Rt thalamic hemorrhage 03/12/18. PMH: HTN, HLD, DM, A-fib    Limitations  Fall risk    Patient Stated Goals  get more function in my Lt arm and hand    Currently in Pain?  No/denies       Reviewed HEP - pt still required cueing to perform self guided AA/ROM in sh flexion (supine) correctly. Pt actually improved function when asked to do same movement while holding thick foam noodle both hands (more challenging) however  more functional.  Quadraped - attempted cat/cow stretch, but pt unable to perform correctly d/t apraxia (even after demo cues); wt shifts Lt in quadraped while disengaging RUE. Seated - wt bearing over Lt elbow while reaching for target RUE to promote wt bearing and scapula depression Lt side.  Low range functional reaching to place 1" blocks in bowl (for sh flex and abduction)  UBE x 5 min. Level 1 w/ Lt hand wrapped for reciprocal movement                      OT Short Term Goals - 05/05/19 1008      OT SHORT TERM GOAL #1   Title  Pt independent with updated LUE HEP for neuro re-education - 05/12/19    Time  4    Period  Weeks    Status  On-going      OT SHORT TERM GOAL #2   Title  Pt independent with updated Lt hand    Time  4    Period  Weeks    Status  New      OT SHORT TERM GOAL #3   Title  Pt to verbalize understanding with safety considerations LUE d/t lack of sensation    Time  4    Period  Weeks    Status  New      OT SHORT TERM GOAL #4   Title  Pt to verbalize understanding of A/E for shoelaces and cooking    Time  4    Period  Weeks    Status  New      OT SHORT TERM GOAL #5   Title  Pt to consistently perform UE dressing (including jacket) and bathing Rt arm fully at mod I level    Baseline  min assist    Time  4    Period  Weeks    Status  New      OT SHORT TERM GOAL #6   Title  Pt to achieve 90* shoulder flexion LUE w/ min compensations and pain 3/10 or under    Time  4    Period  Weeks    Status  New        OT Long Term Goals - 04/11/19 [id number]      OT LONG TERM GOAL #1   Title  Pt to improve function LUE as evidenced by performing 18 on [employer] test LUE - 06/11/19    Baseline  12    Time  8    Period  Weeks    Status  New      OT LONG TERM GOAL #2   Title  Pt to improve LUE coordination as evidenced by placing 9 pegs in pegboard in 2 min. or under    Baseline  only placed 1 peg in 2 min    Time  8    Period  Weeks     Status  New      OT LONG TERM GOAL #3   Title  Pt to consistently demo mid level reaching w/ min compensations to reduce pain LUE    Time  8    Period  Weeks    Status  New      OT LONG TERM GOAL #4   Title  Pt able to tie shoelaces with extra time prn    Time  8    Period  Weeks    Status  New            Plan - 05/10/19 1349    Clinical Impression Statement  Pt is progressing towards goals. He requires v.c and facilitation to avoid shoulder hiking and compensatory patterns.    Occupational Profile and client history currently impacting functional performance  PMH; HTN, HLD, DM, A-fib. Current deficits impeding pt's ability to perform ADLS, IADLS, and role as husband, friend, and leisure participation    Occupational performance deficits (Please refer to evaluation for details):  ADL's;IADL's;Leisure    Body Structure / Function / Physical Skills  ADL;Decreased knowledge of precautions;ROM;UE functional use;FMC;Decreased knowledge of use of DME;Balance;Sensation;Endurance;Pain;Strength;Coordination;IADL;Proprioception;Tone;Mobility    Cognitive Skills  Attention;Safety Awareness;Memory    Rehab Potential  Good    OT Frequency  2x / week    OT Duration  8 weeks    OT Treatment/Interventions  Self-care/ADL training;Moist Heat;DME and/or AE instruction;Splinting;Therapeutic activities;Psychosocial skills training;Aquatic Therapy;Therapeutic exercise;Cognitive remediation/compensation;Coping strategies training;Neuromuscular education;Functional Mobility Training;Passive range of motion;Visual/perceptual remediation/compensation;Manual Therapy;Patient/family education;Electrical Stimulation    Plan  continue neuro re-education and low range reaching LUE, Pt needs some tactile assist for scapula    Consulted and Agree with Plan of Care  Patient  Patient will benefit from skilled therapeutic intervention in order to improve the following deficits and impairments:   Body Structure  / Function / Physical Skills: ADL, Decreased knowledge of precautions, ROM, UE functional use, FMC, Decreased knowledge of use of DME, Balance, Sensation, Endurance, Pain, Strength, Coordination, IADL, Proprioception, Tone, Mobility Cognitive Skills: Attention, Safety Awareness, Memory     Visit Diagnosis: 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 Subacromial bursitis of left shoulder joint 09/10/2018  . Spastic hemiplegia affecting nondominant side ([place]) 07/07/2018  . Abnormality of gait 06/09/2018  . Neuropathic pain   . Labile blood pressure   . PAF (paroxysmal atrial fibrillation) ([place])   . Hemiparesis affecting left side as late effect of stroke ([place])   . Thalamic hemorrhage ([place]) 03/17/2018  . Benign essential HTN   . Dyslipidemia   . Hemorrhagic stroke ([place])   . ICH (intracerebral hemorrhage) ([place]) 03/12/2018  . Prostate cancer ([place]) 01/29/2017  . Cancer of trigone of urinary bladder ([place]) 01/29/2017  . Diabetes ([place]) 12/14/2015  . PCP NOTES >>>>>>>>>>>>>>>>>>>>>>>>>>>>>>. 08/06/2015  . Dizziness and giddiness 01/29/2015  . Umbilical hernia [id number]  . Elevated PSA, less than 10 ng/ml 04/19/2012  . Annual physical exam 01/24/2011  . Hyperlipidemia 01/03/2010  . Essential hypertension 09/20/2007    Carey Bullocks, OTR/L 05/10/2019, 1:51 PM  [facility] [address] [place], [place], [address] Phone: [phone]   Fax:  [phone]  Name: Marcus Beasley MRN: [id number] Date of Birth: Jan 20, 1946

## 2019-05-12 ENCOUNTER — Encounter: Payer: Medicare Other | Attending: Physical Medicine & Rehabilitation | Admitting: Physical Medicine & Rehabilitation

## 2019-05-12 ENCOUNTER — Other Ambulatory Visit: Payer: Self-pay

## 2019-05-12 ENCOUNTER — Encounter: Payer: Self-pay | Admitting: Physical Medicine & Rehabilitation

## 2019-05-12 ENCOUNTER — Encounter: Payer: Self-pay | Admitting: Internal Medicine

## 2019-05-12 VITALS — BP 135/87 | HR 73 | Temp 98.1°F | Resp 16 | Ht 71.0 in | Wt 176.0 lb

## 2019-05-12 DIAGNOSIS — G8110 Spastic hemiplegia affecting unspecified side: Secondary | ICD-10-CM

## 2019-05-12 DIAGNOSIS — I10 Essential (primary) hypertension: Secondary | ICD-10-CM | POA: Diagnosis not present

## 2019-05-12 DIAGNOSIS — M792 Neuralgia and neuritis, unspecified: Secondary | ICD-10-CM

## 2019-05-12 DIAGNOSIS — M7552 Bursitis of left shoulder: Secondary | ICD-10-CM

## 2019-05-12 DIAGNOSIS — I69354 Hemiplegia and hemiparesis following cerebral infarction affecting left non-dominant side: Secondary | ICD-10-CM | POA: Diagnosis not present

## 2019-05-12 DIAGNOSIS — M25512 Pain in left shoulder: Secondary | ICD-10-CM

## 2019-05-12 DIAGNOSIS — E119 Type 2 diabetes mellitus without complications: Secondary | ICD-10-CM | POA: Insufficient documentation

## 2019-05-12 DIAGNOSIS — R269 Unspecified abnormalities of gait and mobility: Secondary | ICD-10-CM

## 2019-05-12 DIAGNOSIS — G8929 Other chronic pain: Secondary | ICD-10-CM

## 2019-05-12 MED ORDER — PREGABALIN 50 MG PO CAPS: 50.0000 mg | ORAL_CAPSULE | Freq: Two times a day (BID) | ORAL | 1 refills | Status: DC

## 2019-05-12 NOTE — Progress Notes (Signed)
Subjective:    Patient ID: Marcus Beasley, male    DOB: 1946-05-21, 73 y.o.   MRN: [id number]  HPI:  Right-handed male with history of diabetes mellitus, hypertension presents for follow up for right thalamic hemorrhage.   Last clinic visit 03/30/2019.  Since that time, he restarted therapies. He did not notice increase in Botox injection. No difference with Baclofen. He has completed his Gabapentin. Denies falls. Using a cane now.   Pain Inventory Average Pain 7 Pain Right Now 7 My pain is tingling and aching  In the last 24 hours, has pain interfered with the following? General activity 0 Relation with others 4 Enjoyment of life 7 What TIME of day is your pain at its worst? na Sleep (in general) Fair  Pain is worse with: some activites Pain improves with: rest and medication Relief from Meds: 5  Mobility walk with assistance use a cane ability to climb steps?  yes Do you have any goals in this area?  yes  Function retired I need assistance with the following:  meal prep, household duties and shopping  Neuro/Psych bladder control problems weakness numbness tingling trouble walking  Prior Studies Any changes since last visit?  no  Physicians involved in your care Any changes since last visit?  yes Urologist-Dr. Chrystine Oiler   Family History  Problem Relation Age of Onset  . Leukemia Mother   . Heart attack Father        MI age 26  . Heart disease Sister        age 5  . Cancer - Other Sister        type  . Heart disease Brother        age 33  . Kidney disease Brother        HD  . Diabetes Maternal Grandmother   . Prostate cancer Neg Hx   . Colon cancer Neg Hx   . Esophageal cancer Neg Hx   . Rectal cancer Neg Hx   . Stomach cancer Neg Hx    Social History   Socioeconomic History  . Marital status: Married    Spouse name: Not on file  . Number of children: 3  . Years of education: 82  . Highest education level: Not on file   Occupational History  . 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 Financial resource strain: Not on file  . Food insecurity    Worry: Not on file    Inability: Not on file  . Transportation needs    Medical: Not on file    Non-medical: Not on file  Tobacco Use  . Smoking status: Never Smoker  . Smokeless tobacco: Never Used  Substance and Sexual Activity  . Alcohol use: Yes    Comment: occ. beer  . Drug use: No  . Sexual activity: Yes    Partners: Female  Lifestyle  . Physical activity    Days per week: Not on file    Minutes per session: Not on file  . Stress: Not on file  Relationships  . Social Herbalist on phone: Not on file    Gets together: Not on file    Attends religious service: Not on file    Active member of club or organization: Not on file    Attends meetings of clubs or organizations: Not on file    Relationship status: Not on file  Other Topics Concern  .  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 2 dtrs , 1 son - homicide. 5 grandchildren.     Household: pt, wife, daughter and son   Does not drive    Past Surgical History:  Procedure Laterality Date  . ABCESS DRAINAGE     abdomen- 26 day hospitalization 1968  . COLONOSCOPY  2016  . HERNIA REPAIR  summer '11   umbilical, dr Ninfa Linden   . POLYPECTOMY    . PROSTATE BIOPSY  06-2013 , 07-2017   (-), (-)   Past Medical History:  Diagnosis Date  . BPH (benign prostatic hyperplasia)    (-) Bx 2015  . Diabetes mellitus without complication ([place])   . Elevated PSA    Prostate Bx in 06/2013 was benign  . HTN (hypertension)   . Hyperlipidemia   . Macular degeneration, age related    BP 135/87   Pulse 73   Temp 98.1 F (36.7 C)   Resp 16   Ht 5\' 11"  (1.803 m)   Wt 176 lb (79.8 kg)   BMI 24.55 kg/m   Opioid Risk Score:   Fall Risk Score:  `1  Depression screen PHQ 2/9  Depression screen [facility]  2/9 11/11/2018 10/06/2018 04/28/2018 08/25/2017 04/14/2016 12/14/2015 08/06/2015  Decreased Interest 0 0 0 0 0 0 0  Down, Depressed, Hopeless 0 0 0 0 0 0 0  PHQ - 2 Score 0 0 0 0 0 0 0  Some recent data might be hidden     Review of Systems  Constitutional: Negative.        Appetite poor and feels like he is still losing weight. (has not weighed since last visit)  HENT: Negative.   Eyes: Negative.   Respiratory: Negative.   Cardiovascular: Negative.   Gastrointestinal: Negative.   Endocrine: Negative.   Genitourinary: Negative.        Hesitancy  Musculoskeletal: Positive for arthralgias and gait problem.  Skin: Negative.   Allergic/Immunologic: Negative.   Neurological:       Tingling  Hematological: Negative.        On eliquis  Psychiatric/Behavioral: Negative.       Objective:   Physical Exam  Constitutional: No distress . Vital signs reviewed. HENT: Normocephalic.  Atraumatic. Eyes: EOMI. No discharge. Cardiovascular: No JVD. Respiratory: Normal effort.  No stridor. GI: Non-distended. Skin: Warm and dry.  Intact. Psych: Normal mood.  Normal behavior. Musc: No pain with ER/IR Left shoulder Pain with shoulder abduction Neuro Motor:  LUE: Shoulder abduction 4/5, elbow flex 4+/5, elbow extension 4/5, hand grip 4-/5 with apraxia, improving LLE: HF, KE, ADF 4+/5,improving Mas: left elbow flexors: 1/4, limited due to patient resistance, but appears to have some improvement Skin: Skin is warm and dry.  Psychiatric: He has a normal mood and affect.     Assessment & Plan:  Right-handed male with history of diabetes mellitus, hypertension presents for follow up for right thalamic hemorrhage, now with some spasticity.   1. Left-sided weakness secondary to right thalamic hemorrhage secondary to hypertensive crisis now with spasticity  Resume therapies when possible, continues to await  Cont follow up with Neurology  Will hold off on Botox injection (consider increase in future)    Left Biceps: 75 units   Left upper traps: 50 units   Left Med Pec: 50 units   2. Pain Management:   ?No benefit with Baclofen, ?taking at presents, pt states daughter manages  Tylenol as needed  Will consider Elavil 10 qhs, pt does not want at present  D/ced Tramadol  Cont ROM  Will transition from Gabapentin to Lyrica 50 BID, unable to tolerate higher doses of Gabapentin  3. Gait abnormality  Resume therapies when possible, continues to await  Cont cane for safety  4. Post stroke shoulder pain  Will schedule for subacromial steroid injection under ulatrasound

## 2019-05-13 ENCOUNTER — Ambulatory Visit: Payer: Medicare Other | Admitting: Physical Therapy

## 2019-05-13 ENCOUNTER — Ambulatory Visit: Payer: Medicare Other | Admitting: Occupational Therapy

## 2019-05-17 ENCOUNTER — Ambulatory Visit: Payer: Medicare Other | Admitting: Physical Therapy

## 2019-05-17 ENCOUNTER — Ambulatory Visit: Payer: Medicare Other | Admitting: Occupational Therapy

## 2019-05-17 ENCOUNTER — Other Ambulatory Visit: Payer: Self-pay

## 2019-05-17 DIAGNOSIS — M25512 Pain in left shoulder: Secondary | ICD-10-CM | POA: Diagnosis not present

## 2019-05-17 DIAGNOSIS — M6281 Muscle weakness (generalized): Secondary | ICD-10-CM | POA: Diagnosis not present

## 2019-05-17 DIAGNOSIS — R2681 Unsteadiness on feet: Secondary | ICD-10-CM | POA: Diagnosis not present

## 2019-05-17 DIAGNOSIS — R278 Other lack of coordination: Secondary | ICD-10-CM

## 2019-05-17 DIAGNOSIS — R2689 Other abnormalities of gait and mobility: Secondary | ICD-10-CM | POA: Diagnosis not present

## 2019-05-17 DIAGNOSIS — I69254 Hemiplegia and hemiparesis following other nontraumatic intracranial hemorrhage affecting left non-dominant side: Secondary | ICD-10-CM | POA: Diagnosis not present

## 2019-05-17 DIAGNOSIS — I69118 Other symptoms and signs involving cognitive functions following nontraumatic intracerebral hemorrhage: Secondary | ICD-10-CM | POA: Diagnosis not present

## 2019-05-17 DIAGNOSIS — G8929 Other chronic pain: Secondary | ICD-10-CM | POA: Diagnosis not present

## 2019-05-17 DIAGNOSIS — R209 Unspecified disturbances of skin sensation: Secondary | ICD-10-CM | POA: Diagnosis not present

## 2019-05-17 NOTE — Therapy (Signed)
[facility] [address] [place], [place], [address] Phone: [phone]   Fax:  [phone]  Occupational Therapy Treatment  Patient Details  Name: Marcus Beasley MRN: [id number] Date of Birth: [birth year] Referring Provider (OT): Delice Lesch   Encounter Date: 05/17/2019  OT End of Session - 05/17/19 0934    Visit Number  5    Number of Visits  17    Date for OT Re-Evaluation  06/11/19    Authorization Type  U[employer] MCR    Authorization - Visit Number  5    Authorization - Number of Visits  10    OT Start Time  0845    OT Stop Time  0930    OT Time Calculation (min)  45 min    Activity Tolerance  Patient tolerated treatment well    Behavior During Therapy  [facility] for tasks assessed/performed       Past Medical History:  Diagnosis Date  . BPH (benign prostatic hyperplasia)    (-) Bx 2015  . Diabetes mellitus without complication ([place])   . Elevated PSA    Prostate Bx in 06/2013 was benign  . HTN (hypertension)   . Hyperlipidemia   . Macular degeneration, age related     Past Surgical History:  Procedure Laterality Date  . ABCESS DRAINAGE     abdomen- 26 day hospitalization 1968  . COLONOSCOPY  2016  . HERNIA REPAIR  summer '11   umbilical, dr Ninfa Linden   . POLYPECTOMY    . PROSTATE BIOPSY  06-2013 , 07-2017   (-), (-)    There were no vitals filed for this visit.  Subjective Assessment - 05/17/19 0852    Pertinent History  Rt thalamic hemorrhage 03/12/18. PMH: HTN, HLD, DM, A-fib    Limitations  Fall risk    Patient Stated Goals  get more function in my Lt arm and hand    Currently in Pain?  No/denies       Supine: BUE sh flexion with mod facilitation LUE to prevent compensations/pain and cues to move slower w/ focus on preventing IR and abduction at Lt shoulder. Seated: AA/ROM LUE mid to high level ranges closed chain w/ UE Ranger w/ min cueing. Followed by BUE low to mid level reaching w/ ball from floor  to midrange and to bilateral sides w/ trunk rotation. Functional low level reaching to place checkers into Connect 4 slots w/ min assist for distal control and to focus on forearm rotation vs. Sh IR. Pt w/ min drops Lt hand. UBE x 5 min for reciprocal movement pattern (level 1) - pt able to do without Lt hand being wrapped.                       OT Short Term Goals - 05/05/19 1008      OT SHORT TERM GOAL #1   Title  Pt independent with updated LUE HEP for neuro re-education - 05/12/19    Time  4    Period  Weeks    Status  On-going      OT SHORT TERM GOAL #2   Title  Pt independent with updated Lt hand    Time  4    Period  Weeks    Status  New      OT SHORT TERM GOAL #3   Title  Pt to verbalize understanding with safety considerations LUE d/t lack of sensation    Time  4    Period  Weeks    Status  New      OT SHORT TERM GOAL #4   Title  Pt to verbalize understanding of A/E for shoelaces and cooking    Time  4    Period  Weeks    Status  New      OT SHORT TERM GOAL #5   Title  Pt to consistently perform UE dressing (including jacket) and bathing Rt arm fully at mod I level    Baseline  min assist    Time  4    Period  Weeks    Status  New      OT SHORT TERM GOAL #6   Title  Pt to achieve 90* shoulder flexion LUE w/ min compensations and pain 3/10 or under    Time  4    Period  Weeks    Status  New        OT Long Term Goals - 04/11/19 [id number]      OT LONG TERM GOAL #1   Title  Pt to improve function LUE as evidenced by performing 18 on [employer] test LUE - 06/11/19    Baseline  12    Time  8    Period  Weeks    Status  New      OT LONG TERM GOAL #2   Title  Pt to improve LUE coordination as evidenced by placing 9 pegs in pegboard in 2 min. or under    Baseline  only placed 1 peg in 2 min    Time  8    Period  Weeks    Status  New      OT LONG TERM GOAL #3   Title  Pt to consistently demo mid level reaching w/ min compensations to  reduce pain LUE    Time  8    Period  Weeks    Status  New      OT LONG TERM GOAL #4   Title  Pt able to tie shoelaces with extra time prn    Time  8    Period  Weeks    Status  New            Plan - 05/17/19 0934    Clinical Impression Statement  Pt w/ decreased sh discomfort when focusing on slow quality movement vs. quick movement. Pt still requires cueing for this    Occupational Profile and client history currently impacting functional performance  PMH; HTN, HLD, DM, A-fib. Current deficits impeding pt's ability to perform ADLS, IADLS, and role as husband, friend, and leisure participation    Occupational performance deficits (Please refer to evaluation for details):  ADL's;IADL's;Leisure    Body Structure / Function / Physical Skills  ADL;Decreased knowledge of precautions;ROM;UE functional use;FMC;Decreased knowledge of use of DME;Balance;Sensation;Endurance;Pain;Strength;Coordination;IADL;Proprioception;Tone;Mobility    Cognitive Skills  Attention;Safety Awareness;Memory    Rehab Potential  Good    OT Frequency  2x / week    OT Duration  8 weeks    OT Treatment/Interventions  Self-care/ADL training;Moist Heat;DME and/or AE instruction;Splinting;Therapeutic activities;Psychosocial skills training;Aquatic Therapy;Therapeutic exercise;Cognitive remediation/compensation;Coping strategies training;Neuromuscular education;Functional Mobility Training;Passive range of motion;Visual/perceptual remediation/compensation;Manual Therapy;Patient/family education;Electrical Stimulation    Plan  continue NMR and LUE low range functional reaching and simple coordination tasks    Consulted and Agree with Plan of Care  Patient       Patient will benefit from skilled therapeutic intervention in order to improve the  following deficits and impairments:   Body Structure / Function / Physical Skills: ADL, Decreased knowledge of precautions, ROM, UE functional use, FMC, Decreased knowledge of use  of DME, Balance, Sensation, Endurance, Pain, Strength, Coordination, IADL, Proprioception, Tone, Mobility Cognitive Skills: Attention, Safety Awareness, Memory     Visit Diagnosis: 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 Chronic left shoulder pain 05/12/2019  . Subacromial bursitis of left shoulder joint 09/10/2018  . Spastic hemiplegia affecting nondominant side ([place]) 07/07/2018  . Abnormality of gait 06/09/2018  . Neuropathic pain   . Labile blood pressure   . PAF (paroxysmal atrial fibrillation) ([place])   . Hemiparesis affecting left side as late effect of stroke ([place])   . 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 Benign essential HTN   . Dyslipidemia   . Hemorrhagic stroke ([place])   . ICH (intracerebral hemorrhage) ([place]) 03/12/2018  . Prostate cancer ([place]) 01/29/2017  . 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 Diabetes ([place]) 12/14/2015  . PCP NOTES >>>>>>>>>>>>>>>>>>>>>>>>>>>>>>. 08/06/2015  . Dizziness and giddiness 01/29/2015  . Umbilical hernia [id number]  . Elevated PSA, less than 10 ng/ml 04/19/2012  . Annual physical exam 01/24/2011  . Hyperlipidemia 01/03/2010  . Essential hypertension 09/20/2007    Carey Bullocks, OTR/L 05/17/2019, 9:41 AM  [facility] [address] [place], [place], [address] Phone: [phone]   Fax:  [phone]  Name: Marcus Beasley MRN: [id number] Date of Birth: [birth year]

## 2019-05-23 DIAGNOSIS — H40033 Anatomical narrow angle, bilateral: Secondary | ICD-10-CM | POA: Diagnosis not present

## 2019-05-23 DIAGNOSIS — H35033 Hypertensive retinopathy, bilateral: Secondary | ICD-10-CM | POA: Diagnosis not present

## 2019-05-23 DIAGNOSIS — H2513 Age-related nuclear cataract, bilateral: Secondary | ICD-10-CM | POA: Diagnosis not present

## 2019-05-23 DIAGNOSIS — H40013 Open angle with borderline findings, low risk, bilateral: Secondary | ICD-10-CM | POA: Diagnosis not present

## 2019-05-23 DIAGNOSIS — H353131 Nonexudative age-related macular degeneration, bilateral, early dry stage: Secondary | ICD-10-CM | POA: Diagnosis not present

## 2019-05-23 LAB — HM DIABETES EYE EXAM

## 2019-05-24 ENCOUNTER — Encounter: Payer: Self-pay | Admitting: Physical Therapy

## 2019-05-24 ENCOUNTER — Ambulatory Visit: Payer: Medicare Other | Admitting: Physical Therapy

## 2019-05-24 ENCOUNTER — Ambulatory Visit: Payer: Medicare Other | Attending: Physical Medicine & Rehabilitation | Admitting: Occupational Therapy

## 2019-05-24 ENCOUNTER — Other Ambulatory Visit: Payer: Self-pay

## 2019-05-24 DIAGNOSIS — R2689 Other abnormalities of gait and mobility: Secondary | ICD-10-CM | POA: Insufficient documentation

## 2019-05-24 DIAGNOSIS — I69118 Other symptoms and signs involving cognitive functions following nontraumatic intracerebral hemorrhage: Secondary | ICD-10-CM | POA: Diagnosis not present

## 2019-05-24 DIAGNOSIS — M25512 Pain in left shoulder: Secondary | ICD-10-CM | POA: Insufficient documentation

## 2019-05-24 DIAGNOSIS — I69254 Hemiplegia and hemiparesis following other nontraumatic intracranial hemorrhage affecting left non-dominant side: Secondary | ICD-10-CM | POA: Insufficient documentation

## 2019-05-24 DIAGNOSIS — R2681 Unsteadiness on feet: Secondary | ICD-10-CM | POA: Diagnosis not present

## 2019-05-24 DIAGNOSIS — R209 Unspecified disturbances of skin sensation: Secondary | ICD-10-CM | POA: Diagnosis not present

## 2019-05-24 DIAGNOSIS — G8929 Other chronic pain: Secondary | ICD-10-CM | POA: Insufficient documentation

## 2019-05-24 DIAGNOSIS — M6281 Muscle weakness (generalized): Secondary | ICD-10-CM

## 2019-05-24 DIAGNOSIS — R278 Other lack of coordination: Secondary | ICD-10-CM | POA: Diagnosis not present

## 2019-05-24 NOTE — Therapy (Signed)
[facility] [address] [place], [place], [address] Phone: [phone]   Fax:  [phone]  Physical Therapy Treatment  Patient Details  Name: Marcus Beasley MRN: [id number] Date of Birth: 06/06/1946 Referring Provider (PT): Posey Pronto   Encounter Date: 05/24/2019  PT End of Session - 05/24/19 2015    Visit Number  5    Number of Visits  17    Date for PT Re-Evaluation  07/10/19    Authorization Type  UHC Medicare; will need 10th visit progress note    PT Start Time  0935    PT Stop Time  1019    PT Time Calculation (min)  44 min    Equipment Utilized During Treatment  Gait belt    Activity Tolerance  Patient tolerated treatment well    Behavior During Therapy  [facility] for tasks assessed/performed       Past Medical History:  Diagnosis Date  . BPH (benign prostatic hyperplasia)    (-) Bx 2015  . Diabetes mellitus without complication ([place])   . Elevated PSA    Prostate Bx in 06/2013 was benign  . HTN (hypertension)   . Hyperlipidemia   . Macular degeneration, age related     Past Surgical History:  Procedure Laterality Date  . ABCESS DRAINAGE     abdomen- 26 day hospitalization 1968  . COLONOSCOPY  2016  . HERNIA REPAIR  summer '11   umbilical, dr Ninfa Linden   . POLYPECTOMY    . PROSTATE BIOPSY  06-2013 , 07-2017   (-), (-)    There were no vitals filed for this visit.  Subjective Assessment - 05/24/19 0937    Subjective  No new issues, no falls, no pain.  Sometimes have heaviness in that leg when I'm sitting for long periods.    Patient Stated Goals  I'd like to be able towalk a little better.    Currently in Pain?  No/denies                       [facility] Adult PT Treatment/Exercise - 05/24/19 0939      Transfers   Transfers  Sit to Stand;Stand to Sit    Sit to Stand  6: Modified independent (Device/Increase time);Without upper extremity assist;From chair/3-in-1    Five time sit to stand  comments   16.07   14.46 sec 2nd trial, no UE support   Stand to Sit  6: Modified independent (Device/Increase time);Without upper extremity assist;To chair/3-in-1    Comments   5x sit<>stand 13.75 sec with UE support      Ambulation/Gait   Ambulation/Gait  Yes    Ambulation/Gait Assistance  5: Supervision    Ambulation Distance (Feet)  115 Feet   x 4 reps, 20-40 ft x 4 reps no device   Assistive device  Small based quad cane;None    Gait Pattern  Step-through pattern;Decreased arm swing - left;Decreased step length - left;Decreased stance time - left;Decreased dorsiflexion - left;Decreased weight shift to left;Trunk flexed   Decreased L push off   Ambulation Surface  Level;Indoor    Gait velocity  14.59 sec with cane (2.24 ft/sec); 14 sec, no device (2.34 ft/sec)      Standardized Balance Assessment   Standardized Balance Assessment  Timed Up and Go Test;Dynamic Gait Index      Dynamic Gait Index   Level Surface  Moderate Impairment    Change in Gait Speed  Mild Impairment  Gait with Horizontal Head Turns  Mild Impairment    Gait with Vertical Head Turns  Moderate Impairment    Gait and Pivot Turn  Normal    Step Over Obstacle  Moderate Impairment    Step Around Obstacles  Mild Impairment    Steps  Mild Impairment    Total Score  14      Timed Up and Go Test   TUG  Normal TUG    Normal TUG (seconds)  15.75   15.72 2nd trial with SBQC; no device:  15.91 sec      NMR:  In addition to TUG scores above, cues with sit>stand, upon standing to activate L gluts, quads for terminal knee extension.  Tactile and verbal cues provided.  With initial cues, pt able to improve L knee and hip extension in standing.  Sit<>stand in stride stance, with LLE tucked in posterior, x 5 reps, with cues for full knee/hip extension upon standing. At corner of mat, LLE in posterior position, sit<>stand, with therapist providing cues to be in more LLE plantarflexed position, x 5 reps.  With gait, cues  for terminal knee extension, hip extension in LLE stance for improved stability with gait.  Cues for relaxed L reciprocal arm swing  Therapeutic Exercise -At counter:  Bilateral heel raises x 10 reps   -LLE single leg heel raises x 5 reps, 2 sets   -wave squats, x 10 reps:  Minisquats>up on toes at counter    -forward lunges x 5 reps each leg   -side lunges x 5 reps each leg      PT Education - 05/24/19 2015    Education Details  POC, progress towards goals    Person(s) Educated  Patient    Methods  Explanation    Comprehension  Verbalized understanding       PT Short Term Goals - 05/24/19 0937      PT SHORT TERM GOAL #1   Title  Pt will be independent with HEP for improved strength, balance, gait.  TARGET 4 weeks, 05/13/19 (may be modified due to delayed scheduling from eval)    Time  4    Period  Weeks    Status  New    Target Date  05/13/19      PT SHORT TERM GOAL #2   Title  Pt will improve DGI score to at least 15/24 for decreased fall risk.    Baseline  DGI 12/24 04/11/2019 eval; 14/24 05/24/2019    Time  4    Period  Weeks    Status  Partially Met      PT SHORT TERM GOAL #3   Title  Pt will improve 5x sit<>stand to less than or equal to 13 seconds for improved transfer efficiency and functional strength.    Baseline  14.47 sec at best    Time  4    Period  Weeks    Status  Not Met      PT SHORT TERM GOAL #4   Title  Pt will improve TUG score to less than or equal to 15 seconds for decreased fall risk.    Baseline  15.75 sec 05/24/2019    Time  4    Period  Weeks    Status  Partially Met        PT Long Term Goals - 04/11/19 1504      PT LONG TERM GOAL #1   Title  Pt will verbalize plans for progress of  HPE, including continued community fitness upon d/c from PT.  TARGET 8 weeks, 06/10/2019 (may be delayed due to delayed scheduling from eval)    Time  8    Period  Weeks    Status  New      PT LONG TERM GOAL #2   Title  Pt will improve DGI to at least  19/24 for decreased fall risk.    Time  8    Period  Weeks    Status  New      PT LONG TERM GOAL #3   Title  Pt will improve gait velocity score to at least 2.3 ft/sec for improved gait efficiency and safety.    Time  8    Period  Weeks    Status  New      PT LONG TERM GOAL #4   Title  Pt will improve TUG score to less than or equal to 13.5 seconds for decreased fall risk.    Time  8    Period  Weeks    Status  New      PT LONG TERM GOAL #5   Title  Pt will ambulate at least 1000 ft using cane/no device, modified independently, for improved independence with gait on indoor and outdoor surfaces    Time  8    Period  Weeks    Status  New      PT LONG TERM GOAL #6   Title  Pt will negotiate at least 12 steps, one handrail, step-through pattern, modified independently, for improved stair negotiation in home.    Time  8    Period  Weeks    Status  New            Plan - 05/24/19 2016    Clinical Impression Statement  Began assessing STGs this visit, with pt partially meeting STG 2 for improved DGI, partially meeting STG 3 for 5x sit<>stand, partially meeting STG 4 for TUG.  Pt made improvements in all three of these functional measures, but just did not quite get to goal level.  Pt has only been seen 4 visits since initial evaluation, due to scheduling conflicts; pt is motivated for therapy and has made some progress despite fewer than anticipated visits since evaluation.  He will continue to benefit from skilled PT to further address strength, balance, gait training for improved functional mobility and independence.    Personal Factors and Comorbidities  Comorbidity 3+    Comorbidities  See PMH    Examination-Activity Limitations  Stairs;Locomotion Level;Transfers    Examination-Participation Restrictions  Community Activity;Yard Work;Meal Prep    Stability/Clinical Decision Making  Evolving/Moderate complexity    Rehab Potential  Good    Clinical Impairments Affecting Rehab  Potential  good family support; independnet prior to CVA    PT Frequency  2x / week    PT Duration  8 weeks   plus eval   PT Treatment/Interventions  ADLs/Self Care Home Management;Electrical Stimulation;Therapeutic exercise;Therapeutic activities;Functional mobility training;Gait training;Stair training;DME Instruction;Balance training;Neuromuscular re-education;Patient/family education;Orthotic Fit/Training;Manual techniques;Aquatic Therapy    PT Next Visit Plan  Review HEP to check STG 1; Work on exercises to strengthen L plantarflexors-forward lunges, stride stance sit<>stand from corner of mat, single limb heel raises, wave squats (squats>up on toes) and add to HEP as needed; gait with SPC versus no device,continuing to work on Aeronautical engineer for improved smoothness of gait    PT Home Exercise Plan  Access Code: [email or web address]    Consulted and  Agree with Plan of Care  Patient       Patient will benefit from skilled therapeutic intervention in order to improve the following deficits and impairments:  Abnormal gait, Decreased activity tolerance, Decreased balance, Decreased mobility, Difficulty walking, Decreased strength, Impaired flexibility, Impaired tone  Visit Diagnosis: Unsteadiness on feet  Other abnormalities of gait and mobility  Muscle weakness (generalized)     Problem List Patient Active Problem List   Diagnosis Date Noted  . Chronic left shoulder pain 05/12/2019  . Subacromial bursitis of left shoulder joint 09/10/2018  . Spastic hemiplegia affecting nondominant side ([place]) 07/07/2018  . Abnormality of gait 06/09/2018  . Neuropathic pain   . Labile blood pressure   . PAF (paroxysmal atrial fibrillation) ([place])   . Hemiparesis affecting left side as late effect of stroke ([place])   . 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 Benign essential HTN   . Dyslipidemia   . Hemorrhagic stroke ([place])   . ICH (intracerebral hemorrhage) ([place]) 03/12/2018  . Prostate cancer ([place])  01/29/2017  . 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 Diabetes ([place]) 12/14/2015  . PCP NOTES >>>>>>>>>>>>>>>>>>>>>>>>>>>>>>. 08/06/2015  . Dizziness and giddiness 01/29/2015  . Umbilical hernia 28/97/9150  . Elevated PSA, less than 10 ng/ml 04/19/2012  . Annual physical exam 01/24/2011  . Hyperlipidemia 01/03/2010  . Essential hypertension 09/20/2007    Kyllie Pettijohn W. 05/24/2019, 8:25 PM  Frazier Butt., PT   [facility] [address] [place], [place], [address] Phone: [phone]   Fax:  [phone]  Name: Marcus Beasley MRN: [id number] Date of Birth: Oct 05, 1945

## 2019-05-24 NOTE — Therapy (Signed)
[facility] [address] [place], [place], [address] Phone: [phone]   Fax:  [phone]  Occupational Therapy Treatment  Patient Details  Name: Marcus Beasley MRN: [id number] Date of Birth: [birth year] Referring Provider (OT): Delice Lesch   Encounter Date: 05/24/2019  OT End of Session - 05/24/19 0928    Visit Number  6    Number of Visits  17    Date for OT Re-Evaluation  06/11/19    Authorization Type  [employer] MCR    Authorization - Visit Number  6    Authorization - Number of Visits  10    OT Start Time  0845    OT Stop Time  0930    OT Time Calculation (min)  45 min    Activity Tolerance  Patient tolerated treatment well    Behavior During Therapy  [facility] for tasks assessed/performed       Past Medical History:  Diagnosis Date  . BPH (benign prostatic hyperplasia)    (-) Bx 2015  . Diabetes mellitus without complication ([place])   . Elevated PSA    Prostate Bx in 06/2013 was benign  . HTN (hypertension)   . Hyperlipidemia   . Macular degeneration, age related     Past Surgical History:  Procedure Laterality Date  . ABCESS DRAINAGE     abdomen- 26 day hospitalization 1968  . COLONOSCOPY  2016  . HERNIA REPAIR  summer '11   umbilical, dr Ninfa Linden   . POLYPECTOMY    . PROSTATE BIOPSY  06-2013 , 07-2017   (-), (-)    There were no vitals filed for this visit.  Subjective Assessment - 05/24/19 0851    Pertinent History  Rt thalamic hemorrhage 03/12/18. PMH: HTN, HLD, DM, A-fib    Limitations  Fall risk    Patient Stated Goals  get more function in my Lt arm and hand    Currently in Pain?  No/denies       Seated: high level closed chain shoulder flexion using UE Ranger and cues to hold position and to lead w/ hand. Progressed to closed chain bilateral and reciprocal low to mid level reaching in shoulder flexion, and reciprocal low level abduction.  Flipping large cards over for pincer grasp and supination  - pt noted to have decreased ability to grade grasp due to significantly impaired sensation Lt hand.  Pt stacking 1" blocks, up to 3 blocks, 3 stacks w/ max difficulty d/t decreased sensation, coordination, and ability to grade needed pressure (pt would provide too much grasp) UBE x 8 min. Level 1 for reciprocal movement pattern.                       OT Short Term Goals - 05/05/19 1008      OT SHORT TERM GOAL #1   Title  Pt independent with updated LUE HEP for neuro re-education - 05/12/19    Time  4    Period  Weeks    Status  On-going      OT SHORT TERM GOAL #2   Title  Pt independent with updated Lt hand    Time  4    Period  Weeks    Status  New      OT SHORT TERM GOAL #3   Title  Pt to verbalize understanding with safety considerations LUE d/t lack of sensation    Time  4    Period  Weeks  Status  New      OT SHORT TERM GOAL #4   Title  Pt to verbalize understanding of A/E for shoelaces and cooking    Time  4    Period  Weeks    Status  New      OT SHORT TERM GOAL #5   Title  Pt to consistently perform UE dressing (including jacket) and bathing Rt arm fully at mod I level    Baseline  min assist    Time  4    Period  Weeks    Status  New      OT SHORT TERM GOAL #6   Title  Pt to achieve 90* shoulder flexion LUE w/ min compensations and pain 3/10 or under    Time  4    Period  Weeks    Status  New        OT Long Term Goals - 04/11/19 [id number]      OT LONG TERM GOAL #1   Title  Pt to improve function LUE as evidenced by performing 18 on Box & Blocks test LUE - 06/11/19    Baseline  12    Time  8    Period  Weeks    Status  New      OT LONG TERM GOAL #2   Title  Pt to improve LUE coordination as evidenced by placing 9 pegs in pegboard in 2 min. or under    Baseline  only placed 1 peg in 2 min    Time  8    Period  Weeks    Status  New      OT LONG TERM GOAL #3   Title  Pt to consistently demo mid level reaching w/ min compensations  to reduce pain LUE    Time  8    Period  Weeks    Status  New      OT LONG TERM GOAL #4   Title  Pt able to tie shoelaces with extra time prn    Time  8    Period  Weeks    Status  New            Plan - 05/24/19 [id number]    Clinical Impression Statement  Pt able to prevent compensations in low level reaching w/ cues. Pt w/ significantly decreased sensation Lt hand and decreased ability to grade pressure required for tasks.    Occupational Profile and client history currently impacting functional performance  PMH; HTN, HLD, DM, A-fib. Current deficits impeding pt's ability to perform ADLS, IADLS, and role as husband, friend, and leisure participation    Occupational performance deficits (Please refer to evaluation for details):  ADL's;IADL's;Leisure    Body Structure / Function / Physical Skills  ADL;Decreased knowledge of precautions;ROM;UE functional use;FMC;Decreased knowledge of use of DME;Balance;Sensation;Endurance;Pain;Strength;Coordination;IADL;Proprioception;Tone;Mobility    Cognitive Skills  Attention;Safety Awareness;Memory    Rehab Potential  Good    OT Frequency  2x / week    OT Duration  8 weeks    OT Treatment/Interventions  Self-care/ADL training;Moist Heat;DME and/or AE instruction;Splinting;Therapeutic activities;Psychosocial skills training;Aquatic Therapy;Therapeutic exercise;Cognitive remediation/compensation;Coping strategies training;Neuromuscular education;Functional Mobility Training;Passive range of motion;Visual/perceptual remediation/compensation;Manual Therapy;Patient/family education;Electrical Stimulation    Plan  address ADLS, A/E needs, safety considerations, and progress towards STG's    Consulted and Agree with Plan of Care  Patient       Patient will benefit from skilled therapeutic intervention in order to improve the following deficits and impairments:  Body Structure / Function / Physical Skills: ADL, Decreased knowledge of precautions, ROM, UE  functional use, FMC, Decreased knowledge of use of DME, Balance, Sensation, Endurance, Pain, Strength, Coordination, IADL, Proprioception, Tone, Mobility Cognitive Skills: Attention, Safety Awareness, Memory     Visit Diagnosis: 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 Chronic left shoulder pain 05/12/2019  . Subacromial bursitis of left shoulder joint 09/10/2018  . Spastic hemiplegia affecting nondominant side ([place]) 07/07/2018  . Abnormality of gait 06/09/2018  . Neuropathic pain   . Labile blood pressure   . PAF (paroxysmal atrial fibrillation) ([place])   . Hemiparesis affecting left side as late effect of stroke ([place])   . Thalamic hemorrhage ([place]) 03/17/2018  . Benign essential HTN   . Dyslipidemia   . Hemorrhagic stroke ([place])   . ICH (intracerebral hemorrhage) ([place]) 03/12/2018  . Prostate cancer ([place]) 01/29/2017  . 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 Diabetes ([place]) 12/14/2015  . PCP NOTES >>>>>>>>>>>>>>>>>>>>>>>>>>>>>>. 08/06/2015  . Dizziness and giddiness 01/29/2015  . Umbilical hernia [id number]  . Elevated PSA, less than 10 ng/ml 04/19/2012  . Annual physical exam 01/24/2011  . Hyperlipidemia 01/03/2010  . Essential hypertension 09/20/2007    Carey Bullocks, OTR/L 05/24/2019, 9:35 AM  [facility] [address] [place], [place], [address] Phone: [phone]   Fax:  [phone]  Name: Marcus Beasley MRN: [id number] Date of Birth: 1945-11-08

## 2019-05-26 ENCOUNTER — Encounter: Payer: Self-pay | Admitting: Physical Therapy

## 2019-05-26 ENCOUNTER — Ambulatory Visit: Payer: Medicare Other | Admitting: Occupational Therapy

## 2019-05-26 ENCOUNTER — Ambulatory Visit: Payer: Medicare Other | Admitting: Physical Therapy

## 2019-05-26 ENCOUNTER — Other Ambulatory Visit: Payer: Self-pay

## 2019-05-26 DIAGNOSIS — R278 Other lack of coordination: Secondary | ICD-10-CM | POA: Diagnosis not present

## 2019-05-26 DIAGNOSIS — M6281 Muscle weakness (generalized): Secondary | ICD-10-CM

## 2019-05-26 DIAGNOSIS — M25512 Pain in left shoulder: Secondary | ICD-10-CM | POA: Diagnosis not present

## 2019-05-26 DIAGNOSIS — I69118 Other symptoms and signs involving cognitive functions following nontraumatic intracerebral hemorrhage: Secondary | ICD-10-CM | POA: Diagnosis not present

## 2019-05-26 DIAGNOSIS — R2689 Other abnormalities of gait and mobility: Secondary | ICD-10-CM | POA: Diagnosis not present

## 2019-05-26 DIAGNOSIS — G8929 Other chronic pain: Secondary | ICD-10-CM | POA: Diagnosis not present

## 2019-05-26 DIAGNOSIS — I69254 Hemiplegia and hemiparesis following other nontraumatic intracranial hemorrhage affecting left non-dominant side: Secondary | ICD-10-CM

## 2019-05-26 DIAGNOSIS — R209 Unspecified disturbances of skin sensation: Secondary | ICD-10-CM | POA: Diagnosis not present

## 2019-05-26 DIAGNOSIS — R2681 Unsteadiness on feet: Secondary | ICD-10-CM | POA: Diagnosis not present

## 2019-05-26 DIAGNOSIS — R208 Other disturbances of skin sensation: Secondary | ICD-10-CM

## 2019-05-26 NOTE — Therapy (Signed)
[facility] [address] [place], [place], [address] Phone: [phone]   Fax:  [phone]  Occupational Therapy Treatment  Patient Details  Name: Marcus Beasley MRN: [id number] Date of Birth: Dec 17, 1945 Referring Provider (OT): Delice Lesch   Encounter Date: 05/26/2019  OT End of Session - 05/26/19 1003    Visit Number  7    Number of Visits  17    Date for OT Re-Evaluation  06/11/19    Authorization Type  [employer] MCR    Authorization - Visit Number  7    Authorization - Number of Visits  10    OT Start Time  [id number]    OT Stop Time  [id number]    OT Time Calculation (min)  40 min    Activity Tolerance  Patient tolerated treatment well    Behavior During Therapy  [facility] for tasks assessed/performed       Past Medical History:  Diagnosis Date  . BPH (benign prostatic hyperplasia)    (-) Bx 2015  . Diabetes mellitus without complication ([place])   . Elevated PSA    Prostate Bx in 06/2013 was benign  . HTN (hypertension)   . Hyperlipidemia   . Macular degeneration, age related     Past Surgical History:  Procedure Laterality Date  . ABCESS DRAINAGE     abdomen- 26 day hospitalization 1968  . COLONOSCOPY  2016  . HERNIA REPAIR  summer '11   umbilical, dr Ninfa Linden   . POLYPECTOMY    . PROSTATE BIOPSY  06-2013 , 07-2017   (-), (-)    There were no vitals filed for this visit.  Subjective Assessment - 05/26/19 1013    Pertinent History  Rt thalamic hemorrhage 03/12/18. PMH: HTN, HLD, DM, A-fib    Limitations  Fall risk    Patient Stated Goals  get more function in my Lt arm and hand    Currently in Pain?  No/denies            Treatment: supine closed chain shoulder flexion and chest press, max facilitation/ v.c initially for shoulder and scapular positioning then pt improved requiring min-mod facilitation  Arm bike for reciprocal movement 6 mins level 3 Seated at table low range functional reaching to place  large-small pegs into semicircle,cueing to compensate with vision due to sensory imapaitrment, max difficulty with smallest pegs, so therapist assisted with placement and pt removed from pegboard, min v.c to avoid compensation.                 OT Short Term Goals - 05/05/19 1008      OT SHORT TERM GOAL #1   Title  Pt independent with updated LUE HEP for neuro re-education - 05/12/19    Time  4    Period  Weeks    Status  On-going      OT SHORT TERM GOAL #2   Title  Pt independent with updated Lt hand    Time  4    Period  Weeks    Status  New      OT SHORT TERM GOAL #3   Title  Pt to verbalize understanding with safety considerations LUE d/t lack of sensation    Time  4    Period  Weeks    Status  New      OT SHORT TERM GOAL #4   Title  Pt to verbalize understanding of A/E for shoelaces and cooking    Time  4    Period  Weeks    Status  New      OT SHORT TERM GOAL #5   Title  Pt to consistently perform UE dressing (including jacket) and bathing Rt arm fully at mod I level    Baseline  min assist    Time  4    Period  Weeks    Status  New      OT SHORT TERM GOAL #6   Title  Pt to achieve 90* shoulder flexion LUE w/ min compensations and pain 3/10 or under    Time  4    Period  Weeks    Status  New        OT Long Term Goals - 04/11/19 [id number]      OT LONG TERM GOAL #1   Title  Pt to improve function LUE as evidenced by performing 18 on Box & Blocks test LUE - 06/11/19    Baseline  12    Time  8    Period  Weeks    Status  New      OT LONG TERM GOAL #2   Title  Pt to improve LUE coordination as evidenced by placing 9 pegs in pegboard in 2 min. or under    Baseline  only placed 1 peg in 2 min    Time  8    Period  Weeks    Status  New      OT LONG TERM GOAL #3   Title  Pt to consistently demo mid level reaching w/ min compensations to reduce pain LUE    Time  8    Period  Weeks    Status  New      OT LONG TERM GOAL #4   Title  Pt able to tie  shoelaces with extra time prn    Time  8    Period  Weeks    Status  New            Plan - 05/26/19 1005    Clinical Impression Statement  Pt is progressing towards goals. He requires max v.c / facilitation to avoid compensations with shoulder flexion. Pt demonstrates improving LUE functional use, however he continues to demonstrate sensory impairment.    Occupational Profile and client history currently impacting functional performance  PMH; HTN, HLD, DM, A-fib. Current deficits impeding pt's ability to perform ADLS, IADLS, and role as husband, friend, and leisure participation    Occupational performance deficits (Please refer to evaluation for details):  ADL's;IADL's;Leisure    Body Structure / Function / Physical Skills  ADL;Decreased knowledge of precautions;ROM;UE functional use;FMC;Decreased knowledge of use of DME;Balance;Sensation;Endurance;Pain;Strength;Coordination;IADL;Proprioception;Tone;Mobility    Cognitive Skills  Attention;Safety Awareness;Memory    Rehab Potential  Good    OT Frequency  2x / week    OT Duration  8 weeks    OT Treatment/Interventions  Self-care/ADL training;Moist Heat;DME and/or AE instruction;Splinting;Therapeutic activities;Psychosocial skills training;Aquatic Therapy;Therapeutic exercise;Cognitive remediation/compensation;Coping strategies training;Neuromuscular education;Functional Mobility Training;Passive range of motion;Visual/perceptual remediation/compensation;Manual Therapy;Patient/family education;Electrical Stimulation    Plan  address ADLS, A/E needs, safety considerations, and progress towards STG's    Consulted and Agree with Plan of Care  Patient       Patient will benefit from skilled therapeutic intervention in order to improve the following deficits and impairments:   Body Structure / Function / Physical Skills: ADL, Decreased knowledge of precautions, ROM, UE functional use, FMC, Decreased knowledge of use of DME, Balance, Sensation,  Endurance, Pain,  Strength, Coordination, IADL, Proprioception, Tone, Mobility Cognitive Skills: Attention, Safety Awareness, Memory     Visit Diagnosis: 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 Chronic left shoulder pain 05/12/2019  . Subacromial bursitis of left shoulder joint 09/10/2018  . Spastic hemiplegia affecting nondominant side ([place]) 07/07/2018  . Abnormality of gait 06/09/2018  . Neuropathic pain   . Labile blood pressure   . PAF (paroxysmal atrial fibrillation) ([place])   . Hemiparesis affecting left side as late effect of stroke ([place])   . Thalamic hemorrhage ([place]) 03/17/2018  . Benign essential HTN   . Dyslipidemia   . Hemorrhagic stroke ([place])   . ICH (intracerebral hemorrhage) ([place]) 03/12/2018  . Prostate cancer ([place]) 01/29/2017  . 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 Diabetes ([place]) 12/14/2015  . PCP NOTES >>>>>>>>>>>>>>>>>>>>>>>>>>>>>>. 08/06/2015  . Dizziness and giddiness 01/29/2015  . Umbilical hernia [id number]  . Elevated PSA, less than 10 ng/ml 04/19/2012  . Annual physical exam 01/24/2011  . Hyperlipidemia 01/03/2010  . Essential hypertension 09/20/2007    RINE,KATHRYN 05/26/2019, 10:13 AM  [facility] [address] [place], [place], [address] Phone: [phone]   Fax:  [phone]  Name: Marcus Beasley MRN: [id number] Date of Birth: 08/14/1945

## 2019-05-26 NOTE — Patient Instructions (Signed)
Access Code: [email or web address]  URL: [email or web address]  Date: 05/26/2019  Prepared by: Willow Ora   Exercises Sit to/from Stand in Stride position - 10 reps - 1 sets - 1x daily - 5x weekly Walking March - 3 reps - 1 sets - 1x daily - 5x weekly Tandem Walking with Counter Support - 3 reps - 1 sets - 1x daily - 5x weekly Standing Gastroc Stretch at [employer] - 3 reps - 1 sets - 30 hold - 1x daily - 5x weekly Romberg Stance Eyes Closed on Foam Pad - 3 reps - 1 sets - 30 hold - 1x daily - 5x weekly Wide Stance with Eyes Closed and Head Rotation on Foam Pad - 10 reps - 1 sets - 1x daily - 5x weekly Wide Stance with Eyes Closed and Head Nods on Foam Pad - 10 reps - 1 sets - 1x daily - 5x weekly

## 2019-05-27 NOTE — Therapy (Signed)
[facility] [address] [place], [place], [address] Phone: [phone]   Fax:  [phone]  Physical Therapy Treatment  Patient Details  Name: Marcus Beasley MRN: [id number] Date of Birth: May 23, 1946 Referring Provider (PT): Posey Pronto   Encounter Date: 05/26/2019  PT End of Session - 05/26/19 0851    Visit Number  6    Number of Visits  17    Date for PT Re-Evaluation  07/10/19    Authorization Type  [employer] Medicare; will need 10th visit progress note    PT Start Time  0847    PT Stop Time  0930    PT Time Calculation (min)  43 min    Equipment Utilized During Treatment  Gait belt    Activity Tolerance  Patient tolerated treatment well    Behavior During Therapy  [facility] for tasks assessed/performed       Past Medical History:  Diagnosis Date  . BPH (benign prostatic hyperplasia)    (-) Bx 2015  . Diabetes mellitus without complication ([place])   . Elevated PSA    Prostate Bx in 06/2013 was benign  . HTN (hypertension)   . Hyperlipidemia   . Macular degeneration, age related     Past Surgical History:  Procedure Laterality Date  . ABCESS DRAINAGE     abdomen- 26 day hospitalization 1968  . COLONOSCOPY  2016  . HERNIA REPAIR  summer '11   umbilical, dr Ninfa Linden   . POLYPECTOMY    . PROSTATE BIOPSY  06-2013 , 07-2017   (-), (-)    There were no vitals filed for this visit.  Subjective Assessment - 05/26/19 0850    Subjective  No new complaints. Noted pt to carry quad cane vs using it with walking into the gym.    Patient Stated Goals  I'd like to be able towalk a little better.    Currently in Pain?  No/denies    Pain Score  0-No pain            [facility] Adult PT Treatment/Exercise - 05/26/19 0908      Transfers   Transfers  Sit to Stand;Stand to Sit    Sit to Stand  6: Modified independent (Device/Increase time);Without upper extremity assist;From chair/3-in-1;From bed    Stand to Sit  6: Modified  independent (Device/Increase time);Without upper extremity assist;To chair/3-in-1;To bed      Ambulation/Gait   Ambulation/Gait  Yes    Ambulation/Gait Assistance  5: Supervision;4: Min guard    Ambulation/Gait Assistance Details  initiated gait with straight cane with rubber quad tip as pt tends to use his quad cane as a single point cane. Pt progressed from min guard assist to supervision. Provided pt with information on type of cane to look for and ordering information for rubber quad tip.     Ambulation Distance (Feet)  230 Feet   x2   Assistive device  Small based quad cane;Straight cane    Gait Pattern  Step-through pattern;Decreased arm swing - left;Decreased step length - left;Decreased stance time - left;Decreased dorsiflexion - left;Decreased weight shift to left;Trunk flexed   decreased left push off   Ambulation Surface  Level;Indoor    Ramp  5: Supervision    Ramp Details (indicate cue type and reason)  with straight cane with rubber quad tip    Curb  5: Supervision    Curb Details (indicate cue type and reason)  with straight cane with rubber quad tip.  Exercises   Exercises  Other Exercises    Other Exercises   reviewed and updated HEP. refer to [place] for full details.       Issued the following to HEP today: min guard for balance ex's. Cues on form/technique provided. Access Code: [email or web address]  URL: [email or web address]  Date: 05/26/2019  Prepared by: Willow Ora   Exercises Sit to/from Stand in Stride position - 10 reps - 1 sets - 1x daily - 5x weekly Walking March - 3 reps - 1 sets - 1x daily - 5x weekly Tandem Walking with Counter Support - 3 reps - 1 sets - 1x daily - 5x weekly Standing Gastroc Stretch at [employer] - 3 reps - 1 sets - 30 hold - 1x daily - 5x weekly Romberg Stance Eyes Closed on Foam Pad - 3 reps - 1 sets - 30 hold - 1x daily - 5x weekly Wide Stance with Eyes Closed and Head Rotation on Foam Pad - 10 reps - 1 sets - 1x daily - 5x  weekly Wide Stance with Eyes Closed and Head Nods on Foam Pad - 10 reps - 1 sets - 1x daily - 5x weekly       PT Education - 05/26/19 1630    Education Details  updated HEP; gait with straight cane and what to look for when buying one.    Person(s) Educated  Patient    Methods  Explanation;Demonstration;Verbal cues;Handout    Comprehension  Verbalized understanding;Returned demonstration       PT Short Term Goals - 05/26/19 1700      PT SHORT TERM GOAL #1   Title  Pt will be independent with HEP for improved strength, balance, gait.  TARGET 4 weeks, 05/13/19 (may be modified due to delayed scheduling from eval)    Baseline  05/26/19: met with current program, updated HEP today    Status  Achieved      PT SHORT TERM GOAL #2   Title  Pt will improve DGI score to at least 15/24 for decreased fall risk.    Baseline  DGI 12/24 04/11/2019 eval; 14/24 05/24/2019    Status  Partially Met      PT SHORT TERM GOAL #3   Title  Pt will improve 5x sit<>stand to less than or equal to 13 seconds for improved transfer efficiency and functional strength.    Baseline  14.47 sec at best    Status  Not Met      PT SHORT TERM GOAL #4   Title  Pt will improve TUG score to less than or equal to 15 seconds for decreased fall risk.    Baseline  15.75 sec 05/24/2019    Status  Partially Met        PT Long Term Goals - 04/11/19 1504      PT LONG TERM GOAL #1   Title  Pt will verbalize plans for progress of HPE, including continued community fitness upon d/c from PT.  TARGET 8 weeks, 06/10/2019 (may be delayed due to delayed scheduling from eval)    Time  8    Period  Weeks    Status  New      PT LONG TERM GOAL #2   Title  Pt will improve DGI to at least 19/24 for decreased fall risk.    Time  8    Period  Weeks    Status  New      PT LONG TERM GOAL #3   Title  Pt  will improve gait velocity score to at least 2.3 ft/sec for improved gait efficiency and safety.    Time  8    Period  Weeks     Status  New      PT LONG TERM GOAL #4   Title  Pt will improve TUG score to less than or equal to 13.5 seconds for decreased fall risk.    Time  8    Period  Weeks    Status  New      PT LONG TERM GOAL #5   Title  Pt will ambulate at least 1000 ft using cane/no device, modified independently, for improved independence with gait on indoor and outdoor surfaces    Time  8    Period  Weeks    Status  New      PT LONG TERM GOAL #6   Title  Pt will negotiate at least 12 steps, one handrail, step-through pattern, modified independently, for improved stair negotiation in home.    Time  8    Period  Weeks    Status  New          Plan - 05/26/19 0851    Clinical Impression Statement  Today's skilled session focused on progress toward remaining STG. Pt compliant with current HEP. With review/performance in session today his HEP was updated/advanced with no issues reported. Remainder of session focused on gait training with straight cane with rubber quad tip. Pt plans to purchase one, provided information on what to look for when getting ones/placed to go to. The pt is progressing toward goals and should benefit from continued PT to progress toward unmet goals.    Personal Factors and Comorbidities  Comorbidity 3+    Comorbidities  See PMH    Examination-Activity Limitations  Stairs;Locomotion Level;Transfers    Examination-Participation Restrictions  Community Activity;Yard Work;Meal Prep    Stability/Clinical Decision Making  Evolving/Moderate complexity    Rehab Potential  Good    Clinical Impairments Affecting Rehab Potential  good family support; independnet prior to CVA    PT Frequency  2x / week    PT Duration  8 weeks   plus eval   PT Treatment/Interventions  ADLs/Self Care Home Management;Electrical Stimulation;Therapeutic exercise;Therapeutic activities;Functional mobility training;Gait training;Stair training;DME Instruction;Balance training;Neuromuscular  re-education;Patient/family education;Orthotic Fit/Training;Manual techniques;Aquatic Therapy    PT Next Visit Plan  Work on exercises to strengthen L plantarflexors-forward lunges, stride stance sit<>stand from corner of mat, single limb heel raises, wave squats (squats>up on toes) and add to HEP as needed; gait with SPC versus no device,continuing to work on Aeronautical engineer for improved smoothness of gait    PT Home Exercise Plan  Access Code: [email or web address]    Consulted and Agree with Plan of Care  Patient       Patient will benefit from skilled therapeutic intervention in order to improve the following deficits and impairments:  Abnormal gait, Decreased activity tolerance, Decreased balance, Decreased mobility, Difficulty walking, Decreased strength, Impaired flexibility, Impaired tone  Visit Diagnosis: Muscle weakness (generalized)  Unsteadiness on feet  Other abnormalities of gait and mobility     Problem List Patient Active Problem List   Diagnosis Date Noted  . Chronic left shoulder pain 05/12/2019  . Subacromial bursitis of left shoulder joint 09/10/2018  . Spastic hemiplegia affecting nondominant side ([place]) 07/07/2018  . Abnormality of gait 06/09/2018  . Neuropathic pain   . Labile blood pressure   . PAF (paroxysmal atrial fibrillation) ([place])   . Hemiparesis affecting  left side as late effect of stroke ([place])   . Thalamic hemorrhage ([place]) 03/17/2018  . Benign essential HTN   . Dyslipidemia   . Hemorrhagic stroke ([place])   . ICH (intracerebral hemorrhage) ([place]) 03/12/2018  . Prostate cancer ([place]) 01/29/2017  . 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 Diabetes ([place]) 12/14/2015  . PCP NOTES >>>>>>>>>>>>>>>>>>>>>>>>>>>>>>. 08/06/2015  . Dizziness and giddiness 01/29/2015  . Umbilical hernia 55/21/7471  . Elevated PSA, less than 10 ng/ml 04/19/2012  . Annual physical exam 01/24/2011  . Hyperlipidemia 01/03/2010  . Essential hypertension 09/20/2007    Willow Ora, PTA, [facility] [address], [place], [place] [address] [phone] 05/27/19, 11:35 AM   Name: MAXIMILIANO CROMARTIE MRN: [id number] Date of Birth: 11-04-1945

## 2019-05-29 ENCOUNTER — Other Ambulatory Visit: Payer: Self-pay | Admitting: Physician Assistant

## 2019-05-31 ENCOUNTER — Encounter: Payer: Self-pay | Admitting: Physical Therapy

## 2019-05-31 ENCOUNTER — Ambulatory Visit: Payer: Medicare Other | Admitting: Occupational Therapy

## 2019-05-31 ENCOUNTER — Other Ambulatory Visit: Payer: Self-pay

## 2019-05-31 ENCOUNTER — Ambulatory Visit: Payer: Medicare Other | Admitting: Physical Therapy

## 2019-05-31 DIAGNOSIS — G8929 Other chronic pain: Secondary | ICD-10-CM

## 2019-05-31 DIAGNOSIS — M6281 Muscle weakness (generalized): Secondary | ICD-10-CM

## 2019-05-31 DIAGNOSIS — I69254 Hemiplegia and hemiparesis following other nontraumatic intracranial hemorrhage affecting left non-dominant side: Secondary | ICD-10-CM | POA: Diagnosis not present

## 2019-05-31 DIAGNOSIS — R278 Other lack of coordination: Secondary | ICD-10-CM

## 2019-05-31 DIAGNOSIS — R209 Unspecified disturbances of skin sensation: Secondary | ICD-10-CM | POA: Diagnosis not present

## 2019-05-31 DIAGNOSIS — M25512 Pain in left shoulder: Secondary | ICD-10-CM | POA: Diagnosis not present

## 2019-05-31 DIAGNOSIS — R2689 Other abnormalities of gait and mobility: Secondary | ICD-10-CM | POA: Diagnosis not present

## 2019-05-31 DIAGNOSIS — R2681 Unsteadiness on feet: Secondary | ICD-10-CM | POA: Diagnosis not present

## 2019-05-31 DIAGNOSIS — I69118 Other symptoms and signs involving cognitive functions following nontraumatic intracerebral hemorrhage: Secondary | ICD-10-CM | POA: Diagnosis not present

## 2019-05-31 NOTE — Therapy (Signed)
[facility] [address] [place], [place], [address] Phone: [phone]   Fax:  [phone]  Physical Therapy Treatment  Patient Details  Name: Marcus Beasley MRN: [id number] Date of Birth: 18-Jan-1946 Referring Provider (PT): Posey Pronto   Encounter Date: 05/31/2019  PT End of Session - 05/31/19 0852    Visit Number  7    Number of Visits  17    Date for PT Re-Evaluation  07/10/19    Authorization Type  [employer]HC Medicare; will need 10th visit progress note    PT Start Time  0847    PT Stop Time  0930    PT Time Calculation (min)  43 min    Equipment Utilized During Treatment  Gait belt    Activity Tolerance  Patient tolerated treatment well    Behavior During Therapy  [facility] for tasks assessed/performed       Past Medical History:  Diagnosis Date  . BPH (benign prostatic hyperplasia)    (-) Bx 2015  . Diabetes mellitus without complication ([place])   . Elevated PSA    Prostate Bx in 06/2013 was benign  . HTN (hypertension)   . Hyperlipidemia   . Macular degeneration, age related     Past Surgical History:  Procedure Laterality Date  . ABCESS DRAINAGE     abdomen- 26 day hospitalization 1968  . COLONOSCOPY  2016  . HERNIA REPAIR  summer '11   umbilical, dr Ninfa Linden   . POLYPECTOMY    . PROSTATE BIOPSY  06-2013 , 07-2017   (-), (-)    There were no vitals filed for this visit.  Subjective Assessment - 05/31/19 0852    Subjective  No new complaints, no pain or falls to report. To clinic today with new straight cane with rubber quad tip.    Patient Stated Goals  I'd like to be able towalk a little better.    Currently in Pain?  No/denies    Pain Score  0-No pain          05/31/19 0854  Transfers  Transfers Sit to Stand;Stand to Sit  Sit to Stand 6: Modified independent (Device/Increase time);Without upper extremity assist;From chair/3-in-1;From bed  Stand to Sit 6: Modified independent (Device/Increase  time);Without upper extremity assist;To chair/3-in-1;To bed  Ambulation/Gait  Ambulation/Gait Yes  Ambulation/Gait Assistance 5: Supervision;4: Min guard  Ambulation/Gait Assistance Details had pt engage in scanning all directions, speed changes, direction changes and sudden stops/starts with no balance loss noted.   Ambulation Distance (Feet) 150 Feet (x1, plus around gym)  Assistive device Straight cane (wiht rubber quad tip)  Gait Pattern Step-through pattern;Decreased arm swing - left;Decreased step length - left;Decreased stance time - left;Decreased dorsiflexion - left;Decreased weight shift to left;Trunk flexed  Ambulation Surface Level;Indoor  Exercises  Exercises Other Exercises  Other Exercises  at stairs: left forwards step ups x10 reps, left lateral step ups for 10 reps, bil heels off edge of bottom step with heel lifts up/down for 10 reps, bil UE support with cues on posture/form. seated at corner of mat in stride position with right foot forward/left foot backwards for sit<>stand x 10 reps. cues on full standing , technique.         Balance Exercises - 05/31/19 0908      Balance Exercises: Standing   Rockerboard  Anterior/posterior;Lateral;Head turns;EO;EC;30 seconds;10 reps    Balance Beam  seated with feet across red foam beam: sit<>stands with minimal UE use for 2 sets of 10  reps. cues for full upright standing, slow controlled descent.       Balance Exercises: Standing   Rebounder Limitations  performed both ways on balance board with no UE support: rocking the board with emphasis on tall posture with EO; holding the board steady for EC no head movements, progressing to EC head movements left<>right, up<>down. intermittent touch to bars for balance with min guard to min assist for balance. cues on posture and weight shifting to assist with balance.            PT Short Term Goals - 05/26/19 1700      PT SHORT TERM GOAL #1   Title  Pt will be independent with HEP  for improved strength, balance, gait.  TARGET 4 weeks, 05/13/19 (may be modified due to delayed scheduling from eval)    Baseline  05/26/19: met with current program, updated HEP today    Status  Achieved      PT SHORT TERM GOAL #2   Title  Pt will improve DGI score to at least 15/24 for decreased fall risk.    Baseline  DGI 12/24 04/11/2019 eval; 14/24 05/24/2019    Status  Partially Met      PT SHORT TERM GOAL #3   Title  Pt will improve 5x sit<>stand to less than or equal to 13 seconds for improved transfer efficiency and functional strength.    Baseline  14.47 sec at best    Status  Not Met      PT SHORT TERM GOAL #4   Title  Pt will improve TUG score to less than or equal to 15 seconds for decreased fall risk.    Baseline  15.75 sec 05/24/2019    Status  Partially Met        PT Long Term Goals - 04/11/19 1504      PT LONG TERM GOAL #1   Title  Pt will verbalize plans for progress of HPE, including continued community fitness upon d/c from PT.  TARGET 8 weeks, 06/10/2019 (may be delayed due to delayed scheduling from eval)    Time  8    Period  Weeks    Status  New      PT LONG TERM GOAL #2   Title  Pt will improve DGI to at least 19/24 for decreased fall risk.    Time  8    Period  Weeks    Status  New      PT LONG TERM GOAL #3   Title  Pt will improve gait velocity score to at least 2.3 ft/sec for improved gait efficiency and safety.    Time  8    Period  Weeks    Status  New      PT LONG TERM GOAL #4   Title  Pt will improve TUG score to less than or equal to 13.5 seconds for decreased fall risk.    Time  8    Period  Weeks    Status  New      PT LONG TERM GOAL #5   Title  Pt will ambulate at least 1000 ft using cane/no device, modified independently, for improved independence with gait on indoor and outdoor surfaces    Time  8    Period  Weeks    Status  New      PT LONG TERM GOAL #6   Title  Pt will negotiate at least 12 steps, one handrail, step-through  pattern, modified  independently, for improved stair negotiation in home.    Time  8    Period  Weeks    Status  New            Plan - 05/31/19 2395    Clinical Impression Statement  Today's skilled session continued to focus on dynamic gait with straight cane, balance reactions and strengthening. No issues were reported or noted in session. The pt is progressing toward goals and should benefit from continued PT to progress toward unmet goals.    Personal Factors and Comorbidities  Comorbidity 3+    Comorbidities  See PMH    Examination-Activity Limitations  Stairs;Locomotion Level;Transfers    Examination-Participation Restrictions  Community Activity;Yard Work;Meal Prep    Stability/Clinical Decision Making  Evolving/Moderate complexity    Rehab Potential  Good    Clinical Impairments Affecting Rehab Potential  good family support; independnet prior to CVA    PT Frequency  2x / week    PT Duration  8 weeks   plus eval   PT Treatment/Interventions  ADLs/Self Care Home Management;Electrical Stimulation;Therapeutic exercise;Therapeutic activities;Functional mobility training;Gait training;Stair training;DME Instruction;Balance training;Neuromuscular re-education;Patient/family education;Orthotic Fit/Training;Manual techniques;Aquatic Therapy    PT Next Visit Plan  Work on exercises to strengthen L plantarflexors-forward lunges, stride stance sit<>stand from corner of mat, single limb heel raises, wave squats (squats>up on toes) and add to HEP as needed; gait with SPC versus no device,continuing to work on Aeronautical engineer for improved smoothness of gait    PT Home Exercise Plan  Access Code: [email or web address]    Consulted and Agree with Plan of Care  Patient       Patient will benefit from skilled therapeutic intervention in order to improve the following deficits and impairments:  Abnormal gait, Decreased activity tolerance, Decreased balance, Decreased mobility, Difficulty walking, Decreased  strength, Impaired flexibility, Impaired tone  Visit Diagnosis: Muscle weakness (generalized)  Unsteadiness on feet  Other abnormalities of gait and mobility     Problem List Patient Active Problem List   Diagnosis Date Noted  . Chronic left shoulder pain 05/12/2019  . Subacromial bursitis of left shoulder joint 09/10/2018  . Spastic hemiplegia affecting nondominant side ([place]) 07/07/2018  . Abnormality of gait 06/09/2018  . Neuropathic pain   . Labile blood pressure   . PAF (paroxysmal atrial fibrillation) ([place])   . Hemiparesis affecting left side as late effect of stroke ([place])   . Thalamic hemorrhage ([place]) 03/17/2018  . Benign essential HTN   . Dyslipidemia   . Hemorrhagic stroke ([place])   . ICH (intracerebral hemorrhage) ([place]) 03/12/2018  . Prostate cancer ([place]) 01/29/2017  . 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 Diabetes ([place]) 12/14/2015  . PCP NOTES >>>>>>>>>>>>>>>>>>>>>>>>>>>>>>. 08/06/2015  . Dizziness and giddiness 01/29/2015  . Umbilical hernia 32/07/3341  . Elevated PSA, less than 10 ng/ml 04/19/2012  . Annual physical exam 01/24/2011  . Hyperlipidemia 01/03/2010  . Essential hypertension 09/20/2007    Willow Ora, PTA, [facility] [address] [place], [place] [address] [phone] 06/01/19, 6:56 PM   Name: Marcus Beasley MRN: [id number] Date of Birth: September 22, 1945

## 2019-05-31 NOTE — Therapy (Signed)
[facility] [address] [place] [address] [place], [place], [address] Phone: [phone]   Fax:  [phone]  Occupational Therapy Treatment  Patient Details  Name: Marcus Beasley MRN: [id number] Date of Birth: 1946/01/29 Referring Provider (OT): Delice Lesch   Encounter Date: 05/31/2019  OT End of Session - 05/31/19 1109    Visit Number  8    Number of Visits  17    Date for OT Re-Evaluation  06/11/19    Authorization Type  UHC MCR    Authorization - Visit Number  8    Authorization - Number of Visits  10    OT Start Time  0932    OT Stop Time  1017    OT Time Calculation (min)  45 min    Activity Tolerance  Patient tolerated treatment well    Behavior During Therapy  [facility] for tasks assessed/performed       Past Medical History:  Diagnosis Date  . BPH (benign prostatic hyperplasia)    (-) Bx 2015  . Diabetes mellitus without complication ([place])   . Elevated PSA    Prostate Bx in 06/2013 was benign  . HTN (hypertension)   . Hyperlipidemia   . Macular degeneration, age related     Past Surgical History:  Procedure Laterality Date  . ABCESS DRAINAGE     abdomen- 26 day hospitalization 1968  . COLONOSCOPY  2016  . HERNIA REPAIR  summer '11   umbilical, dr Ninfa Linden   . POLYPECTOMY    . PROSTATE BIOPSY  06-2013 , 07-2017   (-), (-)    There were no vitals filed for this visit.  Subjective Assessment - 05/31/19 0934    Subjective   No pain just stiffness    Pertinent History  Rt thalamic hemorrhage 03/12/18. PMH: HTN, HLD, DM, A-fib    Limitations  Fall risk    Patient Stated Goals  get more function in my Lt arm and hand    Currently in Pain?  No/denies       Reviewed safety considerations and utilizing vision due to lack of sensation LUE. Pt able to report things he should not be doing with LUE.  Discussed potential A/E needs to increase ease and safety w/ ADLS including options for tying shoes, rocker knife, and one  handed cutting board. Pt politely declined as he reports he keeps shoes tied, and does not do any cooking.  Pt practiced donning/doffing jacket in clinic and simulated UE bathing w/ LUE.   Supine: worked on shoulder flexion - pt still had pain with this at times especially coming down. Worked on proper positioning w/ shoulder flexion and then coming into greater elbow flexion w/ sh extension from chest press level. Used cones in each hand to facilitate LUE neutral shoulder and forearm and visual reminder - this worked well for maintaining proper sh rotation. Also worked on this for low level seated activities                      OT Short Term Goals - 05/31/19 1110      [place] #1   Title  Pt independent with updated LUE HEP for neuro re-education - 05/12/19    Time  4    Period  Weeks    Status  Achieved      OT SHORT TERM GOAL #2   Title  Pt independent with updated Lt hand    Time  4    Period  Weeks    Status  New      OT SHORT TERM GOAL #3   Title  Pt to verbalize understanding with safety considerations LUE d/t lack of sensation    Time  4    Period  Weeks    Status  Achieved      OT SHORT TERM GOAL #4   Title  Pt to verbalize understanding of A/E for shoelaces and cooking    Time  4    Period  Weeks    Status  Deferred   Pt shown A/E but not interested in pursuing     OT SHORT TERM GOAL #5   Title  Pt to consistently perform UE dressing (including jacket) and bathing Rt arm fully at mod I level    Baseline  min assist    Time  4    Period  Weeks    Status  Achieved      OT SHORT TERM GOAL #6   Title  Pt to achieve 90* shoulder flexion LUE w/ min compensations and pain 3/10 or under    Time  4    Period  Weeks    Status  On-going        OT Long Term Goals - 04/11/19 0906      OT LONG TERM GOAL #1   Title  Pt to improve function LUE as evidenced by performing 18 on Box & Blocks test LUE - 06/11/19    Baseline  12    Time  8     Period  Weeks    Status  New      OT LONG TERM GOAL #2   Title  Pt to improve LUE coordination as evidenced by placing 9 pegs in pegboard in 2 min. or under    Baseline  only placed 1 peg in 2 min    Time  8    Period  Weeks    Status  New      OT LONG TERM GOAL #3   Title  Pt to consistently demo mid level reaching w/ min compensations to reduce pain LUE    Time  8    Period  Weeks    Status  New      OT LONG TERM GOAL #4   Title  Pt able to tie shoelaces with extra time prn    Time  8    Period  Weeks    Status  New            Plan - 05/31/19 1112    Clinical Impression Statement  Pt has met 3 STG's and is better at controlling pain Lt shoulder when performing movement properly    Occupational Profile and client history currently impacting functional performance  PMH; HTN, HLD, DM, A-fib. Current deficits impeding pt's ability to perform ADLS, IADLS, and role as husband, friend, and leisure participation    Occupational performance deficits (Please refer to evaluation for details):  ADL's;IADL's;Leisure    Body Structure / Function / Physical Skills  ADL;Decreased knowledge of precautions;ROM;UE functional use;FMC;Decreased knowledge of use of DME;Balance;Sensation;Endurance;Pain;Strength;Coordination;IADL;Proprioception;Tone;Mobility    Cognitive Skills  Attention;Safety Awareness;Memory    Rehab Potential  Good    OT Frequency  2x / week    OT Duration  8 weeks    OT Treatment/Interventions  Self-care/ADL training;Moist Heat;DME and/or AE instruction;Splinting;Therapeutic activities;Psychosocial skills training;Aquatic Therapy;Therapeutic exercise;Cognitive remediation/compensation;Coping strategies training;Neuromuscular education;Functional Mobility Training;Passive range of motion;Visual/perceptual remediation/compensation;Manual  Therapy;Patient/family education;Electrical Stimulation    Plan  continue NMR, UBE    Consulted and Agree with Plan of Care  Patient        Patient will benefit from skilled therapeutic intervention in order to improve the following deficits and impairments:   Body Structure / Function / Physical Skills: ADL, Decreased knowledge of precautions, ROM, UE functional use, FMC, Decreased knowledge of use of DME, Balance, Sensation, Endurance, Pain, Strength, Coordination, IADL, Proprioception, Tone, Mobility Cognitive Skills: Attention, Safety Awareness, Memory     Visit Diagnosis: 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 Chronic left shoulder pain 05/12/2019  . Subacromial bursitis of left shoulder joint 09/10/2018  . Spastic hemiplegia affecting nondominant side ([place]) 07/07/2018  . Abnormality of gait 06/09/2018  . Neuropathic pain   . Labile blood pressure   . PAF (paroxysmal atrial fibrillation) ([place])   . Hemiparesis affecting left side as late effect of stroke ([place])   . Thalamic hemorrhage ([place]) 03/17/2018  . Benign essential HTN   . Dyslipidemia   . Hemorrhagic stroke ([place])   . ICH (intracerebral hemorrhage) ([place]) 03/12/2018  . Prostate cancer ([place]) 01/29/2017  . 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 Diabetes ([place]) 12/14/2015  . PCP NOTES >>>>>>>>>>>>>>>>>>>>>>>>>>>>>>. 08/06/2015  . Dizziness and giddiness 01/29/2015  . Umbilical hernia 98/33/8250  . Elevated PSA, less than 10 ng/ml 04/19/2012  . Annual physical exam 01/24/2011  . Hyperlipidemia 01/03/2010  . Essential hypertension 09/20/2007    Carey Bullocks, OTR/L 05/31/2019, 11:31 AM  [facility] [address] [place], [place], [address] Phone: [phone]   Fax:  [phone]  Name: Marcus Beasley MRN: [id number] Date of Birth: 18-Mar-1946

## 2019-06-01 ENCOUNTER — Other Ambulatory Visit: Payer: Self-pay

## 2019-06-02 ENCOUNTER — Ambulatory Visit (INDEPENDENT_AMBULATORY_CARE_PROVIDER_SITE_OTHER): Payer: Medicare Other | Admitting: Internal Medicine

## 2019-06-02 ENCOUNTER — Encounter: Payer: Self-pay | Admitting: Internal Medicine

## 2019-06-02 VITALS — BP 143/99 | HR 88 | Temp 97.0°F | Resp 18 | Ht 71.0 in | Wt 168.4 lb

## 2019-06-02 DIAGNOSIS — E785 Hyperlipidemia, unspecified: Secondary | ICD-10-CM

## 2019-06-02 DIAGNOSIS — I6930 Unspecified sequelae of cerebral infarction: Secondary | ICD-10-CM

## 2019-06-02 DIAGNOSIS — R634 Abnormal weight loss: Secondary | ICD-10-CM | POA: Diagnosis not present

## 2019-06-02 DIAGNOSIS — I10 Essential (primary) hypertension: Secondary | ICD-10-CM

## 2019-06-02 DIAGNOSIS — E1169 Type 2 diabetes mellitus with other specified complication: Secondary | ICD-10-CM

## 2019-06-02 DIAGNOSIS — I69354 Hemiplegia and hemiparesis following cerebral infarction affecting left non-dominant side: Secondary | ICD-10-CM

## 2019-06-02 LAB — COMPREHENSIVE METABOLIC PANEL
ALT: 19 U/L (ref 0–53)
AST: 15 U/L (ref 0–37)
Albumin: 4.5 g/dL (ref 3.5–5.2)
Alkaline Phosphatase: 69 U/L (ref 39–117)
BUN: 14 mg/dL (ref 6–23)
CO2: 27 mEq/L (ref 19–32)
Calcium: 9.5 mg/dL (ref 8.4–10.5)
Chloride: 104 mEq/L (ref 96–112)
Creatinine, Ser: 0.93 mg/dL (ref 0.40–1.50)
GFR: 95.9 mL/min (ref >60.00)
Glucose, Bld: 101 mg/dL — ABNORMAL HIGH (ref 70–99)
Potassium: 4.6 mEq/L (ref 3.5–5.1)
Sodium: 138 mEq/L (ref 135–145)
Total Bilirubin: 0.5 mg/dL (ref 0.2–1.2)
Total Protein: 7 g/dL (ref 6.0–8.3)

## 2019-06-02 LAB — LIPID PANEL
Cholesterol: 125 mg/dL (ref 0–200)
HDL: 43.2 mg/dL (ref >39.00)
LDL Cholesterol: 69 mg/dL (ref 0–99)
NonHDL: 81.94
Total CHOL/HDL Ratio: 3
Triglycerides: 66 mg/dL (ref 0.0–149.0)
VLDL: 13.2 mg/dL (ref 0.0–40.0)

## 2019-06-02 LAB — HEMOGLOBIN A1C: Hgb A1c MFr Bld: 6.1 % (ref 4.6–6.5)

## 2019-06-02 LAB — TSH: TSH: 0.83 u[IU]/mL (ref 0.35–4.50)

## 2019-06-02 MED ORDER — SHINGRIX 50 MCG/0.5ML IM SUSR: 0.5000 mL | Freq: Once | INTRAMUSCULAR | 0 refills | Status: AC

## 2019-06-02 NOTE — Progress Notes (Signed)
Pre visit review using our clinic review tool, if applicable. No additional management support is needed unless otherwise documented below in the visit note. 

## 2019-06-02 NOTE — Progress Notes (Signed)
Subjective:    Patient ID: Marcus Beasley, male    DOB: Mar 05, 1946, 73 y.o.   MRN: [id number]  DOS:  06/02/2019 Type of visit - description: f/u Residual left weakness from stroke: Note from neurology reviewed Patient is concerned about weight loss, he admits to a very good/healthy diet. HTN: I review his ambulatory readings they look very good.  Good med compliance DM: Good med compliance, reports ambulatory CBGs are very good, did not bring a log.  Wt Readings from Last 3 Encounters:  06/02/19 168 lb 6 oz (76.4 kg)  05/12/19 176 lb (79.8 kg)  03/30/19 177 lb (80.3 kg)   BP Readings from Last 3 Encounters:  06/02/19 (!) 143/99  05/12/19 135/87  03/30/19 124/89      Review of Systems Good compliance with anticoagulation.  Denies nausea, vomiting, diarrhea.  No blood in the stools No gross hematuria.  Also, denies any anxiety or depression.  he is embracing physical therapy and likes recuperated from the weakness left by the stroke  Past Medical History:  Diagnosis Date  . BPH (benign prostatic hyperplasia)    (-) Bx 2015  . Diabetes mellitus without complication ([place])   . Elevated PSA    Prostate Bx in 06/2013 was benign  . HTN (hypertension)   . Hyperlipidemia   . Macular degeneration, age related     Past Surgical History:  Procedure Laterality Date  . ABCESS DRAINAGE     abdomen- 26 day hospitalization 1968  . COLONOSCOPY  2016  . HERNIA REPAIR  summer '11   umbilical, dr Ninfa Linden   . POLYPECTOMY    . PROSTATE BIOPSY  06-2013 , 07-2017   (-), (-)    Social History   Socioeconomic History  . Marital status: Married    Spouse name: Not on file  . Number of children: 3  . Years of education: 73  . Highest education level: Not on file  Occupational History  . 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 Smoking status: Never Smoker  . Smokeless tobacco: Never Used  Substance and Sexual Activity  .  Alcohol use: Yes    Comment: occ. beer  . Drug use: No  . Sexual activity: Yes    Partners: Female  Other Topics Concern  . 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 2 dtrs , 1 son - homicide. 5 grandchildren.     Household: pt, wife, daughter and son   Does not drive    Social Determinants of Radio broadcast assistant Strain:   . Difficulty of Paying Living Expenses: Not on file  Food Insecurity:   . Worried About Charity fundraiser in the Last Year: Not on file  . Ran Out of Food in the Last Year: Not on file  Transportation Needs:   . Lack of Transportation (Medical): Not on file  . Lack of Transportation (Non-Medical): Not on file  Physical Activity:   . Days of Exercise per Week: Not on file  . Minutes of Exercise per Session: Not on file  Stress:   . Feeling of Stress : Not on file  Social Connections:   . Frequency of Communication with Friends and Family: Not on file  . Frequency of Social Gatherings with Friends and Family: Not on file  . Attends Religious Services: Not on file  . Active Member of Clubs or Organizations:  Not on file  . Attends Archivist Meetings: Not on file  . Marital Status: Not on file  Intimate Partner Violence:   . Fear of Current or Ex-Partner: Not on file  . Emotionally Abused: Not on file  . Physically Abused: Not on file  . Sexually Abused: Not on file      Allergies as of 06/02/2019   No Known Allergies     Medication List       Accurate as of June 02, 2019 11:59 PM. If you have any questions, ask your nurse or doctor.        amLODipine 5 MG tablet Commonly known as: NORVASC Take 1 tablet (5 mg total) by mouth daily.   apixaban 5 MG Tabs tablet Commonly known as: Eliquis Take 1 tablet (5 mg total) by mouth 2 (two) times daily.   atorvastatin 40 MG tablet Commonly known as: LIPITOR Take 1 tablet (40 mg total) by mouth daily.   diltiazem 240 MG 24 hr capsule  Commonly known as: CARDIZEM CD TAKE 1 CAPSULE BY MOUTH EVERY DAY   glucose blood test strip Commonly known as: Electronics engineer BLOOD SUGAR NO MORE THAN TWICE DAILY   meclizine 25 MG tablet Commonly known as: ANTIVERT Take 1 tablet (25 mg total) by mouth 2 (two) times daily as needed for dizziness.   metFORMIN 850 MG tablet Commonly known as: GLUCOPHAGE Take 1 tablet (850 mg total) by mouth 2 (two) times daily with a meal.   metoprolol tartrate 50 MG tablet Commonly known as: LOPRESSOR TAKE 1 TABLET BY MOUTH TWICE A DAY   multivitamin with minerals Tabs tablet Take 1 tablet by mouth daily.   pregabalin 50 MG capsule Commonly known as: Lyrica Take 1 capsule (50 mg total) by mouth 2 (two) times daily.   PRESERVISION/LUTEIN PO Take 1 tablet by mouth daily.   Shingrix injection Generic drug: Zoster Vaccine Adjuvanted Inject 0.5 mLs into the muscle once for 1 dose. Started by: Kathlene November, MD   tamsulosin 0.4 MG Caps capsule Commonly known as: FLOMAX Take 0.4 mg by mouth daily.           Objective:   Physical Exam BP (!) 143/99 (BP Location: Left Arm, Patient Position: Sitting, Cuff Size: Small)   Pulse 88   Temp (!) 97 F (36.1 C) (Temporal)   Resp 18   Ht 5\' 11"  (1.803 m)   Wt 168 lb 6 oz (76.4 kg)   SpO2 98%   BMI 23.48 kg/m  General:   Well developed, NAD, BMI noted. HEENT:  Normocephalic . Face symmetric, atraumatic Lungs:  CTA B Normal respiratory effort, no intercostal retractions, no accessory muscle use. Heart: RRR,  no murmur.  No pretibial edema bilaterally  Skin: Not pale. Not jaundice Neurologic:  alert & oriented X3.  Speech normal, gait very slow, assisted by a cane. Psych--  Cognition and judgment appear intact.  Cooperative with normal attention span and concentration.  Behavior appropriate. No anxious or depressed appearing.  Seems in good spirits     Assessment     Assessment DM  ---->  DX 07-2015, A1c 8.0; started metformin  Neuropathy, mild.  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  Hep C serologies negative, hep B surface Ab+, hep B core antibody negative (consistent with  vaccination)  PLAN DM: Reports good ambulatory CBGs , check a A1c HTN: On amlodipine, diltiazem, metoprolol.  Ambulatory BPs throughout October or November all within normal.   has 2 readings for December and they are in the 120s/85.  No change, check a CMP. Weight loss?  On reviewing the chart he is actually not losing weight but his weight does fluctuate.  Check a TSH High cholesterol: On Lipitor 40 mg, last LDL above 70.  Recheck FLP Left-sided weakness due to right thalamic hemorrhage: Last visit with physical medicine 05/12/2019.   Question if baclofen was beneficial.   They recommended to stop tramadol and they are considering amitriptyline. He was switched from gabapentin to Lyrica as d/t  intolerance to high doses of gabapentin. Shoulder pain, post stroke: Scheduling a local injection per pt Preventive care: Had Shingrix #1 last year, prescription for Shingrix No. 2 provided. RTC 4 months    This visit occurred during the SARS-CoV-2 public health emergency.  Safety protocols were in place, including screening questions prior to the visit, additional usage of staff PPE, and extensive cleaning of exam room while observing appropriate contact time as indicated for disinfecting solutions.

## 2019-06-02 NOTE — Patient Instructions (Addendum)
Please schedule Medicare Wellness with Glenard Haring.   GO TO THE LAB : Get the blood work     GO TO THE FRONT DESK Schedule your next appointment   for a checkup in 4 months   Get a dose of Shingrix at your pharmacy this is shingles shot)  Continue checking your blood pressures BP GOAL is between 110/65 and  135/85. If it is consistently higher or lower, let me know

## 2019-06-05 NOTE — Assessment & Plan Note (Signed)
DM: Reports good ambulatory CBGs , check a A1c HTN: On amlodipine, diltiazem, metoprolol.  Ambulatory BPs throughout October or November all within normal.   has 2 readings for December and they are in the 120s/85.  No change, check a CMP. Weight loss?  On reviewing the chart he is actually not losing weight but his weight does fluctuate.  Check a TSH High cholesterol: On Lipitor 40 mg, last LDL above 70.  Recheck FLP Left-sided weakness due to right thalamic hemorrhage: Last visit with physical medicine 05/12/2019.   Question if baclofen was beneficial.   They recommended to stop tramadol and they are considering amitriptyline. He was switched from gabapentin to Lyrica as d/t  intolerance to high doses of gabapentin. Shoulder pain, post stroke: Scheduling a local injection per pt Preventive care: Had Shingrix #1 last year, prescription for Shingrix No. 2 provided. RTC 4 months

## 2019-06-06 ENCOUNTER — Ambulatory Visit: Payer: Medicare Other | Admitting: Occupational Therapy

## 2019-06-06 ENCOUNTER — Other Ambulatory Visit: Payer: Self-pay

## 2019-06-06 DIAGNOSIS — R278 Other lack of coordination: Secondary | ICD-10-CM

## 2019-06-06 DIAGNOSIS — M25512 Pain in left shoulder: Secondary | ICD-10-CM | POA: Diagnosis not present

## 2019-06-06 DIAGNOSIS — R2689 Other abnormalities of gait and mobility: Secondary | ICD-10-CM | POA: Diagnosis not present

## 2019-06-06 DIAGNOSIS — R2681 Unsteadiness on feet: Secondary | ICD-10-CM | POA: Diagnosis not present

## 2019-06-06 DIAGNOSIS — R209 Unspecified disturbances of skin sensation: Secondary | ICD-10-CM | POA: Diagnosis not present

## 2019-06-06 DIAGNOSIS — I69254 Hemiplegia and hemiparesis following other nontraumatic intracranial hemorrhage affecting left non-dominant side: Secondary | ICD-10-CM | POA: Diagnosis not present

## 2019-06-06 DIAGNOSIS — G8929 Other chronic pain: Secondary | ICD-10-CM | POA: Diagnosis not present

## 2019-06-06 DIAGNOSIS — I69118 Other symptoms and signs involving cognitive functions following nontraumatic intracerebral hemorrhage: Secondary | ICD-10-CM | POA: Diagnosis not present

## 2019-06-06 DIAGNOSIS — M6281 Muscle weakness (generalized): Secondary | ICD-10-CM | POA: Diagnosis not present

## 2019-06-06 NOTE — Therapy (Signed)
[facility] [address] [place], [place], [address] Phone: [phone]   Fax:  [phone]  Occupational Therapy Treatment  Patient Details  Name: Marcus Beasley MRN: [id number] Date of Birth: Mar 12, 1946 Referring Provider (OT): Delice Lesch   Encounter Date: 06/06/2019  OT End of Session - 06/06/19 0927    Visit Number  9    Number of Visits  17    Date for OT Re-Evaluation  06/11/19    Authorization Type  UHC MCR    Authorization - Visit Number  9    Authorization - Number of Visits  10    OT Start Time  0850    OT Stop Time  0932    OT Time Calculation (min)  42 min    Activity Tolerance  Patient tolerated treatment well    Behavior During Therapy  [facility] for tasks assessed/performed       Past Medical History:  Diagnosis Date  . BPH (benign prostatic hyperplasia)    (-) Bx 2015  . Diabetes mellitus without complication ([place])   . Elevated PSA    Prostate Bx in 06/2013 was benign  . HTN (hypertension)   . Hyperlipidemia   . Macular degeneration, age related     Past Surgical History:  Procedure Laterality Date  . ABCESS DRAINAGE     abdomen- 26 day hospitalization 1968  . COLONOSCOPY  2016  . HERNIA REPAIR  summer '11   umbilical, dr Ninfa Linden   . POLYPECTOMY    . PROSTATE BIOPSY  06-2013 , 07-2017   (-), (-)    There were no vitals filed for this visit.  Subjective Assessment - 06/06/19 0853    Pertinent History  Rt thalamic hemorrhage 03/12/18. PMH: HTN, HLD, DM, A-fib    Limitations  Fall risk    Patient Stated Goals  get more function in my Lt arm and hand    Currently in Pain?  No/denies       LUE closed chain mid to high level flexion using UE Ranger and min cues to prevent compensation Lt shoulder. Followed by higher stretching using UE Ranger. BUE A/ROM in low to mid level reaching to front and reaching to sides w/ trunk rotation. Progressed to low level open chain reaching LUE only, while  focusing on graded pinch and graded grasp using blocks and cups. Pt successfully able to perform 2 pt pinch to pick up blocks and place in cups, however max difficulty and drops using 3 pt pinch d/t decreased control and ability to coordination both 2nd and 3rd digits together. Pt also practiced graded grasp picking up and replacing cups. Pt instructed to practice at home.  UBE x 8 min. Level 1 for reciprocal movement pattern.                       OT Short Term Goals - 05/31/19 1110      OT SHORT TERM GOAL #1   Title  Pt independent with updated LUE HEP for neuro re-education - 05/12/19    Time  4    Period  Weeks    Status  Achieved      OT SHORT TERM GOAL #2   Title  Pt independent with updated Lt hand    Time  4    Period  Weeks    Status  New      OT SHORT TERM GOAL #3   Title  Pt to verbalize  understanding with safety considerations LUE d/t lack of sensation    Time  4    Period  Weeks    Status  Achieved      OT SHORT TERM GOAL #4   Title  Pt to verbalize understanding of A/E for shoelaces and cooking    Time  4    Period  Weeks    Status  Deferred   Pt shown A/E but not interested in pursuing     OT SHORT TERM GOAL #5   Title  Pt to consistently perform UE dressing (including jacket) and bathing Rt arm fully at mod I level    Baseline  min assist    Time  4    Period  Weeks    Status  Achieved      OT SHORT TERM GOAL #6   Title  Pt to achieve 90* shoulder flexion LUE w/ min compensations and pain 3/10 or under    Time  4    Period  Weeks    Status  On-going        OT Long Term Goals - 04/11/19 0906      OT LONG TERM GOAL #1   Title  Pt to improve function LUE as evidenced by performing 18 on Box & Blocks test LUE - 06/11/19    Baseline  12    Time  8    Period  Weeks    Status  New      OT LONG TERM GOAL #2   Title  Pt to improve LUE coordination as evidenced by placing 9 pegs in pegboard in 2 min. or under    Baseline  only placed  1 peg in 2 min    Time  8    Period  Weeks    Status  New      OT LONG TERM GOAL #3   Title  Pt to consistently demo mid level reaching w/ min compensations to reduce pain LUE    Time  8    Period  Weeks    Status  New      OT LONG TERM GOAL #4   Title  Pt able to tie shoelaces with extra time prn    Time  8    Period  Weeks    Status  New            Plan - 06/06/19 [id number]    Clinical Impression Statement  Pt progressing w/ low level LUE reaching, but demo decreased ability to grade grasp/pinch Lt hand due to decreased sensation    Occupational Profile and client history currently impacting functional performance  PMH; HTN, HLD, DM, A-fib. Current deficits impeding pt's ability to perform ADLS, IADLS, and role as husband, friend, and leisure participation    Occupational performance deficits (Please refer to evaluation for details):  ADL's;IADL's;Leisure    Body Structure / Function / Physical Skills  ADL;Decreased knowledge of precautions;ROM;UE functional use;FMC;Decreased knowledge of use of DME;Balance;Sensation;Endurance;Pain;Strength;Coordination;IADL;Proprioception;Tone;Mobility    Cognitive Skills  Attention;Safety Awareness;Memory    Rehab Potential  Good    OT Frequency  2x / week    OT Duration  8 weeks    OT Treatment/Interventions  Self-care/ADL training;Moist Heat;DME and/or AE instruction;Splinting;Therapeutic activities;Psychosocial skills training;Aquatic Therapy;Therapeutic exercise;Cognitive remediation/compensation;Coping strategies training;Neuromuscular education;Functional Mobility Training;Passive range of motion;Visual/perceptual remediation/compensation;Manual Therapy;Patient/family education;Electrical Stimulation    Plan  10th progress note, check remaining STG's, continue NMR LUE, UBE    Consulted and Agree with Plan of  Care  Patient       Patient will benefit from skilled therapeutic intervention in order to improve the following deficits and  impairments:   Body Structure / Function / Physical Skills: ADL, Decreased knowledge of precautions, ROM, UE functional use, FMC, Decreased knowledge of use of DME, Balance, Sensation, Endurance, Pain, Strength, Coordination, IADL, Proprioception, Tone, Mobility Cognitive Skills: Attention, Safety Awareness, Memory     Visit Diagnosis: 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 Chronic left shoulder pain 05/12/2019  . Subacromial bursitis of left shoulder joint 09/10/2018  . Spastic hemiplegia affecting nondominant side ([place]) 07/07/2018  . Abnormality of gait 06/09/2018  . Neuropathic pain   . Labile blood pressure   . PAF (paroxysmal atrial fibrillation) ([place])   . Hemiparesis affecting left side as late effect of stroke ([place])   . Thalamic hemorrhage ([place]) 03/17/2018  . Benign essential HTN   . Dyslipidemia   . Hemorrhagic stroke ([place])   . ICH (intracerebral hemorrhage) ([place]) 03/12/2018  . Prostate cancer ([place]) 01/29/2017  . 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 Diabetes ([place]) 12/14/2015  . PCP NOTES >>>>>>>>>>>>>>>>>>>>>>>>>>>>>>. 08/06/2015  . Dizziness and giddiness 01/29/2015  . Umbilical hernia [id number]  . Elevated PSA, less than 10 ng/ml 04/19/2012  . Annual physical exam 01/24/2011  . Hyperlipidemia 01/03/2010  . Essential hypertension 09/20/2007    Carey Bullocks, OTR/L 06/06/2019, 9:30 AM  [facility] [address] [place], [place], [address] Phone: [phone]   Fax:  [phone]  Name: Marcus Beasley MRN: [id number] Date of Birth: 1946-02-06

## 2019-06-07 ENCOUNTER — Encounter: Payer: Medicare Other | Attending: Physical Medicine & Rehabilitation | Admitting: Physical Medicine & Rehabilitation

## 2019-06-07 ENCOUNTER — Other Ambulatory Visit: Payer: Self-pay

## 2019-06-07 ENCOUNTER — Encounter: Payer: Self-pay | Admitting: Physical Medicine & Rehabilitation

## 2019-06-07 VITALS — BP 131/95 | HR 80 | Temp 97.7°F | Ht 71.0 in | Wt 170.0 lb

## 2019-06-07 DIAGNOSIS — M7552 Bursitis of left shoulder: Secondary | ICD-10-CM

## 2019-06-07 DIAGNOSIS — E119 Type 2 diabetes mellitus without complications: Secondary | ICD-10-CM | POA: Insufficient documentation

## 2019-06-07 DIAGNOSIS — I10 Essential (primary) hypertension: Secondary | ICD-10-CM | POA: Insufficient documentation

## 2019-06-07 MED ORDER — PREGABALIN 50 MG PO CAPS: 50.0000 mg | ORAL_CAPSULE | Freq: Two times a day (BID) | ORAL | 1 refills | Status: DC

## 2019-06-07 NOTE — Addendum Note (Signed)
Addended by: Delice Lesch A on: 06/07/2019 10:58 AM   Modules accepted: Orders

## 2019-06-07 NOTE — Progress Notes (Signed)
Ultrasound guided subacromial left shoulder injection  Indication:Shoulder pain not relieved by medication management and other conservative care.  Informed consent was obtained after describing risks and benefits of the procedure with the patient, this includes bleeding, bruising, infection and medication side effects. The patient wishes to proceed and has given written consent. Patient was placed in a seated position with the ipsilateral hand placed in a gravity dependent position. The left shoulder was marked and prepped with betadine in the subacromial area. Using ultrasound tranducer was placed inferior and transverse to the acromian. The subacromial bursa space was visualized.  Vapocoolant spray was applied.  A 22-gauge 2 inch needle was inserted into the subacromial area under with visualization until the needle was in the space. After negative draw back for blood, a solution containing 1 mL of 6 mg per ML betamethasone and 4 mL of 1% lidocaine was injected. The patient tolerated the procedure well. Post procedure instructions were given.

## 2019-06-10 ENCOUNTER — Ambulatory Visit: Payer: Medicare Other | Admitting: Physical Therapy

## 2019-06-10 ENCOUNTER — Other Ambulatory Visit: Payer: Self-pay | Admitting: Internal Medicine

## 2019-06-10 NOTE — Telephone Encounter (Signed)
Last OV 06/02/19 Last refill 12/27/18 # 180/1 Next OV 10/06/19

## 2019-06-13 ENCOUNTER — Ambulatory Visit: Payer: Medicare Other | Admitting: Occupational Therapy

## 2019-06-13 ENCOUNTER — Other Ambulatory Visit: Payer: Self-pay | Admitting: Physical Medicine & Rehabilitation

## 2019-06-13 ENCOUNTER — Other Ambulatory Visit: Payer: Self-pay

## 2019-06-13 DIAGNOSIS — M6281 Muscle weakness (generalized): Secondary | ICD-10-CM | POA: Diagnosis not present

## 2019-06-13 DIAGNOSIS — R209 Unspecified disturbances of skin sensation: Secondary | ICD-10-CM | POA: Diagnosis not present

## 2019-06-13 DIAGNOSIS — I69254 Hemiplegia and hemiparesis following other nontraumatic intracranial hemorrhage affecting left non-dominant side: Secondary | ICD-10-CM

## 2019-06-13 DIAGNOSIS — R278 Other lack of coordination: Secondary | ICD-10-CM | POA: Diagnosis not present

## 2019-06-13 DIAGNOSIS — R2689 Other abnormalities of gait and mobility: Secondary | ICD-10-CM | POA: Diagnosis not present

## 2019-06-13 DIAGNOSIS — M25512 Pain in left shoulder: Secondary | ICD-10-CM | POA: Diagnosis not present

## 2019-06-13 DIAGNOSIS — G8929 Other chronic pain: Secondary | ICD-10-CM | POA: Diagnosis not present

## 2019-06-13 DIAGNOSIS — I69118 Other symptoms and signs involving cognitive functions following nontraumatic intracerebral hemorrhage: Secondary | ICD-10-CM | POA: Diagnosis not present

## 2019-06-13 DIAGNOSIS — R2681 Unsteadiness on feet: Secondary | ICD-10-CM

## 2019-06-13 NOTE — Therapy (Signed)
[facility] [address] [place], [place], [address] Phone: [phone]   Fax:  [phone]  Occupational Therapy Treatment  Patient Details  Name: Marcus Beasley MRN: [id number] Date of Birth: 1946-02-08 Referring Provider (OT): Delice Lesch   Encounter Date: 06/13/2019  OT End of Session - 06/13/19 0925    Visit Number  10    Number of Visits  17    Date for OT Re-Evaluation  06/11/19    Authorization Type  UHC MCR    Authorization - Visit Number  10    Authorization - Number of Visits  10    OT Start Time  0845    OT Stop Time  0930    OT Time Calculation (min)  45 min    Activity Tolerance  Patient tolerated treatment well    Behavior During Therapy  [facility] for tasks assessed/performed       Past Medical History:  Diagnosis Date  . BPH (benign prostatic hyperplasia)    (-) Bx 2015  . Diabetes mellitus without complication ([place])   . Elevated PSA    Prostate Bx in 06/2013 was benign  . HTN (hypertension)   . Hyperlipidemia   . Macular degeneration, age related     Past Surgical History:  Procedure Laterality Date  . ABCESS DRAINAGE     abdomen- 26 day hospitalization 1968  . COLONOSCOPY  2016  . HERNIA REPAIR  summer '11   umbilical, dr Ninfa Linden   . POLYPECTOMY    . PROSTATE BIOPSY  06-2013 , 07-2017   (-), (-)    There were no vitals filed for this visit.  Subjective Assessment - 06/13/19 0852    Subjective   No pain just stiffness. I only have pain if I go higher w/ my arm    Pertinent History  Rt thalamic hemorrhage 03/12/18. PMH: HTN, HLD, DM, A-fib    Limitations  Fall risk    Patient Stated Goals  get more function in my Lt arm and hand    Currently in Pain?  No/denies       Supine: trunk twist/stretch bilaterally, followed by lats stretch seated w/ arms resting at 80* flexion on table while performing lateral trunk flexion bilaterally. Then performed trunk rotation bilaterally.  Seated:  low level open chain reaching LUE to place checkers into connect 4 w/ focus on forearm rotation w/o compensating into shoulder IR. Also worked on graded grasp for picking up and stacking cones and placing to Lt side and back w/ min facilitation prn UBE x 10 min, level 1 for reciprocal movement pattern                      OT Short Term Goals - 06/13/19 0926      OT SHORT TERM GOAL #1   Title  Pt independent with updated LUE HEP for neuro re-education - 05/12/19    Time  4    Period  Weeks    Status  Achieved      OT SHORT TERM GOAL #2   Title  Pt independent with updated Lt hand    Time  4    Period  Weeks    Status  Achieved      OT SHORT TERM GOAL #3   Title  Pt to verbalize understanding with safety considerations LUE d/t lack of sensation    Time  4    Period  Weeks    Status  Achieved      OT SHORT TERM GOAL #4   Title  Pt to verbalize understanding of A/E for shoelaces and cooking    Time  4    Period  Weeks    Status  Deferred   Pt shown A/E but not interested in pursuing     OT SHORT TERM GOAL #5   Title  Pt to consistently perform UE dressing (including jacket) and bathing Rt arm fully at mod I level    Baseline  min assist    Time  4    Period  Weeks    Status  Achieved      OT SHORT TERM GOAL #6   Title  Pt to achieve 90* shoulder flexion LUE w/ min compensations and pain 3/10 or under    Time  4    Period  Weeks    Status  On-going   approx 70*       OT Long Term Goals - 04/11/19 0906      OT LONG TERM GOAL #1   Title  Pt to improve function LUE as evidenced by performing 18 on [employer] test LUE - 06/11/19    Baseline  12    Time  8    Period  Weeks    Status  New      OT LONG TERM GOAL #2   Title  Pt to improve LUE coordination as evidenced by placing 9 pegs in pegboard in 2 min. or under    Baseline  only placed 1 peg in 2 min    Time  8    Period  Weeks    Status  New      OT LONG TERM GOAL #3   Title  Pt to  consistently demo mid level reaching w/ min compensations to reduce pain LUE    Time  8    Period  Weeks    Status  New      OT LONG TERM GOAL #4   Title  Pt able to tie shoelaces with extra time prn    Time  8    Period  Weeks    Status  New            Plan - 06/13/19 0927    Clinical Impression Statement  This 10th progress note is for dates 04/11/19 - 06/13/19: pt has met 3/5 STG's. Pt progressing with LUE function in low ranges w/ less overall pain and less compensations.    Occupational Profile and client history currently impacting functional performance  PMH; HTN, HLD, DM, A-fib. Current deficits impeding pt's ability to perform ADLS, IADLS, and role as husband, friend, and leisure participation    Occupational performance deficits (Please refer to evaluation for details):  ADL's;IADL's;Leisure    Body Structure / Function / Physical Skills  ADL;Decreased knowledge of precautions;ROM;UE functional use;FMC;Decreased knowledge of use of DME;Balance;Sensation;Endurance;Pain;Strength;Coordination;IADL;Proprioception;Tone;Mobility    Cognitive Skills  Attention;Safety Awareness;Memory    Rehab Potential  Good    OT Frequency  2x / week    OT Duration  8 weeks    OT Treatment/Interventions  Self-care/ADL training;Moist Heat;DME and/or AE instruction;Splinting;Therapeutic activities;Psychosocial skills training;Aquatic Therapy;Therapeutic exercise;Cognitive remediation/compensation;Coping strategies training;Neuromuscular education;Functional Mobility Training;Passive range of motion;Visual/perceptual remediation/compensation;Manual Therapy;Patient/family education;Electrical Stimulation    Plan  check remaining STG, continue NMR, UBE    Consulted and Agree with Plan of Care  Patient       Patient will benefit from skilled therapeutic intervention in  order to improve the following deficits and impairments:   Body Structure / Function / Physical Skills: ADL, Decreased knowledge of  precautions, ROM, UE functional use, FMC, Decreased knowledge of use of DME, Balance, Sensation, Endurance, Pain, Strength, Coordination, IADL, Proprioception, Tone, Mobility Cognitive Skills: Attention, Safety Awareness, Memory     Visit Diagnosis: 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 Chronic left shoulder pain 05/12/2019  . Subacromial bursitis of left shoulder joint 09/10/2018  . Spastic hemiplegia affecting nondominant side ([place]) 07/07/2018  . Abnormality of gait 06/09/2018  . Neuropathic pain   . Labile blood pressure   . PAF (paroxysmal atrial fibrillation) ([place])   . Hemiparesis affecting left side as late effect of stroke ([place])   . Thalamic hemorrhage ([place]) 03/17/2018  . Benign essential HTN   . Dyslipidemia   . Hemorrhagic stroke ([place])   . ICH (intracerebral hemorrhage) ([place]) 03/12/2018  . Prostate cancer ([place]) 01/29/2017  . 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 Diabetes ([place]) 12/14/2015  . PCP NOTES >>>>>>>>>>>>>>>>>>>>>>>>>>>>>>. 08/06/2015  . Dizziness and giddiness 01/29/2015  . Umbilical hernia 86/82/5749  . Elevated PSA, less than 10 ng/ml 04/19/2012  . Annual physical exam 01/24/2011  . Hyperlipidemia 01/03/2010  . Essential hypertension 09/20/2007    Carey Bullocks, OTR/L 06/13/2019, 10:30 AM  [facility] [address] [place], [place], [address] Phone: [phone]   Fax:  [phone]  Name: Marcus Beasley MRN: [id number] Date of Birth: 1945-12-18

## 2019-06-15 ENCOUNTER — Ambulatory Visit: Payer: Medicare Other | Admitting: Occupational Therapy

## 2019-06-15 ENCOUNTER — Encounter: Payer: Self-pay | Admitting: Physical Therapy

## 2019-06-15 ENCOUNTER — Other Ambulatory Visit: Payer: Self-pay

## 2019-06-15 ENCOUNTER — Ambulatory Visit: Payer: Medicare Other | Admitting: Physical Therapy

## 2019-06-15 DIAGNOSIS — M25512 Pain in left shoulder: Secondary | ICD-10-CM | POA: Diagnosis not present

## 2019-06-15 DIAGNOSIS — G8929 Other chronic pain: Secondary | ICD-10-CM | POA: Diagnosis not present

## 2019-06-15 DIAGNOSIS — R2681 Unsteadiness on feet: Secondary | ICD-10-CM

## 2019-06-15 DIAGNOSIS — I69254 Hemiplegia and hemiparesis following other nontraumatic intracranial hemorrhage affecting left non-dominant side: Secondary | ICD-10-CM | POA: Diagnosis not present

## 2019-06-15 DIAGNOSIS — M6281 Muscle weakness (generalized): Secondary | ICD-10-CM | POA: Diagnosis not present

## 2019-06-15 DIAGNOSIS — R209 Unspecified disturbances of skin sensation: Secondary | ICD-10-CM | POA: Diagnosis not present

## 2019-06-15 DIAGNOSIS — R2689 Other abnormalities of gait and mobility: Secondary | ICD-10-CM | POA: Diagnosis not present

## 2019-06-15 DIAGNOSIS — R278 Other lack of coordination: Secondary | ICD-10-CM | POA: Diagnosis not present

## 2019-06-15 DIAGNOSIS — I69118 Other symptoms and signs involving cognitive functions following nontraumatic intracerebral hemorrhage: Secondary | ICD-10-CM | POA: Diagnosis not present

## 2019-06-15 NOTE — Therapy (Signed)
[facility] [address] [place], [place], [address] Phone: [phone]   Fax:  [phone]  Physical Therapy Treatment  Patient Details  Name: Marcus Beasley MRN: [id number] Date of Birth: 1945/11/18 Referring Provider (PT): Posey Pronto   Encounter Date: 06/15/2019  PT End of Session - 06/15/19 1106    Visit Number  8    Number of Visits  17    Date for PT Re-Evaluation  07/10/19    Authorization Type  UHC Medicare; will need 10th visit progress note    PT Start Time  0932    PT Stop Time  1015    PT Time Calculation (min)  43 min    Activity Tolerance  Patient tolerated treatment well    Behavior During Therapy  [facility] for tasks assessed/performed       Past Medical History:  Diagnosis Date  . BPH (benign prostatic hyperplasia)    (-) Bx 2015  . Diabetes mellitus without complication ([place])   . Elevated PSA    Prostate Bx in 06/2013 was benign  . HTN (hypertension)   . Hyperlipidemia   . Macular degeneration, age related     Past Surgical History:  Procedure Laterality Date  . ABCESS DRAINAGE     abdomen- 26 day hospitalization 1968  . COLONOSCOPY  2016  . HERNIA REPAIR  summer '11   umbilical, dr Ninfa Linden   . POLYPECTOMY    . PROSTATE BIOPSY  06-2013 , 07-2017   (-), (-)    There were no vitals filed for this visit.  Subjective Assessment - 06/15/19 1047    Subjective  Pt denies any falls or changes.  Asking about finishing up with Physical Therapy.  Feels like he needs OT for his hand more than PT.    Patient Stated Goals  I'd like to be able towalk a little better.    Currently in Pain?  No/denies                       [facility] Adult PT Treatment/Exercise - 06/15/19 0001      Transfers   Transfers  Sit to Stand;Stand to Sit    Sit to Stand  6: Modified independent (Device/Increase time);Without upper extremity assist;From chair/3-in-1;From bed    Stand to Sit  6: Modified independent  (Device/Increase time);Without upper extremity assist;To chair/3-in-1;To bed      Ambulation/Gait   Ambulation/Gait  Yes    Ambulation/Gait Assistance  6: Modified independent (Device/Increase time)    Ambulation Distance (Feet)  115 Feet   x 4 plus during activities in gym   Assistive device  Straight cane;Other (Comment)   with rubber quad tip   Gait Pattern  Step-through pattern;Decreased arm swing - left;Decreased step length - left;Decreased stance time - left;Decreased dorsiflexion - left;Decreased weight shift to left;Trunk flexed    Ambulation Surface  Level;Indoor    Gait velocity  11.91 sec with quad tip cane (2.75 ft/sec)    Stairs  Yes    Stairs Assistance  6: Modified independent (Device/Increase time)    Stairs Assistance Details (indicate cue type and reason)  rail on L side going up    Stair Management Technique  One rail Left;With cane;Forwards;Alternating pattern    Number of Stairs  12    Height of Stairs  6      Standardized Balance Assessment   Standardized Balance Assessment  Timed Up and Go Test  Dynamic Gait Index   Level Surface  Mild Impairment    Change in Gait Speed  Mild Impairment    Gait with Horizontal Head Turns  Mild Impairment    Gait with Vertical Head Turns  Mild Impairment    Gait and Pivot Turn  Normal    Step Over Obstacle  Mild Impairment    Step Around Obstacles  Normal    Steps  Mild Impairment    Total Score  18      Timed Up and Go Test   TUG  Normal TUG    Normal TUG (seconds)  11.75   with quad tip cane;12.0 sec without device     Neuro Re-ed    Neuro Re-ed Details   Corner balance on pillow with eyes closed static x 10 sec, head nods x 10, head turns x 10.  Performed with wide and narrow BOS.  Needing UE assist at times to recover balance.      Exercises   Exercises  Knee/Hip    Other Exercises   Sit<>stand staggered stance x 10, standing gastroc stretch/lunge at counter, marching x 10 at counter, tandem gait forward and  backwards at counter x 8' x 4 reps      Knee/Hip Exercises: Aerobic   Other Aerobic  Scifit UE/LE level 3.5 for 7 minutes with goal of 60 rpm for strengthening and activity tolerance             PT Education - 06/15/19 1048    Education Details  Progress toward goals, PTA to f/u with primary PT concerning additional visits    Person(s) Educated  Patient    Methods  Explanation    Comprehension  Verbalized understanding       PT Short Term Goals - 05/26/19 1700      PT SHORT TERM GOAL #1   Title  Pt will be independent with HEP for improved strength, balance, gait.  TARGET 4 weeks, 05/13/19 (may be modified due to delayed scheduling from eval)    Baseline  05/26/19: met with current program, updated HEP today    Status  Achieved      PT SHORT TERM GOAL #2   Title  Pt will improve DGI score to at least 15/24 for decreased fall risk.    Baseline  DGI 12/24 04/11/2019 eval; 14/24 05/24/2019    Status  Partially Met      PT SHORT TERM GOAL #3   Title  Pt will improve 5x sit<>stand to less than or equal to 13 seconds for improved transfer efficiency and functional strength.    Baseline  14.47 sec at best    Status  Not Met      PT SHORT TERM GOAL #4   Title  Pt will improve TUG score to less than or equal to 15 seconds for decreased fall risk.    Baseline  15.75 sec 05/24/2019    Status  Partially Met        PT Long Term Goals - 06/15/19 1049      PT LONG TERM GOAL #1   Title  Pt will verbalize plans for progress of HPE, including continued community fitness upon d/c from PT.  TARGET 8 weeks, 06/10/2019 (may be delayed due to delayed scheduling from eval)    Time  8    Period  Weeks    Status  Achieved      PT LONG TERM GOAL #2   Title  Pt will improve  DGI to at least 19/24 for decreased fall risk.    Baseline  18/24 on 06/15/19    Time  8    Period  Weeks    Status  Not Met      PT LONG TERM GOAL #3   Title  Pt will improve gait velocity score to at least 2.3  ft/sec for improved gait efficiency and safety.    Baseline  2.75 ft/secon with quad tip cane on 06/15/19    Time  8    Period  Weeks    Status  Achieved      PT LONG TERM GOAL #4   Title  Pt will improve TUG score to less than or equal to 13.5 seconds for decreased fall risk.    Baseline  11.75 sec with quad tip straight cane on 06/15/19    Time  8    Period  Weeks    Status  Achieved      PT LONG TERM GOAL #5   Title  Pt will ambulate at least 1000 ft using cane/no device, modified independently, for improved independence with gait on indoor and outdoor surfaces    Baseline  unable to check on 06/15/19 due to weather    Time  8    Period  Weeks    Status  Unable to assess      PT LONG TERM GOAL #6   Title  Pt will negotiate at least 12 steps, one handrail, step-through pattern, modified independently, for improved stair negotiation in home.    Time  8    Period  Weeks    Status  Achieved            Plan - 06/15/19 1059    Clinical Impression Statement  Pt met LTG 1,3,4 and 6.  LTG 2 not met.  LTG 5 not assesssed due to weather.  Pt asking about wrapping up with physical therapy.  Discussed with Mady Haagensen, PT, who would like to have pt continue with PT through his POC which is 2 more weeks.  Will have front office call pt to schedule additional visits if he is willing.    Personal Factors and Comorbidities  Comorbidity 3+    Comorbidities  See PMH    Examination-Activity Limitations  Stairs;Locomotion Level;Transfers    Examination-Participation Restrictions  Community Activity;Yard Work;Meal Prep    Stability/Clinical Decision Making  Evolving/Moderate complexity    Rehab Potential  Good    Clinical Impairments Affecting Rehab Potential  good family support; independnet prior to CVA    PT Frequency  2x / week    PT Duration  8 weeks   plus eval   PT Treatment/Interventions  ADLs/Self Care Home Management;Electrical Stimulation;Therapeutic exercise;Therapeutic  activities;Functional mobility training;Gait training;Stair training;DME Instruction;Balance training;Neuromuscular re-education;Patient/family education;Orthotic Fit/Training;Manual techniques;Aquatic Therapy    PT Next Visit Plan  Have front office call pt to schedule 2 additional weeks of PT 2x/week per Mady Haagensen, PT.  Work on exercises to strengthen L plantarflexors-forward lunges, stride stance sit<>stand from corner of mat, single limb heel raises, wave squats (squats>up on toes) and add to HEP as needed; gait with SPC versus no device,continuing to work on Aeronautical engineer for improved smoothness of gait    PT Home Exercise Plan  Access Code: [email or web address]    Consulted and Agree with Plan of Care  Patient       Patient will benefit from skilled therapeutic intervention in order to improve the following deficits and impairments:  Abnormal gait, Decreased activity tolerance, Decreased balance, Decreased mobility, Difficulty walking, Decreased strength, Impaired flexibility, Impaired tone  Visit Diagnosis: Muscle weakness (generalized)  Unsteadiness on feet  Other abnormalities of gait and mobility     Problem List Patient Active Problem List   Diagnosis Date Noted  . Chronic left shoulder pain 05/12/2019  . Subacromial bursitis of left shoulder joint 09/10/2018  . Spastic hemiplegia affecting nondominant side ([place]) 07/07/2018  . Abnormality of gait 06/09/2018  . Neuropathic pain   . Labile blood pressure   . PAF (paroxysmal atrial fibrillation) ([place])   . Hemiparesis affecting left side as late effect of stroke ([place])   . Thalamic hemorrhage ([place]) 03/17/2018  . Benign essential HTN   . Dyslipidemia   . Hemorrhagic stroke ([place])   . ICH (intracerebral hemorrhage) ([place]) 03/12/2018  . Prostate cancer ([place]) 01/29/2017  . 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 Diabetes ([place]) 12/14/2015  . PCP NOTES >>>>>>>>>>>>>>>>>>>>>>>>>>>>>>. 08/06/2015  . Dizziness and giddiness  01/29/2015  . Umbilical hernia 42/68/3419  . Elevated PSA, less than 10 ng/ml 04/19/2012  . Annual physical exam 01/24/2011  . Hyperlipidemia 01/03/2010  . Essential hypertension 09/20/2007    Narda Bonds, PTA [place] 06/15/19 4:56 PM Phone: [phone] Fax: [facility] [address] [place], [place], [address] Phone: [phone]   Fax:  [phone]  Name: Marcus Beasley MRN: [id number] Date of Birth: [birth year]

## 2019-06-15 NOTE — Therapy (Signed)
[facility] [address] [place], [place], [address] Phone: [phone]   Fax:  [phone]  Occupational Therapy Treatment  Patient Details  Name: Marcus Beasley MRN: [id number] Date of Birth: 22-May-1946 Referring Provider (OT): Delice Lesch   Encounter Date: 06/15/2019  OT End of Session - 06/15/19 1111    Visit Number  11    Number of Visits  17    Date for OT Re-Evaluation  07/22/19    Authorization Type  [employer] MCR    Authorization - Visit Number  11    Authorization - Number of Visits  20    OT Start Time  7017    OT Stop Time  1100    OT Time Calculation (min)  42 min    Activity Tolerance  Patient tolerated treatment well    Behavior During Therapy  [facility] for tasks assessed/performed       Past Medical History:  Diagnosis Date  . BPH (benign prostatic hyperplasia)    (-) Bx 2015  . Diabetes mellitus without complication ([place])   . Elevated PSA    Prostate Bx in 06/2013 was benign  . HTN (hypertension)   . Hyperlipidemia   . Macular degeneration, age related     Past Surgical History:  Procedure Laterality Date  . ABCESS DRAINAGE     abdomen- 26 day hospitalization 1968  . COLONOSCOPY  2016  . HERNIA REPAIR  summer '11   umbilical, dr Ninfa Linden   . POLYPECTOMY    . PROSTATE BIOPSY  06-2013 , 07-2017   (-), (-)    There were no vitals filed for this visit.  Subjective Assessment - 06/15/19 1114    Subjective   No pain just stiffness. I only have pain if I go higher w/ my arm    Pertinent History  Rt thalamic hemorrhage 03/12/18. PMH: HTN, HLD, DM, A-fib    Limitations  Fall risk    Patient Stated Goals  get more function in my Lt arm and hand    Currently in Pain?  Yes    Pain Score  3     Pain Location  Shoulder    Pain Orientation  Left    Pain Descriptors / Indicators  Aching    Pain Onset  More than a month ago    Pain Frequency  Intermittent    Aggravating Factors   malpositioning    Pain  Relieving Factors  repositioning           Treatment: Supine closed chain chest press and shoulder flexion 15 reps each, min -mod facilitation/ v.c Quadraped cat/ cow position with mod-max facilitation attempted rocking forwards and backwards in quadraped, however pt experienced pain, so activity was discontinued. Seated closed chain shoulder flexion, min-mod facilitation/ v.c Therapist checked progress towards long term goals. See long term goals for updates. Pt is only scheduled for 2 additional visits after today. Pt to decide whether to continue vs.discharge next visit.                   OT Short Term Goals - 06/15/19 1057      OT SHORT TERM GOAL #1   Title  Pt independent with updated LUE HEP for neuro re-education - 05/12/19    Time  --    Period  Weeks    Status  Achieved      OT SHORT TERM GOAL #2   Title  Pt independent with updated Lt hand  Time  --    Period  Weeks    Status  Achieved      OT SHORT TERM GOAL #3   Title  Pt to verbalize understanding with safety considerations LUE d/t lack of sensation    Time  --    Period  Weeks    Status  Achieved      OT SHORT TERM GOAL #4   Title  Pt to verbalize understanding of A/E for shoelaces and cooking    Time  --    Period  Weeks    Status  Deferred   Pt shown A/E but not interested in pursuing     OT SHORT TERM GOAL #5   Title  Pt to consistently perform UE dressing (including jacket) and bathing Rt arm fully at mod I level    Baseline  min assist    Time  --    Period  Weeks    Status  Achieved      OT SHORT TERM GOAL #6   Title  Pt to achieve 90* shoulder flexion LUE w/ min compensations and pain 3/10 or under    Time  --    Period  Weeks    Status  Not Met   mod compensations with 90*       OT Long Term Goals - 06/15/19 1105      OT LONG TERM GOAL #1   Title  Pt to improve function LUE as evidenced by performing 18 on Box & Blocks test LUE -    Baseline  12    Period  Weeks     Status  Achieved   22 blocks     OT LONG TERM GOAL #2   Title  Pt to improve LUE coordination as evidenced by placing 9 pegs in pegboard in 2 min. or under    Baseline  only placed 1 peg in 2 min    Time  8    Period  Weeks    Status  Not Met   1 peg placed in 2 mins     OT LONG TERM GOAL #3   Title  Pt to consistently demo mid level reaching w/ min compensations to reduce pain LUE-07/22/19    Time  5    Period  Weeks    Status  On-going   inconsitent min-mod compensations     OT LONG TERM GOAL #4   Title  Pt able to tie shoelaces with extra time prn    Time  8    Period  Weeks    Status  On-going      OT LONG TERM GOAL #5   Title  Pt will demonstrate improved LUE functional use as evidenced by performing 25 blocks on box/ blocks    Baseline  22 blocks 06/15/19    Time  5    Period  Weeks    Status  New    Target Date  07/22/19      OT LONG TERM GOAL #6   Title  Pt will demonstrate improved fine motor coordination for ADLS as evidenced by placing 3 pegs in 9-hole pegboard in 2 mins    Time  5    Period  Weeks    Status  New   baseline-1 peg placed in 2 mins     OT LONG TERM GOAL #7   Title  Pt will retrieve a lightweight object at 100* shoulder flexion with no more than min compensations.  Time  5    Period  Weeks    Status  New            Plan - 06/15/19 1111    Clinical Impression Statement  Pt is progressing towards goals however he remains limited by pain and sensory impairments. Pt can benefit from skilled O.T. to address LUE weakness in oder to maximize pt's safety and indpendence with ADLs/IADL.    Occupational Profile and client history currently impacting functional performance  PMH; HTN, HLD, DM, A-fib. Current deficits impeding pt's ability to perform ADLS, IADLS, and role as husband, friend, and leisure participation    Occupational performance deficits (Please refer to evaluation for details):  ADL's;IADL's;Leisure    Body Structure / Function /  Physical Skills  ADL;Decreased knowledge of precautions;ROM;UE functional use;FMC;Decreased knowledge of use of DME;Balance;Sensation;Endurance;Pain;Strength;Coordination;IADL;Proprioception;Tone;Mobility    Cognitive Skills  Attention;Safety Awareness;Memory    Rehab Potential  Good    OT Frequency  2x / week    OT Duration  8 weeks    OT Treatment/Interventions  Self-care/ADL training;Moist Heat;DME and/or AE instruction;Splinting;Therapeutic activities;Psychosocial skills training;Aquatic Therapy;Therapeutic exercise;Cognitive remediation/compensation;Coping strategies training;Neuromuscular education;Functional Mobility Training;Passive range of motion;Visual/perceptual remediation/compensation;Manual Therapy;Patient/family education;Electrical Stimulation    Plan  discuss continuing vs. 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 Chronic left shoulder pain 05/12/2019  . Subacromial bursitis of left shoulder joint 09/10/2018  . Spastic hemiplegia affecting nondominant side ([place]) 07/07/2018  . Abnormality of gait 06/09/2018  . Neuropathic pain   . Labile blood pressure   . PAF  (paroxysmal atrial fibrillation) ([place])   . Hemiparesis affecting left side as late effect of stroke ([place])   . Thalamic hemorrhage ([place]) 03/17/2018  . Benign essential HTN   . Dyslipidemia   . Hemorrhagic stroke ([place])   . ICH (intracerebral hemorrhage) ([place]) 03/12/2018  . Prostate cancer ([place]) 01/29/2017  . 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 Diabetes ([place]) 12/14/2015  . PCP NOTES >>>>>>>>>>>>>>>>>>>>>>>>>>>>>>. 08/06/2015  . Dizziness and giddiness 01/29/2015  . Umbilical hernia 30/16/0109  . Elevated PSA, less than 10 ng/ml 04/19/2012  . Annual physical exam 01/24/2011  . Hyperlipidemia 01/03/2010  . Essential hypertension 09/20/2007    RINE,KATHRYN 06/15/2019, 11:14 AM  [facility] [address] [place], [place], [address] Phone: [phone]   Fax:  [phone]  Name: Marcus Beasley MRN: [id number] Date of Birth: 1946/01/02

## 2019-06-24 ENCOUNTER — Other Ambulatory Visit: Payer: Self-pay | Admitting: Internal Medicine

## 2019-06-27 ENCOUNTER — Ambulatory Visit: Payer: Medicare Other | Admitting: Occupational Therapy

## 2019-06-27 ENCOUNTER — Ambulatory Visit: Payer: Medicare Other | Attending: Physical Medicine & Rehabilitation | Admitting: Occupational Therapy

## 2019-06-27 ENCOUNTER — Other Ambulatory Visit: Payer: Self-pay

## 2019-06-27 DIAGNOSIS — R278 Other lack of coordination: Secondary | ICD-10-CM | POA: Diagnosis not present

## 2019-06-27 DIAGNOSIS — R209 Unspecified disturbances of skin sensation: Secondary | ICD-10-CM | POA: Insufficient documentation

## 2019-06-27 DIAGNOSIS — I69118 Other symptoms and signs involving cognitive functions following nontraumatic intracerebral hemorrhage: Secondary | ICD-10-CM | POA: Insufficient documentation

## 2019-06-27 DIAGNOSIS — R2689 Other abnormalities of gait and mobility: Secondary | ICD-10-CM | POA: Diagnosis not present

## 2019-06-27 DIAGNOSIS — I69254 Hemiplegia and hemiparesis following other nontraumatic intracranial hemorrhage affecting left non-dominant side: Secondary | ICD-10-CM | POA: Insufficient documentation

## 2019-06-27 DIAGNOSIS — R2681 Unsteadiness on feet: Secondary | ICD-10-CM | POA: Diagnosis not present

## 2019-06-27 DIAGNOSIS — M6281 Muscle weakness (generalized): Secondary | ICD-10-CM

## 2019-06-27 NOTE — Therapy (Signed)
[facility] [address] [place], [place], [address] Phone: [phone]   Fax:  [phone]  Occupational Therapy Treatment  Patient Details  Name: Marcus Beasley MRN: [id number] Date of Birth: 31-Mar-1946 Referring Provider (OT): Delice Lesch   Encounter Date: 06/27/2019  OT End of Session - 06/27/19 0928    Visit Number  12    Number of Visits  17    Date for OT Re-Evaluation  07/22/19    Authorization Type  [employer] MCR    Authorization - Visit Number  12    Authorization - Number of Visits  20    OT Start Time  0845    OT Stop Time  0930    OT Time Calculation (min)  45 min    Activity Tolerance  Patient tolerated treatment well    Behavior During Therapy  [facility] for tasks assessed/performed       Past Medical History:  Diagnosis Date  . BPH (benign prostatic hyperplasia)    (-) Bx 2015  . Diabetes mellitus without complication ([place])   . Elevated PSA    Prostate Bx in 06/2013 was benign  . HTN (hypertension)   . Hyperlipidemia   . Macular degeneration, age related     Past Surgical History:  Procedure Laterality Date  . ABCESS DRAINAGE     abdomen- 26 day hospitalization 1968  . COLONOSCOPY  2016  . HERNIA REPAIR  summer '11   umbilical, dr Ninfa Linden   . POLYPECTOMY    . PROSTATE BIOPSY  06-2013 , 07-2017   (-), (-)    There were no vitals filed for this visit.  Subjective Assessment - 06/27/19 0852    Pertinent History  Rt thalamic hemorrhage 03/12/18. PMH: HTN, HLD, DM, A-fib    Limitations  Fall risk    Patient Stated Goals  get more function in my Lt arm and hand    Currently in Pain?  No/denies       Supine: holding 2 lb weight in both hands with palms facing each other beginning w/ elbows bent, extending elbows straight towards ceiling then overhead, back to ceiling then bend elbows back to chest w/ min cues for proper positioning x 10 reps. Progressed to holding 1 lb weight LUE only w/ occasional  min facilitation provided to work on control and strengthening LUE w/ trunk supported.  Seated: AA/ROM in closed chain and supported motion mid to high levels. Provided visual target (holding cup) to prevent sh IR and abduction. Then focused on isolated forearm rotation w/o shoulder IR w/ and w/o distal support.  Flipping cards over w/ emphasis on finger extension in prep for graded grasping and forearm supination in isolation w/ upper arm down UBE x 8 min level 3 for reciprocal movement pattern                      OT Short Term Goals - 06/15/19 1057      OT SHORT TERM GOAL #1   Title  Pt independent with updated LUE HEP for neuro re-education - 05/12/19    Time  --    Period  Weeks    Status  Achieved      OT SHORT TERM GOAL #2   Title  Pt independent with updated Lt hand    Time  --    Period  Weeks    Status  Achieved      OT SHORT TERM GOAL #3  Title  Pt to verbalize understanding with safety considerations LUE d/t lack of sensation    Time  --    Period  Weeks    Status  Achieved      OT SHORT TERM GOAL #4   Title  Pt to verbalize understanding of A/E for shoelaces and cooking    Time  --    Period  Weeks    Status  Deferred   Pt shown A/E but not interested in pursuing     OT SHORT TERM GOAL #5   Title  Pt to consistently perform UE dressing (including jacket) and bathing Rt arm fully at mod I level    Baseline  min assist    Time  --    Period  Weeks    Status  Achieved      OT SHORT TERM GOAL #6   Title  Pt to achieve 90* shoulder flexion LUE w/ min compensations and pain 3/10 or under    Time  --    Period  Weeks    Status  Not Met   mod compensations with 90*       OT Long Term Goals - 06/15/19 1105      OT LONG TERM GOAL #1   Title  Pt to improve function LUE as evidenced by performing 18 on Box & Blocks test LUE -    Baseline  12    Period  Weeks    Status  Achieved   22 blocks     OT LONG TERM GOAL #2   Title  Pt to improve  LUE coordination as evidenced by placing 9 pegs in pegboard in 2 min. or under    Baseline  only placed 1 peg in 2 min    Time  8    Period  Weeks    Status  Not Met   1 peg placed in 2 mins     OT LONG TERM GOAL #3   Title  Pt to consistently demo mid level reaching w/ min compensations to reduce pain LUE-07/22/19    Time  5    Period  Weeks    Status  On-going   inconsitent min-mod compensations     OT LONG TERM GOAL #4   Title  Pt able to tie shoelaces with extra time prn    Time  8    Period  Weeks    Status  On-going      OT LONG TERM GOAL #5   Title  Pt will demonstrate improved LUE functional use as evidenced by performing 25 blocks on box/ blocks    Baseline  22 blocks 06/15/19    Time  5    Period  Weeks    Status  New    Target Date  07/22/19      OT LONG TERM GOAL #6   Title  Pt will demonstrate improved fine motor coordination for ADLS as evidenced by placing 3 pegs in 9-hole pegboard in 2 mins    Time  5    Period  Weeks    Status  New   baseline-1 peg placed in 2 mins     OT LONG TERM GOAL #7   Title  Pt will retrieve a lightweight object at 100* shoulder flexion with no more than min compensations.    Time  5    Period  Weeks    Status  New  Plan - 06/27/19 0929    Clinical Impression Statement  Pt progressing with carryover of proper techniques for normal reaching patterns and is able to better prevent compensations. Pt does better w/ repetition and reiteration.    Occupational Profile and client history currently impacting functional performance  PMH; HTN, HLD, DM, A-fib. Current deficits impeding pt's ability to perform ADLS, IADLS, and role as husband, friend, and leisure participation    Occupational performance deficits (Please refer to evaluation for details):  ADL's;IADL's;Leisure    Body Structure / Function / Physical Skills  ADL;Decreased knowledge of precautions;ROM;UE functional use;FMC;Decreased knowledge of use of  DME;Balance;Sensation;Endurance;Pain;Strength;Coordination;IADL;Proprioception;Tone;Mobility    Cognitive Skills  Attention;Safety Awareness;Memory    Rehab Potential  Good    OT Frequency  2x / week    OT Duration  --   5 weeks   OT Treatment/Interventions  Self-care/ADL training;Moist Heat;DME and/or AE instruction;Splinting;Therapeutic activities;Psychosocial skills training;Aquatic Therapy;Therapeutic exercise;Cognitive remediation/compensation;Coping strategies training;Neuromuscular education;Functional Mobility Training;Passive range of motion;Visual/perceptual remediation/compensation;Manual Therapy;Patient/family education;Electrical Stimulation    Plan  Plan to continue for 2-3 weeks and then d/c. Continue to focus on higher closed chain and lower open chain functional reaching with normal movement patterns    Consulted and Agree with Plan of Care  Patient       Patient will benefit from skilled therapeutic intervention in order to improve the following deficits and impairments:   Body Structure / Function / Physical Skills: ADL, Decreased knowledge of precautions, ROM, UE functional use, FMC, Decreased knowledge of use of DME, Balance, Sensation, Endurance, Pain, Strength, Coordination, IADL, Proprioception, Tone, Mobility Cognitive Skills: Attention, Safety Awareness, Memory     Visit Diagnosis: 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 Chronic left shoulder pain 05/12/2019  . Subacromial bursitis of left shoulder joint 09/10/2018  . Spastic hemiplegia affecting nondominant side ([place]) 07/07/2018  . Abnormality of gait 06/09/2018  . Neuropathic pain   . Labile blood pressure   . PAF (paroxysmal atrial fibrillation) ([place])   . Hemiparesis affecting left side as late  effect of stroke ([place])   . Thalamic hemorrhage ([place]) 03/17/2018  . Benign essential HTN   . Dyslipidemia   . Hemorrhagic stroke ([place])   . ICH (intracerebral hemorrhage) ([place]) 03/12/2018  . Prostate cancer ([place]) 01/29/2017  . 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 Diabetes ([place]) 12/14/2015  . PCP NOTES >>>>>>>>>>>>>>>>>>>>>>>>>>>>>>. 08/06/2015  . Dizziness and giddiness 01/29/2015  . Umbilical hernia 13/14/3888  . Elevated PSA, less than 10 ng/ml 04/19/2012  . Annual physical exam 01/24/2011  . Hyperlipidemia 01/03/2010  . Essential hypertension 09/20/2007    Carey Bullocks, OTR/L 06/27/2019, 11:20 AM  [facility] [address] [place], [place], [address] Phone: [phone]   Fax:  [phone]  Name: Marcus Beasley MRN: [id number] Date of Birth: Jun 25, 1945

## 2019-06-28 LAB — PSA: PSA: 9.24

## 2019-06-29 ENCOUNTER — Encounter: Payer: Self-pay | Admitting: Physical Therapy

## 2019-06-29 ENCOUNTER — Other Ambulatory Visit: Payer: Self-pay

## 2019-06-29 ENCOUNTER — Ambulatory Visit: Payer: Medicare Other | Admitting: Physical Therapy

## 2019-06-29 DIAGNOSIS — I69254 Hemiplegia and hemiparesis following other nontraumatic intracranial hemorrhage affecting left non-dominant side: Secondary | ICD-10-CM | POA: Diagnosis not present

## 2019-06-29 DIAGNOSIS — I69118 Other symptoms and signs involving cognitive functions following nontraumatic intracerebral hemorrhage: Secondary | ICD-10-CM | POA: Diagnosis not present

## 2019-06-29 DIAGNOSIS — R2681 Unsteadiness on feet: Secondary | ICD-10-CM

## 2019-06-29 DIAGNOSIS — R209 Unspecified disturbances of skin sensation: Secondary | ICD-10-CM | POA: Diagnosis not present

## 2019-06-29 DIAGNOSIS — R2689 Other abnormalities of gait and mobility: Secondary | ICD-10-CM

## 2019-06-29 DIAGNOSIS — R278 Other lack of coordination: Secondary | ICD-10-CM | POA: Diagnosis not present

## 2019-06-29 DIAGNOSIS — M6281 Muscle weakness (generalized): Secondary | ICD-10-CM

## 2019-06-29 NOTE — Therapy (Signed)
[facility] [address] [place], [place], [address] Phone: [phone]   Fax:  [phone]  Physical Therapy Treatment  Patient Details  Name: Marcus Beasley MRN: [id number] Date of Birth: 05/28/1946 Referring Provider (PT): Patel   Encounter Date: 06/29/2019  PT End of Session - 06/29/19 0807    Visit Number  9    Number of Visits  17    Date for PT Re-Evaluation  07/10/19    Authorization Type  [employer] Medicare; will need 10th visit progress note    PT Start Time  0803    PT Stop Time  0846    PT Time Calculation (min)  43 min    Equipment Utilized During Treatment  Gait belt    Activity Tolerance  Patient tolerated treatment well    Behavior During Therapy  WFL for tasks assessed/performed       Past Medical History:  Diagnosis Date  . BPH (benign prostatic hyperplasia)    (-) Bx 2015  . Diabetes mellitus without complication (HCC)   . Elevated PSA    Prostate Bx in 06/2013 was benign  . HTN (hypertension)   . Hyperlipidemia   . Macular degeneration, age related     Past Surgical History:  Procedure Laterality Date  . ABCESS DRAINAGE     abdomen- 26 day hospitalization 1968  . COLONOSCOPY  2016  . HERNIA REPAIR  summer '11   umbilical, dr Blackman   . POLYPECTOMY    . PROSTATE BIOPSY  06-2013 , 07-2017   (-), (-)    There were no vitals filed for this visit.  Subjective Assessment - 06/29/19 0806    Subjective  No new complaints.No falls or pain at this time (does have pain when he reaches his left arm up).    Patient Stated Goals  I'd like to be able towalk a little better.    Currently in Pain?  No/denies    Pain Score  0-No pain           [facility] Adult PT Treatment/Exercise - 06/29/19 0808      Transfers   Transfers  Sit to Stand;Stand to Sit    Sit to Stand  6: Modified independent (Device/Increase time);Without upper extremity assist;From chair/3-in-1;From bed    Stand to Sit  6:  Modified independent (Device/Increase time);Without upper extremity assist;To chair/3-in-1;To bed      Ambulation/Gait   Ambulation/Gait  Yes    Ambulation/Gait Assistance  5: Supervision    Ambulation/Gait Assistance Details  worked on scanning enviroment, speed changes, sudden stops with gait with no balance issues noted with no AD.     Ambulation Distance (Feet)  200 Feet    Assistive device  Straight cane;None   cane with rubber quad tip   Gait Pattern  Step-through pattern;Decreased arm swing - left;Decreased step length - left;Decreased stance time - left;Decreased dorsiflexion - left;Decreased weight shift to left;Trunk flexed    Ambulation Surface  Level;Indoor      Exercises   Exercises  Other Exercises    Other Exercises   for left LE strengthening: seated at corner of mat in stride position with left foot back for sit<>stands for 15 reps; half kneeling on red mat on floor- half kneeling to standing and back down x 10 reps with left foot back, then left foot forward; in parallel bars with bil UE support- left single heel raise with 5 sec holds for 10 reps, then bil LE's squats   with rise up onto toes on return for 10 reps, alternating fwd lunges with no UE support for 10 reps on each side. cues needed with all ex's for form and technique.           Balance Exercises - 06/29/19 0845      Balance Exercises: Standing   Rockerboard  Anterior/posterior;EO;EC;30 seconds;10 reps;Limitations    Rockerboard Limitations  on balance board in ant/post direction only- rocking the board with EO progressing to EC, then holding the board steady for EC no head movements. min to mod assist with vision removed for balance with no UE support          PT Short Term Goals - 05/26/19 1700      PT SHORT TERM GOAL #1   Title  Pt will be independent with HEP for improved strength, balance, gait.  TARGET 4 weeks, 05/13/19 (may be modified due to delayed scheduling from eval)    Baseline  05/26/19:  met with current program, updated HEP today    Status  Achieved      PT SHORT TERM GOAL #2   Title  Pt will improve DGI score to at least 15/24 for decreased fall risk.    Baseline  DGI 12/24 04/11/2019 eval; 14/24 05/24/2019    Status  Partially Met      PT SHORT TERM GOAL #3   Title  Pt will improve 5x sit<>stand to less than or equal to 13 seconds for improved transfer efficiency and functional strength.    Baseline  14.47 sec at best    Status  Not Met      PT SHORT TERM GOAL #4   Title  Pt will improve TUG score to less than or equal to 15 seconds for decreased fall risk.    Baseline  15.75 sec 05/24/2019    Status  Partially Met        PT Long Term Goals - 06/30/19 1013      PT LONG TERM GOAL #1   Title  Pt will verbalize plans for progress of HPE, including continued community fitness upon d/c from PT.  TARGET 8 weeks, continue to work toward goals through [place] with end date of 07/10/19 per PT    Baseline  will need to be updated at discharge    Status  On-going      PT LONG TERM GOAL #2   Title  Pt will improve DGI to at least 19/24 for decreased fall risk.    Baseline  18/24 on 06/15/19    Time  8    Period  Weeks    Status  On-going      PT LONG TERM GOAL #3   Title  Pt will improve gait velocity score to at least 2.3 ft/sec for improved gait efficiency and safety.    Baseline  2.75 ft/secon with quad tip cane on 06/15/19    Status  Achieved      PT LONG TERM GOAL #4   Title  Pt will improve TUG score to less than or equal to 13.5 seconds for decreased fall risk.    Baseline  11.75 sec with quad tip straight cane on 06/15/19    Status  Achieved      PT LONG TERM GOAL #5   Title  Pt will ambulate at least 1000 ft using cane/no device, modified independently, for improved independence with gait on indoor and outdoor surfaces    Time  8  Period  Weeks    Status  On-going      PT LONG TERM GOAL #6   Title  Pt will negotiate at least 12 steps, one handrail,  step-through pattern, modified independently, for improved stair negotiation in home.    Status  Achieved            Plan - 06/29/19 0807    Clinical Impression Statement  Today's skilled session continued to focus on gait with no device and left LE strengthening with emphasis on push off/PF strength. No issues reported in session. The pt is progressing well toward goals and should benefit from continued PT to progress toward unmet goals.    Personal Factors and Comorbidities  Comorbidity 3+    Comorbidities  See PMH    Examination-Activity Limitations  Stairs;Locomotion Level;Transfers    Examination-Participation Restrictions  Community Activity;Yard Work;Meal Prep    Stability/Clinical Decision Making  Evolving/Moderate complexity    Rehab Potential  Good    Clinical Impairments Affecting Rehab Potential  good family support; independnet prior to CVA    PT Frequency  2x / week    PT Duration  8 weeks   plus eval   PT Treatment/Interventions  ADLs/Self Care Home Management;Electrical Stimulation;Therapeutic exercise;Therapeutic activities;Functional mobility training;Gait training;Stair training;DME Instruction;Balance training;Neuromuscular re-education;Patient/family education;Orthotic Fit/Training;Manual techniques;Aquatic Therapy    PT Next Visit Plan 10th visit progress note due next session; Work on exercises to strengthen L plantarflexors-forward lunges, stride stance sit<>stand from corner of mat, single limb heel raises, wave squats (squats>up on toes) and add to HEP as needed; gait with SPC versus no device,continuing to work on Aeronautical engineer for improved smoothness of gait    PT Home Exercise Plan  Access Code: [email or web address]    Consulted and Agree with Plan of Care  Patient       Patient will benefit from skilled therapeutic intervention in order to improve the following deficits and impairments:  Abnormal gait, Decreased activity tolerance, Decreased balance, Decreased  mobility, Difficulty walking, Decreased strength, Impaired flexibility, Impaired tone  Visit Diagnosis: 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 Chronic left shoulder pain 05/12/2019  . Subacromial bursitis of left shoulder joint 09/10/2018  . Spastic hemiplegia affecting nondominant side ([place]) 07/07/2018  . Abnormality of gait 06/09/2018  . Neuropathic pain   . Labile blood pressure   . PAF (paroxysmal atrial fibrillation) ([place])   . Hemiparesis affecting left side as late effect of stroke ([place])   . 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 Benign essential HTN   . Dyslipidemia   . Hemorrhagic stroke ([place])   . ICH (intracerebral hemorrhage) ([place]) 03/12/2018  . Prostate cancer ([place]) 01/29/2017  . 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 Diabetes ([place]) 12/14/2015  . PCP NOTES >>>>>>>>>>>>>>>>>>>>>>>>>>>>>>. 08/06/2015  . Dizziness and giddiness 01/29/2015  . Umbilical hernia 09/62/8366  . Elevated PSA, less than 10 ng/ml 04/19/2012  . Annual physical exam 01/24/2011  . Hyperlipidemia 01/03/2010  . Essential hypertension 09/20/2007    Willow Ora, PTA, [facility] [address], [place], [place] [address] [phone] 06/30/19, 10:14 AM   Name: MARKEVIOUS EHMKE MRN: [id number] Date of Birth: January 07, 1946

## 2019-06-30 ENCOUNTER — Encounter: Payer: Self-pay | Admitting: Physical Medicine & Rehabilitation

## 2019-06-30 ENCOUNTER — Other Ambulatory Visit: Payer: Self-pay

## 2019-06-30 ENCOUNTER — Encounter: Payer: Medicare Other | Attending: Physical Medicine & Rehabilitation | Admitting: Physical Medicine & Rehabilitation

## 2019-06-30 VITALS — BP 116/81 | Temp 97.7°F | Ht 71.0 in | Wt 169.0 lb

## 2019-06-30 DIAGNOSIS — M792 Neuralgia and neuritis, unspecified: Secondary | ICD-10-CM

## 2019-06-30 DIAGNOSIS — G8929 Other chronic pain: Secondary | ICD-10-CM

## 2019-06-30 DIAGNOSIS — M25512 Pain in left shoulder: Secondary | ICD-10-CM

## 2019-06-30 DIAGNOSIS — I10 Essential (primary) hypertension: Secondary | ICD-10-CM | POA: Insufficient documentation

## 2019-06-30 DIAGNOSIS — M7552 Bursitis of left shoulder: Secondary | ICD-10-CM | POA: Diagnosis not present

## 2019-06-30 DIAGNOSIS — E119 Type 2 diabetes mellitus without complications: Secondary | ICD-10-CM | POA: Diagnosis not present

## 2019-06-30 DIAGNOSIS — G8110 Spastic hemiplegia affecting unspecified side: Secondary | ICD-10-CM

## 2019-06-30 DIAGNOSIS — R269 Unspecified abnormalities of gait and mobility: Secondary | ICD-10-CM | POA: Diagnosis not present

## 2019-06-30 DIAGNOSIS — I69354 Hemiplegia and hemiparesis following cerebral infarction affecting left non-dominant side: Secondary | ICD-10-CM

## 2019-06-30 MED ORDER — PREGABALIN 100 MG PO CAPS: 100.0000 mg | ORAL_CAPSULE | Freq: Three times a day (TID) | ORAL | 1 refills | Status: DC

## 2019-06-30 NOTE — Progress Notes (Signed)
Subjective:    Patient ID: Marcus Beasley, male    DOB: March 30, 1946, 74 y.o.   MRN: [id number]  HPI:  Right-handed male with history of diabetes mellitus, hypertension presents for follow up for right thalamic hemorrhage.   Last clinic visit 06/07/2019.  Somewhat limited historian. He had shoulder injection at that time.  Since that time, he is in therapies.  OT notes reviewed, improvement. He later notices improvement in ROM and improvement in pain, but tightness along his neck and shoulder.  He is not not sure if he is taking Baclofen. Does notice much benefit with Lyrica. Denies falls.   Pain Inventory Average Pain 3 Pain Right Now 1 My pain is tingling and aching  In the last 24 hours, has pain interfered with the following? General activity 3 Relation with others 0 Enjoyment of life 0 What TIME of day is your pain at its worst? daytime Sleep (in general) Fair  Pain is worse with: some activites Pain improves with: nothing Relief from Meds: 0  Mobility walk with assistance use a cane  Function retired  Neuro/Psych numbness tingling trouble walking  Prior Studies Any changes since last visit?  no  Physicians involved in your care Any changes since last visit?  no   Family History  Problem Relation Age of Onset  . Leukemia Mother   . Heart attack Father        MI age 8  . Heart disease Sister        age 62  . Cancer - Other Sister        type  . Heart disease Brother        age 76  . Kidney disease Brother        HD  . Diabetes Maternal Grandmother   . Prostate cancer Neg Hx   . Colon cancer Neg Hx   . Esophageal cancer Neg Hx   . Rectal cancer Neg Hx   . Stomach cancer Neg Hx    Social History   Socioeconomic History  . Marital status: Married    Spouse name: Not on file  . Number of children: 3  . Years of education: 86  . Highest education level: Not on file  Occupational History  . 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 Smoking status: Never Smoker  . Smokeless tobacco: Never Used  Substance and Sexual Activity  . Alcohol use: Yes    Comment: occ. beer  . Drug use: No  . Sexual activity: Yes    Partners: Female  Other Topics Concern  . 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 2 dtrs , 1 son - homicide. 5 grandchildren.     Household: pt, wife, daughter and son   Does not drive    Social Determinants of Radio broadcast assistant Strain:   . Difficulty of Paying Living Expenses: Not on file  Food Insecurity:   . Worried About Charity fundraiser in the Last Year: Not on file  . Ran Out of Food in the Last Year: Not on file  Transportation Needs:   . Lack of Transportation (Medical): Not on file  . Lack of Transportation (Non-Medical): Not on file  Physical Activity:   . Days of Exercise per Week: Not on file  . Minutes of Exercise per Session: Not on file  Stress:   . Feeling  of Stress : Not on file  Social Connections:   . Frequency of Communication with Friends and Family: Not on file  . Frequency of Social Gatherings with Friends and Family: Not on file  . Attends Religious Services: Not on file  . Active Member of Clubs or Organizations: Not on file  . Attends Archivist Meetings: Not on file  . Marital Status: Not on file   Past Surgical History:  Procedure Laterality Date  . ABCESS DRAINAGE     abdomen- 26 day hospitalization 1968  . COLONOSCOPY  2016  . HERNIA REPAIR  summer '11   umbilical, dr Ninfa Linden   . POLYPECTOMY    . PROSTATE BIOPSY  06-2013 , 07-2017   (-), (-)   Past Medical History:  Diagnosis Date  . BPH (benign prostatic hyperplasia)    (-) Bx 2015  . Diabetes mellitus without complication ([place])   . Elevated PSA    Prostate Bx in 06/2013 was benign  . HTN (hypertension)   . Hyperlipidemia   . Macular degeneration, age related    BP 116/81   Temp 97.7 F (36.5 C)    Ht 5\' 11"  (1.803 m)   Wt 169 lb (76.7 kg)   BMI 23.57 kg/m   Opioid Risk Score:   Fall Risk Score:  `1  Depression screen PHQ 2/9  Depression screen [facility] 2/9 11/11/2018 10/06/2018 04/28/2018 08/25/2017 04/14/2016 12/14/2015 08/06/2015  Decreased Interest 0 0 0 0 0 0 0  Down, Depressed, Hopeless 0 0 0 0 0 0 0  PHQ - 2 Score 0 0 0 0 0 0 0  Some recent data might be hidden     Review of Systems  Constitutional: Negative.        Appetite poor and feels like he is still losing weight. (has not weighed since last visit)  HENT: Negative.   Eyes: Negative.   Respiratory: Negative.   Cardiovascular: Negative.   Gastrointestinal: Negative.   Endocrine: Negative.   Genitourinary: Negative.        Hesitancy  Musculoskeletal: Positive for arthralgias and gait problem.  Skin: Negative.   Allergic/Immunologic: Negative.   Neurological:       Tingling  Hematological: Negative.        On eliquis  Psychiatric/Behavioral: Negative.       Objective:   Physical Exam  Constitutional: No distress . Vital signs reviewed. HENT: Normocephalic.  Atraumatic. Eyes: EOMI. No discharge. Cardiovascular: No JVD. Respiratory: Normal effort.  No stridor. GI: Non-distended. Skin: Warm and dry.  Intact. Psych: Normal mood.  Normal behavior. Musc: No pain with ER/IR Left shoulder Pain with shoulder abduction, improved Neuro: Alert Motor:  LUE: Shoulder abduction 4/5, elbow flex 4+/5, elbow extension 4/5, hand grip 4-/5 with apraxia, improving LLE: HF, KE, ADF 4+/5,improving Mas: left elbow flexors: 1/4, limited due to patient resistance, but appears to have some improvement     Assessment & Plan:  Right-handed male with history of diabetes mellitus, hypertension presents for follow up for right thalamic hemorrhage, now with some spasticity.   1. Left-sided weakness secondary to right thalamic hemorrhage secondary to hypertensive crisis now with spasticity  Resume therapies when possible, continues to  await  Cont follow up with Neurology  Will increase Botox:   Left Biceps: 100 units   Left upper traps: 100 units   Left Med Pec: 100 units   2. Pain Management:   ?No benefit with Baclofen, ?taking at presents, pt states daughter manages  Tylenol  as needed  Will consider Elavil 10 qhs, pt does not want at present  D/ced Tramadol  Cont ROM  Will increase Lyrica 100 TID, unable to tolerate higher doses of Gabapentin  Will consider PNS  3. Gait abnormality  Resume therapies when possible, continues to await  Cont cane for safety  4. Post stroke shoulder pain  Subacromial steroid injection under ulatrasound with benefit in pain and ROM

## 2019-07-01 ENCOUNTER — Ambulatory Visit: Payer: Medicare Other | Admitting: Physical Medicine & Rehabilitation

## 2019-07-04 ENCOUNTER — Ambulatory Visit: Payer: Medicare Other | Admitting: Physical Medicine & Rehabilitation

## 2019-07-04 DIAGNOSIS — N411 Chronic prostatitis: Secondary | ICD-10-CM | POA: Diagnosis not present

## 2019-07-05 ENCOUNTER — Other Ambulatory Visit: Payer: Self-pay

## 2019-07-05 ENCOUNTER — Ambulatory Visit: Payer: Medicare Other | Admitting: Occupational Therapy

## 2019-07-05 ENCOUNTER — Encounter: Payer: Self-pay | Admitting: Occupational Therapy

## 2019-07-05 DIAGNOSIS — M6281 Muscle weakness (generalized): Secondary | ICD-10-CM

## 2019-07-05 DIAGNOSIS — I69254 Hemiplegia and hemiparesis following other nontraumatic intracranial hemorrhage affecting left non-dominant side: Secondary | ICD-10-CM

## 2019-07-05 DIAGNOSIS — R2681 Unsteadiness on feet: Secondary | ICD-10-CM | POA: Diagnosis not present

## 2019-07-05 DIAGNOSIS — I69118 Other symptoms and signs involving cognitive functions following nontraumatic intracerebral hemorrhage: Secondary | ICD-10-CM

## 2019-07-05 DIAGNOSIS — R278 Other lack of coordination: Secondary | ICD-10-CM | POA: Diagnosis not present

## 2019-07-05 DIAGNOSIS — R209 Unspecified disturbances of skin sensation: Secondary | ICD-10-CM | POA: Diagnosis not present

## 2019-07-05 DIAGNOSIS — R2689 Other abnormalities of gait and mobility: Secondary | ICD-10-CM

## 2019-07-05 DIAGNOSIS — R208 Other disturbances of skin sensation: Secondary | ICD-10-CM

## 2019-07-05 NOTE — Therapy (Signed)
[facility] [address] [place], [place], [address] Phone: [phone]   Fax:  [phone]  Occupational Therapy Treatment  Patient Details  Name: Marcus Beasley MRN: [id number] Date of Birth: 1945-07-18 Referring Provider (OT): Delice Lesch   Encounter Date: 07/05/2019  OT End of Session - 07/05/19 0819    Visit Number  13    Number of Visits  17    Date for OT Re-Evaluation  07/22/19    Authorization Type  [employer] MCR    Authorization - Visit Number  13    Authorization - Number of Visits  20    OT Start Time  [phone]    OT Stop Time  0800    OT Time Calculation (min)  42 min    Activity Tolerance  Patient tolerated treatment well    Behavior During Therapy  [facility] for tasks assessed/performed       Past Medical History:  Diagnosis Date  . BPH (benign prostatic hyperplasia)    (-) Bx 2015  . Diabetes mellitus without complication ([place])   . Elevated PSA    Prostate Bx in 06/2013 was benign  . HTN (hypertension)   . Hyperlipidemia   . Macular degeneration, age related     Past Surgical History:  Procedure Laterality Date  . ABCESS DRAINAGE     abdomen- 26 day hospitalization 1968  . COLONOSCOPY  2016  . HERNIA REPAIR  summer '11   umbilical, dr Ninfa Linden   . POLYPECTOMY    . PROSTATE BIOPSY  06-2013 , 07-2017   (-), (-)    There were no vitals filed for this visit.  Subjective Assessment - 07/05/19 0819    Subjective   No denies pain    Pertinent History  Rt thalamic hemorrhage 03/12/18. PMH: HTN, HLD, DM, A-fib    Limitations  Fall risk    Patient Stated Goals  get more function in my Lt arm and hand    Currently in Pain?  No/denies    Pain Onset  More than a month ago               Treatment: Closed chain shoulder flexion and chest press with min facilitation/ v.c for normal movement patterns. Seated mid range closed chain chest press and shoulder flexion with mod facilitation/ v.c Standing at  table weight bearing through bilateral EU's rocking forwards and backwards, min-mod facilitation for weightbearing through left side. Standing to perform low range open chain reaching to grasp release graded clothespins, mod difficulty/ v.c Seated stacking 2 blocks then unstacking to place in container with LUE for increased control, min v.c Functional grasp/ release of a styrofoam cup to simulate drinking, for graded grasp, min v.c Arm bike x 5 mins level 4 for reciprocal movement.              OT Short Term Goals - 06/15/19 1057      OT SHORT TERM GOAL #1   Title  Pt independent with updated LUE HEP for neuro re-education - 05/12/19    Time  --    Period  Weeks    Status  Achieved      OT SHORT TERM GOAL #2   Title  Pt independent with updated Lt hand    Time  --    Period  Weeks    Status  Achieved      OT SHORT TERM GOAL #3   Title  Pt to verbalize understanding with safety  considerations LUE d/t lack of sensation    Time  --    Period  Weeks    Status  Achieved      OT SHORT TERM GOAL #4   Title  Pt to verbalize understanding of A/E for shoelaces and cooking    Time  --    Period  Weeks    Status  Deferred   Pt shown A/E but not interested in pursuing     OT SHORT TERM GOAL #5   Title  Pt to consistently perform UE dressing (including jacket) and bathing Rt arm fully at mod I level    Baseline  min assist    Time  --    Period  Weeks    Status  Achieved      OT SHORT TERM GOAL #6   Title  Pt to achieve 90* shoulder flexion LUE w/ min compensations and pain 3/10 or under    Time  --    Period  Weeks    Status  Not Met   mod compensations with 90*       OT Long Term Goals - 06/15/19 1105      OT LONG TERM GOAL #1   Title  Pt to improve function LUE as evidenced by performing 18 on Box & Blocks test LUE -    Baseline  12    Period  Weeks    Status  Achieved   22 blocks     OT LONG TERM GOAL #2   Title  Pt to improve LUE coordination as  evidenced by placing 9 pegs in pegboard in 2 min. or under    Baseline  only placed 1 peg in 2 min    Time  8    Period  Weeks    Status  Not Met   1 peg placed in 2 mins     OT LONG TERM GOAL #3   Title  Pt to consistently demo mid level reaching w/ min compensations to reduce pain LUE-07/22/19    Time  5    Period  Weeks    Status  On-going   inconsitent min-mod compensations     OT LONG TERM GOAL #4   Title  Pt able to tie shoelaces with extra time prn    Time  8    Period  Weeks    Status  On-going      OT LONG TERM GOAL #5   Title  Pt will demonstrate improved LUE functional use as evidenced by performing 25 blocks on box/ blocks    Baseline  22 blocks 06/15/19    Time  5    Period  Weeks    Status  New    Target Date  07/22/19      OT LONG TERM GOAL #6   Title  Pt will demonstrate improved fine motor coordination for ADLS as evidenced by placing 3 pegs in 9-hole pegboard in 2 mins    Time  5    Period  Weeks    Status  New   baseline-1 peg placed in 2 mins     OT LONG TERM GOAL #7   Title  Pt will retrieve a lightweight object at 100* shoulder flexion with no more than min compensations.    Time  5    Period  Weeks    Status  New            Plan - 07/05/19 6294  Clinical Impression Statement  Pt is progressing towards goals with improving normal movement patterns after repetition.    Occupational Profile and client history currently impacting functional performance  PMH; HTN, HLD, DM, A-fib. Current deficits impeding pt's ability to perform ADLS, IADLS, and role as husband, friend, and leisure participation    Occupational performance deficits (Please refer to evaluation for details):  ADL's;IADL's;Leisure    Body Structure / Function / Physical Skills  ADL;Decreased knowledge of precautions;ROM;UE functional use;FMC;Decreased knowledge of use of DME;Balance;Sensation;Endurance;Pain;Strength;Coordination;IADL;Proprioception;Tone;Mobility    Cognitive Skills   Attention;Safety Awareness;Memory    Rehab Potential  Good    OT Frequency  2x / week    OT Duration  --   5 weeks   OT Treatment/Interventions  Self-care/ADL training;Moist Heat;DME and/or AE instruction;Splinting;Therapeutic activities;Psychosocial skills training;Aquatic Therapy;Therapeutic exercise;Cognitive remediation/compensation;Coping strategies training;Neuromuscular education;Functional Mobility Training;Passive range of motion;Visual/perceptual remediation/compensation;Manual Therapy;Patient/family education;Electrical Stimulation    Plan  continue to address functional reach and normal movement patterns.    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 Chronic left shoulder pain 05/12/2019  . Subacromial bursitis of left shoulder joint 09/10/2018  . Spastic hemiplegia affecting nondominant side ([place]) 07/07/2018  . Abnormality of gait 06/09/2018  . Neuropathic pain   . Labile blood pressure   . PAF (paroxysmal atrial fibrillation) ([place])   . Hemiparesis affecting left side as late effect of stroke  ([place])   . 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 Benign essential HTN   . Dyslipidemia   . Hemorrhagic stroke ([place])   . ICH (intracerebral hemorrhage) ([place]) 03/12/2018  . Prostate cancer ([place]) 01/29/2017  . 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 Diabetes ([place]) 12/14/2015  . PCP NOTES >>>>>>>>>>>>>>>>>>>>>>>>>>>>>>. 08/06/2015  . Dizziness and giddiness 01/29/2015  . Umbilical hernia 30/03/4044  . Elevated PSA, less than 10 ng/ml 04/19/2012  . Annual physical exam 01/24/2011  . Hyperlipidemia 01/03/2010  . Essential hypertension 09/20/2007    Marcus Beasley 07/05/2019, 8:28 AM  [facility] [address] [place], [place], [address] Phone: [phone]   Fax:  [phone]  Name: Marcus Beasley MRN: [id number] Date of Birth: 1945-08-14

## 2019-07-07 ENCOUNTER — Ambulatory Visit: Payer: Medicare Other | Admitting: Physical Therapy

## 2019-07-07 ENCOUNTER — Encounter: Payer: Self-pay | Admitting: Physical Therapy

## 2019-07-07 ENCOUNTER — Other Ambulatory Visit: Payer: Self-pay

## 2019-07-07 DIAGNOSIS — R2689 Other abnormalities of gait and mobility: Secondary | ICD-10-CM

## 2019-07-07 DIAGNOSIS — R278 Other lack of coordination: Secondary | ICD-10-CM | POA: Diagnosis not present

## 2019-07-07 DIAGNOSIS — I69118 Other symptoms and signs involving cognitive functions following nontraumatic intracerebral hemorrhage: Secondary | ICD-10-CM | POA: Diagnosis not present

## 2019-07-07 DIAGNOSIS — M6281 Muscle weakness (generalized): Secondary | ICD-10-CM

## 2019-07-07 DIAGNOSIS — R2681 Unsteadiness on feet: Secondary | ICD-10-CM | POA: Diagnosis not present

## 2019-07-07 DIAGNOSIS — I69254 Hemiplegia and hemiparesis following other nontraumatic intracranial hemorrhage affecting left non-dominant side: Secondary | ICD-10-CM

## 2019-07-07 DIAGNOSIS — R209 Unspecified disturbances of skin sensation: Secondary | ICD-10-CM | POA: Diagnosis not present

## 2019-07-08 NOTE — Therapy (Signed)
[facility] [address] [place], [place], [address] Phone: [phone]   Fax:  [phone]  Physical Therapy Treatment  Patient Details  Name: Marcus Beasley MRN: [id number] Date of Birth: 10/07/1945 Referring Provider (PT): Posey Pronto   Encounter Date: 07/07/2019    07/07/19 0851  PT Visits / Re-Eval  Visit Number 10  Number of Visits 17  Date for PT Re-Evaluation 07/10/19  Authorization  Authorization Type U[employer] Medicare; will need 10th visit progress note  PT Time Calculation  PT Start Time 0848  PT Stop Time 0930  PT Time Calculation (min) 42 min  PT - End of Session  Equipment Utilized During Treatment Gait belt  Activity Tolerance Patient tolerated treatment well  Behavior During Therapy [facility] for tasks assessed/performed    Past Medical History:  Diagnosis Date  . BPH (benign prostatic hyperplasia)    (-) Bx 2015  . Diabetes mellitus without complication ([place])   . Elevated PSA    Prostate Bx in 06/2013 was benign  . HTN (hypertension)   . Hyperlipidemia   . Macular degeneration, age related     Past Surgical History:  Procedure Laterality Date  . ABCESS DRAINAGE     abdomen- 26 day hospitalization 1968  . COLONOSCOPY  2016  . HERNIA REPAIR  summer '11   umbilical, dr Ninfa Linden   . POLYPECTOMY    . PROSTATE BIOPSY  06-2013 , 07-2017   (-), (-)    There were no vitals filed for this visit.     07/07/19 0850  Symptoms/Limitations  Subjective No new complaints. No falls or pain to report. Was sore after last session, gone by the next day.  Patient Stated Goals I'd like to be able towalk a little better.  Pain Assessment  Currently in Pain? No/denies  Pain Score 0      07/07/19 0856  Transfers  Transfers Sit to Stand;Stand to Sit  Sit to Stand 6: Modified independent (Device/Increase time);Without upper extremity assist;From chair/3-in-1;From bed  Stand to Sit 6: Modified independent  (Device/Increase time);Without upper extremity assist;To chair/3-in-1;To bed  Ambulation/Gait  Ambulation/Gait Yes  Ambulation/Gait Assistance 6: Modified independent (Device/Increase time);5: Supervision  Ambulation/Gait Assistance Details no balance or instability noted with gait  Ambulation Distance (Feet) 1000 Feet (x1)  Assistive device Straight cane (with rubber quad tip)  Gait Pattern Step-through pattern;Decreased arm swing - left;Decreased step length - left;Decreased stance time - left;Decreased dorsiflexion - left;Decreased weight shift to left;Trunk flexed  Ambulation Surface Level;Unlevel;Indoor;Outdoor  Exercises  Exercises Other Exercises  Other Exercises  for left LE strengthening: seated at corner of mat in stride position with right foot forward/left foot in back-sit<>stands for 10 reps no UE support; on red mat on floor: tall kneeling mini squats for 10 reps, in half kneeling with right foot forward- lifting left knee up into extension while pushing heel down toward ground, then lowering knee back down for 10 reps. assist needed for left LE control/stability with ex  Knee/Hip Exercises: Aerobic  Tread Mill X6 minutes with bil to single UE support as pt self set up/adjusted settings with supervision           PT Short Term Goals - 05/26/19 1700      PT SHORT TERM GOAL #1   Title  Pt will be independent with HEP for improved strength, balance, gait.  TARGET 4 weeks, 05/13/19 (may be modified due to delayed scheduling from eval)    Baseline  05/26/19: met with  current program, updated HEP today    Status  Achieved      PT SHORT TERM GOAL #2   Title  Pt will improve DGI score to at least 15/24 for decreased fall risk.    Baseline  DGI 12/24 04/11/2019 eval; 14/24 05/24/2019    Status  Partially Met      PT SHORT TERM GOAL #3   Title  Pt will improve 5x sit<>stand to less than or equal to 13 seconds for improved transfer efficiency and functional strength.     Baseline  14.47 sec at best    Status  Not Met      PT SHORT TERM GOAL #4   Title  Pt will improve TUG score to less than or equal to 15 seconds for decreased fall risk.    Baseline  15.75 sec 05/24/2019    Status  Partially Met        PT Long Term Goals - 07/07/19 0854      PT LONG TERM GOAL #1   Title  Pt will verbalize plans for progress of HPE, including continued community fitness upon d/c from PT.  TARGET 8 weeks, continue to work toward goals through [place] with end date of 07/10/19 per PT    Baseline  will need to be updated at discharge    Status  On-going      PT LONG TERM GOAL #2   Title  Pt will improve DGI to at least 19/24 for decreased fall risk.    Baseline  18/24 on 06/15/19    Time  8    Period  Weeks    Status  On-going      PT LONG TERM GOAL #3   Title  Pt will improve gait velocity score to at least 2.3 ft/sec for improved gait efficiency and safety.    Baseline  2.75 ft/secon with quad tip cane on 06/15/19    Status  Achieved      PT LONG TERM GOAL #4   Title  Pt will improve TUG score to less than or equal to 13.5 seconds for decreased fall risk.    Baseline  11.75 sec with quad tip straight cane on 06/15/19    Status  Achieved      PT LONG TERM GOAL #5   Title  Pt will ambulate at least 1000 ft using cane/no device, modified independently, for improved independence with gait on indoor and outdoor surfaces    Time  8    Period  Weeks    Status  On-going      PT LONG TERM GOAL #6   Title  Pt will negotiate at least 12 steps, one handrail, step-through pattern, modified independently, for improved stair negotiation in home.    Status  Achieved         07/07/19 0851  Plan  Clinical Impression Statement Today's skilled session continued to focus on gait with LTG for outdoor gait met. Remainder of session addressed use of treadmil for at home with HEP and strengthening/NMR with no issues reported. The pt is making steady progress toward goals.  Personal  Factors and Comorbidities Comorbidity 3+  Comorbidities See PMH  Examination-Activity Limitations Stairs;Locomotion Level;Transfers  Examination-Participation Restrictions Community Activity;Yard Work;Meal Prep  Pt will benefit from skilled therapeutic intervention in order to improve on the following deficits Abnormal gait;Decreased activity tolerance;Decreased balance;Decreased mobility;Difficulty walking;Decreased strength;Impaired flexibility;Impaired tone  Stability/Clinical Decision Making Evolving/Moderate complexity  Rehab Potential Good  Clinical Impairments Affecting Rehab  Potential good family support; independnet prior to CVA  PT Frequency 2x / week  PT Duration 8 weeks (plus eval)  PT Treatment/Interventions ADLs/Self Care Home Management;Electrical Stimulation;Therapeutic exercise;Therapeutic activities;Functional mobility training;Gait training;Stair training;DME Instruction;Balance training;Neuromuscular re-education;Patient/family education;Orthotic Fit/Training;Manual techniques;Aquatic Therapy  PT Next Visit Plan assess LTGs for anticipated discharge, update HEP if needed  PT Home Exercise Plan Access Code: [email or web address]  Consulted and Agree with Plan of Care Patient          Patient will benefit from skilled therapeutic intervention in order to improve the following deficits and impairments:  Abnormal gait, Decreased activity tolerance, Decreased balance, Decreased mobility, Difficulty walking, Decreased strength, Impaired flexibility, Impaired tone  Visit Diagnosis: 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 Chronic left shoulder pain 05/12/2019  . Subacromial bursitis of left shoulder joint 09/10/2018  . Spastic  hemiplegia affecting nondominant side ([place]) 07/07/2018  . Abnormality of gait 06/09/2018  . Neuropathic pain   . Labile blood pressure   . PAF (paroxysmal atrial fibrillation) ([place])   . Hemiparesis affecting left side as late effect of stroke ([place])   . 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 Benign essential HTN   . Dyslipidemia   . Hemorrhagic stroke ([place])   . ICH (intracerebral hemorrhage) ([place]) 03/12/2018  . Prostate cancer ([place]) 01/29/2017  . 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 Diabetes ([place]) 12/14/2015  . PCP NOTES >>>>>>>>>>>>>>>>>>>>>>>>>>>>>>. 08/06/2015  . Dizziness and giddiness 01/29/2015  . Umbilical hernia 51/70/0174  . Elevated PSA, less than 10 ng/ml 04/19/2012  . Annual physical exam 01/24/2011  . Hyperlipidemia 01/03/2010  . Essential hypertension 09/20/2007    Willow Ora, PTA, [place] [address] [place], [place] [address] [phone] 07/08/19, 11:43 PM   Name: Marcus Beasley MRN: [id number] Date of Birth: 1946-06-15

## 2019-07-11 ENCOUNTER — Encounter: Payer: Self-pay | Admitting: Internal Medicine

## 2019-07-12 ENCOUNTER — Ambulatory Visit: Payer: Medicare Other | Admitting: Occupational Therapy

## 2019-07-12 ENCOUNTER — Other Ambulatory Visit: Payer: Self-pay

## 2019-07-12 DIAGNOSIS — R278 Other lack of coordination: Secondary | ICD-10-CM

## 2019-07-12 DIAGNOSIS — R209 Unspecified disturbances of skin sensation: Secondary | ICD-10-CM | POA: Diagnosis not present

## 2019-07-12 DIAGNOSIS — M6281 Muscle weakness (generalized): Secondary | ICD-10-CM

## 2019-07-12 DIAGNOSIS — I69254 Hemiplegia and hemiparesis following other nontraumatic intracranial hemorrhage affecting left non-dominant side: Secondary | ICD-10-CM | POA: Diagnosis not present

## 2019-07-12 DIAGNOSIS — R2681 Unsteadiness on feet: Secondary | ICD-10-CM | POA: Diagnosis not present

## 2019-07-12 DIAGNOSIS — R208 Other disturbances of skin sensation: Secondary | ICD-10-CM

## 2019-07-12 DIAGNOSIS — I69118 Other symptoms and signs involving cognitive functions following nontraumatic intracerebral hemorrhage: Secondary | ICD-10-CM

## 2019-07-12 DIAGNOSIS — R2689 Other abnormalities of gait and mobility: Secondary | ICD-10-CM | POA: Diagnosis not present

## 2019-07-12 NOTE — Therapy (Signed)
[facility] [address] [place], [place], [address] Phone: [phone]   Fax:  [phone]  Occupational Therapy Treatment  Patient Details  Name: Marcus Beasley MRN: [id number] Date of Birth: 01/08/1946 Referring Provider (OT): Ankit Patel   Encounter Date: 07/12/2019  OT End of Session - 07/12/19 0810    Visit Number  14    Number of Visits  17    Date for OT Re-Evaluation  07/22/19    Authorization Type  U[employer] MCR    Authorization - Visit Number  14    Authorization - Number of Visits  20    OT Start Time  0803    OT Stop Time  0842    OT Time Calculation (min)  39 min    Activity Tolerance  Patient tolerated treatment well    Behavior During Therapy  WFL for tasks assessed/performed       Past Medical History:  Diagnosis Date  . BPH (benign prostatic hyperplasia)    (-) Bx 2015  . Diabetes mellitus without complication (HCC)   . Elevated PSA    Prostate Bx in 06/2013 was benign  . HTN (hypertension)   . Hyperlipidemia   . Macular degeneration, age related     Past Surgical History:  Procedure Laterality Date  . ABCESS DRAINAGE     abdomen- 26 day hospitalization 1968  . COLONOSCOPY  2016  . HERNIA REPAIR  summer '11   umbilical, dr Blackman   . POLYPECTOMY    . PROSTATE BIOPSY  06-2013 , 07-2017   (-), (-)    There were no vitals filed for this visit.  Subjective Assessment - 07/12/19 0809    Subjective   No denies pain    Pertinent History  Rt thalamic hemorrhage 03/12/18. PMH: HTN, HLD, DM, A-fib    Limitations  Fall risk    Patient Stated Goals  get more function in my Lt arm and hand    Currently in Pain?  No/denies    Pain Onset  More than a month ago         treatment: supine closed chain shoulder flexion and chest press, min facilitation followed by seated low range closed chain shoulder flexion, mod facilitation. Standing closed chain unilateral shoulder flexion with UE ranger mid  range, min-mod facilitation. Seated at tabletop placing large, medium and small pegs in semi circle with LUE, min -mod difficulty/ v.c                    OT Short Term Goals - 06/15/19 1057      OT SHORT TERM GOAL #1   Title  Pt independent with updated LUE HEP for neuro re-education - 05/12/19    Time  --    Period  Weeks    Status  Achieved      OT SHORT TERM GOAL #2   Title  Pt independent with updated Lt hand    Time  --    Period  Weeks    Status  Achieved      OT SHORT TERM GOAL #3   Title  Pt to verbalize understanding with safety considerations LUE d/t lack of sensation    Time  --    Period  Weeks    Status  Achieved      OT SHORT TERM GOAL #4   Title  Pt to verbalize understanding of A/E for shoelaces and cooking    Time  --      Period  Weeks    Status  Deferred   Pt shown A/E but not interested in pursuing     OT SHORT TERM GOAL #5   Title  Pt to consistently perform UE dressing (including jacket) and bathing Rt arm fully at mod I level    Baseline  min assist    Time  --    Period  Weeks    Status  Achieved      OT SHORT TERM GOAL #6   Title  Pt to achieve 90* shoulder flexion LUE w/ min compensations and pain 3/10 or under    Time  --    Period  Weeks    Status  Not Met   mod compensations with 90*       OT Long Term Goals - 06/15/19 1105      OT LONG TERM GOAL #1   Title  Pt to improve function LUE as evidenced by performing 18 on Box & Blocks test LUE -    Baseline  12    Period  Weeks    Status  Achieved   22 blocks     OT LONG TERM GOAL #2   Title  Pt to improve LUE coordination as evidenced by placing 9 pegs in pegboard in 2 min. or under    Baseline  only placed 1 peg in 2 min    Time  8    Period  Weeks    Status  Not Met   1 peg placed in 2 mins     OT LONG TERM GOAL #3   Title  Pt to consistently demo mid level reaching w/ min compensations to reduce pain LUE-07/22/19    Time  5    Period  Weeks    Status   On-going   inconsitent min-mod compensations     OT LONG TERM GOAL #4   Title  Pt able to tie shoelaces with extra time prn    Time  8    Period  Weeks    Status  On-going      OT LONG TERM GOAL #5   Title  Pt will demonstrate improved LUE functional use as evidenced by performing 25 blocks on box/ blocks    Baseline  22 blocks 06/15/19    Time  5    Period  Weeks    Status  New    Target Date  07/22/19      OT LONG TERM GOAL #6   Title  Pt will demonstrate improved fine motor coordination for ADLS as evidenced by placing 3 pegs in 9-hole pegboard in 2 mins    Time  5    Period  Weeks    Status  New   baseline-1 peg placed in 2 mins     OT LONG TERM GOAL #7   Title  Pt will retrieve a lightweight object at 100* shoulder flexion with no more than min compensations.    Time  5    Period  Weeks    Status  New            Plan - 07/12/19 1152    Clinical Impression Statement  Pt is progressing towards goals with improving normal movement patterns after repetition.    Occupational Profile and client history currently impacting functional performance  PMH; HTN, HLD, DM, A-fib. Current deficits impeding pt's ability to perform ADLS, IADLS, and role as husband, friend, and leisure participation    Occupational performance  deficits (Please refer to evaluation for details):  ADL's;IADL's;Leisure    Body Structure / Function / Physical Skills  ADL;Decreased knowledge of precautions;ROM;UE functional use;FMC;Decreased knowledge of use of DME;Balance;Sensation;Endurance;Pain;Strength;Coordination;IADL;Proprioception;Tone;Mobility    Cognitive Skills  Attention;Safety Awareness;Memory    Rehab Potential  Good    OT Frequency  2x / week    OT Duration  --   5 weeks   OT Treatment/Interventions  Self-care/ADL training;Moist Heat;DME and/or AE instruction;Splinting;Therapeutic activities;Psychosocial skills training;Aquatic Therapy;Therapeutic exercise;Cognitive  remediation/compensation;Coping strategies training;Neuromuscular education;Functional Mobility Training;Passive range of motion;Visual/perceptual remediation/compensation;Manual Therapy;Patient/family education;Electrical Stimulation    Plan  continue to address functional reach and normal movement patterns.    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 Chronic left shoulder pain 05/12/2019  . Subacromial bursitis of left shoulder joint 09/10/2018  . Spastic hemiplegia affecting nondominant side ([place]) 07/07/2018  . Abnormality of gait 06/09/2018  . Neuropathic pain   . Labile blood pressure   . PAF (paroxysmal atrial fibrillation) ([place])   . Hemiparesis affecting left side as late effect of stroke ([place])   . 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 Benign essential HTN   . Dyslipidemia   . Hemorrhagic stroke ([place])   . ICH (intracerebral hemorrhage) ([place]) 03/12/2018  . Prostate cancer ([place]) 01/29/2017  . 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 Diabetes ([place]) 12/14/2015  . PCP NOTES >>>>>>>>>>>>>>>>>>>>>>>>>>>>>>. 08/06/2015  .  Dizziness and giddiness 01/29/2015  . Umbilical hernia 74/05/8785  . Elevated PSA, less than 10 ng/ml 04/19/2012  . Annual physical exam 01/24/2011  . Hyperlipidemia 01/03/2010  . Essential hypertension 09/20/2007    Tank [place] 07/12/2019, 11:53 AM  [facility] [address] [place], [place], [address] Phone: [phone]   Fax:  [phone]  Name: Marcus Beasley MRN: [id number] Date of Birth: Aug 21, 1945

## 2019-07-14 ENCOUNTER — Ambulatory Visit: Payer: Medicare Other | Admitting: Occupational Therapy

## 2019-07-14 ENCOUNTER — Ambulatory Visit: Payer: Medicare Other | Admitting: Physical Therapy

## 2019-07-14 ENCOUNTER — Encounter: Payer: Self-pay | Admitting: Physical Therapy

## 2019-07-14 ENCOUNTER — Other Ambulatory Visit: Payer: Self-pay

## 2019-07-14 DIAGNOSIS — R2681 Unsteadiness on feet: Secondary | ICD-10-CM

## 2019-07-14 DIAGNOSIS — R208 Other disturbances of skin sensation: Secondary | ICD-10-CM

## 2019-07-14 DIAGNOSIS — M6281 Muscle weakness (generalized): Secondary | ICD-10-CM

## 2019-07-14 DIAGNOSIS — I69118 Other symptoms and signs involving cognitive functions following nontraumatic intracerebral hemorrhage: Secondary | ICD-10-CM | POA: Diagnosis not present

## 2019-07-14 DIAGNOSIS — I69254 Hemiplegia and hemiparesis following other nontraumatic intracranial hemorrhage affecting left non-dominant side: Secondary | ICD-10-CM | POA: Diagnosis not present

## 2019-07-14 DIAGNOSIS — R2689 Other abnormalities of gait and mobility: Secondary | ICD-10-CM | POA: Diagnosis not present

## 2019-07-14 DIAGNOSIS — R209 Unspecified disturbances of skin sensation: Secondary | ICD-10-CM | POA: Diagnosis not present

## 2019-07-14 DIAGNOSIS — R278 Other lack of coordination: Secondary | ICD-10-CM

## 2019-07-15 NOTE — Therapy (Signed)
[facility] [address] [place], [place], [address] Phone: [phone]   Fax:  [phone]  Physical Therapy Treatment  Patient Details  Name: Marcus Beasley MRN: [id number] Date of Birth: [birth year] Referring Provider (PT): Posey Pronto   Encounter Date: 07/14/2019  PT End of Session - 07/14/19 0806    Visit Number  11    Number of Visits  17    Date for PT Re-Evaluation  07/10/19    Authorization Type  [employer] Medicare; will need 10th visit progress note    PT Start Time  0804    PT Stop Time  0845    PT Time Calculation (min)  41 min    Equipment Utilized During Treatment  Gait belt    Activity Tolerance  Patient tolerated treatment well    Behavior During Therapy  [facility] for tasks assessed/performed       Past Medical History:  Diagnosis Date  . BPH (benign prostatic hyperplasia)    (-) Bx 2015  . Diabetes mellitus without complication ([place])   . Elevated PSA    Prostate Bx in 06/2013 was benign  . HTN (hypertension)   . Hyperlipidemia   . Macular degeneration, age related     Past Surgical History:  Procedure Laterality Date  . ABCESS DRAINAGE     abdomen- 26 day hospitalization 1968  . COLONOSCOPY  2016  . HERNIA REPAIR  summer '11   umbilical, dr Ninfa Linden   . POLYPECTOMY    . PROSTATE BIOPSY  06-2013 , 07-2017   (-), (-)    There were no vitals filed for this visit.  Subjective Assessment - 07/14/19 0804    Subjective  No new complaitns. No falls or pain report.    Patient Stated Goals  I'd like to be able towalk a little better.    Currently in Pain?  No/denies    Pain Score  0-No pain         [facility] PT Assessment - 07/14/19 0808      Transfers   Transfers  Sit to Stand;Stand to Sit    Sit to Stand  6: Modified independent (Device/Increase time);Without upper extremity assist;From chair/3-in-1;From bed    Stand to Sit  6: Modified independent (Device/Increase time);Without upper extremity assist;To  chair/3-in-1;To bed               [facility] Adult PT Treatment/Exercise - 07/14/19 0808      Ambulation/Gait   Ambulation/Gait Assistance  6: Modified independent (Device/Increase time);5: Supervision    Assistive device  Straight cane;None    Gait Pattern  Step-through pattern;Decreased arm swing - left;Decreased step length - left;Decreased stance time - left;Decreased dorsiflexion - left;Decreased weight shift to left;Trunk flexed    Ambulation Surface  Level;Indoor    Gait velocity  9.72 sec's no AD= 3.37 ft/sec no AD    Stairs  Yes    Stairs Assistance  6: Modified independent (Device/Increase time)    Stair Management Technique  One rail Left;Alternating pattern;Forwards    Number of Stairs  4   x3 reps     Dynamic Gait Index   Level Surface  Normal    Change in Gait Speed  Normal    Gait with Horizontal Head Turns  Normal    Gait with Vertical Head Turns  Normal    Gait and Pivot Turn  Normal    Step Over Obstacle  Mild Impairment    Step Around Obstacles  Normal  Steps  Mild Impairment    Total Score  22      Timed Up and Go Test   TUG  Normal TUG    Normal TUG (seconds)  11.34   no AD     added to HEP today:  Alternating forward lunges, wave squats and single leg heel raises. All with UE support for balance with cues on form/technique.        PT Education - 07/14/19 1630    Education Details  progress toward goals; updated HEP    Person(s) Educated  Patient    Methods  Explanation;Demonstration;Verbal cues;Handout    Comprehension  Verbalized understanding;Returned demonstration       PT Short Term Goals - 05/26/19 1700      PT SHORT TERM GOAL #1   Title  Pt will be independent with HEP for improved strength, balance, gait.  TARGET 4 weeks, 05/13/19 (may be modified due to delayed scheduling from eval)    Baseline  05/26/19: met with current program, updated HEP today    Status  Achieved      PT SHORT TERM GOAL #2   Title  Pt will improve DGI  score to at least 15/24 for decreased fall risk.    Baseline  DGI 12/24 04/11/2019 eval; 14/24 05/24/2019    Status  Partially Met      PT SHORT TERM GOAL #3   Title  Pt will improve 5x sit<>stand to less than or equal to 13 seconds for improved transfer efficiency and functional strength.    Baseline  14.47 sec at best    Status  Not Met      PT SHORT TERM GOAL #4   Title  Pt will improve TUG score to less than or equal to 15 seconds for decreased fall risk.    Baseline  15.75 sec 05/24/2019    Status  Partially Met        PT Long Term Goals - 07/14/19 0806      PT LONG TERM GOAL #1   Title  Pt will verbalize plans for progress of HPE, including continued community fitness upon d/c from PT.  TARGET 8 weeks, continue to work toward goals through [place] with end date of 07/10/19 per PT    Baseline  07/14/19: met in session today    Status  Achieved      PT LONG TERM GOAL #2   Title  Pt will improve DGI to at least 19/24 for decreased fall risk.    Baseline  07/14/19: 22/24    Time  --    Period  --    Status  Achieved      PT LONG TERM GOAL #3   Title  Pt will improve gait velocity score to at least 2.3 ft/sec for improved gait efficiency and safety.    Baseline  07/14/19: 3.37 ft/sec    Status  Achieved      PT LONG TERM GOAL #4   Title  Pt will improve TUG score to less than or equal to 13.5 seconds for decreased fall risk.    Baseline  07/14/19: 11.34 sec's no AD    Status  Achieved      PT LONG TERM GOAL #5   Title  Pt will ambulate at least 1000 ft using cane/no device, modified independently, for improved independence with gait on indoor and outdoor surfaces    Baseline  07/07/19: met in session today with intermittent use of cane  Status  Achieved      PT LONG TERM GOAL #6   Title  Pt will negotiate at least 12 steps, one handrail, step-through pattern, modified independently, for improved stair negotiation in home.    Status  Achieved            Plan - 07/14/19  0806    Clinical Impression Statement  Today's skilled session focused on progress toward goals with all LTGs met. Remainder of session addressed HEP with updates made, refer to [place] program for full details. Pt in agreement for discharge today.    Personal Factors and Comorbidities  Comorbidity 3+    Comorbidities  See PMH    Examination-Activity Limitations  Stairs;Locomotion Level;Transfers    Examination-Participation Restrictions  Community Activity;Yard Work;Meal Prep    Stability/Clinical Decision Making  Evolving/Moderate complexity    Rehab Potential  Good    Clinical Impairments Affecting Rehab Potential  good family support; independnet prior to CVA    PT Frequency  2x / week    PT Duration  8 weeks   plus eval   PT Treatment/Interventions  ADLs/Self Care Home Management;Electrical Stimulation;Therapeutic exercise;Therapeutic activities;Functional mobility training;Gait training;Stair training;DME Instruction;Balance training;Neuromuscular re-education;Patient/family education;Orthotic Fit/Training;Manual techniques;Aquatic Therapy    PT Next Visit Plan  discharge per PT plan of care    PT Home Exercise Plan  Access Code: [email or web address]    Consulted and Agree with Plan of Care  Patient       Patient will benefit from skilled therapeutic intervention in order to improve the following deficits and impairments:  Abnormal gait, Decreased activity tolerance, Decreased balance, Decreased mobility, Difficulty walking, Decreased strength, Impaired flexibility, Impaired tone  Visit Diagnosis: 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 Chronic left shoulder pain 05/12/2019  . Subacromial bursitis of left shoulder joint 09/10/2018  . Spastic  hemiplegia affecting nondominant side ([place]) 07/07/2018  . Abnormality of gait 06/09/2018  . Neuropathic pain   . Labile blood pressure   . PAF (paroxysmal atrial fibrillation) ([place])   . Hemiparesis affecting left side as late effect of stroke ([place])   . 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 Benign essential HTN   . Dyslipidemia   . Hemorrhagic stroke ([place])   . ICH (intracerebral hemorrhage) ([place]) 03/12/2018  . Prostate cancer ([place]) 01/29/2017  . 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 Diabetes ([place]) 12/14/2015  . PCP NOTES >>>>>>>>>>>>>>>>>>>>>>>>>>>>>>. 08/06/2015  . Dizziness and giddiness 01/29/2015  . Umbilical hernia 93/23/5573  . Elevated PSA, less than 10 ng/ml 04/19/2012  . Annual physical exam 01/24/2011  . Hyperlipidemia 01/03/2010  . Essential hypertension 09/20/2007    Willow Ora, PTA, [facility] [address], [place], [place] [address] [phone] 07/15/19, 12:05 PM   Name: YONIEL ARKWRIGHT MRN: [id number] Date of Birth: Jul 05, 1945

## 2019-07-15 NOTE — Therapy (Signed)
[facility] [address] [place], [place], [address] Phone: [phone]   Fax:  [phone]  Occupational Therapy Treatment  Patient Details  Name: Marcus Beasley MRN: [id number] Date of Birth: [birth year] Referring Provider (OT): Delice Lesch   Encounter Date: 07/14/2019    OCCUPATIONAL THERAPY DISCHARGE SUMMARY   Current functional level related to goals / functional outcomes: Pt made progress yet did not fully achieve all goals due to LUE sensory impairments   Remaining deficits: Decreased coordination, decreased sensation, decreased strength   Education / Equipment: Pt was educated regarding HEP and sensory precautions. Pt demonstrates understanding of all education.Pt requests d/c from OT today.                  OT End of Session - 07/14/19 1422    Visit Number  15    Number of Visits  17    Date for OT Re-Evaluation  07/22/19    Authorization Type  [employer] MCR    Authorization - Visit Number  15    Authorization - Number of Visits  20    OT Start Time  0017    OT Stop Time  0930    OT Time Calculation (min)  40 min    Activity Tolerance  Patient tolerated treatment well    Behavior During Therapy  WFL for tasks assessed/performed       Past Medical History:  Diagnosis Date  . BPH (benign prostatic hyperplasia)    (-) Bx 2015  . Diabetes mellitus without complication ([place])   . Elevated PSA    Prostate Bx in 06/2013 was benign  . HTN (hypertension)   . Hyperlipidemia   . Macular degeneration, age related     Past Surgical History:  Procedure Laterality Date  . ABCESS DRAINAGE     abdomen- 26 day hospitalization 1968  . COLONOSCOPY  2016  . HERNIA REPAIR  summer '11   umbilical, dr Ninfa Linden   . POLYPECTOMY    . PROSTATE BIOPSY  06-2013 , 07-2017   (-), (-)    There were no vitals filed for this visit.  Subjective Assessment - 07/14/19 0908    Subjective   No denies pain    Pertinent History  Rt thalamic hemorrhage 03/12/18. PMH: HTN, HLD, DM, A-fib    Limitations  Fall risk    Patient Stated Goals  get more function in my Lt arm and hand    Currently in Pain?  No/denies    Pain Onset  More than a month ago             treatment: supine closed chain shoulder flexion and chest press, min facilitation followed by seated low range closed chain shoulder flexion, mod facilitation. Therapist checked progress towards goals, pt requests d/c today. Placing graded clothespins on vertical antennae with LUE, min -mod difficulty                OT Short Term Goals - 06/15/19 1057      OT SHORT TERM GOAL #1   Title  Pt independent with updated LUE HEP for neuro re-education - 05/12/19    Time  --    Period  Weeks    Status  Achieved      OT SHORT TERM GOAL #2   Title  Pt independent with updated Lt hand    Time  --    Period  Weeks    Status  Achieved  OT SHORT TERM GOAL #3   Title  Pt to verbalize understanding with safety considerations LUE d/t lack of sensation    Time  --    Period  Weeks    Status  Achieved      OT SHORT TERM GOAL #4   Title  Pt to verbalize understanding of A/E for shoelaces and cooking    Time  --    Period  Weeks    Status  Deferred   Pt shown A/E but not interested in pursuing     OT SHORT TERM GOAL #5   Title  Pt to consistently perform UE dressing (including jacket) and bathing Rt arm fully at mod I level    Baseline  min assist    Time  --    Period  Weeks    Status  Achieved      OT SHORT TERM GOAL #6   Title  Pt to achieve 90* shoulder flexion LUE w/ min compensations and pain 3/10 or under    Time  --    Period  Weeks    Status  Not Met   mod compensations with 90*       OT Long Term Goals - 07/14/19 0909      OT LONG TERM GOAL #1   Title  Pt to improve function LUE as evidenced by performing 18 on Box & Blocks test LUE -    Baseline  12    Period  Weeks    Status  Achieved   22  blocks     OT LONG TERM GOAL #2   Title  Pt to improve LUE coordination as evidenced by placing 9 pegs in pegboard in 2 min. or under    Baseline  only placed 1 peg in 2 min    Time  8    Period  Weeks    Status  Not Met   1 peg placed in 2 mins     OT LONG TERM GOAL #3   Title  Pt to consistently demo mid level reaching w/ min compensations to reduce pain LUE-07/22/19    Time  5    Period  Weeks    Status  Partially Met   inconsitent min-mod compensations     OT LONG TERM GOAL #4   Title  Pt able to tie shoelaces with extra time prn    Time  8    Period  Weeks    Status  Not Met      OT LONG TERM GOAL #5   Title  Pt will demonstrate improved LUE functional use as evidenced by performing 25 blocks on box/ blocks    Baseline  22 blocks 06/15/19    Time  5    Period  Weeks    Status  Not Met   22 blocks, 26 blocks     OT LONG TERM GOAL #6   Title  Pt will demonstrate improved fine motor coordination for ADLS as evidenced by placing 3 pegs in 9-hole pegboard in 2 mins    Time  5    Period  Weeks    Status  Not Met   3 pegs in 2 mins and 21 secs     OT LONG TERM GOAL #7   Title  Pt will retrieve a lightweight object at 100* shoulder flexion with no more than min compensations.    Time  5    Period  Weeks    Status  Partially Met   100 with mod compensations.           Plan - 07/14/19 1424    Clinical Impression Statement  Pt demonstrates progress towards goasl however he did not fully achieve all goals due to sensory impairment. Pt requests d/c from OT today.    Occupational Profile and client history currently impacting functional performance  PMH; HTN, HLD, DM, A-fib. Current deficits impeding pt's ability to perform ADLS, IADLS, and role as husband, friend, and leisure participation    Occupational performance deficits (Please refer to evaluation for details):  ADL's;IADL's;Leisure    Body Structure / Function / Physical Skills  ADL;Decreased knowledge of  precautions;ROM;UE functional use;FMC;Decreased knowledge of use of DME;Balance;Sensation;Endurance;Pain;Strength;Coordination;IADL;Proprioception;Tone;Mobility    Cognitive Skills  Attention;Safety Awareness;Memory    Rehab Potential  Good    OT Frequency  2x / week    OT Duration  --   5 weeks   OT Treatment/Interventions  Self-care/ADL training;Moist Heat;DME and/or AE instruction;Splinting;Therapeutic activities;Psychosocial skills training;Aquatic Therapy;Therapeutic exercise;Cognitive remediation/compensation;Coping strategies training;Neuromuscular education;Functional Mobility Training;Passive range of motion;Visual/perceptual remediation/compensation;Manual Therapy;Patient/family education;Electrical Stimulation    Plan  d/c OT    Consulted and Agree with Plan of Care  Patient       Patient will benefit from skilled therapeutic intervention in order to improve the following deficits and impairments:   Body Structure / Function / Physical Skills: ADL, Decreased knowledge of precautions, ROM, UE functional use, FMC, Decreased knowledge of use of DME, Balance, Sensation, Endurance, Pain, Strength, Coordination, IADL, Proprioception, Tone, Mobility Cognitive Skills: Attention, Safety Awareness, Memory     Visit Diagnosis: 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 Chronic left shoulder pain 05/12/2019  . Subacromial bursitis of left shoulder joint 09/10/2018  . Spastic hemiplegia affecting nondominant side ([place]) 07/07/2018  . Abnormality of gait 06/09/2018  .  Neuropathic pain   . Labile blood pressure   . PAF (paroxysmal atrial fibrillation) ([place])   . Hemiparesis affecting left side as late effect of stroke ([place])   . 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 Benign essential HTN   . Dyslipidemia   . Hemorrhagic stroke ([place])   . ICH (intracerebral hemorrhage) ([place]) 03/12/2018  . Prostate cancer ([place]) 01/29/2017  . 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 Diabetes ([place]) 12/14/2015  . PCP NOTES >>>>>>>>>>>>>>>>>>>>>>>>>>>>>>. 08/06/2015  . Dizziness and giddiness 01/29/2015  . Umbilical hernia 08/15/3610  . Elevated PSA, less than 10 ng/ml 04/19/2012  . Annual physical exam 01/24/2011  . Hyperlipidemia 01/03/2010  . Essential hypertension 09/20/2007    Ruchi Stoney 07/15/2019, 8:25 AM Theone Murdoch, OTR/L [phone] Phone: [phone] 8:30 AM 07/15/19 [facility] [address] [place], [place], [address] Phone: [phone]   Fax:  [phone]  Name: Marcus Beasley MRN: [id number] Date of Birth: January 31, 1946

## 2019-07-18 ENCOUNTER — Other Ambulatory Visit: Payer: Self-pay | Admitting: Cardiology

## 2019-07-19 ENCOUNTER — Ambulatory Visit: Payer: Medicare Other | Admitting: Occupational Therapy

## 2019-07-21 ENCOUNTER — Encounter: Payer: Self-pay | Admitting: Physical Medicine & Rehabilitation

## 2019-07-21 ENCOUNTER — Other Ambulatory Visit: Payer: Self-pay

## 2019-07-21 ENCOUNTER — Encounter (HOSPITAL_BASED_OUTPATIENT_CLINIC_OR_DEPARTMENT_OTHER): Payer: Medicare Other | Admitting: Physical Medicine & Rehabilitation

## 2019-07-21 VITALS — BP 107/75 | HR 79 | Temp 97.7°F | Ht 71.0 in | Wt 169.0 lb

## 2019-07-21 DIAGNOSIS — I10 Essential (primary) hypertension: Secondary | ICD-10-CM | POA: Diagnosis not present

## 2019-07-21 DIAGNOSIS — G8110 Spastic hemiplegia affecting unspecified side: Secondary | ICD-10-CM | POA: Diagnosis not present

## 2019-07-21 DIAGNOSIS — G8114 Spastic hemiplegia affecting left nondominant side: Secondary | ICD-10-CM

## 2019-07-21 DIAGNOSIS — E119 Type 2 diabetes mellitus without complications: Secondary | ICD-10-CM | POA: Diagnosis not present

## 2019-07-21 NOTE — Progress Notes (Signed)
Botox: Procedure Note Patient Name: Marcus Beasley DOB: 06-27-1945 MRN: [id number]   Procedure: Botulinum toxin administration Guidance: EMG Diagnosis: Spastic left hemiparesis Attending: Delice Lesch, MD  Date: 07/21/2019  Informed consent: Risks, benefits & options of the procedure are explained to the patient (and/or family). The patient elects to proceed with procedure. Risks include but are not limited to weakness, respiratory distress, dry mouth, ptosis, antibody formation, worsening of some areas of function. Benefits include decreased abnormal muscle tone, improved hygiene and positioning, decreased skin breakdown and, in some cases, decreased pain. Options include conservative management with oral antispasticity agents, phenol chemodenervation of nerve or at motor nerve branches. More invasive options include intrathecal balcofen adminstration for appropriate candidates. Surgical options may include tendon lengthening or transposition or, rarely, dorsal rhizotomy.   History/Physical Examination: 74 y.o. right-handed male with history of diabetes mellitus, hypertension presents for follow up for right thalamic hemorrhage, now with some spasticity.   MAS:   1/4 neck extensors, 1/4 shoulder abductors, 1+/4 elbow flexors    Previous Treatments: Therapy/Range of motion Indication for guidance: Target active muscules  Procedure: Botulinum toxin was mixed with preservative free saline with a dilution of 1cc to 100 units. Targeted limb and muscles were identified. The skin was prepped with alcohol swabs and placement of needle tip in targeted muscle was confirmed using appropriate guidance. Prior to injection, positioning of needle tip outside of blood vessel was determined by pulling back on syringe plunger.  MUSCLE UNITS Left upper traps 100U Left Med Biceps 100U Left Med Pec 100U  Total units used: [id number]  Complications: None Plan: RTC 6 weeks  Kilo Eshelman Anil Kanna Dafoe 1:02 PM

## 2019-07-27 ENCOUNTER — Telehealth: Payer: Self-pay | Admitting: Cardiology

## 2019-07-27 NOTE — Telephone Encounter (Signed)
New message   Patient wants daughter to come with office visit with Dr. Martinique on 08/01/19. The patient has had a stroke and needs assistance with walking. Please advise.

## 2019-07-27 NOTE — Telephone Encounter (Signed)
Pt needs his daughter to attend his appt with him d/t difficulty with mobility. Pt has had a stroke and his daughter helps him get around.

## 2019-07-28 ENCOUNTER — Ambulatory Visit: Payer: Medicare Other | Admitting: Physical Medicine & Rehabilitation

## 2019-07-30 NOTE — Progress Notes (Signed)
Cardiology Office Note    Date:  08/01/2019   ID:  [name], [name] [birth year], MRN [id number]  PCP:  Colon Branch, MD  Cardiologist:  Dr. Martinique  Chief Complaint  Patient presents with  . Atrial Fibrillation    History of Present Illness:  Marcus Beasley is a 74 y.o. male seen for follow up Afib, HTN and prior hemorrhagic CVA. He has a  past medical history of hypertension, DM 2 and hyperlipidemia who presented on 03/12/2018 was facial droop, slurred speech and left leg weakness.  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  This was attributed to uncontrolled high blood pressure.  During the admission, patient developed paroxysmal atrial fibrillation and was placed on 180 mg daily of diltiazem. [place] Vasc score of 5. He was not placed on systemic anticoagulation due to the brain bleed.  Patient was eventually discharged to inpatient rehab and was  released from inpatient rehab on 04/14/2018. More recent note from Neurology noted that CT showed resolution of hemorrhage and that it would be safe to resume anticoagulation.   On follow up today he still notes stiffness on his left side. He has completed PT.  No chest pain, palpitations, dizziness, edema, SOB. No bleeding.   Past Medical History:  Diagnosis Date  . BPH (benign prostatic hyperplasia)    (-) Bx 2015  . Diabetes mellitus without complication ([place])   . Elevated PSA    Prostate Bx in 06/2013 was benign  . HTN (hypertension)   . Hyperlipidemia   . Macular degeneration, age related     Past Surgical History:  Procedure Laterality Date  . ABCESS DRAINAGE     abdomen- 26 day hospitalization 1968  . COLONOSCOPY  2016  . HERNIA REPAIR  summer '11   umbilical, dr Ninfa Linden   . POLYPECTOMY    . PROSTATE BIOPSY  06-2013 , 07-2017   (-), (-)    Current Medications: Outpatient Medications Prior to Visit  Medication Sig Dispense Refill  . amLODipine (NORVASC) 5 MG tablet TAKE 1 TABLET BY MOUTH EVERY DAY  90 tablet 1  . diltiazem (CARDIZEM CD) 240 MG 24 hr capsule TAKE 1 CAPSULE BY MOUTH EVERY DAY 90 capsule 1  . glucose blood (ONETOUCH VERIO) test strip CHECK BLOOD SUGAR NO MORE THAN TWICE DAILY 200 each 12  . meclizine (ANTIVERT) 25 MG tablet Take 1 tablet (25 mg total) by mouth 2 (two) times daily as needed for dizziness. 30 tablet 1  . metFORMIN (GLUCOPHAGE) 850 MG tablet TAKE 1 TABLET (850 MG TOTAL) BY MOUTH 2 (TWO) TIMES DAILY WITH A MEAL. 180 tablet 1  . Multiple Vitamin (MULTIVITAMIN WITH MINERALS) TABS tablet Take 1 tablet by mouth daily.     . Multiple Vitamins-Minerals (PRESERVISION/LUTEIN PO) Take 1 tablet by mouth daily.     . pregabalin (LYRICA) 100 MG capsule Take 1 capsule (100 mg total) by mouth 3 (three) times daily. 90 capsule 1  . tamsulosin (FLOMAX) 0.4 MG CAPS capsule Take 0.4 mg by mouth daily.    Marland Kitchen apixaban (ELIQUIS) 5 MG TABS tablet Take 1 tablet (5 mg total) by mouth 2 (two) times daily. 60 tablet 12  . atorvastatin (LIPITOR) 40 MG tablet Take 1 tablet (40 mg total) by mouth daily. Please make annual appt with Dr. Martinique for refills. [phone]. 1st attempt. 30 tablet 0  . metoprolol tartrate (LOPRESSOR) 50 MG tablet TAKE 1 TABLET BY MOUTH TWICE A DAY 180 tablet 1  No facility-administered medications prior to visit.     Allergies:   Patient has no known allergies.   Social History   Socioeconomic History  . Marital status: Married    Spouse name: Not on file  . Number of children: 3  . Years of education: 67  . Highest education level: Not on file  Occupational History  . 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 Smoking status: Never Smoker  . Smokeless tobacco: Never Used  Substance and Sexual Activity  . Alcohol use: Yes    Comment: occ. beer  . Drug use: No  . Sexual activity: Yes    Partners: Female  Other Topics Concern  . 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  Married - '69. 2 dtrs , 1 son - homicide. 5 grandchildren.     Household: pt, wife, daughter and son   Does not drive    Social Determinants of Radio broadcast assistant Strain:   . Difficulty of Paying Living Expenses: Not on file  Food Insecurity:   . Worried About Charity fundraiser in the Last Year: Not on file  . Ran Out of Food in the Last Year: Not on file  Transportation Needs:   . Lack of Transportation (Medical): Not on file  . Lack of Transportation (Non-Medical): Not on file  Physical Activity:   . Days of Exercise per Week: Not on file  . Minutes of Exercise per Session: Not on file  Stress:   . Feeling of Stress : Not on file  Social Connections:   . Frequency of Communication with Friends and Family: Not on file  . Frequency of Social Gatherings with Friends and Family: Not on file  . Attends Religious Services: Not on file  . Active Member of Clubs or Organizations: Not on file  . Attends Archivist Meetings: Not on file  . Marital Status: Not on file     Family History:  The patient's family history includes Cancer - Other in his sister; Diabetes in his maternal grandmother; Heart attack in his father; Heart disease in his brother and sister; Kidney disease in his brother; Leukemia in his mother.   ROS:   Please see the history of present illness.    ROS All other systems reviewed and are negative.   PHYSICAL EXAM:   VS:  BP 120/70   Pulse 73   Temp 97.9 F (36.6 C)   Ht 5\' 11"  (1.803 m)   Wt 171 lb (77.6 kg)   SpO2 94%   BMI 23.85 kg/m    GENERAL:  Well appearing BM in NAD HEENT:  PERRL, EOMI, sclera are clear. Oropharynx is clear. NECK:  No jugular venous distention, carotid upstroke brisk and symmetric, no bruits, no thyromegaly or adenopathy LUNGS:  Clear to auscultation bilaterally CHEST:  Unremarkable HEART:  RRR,  PMI not displaced or sustained,S1 and S2 within normal limits, no S3, no S4: no clicks, no rubs, no murmurs ABD:   Soft, nontender. BS +, no masses or bruits. No hepatomegaly, no splenomegaly EXT:  2 + pulses throughout, no edema, no cyanosis no clubbing SKIN:  Warm and dry.  No rashes NEURO:  Alert and oriented x 3. Cranial nerves II through XII intact. Some left sided weakness persists. PSYCH:  Cognitively intact    Wt Readings from Last 3 Encounters:  08/01/19 171 lb (77.6 kg)  07/21/19 169 lb (76.7 kg)  06/30/19 169 lb (76.7 kg)      Studies/Labs Reviewed:   EKG:  EKG is not ordered today.    Recent Labs: 01/20/2019: Hemoglobin 14.4; Platelets 303 06/02/2019: ALT 19; BUN 14; Creatinine, Ser 0.93; Potassium 4.6; Sodium 138; TSH 0.83   Lipid Panel    Component Value Date/Time   CHOL 125 06/02/2019 1036   CHOL 141 01/20/2019 0922   TRIG 66.0 06/02/2019 1036   HDL 43.20 06/02/2019 1036   HDL 44 01/20/2019 0922   CHOLHDL 3 06/02/2019 1036   VLDL 13.2 06/02/2019 1036   LDLCALC 69 06/02/2019 1036   LDLCALC 85 01/20/2019 0922    Additional studies/ records that were reviewed today include:   Echo 03/13/2018 LV EF: 65% -   70% Study Conclusions  - Left ventricle: The cavity size was normal. Wall thickness was   increased in a pattern of mild LVH. Systolic function was   vigorous. The estimated ejection fraction was in the range of 65%   to 70%. Wall motion was normal; there were no regional wall   motion abnormalities. - Aortic valve: There was trivial regurgitation.    ASSESSMENT:    1. PAF (paroxysmal atrial fibrillation) ([place])   2. H/O: CVA (cerebrovascular accident)   3. Essential hypertension   4. Hyperlipidemia, unspecified hyperlipidemia type      PLAN:  In order of problems listed above:  1. PAF: continue  Eliquis 5 mg bid long term. Continue BP control. Asymptomatic.   2. History of CVA: s/p hemorrhagic stroke involving right thalamus. Some residual left upper extremity stiffness but overall good recovery. Continue Eliquis  3. Hypertension: Blood pressure is  well controlled.   4. Hyperlipidemia: LDL 69. Goal < 70. Continue lipitor 40 mg   5. DM2: Managed by primary care provider.  On metformin. A1c 6.1%.   I will see in one year   Medication Adjustments/Labs and Tests Ordered: Current medicines are reviewed at length with the patient today.  Concerns regarding medicines are outlined above.  Medication changes, Labs and Tests ordered today are listed in the Patient Instructions below. There are no Patient Instructions on file for this visit.   Signed, Janese Radabaugh Martinique, MD  08/01/2019 4:01 PM    [facility] [place], [place], [place]  [address] Phone: [phone]; Fax: [phone]

## 2019-08-01 ENCOUNTER — Ambulatory Visit: Payer: Medicare Other | Admitting: Cardiology

## 2019-08-01 ENCOUNTER — Encounter: Payer: Self-pay | Admitting: Cardiology

## 2019-08-01 ENCOUNTER — Other Ambulatory Visit: Payer: Self-pay

## 2019-08-01 VITALS — BP 120/70 | HR 73 | Temp 97.9°F | Ht 71.0 in | Wt 171.0 lb

## 2019-08-01 DIAGNOSIS — I480 Paroxysmal atrial fibrillation: Secondary | ICD-10-CM | POA: Diagnosis not present

## 2019-08-01 DIAGNOSIS — E785 Hyperlipidemia, unspecified: Secondary | ICD-10-CM

## 2019-08-01 DIAGNOSIS — I10 Essential (primary) hypertension: Secondary | ICD-10-CM | POA: Diagnosis not present

## 2019-08-01 DIAGNOSIS — Z8673 Personal history of transient ischemic attack (TIA), and cerebral infarction without residual deficits: Secondary | ICD-10-CM

## 2019-08-01 MED ORDER — ATORVASTATIN CALCIUM 40 MG PO TABS: 40.0000 mg | ORAL_TABLET | Freq: Every day | ORAL | 3 refills | Status: DC

## 2019-08-01 MED ORDER — METOPROLOL TARTRATE 50 MG PO TABS: 50.0000 mg | ORAL_TABLET | Freq: Two times a day (BID) | ORAL | 3 refills | Status: DC

## 2019-08-01 MED ORDER — APIXABAN 5 MG PO TABS: 5.0000 mg | ORAL_TABLET | Freq: Two times a day (BID) | ORAL | 12 refills | Status: DC

## 2019-08-28 ENCOUNTER — Other Ambulatory Visit: Payer: Self-pay | Admitting: Physical Medicine & Rehabilitation

## 2019-09-01 ENCOUNTER — Encounter: Payer: Self-pay | Admitting: Physical Medicine & Rehabilitation

## 2019-09-01 ENCOUNTER — Encounter: Payer: Medicare Other | Attending: Physical Medicine & Rehabilitation | Admitting: Physical Medicine & Rehabilitation

## 2019-09-01 ENCOUNTER — Ambulatory Visit (HOSPITAL_COMMUNITY)
Admission: RE | Admit: 2019-09-01 | Discharge: 2019-09-01 | Disposition: A | Discharge status: Home / Self Care | Payer: Medicare Other | Source: Ambulatory Visit | Attending: Physical Medicine & Rehabilitation | Admitting: Physical Medicine & Rehabilitation

## 2019-09-01 ENCOUNTER — Other Ambulatory Visit: Payer: Self-pay

## 2019-09-01 ENCOUNTER — Ambulatory Visit: Payer: Medicare Other | Admitting: Physical Medicine & Rehabilitation

## 2019-09-01 VITALS — BP 132/79 | HR 78 | Temp 97.5°F | Ht 71.0 in | Wt 171.2 lb

## 2019-09-01 DIAGNOSIS — M47812 Spondylosis without myelopathy or radiculopathy, cervical region: Secondary | ICD-10-CM | POA: Diagnosis not present

## 2019-09-01 DIAGNOSIS — R911 Solitary pulmonary nodule: Secondary | ICD-10-CM | POA: Diagnosis not present

## 2019-09-01 DIAGNOSIS — M792 Neuralgia and neuritis, unspecified: Secondary | ICD-10-CM | POA: Diagnosis not present

## 2019-09-01 DIAGNOSIS — R269 Unspecified abnormalities of gait and mobility: Secondary | ICD-10-CM

## 2019-09-01 DIAGNOSIS — G8929 Other chronic pain: Secondary | ICD-10-CM | POA: Insufficient documentation

## 2019-09-01 DIAGNOSIS — M25512 Pain in left shoulder: Secondary | ICD-10-CM

## 2019-09-01 DIAGNOSIS — M19012 Primary osteoarthritis, left shoulder: Secondary | ICD-10-CM | POA: Diagnosis not present

## 2019-09-01 DIAGNOSIS — I69354 Hemiplegia and hemiparesis following cerebral infarction affecting left non-dominant side: Secondary | ICD-10-CM

## 2019-09-01 DIAGNOSIS — I10 Essential (primary) hypertension: Secondary | ICD-10-CM | POA: Diagnosis not present

## 2019-09-01 DIAGNOSIS — R222 Localized swelling, mass and lump, trunk: Secondary | ICD-10-CM | POA: Diagnosis not present

## 2019-09-01 DIAGNOSIS — M7552 Bursitis of left shoulder: Secondary | ICD-10-CM | POA: Diagnosis not present

## 2019-09-01 DIAGNOSIS — G8110 Spastic hemiplegia affecting unspecified side: Secondary | ICD-10-CM

## 2019-09-01 DIAGNOSIS — E119 Type 2 diabetes mellitus without complications: Secondary | ICD-10-CM | POA: Insufficient documentation

## 2019-09-01 DIAGNOSIS — M542 Cervicalgia: Secondary | ICD-10-CM | POA: Diagnosis not present

## 2019-09-01 MED ORDER — PREGABALIN 200 MG PO CAPS: 200.0000 mg | ORAL_CAPSULE | Freq: Three times a day (TID) | ORAL | 1 refills | Status: DC

## 2019-09-01 NOTE — Progress Notes (Signed)
Subjective:    Patient ID: Marcus Beasley, male    DOB: 05/29/1946, 74 y.o.   MRN: [id number]  HPI:  Right-handed male with history of diabetes mellitus, hypertension presents for follow up for right thalamic hemorrhage, now with spasticity.   Last clinic visit 07/21/19. He had Botulinum injection at that time.  Since that time, pt states pt states no change with injection. He completed therapies.  He does not notice benefit with Lyrica.  Sleep is fair.  Denies falls.  Pain Inventory Average Pain 2 Pain Right Now 2 My pain is constant and tingling  In the last 24 hours, has pain interfered with the following? General activity 1 Relation with others 1 Enjoyment of life 1 What TIME of day is your pain at its worst? daytime Sleep (in general) Fair  Pain is worse with: unsure Pain improves with: rest, therapy/exercise and injections Relief from Meds: 1  Mobility walk with assistance use a cane ability to climb steps?  yes Do you have any goals in this area?  no  Function retired I need assistance with the following:  meal prep, household duties and shopping  Neuro/Psych tingling  Prior Studies Any changes since last visit?  no  Physicians involved in your care Any changes since last visit?  yes Urologist-Dr. Chrystine Oiler   Family History  Problem Relation Age of Onset  . Leukemia Mother   . Heart attack Father        MI age 79  . Heart disease Sister        age 42  . Cancer - Other Sister        type  . Heart disease Brother        age 65  . Kidney disease Brother        HD  . Diabetes Maternal Grandmother   . Prostate cancer Neg Hx   . Colon cancer Neg Hx   . Esophageal cancer Neg Hx   . Rectal cancer Neg Hx   . Stomach cancer Neg Hx    Social History   Socioeconomic History  . Marital status: Married    Spouse name: Not on file  . Number of children: 3  . Years of education: 82  . Highest education level: Not on file  Occupational History   . 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 Smoking status: Never Smoker  . Smokeless tobacco: Never Used  Substance and Sexual Activity  . Alcohol use: Yes    Comment: occ. beer  . Drug use: No  . Sexual activity: Yes    Partners: Female  Other Topics Concern  . 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 2 dtrs , 1 son - homicide. 5 grandchildren.     Household: pt, wife, daughter and son   Does not drive    Social Determinants of Radio broadcast assistant Strain:   . Difficulty of Paying Living Expenses:   Food Insecurity:   . Worried About Charity fundraiser in the Last Year:   . Arboriculturist in the Last Year:   Transportation Needs:   . Film/video editor (Medical):   Marland Kitchen Lack of Transportation (Non-Medical):   Physical Activity:   . Days of Exercise per Week:   . Minutes of Exercise per Session:   Stress:   . Feeling of Stress :  Social Connections:   . Frequency of Communication with Friends and Family:   . Frequency of Social Gatherings with Friends and Family:   . Attends Religious Services:   . Active Member of Clubs or Organizations:   . Attends Archivist Meetings:   Marland Kitchen Marital Status:    Past Surgical History:  Procedure Laterality Date  . ABCESS DRAINAGE     abdomen- 26 day hospitalization 1968  . COLONOSCOPY  2016  . HERNIA REPAIR  summer '11   umbilical, dr Ninfa Linden   . POLYPECTOMY    . PROSTATE BIOPSY  06-2013 , 07-2017   (-), (-)   Past Medical History:  Diagnosis Date  . BPH (benign prostatic hyperplasia)    (-) Bx 2015  . Diabetes mellitus without complication ([place])   . Elevated PSA    Prostate Bx in 06/2013 was benign  . HTN (hypertension)   . Hyperlipidemia   . Macular degeneration, age related    BP 132/79   Pulse 78   Temp (!) 97.5 F (36.4 C)   Ht 5\' 11"  (1.803 m)   Wt 171 lb 3.2 oz (77.7 kg)   SpO2 95%   BMI 23.88  kg/m   Opioid Risk Score:   Fall Risk Score:  `1  Depression screen PHQ 2/9  Depression screen [facility] 2/9 11/11/2018 10/06/2018 04/28/2018 08/25/2017 04/14/2016 12/14/2015 08/06/2015  Decreased Interest 0 0 0 0 0 0 0  Down, Depressed, Hopeless 0 0 0 0 0 0 0  PHQ - 2 Score 0 0 0 0 0 0 0  Some recent data might be hidden     Review of Systems  Constitutional: Negative.        Appetite poor and feels like he is still losing weight. (has not weighed since last visit)  HENT: Negative.   Eyes: Negative.   Respiratory: Negative.   Cardiovascular: Negative.   Gastrointestinal: Negative.   Endocrine: Negative.   Genitourinary: Negative.        Hesitancy  Musculoskeletal: Positive for arthralgias and gait problem.  Skin: Negative.   Allergic/Immunologic: Negative.   Neurological:       Tingling  Hematological: Negative.        On eliquis  Psychiatric/Behavioral: Negative.       Objective:   Physical Exam  Constitutional: No distress .  Psych: Normal mood.  Normal behavior. Musc: No pain with ER/IR Left shoulder Pain with shoulder abduction >90 deg Pronounced left sternoclavicular joint Neuro: Alert Motor:  LUE: Shoulder abduction 4/5, elbow flex 4+/5, elbow extension 4-/5, hand grip 4-/5 with apraxia LLE: HF, KE, ADF 4+/5,improving Mas: left elbow flexors: 1/4, limited due to patient resistance, but appears to have some improvement    Assessment & Plan:  Right-handed male with history of diabetes mellitus, hypertension presents for follow up for right thalamic hemorrhage, now with some spasticity.   1. Left-sided weakness secondary to right thalamic hemorrhage secondary to hypertensive crisis now with spasticity             Completed therapies, cont HEP             Cont follow up with Neurology             No benefit with Botox:                         Left Biceps: 100 units  Left upper traps: 100 units                         Left Med Pec: 100  units  Cervical xray ordered  Shoulder xray ordered  Sternoclavicular xray ordered              2. Pain Management:              ?No benefit with Baclofen, ?taking at presents, pt states daughter manages             Tylenol as needed             Does not want toe take  Elavil 10 qhs             D/ced Tramadol             Cont ROM             Will increase Lyrica to 200 TID, unable to tolerate higher doses of Gabapentin             Will consider PNS  3. Gait abnormality             Completed therapies, cont HEP             Cont cane for safety  4. Post stroke shoulder pain             Subacromial steroid injection under ulatrasound with benefitin pain and ROM

## 2019-09-26 LAB — PSA: PSA: 7.77

## 2019-10-03 ENCOUNTER — Other Ambulatory Visit: Payer: Self-pay | Admitting: Physician Assistant

## 2019-10-03 DIAGNOSIS — R300 Dysuria: Secondary | ICD-10-CM | POA: Diagnosis not present

## 2019-10-03 NOTE — Telephone Encounter (Signed)
Rx request sent to pharmacy.  

## 2019-10-05 ENCOUNTER — Other Ambulatory Visit: Payer: Self-pay

## 2019-10-06 ENCOUNTER — Encounter: Payer: Medicare Other | Attending: Physical Medicine & Rehabilitation | Admitting: Physical Medicine & Rehabilitation

## 2019-10-06 ENCOUNTER — Other Ambulatory Visit: Payer: Self-pay

## 2019-10-06 ENCOUNTER — Encounter: Payer: Self-pay | Admitting: Internal Medicine

## 2019-10-06 ENCOUNTER — Encounter: Payer: Self-pay | Admitting: Physical Medicine & Rehabilitation

## 2019-10-06 ENCOUNTER — Ambulatory Visit (INDEPENDENT_AMBULATORY_CARE_PROVIDER_SITE_OTHER): Payer: Medicare Other | Admitting: Internal Medicine

## 2019-10-06 VITALS — BP 118/74 | HR 69 | Temp 98.7°F | Ht 71.0 in | Wt 174.2 lb

## 2019-10-06 VITALS — BP 125/84 | HR 71 | Temp 97.6°F | Resp 18 | Ht 71.0 in | Wt 173.2 lb

## 2019-10-06 DIAGNOSIS — I480 Paroxysmal atrial fibrillation: Secondary | ICD-10-CM

## 2019-10-06 DIAGNOSIS — I10 Essential (primary) hypertension: Secondary | ICD-10-CM | POA: Diagnosis not present

## 2019-10-06 DIAGNOSIS — E119 Type 2 diabetes mellitus without complications: Secondary | ICD-10-CM

## 2019-10-06 DIAGNOSIS — M19012 Primary osteoarthritis, left shoulder: Secondary | ICD-10-CM

## 2019-10-06 DIAGNOSIS — G8110 Spastic hemiplegia affecting unspecified side: Secondary | ICD-10-CM | POA: Insufficient documentation

## 2019-10-06 DIAGNOSIS — G8929 Other chronic pain: Secondary | ICD-10-CM | POA: Insufficient documentation

## 2019-10-06 DIAGNOSIS — M25512 Pain in left shoulder: Secondary | ICD-10-CM | POA: Insufficient documentation

## 2019-10-06 DIAGNOSIS — M792 Neuralgia and neuritis, unspecified: Secondary | ICD-10-CM | POA: Diagnosis not present

## 2019-10-06 DIAGNOSIS — M791 Myalgia, unspecified site: Secondary | ICD-10-CM | POA: Diagnosis not present

## 2019-10-06 DIAGNOSIS — I69354 Hemiplegia and hemiparesis following cerebral infarction affecting left non-dominant side: Secondary | ICD-10-CM

## 2019-10-06 LAB — BASIC METABOLIC PANEL
BUN: 15 mg/dL (ref 6–23)
CO2: 27 mEq/L (ref 19–32)
Calcium: 9.1 mg/dL (ref 8.4–10.5)
Chloride: 108 mEq/L (ref 96–112)
Creatinine, Ser: 0.91 mg/dL (ref 0.40–1.50)
GFR: 98.24 mL/min (ref >60.00)
Glucose, Bld: 85 mg/dL (ref 70–99)
Potassium: 4.3 mEq/L (ref 3.5–5.1)
Sodium: 141 mEq/L (ref 135–145)

## 2019-10-06 LAB — HEMOGLOBIN A1C: Hgb A1c MFr Bld: 5.6 % (ref 4.6–6.5)

## 2019-10-06 MED ORDER — AMITRIPTYLINE HCL 10 MG PO TABS: 10.0000 mg | ORAL_TABLET | Freq: Every day | ORAL | 1 refills | Status: DC

## 2019-10-06 MED ORDER — TIZANIDINE HCL 2 MG PO TABS: 2.0000 mg | ORAL_TABLET | Freq: Two times a day (BID) | ORAL | 1 refills | Status: DC | PRN

## 2019-10-06 NOTE — Progress Notes (Signed)
Pre visit review using our clinic review tool, if applicable. No additional management support is needed unless otherwise documented below in the visit note. 

## 2019-10-06 NOTE — Progress Notes (Signed)
Subjective:    Patient ID: Marcus Beasley, male    DOB: 1946/01/07, 74 y.o.   MRN: [id number]  DOS:  10/06/2019 Type of visit - description: Routine checkup Since the last office visit he is doing well. Good med compliance according to the patient He is concerned about his left clavicle being more prominent, see physical exam. Notes from neurology and physical medicine reviewed.  Wt Readings from Last 3 Encounters:  10/06/19 173 lb 4 oz (78.6 kg)  09/01/19 171 lb 3.2 oz (77.7 kg)  08/01/19 171 lb (77.6 kg)     Review of Systems Denies any chest pain difficulty breathing. No anxiety or depression   Past Medical History:  Diagnosis Date  . BPH (benign prostatic hyperplasia)    (-) Bx 2015  . Diabetes mellitus without complication ([place])   . Elevated PSA    Prostate Bx in 06/2013 was benign  . HTN (hypertension)   . Hyperlipidemia   . Macular degeneration, age related     Past Surgical History:  Procedure Laterality Date  . ABCESS DRAINAGE     abdomen- 26 day hospitalization 1968  . COLONOSCOPY  2016  . HERNIA REPAIR  summer '11   umbilical, dr Ninfa Linden   . POLYPECTOMY    . PROSTATE BIOPSY  06-2013 , 07-2017   (-), (-)    Allergies as of 10/06/2019   No Known Allergies     Medication List       Accurate as of October 06, 2019 10:06 AM. If you have any questions, ask your nurse or doctor.        amLODipine 5 MG tablet Commonly known as: NORVASC TAKE 1 TABLET BY MOUTH EVERY DAY   apixaban 5 MG Tabs tablet Commonly known as: Eliquis Take 1 tablet (5 mg total) by mouth 2 (two) times daily.   atorvastatin 40 MG tablet Commonly known as: LIPITOR Take 1 tablet (40 mg total) by mouth daily. Please make annual appt with Dr. Martinique for refills. [phone]. 1st attempt.   diltiazem 240 MG 24 hr capsule Commonly known as: CARDIZEM CD TAKE 1 CAPSULE BY MOUTH EVERY DAY   glucose blood test strip Commonly known as: Electronics engineer BLOOD SUGAR NO MORE THAN  TWICE DAILY   meclizine 25 MG tablet Commonly known as: ANTIVERT Take 1 tablet (25 mg total) by mouth 2 (two) times daily as needed for dizziness.   metFORMIN 850 MG tablet Commonly known as: GLUCOPHAGE TAKE 1 TABLET (850 MG TOTAL) BY MOUTH 2 (TWO) TIMES DAILY WITH A MEAL.   metoprolol tartrate 50 MG tablet Commonly known as: LOPRESSOR Take 1 tablet (50 mg total) by mouth 2 (two) times daily.   multivitamin with minerals Tabs tablet Take 1 tablet by mouth daily.   pregabalin 200 MG capsule Commonly known as: Lyrica Take 1 capsule (200 mg total) by mouth in the morning, at noon, and at bedtime.   PRESERVISION/LUTEIN PO Take 1 tablet by mouth daily.   tamsulosin 0.4 MG Caps capsule Commonly known as: FLOMAX Take 0.4 mg by mouth daily.          Objective:   Physical Exam Chest:      BP 125/84 (BP Location: Right Arm, Patient Position: Sitting, Cuff Size: Normal)   Pulse 71   Temp 97.6 F (36.4 C) (Temporal)   Resp 18   Ht 5\' 11"  (1.803 m)   Wt 173 lb 4 oz (78.6 kg)   SpO2 98%   BMI 24.16  kg/m  General:   Well developed, NAD, BMI noted. HEENT:  Normocephalic . Face symmetric, atraumatic Lungs:  CTA B Normal respiratory effort, no intercostal retractions, no accessory muscle use. Heart: RRR,  no murmur.  Lower extremities: no pretibial edema bilaterally  Skin: Not pale. Not jaundice Neurologic:  alert & oriented X3.  Speech normal, gait assisted by a cane. Mild left-sided weakness most noticeable at the upper extremity Psych--  Cognition and judgment appear intact.  Cooperative with normal attention span and concentration.  Behavior appropriate. No anxious or depressed appearing.      Assessment      Assessment DM  ---->  DX 07-2015, A1c 8.0; started metformin Neuropathy, mild.  DX 08-2017 (in sensitive type) HTN 24 9 Hyperlipidemia UROLOGY: BPH, increased PSA.BX (-) -2015, (-) 07-2017 per pt  Hemorrhagic thalamic stroke 02-2018 d/t HTN, left-sided  weakness Paroxysmal atrial fibrillation found 02/2018 Increase LFTs: See OV 07-2018.  Hep C serologies negative, hep B surface Ab+, hep B core antibody negative (consistent with vaccination)  PLAN DM: Continue Metformin, check a BMP and A1c HTN: Seems well controlled on amlodipine, Cardizem, metoprolol. A. fib: Cardiology visit 08/01/2019, note reviewed, felt to be stable, continue Eliquis h/o stroke: Physical medicine visit 09/01/2019: they noted no benefit from Botox, question of improvement with baclofen, patient declined Elavil and tramadol was discontinued. Lyrica increased to 200 mg 3 times daily. He also received a steroid injection at the shoulder Asymmetric clavicles: See physical exam, recommend observation for now Preventive care: Reports he had 2 Covid vaccinations RTC 4 to 5 months    This visit occurred during the SARS-CoV-2 public health emergency.  Safety protocols were in place, including screening questions prior to the visit, additional usage of staff PPE, and extensive cleaning of exam room while observing appropriate contact time as indicated for disinfecting solutions.

## 2019-10-06 NOTE — Progress Notes (Addendum)
Subjective:    Patient ID: Marcus Beasley, male    DOB: [birth year], 74 y.o.   MRN: [id number]  HPI:  Right-handed male with history of diabetes mellitus, hypertension presents for follow up for right thalamic hemorrhage, now with spasticity.   Last clinic visit 09/01/19.  Since that time, pt states he is doing HEP. He obtained xrays of his neck, shoulder, and sternum. No benefit with Lyrica.  He is now willing to trial Elavil. Denies falls.  He continues to note tingling in his left hand and stiffness in his right shoulders and arm.   Pain Inventory Average Pain 5 Pain Right Now 5 My pain is constant, tingling and aching  In the last 24 hours, has pain interfered with the following? General activity 4 Relation with others 2 Enjoyment of life 2 What TIME of day is your pain at its worst? night Sleep (in general) Fair  Pain is worse with: some activites Pain improves with: rest, therapy/exercise and injections Relief from Meds: 3  Mobility walk with assistance use a cane ability to climb steps?  yes do you drive?  no  Function retired I need assistance with the following:  meal prep and shopping  Neuro/Psych numbness  Prior Studies Any changes since last visit?  no  Physicians involved in your care Any changes since last visit?  no   Family History  Problem Relation Age of Onset  . Leukemia Mother   . Heart attack Father        MI age 89  . Heart disease Sister        age 78  . Cancer - Other Sister        type  . Heart disease Brother        age 12  . Kidney disease Brother        HD  . Diabetes Maternal Grandmother   . Prostate cancer Neg Hx   . Colon cancer Neg Hx   . Esophageal cancer Neg Hx   . Rectal cancer Neg Hx   . Stomach cancer Neg Hx    Social History   Socioeconomic History  . Marital status: Married    Spouse name: Not on file  . Number of children: 3  . Years of education: 46  . Highest education level: Not on file  Occupational  History  . 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 Smoking status: Never Smoker  . Smokeless tobacco: Never Used  Substance and Sexual Activity  . Alcohol use: Yes    Comment: occ. beer  . Drug use: No  . Sexual activity: Yes    Partners: Female  Other Topics Concern  . 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 2 dtrs , 1 son - homicide. 5 grandchildren.     Household: pt, wife, daughter and son   Does not drive    Social Determinants of Radio broadcast assistant Strain:   . Difficulty of Paying Living Expenses:   Food Insecurity:   . Worried About Charity fundraiser in the Last Year:   . Arboriculturist in the Last Year:   Transportation Needs:   . Film/video editor (Medical):   Marland Kitchen Lack of Transportation (Non-Medical):   Physical Activity:   . Days of Exercise per Week:   . Minutes of Exercise per Session:   Stress:   .  Feeling of Stress :   Social Connections:   . Frequency of Communication with Friends and Family:   . Frequency of Social Gatherings with Friends and Family:   . Attends Religious Services:   . Active Member of Clubs or Organizations:   . Attends Archivist Meetings:   Marland Kitchen Marital Status:    Past Surgical History:  Procedure Laterality Date  . ABCESS DRAINAGE     abdomen- 26 day hospitalization 1968  . COLONOSCOPY  2016  . HERNIA REPAIR  summer '11   umbilical, dr Ninfa Linden   . POLYPECTOMY    . PROSTATE BIOPSY  06-2013 , 07-2017   (-), (-)   Past Medical History:  Diagnosis Date  . BPH (benign prostatic hyperplasia)    (-) Bx 2015  . Diabetes mellitus without complication ([place])   . Elevated PSA    Prostate Bx in 06/2013 was benign  . HTN (hypertension)   . Hyperlipidemia   . Macular degeneration, age related    BP 118/74   Pulse 69   Temp 98.7 F (37.1 C)   Ht 5\' 11"  (1.803 m)   Wt 174 lb 3.2 oz (79 kg)   SpO2 97%   BMI 24.30  kg/m   Opioid Risk Score:   Fall Risk Score:  `1  Depression screen PHQ 2/9  Depression screen [facility] 2/9 11/11/2018 10/06/2018 04/28/2018 08/25/2017 04/14/2016 12/14/2015 08/06/2015  Decreased Interest 0 0 0 0 0 0 0  Down, Depressed, Hopeless 0 0 0 0 0 0 0  PHQ - 2 Score 0 0 0 0 0 0 0  Some recent data might be hidden     Review of Systems  Constitutional: Negative.        Appetite poor and feels like he is still losing weight. (has not weighed since last visit)  HENT: Negative.   Eyes: Negative.   Respiratory: Negative.   Cardiovascular: Negative.   Gastrointestinal: Negative.   Endocrine: Negative.   Genitourinary: Negative.        Hesitancy  Musculoskeletal: Positive for arthralgias and gait problem.  Skin: Negative.   Allergic/Immunologic: Negative.   Neurological:       Tingling  Hematological: Negative.        On eliquis  Psychiatric/Behavioral: Negative.       Objective:   Physical Exam  Constitutional: NAD.  Psych: Normal mood.  Normal behavior. Musc: No pain with ER/IR Left shoulder Pain with shoulder abduction >90 deg Pronounced left sternoclavicular joint, unchanged Neuro: Alert Motor:  LUE: Shoulder abduction 4/5, elbow flex 4+/5, elbow extension 4-/5, hand grip 4/5 with apraxia LLE: HF, KE, ADF 4+/5,improving Mas: left elbow flexors: 1/4, limited due to patient resistance, improved    Assessment & Plan:  Right-handed male with history of diabetes mellitus, hypertension presents for follow up for right thalamic hemorrhage, now with some spasticity.   1. Left-sided weakness secondary to right thalamic hemorrhage secondary to hypertensive crisis now with spasticity             Completed therapies, continue HEP             Cont follow up with Neurology             No benefit with Botox:                         Left Biceps: 100 units  Left upper traps: 100 units                         Left Med Pec: 100 units  Cervical xray personally  reviewed, showing muscle spasms and degenerative changes >C5-6 - will consider MRI based on results of injections  Shoulder xray reviewed showing shoulder OA  Sternoclavicular xray reviewed, unremarkable              2. Pain Management:              ?No benefit with Baclofen, ?taking at presents, pt states daughter manages  No benefit with Lyrica, will wean             Tylenol as needed             Will order Elavil 10 qhs             D/ced Tramadol             Cont ROM  Will order Tizanidine 2mg  BID PRN (insurance does not cover Robaxin)             Will consider PNS  3. Gait abnormality             Completed therapies, cont HEP             Cont cane for safety  4. Post stroke shoulder pain             Subacromial steroid injection under ulatrasound with benefitin pain and ROM  Will schedule for intraarticular steroid injection  5. Myalgia  Will plan for trigger point injections in future

## 2019-10-06 NOTE — Patient Instructions (Addendum)
Please schedule Medicare Wellness with Glenard Haring.   Please check your blood pressure weekly BP GOAL is between 110/65 and  135/85. If it is consistently higher or lower, let me know   GO TO THE LAB : Get the blood work     [place], [place] back for for a checkup in 4 to 5 months

## 2019-10-07 NOTE — Assessment & Plan Note (Signed)
DM: Continue Metformin, check a BMP and A1c HTN: Seems well controlled on amlodipine, Cardizem, metoprolol. A. fib: Cardiology visit 08/01/2019, note reviewed, felt to be stable, continue Eliquis h/o stroke: Physical medicine visit 09/01/2019: they noted no benefit from Botox, question of improvement with baclofen, patient declined Elavil and tramadol was discontinued. Lyrica increased to 200 mg 3 times daily. He also received a steroid injection at the shoulder Asymmetric clavicles: See physical exam, recommend observation for now Preventive care: Reports he had 2 Covid vaccinations RTC 4 to 5 months

## 2019-10-19 ENCOUNTER — Encounter: Payer: Self-pay | Admitting: Internal Medicine

## 2019-10-27 ENCOUNTER — Encounter: Payer: Self-pay | Admitting: Physical Medicine & Rehabilitation

## 2019-10-27 ENCOUNTER — Other Ambulatory Visit: Payer: Self-pay

## 2019-10-27 ENCOUNTER — Encounter: Payer: Medicare Other | Attending: Physical Medicine & Rehabilitation | Admitting: Physical Medicine & Rehabilitation

## 2019-10-27 VITALS — BP 126/84 | HR 74 | Temp 97.5°F | Ht 71.0 in | Wt 172.0 lb

## 2019-10-27 DIAGNOSIS — M19012 Primary osteoarthritis, left shoulder: Secondary | ICD-10-CM | POA: Diagnosis not present

## 2019-10-27 DIAGNOSIS — M25512 Pain in left shoulder: Secondary | ICD-10-CM | POA: Insufficient documentation

## 2019-10-27 DIAGNOSIS — G8110 Spastic hemiplegia affecting unspecified side: Secondary | ICD-10-CM | POA: Diagnosis not present

## 2019-10-27 DIAGNOSIS — G8929 Other chronic pain: Secondary | ICD-10-CM | POA: Diagnosis not present

## 2019-10-27 DIAGNOSIS — E119 Type 2 diabetes mellitus without complications: Secondary | ICD-10-CM | POA: Insufficient documentation

## 2019-10-27 DIAGNOSIS — I10 Essential (primary) hypertension: Secondary | ICD-10-CM | POA: Insufficient documentation

## 2019-10-27 NOTE — Progress Notes (Signed)
Ultrasound guided intraarticular shoulder injection: Left  Indication:Shoulder pain not relieved by medication management and other conservative care.  Informed consent was obtained after describing risks and benefits of the procedure with the patient, this includes bleeding, bruising, infection and medication side effects. The patient wishes to proceed and has given written consent. Patient was placed in a seated position with the ipsilateral hand placed on the contralateral shoulder. The left shoulder was marked and prepped with betadine in the lateral subacromial area. Using ultrasound tranducer was placed inferior and parallel to the scapular spine.  The glenohumeral joint was visualized.  Vapocoolant spray was applied.  A 22-gauge 2 inch needle was inserted into the subacromial area under with visualization until the needle was in the joint space. After negative draw back for blood, a solution containing 1 mL of 6 mg per ML betamethasone and 4 mL of 1% lidocaine was injected. A band aid was applied. The patient tolerated the procedure well. Post procedure instructions were given.

## 2019-10-29 ENCOUNTER — Other Ambulatory Visit: Payer: Self-pay | Admitting: Physical Medicine & Rehabilitation

## 2019-11-10 ENCOUNTER — Other Ambulatory Visit: Payer: Self-pay | Admitting: Physical Medicine & Rehabilitation

## 2019-11-10 ENCOUNTER — Encounter: Payer: Medicare Other | Admitting: Physical Medicine & Rehabilitation

## 2019-11-17 ENCOUNTER — Other Ambulatory Visit: Payer: Self-pay | Admitting: Physical Medicine & Rehabilitation

## 2019-11-17 ENCOUNTER — Encounter: Payer: Medicare Other | Admitting: Physical Medicine & Rehabilitation

## 2019-11-17 ENCOUNTER — Other Ambulatory Visit: Payer: Self-pay

## 2019-11-17 ENCOUNTER — Encounter: Payer: Self-pay | Admitting: Physical Medicine & Rehabilitation

## 2019-11-17 VITALS — BP 133/87 | HR 76 | Temp 97.5°F | Ht 71.0 in | Wt 173.5 lb

## 2019-11-17 DIAGNOSIS — G8929 Other chronic pain: Secondary | ICD-10-CM | POA: Diagnosis not present

## 2019-11-17 DIAGNOSIS — M19012 Primary osteoarthritis, left shoulder: Secondary | ICD-10-CM | POA: Diagnosis not present

## 2019-11-17 DIAGNOSIS — R269 Unspecified abnormalities of gait and mobility: Secondary | ICD-10-CM | POA: Diagnosis not present

## 2019-11-17 DIAGNOSIS — G8110 Spastic hemiplegia affecting unspecified side: Secondary | ICD-10-CM

## 2019-11-17 DIAGNOSIS — I10 Essential (primary) hypertension: Secondary | ICD-10-CM | POA: Diagnosis not present

## 2019-11-17 DIAGNOSIS — M792 Neuralgia and neuritis, unspecified: Secondary | ICD-10-CM

## 2019-11-17 DIAGNOSIS — M791 Myalgia, unspecified site: Secondary | ICD-10-CM | POA: Diagnosis not present

## 2019-11-17 DIAGNOSIS — M25512 Pain in left shoulder: Secondary | ICD-10-CM | POA: Diagnosis not present

## 2019-11-17 DIAGNOSIS — E119 Type 2 diabetes mellitus without complications: Secondary | ICD-10-CM | POA: Diagnosis not present

## 2019-11-17 MED ORDER — AMITRIPTYLINE HCL 25 MG PO TABS: 25.0000 mg | ORAL_TABLET | Freq: Every day | ORAL | 1 refills | Status: DC

## 2019-11-17 MED ORDER — TIZANIDINE HCL 4 MG PO TABS: 4.0000 mg | ORAL_TABLET | Freq: Two times a day (BID) | ORAL | 0 refills | Status: DC | PRN

## 2019-11-17 NOTE — Progress Notes (Signed)
Subjective:    Patient ID: Marcus Beasley, male    DOB: [birth year], 74 y.o.   MRN: [id number]  HPI:  Right-handed male with history of diabetes mellitus, hypertension presents for follow up for right thalamic hemorrhage, now with spasticity.   Last clinic visit 10/06/19.  Since that time, he states he does not notice difference with Elavil.  He notes his tightness has decreased somewhat. No benefit with Tizanidine.  Denies falls.   Pain Inventory Average Pain 6 Pain Right Now 6 My pain is constant, tingling and aching  In the last 24 hours, has pain interfered with the following? General activity 4 Relation with others 4 Enjoyment of life 4 What TIME of day is your pain at its worst? morning and night Sleep (in general) Fair  Pain is worse with: some activites Pain improves with: rest, therapy/exercise and injections Relief from Meds: 4  Mobility walk with assistance use a cane ability to climb steps?  yes do you drive?  no  Function retired I need assistance with the following:  meal prep and shopping  Neuro/Psych numbness tingling  Prior Studies Any changes since last visit?  no  Physicians involved in your care Any changes since last visit?  no   Family History  Problem Relation Age of Onset  . Leukemia Mother   . Heart attack Father        MI age 28  . Heart disease Sister        age 27  . Cancer - Other Sister        type  . Heart disease Brother        age 47  . Kidney disease Brother        HD  . Diabetes Maternal Grandmother   . Prostate cancer Neg Hx   . Colon cancer Neg Hx   . Esophageal cancer Neg Hx   . Rectal cancer Neg Hx   . Stomach cancer Neg Hx    Social History   Socioeconomic History  . Marital status: Married    Spouse name: Not on file  . Number of children: 3  . Years of education: 19  . Highest education level: Not on file  Occupational History  . 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 Smoking status: Never Smoker  . Smokeless tobacco: Never Used  Substance and Sexual Activity  . Alcohol use: Yes    Comment: occ. beer  . Drug use: No  . Sexual activity: Yes    Partners: Female  Other Topics Concern  . 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 2 dtrs , 1 son - homicide. 5 grandchildren.     Household: pt, wife, daughter and son   Does not drive    Social Determinants of Radio broadcast assistant Strain:   . Difficulty of Paying Living Expenses:   Food Insecurity:   . Worried About Charity fundraiser in the Last Year:   . Arboriculturist in the Last Year:   Transportation Needs:   . Film/video editor (Medical):   Marland Kitchen Lack of Transportation (Non-Medical):   Physical Activity:   . Days of Exercise per Week:   . Minutes of Exercise per Session:   Stress:   . Feeling of Stress :   Social Connections:   . Frequency of Communication with Friends and Family:   .  Frequency of Social Gatherings with Friends and Family:   . Attends Religious Services:   . Active Member of Clubs or Organizations:   . Attends Archivist Meetings:   Marland Kitchen Marital Status:    Past Surgical History:  Procedure Laterality Date  . ABCESS DRAINAGE     abdomen- 26 day hospitalization 1968  . COLONOSCOPY  2016  . HERNIA REPAIR  summer '11   umbilical, dr Ninfa Linden   . POLYPECTOMY    . PROSTATE BIOPSY  06-2013 , 07-2017   (-), (-)   Past Medical History:  Diagnosis Date  . BPH (benign prostatic hyperplasia)    (-) Bx 2015  . Diabetes mellitus without complication ([place])   . Elevated PSA    Prostate Bx in 06/2013 was benign  . HTN (hypertension)   . Hyperlipidemia   . Macular degeneration, age related    BP 133/87   Pulse 76   Temp (!) 97.5 F (36.4 C)   Ht 5\' 11"  (1.803 m)   Wt 173 lb 8 oz (78.7 kg)   SpO2 98%   BMI 24.20 kg/m   Opioid Risk Score:   Fall Risk Score:  `1  Depression screen PHQ  2/9  Depression screen [facility] 2/9 11/11/2018 10/06/2018 04/28/2018 08/25/2017 04/14/2016 12/14/2015 08/06/2015  Decreased Interest 0 0 0 0 0 0 0  Down, Depressed, Hopeless 0 0 0 0 0 0 0  PHQ - 2 Score 0 0 0 0 0 0 0  Some recent data might be hidden     Review of Systems  Constitutional: Negative.        Appetite poor and feels like he is still losing weight. (has not weighed since last visit)  HENT: Negative.   Eyes: Negative.   Respiratory: Negative.   Cardiovascular: Negative.   Gastrointestinal: Negative.   Endocrine: Negative.   Genitourinary: Negative.        Hesitancy  Musculoskeletal: Positive for arthralgias and gait problem.  Skin: Negative.   Allergic/Immunologic: Negative.   Neurological: Positive for weakness and numbness.       Tingling  Hematological: Negative.        On eliquis  Psychiatric/Behavioral: Negative.       Objective:   Physical Exam  Constitutional: NAD.  Psych: Normal mood.  Normal behavior. Musc: No pain with ER/IR Left shoulder No pain with shoulder abduction >90 deg Neuro: Alert Motor:  LUE: Shoulder abduction 4/5, elbow flex 4+/5, elbow extension 4-/5, hand grip 4/5 with apraxia, stable LLE: HF, KE, ADF 4+/5,improving Mas: left elbow flexors: 1/4, limited due to patient resistance, improved    Assessment & Plan:  Right-handed male with history of diabetes mellitus, hypertension presents for follow up for right thalamic hemorrhage, now with some spasticity.   1. Left-sided weakness secondary to right thalamic hemorrhage secondary to hypertensive crisis now with spasticity Completed therapies, continue HEP Cont follow up with Neurology  ?benefit with Botox: Left Biceps: 100units Left upper traps: 100units Left Med Pec: 100units             Cervical xray personally reviewed, showing muscle spasms and degenerative changes >C5-6 - will consider MRI  based after med adjustments/trigger points             Shoulder xray reviewed showing shoulder OA             Sternoclavicular xray reviewed, unremarkable  2. Pain Management:  ?No benefit with Baclofen, ?taking at presents, pt states daughter manages  No benefit with Lyrica, d/ced Tylenol as needed Will increase Elavil to 25qhs D/ced Tramadol Cont ROM             Will increase Tizanidine 4mg  BID PRN (insurance does not cover Robaxin) Will consider PNS  3. Gait abnormality Completed therapies, cont HEP Cont cane for safety  4. Post stroke shoulder pain Subacromial steroid injection under ulatrasoundwith benefit in pain and ROM             Intraarticular steroid injection with benefit in ROM and "stiffness"  5. Myalgia             Will schedule for trigger point injections  See #2

## 2019-11-24 ENCOUNTER — Other Ambulatory Visit: Payer: Self-pay | Admitting: Physical Medicine & Rehabilitation

## 2019-11-25 ENCOUNTER — Other Ambulatory Visit: Payer: Self-pay | Admitting: Physical Medicine & Rehabilitation

## 2019-11-29 ENCOUNTER — Encounter: Payer: Self-pay | Admitting: Physical Medicine & Rehabilitation

## 2019-11-29 ENCOUNTER — Other Ambulatory Visit: Payer: Self-pay

## 2019-11-29 ENCOUNTER — Encounter: Payer: Medicare Other | Attending: Physical Medicine & Rehabilitation | Admitting: Physical Medicine & Rehabilitation

## 2019-11-29 VITALS — BP 130/88 | HR 73 | Temp 97.3°F | Ht 71.0 in | Wt 169.0 lb

## 2019-11-29 DIAGNOSIS — G8110 Spastic hemiplegia affecting unspecified side: Secondary | ICD-10-CM | POA: Insufficient documentation

## 2019-11-29 DIAGNOSIS — I10 Essential (primary) hypertension: Secondary | ICD-10-CM | POA: Insufficient documentation

## 2019-11-29 DIAGNOSIS — G8929 Other chronic pain: Secondary | ICD-10-CM | POA: Diagnosis not present

## 2019-11-29 DIAGNOSIS — M25512 Pain in left shoulder: Secondary | ICD-10-CM | POA: Diagnosis not present

## 2019-11-29 DIAGNOSIS — M791 Myalgia, unspecified site: Secondary | ICD-10-CM | POA: Diagnosis not present

## 2019-11-29 DIAGNOSIS — M792 Neuralgia and neuritis, unspecified: Secondary | ICD-10-CM | POA: Insufficient documentation

## 2019-11-29 DIAGNOSIS — I69354 Hemiplegia and hemiparesis following cerebral infarction affecting left non-dominant side: Secondary | ICD-10-CM | POA: Insufficient documentation

## 2019-11-29 DIAGNOSIS — E119 Type 2 diabetes mellitus without complications: Secondary | ICD-10-CM | POA: Diagnosis not present

## 2019-11-29 NOTE — Progress Notes (Addendum)
Trigger point injection procedure note: Trigger Point Injection: Written consent was obtained for the patient. Trigger points were identified of Left trapezii, cervical PSPs, and peri-scapular muscles (x5). The areas were cleaned with alcohol, vapocoolant spray applied, needle drawback performed, and each of  these trigger points were injected with 1 cc of 0.5% Marcaine. There were no complications from the procedure, and it was well tolerated.

## 2019-12-05 ENCOUNTER — Other Ambulatory Visit: Payer: Self-pay | Admitting: Physical Medicine & Rehabilitation

## 2019-12-06 ENCOUNTER — Encounter: Payer: Self-pay | Admitting: Physical Medicine & Rehabilitation

## 2019-12-06 ENCOUNTER — Encounter (HOSPITAL_BASED_OUTPATIENT_CLINIC_OR_DEPARTMENT_OTHER): Payer: Medicare Other | Admitting: Physical Medicine & Rehabilitation

## 2019-12-06 ENCOUNTER — Other Ambulatory Visit: Payer: Self-pay

## 2019-12-06 VITALS — BP 110/77 | HR 80 | Temp 98.2°F | Ht 71.0 in | Wt 165.6 lb

## 2019-12-06 DIAGNOSIS — M791 Myalgia, unspecified site: Secondary | ICD-10-CM

## 2019-12-06 DIAGNOSIS — E119 Type 2 diabetes mellitus without complications: Secondary | ICD-10-CM | POA: Diagnosis not present

## 2019-12-06 DIAGNOSIS — M792 Neuralgia and neuritis, unspecified: Secondary | ICD-10-CM | POA: Diagnosis not present

## 2019-12-06 DIAGNOSIS — M25512 Pain in left shoulder: Secondary | ICD-10-CM | POA: Diagnosis not present

## 2019-12-06 DIAGNOSIS — I10 Essential (primary) hypertension: Secondary | ICD-10-CM | POA: Diagnosis not present

## 2019-12-06 DIAGNOSIS — I69354 Hemiplegia and hemiparesis following cerebral infarction affecting left non-dominant side: Secondary | ICD-10-CM | POA: Diagnosis not present

## 2019-12-06 DIAGNOSIS — G8929 Other chronic pain: Secondary | ICD-10-CM | POA: Diagnosis not present

## 2019-12-06 DIAGNOSIS — G8110 Spastic hemiplegia affecting unspecified side: Secondary | ICD-10-CM | POA: Diagnosis not present

## 2019-12-06 NOTE — Progress Notes (Signed)
Trigger point injection procedure note: Trigger Point Injection: Written consent was obtained for the patient. Trigger points were identified of Left trapezii, cervical PSPs, and peri-scapular muscles (x4). The areas were cleaned with alcohol, vapocoolant spray applied, needle drawback performed, and each of  these trigger points were injected with 1 cc of 0.5% Marcaine. There were no complications from the procedure, and it was well tolerated.

## 2019-12-07 ENCOUNTER — Other Ambulatory Visit: Payer: Self-pay | Admitting: Internal Medicine

## 2019-12-09 ENCOUNTER — Other Ambulatory Visit: Payer: Self-pay | Admitting: Physical Medicine & Rehabilitation

## 2019-12-19 ENCOUNTER — Encounter: Payer: Medicare Other | Admitting: Physical Medicine & Rehabilitation

## 2019-12-19 ENCOUNTER — Other Ambulatory Visit: Payer: Self-pay

## 2019-12-19 ENCOUNTER — Encounter: Payer: Self-pay | Admitting: Physical Medicine & Rehabilitation

## 2019-12-19 VITALS — BP 117/78 | HR 76 | Temp 98.5°F | Ht 71.0 in | Wt 167.0 lb

## 2019-12-19 DIAGNOSIS — G8929 Other chronic pain: Secondary | ICD-10-CM

## 2019-12-19 DIAGNOSIS — I69354 Hemiplegia and hemiparesis following cerebral infarction affecting left non-dominant side: Secondary | ICD-10-CM

## 2019-12-19 DIAGNOSIS — I10 Essential (primary) hypertension: Secondary | ICD-10-CM | POA: Diagnosis not present

## 2019-12-19 DIAGNOSIS — M25512 Pain in left shoulder: Secondary | ICD-10-CM

## 2019-12-19 DIAGNOSIS — M792 Neuralgia and neuritis, unspecified: Secondary | ICD-10-CM | POA: Diagnosis not present

## 2019-12-19 DIAGNOSIS — E119 Type 2 diabetes mellitus without complications: Secondary | ICD-10-CM | POA: Diagnosis not present

## 2019-12-19 DIAGNOSIS — R269 Unspecified abnormalities of gait and mobility: Secondary | ICD-10-CM

## 2019-12-19 DIAGNOSIS — G8110 Spastic hemiplegia affecting unspecified side: Secondary | ICD-10-CM | POA: Diagnosis not present

## 2019-12-19 NOTE — Progress Notes (Addendum)
Subjective:    Patient ID: Marcus Beasley, male    DOB: 1945/08/08, 74 y.o.   MRN: [id number]  HPI:  Right-handed male with history of diabetes mellitus, hypertension presents for follow up for right thalamic hemorrhage, now with spasticity.   Last clinic visit 11/17/19.  He had trigger point injections at that time. Since that time, pt states he has minimal relief from injections.  He continues HEP. No benefit with increase in Elavil.  No benefit with increase in Tizanidine. Denies falls. Continues to have tingling in LUE.  Pain Inventory Average Pain 6 Pain Right Now 3 My pain is constant, tingling and aching  In the last 24 hours, has pain interfered with the following? General activity 3 Relation with others 3 Enjoyment of life 3 What TIME of day is your pain at its worst? morning and night Sleep (in general) Fair  Pain is worse with: some activites Pain improves with: rest, therapy/exercise and injections Relief from Meds: 4  Mobility walk with assistance use a cane ability to climb steps?  yes do you drive?  no  Function retired I need assistance with the following:  meal prep and shopping  Neuro/Psych numbness tingling  Prior Studies Any changes since last visit?  no  Physicians involved in your care Any changes since last visit?  no   Family History  Problem Relation Age of Onset  . Leukemia Mother   . Heart attack Father        MI age 35  . Heart disease Sister        age 12  . Cancer - Other Sister        type  . Heart disease Brother        age 39  . Kidney disease Brother        HD  . Diabetes Maternal Grandmother   . Prostate cancer Neg Hx   . Colon cancer Neg Hx   . Esophageal cancer Neg Hx   . Rectal cancer Neg Hx   . Stomach cancer Neg Hx    Social History   Socioeconomic History  . Marital status: Married    Spouse name: Not on file  . Number of children: 3  . Years of education: 28  . Highest education level: Not on file    Occupational History  . 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 Smoking status: Never Smoker  . Smokeless tobacco: Never Used  Vaping Use  . Vaping Use: Never used  Substance and Sexual Activity  . Alcohol use: Yes    Comment: occ. beer  . Drug use: No  . Sexual activity: Yes    Partners: Female  Other Topics Concern  . 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 2 dtrs , 1 son - homicide. 5 grandchildren.     Household: pt, wife, daughter and son   Does not drive    Social Determinants of Radio broadcast assistant Strain:   . Difficulty of Paying Living Expenses:   Food Insecurity:   . Worried About Charity fundraiser in the Last Year:   . Arboriculturist in the Last Year:   Transportation Needs:   . Film/video editor (Medical):   Marland Kitchen Lack of Transportation (Non-Medical):   Physical Activity:   . Days of Exercise per Week:   . Minutes of Exercise  per Session:   Stress:   . Feeling of Stress :   Social Connections:   . Frequency of Communication with Friends and Family:   . Frequency of Social Gatherings with Friends and Family:   . Attends Religious Services:   . Active Member of Clubs or Organizations:   . Attends Archivist Meetings:   Marland Kitchen Marital Status:    Past Surgical History:  Procedure Laterality Date  . ABCESS DRAINAGE     abdomen- 26 day hospitalization 1968  . COLONOSCOPY  2016  . HERNIA REPAIR  summer '11   umbilical, dr Ninfa Linden   . POLYPECTOMY    . PROSTATE BIOPSY  06-2013 , 07-2017   (-), (-)   Past Medical History:  Diagnosis Date  . BPH (benign prostatic hyperplasia)    (-) Bx 2015  . Diabetes mellitus without complication ([place])   . Elevated PSA    Prostate Bx in 06/2013 was benign  . HTN (hypertension)   . Hyperlipidemia   . Macular degeneration, age related    BP 117/78   Pulse 76   Temp 98.5 F (36.9 C)   Ht 5\' 11"  (1.803 m)    Wt 167 lb (75.8 kg)   SpO2 97%   BMI 23.29 kg/m   Opioid Risk Score:   Fall Risk Score:  `1  Depression screen PHQ 2/9  Depression screen [facility] 2/9 12/19/2019 11/29/2019 11/11/2018 10/06/2018 04/28/2018 08/25/2017 04/14/2016  Decreased Interest 0 0 0 0 0 0 0  Down, Depressed, Hopeless 0 0 0 0 0 0 0  PHQ - 2 Score 0 0 0 0 0 0 0  Some recent data might be hidden     Review of Systems  Constitutional: Negative.        Appetite poor and feels like he is still losing weight. (has not weighed since last visit)  HENT: Negative.   Eyes: Negative.   Respiratory: Negative.   Cardiovascular: Negative.   Gastrointestinal: Negative.   Endocrine: Negative.   Genitourinary: Negative.        Hesitancy  Musculoskeletal: Positive for arthralgias and gait problem.  Skin: Negative.   Allergic/Immunologic: Negative.   Neurological: Positive for weakness and numbness.       Tingling  Hematological: Negative.        On eliquis  Psychiatric/Behavioral: Negative.       Objective:   Physical Exam  Constitutional: NAD.  Psych: Normal mood.  Normal behavior. Musc: No pain with ER/IR Left shoulder No pain with shoulder abduction >90 deg Gait: Left hemiparetic Neuro: Alert Motor:  LUE: Shoulder abduction 4/5, elbow flex 4+/5, elbow extension 4-/5, hand grip 4/5 with apraxia, unchanged LLE: HF, KE, ADF 4+/5,improving Mas: left elbow flexors: 1/4, limited due to patient resistance, stable    Assessment & Plan:  Right-handed male with history of diabetes mellitus, hypertension presents for follow up for right thalamic hemorrhage, now with some spasticity.   1. Left-sided weakness secondary to right thalamic hemorrhage secondary to hypertensive crisis now with spasticity Completed therapies, continue  HEP Cont follow up with Neurology  ?benefit with Botox: Left Biceps: 100units Left upper traps:  100units Left Med Pec: 100units             Cervical xray personally reviewed, showing muscle spasms and degenerative changes >C5-6 - Will order MRI             Shoulder xray reviewed showing shoulder OA  Sternoclavicular xray reviewed, unremarkable  Will consider NCS/EMG  2. Pain Management:  ?No benefit with Baclofen, ?taking at presents, pt states daughter manages  No benefit with Elavil, d/ced             No benefit with Lyrica, d/ced Tylenol as needed D/ced Tramadol Cont ROM             Continue Tizanidine 4mg  BID PRN (insurance does not cover Robaxin) Will consider PNS  3. Gait abnormality Completed therapies, cont HEP  Continue cane for safety  4. Post stroke shoulder pain Subacromial steroid injection under ulatrasoundwith benefit in pain and ROM             Intraarticular steroid injection with benefit in ROM and "stiffness"  5. Myalgia  Minimal benefit with trigger point injections  See #2

## 2019-12-20 ENCOUNTER — Ambulatory Visit: Payer: Medicare Other | Admitting: Physical Medicine & Rehabilitation

## 2019-12-24 ENCOUNTER — Other Ambulatory Visit: Payer: Self-pay | Admitting: Internal Medicine

## 2020-01-04 ENCOUNTER — Encounter: Payer: Self-pay | Admitting: Internal Medicine

## 2020-01-12 ENCOUNTER — Encounter: Payer: Self-pay | Admitting: Internal Medicine

## 2020-01-12 DIAGNOSIS — H40009 Preglaucoma, unspecified, unspecified eye: Secondary | ICD-10-CM | POA: Insufficient documentation

## 2020-01-17 ENCOUNTER — Encounter: Payer: Medicare Other | Admitting: Physical Medicine & Rehabilitation

## 2020-01-21 ENCOUNTER — Ambulatory Visit
Admission: RE | Admit: 2020-01-21 | Discharge: 2020-01-21 | Disposition: A | Discharge status: Home / Self Care | Payer: Medicare Other | Source: Ambulatory Visit | Attending: Physical Medicine & Rehabilitation | Admitting: Physical Medicine & Rehabilitation

## 2020-01-21 DIAGNOSIS — M4319 Spondylolisthesis, multiple sites in spine: Secondary | ICD-10-CM | POA: Diagnosis not present

## 2020-01-21 DIAGNOSIS — M792 Neuralgia and neuritis, unspecified: Secondary | ICD-10-CM

## 2020-01-21 DIAGNOSIS — M4802 Spinal stenosis, cervical region: Secondary | ICD-10-CM | POA: Diagnosis not present

## 2020-01-21 DIAGNOSIS — M4722 Other spondylosis with radiculopathy, cervical region: Secondary | ICD-10-CM | POA: Diagnosis not present

## 2020-01-21 DIAGNOSIS — G8929 Other chronic pain: Secondary | ICD-10-CM

## 2020-01-21 DIAGNOSIS — I69354 Hemiplegia and hemiparesis following cerebral infarction affecting left non-dominant side: Secondary | ICD-10-CM

## 2020-01-21 DIAGNOSIS — M2578 Osteophyte, vertebrae: Secondary | ICD-10-CM | POA: Diagnosis not present

## 2020-01-21 DIAGNOSIS — M25512 Pain in left shoulder: Secondary | ICD-10-CM

## 2020-01-21 MED ORDER — GADOBENATE DIMEGLUMINE 529 MG/ML IV SOLN
15.0000 mL | Freq: Once | INTRAVENOUS | Status: AC | PRN
  Administered 2020-01-21: 15 mL via INTRAVENOUS

## 2020-01-24 ENCOUNTER — Other Ambulatory Visit: Payer: Self-pay

## 2020-01-24 ENCOUNTER — Encounter: Payer: Medicare Other | Attending: Physical Medicine & Rehabilitation | Admitting: Physical Medicine & Rehabilitation

## 2020-01-24 ENCOUNTER — Encounter: Payer: Self-pay | Admitting: Physical Medicine & Rehabilitation

## 2020-01-24 VITALS — BP 131/88 | HR 92 | Temp 97.7°F | Ht 71.0 in | Wt 162.0 lb

## 2020-01-24 DIAGNOSIS — M25512 Pain in left shoulder: Secondary | ICD-10-CM | POA: Diagnosis not present

## 2020-01-24 DIAGNOSIS — M792 Neuralgia and neuritis, unspecified: Secondary | ICD-10-CM | POA: Diagnosis not present

## 2020-01-24 DIAGNOSIS — G8110 Spastic hemiplegia affecting unspecified side: Secondary | ICD-10-CM

## 2020-01-24 DIAGNOSIS — I69354 Hemiplegia and hemiparesis following cerebral infarction affecting left non-dominant side: Secondary | ICD-10-CM | POA: Diagnosis not present

## 2020-01-24 DIAGNOSIS — G8929 Other chronic pain: Secondary | ICD-10-CM | POA: Insufficient documentation

## 2020-01-24 DIAGNOSIS — E119 Type 2 diabetes mellitus without complications: Secondary | ICD-10-CM | POA: Insufficient documentation

## 2020-01-24 DIAGNOSIS — R269 Unspecified abnormalities of gait and mobility: Secondary | ICD-10-CM | POA: Diagnosis not present

## 2020-01-24 DIAGNOSIS — R208 Other disturbances of skin sensation: Secondary | ICD-10-CM | POA: Diagnosis not present

## 2020-01-24 DIAGNOSIS — I10 Essential (primary) hypertension: Secondary | ICD-10-CM | POA: Diagnosis not present

## 2020-01-24 NOTE — Progress Notes (Signed)
Subjective:    Patient ID: Marcus Beasley, male    DOB: June 06, 1946, 74 y.o.   MRN: [id number]  HPI:  Right-handed male with history of diabetes mellitus, hypertension presents for follow up for right thalamic hemorrhage, now with spasticity.   Last clinic visit 12/19/19.  Since that time, pt had MRI.  Reviewed with patient.  Patient continues with HEP. He does not notice difference with Tizanidine. Denies falls.  Pain Inventory Average Pain 6 Pain Right Now 6 My pain is tingling and aching  In the last 24 hours, has pain interfered with the following? General activity 6 Relation with others 6 Enjoyment of life 6 What TIME of day is your pain at its worst? morning and night Sleep (in general) Fair  Pain is worse with: some activites Pain improves with: rest, therapy/exercise and injections Relief from Meds: 4  Mobility walk with assistance use a cane ability to climb steps?  yes do you drive?  no  Function retired I need assistance with the following:  meal prep and shopping  Neuro/Psych numbness tingling  Prior Studies Any changes since last visit?  no  Physicians involved in your care Any changes since last visit?  no   Family History  Problem Relation Age of Onset   Leukemia Mother    Heart attack Father        MI age 11   Heart disease Sister        age 68   Cancer - Other Sister        type   Heart disease Brother        age 17   Kidney disease Brother        HD   Diabetes Maternal Grandmother    Prostate cancer Neg Hx    Colon cancer Neg Hx    Esophageal cancer Neg Hx    Rectal cancer Neg Hx    Stomach cancer Neg Hx    Social History   Socioeconomic History   Marital status: Married    Spouse name: Not on file   Number of children: 3   Years of education: 14   Highest education level: Not on file  Occupational History   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 2 dtrs , 1 son - homicide. 5 grandchildren.     Household: pt, wife, daughter and son   Does not drive    Social Determinants of Radio broadcast assistant Strain:    Difficulty of Paying Living Expenses:   Food Insecurity:    Worried About Charity fundraiser in the Last Year:    Arboriculturist in the Last Year:   Transportation Needs:    Film/video editor (Medical):    Lack of Transportation (Non-Medical):   Physical Activity:    Days of Exercise per Week:    Minutes of Exercise per Session:   Stress:    Feeling of Stress :   Social Connections:    Frequency of Communication  with Friends and Family:    Frequency of Social Gatherings with Friends and Family:    Attends Religious Services:    Active Member of Clubs or Organizations:    Attends Archivist Meetings:    Marital Status:    Past Surgical History:  Procedure Laterality Date   ABCESS DRAINAGE     abdomen- 26 day hospitalization 1968   COLONOSCOPY  2016   HERNIA REPAIR  summer '11   umbilical, dr Ninfa Linden    POLYPECTOMY     PROSTATE BIOPSY  06-2013 , 07-2017   (-), (-)   Past Medical History:  Diagnosis Date   BPH (benign prostatic hyperplasia)    (-) Bx 2015   Diabetes mellitus without complication ([facility])    Elevated PSA    Prostate Bx in 06/2013 was benign   Glaucoma suspect    HTN (hypertension)    Hyperlipidemia    Macular degeneration, age related    BP 131/88    Pulse 92    Temp 97.7 F (36.5 C)    Ht 5\' 11"  (1.803 m)    Wt 162 lb (73.5 kg)    SpO2 95%    BMI 22.59 kg/m   Opioid Risk Score:   Fall Risk Score:   `1  Depression screen PHQ 2/9  Depression screen [facility] 2/9 12/19/2019 11/29/2019 11/11/2018 10/06/2018 04/28/2018 08/25/2017 04/14/2016  Decreased Interest 0 0 0 0 0 0 0  Down, Depressed, Hopeless 0 0 0 0 0 0 0  PHQ - 2 Score 0 0 0 0 0 0 0  Some recent data might be hidden     Review of Systems  Constitutional: Negative.        Appetite poor and feels like he is still losing weight. (has not weighed since last visit)  HENT: Negative.   Eyes: Negative.   Respiratory: Negative.   Cardiovascular: Negative.   Gastrointestinal: Negative.   Endocrine: Negative.   Genitourinary: Negative.        Hesitancy  Musculoskeletal: Positive for arthralgias and gait problem.  Skin: Negative.   Allergic/Immunologic: Negative.   Neurological: Positive for weakness and numbness.       Tingling  Hematological: Negative.        On eliquis  Psychiatric/Behavioral: Negative.   All other systems reviewed and are negative.     Objective:   Physical Exam  Constitutional: NAD.  Psych: Normal mood.  Normal behavior. Musc: No pain with ER/IR Left shoulder No pain with shoulder abduction >90 deg Gait: Left hemiparetic Neuro: Alert Motor:  LUE: Shoulder abduction 4/5, elbow flex 4+/5, elbow extension 4-/5, hand grip 4/5 with apraxia, improving LLE: HF, KE, ADF 4+/5, improving Mas: left elbow flexors: 1/4, limited due to patient resistance, stable    Assessment & Plan:  Right-handed male with history of diabetes mellitus, hypertension presents for follow up for right thalamic hemorrhage, now with some spasticity.   1. Left-sided weakness secondary to right thalamic hemorrhage secondary to hypertensive crisis now with spasticity Completed therapies, continue  HEP Cont follow up with Neurology  ?benefit with Botox: Left Biceps: 100units Left upper traps: 100units Left Med Pec: 100units             Cervical  xray personally reviewed, showing muscle spasms and degenerative changes >C5-6  MRI reviewed with patient, degenerative changes with facet pathology of C7-T1.              Shoulder xray reviewed showing shoulder OA  Sternoclavicular xray reviewed, unremarkable  2. Pain Management:  ?No benefit with Baclofen  No benefit with Elavil, d/ced             No benefit with Lyrica, d/ced  No benefit with Tizanidine, d/ced Tylenol as needed D/ced Tramadol Cont ROM Will consider PNS  3. Gait abnormality Completed therapies, cont HEP  Continue cane for safety  4. Post stroke shoulder pain Subacromial steroid injection under ulatrasoundwith benefit in pain and ROM             Intraarticular steroid injection with benefit in ROM and "stiffness"  5. Myalgia  Minimal benefit with trigger point injections  See #2  6. LUE dysesthesias and tightness  See #1  Will schedule for NCS/EMG  Likely related to stroke, however, given MRI findings, will rule out confounding factors

## 2020-01-28 ENCOUNTER — Other Ambulatory Visit: Payer: Self-pay | Admitting: Internal Medicine

## 2020-02-01 ENCOUNTER — Telehealth: Payer: Self-pay | Admitting: Cardiology

## 2020-02-01 NOTE — Telephone Encounter (Signed)
Spoke to patient Eliquis 5 mg samples left at [facility] office front desk. 

## 2020-02-01 NOTE — Telephone Encounter (Signed)
Patient calling the office for samples of medication:   1.  What medication and dosage are you requesting samples for? apixaban (ELIQUIS) 5 MG TABS tablet  2.  Are you currently out of this medication? No    

## 2020-02-09 ENCOUNTER — Encounter (HOSPITAL_BASED_OUTPATIENT_CLINIC_OR_DEPARTMENT_OTHER): Payer: Medicare Other | Admitting: Physical Medicine & Rehabilitation

## 2020-02-09 ENCOUNTER — Other Ambulatory Visit: Payer: Self-pay

## 2020-02-09 ENCOUNTER — Encounter: Payer: Self-pay | Admitting: Physical Medicine & Rehabilitation

## 2020-02-09 VITALS — BP 138/85 | HR 82 | Temp 97.7°F | Ht 71.0 in | Wt 163.0 lb

## 2020-02-09 DIAGNOSIS — I69354 Hemiplegia and hemiparesis following cerebral infarction affecting left non-dominant side: Secondary | ICD-10-CM | POA: Diagnosis not present

## 2020-02-09 DIAGNOSIS — R208 Other disturbances of skin sensation: Secondary | ICD-10-CM

## 2020-02-09 DIAGNOSIS — M25512 Pain in left shoulder: Secondary | ICD-10-CM | POA: Diagnosis not present

## 2020-02-09 DIAGNOSIS — G8110 Spastic hemiplegia affecting unspecified side: Secondary | ICD-10-CM | POA: Diagnosis not present

## 2020-02-09 DIAGNOSIS — E119 Type 2 diabetes mellitus without complications: Secondary | ICD-10-CM | POA: Diagnosis not present

## 2020-02-09 DIAGNOSIS — M792 Neuralgia and neuritis, unspecified: Secondary | ICD-10-CM | POA: Diagnosis not present

## 2020-02-09 DIAGNOSIS — G8929 Other chronic pain: Secondary | ICD-10-CM | POA: Diagnosis not present

## 2020-02-09 DIAGNOSIS — I10 Essential (primary) hypertension: Secondary | ICD-10-CM | POA: Diagnosis not present

## 2020-02-09 NOTE — Progress Notes (Signed)
Please see media tab for full results 

## 2020-02-23 DIAGNOSIS — H353131 Nonexudative age-related macular degeneration, bilateral, early dry stage: Secondary | ICD-10-CM | POA: Diagnosis not present

## 2020-02-23 DIAGNOSIS — H35033 Hypertensive retinopathy, bilateral: Secondary | ICD-10-CM | POA: Diagnosis not present

## 2020-02-23 DIAGNOSIS — H40013 Open angle with borderline findings, low risk, bilateral: Secondary | ICD-10-CM | POA: Diagnosis not present

## 2020-02-23 DIAGNOSIS — H35372 Puckering of macula, left eye: Secondary | ICD-10-CM | POA: Diagnosis not present

## 2020-03-08 ENCOUNTER — Ambulatory Visit (INDEPENDENT_AMBULATORY_CARE_PROVIDER_SITE_OTHER): Payer: Medicare Other | Admitting: Internal Medicine

## 2020-03-08 ENCOUNTER — Other Ambulatory Visit: Payer: Self-pay

## 2020-03-08 ENCOUNTER — Encounter: Payer: Self-pay | Admitting: Internal Medicine

## 2020-03-08 VITALS — BP 126/84 | HR 71 | Temp 98.0°F | Resp 18 | Ht 71.0 in | Wt 158.0 lb

## 2020-03-08 DIAGNOSIS — Z23 Encounter for immunization: Secondary | ICD-10-CM | POA: Diagnosis not present

## 2020-03-08 DIAGNOSIS — E119 Type 2 diabetes mellitus without complications: Secondary | ICD-10-CM

## 2020-03-08 DIAGNOSIS — L84 Corns and callosities: Secondary | ICD-10-CM

## 2020-03-08 DIAGNOSIS — R634 Abnormal weight loss: Secondary | ICD-10-CM | POA: Diagnosis not present

## 2020-03-08 DIAGNOSIS — I10 Essential (primary) hypertension: Secondary | ICD-10-CM

## 2020-03-08 NOTE — Patient Instructions (Addendum)
Check the  blood pressure 2 or 3 times a week  BP GOAL is between 110/65 and  135/85. If it is consistently higher or lower, let me know  GO TO THE LAB : Get the blood work     [place], [place] back for a physical exam  in 3 to 4 months

## 2020-03-08 NOTE — Progress Notes (Signed)
Pre visit review using our clinic review tool, if applicable. No additional management support is needed unless otherwise documented below in the visit note.

## 2020-03-08 NOTE — Progress Notes (Signed)
Subjective:    Patient ID: Marcus Beasley, male    DOB: [birth year], 74 y.o.   MRN: [id number]  DOS:  03/08/2020 Type of visit - description: f/u  Since the last office visit he is doing well.  Has noted a corn at the left foot that bothers him.  BP sometimes are in the low side but he has no symptoms  Has noted weight loss. Reports his appetite is good. No postprandial abdominal pain No nausea, vomiting, diarrhea.  No blood in the stools. No depression or anxiety.  Ambulatory CBGs range from 100-140   Wt Readings from Last 3 Encounters:  03/08/20 158 lb (71.7 kg)  02/09/20 163 lb (73.9 kg)  01/24/20 162 lb (73.5 kg)   BP Readings from Last 3 Encounters:  03/08/20 126/84  02/09/20 138/85  01/24/20 131/88     Review of Systems See above   Past Medical History:  Diagnosis Date  . BPH (benign prostatic hyperplasia)    (-) Bx 2015  . Diabetes mellitus without complication ([place])   . Elevated PSA    Prostate Bx in 06/2013 was benign  . Glaucoma suspect   . HTN (hypertension)   . Hyperlipidemia   . Macular degeneration, age related     Past Surgical History:  Procedure Laterality Date  . ABCESS DRAINAGE     abdomen- 26 day hospitalization 1968  . COLONOSCOPY  2016  . HERNIA REPAIR  summer '11   umbilical, dr Ninfa Linden   . POLYPECTOMY    . PROSTATE BIOPSY  06-2013 , 07-2017   (-), (-)    Allergies as of 03/08/2020   No Known Allergies     Medication List       Accurate as of March 08, 2020 11:59 PM. If you have any questions, ask your nurse or doctor.        STOP taking these medications   amitriptyline 25 MG tablet Commonly known as: ELAVIL Stopped by: Kathlene November, MD     TAKE these medications   amLODipine 5 MG tablet Commonly known as: NORVASC Take 1 tablet (5 mg total) by mouth daily.   apixaban 5 MG Tabs tablet Commonly known as: Eliquis Take 1 tablet (5 mg total) by mouth 2 (two) times daily.   atorvastatin 40 MG tablet Commonly  known as: LIPITOR Take 1 tablet (40 mg total) by mouth daily. Please make annual appt with Dr. Martinique for refills. [phone]. 1st attempt.   diltiazem 240 MG 24 hr capsule Commonly known as: CARDIZEM CD TAKE 1 CAPSULE BY MOUTH EVERY DAY   finasteride 5 MG tablet Commonly known as: PROSCAR Take 5 mg by mouth daily.   meclizine 25 MG tablet Commonly known as: ANTIVERT Take 1 tablet (25 mg total) by mouth 2 (two) times daily as needed for dizziness.   metFORMIN 850 MG tablet Commonly known as: GLUCOPHAGE Take 1 tablet (850 mg total) by mouth 2 (two) times daily with a meal.   metoprolol tartrate 50 MG tablet Commonly known as: LOPRESSOR Take 1 tablet (50 mg total) by mouth 2 (two) times daily.   multivitamin with minerals Tabs tablet Take 1 tablet by mouth daily.   OneTouch Verio test strip Generic drug: glucose blood CHECK BLOOD SUGAR NO MORE THAN TWICE DAILY   PRESERVISION/LUTEIN PO Take 1 tablet by mouth daily.   Restasis 0.05 % ophthalmic emulsion Generic drug: cycloSPORINE 1 drop 2 (two) times daily.   tamsulosin 0.4 MG Caps capsule Commonly known as: FLOMAX  Take 0.4 mg by mouth daily.          Objective:   Physical Exam BP 126/84 (BP Location: Left Arm, Patient Position: Sitting, Cuff Size: Small)   Pulse 71   Temp 98 F (36.7 C) (Oral)   Resp 18   Ht 5\' 11"  (1.803 m)   Wt 158 lb (71.7 kg)   SpO2 98%   BMI 22.04 kg/m  General:   Well developed, NAD, BMI noted.  HEENT:  Normocephalic . Face symmetric, atraumatic Lungs:  CTA B Normal respiratory effort, no intercostal retractions, no accessory muscle use. Heart: RRR,  no murmur.  Abdomen:  Not distended, soft, non-tender. No rebound or rigidity.  No bruits or mass. Skin: Not pale. Not jaundice DM foot exam: No edema, good pedal pulses, pinprick normal. He has preulcerative callus at the left fifth toe. Neurologic:  alert & oriented X3.  Speech normal, gait appropriate for age and  unassisted Psych--  Cognition and judgment appear intact.  Cooperative with normal attention span and concentration.  Behavior appropriate. No anxious or depressed appearing.     Assessment      Assessment DM  ---->  DX 07-2015, A1c 8.0; started metformin Neuropathy, mild.  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  Hep C serologies negative, hep B surface Ab+, hep B core antibody negative (consistent with vaccination)  PLAN DM: Last A1c  low, was rec to decrease Metformin dose but he did not.  Recheck A1c and micro. Callus, left fifth toe: Denies any ulcers, bleeding or irritation, recommend to protect with with a donought, declined for now a podiatrist referral HTN: On amlodipine, Cardizem, metoprolol.  Occasionally systolic BP drops to day 408 but he is asxc.  At the office, all BPs are okay.  Continue monitoring. Weight loss: Weight used to be in the 170s up to earlier this year now 158 pounds.  ROS (-), encouraged a  healthy, low-carb, nutritious diet.  Declined nutritionist referral. Preventive care: High-dose flu shot today RTC 4 months CPX   This visit occurred during the SARS-CoV-2 public health emergency.  Safety protocols were in place, including screening questions prior to the visit, additional usage of staff PPE, and extensive cleaning of exam room while observing appropriate contact time as indicated for disinfecting solutions.

## 2020-03-09 LAB — CBC WITH DIFFERENTIAL/PLATELET
Absolute Monocytes: 543 cells/uL (ref 200–950)
Basophils Absolute: 22 cells/uL (ref 0–200)
Basophils Relative: 0.4 %
Eosinophils Absolute: 50 cells/uL (ref 15–500)
Eosinophils Relative: 0.9 %
HCT: 40.8 % (ref 38.5–50.0)
Hemoglobin: 13.1 g/dL — ABNORMAL LOW (ref 13.2–17.1)
Lymphs Abs: 1518 cells/uL (ref 850–3900)
MCH: 28.5 pg (ref 27.0–33.0)
MCHC: 32.1 g/dL (ref 32.0–36.0)
MCV: 88.7 fL (ref 80.0–100.0)
MPV: 10.6 fL (ref 7.5–12.5)
Monocytes Relative: 9.7 %
Neutro Abs: 3466 cells/uL (ref 1500–7800)
Neutrophils Relative %: 61.9 %
Platelets: 297 10*3/uL (ref 140–400)
RBC: 4.6 10*6/uL (ref 4.20–5.80)
RDW: 13.5 % (ref 11.0–15.0)
Total Lymphocyte: 27.1 %
WBC: 5.6 10*3/uL (ref 3.8–10.8)

## 2020-03-09 LAB — MICROALBUMIN / CREATININE URINE RATIO
Creatinine, Urine: 203 mg/dL (ref 20–320)
Microalb Creat Ratio: 7 mcg/mg creat (ref <30)
Microalb, Ur: 1.5 mg/dL

## 2020-03-09 LAB — BASIC METABOLIC PANEL
BUN: 15 mg/dL (ref 7–25)
CO2: 25 mmol/L (ref 20–32)
Calcium: 9.6 mg/dL (ref 8.6–10.3)
Chloride: 104 mmol/L (ref 98–110)
Creat: 0.9 mg/dL (ref 0.70–1.18)
Glucose, Bld: 90 mg/dL (ref 65–99)
Potassium: 4.8 mmol/L (ref 3.5–5.3)
Sodium: 140 mmol/L (ref 135–146)

## 2020-03-09 LAB — TSH: TSH: 0.77 mIU/L (ref 0.40–4.50)

## 2020-03-09 LAB — HEMOGLOBIN A1C
Hgb A1c MFr Bld: 5.8 % of total Hgb — ABNORMAL HIGH (ref <5.7)
Mean Plasma Glucose: 120 (calc)
eAG (mmol/L): 6.6 (calc)

## 2020-03-11 NOTE — Assessment & Plan Note (Signed)
DM: Last A1c  low, was rec to decrease Metformin dose but he did not.  Recheck A1c and micro. Callus, left fifth toe: Denies any ulcers, bleeding or irritation, recommend to protect with with a donought, declined for now a podiatrist referral HTN: On amlodipine, Cardizem, metoprolol.  Occasionally systolic BP drops to day 806 but he is asxc.  At the office, all BPs are okay.  Continue monitoring. Weight loss: Weight used to be in the 170s up to earlier this year now 158 pounds.  ROS (-), encouraged a  healthy, low-carb, nutritious diet.  Declined nutritionist referral. Preventive care: High-dose flu shot today RTC 4 months CPX

## 2020-03-13 ENCOUNTER — Telehealth: Payer: Self-pay | Admitting: Cardiology

## 2020-03-13 NOTE — Telephone Encounter (Signed)
  Patient calling the office for samples of medication:   1.  What medication and dosage are you requesting samples for?   apixaban (ELIQUIS) 5 MG TABS tablet  2.  Are you currently out of this medication?   Has a few days left. He will be seeing Fabian Sharp on Thurs 9/23 and would like to pick up samples then.

## 2020-03-13 NOTE — Telephone Encounter (Signed)
Medication samples have been provided to the patient.  Drug name: eliquis 5mg   Qty: 3 boxes  LOT: [id number]  Exp.Date: 08/2021  Samples in [place], [place] mail box for appointment on 03/15/20 with patient assistance application in bag also

## 2020-03-14 NOTE — Progress Notes (Signed)
Cardiology Office Note:    Date:  03/15/2020   ID:  Marcus Beasley, DOB 07-Apr-1946, MRN [id number]  PCP:  Colon Branch, MD  Cardiologist:  Peter Martinique, MD   Referring MD: Colon Branch, MD   Chief Complaint  Patient presents with  . Follow-up    PAF    History of Present Illness:    Marcus Beasley is a 74 y.o. male with a hx of Afib, prior hemorrhagic stroke, HTN, DM2, HLD. Hemorrhagic stroke 03/12/18 felt to be secondary to uncontrolled HTN. Developed Afib during that admission and started on cardizem. AC held. Discharged to CIR. Follow up CT with resolution of bleed and neurology OK'ed anticoagulation. He was last seen by Dr. Martinique 08/01/19 and was doing well - had completed PT.   He presents today for routine follow up. No chest pain, SOB, lower extremity swelling, orthopnea, or syncope. His main complaint is left arm and left leg numbness. He is mildly upset that the thinks he has lost 2 lbs. He also complains of left neck, left arm, left chest and left leg stiffness since his stroke. I do not think this is cardiac in nature. He has been released from neurology and is doing PT at home.    Past Medical History:  Diagnosis Date  . BPH (benign prostatic hyperplasia)    (-) Bx 2015  . Diabetes mellitus without complication ([place])   . Elevated PSA    Prostate Bx in 06/2013 was benign  . Glaucoma suspect   . HTN (hypertension)   . Hyperlipidemia   . Macular degeneration, age related     Past Surgical History:  Procedure Laterality Date  . ABCESS DRAINAGE     abdomen- 26 day hospitalization 1968  . COLONOSCOPY  2016  . HERNIA REPAIR  summer '11   umbilical, dr Ninfa Linden   . POLYPECTOMY    . PROSTATE BIOPSY  06-2013 , 07-2017   (-), (-)    Current Medications: Current Meds  Medication Sig  . amLODipine (NORVASC) 5 MG tablet Take 1 tablet (5 mg total) by mouth daily.  Marland Kitchen apixaban (ELIQUIS) 5 MG TABS tablet Take 1 tablet (5 mg total) by mouth 2 (two) times daily.  Marland Kitchen  atorvastatin (LIPITOR) 40 MG tablet Take 1 tablet (40 mg total) by mouth daily. Please make annual appt with Dr. Martinique for refills. [phone]. 1st attempt.  . diltiazem (CARDIZEM CD) 240 MG 24 hr capsule TAKE 1 CAPSULE BY MOUTH EVERY DAY  . finasteride (PROSCAR) 5 MG tablet Take 5 mg by mouth daily.  . meclizine (ANTIVERT) 25 MG tablet Take 1 tablet (25 mg total) by mouth 2 (two) times daily as needed for dizziness.  . metFORMIN (GLUCOPHAGE) 850 MG tablet Take 1 tablet (850 mg total) by mouth 2 (two) times daily with a meal.  . metoprolol tartrate (LOPRESSOR) 50 MG tablet Take 1 tablet (50 mg total) by mouth 2 (two) times daily.  . Multiple Vitamin (MULTIVITAMIN WITH MINERALS) TABS tablet Take 1 tablet by mouth daily.   . Multiple Vitamins-Minerals (PRESERVISION/LUTEIN PO) Take 1 tablet by mouth daily.   Glory Rosebush VERIO test strip CHECK BLOOD SUGAR NO MORE THAN TWICE DAILY  . RESTASIS 0.05 % ophthalmic emulsion 1 drop 2 (two) times daily.  . tamsulosin (FLOMAX) 0.4 MG CAPS capsule Take 0.4 mg by mouth daily.     Allergies:   Patient has no known allergies.   Social History   Socioeconomic History  . Marital status:  Married    Spouse name: Not on file  . Number of children: 3  . Years of education: 15  . Highest education level: Not on file  Occupational History  . 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 Smoking status: Never Smoker  . Smokeless tobacco: Never Used  Vaping Use  . Vaping Use: Never used  Substance and Sexual Activity  . Alcohol use: Yes    Comment: occ. beer  . Drug use: No  . Sexual activity: Yes    Partners: Female  Other Topics Concern  . 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 2 dtrs , 1 son - homicide. 5 grandchildren.     Household: pt, wife, daughter and son   Does not drive    Social Determinants of Radio broadcast assistant Strain:   . Difficulty  of Paying Living Expenses: Not on file  Food Insecurity:   . Worried About Charity fundraiser in the Last Year: Not on file  . Ran Out of Food in the Last Year: Not on file  Transportation Needs:   . Lack of Transportation (Medical): Not on file  . Lack of Transportation (Non-Medical): Not on file  Physical Activity:   . Days of Exercise per Week: Not on file  . Minutes of Exercise per Session: Not on file  Stress:   . Feeling of Stress : Not on file  Social Connections:   . Frequency of Communication with Friends and Family: Not on file  . Frequency of Social Gatherings with Friends and Family: Not on file  . Attends Religious Services: Not on file  . Active Member of Clubs or Organizations: Not on file  . Attends Archivist Meetings: Not on file  . Marital Status: Not on file     Family History: The patient's family history includes Cancer - Other in his sister; Diabetes in his maternal grandmother; Heart attack in his father; Heart disease in his brother and sister; Kidney disease in his brother; Leukemia in his mother. There is no history of Prostate cancer, Colon cancer, Esophageal cancer, Rectal cancer, or Stomach cancer.  ROS:   Please see the history of present illness.     All other systems reviewed and are negative.  EKGs/Labs/Other Studies Reviewed:    The following studies were reviewed today:  Echo 2019: Study Conclusions   - Left ventricle: The cavity size was normal. Wall thickness was  increased in a pattern of mild LVH. Systolic function was  vigorous. The estimated ejection fraction was in the range of 65%  to 70%. Wall motion was normal; there were no regional wall  motion abnormalities.  - Aortic valve: There was trivial regurgitation.  EKG:  EKG is ordered today.  The ekg ordered today demonstrates sinus rhythm HR 70  Recent Labs: 06/02/2019: ALT 19 03/08/2020: BUN 15; Creat 0.90; Hemoglobin 13.1; Platelets 297; Potassium 4.8;  Sodium 140; TSH 0.77  Recent Lipid Panel    Component Value Date/Time   CHOL 125 06/02/2019 1036   CHOL 141 01/20/2019 0922   TRIG 66.0 06/02/2019 1036   HDL 43.20 06/02/2019 1036   HDL 44 01/20/2019 0922   CHOLHDL 3 06/02/2019 1036   VLDL 13.2 06/02/2019 1036   LDLCALC 69 06/02/2019 1036   LDLCALC 85 01/20/2019 0922    Physical Exam:    VS:  BP 122/78  Pulse 70   Ht 5\' 11"  (1.803 m)   Wt 160 lb 6.4 oz (72.8 kg)   SpO2 99%   BMI 22.37 kg/m     Wt Readings from Last 3 Encounters:  03/15/20 160 lb 6.4 oz (72.8 kg)  03/08/20 158 lb (71.7 kg)  02/09/20 163 lb (73.9 kg)     GEN: elderly male in no acute distress HEENT: Normal NECK: No JVD; No carotid bruits LYMPHATICS: No lymphadenopathy CARDIAC: RRR, no murmurs, rubs, gallops RESPIRATORY:  Clear to auscultation without rales, wheezing or rhonchi  ABDOMEN: Soft, non-tender, non-distended MUSCULOSKELETAL:  No edema; No deformity  SKIN: Warm and dry NEUROLOGIC:  Alert and oriented x 3 PSYCHIATRIC:  Normal affect   ASSESSMENT:    1. PAF (paroxysmal atrial fibrillation) ([place])   2. Essential hypertension   3. Chronic anticoagulation   4. Hemorrhagic stroke ([place])   5. Hyperlipidemia, unspecified hyperlipidemia type    PLAN:    In order of problems listed above:  PAF Chronic anticoagulation Hx of hemorrhagic CVA - continue 5 mg eliquis BID - EKG with sinus rhythm today - sounds like his entire left side is stiff since stroke - I do not suspect a cardiac etiology, but will have him back in 3-4 months with Dr. Martinique to make sure he is improving   HTN - amlodipine, cardizem, lopressor - pressure well-controlled today   HLD 06/02/2019: Cholesterol 125; HDL 43.20; LDL Cholesterol 69; Triglycerides 66.0; VLDL 13.2 - continue 40 mg lipitor  Follow up in 3-4 months with Dr. Martinique.   Medication Adjustments/Labs and Tests Ordered: Current medicines are reviewed at length with the patient today.  Concerns  regarding medicines are outlined above.  Orders Placed This Encounter  Procedures  . EKG 12-Lead   No orders of the defined types were placed in this encounter.   Signed, Ledora Bottcher, [place]  03/15/2020 11:26 AM    [facility]

## 2020-03-15 ENCOUNTER — Encounter: Payer: Self-pay | Admitting: Physician Assistant

## 2020-03-15 ENCOUNTER — Ambulatory Visit: Payer: Medicare Other | Admitting: Physician Assistant

## 2020-03-15 ENCOUNTER — Other Ambulatory Visit: Payer: Self-pay

## 2020-03-15 VITALS — BP 122/78 | HR 70 | Ht 71.0 in | Wt 160.4 lb

## 2020-03-15 DIAGNOSIS — I10 Essential (primary) hypertension: Secondary | ICD-10-CM

## 2020-03-15 DIAGNOSIS — Z7901 Long term (current) use of anticoagulants: Secondary | ICD-10-CM

## 2020-03-15 DIAGNOSIS — I619 Nontraumatic intracerebral hemorrhage, unspecified: Secondary | ICD-10-CM | POA: Diagnosis not present

## 2020-03-15 DIAGNOSIS — E785 Hyperlipidemia, unspecified: Secondary | ICD-10-CM | POA: Diagnosis not present

## 2020-03-15 DIAGNOSIS — I480 Paroxysmal atrial fibrillation: Secondary | ICD-10-CM

## 2020-03-15 NOTE — Patient Instructions (Signed)
Medication Instructions:  Your physician recommends that you continue on your current medications as directed. Please refer to the Current Medication list given to you today.  *If you need a refill on your cardiac medications before your next appointment, please call your pharmacy*   Follow-Up: At [facility], you and your health needs are our priority.  As part of our continuing mission to provide you with exceptional heart care, we have created designated Provider Care Teams.  These Care Teams include your primary Cardiologist (physician) and Advanced Practice Providers (APPs -  Physician Assistants and Nurse Practitioners) who all work together to provide you with the care you need, when you need it.  We recommend signing up for the patient portal called "MyChart".  Sign up information is provided on this After Visit Summary.  MyChart is used to connect with patients for Virtual Visits (Telemedicine).  Patients are able to view lab/test results, encounter notes, upcoming appointments, etc.  Non-urgent messages can be sent to your provider as well.   To learn more about what you can do with MyChart, go to [email or web address].    Your next appointment:   Wednesday, 07/18/20 at 11:00 AM  The format for your next appointment:   In Person  Provider:   Peter Martinique, MD

## 2020-03-20 ENCOUNTER — Other Ambulatory Visit: Payer: Self-pay

## 2020-03-20 MED ORDER — APIXABAN 5 MG PO TABS: 5.0000 mg | ORAL_TABLET | Freq: Two times a day (BID) | ORAL | 3 refills | Status: DC

## 2020-03-26 ENCOUNTER — Other Ambulatory Visit: Payer: Self-pay | Admitting: Cardiology

## 2020-03-27 ENCOUNTER — Other Ambulatory Visit: Payer: Self-pay

## 2020-03-27 ENCOUNTER — Encounter: Payer: Self-pay | Admitting: Physical Medicine & Rehabilitation

## 2020-03-27 ENCOUNTER — Encounter: Payer: Medicare Other | Attending: Physical Medicine & Rehabilitation | Admitting: Physical Medicine & Rehabilitation

## 2020-03-27 VITALS — BP 109/86 | Ht 71.0 in | Wt 160.0 lb

## 2020-03-27 DIAGNOSIS — G8110 Spastic hemiplegia affecting unspecified side: Secondary | ICD-10-CM

## 2020-03-27 DIAGNOSIS — M792 Neuralgia and neuritis, unspecified: Secondary | ICD-10-CM

## 2020-03-27 DIAGNOSIS — M791 Myalgia, unspecified site: Secondary | ICD-10-CM

## 2020-03-27 DIAGNOSIS — E1142 Type 2 diabetes mellitus with diabetic polyneuropathy: Secondary | ICD-10-CM

## 2020-03-27 DIAGNOSIS — R269 Unspecified abnormalities of gait and mobility: Secondary | ICD-10-CM | POA: Diagnosis not present

## 2020-03-27 DIAGNOSIS — I10 Essential (primary) hypertension: Secondary | ICD-10-CM

## 2020-03-27 DIAGNOSIS — E119 Type 2 diabetes mellitus without complications: Secondary | ICD-10-CM | POA: Insufficient documentation

## 2020-03-27 DIAGNOSIS — R208 Other disturbances of skin sensation: Secondary | ICD-10-CM | POA: Diagnosis not present

## 2020-03-27 DIAGNOSIS — M5010 Cervical disc disorder with radiculopathy, unspecified cervical region: Secondary | ICD-10-CM

## 2020-03-27 DIAGNOSIS — I69354 Hemiplegia and hemiparesis following cerebral infarction affecting left non-dominant side: Secondary | ICD-10-CM

## 2020-03-27 LAB — PSA: PSA: 4.98

## 2020-03-27 NOTE — Progress Notes (Signed)
Subjective:    Patient ID: Marcus Beasley, male    DOB: Feb 28, 1946, 74 y.o.   MRN: [id number]  TELEHEALTH NOTE  Due to national recommendations of social distancing due to COVID 19, an audio/video telehealth visit is felt to be most appropriate for this patient at this time.  See Chart message from today for the patient's consent to telehealth from [place].     I verified that I am speaking with the correct person using two identifiers.  Location of patient: Home Location of provider: Office Method of communication: Telephone Names of participants : [name] scheduling, [place] Scrape obtaining consent and vitals if available Established patient Time spent on call: 13 minutes  HPI:  Right-handed male with history of diabetes mellitus, hypertension presents for follow up for right thalamic hemorrhage, now with spasticity.   Last clinic visit 02/09/20.  Since that time, pt states he continues HEP. Denies falls. Reviewed NCS/EMG with patient. Last PT session was several months ago.    Pain Inventory Average Pain 8 Pain Right Now 8 My pain is constant, tingling, aching and tightness lower leg.  In the last 24 hours, has pain interfered with the following? General activity 8 Relation with others 0 Enjoyment of life 9 What TIME of day is your pain at its worst? morning Sleep (in general) Poor  Pain is worse with: unsure and some activites Pain improves with: rest, therapy/exercise and pacing activities Relief from Meds: 0    Family History  Problem Relation Age of Onset  . Leukemia Mother   . Heart attack Father        MI age 82  . Heart disease Sister        age 27  . Cancer - Other Sister        type  . Heart disease Brother        age 27  . Kidney disease Brother        HD  . Diabetes Maternal Grandmother   . Prostate cancer Neg Hx   . Colon cancer Neg Hx   . Esophageal cancer Neg Hx   . Rectal cancer Neg Hx   . Stomach  cancer Neg Hx    Social History   Socioeconomic History  . Marital status: Married    Spouse name: Not on file  . Number of children: 3  . Years of education: 12  . Highest education level: Not on file  Occupational History  . 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 Smoking status: Never Smoker  . Smokeless tobacco: Never Used  Vaping Use  . Vaping Use: Never used  Substance and Sexual Activity  . Alcohol use: Yes    Comment: occ. beer  . Drug use: No  . Sexual activity: Yes    Partners: Female  Other Topics Concern  . 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 2 dtrs , 1 son - homicide. 5 grandchildren.     Household: pt, wife, daughter and son   Does not drive    Social Determinants of Radio broadcast assistant Strain:   . Difficulty of Paying Living Expenses: Not on file  Food Insecurity:   . Worried About Charity fundraiser in the Last Year: Not on file  . Ran Out of Food in the Last Year: Not on file  Transportation Needs:   .  Lack of Transportation (Medical): Not on file  . Lack of Transportation (Non-Medical): Not on file  Physical Activity:   . Days of Exercise per Week: Not on file  . Minutes of Exercise per Session: Not on file  Stress:   . Feeling of Stress : Not on file  Social Connections:   . Frequency of Communication with Friends and Family: Not on file  . Frequency of Social Gatherings with Friends and Family: Not on file  . Attends Religious Services: Not on file  . Active Member of Clubs or Organizations: Not on file  . Attends Archivist Meetings: Not on file  . Marital Status: Not on file   Past Surgical History:  Procedure Laterality Date  . ABCESS DRAINAGE     abdomen- 26 day hospitalization 1968  . COLONOSCOPY  2016  . HERNIA REPAIR  summer '11   umbilical, dr Ninfa Linden   . POLYPECTOMY    . PROSTATE BIOPSY  06-2013 , 07-2017    (-), (-)   Past Medical History:  Diagnosis Date  . BPH (benign prostatic hyperplasia)    (-) Bx 2015  . Diabetes mellitus without complication ([place])   . Elevated PSA    Prostate Bx in 06/2013 was benign  . Glaucoma suspect   . HTN (hypertension)   . Hyperlipidemia   . Macular degeneration, age related    There were no vitals taken for this visit.  Opioid Risk Score:   Fall Risk Score:  `1  Depression screen PHQ 2/9  Depression screen [facility] 2/9 02/09/2020 12/19/2019 11/29/2019 11/11/2018 10/06/2018 04/28/2018 08/25/2017  Decreased Interest 0 0 0 0 0 0 0  Down, Depressed, Hopeless 0 0 0 0 0 0 0  PHQ - 2 Score 0 0 0 0 0 0 0  Some recent data might be hidden     Review of Systems  Constitutional: Negative.        Appetite poor and feels like he is still losing weight. (has not weighed since last visit)  HENT: Negative.   Eyes: Negative.   Respiratory: Negative.   Cardiovascular: Negative.   Gastrointestinal: Negative.   Endocrine: Negative.   Genitourinary: Negative.        Hesitancy  Musculoskeletal: Positive for arthralgias, gait problem and myalgias.  Skin: Negative.   Allergic/Immunologic: Negative.   Neurological: Positive for weakness and numbness.       Tingling  Hematological: Negative.        On eliquis  Psychiatric/Behavioral: Negative.   All other systems reviewed and are negative.     Objective:   Physical Exam  Constitutional: NAD.  Psych: Normal mood.  Normal behavior. Neuro: Alert    Assessment & Plan:  Right-handed male with history of diabetes mellitus, hypertension presents for follow up for right thalamic hemorrhage, now with some spasticity.   1. 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              Shoulder xray reviewed showing shoulder OA             Sternoclavicular xray reviewed, unremarkable  Will refer to PT for cervical traction  2. Pain Management:  ?No benefit with Baclofen  No benefit with Elavil, d/ced             No benefit with Lyrica, d/ced  No benefit with Tizanidine, d/ced Tylenol as needed D/ced Tramadol Cont ROM Will consider PNS  3. Gait abnormality Completed therapies, continue HEP  Continue cane for safety  4. Post stroke shoulder pain Subacromial steroid injection under ulatrasoundwith benefit in pain and ROM             Intraarticular steroid injection with benefit in ROM and "stiffness"  5. Myalgia  Minimal benefit with trigger point injections  See #2  6. LUE dysesthesias and tightness  See #1  NCS/EMG showing polyneuropathy, as well as Left C6-T1 radiculopathy, Right C7-8 radiculopathy

## 2020-03-28 ENCOUNTER — Other Ambulatory Visit: Payer: Self-pay

## 2020-03-28 MED ORDER — APIXABAN 5 MG PO TABS: 5.0000 mg | ORAL_TABLET | Freq: Two times a day (BID) | ORAL | 3 refills | Status: DC

## 2020-03-29 ENCOUNTER — Telehealth: Payer: Self-pay

## 2020-03-29 NOTE — Telephone Encounter (Signed)
Marcus Beasley patient assistance forms for Eliquis completed and faxed to fax # [phone] on 03/28/20.

## 2020-04-10 ENCOUNTER — Telehealth: Payer: Self-pay | Admitting: Internal Medicine

## 2020-04-10 DIAGNOSIS — E119 Type 2 diabetes mellitus without complications: Secondary | ICD-10-CM

## 2020-04-10 NOTE — Telephone Encounter (Signed)
Referral placed.

## 2020-04-10 NOTE — Telephone Encounter (Signed)
Caller name: Julie Call back number: [phone]  Patient would like a referral to a podiatry in [place] if possible.

## 2020-04-16 ENCOUNTER — Encounter: Payer: Self-pay | Admitting: Internal Medicine

## 2020-04-17 ENCOUNTER — Other Ambulatory Visit: Payer: Self-pay

## 2020-04-17 ENCOUNTER — Ambulatory Visit: Payer: Medicare Other | Attending: Physical Medicine & Rehabilitation | Admitting: Physical Therapy

## 2020-04-17 DIAGNOSIS — R29818 Other symptoms and signs involving the nervous system: Secondary | ICD-10-CM | POA: Diagnosis not present

## 2020-04-17 DIAGNOSIS — I69254 Hemiplegia and hemiparesis following other nontraumatic intracranial hemorrhage affecting left non-dominant side: Secondary | ICD-10-CM

## 2020-04-17 DIAGNOSIS — M542 Cervicalgia: Secondary | ICD-10-CM | POA: Insufficient documentation

## 2020-04-17 DIAGNOSIS — M5412 Radiculopathy, cervical region: Secondary | ICD-10-CM | POA: Insufficient documentation

## 2020-04-18 NOTE — Therapy (Signed)
[facility] [address] [place], [place], [address] Phone: [phone]   Fax:  [phone]  Physical Therapy Evaluation  Patient Details  Name: Marcus Beasley MRN: [id number] Date of Birth: 1946/02/13 Referring Provider (PT): Dr. Delice Lesch   Encounter Date: 04/17/2020   PT End of Session - 04/18/20 2049    Visit Number 1    Number of Visits 5   eval + 4 visits   Date for PT Re-Evaluation 05/18/20    Authorization Type UHC Medicare    Authorization Time Period 04-17-20 - 06-17-20    PT Start Time 0850    PT Stop Time 0930    PT Time Calculation (min) 40 min    Activity Tolerance Patient tolerated treatment well    Behavior During Therapy [facility] for tasks assessed/performed           Past Medical History:  Diagnosis Date  . BPH (benign prostatic hyperplasia)    (-) Bx 2015  . Diabetes mellitus without complication ([place])   . Elevated PSA    Prostate Bx in 06/2013 was benign  . Glaucoma suspect   . HTN (hypertension)   . Hyperlipidemia   . Macular degeneration, age related     Past Surgical History:  Procedure Laterality Date  . ABCESS DRAINAGE     abdomen- 26 day hospitalization 1968  . COLONOSCOPY  2016  . HERNIA REPAIR  summer '11   umbilical, dr Ninfa Linden   . POLYPECTOMY    . PROSTATE BIOPSY  06-2013 , 07-2017   (-), (-)    There were no vitals filed for this visit.        [facility] PT Assessment - 04/18/20 0001      Assessment   Medical Diagnosis Lt spastic hemiplegia (secondary to Rt thalamic hemorrhage)    Referring Provider (PT) Dr. Delice Lesch    Onset Date/Surgical Date 03/12/18    Prior Therapy Had therapy in OP PT clinic until 08/2018, COVID restrictions; resumed in Oct. 2020 - Jan. 2021      Precautions   Precautions Fall      Restrictions   Weight Bearing Restrictions No      Balance Screen   Has the patient fallen in the past 6 months No    Has the patient had a decrease in  activity level because of a fear of falling?  No    Is the patient reluctant to leave their home because of a fear of falling?  No      Prior Function   Level of Independence Independent with basic ADLs;Independent with household mobility with device;Independent with community mobility with device      ROM / Strength   AROM / PROM / Strength --      AROM   Overall AROM  Deficits    Overall AROM Comments pt has tremors in LUE     AROM Assessment Site Shoulder;Cervical    Right/Left Shoulder Left    Left Shoulder Flexion 85 Degrees    Left Shoulder ABduction 82 Degrees    Cervical - Right Side Bend 20    Cervical - Left Side Bend 15    Cervical - Right Rotation 50    Cervical - Left Rotation 39                      Objective measurements completed on examination: See above findings.  PT Education - 04/18/20 2047    Education Details eval results; discussed benefits of aquatic therapy - pt requests to try aquatic therapy after a few visits of PT to trial use of cervical traction    Person(s) Educated Patient    Methods Explanation    Comprehension Verbalized understanding               PT Long Term Goals - 04/18/20 2101      PT LONG TERM GOAL #1   Title Pt will subjectively report at least 50% improvement in Lt cervical and shoulder tightness with use of cervical traction unit.    Time 4    Period Weeks    Status New    Target Date 05/18/20      PT LONG TERM GOAL #2   Title Improve Lt shoulder active flexion to >/= 95 degrees without c/o pain at end ROM for increased ease with ADL's.    Baseline 85 degrees    Time 4    Period Weeks    Status New    Target Date 05/18/20      PT LONG TERM GOAL #3   Title Independent in HEP for cervical stretching and Lt shoulder AAROM exercises.    Time 4    Period Weeks    Status New    Target Date 05/18/20      PT LONG TERM GOAL #4   Title Refer pt to aquatic therapy if he wishes to  participate in this program after completion of PT.    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  Pt reports he began having increased tightness and spasticity in his Lt shoulder and UE approx. 6 months ago.  Pt denies cervical pain and LUE pain, but does report Lt shoulder pain with active flexion > 85 degrees.  Pt is referred to PT for trial of cervical traction due to C6-T1 radiculopathy and degenerative chagnes with facet pathology C7-T1 and due to increased tightness and spasticity.    Personal Factors and Comorbidities Comorbidity 2;Time since onset of injury/illness/exacerbation    Comorbidities Rt thalamic hemorrhage 03-12-18:  DM, PAF, type 2 DM, cervical disc disorder with radiculopathy    Examination-Activity Limitations Stairs;Transfers;Reach Overhead;Lift;Carry;Locomotion Level    Examination-Participation Restrictions Community Activity;Meal Prep;Shop;Cleaning    Stability/Clinical Decision Making Evolving/Moderate complexity    Clinical Decision Making Moderate    Rehab Potential Good    PT Frequency 1x / week    PT Duration 4 weeks   plus eval   PT Treatment/Interventions ADLs/Self Care Home Management;Electrical Stimulation;Therapeutic exercise;Therapeutic activities;Neuromuscular re-education;Patient/family education;Manual techniques;Aquatic Therapy;Moist Heat;Other (comment);Passive range of motion   cervical traction   PT Next Visit Plan trial cervical traction unit per MD referral (Dr. Posey Pronto) ; HEP for cervical AROM and stretches and Lt shoulder wand exs. for [facility]    PT Home Exercise Plan Access Code: [email or web address]    Consulted and Agree with Plan of Care Patient           Patient will benefit from skilled therapeutic intervention in order to improve the following deficits and  impairments:  Decreased strength, Impaired flexibility, Impaired tone, Increased muscle spasms, Impaired UE functional  use, Impaired sensation  Visit Diagnosis: Hemiplegia and hemiparesis following other nontraumatic intracranial hemorrhage affecting left non-dominant side (HCC) - Plan: PT plan of care cert/re-cert  Cervicalgia - Plan: PT plan of care cert/re-cert  Radiculopathy, cervical region - Plan: PT plan of care cert/re-cert  Other symptoms and signs involving the nervous system - Plan: PT plan of care cert/re-cert     Problem List Patient Active Problem List   Diagnosis Date Noted  . Type 2 diabetes mellitus without complication, without long-term current use of insulin ([place]) 03/27/2020  . Cervical disc disorder with radiculopathy of cervical region 03/27/2020  . Diabetic polyneuropathy associated with type 2 diabetes mellitus ([place]) 03/27/2020  . Dysesthesia 01/24/2020  . Glaucoma suspect 01/12/2020  . Myalgia 11/29/2019  . Primary osteoarthritis of left shoulder 10/27/2019  . Chronic left shoulder pain 05/12/2019  . Subacromial bursitis of left shoulder joint 09/10/2018  . Spastic hemiplegia affecting nondominant side ([place]) 07/07/2018  . Abnormality of gait 06/09/2018  . Neuropathic pain   . Labile blood pressure   . PAF (paroxysmal atrial fibrillation) ([place])   . Hemiparesis affecting left side as late effect of stroke ([place])   . Thalamic hemorrhage ([place]) 03/17/2018  . Benign essential HTN   . Dyslipidemia   . Hemorrhagic stroke ([place])   . ICH (intracerebral hemorrhage) ([place]) 03/12/2018  . Prostate cancer ([place]) 01/29/2017  . 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 Diabetes ([place]) 12/14/2015  . PCP NOTES >>>>>>>>>>>>>>>>>>>>>>>>>>>>>>. 08/06/2015  . Dizziness and giddiness 01/29/2015  . Umbilical hernia 03/29/1218  . Elevated PSA, less than 10 ng/ml 04/19/2012  . Annual physical exam 01/24/2011  . Hyperlipidemia 01/03/2010  . Essential hypertension  09/20/2007    Alda Lea, PT 04/18/2020, 9:13 PM  [facility] [address] [place], [place], [address] Phone: [phone]   Fax:  [phone]  Name: DIMITRY HOLSWORTH MRN: [id number] Date of Birth: 1945-10-24

## 2020-04-25 ENCOUNTER — Ambulatory Visit: Payer: Medicare Other | Attending: Physical Medicine & Rehabilitation | Admitting: Physical Therapy

## 2020-04-25 ENCOUNTER — Other Ambulatory Visit: Payer: Self-pay

## 2020-04-25 ENCOUNTER — Encounter: Payer: Self-pay | Admitting: Physical Therapy

## 2020-04-25 DIAGNOSIS — M5412 Radiculopathy, cervical region: Secondary | ICD-10-CM

## 2020-04-25 DIAGNOSIS — R29818 Other symptoms and signs involving the nervous system: Secondary | ICD-10-CM | POA: Diagnosis not present

## 2020-04-25 DIAGNOSIS — I69254 Hemiplegia and hemiparesis following other nontraumatic intracranial hemorrhage affecting left non-dominant side: Secondary | ICD-10-CM | POA: Diagnosis not present

## 2020-04-25 DIAGNOSIS — M6281 Muscle weakness (generalized): Secondary | ICD-10-CM | POA: Diagnosis not present

## 2020-04-25 DIAGNOSIS — M542 Cervicalgia: Secondary | ICD-10-CM

## 2020-04-25 NOTE — Patient Instructions (Signed)
Access Code: [email or web address] URL: [email or web address] Date: 04/25/2020 Prepared by: Willow Ora  Exercises Sit to/from Stand in Stride position - 1 x daily - 5 x weekly - 10 reps - 1 sets Walking March - 1 x daily - 5 x weekly - 3 reps - 1 sets Tandem Walking with Counter Support - 1 x daily - 5 x weekly - 3 reps - 1 sets Standing Gastroc Stretch at Counter - 1 x daily - 5 x weekly - 3 reps - 1 sets - 30 hold Romberg Stance Eyes Closed on Foam Pad - 1 x daily - 5 x weekly - 3 reps - 1 sets - 30 hold Wide Stance with Eyes Closed and Head Rotation on Foam Pad - 1 x daily - 5 x weekly - 10 reps - 1 sets Wide Stance with Eyes Closed and Head Nods on Foam Pad - 1 x daily - 5 x weekly - 10 reps - 1 sets Squat to Heel Raise - 1 x daily - 5 x weekly - 10 reps - 1 sets Single Leg Heel Raise with Unilateral Counter Support - 1 x daily - 5 x weekly - 10 reps - 1 sets Lunge with Counter Support - 1 x daily - 5 x weekly - 10 reps - 1 sets  Added the below ex's today: Supine Chin Tuck - 1 x daily - 5 x weekly - 1 sets - 10 reps - 5 hold Seated Neck Sidebending Stretch - 1 x daily - 5 x weekly - 1 sets - 3 reps - 30 hold

## 2020-04-25 NOTE — Therapy (Signed)
[facility] [address] [place], [place], [address] Phone: [phone]   Fax:  [phone]  Physical Therapy Treatment  Patient Details  Name: Marcus Beasley MRN: [id number] Date of Birth: Jun 28, 1945 Referring Provider (PT): Dr. Delice Lesch   Encounter Date: 04/25/2020   PT End of Session - 04/25/20 0853    Visit Number 2    Number of Visits 5   eval + 4 visits   Date for PT Re-Evaluation 05/18/20    Authorization Type UHC Medicare    Authorization Time Period 04-17-20 - 06-17-20    PT Start Time 0848    PT Stop Time 0930    PT Time Calculation (min) 42 min    Equipment Utilized During Treatment Other (comment)   portable cerivical traction unit   Activity Tolerance Patient tolerated treatment well    Behavior During Therapy [facility] for tasks assessed/performed           Past Medical History:  Diagnosis Date  . BPH (benign prostatic hyperplasia)    (-) Bx 2015  . Diabetes mellitus without complication ([place])   . Elevated PSA    Prostate Bx in 06/2013 was benign  . Glaucoma suspect   . HTN (hypertension)   . Hyperlipidemia   . Macular degeneration, age related     Past Surgical History:  Procedure Laterality Date  . ABCESS DRAINAGE     abdomen- 26 day hospitalization 1968  . COLONOSCOPY  2016  . HERNIA REPAIR  summer '11   umbilical, dr Ninfa Linden   . POLYPECTOMY    . PROSTATE BIOPSY  06-2013 , 07-2017   (-), (-)    There were no vitals filed for this visit.   Subjective Assessment - 04/25/20 0852    Subjective No new complaints. Continues with tingling, numbness and tightness on his left side.    Pertinent History h/o Rt thalamic hemorrhage on 03-12-18:  HTN, PAF, chronic Lt shoulder pain, Type 2 DM, cervical disc disorder with radiculopathy    Patient Stated Goals Get rid of some of the stiffness and tingling in LUE    Currently in Pain? No/denies                 [facility] Adult PT  Treatment/Exercise - 04/25/20 0855      Self-Care   Self-Care Other Self-Care Comments    Other Self-Care Comments  Discussed possible transfer to clinic closer to pt's home for full cervical traction trial as our portable unit was not working correctly. Pt in agreement to this and appt set for our [facility] location.       Exercises   Exercises Other Exercises    Other Exercises  added stretches/gentle ex's to HEP from previous episode of care.      Modalities   Modalities Traction      Traction   Type of Traction Cervical   portable cervical traction machine   Min (lbs) 20    Max (lbs) 30   however did not stay, would loose pull down to 24-26   Hold Time 2   minutes   Rest Time 30   sec's   Time 10                  PT Education - 04/25/20 0952    Education Details updates to HEP from previous episode of care; transition to ortho clinic near home for full cervical traction trial as portable unit at neuro not holding/working  fully.    Person(s) Educated Patient    Methods Explanation;Demonstration;Verbal cues    Comprehension Verbalized understanding;Returned demonstration;Verbal cues required;Need further instruction               PT Long Term Goals - 04/18/20 2101      PT LONG TERM GOAL #1   Title Pt will subjectively report at least 50% improvement in Lt cervical and shoulder tightness with use of cervical traction unit.    Time 4    Period Weeks    Status New    Target Date 05/18/20      PT LONG TERM GOAL #2   Title Improve Lt shoulder active flexion to >/= 95 degrees without c/o pain at end ROM for increased ease with ADL's.    Baseline 85 degrees    Time 4    Period Weeks    Status New    Target Date 05/18/20      PT LONG TERM GOAL #3   Title Independent in HEP for cervical stretching and Lt shoulder AAROM exercises.    Time 4    Period Weeks    Status New    Target Date 05/18/20      PT LONG TERM GOAL #4   Title Refer pt to aquatic therapy  if he wishes to participate in this program after completion of PT.    Time 4    Period Weeks    Status New    Target Date 05/18/20                 Plan - 04/25/20 0853    Clinical Impression Statement Today's skilled session focused on trial of mechanical cervical traction for decreased radicular symptoms. Portable unit unable to maintain hold with limited success. After discussion pt agreeable to transfer to another clinic for continued trial of mechanical cervical traction. Added 2 new ex's/stretches to pt's HEP along with keeping all balance/strengthening ex's from previous episode of care. No issues reported or noted with performance in session today.    Personal Factors and Comorbidities Comorbidity 2;Time since onset of injury/illness/exacerbation    Comorbidities Rt thalamic hemorrhage 03-12-18:  DM, PAF, type 2 DM, cervical disc disorder with radiculopathy    Examination-Activity Limitations Stairs;Transfers;Reach Overhead;Lift;Carry;Locomotion Level    Examination-Participation Restrictions Community Activity;Meal Prep;Shop;Cleaning    Stability/Clinical Decision Making Evolving/Moderate complexity    Rehab Potential Good    PT Frequency 1x / week    PT Duration 4 weeks   plus eval   PT Treatment/Interventions ADLs/Self Care Home Management;Electrical Stimulation;Therapeutic exercise;Therapeutic activities;Neuromuscular re-education;Patient/family education;Manual techniques;Aquatic Therapy;Moist Heat;Other (comment);Passive range of motion   cervical traction   PT Next Visit Plan transfer to [facility] for trial of mechanical cervical traction- can remain there for full POC if found to be beneficial or tranfer back if not.    PT Home Exercise Plan Access Code: [email or web address]    Consulted and Agree with Plan of Care Patient           Patient will benefit from skilled therapeutic intervention in order to improve the following deficits and impairments:  Decreased strength, Impaired  flexibility, Impaired tone, Increased muscle spasms, Impaired UE functional use, Impaired sensation  Visit Diagnosis: Hemiplegia and hemiparesis following other nontraumatic intracranial hemorrhage affecting left non-dominant side (HCC)  Cervicalgia  Radiculopathy, cervical region  Other symptoms and signs involving the nervous system     Problem List Patient Active Problem List   Diagnosis Date Noted  . Type 2  diabetes mellitus without complication, without long-term current use of insulin ([place]) 03/27/2020  . Cervical disc disorder with radiculopathy of cervical region 03/27/2020  . Diabetic polyneuropathy associated with type 2 diabetes mellitus ([place]) 03/27/2020  . Dysesthesia 01/24/2020  . Glaucoma suspect 01/12/2020  . Myalgia 11/29/2019  . Primary osteoarthritis of left shoulder 10/27/2019  . Chronic left shoulder pain 05/12/2019  . Subacromial bursitis of left shoulder joint 09/10/2018  . Spastic hemiplegia affecting nondominant side ([place]) 07/07/2018  . Abnormality of gait 06/09/2018  . Neuropathic pain   . Labile blood pressure   . PAF (paroxysmal atrial fibrillation) ([place])   . Hemiparesis affecting left side as late effect of stroke ([place])   . Thalamic hemorrhage ([place]) 03/17/2018  . Benign essential HTN   . Dyslipidemia   . Hemorrhagic stroke ([place])   . ICH (intracerebral hemorrhage) ([place]) 03/12/2018  . Prostate cancer ([place]) 01/29/2017  . 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 Diabetes ([place]) 12/14/2015  . PCP NOTES >>>>>>>>>>>>>>>>>>>>>>>>>>>>>>. 08/06/2015  . Dizziness and giddiness 01/29/2015  . Umbilical hernia 97/91/5041  . Elevated PSA, less than 10 ng/ml 04/19/2012  . Annual physical exam 01/24/2011  . Hyperlipidemia 01/03/2010  . Essential hypertension 09/20/2007    Willow Ora, PTA, [facility] [address], [place], [place] [address] [phone] 04/25/20, 11:15 AM   Name: Marcus Beasley MRN:  [id number] Date of Birth: 07/26/1945

## 2020-04-26 ENCOUNTER — Ambulatory Visit: Payer: Medicare Other | Admitting: Podiatry

## 2020-04-26 DIAGNOSIS — E1142 Type 2 diabetes mellitus with diabetic polyneuropathy: Secondary | ICD-10-CM | POA: Diagnosis not present

## 2020-04-26 DIAGNOSIS — M79674 Pain in right toe(s): Secondary | ICD-10-CM | POA: Diagnosis not present

## 2020-04-26 DIAGNOSIS — B351 Tinea unguium: Secondary | ICD-10-CM | POA: Diagnosis not present

## 2020-04-26 DIAGNOSIS — E119 Type 2 diabetes mellitus without complications: Secondary | ICD-10-CM

## 2020-04-26 DIAGNOSIS — M79675 Pain in left toe(s): Secondary | ICD-10-CM | POA: Diagnosis not present

## 2020-04-26 DIAGNOSIS — L84 Corns and callosities: Secondary | ICD-10-CM | POA: Diagnosis not present

## 2020-04-26 NOTE — Patient Instructions (Addendum)
Look for urea 40% cream or ointment and apply to the thickened dry skin / calluses. This can be bought over the counter, at a pharmacy or online such as [employer].    Diabetes Mellitus and Foot Care Foot care is an important part of your health, especially when you have diabetes. Diabetes may cause you to have problems because of poor blood flow (circulation) to your feet and legs, which can cause your skin to:  Become thinner and drier.  Break more easily.  Heal more slowly.  Peel and crack. You may also have nerve damage (neuropathy) in your legs and feet, causing decreased feeling in them. This means that you may not notice minor injuries to your feet that could lead to more serious problems. Noticing and addressing any potential problems early is the best way to prevent future foot problems. How to care for your feet Foot hygiene  Wash your feet daily with warm water and mild soap. Do not use hot water. Then, pat your feet and the areas between your toes until they are completely dry. Do not soak your feet as this can dry your skin.  Trim your toenails straight across. Do not dig under them or around the cuticle. File the edges of your nails with an emery board or nail file.  Apply a moisturizing lotion or petroleum jelly to the skin on your feet and to dry, brittle toenails. Use lotion that does not contain alcohol and is unscented. Do not apply lotion between your toes. Shoes and socks  Wear clean socks or stockings every day. Make sure they are not too tight. Do not wear knee-high stockings since they may decrease blood flow to your legs.  Wear shoes that fit properly and have enough cushioning. Always look in your shoes before you put them on to be sure there are no objects inside.  To break in new shoes, wear them for just a few hours a day. This prevents injuries on your feet. Wounds, scrapes, corns, and calluses  Check your feet daily for blisters, cuts, bruises, sores, and  redness. If you cannot see the bottom of your feet, use a mirror or ask someone for help.  Do not cut corns or calluses or try to remove them with medicine.  If you find a minor scrape, cut, or break in the skin on your feet, keep it and the skin around it clean and dry. You may clean these areas with mild soap and water. Do not clean the area with peroxide, alcohol, or iodine.  If you have a wound, scrape, corn, or callus on your foot, look at it several times a day to make sure it is healing and not infected. Check for: ? Redness, swelling, or pain. ? Fluid or blood. ? Warmth. ? Pus or a bad smell. General instructions  Do not cross your legs. This may decrease blood flow to your feet.  Do not use heating pads or hot water bottles on your feet. They may burn your skin. If you have lost feeling in your feet or legs, you may not know this is happening until it is too late.  Protect your feet from hot and cold by wearing shoes, such as at the beach or on hot pavement.  Schedule a complete foot exam at least once a year (annually) or more often if you have foot problems. If you have foot problems, report any cuts, sores, or bruises to your health care provider immediately. Contact a health  care provider if:  You have a medical condition that increases your risk of infection and you have any cuts, sores, or bruises on your feet.  You have an injury that is not healing.  You have redness on your legs or feet.  You feel burning or tingling in your legs or feet.  You have pain or cramps in your legs and feet.  Your legs or feet are numb.  Your feet always feel cold.  You have pain around a toenail. Get help right away if:  You have a wound, scrape, corn, or callus on your foot and: ? You have pain, swelling, or redness that gets worse. ? You have fluid or blood coming from the wound, scrape, corn, or callus. ? Your wound, scrape, corn, or callus feels warm to the touch. ? You  have pus or a bad smell coming from the wound, scrape, corn, or callus. ? You have a fever. ? You have a red line going up your leg. Summary  Check your feet every day for cuts, sores, red spots, swelling, and blisters.  Moisturize feet and legs daily.  Wear shoes that fit properly and have enough cushioning.  If you have foot problems, report any cuts, sores, or bruises to your health care provider immediately.  Schedule a complete foot exam at least once a year (annually) or more often if you have foot problems. This information is not intended to replace advice given to you by your health care provider. Make sure you discuss any questions you have with your health care provider. Document Revised: 03/02/2019 Document Reviewed: 07/11/2016 Elsevier Patient Education  [place].

## 2020-04-29 NOTE — Progress Notes (Signed)
  Subjective:  Patient ID: Marcus Beasley, male    DOB: May 02, 1946,  MRN: [id number]  Chief Complaint  Patient presents with  . routine foot care    PT is a diabetic type 2. He stated that he has a callous on the right hallux and a corn on the left 5th toe that is causing him discomfort     74 y.o. male presents with the above complaint. History confirmed with patient.   Objective:  Physical Exam: warm, good capillary refill, no trophic changes or ulcerative lesions, normal DP and PT pulses and normal sensory exam. Onychomycosis is noted Left Foot:  L dorsal PIPJ corn Right Foot: medial hallux pinch tyloma  Assessment:  No diagnosis found.   Plan:  Patient was evaluated and treated and all questions answered.  Patient educated on diabetes. Discussed proper diabetic foot care and discussed risks and complications of disease. Educated patient in depth on reasons to return to the office immediately should he/she discover anything concerning or new on the feet. All questions answered. Discussed proper shoes as well.   Discussed the etiology and treatment options for the condition in detail with the patient. Educated patient on the topical and oral treatment options for mycotic nails. Recommended debridement of the nails today. Sharp and mechanical debridement performed of all painful and mycotic nails today. Nails debrided in length and thickness using a nail nipper and a mechanical burr to level of comfort. Discussed treatment options including appropriate shoe gear. Follow up as needed for painful nails.  All symptomatic hyperkeratoses were safely debrided with a sterile #15 blade to patient's level of comfort without incident. We discussed preventative and palliative care of these lesions including supportive and accommodative shoegear, padding, prefabricated and custom molded accommodative orthoses, use of a pumice stone and lotions/creams daily.   Return in about 3 months (around  07/27/2020) for [facility].

## 2020-05-01 ENCOUNTER — Other Ambulatory Visit: Payer: Self-pay

## 2020-05-01 ENCOUNTER — Ambulatory Visit: Payer: Medicare Other | Admitting: Physical Therapy

## 2020-05-01 ENCOUNTER — Encounter: Payer: Self-pay | Admitting: Physical Therapy

## 2020-05-01 DIAGNOSIS — M6281 Muscle weakness (generalized): Secondary | ICD-10-CM | POA: Diagnosis not present

## 2020-05-01 DIAGNOSIS — I69254 Hemiplegia and hemiparesis following other nontraumatic intracranial hemorrhage affecting left non-dominant side: Secondary | ICD-10-CM

## 2020-05-01 DIAGNOSIS — M5412 Radiculopathy, cervical region: Secondary | ICD-10-CM | POA: Diagnosis not present

## 2020-05-01 DIAGNOSIS — R29818 Other symptoms and signs involving the nervous system: Secondary | ICD-10-CM | POA: Diagnosis not present

## 2020-05-01 DIAGNOSIS — M542 Cervicalgia: Secondary | ICD-10-CM | POA: Diagnosis not present

## 2020-05-01 NOTE — Therapy (Signed)
[place] [place], [place], [address] Phone: [phone]   Fax:  [phone]  Physical Therapy Treatment  Patient Details  Name: Marcus Beasley MRN: [id number] Date of Birth: [birth year] Referring Provider (PT): Dr. Delice Lesch   Encounter Date: 05/01/2020   PT End of Session - 05/01/20 1008    Visit Number 3    Number of Visits 5    Date for PT Re-Evaluation 05/18/20    Authorization Type UHC Medicare    PT Start Time 0845    PT Stop Time 0927    PT Time Calculation (min) 42 min    Activity Tolerance Patient tolerated treatment well    Behavior During Therapy [facility] for tasks assessed/performed           Past Medical History:  Diagnosis Date  . BPH (benign prostatic hyperplasia)    (-) Bx 2015  . Diabetes mellitus without complication ([place])   . Elevated PSA    Prostate Bx in 06/2013 was benign  . Glaucoma suspect   . HTN (hypertension)   . Hyperlipidemia   . Macular degeneration, age related     Past Surgical History:  Procedure Laterality Date  . ABCESS DRAINAGE     abdomen- 26 day hospitalization 1968  . COLONOSCOPY  2016  . HERNIA REPAIR  summer '11   umbilical, dr Ninfa Linden   . POLYPECTOMY    . PROSTATE BIOPSY  06-2013 , 07-2017   (-), (-)    There were no vitals filed for this visit.   Subjective Assessment - 05/01/20 0848    Subjective Pt reports continuing N/T L UE    Currently in Pain? No/denies                             [facility] Adult PT Treatment/Exercise - 05/01/20 0001      Exercises   Exercises Neck      Neck Exercises: Machines for Strengthening   UBE (Upper Arm Bike) L3 3 min fwd/3 min bkwd      Neck Exercises: Seated   Other Seated Exercise seated rows and shoulder extension 2x10 red TB      Neck Exercises: Supine   Neck Retraction 10 reps;3 secs    Neck Retraction Limitations verbal and tactile cuing for form    Other Supine Exercise chest press  with flexion and OP 1# weight bar      Traction   Type of Traction Cervical    Min (lbs) 15    Max (lbs) 25    Time 15      Neck Exercises: Stretches   Upper Trapezius Stretch Right;Left;1 rep;20 seconds    Levator Stretch Right;Left;1 rep;20 seconds                       PT Long Term Goals - 04/18/20 2101      PT LONG TERM GOAL #1   Title Pt will subjectively report at least 50% improvement in Lt cervical and shoulder tightness with use of cervical traction unit.    Time 4    Period Weeks    Status New    Target Date 05/18/20      PT LONG TERM GOAL #2   Title Improve Lt shoulder active flexion to >/= 95 degrees without c/o pain at end ROM for increased ease with ADL's.    Baseline 85 degrees  Time 4    Period Weeks    Status New    Target Date 05/18/20      PT LONG TERM GOAL #3   Title Independent in HEP for cervical stretching and Lt shoulder AAROM exercises.    Time 4    Period Weeks    Status New    Target Date 05/18/20      PT LONG TERM GOAL #4   Title Refer pt to aquatic therapy if he wishes to participate in this program after completion of PT.    Time 4    Period Weeks    Status New    Target Date 05/18/20                 Plan - 05/01/20 1008    Clinical Impression Statement Pt reports no change in radicular symptoms with cervical traction unit; assess response next rx and trial again if indicated. Pt demos limited L shoulder ROM actively; improved with OP and shoulder flexion with weight bar. Cuing for posture with seated rows and shoulder extension. Pt expressed wanting to continue PT at [facility] since it is closer to his home.    PT Treatment/Interventions ADLs/Self Care Home Management;Electrical Stimulation;Therapeutic exercise;Therapeutic activities;Neuromuscular re-education;Patient/family education;Manual techniques;Aquatic Therapy;Moist Heat;Other (comment);Passive range of motion    PT Next Visit Plan cervical  ROM/flexibility, traction if indicated    Consulted and Agree with Plan of Care Patient           Patient will benefit from skilled therapeutic intervention in order to improve the following deficits and impairments:  Decreased strength, Impaired flexibility, Impaired tone, Increased muscle spasms, Impaired UE functional use, Impaired sensation  Visit Diagnosis: 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 Type 2 diabetes mellitus without complication, without long-term current use of insulin ([place]) 03/27/2020  . Cervical disc disorder with radiculopathy of cervical region 03/27/2020  . Diabetic polyneuropathy associated with type 2 diabetes mellitus ([place]) 03/27/2020  . Dysesthesia 01/24/2020  . Glaucoma suspect 01/12/2020  . Myalgia 11/29/2019  . Primary osteoarthritis of left shoulder 10/27/2019  . Chronic left shoulder pain 05/12/2019  . Subacromial bursitis of left shoulder joint 09/10/2018  . Spastic hemiplegia affecting nondominant side ([place]) 07/07/2018  . Abnormality of gait 06/09/2018  . Neuropathic pain   . Labile blood pressure   . PAF (paroxysmal atrial fibrillation) ([place])   . Hemiparesis affecting left side as late effect of stroke ([place])   . 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 Benign essential HTN   . Dyslipidemia   . Hemorrhagic stroke ([place])   . ICH (intracerebral hemorrhage) ([place]) 03/12/2018  . Prostate cancer ([place]) 01/29/2017  . 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 Diabetes ([place]) 12/14/2015  . PCP NOTES >>>>>>>>>>>>>>>>>>>>>>>>>>>>>>. 08/06/2015  . Dizziness and giddiness 01/29/2015  . Umbilical hernia 16/03/9603  . Elevated PSA, less than 10 ng/ml 04/19/2012  . Annual physical exam 01/24/2011  . Hyperlipidemia  01/03/2010  . Essential hypertension 09/20/2007   Amador Cunas, PT, DPT Donald Prose Donevin Sainsbury 05/01/2020, 10:14 AM  [place]. [place], [place], [address] Phone: [phone]   Fax:  [phone]  Name: ASEEL UHDE MRN: [id number] Date of Birth: 1945/10/23

## 2020-05-02 ENCOUNTER — Ambulatory Visit: Payer: Medicare Other | Admitting: Physical Therapy

## 2020-05-03 ENCOUNTER — Telehealth: Payer: Self-pay

## 2020-05-03 NOTE — Telephone Encounter (Signed)
Spoke to patient advised Patient assistance approved for Eliquis until 06/22/20.Advised I will mail you another patient assistance form to complete for 2022.

## 2020-05-08 ENCOUNTER — Encounter: Payer: Self-pay | Admitting: Physical Therapy

## 2020-05-08 ENCOUNTER — Ambulatory Visit: Payer: Medicare Other | Admitting: Physical Therapy

## 2020-05-08 ENCOUNTER — Other Ambulatory Visit: Payer: Self-pay

## 2020-05-08 DIAGNOSIS — I69254 Hemiplegia and hemiparesis following other nontraumatic intracranial hemorrhage affecting left non-dominant side: Secondary | ICD-10-CM

## 2020-05-08 DIAGNOSIS — R29818 Other symptoms and signs involving the nervous system: Secondary | ICD-10-CM | POA: Diagnosis not present

## 2020-05-08 DIAGNOSIS — M542 Cervicalgia: Secondary | ICD-10-CM

## 2020-05-08 DIAGNOSIS — M5412 Radiculopathy, cervical region: Secondary | ICD-10-CM | POA: Diagnosis not present

## 2020-05-08 DIAGNOSIS — M6281 Muscle weakness (generalized): Secondary | ICD-10-CM

## 2020-05-08 NOTE — Therapy (Signed)
[place]. [place], [place], [address] Phone: [phone]   Fax:  [phone]  Physical Therapy Treatment  Patient Details  Name: Marcus Beasley MRN: [id number] Date of Birth: 08-Feb-1946 Referring Provider (PT): Dr. Delice Lesch   Encounter Date: 05/08/2020   PT End of Session - 05/08/20 0834    Visit Number 4    Date for PT Re-Evaluation 05/18/20    Authorization Type UHC Medicare    PT Start Time 0757    PT Stop Time 0847    PT Time Calculation (min) 50 min    Activity Tolerance Patient tolerated treatment well    Behavior During Therapy [facility] for tasks assessed/performed           Past Medical History:  Diagnosis Date  . BPH (benign prostatic hyperplasia)    (-) Bx 2015  . Diabetes mellitus without complication ([place])   . Elevated PSA    Prostate Bx in 06/2013 was benign  . Glaucoma suspect   . HTN (hypertension)   . Hyperlipidemia   . Macular degeneration, age related     Past Surgical History:  Procedure Laterality Date  . ABCESS DRAINAGE     abdomen- 26 day hospitalization 1968  . COLONOSCOPY  2016  . HERNIA REPAIR  summer '11   umbilical, dr Ninfa Linden   . POLYPECTOMY    . PROSTATE BIOPSY  06-2013 , 07-2017   (-), (-)    There were no vitals filed for this visit.   Subjective Assessment - 05/08/20 0757    Subjective N/T remains, Could not tell the difference with traction    Currently in Pain? No/denies                             [facility] Adult PT Treatment/Exercise - 05/08/20 0001      Exercises   Exercises Neck      Neck Exercises: Machines for Strengthening   UBE (Upper Arm Bike) L1 3 min fwd/3 min bkwd    Cybex Row 20lb 2x10     Lat Pull 20lb 2x10      Neck Exercises: Standing   Other Standing Exercises shrugs 4lb 2x10     Other Standing Exercises W backs x 10 x5 with over pressure      Traction   Type of Traction Cervical    Max (lbs) 15    Hold Time 12     Time 12      Manual Therapy   Manual Therapy Passive ROM    Passive ROM LUE all directions                       PT Long Term Goals - 04/18/20 2101      PT LONG TERM GOAL #1   Title Pt will subjectively report at least 50% improvement in Lt cervical and shoulder tightness with use of cervical traction unit.    Time 4    Period Weeks    Status New    Target Date 05/18/20      PT LONG TERM GOAL #2   Title Improve Lt shoulder active flexion to >/= 95 degrees without c/o pain at end ROM for increased ease with ADL's.    Baseline 85 degrees    Time 4    Period Weeks    Status New    Target Date 05/18/20  PT LONG TERM GOAL #3   Title Independent in HEP for cervical stretching and Lt shoulder AAROM exercises.    Time 4    Period Weeks    Status New    Target Date 05/18/20      PT LONG TERM GOAL #4   Title Refer pt to aquatic therapy if he wishes to participate in this program after completion of PT.    Time 4    Period Weeks    Status New    Target Date 05/18/20                 Plan - 05/08/20 0837    Clinical Impression Statement Pt continues to have limited L shoulder ROM. Overpressure given with W backs to increase stretch. Tactile cues given to pt's trunk to prevent leaning with seated rows. Cues L shoulder muscle density noted with palpation.    Personal Factors and Comorbidities Comorbidity 2;Time since onset of injury/illness/exacerbation    Comorbidities Rt thalamic hemorrhage 03-12-18:  DM, PAF, type 2 DM, cervical disc disorder with radiculopathy    Examination-Activity Limitations Stairs;Transfers;Reach Overhead;Lift;Carry;Locomotion Level    Examination-Participation Restrictions Community Activity;Meal Prep;Shop;Cleaning    Stability/Clinical Decision Making Evolving/Moderate complexity    Rehab Potential Good    PT Frequency 1x / week    PT Treatment/Interventions ADLs/Self Care Home Management;Electrical Stimulation;Therapeutic  exercise;Therapeutic activities;Neuromuscular re-education;Patient/family education;Manual techniques;Aquatic Therapy;Moist Heat;Other (comment);Passive range of motion    PT Next Visit Plan cervical ROM/flexibility, traction if indicated           Patient will benefit from skilled therapeutic intervention in order to improve the following deficits and impairments:  Decreased strength, Impaired flexibility, Impaired tone, Increased muscle spasms, Impaired UE functional use, Impaired sensation  Visit Diagnosis: Cervicalgia  Radiculopathy, cervical region  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 Type 2 diabetes mellitus without complication, without long-term current use of insulin ([place]) 03/27/2020  . Cervical disc disorder with radiculopathy of cervical region 03/27/2020  . Diabetic polyneuropathy associated with type 2 diabetes mellitus ([place]) 03/27/2020  . Dysesthesia 01/24/2020  . Glaucoma suspect 01/12/2020  . Myalgia 11/29/2019  . Primary osteoarthritis of left shoulder 10/27/2019  . Chronic left shoulder pain 05/12/2019  . Subacromial bursitis of left shoulder joint 09/10/2018  . Spastic hemiplegia affecting nondominant side ([place]) 07/07/2018  . Abnormality of gait 06/09/2018  . Neuropathic pain   . Labile blood pressure   . PAF (paroxysmal atrial fibrillation) ([place])   . Hemiparesis affecting left side as late effect of stroke ([place])   . 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 Benign essential HTN   . Dyslipidemia   . Hemorrhagic stroke ([place])   . ICH (intracerebral hemorrhage) ([place]) 03/12/2018  . Prostate cancer ([place]) 01/29/2017  . 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 Diabetes ([place]) 12/14/2015  . PCP NOTES >>>>>>>>>>>>>>>>>>>>>>>>>>>>>>. 08/06/2015  . Dizziness and  giddiness 01/29/2015  . Umbilical hernia 19/62/2297  . Elevated PSA, less than 10 ng/ml 04/19/2012  . Annual physical exam 01/24/2011  . Hyperlipidemia 01/03/2010  . Essential hypertension 09/20/2007    Scot Jun, PTA 05/08/2020, 8:39 AM  [place]. [place], [place], [address] Phone: [phone]   Fax:  [phone]  Name: Marcus Beasley MRN: [id number] Date of Birth: 1946/05/19

## 2020-05-10 ENCOUNTER — Ambulatory Visit: Payer: Medicare Other | Admitting: Physical Therapy

## 2020-05-15 ENCOUNTER — Ambulatory Visit: Payer: Medicare Other | Admitting: Physical Therapy

## 2020-05-15 ENCOUNTER — Other Ambulatory Visit: Payer: Self-pay

## 2020-05-15 ENCOUNTER — Encounter: Payer: Self-pay | Admitting: Physical Therapy

## 2020-05-15 DIAGNOSIS — M5412 Radiculopathy, cervical region: Secondary | ICD-10-CM

## 2020-05-15 DIAGNOSIS — M542 Cervicalgia: Secondary | ICD-10-CM

## 2020-05-15 DIAGNOSIS — M6281 Muscle weakness (generalized): Secondary | ICD-10-CM

## 2020-05-15 DIAGNOSIS — I69254 Hemiplegia and hemiparesis following other nontraumatic intracranial hemorrhage affecting left non-dominant side: Secondary | ICD-10-CM | POA: Diagnosis not present

## 2020-05-15 DIAGNOSIS — R29818 Other symptoms and signs involving the nervous system: Secondary | ICD-10-CM | POA: Diagnosis not present

## 2020-05-15 NOTE — Therapy (Signed)
[place]. [place], [place], [address] Phone: [phone]   Fax:  [phone]  Physical Therapy Treatment  Patient Details  Name: Marcus Beasley MRN: [id number] Date of Birth: 1945-10-06 Referring Provider (PT): Dr. Delice Lesch   Encounter Date: 05/15/2020   PT End of Session - 05/15/20 0914    Visit Number 5    Number of Visits 5    Date for PT Re-Evaluation 05/18/20    Authorization Type UHC Medicare    PT Start Time 0845    PT Stop Time 0928    PT Time Calculation (min) 43 min    Activity Tolerance Patient tolerated treatment well    Behavior During Therapy [facility] for tasks assessed/performed           Past Medical History:  Diagnosis Date  . BPH (benign prostatic hyperplasia)    (-) Bx 2015  . Diabetes mellitus without complication ([place])   . Elevated PSA    Prostate Bx in 06/2013 was benign  . Glaucoma suspect   . HTN (hypertension)   . Hyperlipidemia   . Macular degeneration, age related     Past Surgical History:  Procedure Laterality Date  . ABCESS DRAINAGE     abdomen- 26 day hospitalization 1968  . COLONOSCOPY  2016  . HERNIA REPAIR  summer '11   umbilical, dr Ninfa Linden   . POLYPECTOMY    . PROSTATE BIOPSY  06-2013 , 07-2017   (-), (-)    There were no vitals filed for this visit.   Subjective Assessment - 05/15/20 0849    Subjective N/T remains, Could not tell the difference with traction    Currently in Pain? No/denies   states just N/T in LUE                            [facility] Adult PT Treatment/Exercise - 05/15/20 0001      Neck Exercises: Machines for Strengthening   UBE (Upper Arm Bike) L2 3 min fwd/3 min bkwd    Cybex Row 20lb 2x10     Cybex Chest Press 5# 2x10    Lat Pull 20lb 2x10      Neck Exercises: Standing   Other Standing Exercises W backs x 10      Neck Exercises: Seated   Neck Retraction 10 reps;3 secs    Neck Retraction Limitations  difficulty with form even with cuing      Traction   Type of Traction Cervical    Max (lbs) 15    Hold Time 12    Time 12      Manual Therapy   Manual Therapy Passive ROM    Passive ROM cervical PROM all directions      Neck Exercises: Stretches   Upper Trapezius Stretch Right;Left;1 rep;20 seconds    Levator Stretch Right;Left;1 rep;20 seconds                       PT Long Term Goals - 05/15/20 0917      PT LONG TERM GOAL #1   Title Pt will subjectively report at least 50% improvement in Lt cervical and shoulder tightness with use of cervical traction unit.    Time 4    Period Weeks    Status On-going      PT LONG TERM GOAL #2   Title Improve Lt shoulder active flexion to >/= 95  degrees without c/o pain at end ROM for increased ease with ADL's.    Baseline 85 degrees    Time 4    Period Weeks    Status On-going      PT LONG TERM GOAL #3   Title Independent in HEP for cervical stretching and Lt shoulder AAROM exercises.    Time 4    Period Weeks    Status Achieved      PT LONG TERM GOAL #4   Title Refer pt to aquatic therapy if he wishes to participate in this program after completion of PT.    Time 4    Period Weeks    Status New                 Plan - 05/15/20 0915    Clinical Impression Statement Pt demos N/T in L UE with all ex's unchanged from previous visits. Cuing for form with neck retraction; very little movement in upper cervical spine and difficulty with form with tactile and verbal cuing. Pt reports mild relief with cervical traction; states relief is short-lived. Trial of traction again, assess response next rx.    PT Treatment/Interventions ADLs/Self Care Home Management;Electrical Stimulation;Therapeutic exercise;Therapeutic activities;Neuromuscular re-education;Patient/family education;Manual techniques;Aquatic Therapy;Moist Heat;Other (comment);Passive range of motion    PT Next Visit Plan cervical ROM/flexibility, traction if  indicated    Consulted and Agree with Plan of Care Patient           Patient will benefit from skilled therapeutic intervention in order to improve the following deficits and impairments:  Decreased strength, Impaired flexibility, Impaired tone, Increased muscle spasms, Impaired UE functional use, Impaired sensation  Visit Diagnosis: Cervicalgia  Radiculopathy, cervical region  Muscle weakness (generalized)     Problem List Patient Active Problem List   Diagnosis Date Noted  . Type 2 diabetes mellitus without complication, without long-term current use of insulin ([place]) 03/27/2020  . Cervical disc disorder with radiculopathy of cervical region 03/27/2020  . Diabetic polyneuropathy associated with type 2 diabetes mellitus ([place]) 03/27/2020  . Dysesthesia 01/24/2020  . Glaucoma suspect 01/12/2020  . Myalgia 11/29/2019  . Primary osteoarthritis of left shoulder 10/27/2019  . Chronic left shoulder pain 05/12/2019  . Subacromial bursitis of left shoulder joint 09/10/2018  . Spastic hemiplegia affecting nondominant side ([place]) 07/07/2018  . Abnormality of gait 06/09/2018  . Neuropathic pain   . Labile blood pressure   . PAF (paroxysmal atrial fibrillation) ([place])   . Hemiparesis affecting left side as late effect of stroke ([place])   . Thalamic hemorrhage ([place]) 03/17/2018  . Benign essential HTN   . Dyslipidemia   . Hemorrhagic stroke ([place])   . ICH (intracerebral hemorrhage) ([place]) 03/12/2018  . Prostate cancer ([place]) 01/29/2017  . 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 Diabetes ([place]) 12/14/2015  . PCP NOTES >>>>>>>>>>>>>>>>>>>>>>>>>>>>>>. 08/06/2015  . Dizziness and giddiness 01/29/2015  . Umbilical hernia 09/32/3557  . Elevated PSA, less than 10 ng/ml 04/19/2012  . Annual physical exam 01/24/2011  . Hyperlipidemia 01/03/2010  . Essential hypertension 09/20/2007   Amador Cunas, PT, DPT [name] 05/15/2020, 9:18 AM  [place]. [place], [place], [address] Phone: [phone]   Fax:  [phone]  Name: Marcus Beasley MRN: [id number] Date of Birth: 1945/11/01

## 2020-05-16 ENCOUNTER — Ambulatory Visit: Payer: Medicare Other | Admitting: Physical Therapy

## 2020-05-23 ENCOUNTER — Encounter: Payer: Medicare Other | Admitting: Physical Therapy

## 2020-05-23 ENCOUNTER — Other Ambulatory Visit: Payer: Self-pay

## 2020-05-23 MED ORDER — APIXABAN 5 MG PO TABS: 5.0000 mg | ORAL_TABLET | Freq: Two times a day (BID) | ORAL | 3 refills | Status: DC

## 2020-05-28 ENCOUNTER — Encounter: Payer: Medicare Other | Admitting: Physical Medicine & Rehabilitation

## 2020-05-30 ENCOUNTER — Ambulatory Visit: Payer: Medicare Other | Admitting: Physical Therapy

## 2020-06-11 ENCOUNTER — Encounter: Payer: Self-pay | Admitting: Physical Medicine & Rehabilitation

## 2020-06-11 ENCOUNTER — Encounter: Payer: Medicare Other | Attending: Physical Medicine & Rehabilitation | Admitting: Physical Medicine & Rehabilitation

## 2020-06-11 ENCOUNTER — Other Ambulatory Visit: Payer: Self-pay | Admitting: Internal Medicine

## 2020-06-11 ENCOUNTER — Other Ambulatory Visit: Payer: Self-pay

## 2020-06-11 VITALS — BP 133/89 | HR 78 | Temp 98.8°F | Ht 71.0 in | Wt 152.0 lb

## 2020-06-11 DIAGNOSIS — R208 Other disturbances of skin sensation: Secondary | ICD-10-CM | POA: Diagnosis not present

## 2020-06-11 DIAGNOSIS — I69354 Hemiplegia and hemiparesis following cerebral infarction affecting left non-dominant side: Secondary | ICD-10-CM

## 2020-06-11 DIAGNOSIS — R269 Unspecified abnormalities of gait and mobility: Secondary | ICD-10-CM

## 2020-06-11 DIAGNOSIS — G8110 Spastic hemiplegia affecting unspecified side: Secondary | ICD-10-CM

## 2020-06-11 NOTE — Progress Notes (Signed)
Subjective:    Patient ID: Marcus Beasley, male    DOB: April 06, 1946, 74 y.o.   MRN: [id number]  HPI:  Right-handed male with history of diabetes mellitus, hypertension presents for follow up for right thalamic hemorrhage, now with spasticity.   Last clinic visit 03/27/20.  Since that time, pt states he stopped going to therapies with some benefit.  Denies falls. Continues to have stiffness in LUE.  Pain Inventory Average Pain 4 Pain Right Now 4 My pain is constant, stabbing, tingling and tightness lower leg.  In the last 24 hours, has pain interfered with the following? General activity 5 Relation with others 5 Enjoyment of life 5 What TIME of day is your pain at its worst? night Sleep (in general) Fair  Pain is worse with: unsure and some activites Pain improves with: injections Relief from Meds: 3  Family History  Problem Relation Age of Onset  . Leukemia Mother   . Heart attack Father        MI age 54  . Heart disease Sister        age 90  . Cancer - Other Sister        type  . Heart disease Brother        age 46  . Kidney disease Brother        HD  . Diabetes Maternal Grandmother   . Prostate cancer Neg Hx   . Colon cancer Neg Hx   . Esophageal cancer Neg Hx   . Rectal cancer Neg Hx   . Stomach cancer Neg Hx    Social History   Socioeconomic History  . Marital status: Married    Spouse name: Not on file  . Number of children: 3  . Years of education: 82  . Highest education level: Not on file  Occupational History  . 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 Smoking status: Never Smoker  . Smokeless tobacco: Never Used  Vaping Use  . Vaping Use: Never used  Substance and Sexual Activity  . Alcohol use: Yes    Comment: occ. beer  . Drug use: No  . Sexual activity: Yes    Partners: Female  Other Topics Concern  . 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  2 dtrs , 1 son - homicide. 5 grandchildren.     Household: pt, wife, daughter and son   Does not drive    Social Determinants of Radio broadcast assistant Strain: Not on file  Food Insecurity: Not on file  Transportation Needs: Not on file  Physical Activity: Not on file  Stress: Not on file  Social Connections: Not on file   Past Surgical History:  Procedure Laterality Date  . ABCESS DRAINAGE     abdomen- 26 day hospitalization 1968  . COLONOSCOPY  2016  . HERNIA REPAIR  summer '11   umbilical, dr Ninfa Linden   . POLYPECTOMY    . PROSTATE BIOPSY  06-2013 , 07-2017   (-), (-)   Past Medical History:  Diagnosis Date  . BPH (benign prostatic hyperplasia)    (-) Bx 2015  . Diabetes mellitus without complication ([place])   . Elevated PSA    Prostate Bx in 06/2013 was benign  . Glaucoma suspect   . HTN (hypertension)   . Hyperlipidemia   . Macular degeneration, age related    BP 133/89  Pulse 78   Temp 98.8 F (37.1 C)   Ht 5\' 11"  (1.803 m)   Wt 152 lb (68.9 kg)   SpO2 95%   BMI 21.20 kg/m   Opioid Risk Score:   Fall Risk Score:  `1  Depression screen PHQ 2/9  Depression screen [facility] 2/9 03/27/2020 02/09/2020 12/19/2019 11/29/2019 11/11/2018 10/06/2018 04/28/2018  Decreased Interest 0 0 0 0 0 0 0  Down, Depressed, Hopeless 0 0 0 0 0 0 0  PHQ - 2 Score 0 0 0 0 0 0 0  Some recent data might be hidden     Review of Systems  Constitutional: Negative.        Appetite poor and feels like he is still losing weight. (has not weighed since last visit)  HENT: Negative.   Eyes: Negative.   Respiratory: Negative.   Cardiovascular: Negative.   Gastrointestinal: Negative.   Endocrine: Negative.   Genitourinary: Negative.        Hesitancy  Musculoskeletal: Positive for arthralgias, gait problem and myalgias.  Skin: Negative.   Allergic/Immunologic: Negative.   Neurological: Positive for weakness and numbness.       Tingling  Hematological: Negative.        On eliquis   Psychiatric/Behavioral: Negative.   All other systems reviewed and are negative.     Objective:   Physical Exam  Constitutional: No distress . Vital signs reviewed. HENT: Normocephalic.  Atraumatic. Eyes: EOMI. No discharge. Cardiovascular: No JVD.   Respiratory: Normal effort.  No stridor.   GI: Non-distended.   Skin: Warm and dry.  Intact. Psych: Normal mood.  Normal behavior. Musc: No pain with ER/IR Left shoulder No pain with shoulder abduction >90 deg Gait: Left hemiparetic Neuro: Alert Motor:  LUE: Shoulder abduction 4/5, elbow flex 4+/5, elbow extension 4-/5, hand grip 4/5 with apraxia, improving LLE: HF, KE, ADF 4+/5, improving Mas: left elbow flexors:1/4, limited due to patient resistance, unchanged    Assessment & Plan:  Right-handed male with history of diabetes mellitus, hypertension presents for follow up for right thalamic hemorrhage, now with some spasticity.   1. Left-sided hemiparesis secondary to right thalamic hemorrhage secondary to hypertensive crisis now with spasticity  Continue therapies  Continue follow up with Neurology  ?benefit with Botox: Left Biceps: 100units Left upper traps: 100units Left Med Pec: 100units             Cervical xray personally reviewed, showing muscle spasms and degenerative changes >C5-6  MRI reviewed with patient, degenerative changes with facet pathology of C7-T1.              Shoulder xray reviewed showing shoulder OA             Sternoclavicular xray reviewed, unremarkable  2. Pain Management:  ?No benefit with Baclofen  No benefit with Elavil, d/ced             No benefit with Lyrica, d/ced  No benefit with Tizanidine, d/ced Tylenol as needed D/ced Tramadol Cont ROM  Recommended stretching  3. Gait abnormality  Continue therapies  Continue cane for safety  4. Post stroke  shoulder pain Subacromial steroid injection under ulatrasoundwith benefit in pain and ROM             Intraarticular steroid injection with benefit in ROM and "stiffness"  5. Myalgia  Minimal benefit with trigger point injections  See #2  6. LUE dysesthesias and tightness  See #1  NCS/EMG showing polyneuropathy, as well as Left C6-T1 radiculopathy,  Right C7-8 radiculopathy

## 2020-06-20 ENCOUNTER — Ambulatory Visit: Payer: Medicare Other | Admitting: Physical Therapy

## 2020-06-20 ENCOUNTER — Telehealth: Payer: Self-pay

## 2020-06-20 NOTE — Telephone Encounter (Signed)
Patient assistance forms for Eliquis completed and faxed to [employer] patient assistance at fax # [phone].

## 2020-06-21 ENCOUNTER — Ambulatory Visit: Payer: Medicare Other | Attending: Physical Medicine & Rehabilitation | Admitting: Physical Therapy

## 2020-06-21 ENCOUNTER — Other Ambulatory Visit: Payer: Self-pay

## 2020-06-21 DIAGNOSIS — M5412 Radiculopathy, cervical region: Secondary | ICD-10-CM | POA: Diagnosis not present

## 2020-06-21 DIAGNOSIS — M542 Cervicalgia: Secondary | ICD-10-CM | POA: Insufficient documentation

## 2020-06-21 NOTE — Patient Instructions (Signed)

## 2020-06-21 NOTE — Therapy (Signed)
[facility] [address] [place], [place], [address] Phone: [phone]   Fax:  [phone]  Physical Therapy Treatment  Patient Details  Name: Marcus Beasley MRN: [id number] Date of Birth: 04/28/1946 Referring Provider (PT): Dr. Maryla Morrow   Encounter Date: 06/21/2020   PT End of Session - 06/21/20 1722    Visit Number 6    Date for PT Re-Evaluation 07/22/20    PT Start Time 1646    PT Stop Time 1735    PT Time Calculation (min) 49 min           Past Medical History:  Diagnosis Date  . BPH (benign prostatic hyperplasia)    (-) Bx 2015  . Diabetes mellitus without complication ([facility])   . Elevated PSA    Prostate Bx in 06/2013 was benign  . Glaucoma suspect   . HTN (hypertension)   . Hyperlipidemia   . Macular degeneration, age related     Past Surgical History:  Procedure Laterality Date  . ABCESS DRAINAGE     abdomen- 26 day hospitalization 1968  . COLONOSCOPY  2016  . HERNIA REPAIR  summer '11   umbilical, dr Magnus Ivan   . POLYPECTOMY    . PROSTATE BIOPSY  06-2013 , 07-2017   (-), (-)    There were no vitals filed for this visit.   Subjective Assessment - 06/21/20 1653    Subjective no changes    Currently in Pain? Yes    Pain Score 4     Pain Location Hand    Pain Orientation Left    Pain Descriptors / Indicators Tingling;Numbness                             [facility] Adult PT Treatment/Exercise - 06/21/20 0001      Modalities   Modalities Electrical Stimulation;Moist Heat      Moist Heat Therapy   Number Minutes Moist Heat 15 Minutes    Moist Heat Location Shoulder;Cervical      Electrical Stimulation   Electrical Stimulation Location Left cerv/UT/Rhom    Electrical Stimulation Action IFC    Electrical Stimulation Parameters sitting    Electrical Stimulation Goals Pain;Tone      Manual Therapy   Manual Therapy Soft tissue mobilization;Passive ROM;Neural Stretch    Manual  therapy comments very tight throughtout cerv/trapa nd rhom L>RT, limited cerv ROM BIL.    Soft tissue mobilization cerv,traps and rhom    Passive ROM cervical PROM all directions    Neural Stretch left UE- positive            Trigger Point Dry Needling - 06/21/20 0001    Consent Given? Yes    Education Handout Provided Yes    Muscles Treated Head and Neck Upper trapezius    Upper Trapezius Response Palpable increased muscle length                     PT Long Term Goals - 06/21/20 1724      PT LONG TERM GOAL #1   Title Pt will subjectively report at least 50% improvement in Lt cervical and shoulder tightness with use of cervical traction unit.    Status On-going      PT LONG TERM GOAL #2   Title Improve Lt shoulder active flexion to >/= 95 degrees without c/o pain at end ROM for increased ease with ADL's.  Status On-going      PT LONG TERM GOAL #3   Title Independent in HEP for cervical stretching and Lt shoulder AAROM exercises.    Status Achieved                 Plan - 06/21/20 1725    Clinical Impression Statement pt has not been seen here for 1 month. arrives today with no changes in N/T. limited goal progress. aggressive cerv and left UE PROM, STW and DN- pt is very tight, positive NT and limited left shld ROM d/t tightness and limitations from stroke. also added estima nd MH to help relax muscles    PT Treatment/Interventions ADLs/Self Care Home Management;Electrical Stimulation;Therapeutic exercise;Therapeutic activities;Neuromuscular re-education;Patient/family education;Manual techniques;Aquatic Therapy;Moist Heat;Other (comment);Passive range of motion    PT Next Visit Plan assess if today sesion helped and progress as tolerated           Patient will benefit from skilled therapeutic intervention in order to improve the following deficits and impairments:  Decreased strength,Impaired flexibility,Impaired tone,Increased muscle spasms,Impaired UE  functional use,Impaired sensation  Visit Diagnosis: Cervicalgia  Radiculopathy, cervical region     Problem List Patient Active Problem List   Diagnosis Date Noted  . Type 2 diabetes mellitus without complication, without long-term current use of insulin ([place]) 03/27/2020  . Cervical disc disorder with radiculopathy of cervical region 03/27/2020  . Diabetic polyneuropathy associated with type 2 diabetes mellitus ([place]) 03/27/2020  . Dysesthesia 01/24/2020  . Glaucoma suspect 01/12/2020  . Myalgia 11/29/2019  . Primary osteoarthritis of left shoulder 10/27/2019  . Chronic left shoulder pain 05/12/2019  . Subacromial bursitis of left shoulder joint 09/10/2018  . Spastic hemiplegia affecting nondominant side ([place]) 07/07/2018  . Abnormality of gait 06/09/2018  . Neuropathic pain   . Labile blood pressure   . PAF (paroxysmal atrial fibrillation) ([place])   . Hemiparesis affecting left side as late effect of stroke ([place])   . 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 Benign essential HTN   . Dyslipidemia   . Hemorrhagic stroke ([place])   . ICH (intracerebral hemorrhage) ([place]) 03/12/2018  . Prostate cancer ([place]) 01/29/2017  . 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 Diabetes ([place]) 12/14/2015  . PCP NOTES >>>>>>>>>>>>>>>>>>>>>>>>>>>>>>. 08/06/2015  . Dizziness and giddiness 01/29/2015  . Umbilical hernia [id number]  . Elevated PSA, less than 10 ng/ml 04/19/2012  . Annual physical exam 01/24/2011  . Hyperlipidemia 01/03/2010  . Essential hypertension 09/20/2007    Sharon Rubis,ANGIE PTA 06/21/2020, 5:29 PM  [place]. [place], [place], [address] Phone: [phone]   Fax:  [phone]  Name: Marcus Beasley MRN: [id number] Date of Birth: July 30, 1945

## 2020-06-25 ENCOUNTER — Other Ambulatory Visit: Payer: Self-pay | Admitting: Internal Medicine

## 2020-06-27 ENCOUNTER — Other Ambulatory Visit: Payer: Self-pay

## 2020-06-27 ENCOUNTER — Encounter: Payer: Self-pay | Admitting: Physical Therapy

## 2020-06-27 ENCOUNTER — Ambulatory Visit: Payer: Medicare Other | Attending: Physical Medicine & Rehabilitation | Admitting: Physical Therapy

## 2020-06-27 DIAGNOSIS — M5412 Radiculopathy, cervical region: Secondary | ICD-10-CM | POA: Insufficient documentation

## 2020-06-27 DIAGNOSIS — M542 Cervicalgia: Secondary | ICD-10-CM | POA: Insufficient documentation

## 2020-06-27 DIAGNOSIS — M6281 Muscle weakness (generalized): Secondary | ICD-10-CM | POA: Diagnosis not present

## 2020-06-27 NOTE — Therapy (Signed)
[facility] [address] [place], [place], [address] Phone: [phone]   Fax:  [phone]  Physical Therapy Treatment  Patient Details  Name: Marcus Beasley MRN: [id number] Date of Birth: 06/09/1946 Referring Provider (PT): Dr. Maryla Morrow   Encounter Date: 06/27/2020   PT End of Session - 06/27/20 1013    Visit Number 7    Date for PT Re-Evaluation 07/22/20    PT Start Time 0916    PT Stop Time 1002    PT Time Calculation (min) 46 min    Activity Tolerance Patient tolerated treatment well    Behavior During Therapy [facility] for tasks assessed/performed           Past Medical History:  Diagnosis Date  . BPH (benign prostatic hyperplasia)    (-) Bx 2015  . Diabetes mellitus without complication ([facility])   . Elevated PSA    Prostate Bx in 06/2013 was benign  . Glaucoma suspect   . HTN (hypertension)   . Hyperlipidemia   . Macular degeneration, age related     Past Surgical History:  Procedure Laterality Date  . ABCESS DRAINAGE     abdomen- 26 day hospitalization 1968  . COLONOSCOPY  2016  . HERNIA REPAIR  summer '11   umbilical, dr Magnus Ivan   . POLYPECTOMY    . PROSTATE BIOPSY  06-2013 , 07-2017   (-), (-)    There were no vitals filed for this visit.   Subjective Assessment - 06/27/20 0924    Subjective Pt reports no pain but also no change in N/T of LUE.    Currently in Pain? No/denies    Pain Score 0-No pain    Pain Location Neck                             [facility] Adult PT Treatment/Exercise - 06/27/20 0001      Neck Exercises: Machines for Strengthening   UBE (Upper Arm Bike) L2 3 min fwd/3 min bkwd    Cybex Row 20# 2x15    Other Machines for Strengthening standing shoulder ext 5# 2x10   machine rather than tb d/t difficulty with gripping theraband with L hand     Neck Exercises: Theraband   Rows 10 reps;Green      Neck Exercises: Seated   Neck Retraction 10 reps;3 secs    Neck  Retraction Limitations difficulty with form even with cuing    Cervical Rotation Both;10 reps   with towel for end range stretch   Other Seated Exercise cervical flexion/extension with towel for self mob      Manual Therapy   Manual Therapy Soft tissue mobilization;Passive ROM    Manual therapy comments very tight throughtout cerv/trapa nd rhom L>RT, limited cerv ROM BIL.    Soft tissue mobilization cerv,traps and rhom    Passive ROM cervical PROM all directions      Neck Exercises: Stretches   Upper Trapezius Stretch Right;Left;1 rep;20 seconds    Levator Stretch Right;Left;1 rep;20 seconds    Neural Stretch median nerve neural stretch; difficulty d/t spasticity in LUE                       PT Long Term Goals - 06/21/20 1724      PT LONG TERM GOAL #1   Title Pt will subjectively report at least 50% improvement in Lt cervical and shoulder tightness with  use of cervical traction unit.    Status On-going      PT LONG TERM GOAL #2   Title Improve Lt shoulder active flexion to >/= 95 degrees without c/o pain at end ROM for increased ease with ADL's.    Status On-going      PT LONG TERM GOAL #3   Title Independent in HEP for cervical stretching and Lt shoulder AAROM exercises.    Status Achieved                 Plan - 06/27/20 1013    Clinical Impression Statement Pt reporting mild relief from pain/tightness with DN; tried again this rx, assess response next rx. Extensive STM, PROM, and manual stretching to LUE and cervical. LUE spasticity is limiting progress with UE/cervical flexibility. Issued updated HEP and will continue to address L shoulder/cervical tightness. Pt demos difficulty with cervical retraction even with cuing, limited upper cervical mobility.    PT Treatment/Interventions ADLs/Self Care Home Management;Electrical Stimulation;Therapeutic exercise;Therapeutic activities;Neuromuscular re-education;Patient/family education;Manual techniques;Aquatic  Therapy;Moist Heat;Other (comment);Passive range of motion    PT Next Visit Plan DN if indicated, extensive stretching/manual tx for cervical/UE ROM/flexibility, postural ex's    Consulted and Agree with Plan of Care Patient           Patient will benefit from skilled therapeutic intervention in order to improve the following deficits and impairments:  Decreased strength,Impaired flexibility,Impaired tone,Increased muscle spasms,Impaired UE functional use,Impaired sensation  Visit Diagnosis: Cervicalgia  Radiculopathy, cervical region  Muscle weakness (generalized)     Problem List Patient Active Problem List   Diagnosis Date Noted  . Type 2 diabetes mellitus without complication, without long-term current use of insulin ([place]) 03/27/2020  . Cervical disc disorder with radiculopathy of cervical region 03/27/2020  . Diabetic polyneuropathy associated with type 2 diabetes mellitus ([place]) 03/27/2020  . Dysesthesia 01/24/2020  . Glaucoma suspect 01/12/2020  . Myalgia 11/29/2019  . Primary osteoarthritis of left shoulder 10/27/2019  . Chronic left shoulder pain 05/12/2019  . Subacromial bursitis of left shoulder joint 09/10/2018  . Spastic hemiplegia affecting nondominant side ([place]) 07/07/2018  . Abnormality of gait 06/09/2018  . Neuropathic pain   . Labile blood pressure   . PAF (paroxysmal atrial fibrillation) ([place])   . Hemiparesis affecting left side as late effect of stroke ([place])   . 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 Benign essential HTN   . Dyslipidemia   . Hemorrhagic stroke ([place])   . ICH (intracerebral hemorrhage) ([place]) 03/12/2018  . Prostate cancer ([place]) 01/29/2017  . 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 Diabetes ([place]) 12/14/2015  . PCP NOTES >>>>>>>>>>>>>>>>>>>>>>>>>>>>>>. 08/06/2015  . Dizziness and giddiness 01/29/2015  . Umbilical hernia [id number]  . Elevated PSA, less than 10 ng/ml 04/19/2012  . Annual physical exam 01/24/2011  .  Hyperlipidemia 01/03/2010  . Essential hypertension 09/20/2007   Amador Cunas, PT, DPT Donald Prose Tavarion [place] 06/27/2020, 10:17 AM  [place]. [place], [place], [address] Phone: [phone]   Fax:  [phone]  Name: JERMANI LATIMER MRN: [id number] Date of Birth: 04-20-1946

## 2020-07-04 ENCOUNTER — Other Ambulatory Visit: Payer: Self-pay

## 2020-07-04 ENCOUNTER — Encounter: Payer: Self-pay | Admitting: Physical Therapy

## 2020-07-04 ENCOUNTER — Ambulatory Visit: Payer: Medicare Other | Admitting: Physical Therapy

## 2020-07-04 DIAGNOSIS — M5412 Radiculopathy, cervical region: Secondary | ICD-10-CM

## 2020-07-04 DIAGNOSIS — M6281 Muscle weakness (generalized): Secondary | ICD-10-CM | POA: Diagnosis not present

## 2020-07-04 DIAGNOSIS — M542 Cervicalgia: Secondary | ICD-10-CM

## 2020-07-04 NOTE — Therapy (Signed)
[place]. [place], [place], [address] Phone: [phone]   Fax:  [phone]  Physical Therapy Treatment  Patient Details  Name: Marcus Beasley MRN: [id number] Date of Birth: 02-03-1946 Referring Provider (PT): Dr. Delice Lesch   Encounter Date: 07/04/2020   PT End of Session - 07/04/20 0959    Visit Number 8    Date for PT Re-Evaluation 07/22/20    PT Start Time 0918    PT Stop Time 1007    PT Time Calculation (min) 49 min    Activity Tolerance Patient tolerated treatment well    Behavior During Therapy [facility] for tasks assessed/performed           Past Medical History:  Diagnosis Date  . BPH (benign prostatic hyperplasia)    (-) Bx 2015  . Diabetes mellitus without complication ([place])   . Elevated PSA    Prostate Bx in 06/2013 was benign  . Glaucoma suspect   . HTN (hypertension)   . Hyperlipidemia   . Macular degeneration, age related     Past Surgical History:  Procedure Laterality Date  . ABCESS DRAINAGE     abdomen- 26 day hospitalization 1968  . COLONOSCOPY  2016  . HERNIA REPAIR  summer '11   umbilical, dr Ninfa Linden   . POLYPECTOMY    . PROSTATE BIOPSY  06-2013 , 07-2017   (-), (-)    There were no vitals filed for this visit.   Subjective Assessment - 07/04/20 0925    Subjective Pt reports no change, still stiff and tight with N/T LUE.    Currently in Pain? No/denies    Pain Score 0-No pain                             [facility] Adult PT Treatment/Exercise - 07/04/20 0001      Neck Exercises: Machines for Strengthening   UBE (Upper Arm Bike) L2 3 min fwd/3 min bkwd    Cybex Row 20# 2x15    Cybex Chest Press 5# 2x15    Lat Pull 20# 2x15    Other Machines for Strengthening standing shoulder ext 5# 2x10      Neck Exercises: Seated   Neck Retraction 10 reps;3 secs    Neck Retraction Limitations difficulty with form even with cuing    Other Seated Exercise L shoulder  flexion/extension/abduction AAROM      Moist Heat Therapy   Number Minutes Moist Heat 10 Minutes    Moist Heat Location Shoulder;Cervical      Electrical Stimulation   Electrical Stimulation Location Left cerv/UT/Rhom    Electrical Stimulation Action IFC    Electrical Stimulation Parameters sitting    Electrical Stimulation Goals Pain;Tone      Manual Therapy   Manual Therapy Soft tissue mobilization;Passive ROM;Joint mobilization    Manual therapy comments very tight throughtout cerv/trapa nd rhom L>RT, limited cerv ROM BIL.    Joint Mobilization PA mobs to cervical spine    Soft tissue mobilization cerv,traps and rhom    Passive ROM cervical PROM all directions                       PT Long Term Goals - 06/21/20 1724      PT LONG TERM GOAL #1   Title Pt will subjectively report at least 50% improvement in Lt cervical and shoulder tightness with use of cervical traction  unit.    Status On-going      PT LONG TERM GOAL #2   Title Improve Lt shoulder active flexion to >/= 95 degrees without c/o pain at end ROM for increased ease with ADL's.    Status On-going      PT LONG TERM GOAL #3   Title Independent in HEP for cervical stretching and Lt shoulder AAROM exercises.    Status Achieved                 Plan - 07/04/20 0959    Clinical Impression Statement Pt reported a little relief in tightness with DN but no change in N/T in L UE. Extensive stretching/manual to LUE and cervical with STM to L UT. Continues to be limited by LUE spasticity. Hypomobility of cervical spine with mobs and pt with difficutly isolating cervical movements with neck retraction. Discussed with pt returning to MD if no change after next two appts with pt VU and agreement. Estim and heat at end of session for relaxation of cervical musculature.    PT Treatment/Interventions ADLs/Self Care Home Management;Electrical Stimulation;Therapeutic exercise;Therapeutic activities;Neuromuscular  re-education;Patient/family education;Manual techniques;Aquatic Therapy;Moist Heat;Other (comment);Passive range of motion    PT Next Visit Plan DN if indicated, extensive stretching/manual tx for cervical/UE ROM/flexibility, postural ex's    Consulted and Agree with Plan of Care Patient           Patient will benefit from skilled therapeutic intervention in order to improve the following deficits and impairments:  Decreased strength,Impaired flexibility,Impaired tone,Increased muscle spasms,Impaired UE functional use,Impaired sensation  Visit Diagnosis: Cervicalgia  Radiculopathy, cervical region     Problem List Patient Active Problem List   Diagnosis Date Noted  . Type 2 diabetes mellitus without complication, without long-term current use of insulin ([place]) 03/27/2020  . Cervical disc disorder with radiculopathy of cervical region 03/27/2020  . Diabetic polyneuropathy associated with type 2 diabetes mellitus ([place]) 03/27/2020  . Dysesthesia 01/24/2020  . Glaucoma suspect 01/12/2020  . Myalgia 11/29/2019  . Primary osteoarthritis of left shoulder 10/27/2019  . Chronic left shoulder pain 05/12/2019  . Subacromial bursitis of left shoulder joint 09/10/2018  . Spastic hemiplegia affecting nondominant side ([place]) 07/07/2018  . Abnormality of gait 06/09/2018  . Neuropathic pain   . Labile blood pressure   . PAF (paroxysmal atrial fibrillation) ([place])   . Hemiparesis affecting left side as late effect of stroke ([place])   . Thalamic hemorrhage ([place]) 03/17/2018  . Benign essential HTN   . Dyslipidemia   . Hemorrhagic stroke ([place])   . ICH (intracerebral hemorrhage) ([place]) 03/12/2018  . Prostate cancer ([place]) 01/29/2017  . 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 Diabetes ([place]) 12/14/2015  . PCP NOTES >>>>>>>>>>>>>>>>>>>>>>>>>>>>>>. 08/06/2015  . Dizziness and giddiness 01/29/2015  . Umbilical hernia 02/72/5366  . Elevated PSA, less than 10 ng/ml 04/19/2012  . Annual  physical exam 01/24/2011  . Hyperlipidemia 01/03/2010  . Essential hypertension 09/20/2007   Amador Cunas, PT, DPT Donald Prose Frederich [place] 07/04/2020, 10:08 AM  [place]. [place], [place], [address] Phone: [phone]   Fax:  [phone]  Name: RUTHERFORD ALARIE MRN: [id number] Date of Birth: [birth year]

## 2020-07-10 ENCOUNTER — Ambulatory Visit: Payer: Medicare Other | Admitting: Internal Medicine

## 2020-07-11 ENCOUNTER — Ambulatory Visit: Payer: Medicare Other | Admitting: Physical Therapy

## 2020-07-15 NOTE — Progress Notes (Signed)
Cardiology Office Note    Date:  07/18/2020   ID:  Marcus, Beasley December 22, 1945, MRN [id number]  PCP:  Colon Branch, MD  Cardiologist:  Dr. Martinique  Chief Complaint  Patient presents with  . Atrial Fibrillation    History of Present Illness:  Marcus Beasley is a 75 y.o. male seen for follow up Afib, HTN and prior hemorrhagic CVA. He has a  past medical history of hypertension, DM 2 and hyperlipidemia who presented on 03/12/2018 was facial droop, slurred speech and left leg weakness.  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  This was attributed to uncontrolled high blood pressure.  During the admission, patient developed paroxysmal atrial fibrillation and was placed on 180 mg daily of diltiazem. [place] Vasc score of 5. He was not placed on systemic anticoagulation initially due to the brain bleed. Later CT showed resolution of hemorrhage and that it would be safe to resume anticoagulation.   On follow up today he still notes stiffness on his left side. He is still doing some PT exercises.  No chest pain, palpitations, dizziness, edema, SOB. No bleeding.   Past Medical History:  Diagnosis Date  . BPH (benign prostatic hyperplasia)    (-) Bx 2015  . Diabetes mellitus without complication ([place])   . Elevated PSA    Prostate Bx in 06/2013 was benign  . Glaucoma suspect   . HTN (hypertension)   . Hyperlipidemia   . Macular degeneration, age related     Past Surgical History:  Procedure Laterality Date  . ABCESS DRAINAGE     abdomen- 26 day hospitalization 1968  . COLONOSCOPY  2016  . HERNIA REPAIR  summer '11   umbilical, dr Ninfa Linden   . POLYPECTOMY    . PROSTATE BIOPSY  06-2013 , 07-2017   (-), (-)    Current Medications: Outpatient Medications Prior to Visit  Medication Sig Dispense Refill  . amitriptyline (ELAVIL) 25 MG tablet Take 25 mg by mouth at bedtime.    Marland Kitchen amLODipine (NORVASC) 5 MG tablet Take 1 tablet (5 mg total) by mouth daily. 90 tablet 1   . apixaban (ELIQUIS) 5 MG TABS tablet Take 1 tablet (5 mg total) by mouth 2 (two) times daily. 180 tablet 3  . atorvastatin (LIPITOR) 40 MG tablet Take 1 tablet (40 mg total) by mouth daily. Please make annual appt with Dr. Martinique for refills. [phone]. 1st attempt. 90 tablet 3  . diltiazem (CARDIZEM CD) 240 MG 24 hr capsule TAKE 1 CAPSULE BY MOUTH EVERY DAY 90 capsule 3  . finasteride (PROSCAR) 5 MG tablet Take 5 mg by mouth daily.    . meclizine (ANTIVERT) 25 MG tablet Take 1 tablet (25 mg total) by mouth 2 (two) times daily as needed for dizziness. 30 tablet 1  . metFORMIN (GLUCOPHAGE) 850 MG tablet TAKE 1 TABLET (850 MG TOTAL) BY MOUTH 2 (TWO) TIMES DAILY WITH A MEAL. 180 tablet 1  . metoprolol tartrate (LOPRESSOR) 50 MG tablet Take 1 tablet (50 mg total) by mouth 2 (two) times daily. 180 tablet 3  . Multiple Vitamin (MULTIVITAMIN WITH MINERALS) TABS tablet Take 1 tablet by mouth daily.     . Multiple Vitamins-Minerals (PRESERVISION/LUTEIN PO) Take 1 tablet by mouth daily.     Glory Rosebush VERIO test strip CHECK BLOOD SUGAR NO MORE THAN TWICE DAILY 200 strip 12  . RESTASIS 0.05 % ophthalmic emulsion 1 drop 2 (two) times daily.    . tamsulosin (  FLOMAX) 0.4 MG CAPS capsule Take 0.4 mg by mouth daily.     No facility-administered medications prior to visit.     Allergies:   Patient has no known allergies.   Social History   Socioeconomic History  . Marital status: Married    Spouse name: Not on file  . Number of children: 3  . Years of education: 25  . Highest education level: Not on file  Occupational History  . 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 Smoking status: Never Smoker  . Smokeless tobacco: Never Used  Vaping Use  . Vaping Use: Never used  Substance and Sexual Activity  . Alcohol use: Yes    Comment: occ. beer  . Drug use: No  . Sexual activity: Yes    Partners: Female  Other Topics Concern  . 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 2 dtrs , 1 son - homicide. 5 grandchildren.     Household: pt, wife, daughter and son   Does not drive    Social Determinants of Corporate investment banker Strain: Not on file  Food Insecurity: Not on file  Transportation Needs: Not on file  Physical Activity: Not on file  Stress: Not on file  Social Connections: Not on file     Family History:  The patient's family history includes Cancer - Other in his sister; Diabetes in his maternal grandmother; Heart attack in his father; Heart disease in his brother and sister; Kidney disease in his brother; Leukemia in his mother.   ROS:   Please see the history of present illness.    ROS All other systems reviewed and are negative.   PHYSICAL EXAM:   VS:  BP 120/79   Pulse 69   Ht 5\' 11"  (1.803 m)   Wt 150 lb 6.4 oz (68.2 kg)   SpO2 99%   BMI 20.98 kg/m    GENERAL:  Well appearing BM in NAD HEENT:  PERRL, EOMI, sclera are clear. Oropharynx is clear. NECK:  No jugular venous distention, carotid upstroke brisk and symmetric, no bruits, no thyromegaly or adenopathy LUNGS:  Clear to auscultation bilaterally CHEST:  Unremarkable HEART:  RRR,  PMI not displaced or sustained,S1 and S2 within normal limits, no S3, no S4: no clicks, no rubs, no murmurs ABD:  Soft, nontender. BS +, no masses or bruits. No hepatomegaly, no splenomegaly EXT:  2 + pulses throughout, no edema, no cyanosis no clubbing SKIN:  Warm and dry.  No rashes NEURO:  Alert and oriented x 3. Cranial nerves II through XII intact. Some left sided weakness persists. PSYCH:  Cognitively intact    Wt Readings from Last 3 Encounters:  07/18/20 150 lb 6.4 oz (68.2 kg)  06/11/20 152 lb (68.9 kg)  03/27/20 160 lb (72.6 kg)      Studies/Labs Reviewed:   EKG:  EKG is not ordered today.    Recent Labs: 03/08/2020: BUN 15; Creat 0.90; Hemoglobin 13.1; Platelets 297; Potassium 4.8; Sodium 140; TSH 0.77    Lipid Panel    Component Value Date/Time   CHOL 125 06/02/2019 1036   CHOL 141 01/20/2019 0922   TRIG 66.0 06/02/2019 1036   HDL 43.20 06/02/2019 1036   HDL 44 01/20/2019 0922   CHOLHDL 3 06/02/2019 1036   VLDL 13.2 06/02/2019 1036   LDLCALC 69 06/02/2019 1036   LDLCALC 85 01/20/2019 0922  Additional studies/ records that were reviewed today include:   Echo 03/13/2018 LV EF: 65% -   70% Study Conclusions  - Left ventricle: The cavity size was normal. Wall thickness was   increased in a pattern of mild LVH. Systolic function was   vigorous. The estimated ejection fraction was in the range of 65%   to 70%. Wall motion was normal; there were no regional wall   motion abnormalities. - Aortic valve: There was trivial regurgitation.    ASSESSMENT:    No diagnosis found.   PLAN:  In order of problems listed above:  1. PAF: continue Eliquis 5 mg bid long term. Continue BP control. Asymptomatic.   2. History of CVA: s/p hemorrhagic stroke involving right thalamus. Some residual left upper extremity stiffness but overall good recovery. Continue Eliquis  3. Hypertension: Blood pressure is well controlled.   4. Hyperlipidemia: last LDL 69. Goal < 70. Continue lipitor 40 mg. Labs followed by Dr Larose Kells  5. DM2: Managed by primary care provider.  On metformin. A1c 6.1%.   I will see in one year   Medication Adjustments/Labs and Tests Ordered: Current medicines are reviewed at length with the patient today.  Concerns regarding medicines are outlined above.  Medication changes, Labs and Tests ordered today are listed in the Patient Instructions below. There are no Patient Instructions on file for this visit.   Signed, Syre Knerr Martinique, MD  07/18/2020 11:16 AM    [facility] [place], [place],   [address] Phone: [phone]; Fax: [phone]

## 2020-07-18 ENCOUNTER — Ambulatory Visit: Payer: Medicare Other | Admitting: Cardiology

## 2020-07-18 ENCOUNTER — Encounter: Payer: Self-pay | Admitting: Cardiology

## 2020-07-18 ENCOUNTER — Other Ambulatory Visit: Payer: Self-pay

## 2020-07-18 VITALS — BP 120/79 | HR 69 | Ht 71.0 in | Wt 150.4 lb

## 2020-07-18 DIAGNOSIS — I619 Nontraumatic intracerebral hemorrhage, unspecified: Secondary | ICD-10-CM

## 2020-07-18 DIAGNOSIS — E785 Hyperlipidemia, unspecified: Secondary | ICD-10-CM

## 2020-07-18 DIAGNOSIS — I480 Paroxysmal atrial fibrillation: Secondary | ICD-10-CM

## 2020-07-18 DIAGNOSIS — Z7901 Long term (current) use of anticoagulants: Secondary | ICD-10-CM | POA: Diagnosis not present

## 2020-07-18 DIAGNOSIS — I10 Essential (primary) hypertension: Secondary | ICD-10-CM | POA: Diagnosis not present

## 2020-07-19 ENCOUNTER — Ambulatory Visit: Payer: Medicare Other | Admitting: Physical Therapy

## 2020-07-19 ENCOUNTER — Encounter: Payer: Self-pay | Admitting: Physical Therapy

## 2020-07-19 DIAGNOSIS — M6281 Muscle weakness (generalized): Secondary | ICD-10-CM

## 2020-07-19 DIAGNOSIS — M542 Cervicalgia: Secondary | ICD-10-CM

## 2020-07-19 DIAGNOSIS — M5412 Radiculopathy, cervical region: Secondary | ICD-10-CM

## 2020-07-19 NOTE — Therapy (Signed)
[place] [place], [place], [address] Phone: [phone]   Fax:  [phone]  Physical Therapy Treatment  Patient Details  Name: Marcus Beasley MRN: [id number] Date of Birth: 1945-12-04 Referring Provider (PT): Dr. Delice Lesch   Encounter Date: 07/19/2020   PT End of Session - 07/19/20 1013    Visit Number 9    Date for PT Re-Evaluation 07/22/20    Authorization Type UHC Medicare    PT Start Time 0930    PT Stop Time 1014    PT Time Calculation (min) 44 min    Activity Tolerance Patient tolerated treatment well    Behavior During Therapy [facility] for tasks assessed/performed           Past Medical History:  Diagnosis Date  . BPH (benign prostatic hyperplasia)    (-) Bx 2015  . Diabetes mellitus without complication ([place])   . Elevated PSA    Prostate Bx in 06/2013 was benign  . Glaucoma suspect   . HTN (hypertension)   . Hyperlipidemia   . Macular degeneration, age related     Past Surgical History:  Procedure Laterality Date  . ABCESS DRAINAGE     abdomen- 26 day hospitalization 1968  . COLONOSCOPY  2016  . HERNIA REPAIR  summer '11   umbilical, dr Ninfa Linden   . POLYPECTOMY    . PROSTATE BIOPSY  06-2013 , 07-2017   (-), (-)    There were no vitals filed for this visit.   Subjective Assessment - 07/19/20 0929    Subjective N/T, tightness and stiffness remains in LUE    Currently in Pain? No/denies                             [facility] Adult PT Treatment/Exercise - 07/19/20 0001      Neck Exercises: Machines for Strengthening   Nustep L5 x 6 min    Cybex Row 25lb 2x10    Cybex Chest Press 10lb 2x10    Lat Pull 25lb 2x10      Neck Exercises: Standing   Upper Extremity D2 Extension;10 reps;Weights;Other (comment)    UE D2 Weights (lbs) 1    Other Standing Exercises Shoulder flex & abd 1lb 2x10 each      Manual Therapy   Manual Therapy Soft tissue mobilization;Passive  ROM;Joint mobilization    Manual therapy comments very guarded, difficulty relaxing    Passive ROM LUE in all directions          Velcro board key LUE              PT Long Term Goals - 06/21/20 1724      PT LONG TERM GOAL #1   Title Pt will subjectively report at least 50% improvement in Lt cervical and shoulder tightness with use of cervical traction unit.    Status On-going      PT LONG TERM GOAL #2   Title Improve Lt shoulder active flexion to >/= 95 degrees without c/o pain at end ROM for increased ease with ADL's.    Status On-going      PT LONG TERM GOAL #3   Title Independent in HEP for cervical stretching and Lt shoulder AAROM exercises.    Status Achieved                 Plan - 07/19/20 1015    Clinical Impression Statement N/T remains  in LUE as well as some tightness. He did well with the added resistance on machine level interventions. Added Velcro board but had difficulty gripping rolling pin with his L hand. Ataxic movement noted on Velcro board with key. Pt had difficulty relaxing with MT, ataxic movement with L arm elbow and hand.    Personal Factors and Comorbidities Comorbidity 2;Time since onset of injury/illness/exacerbation    Comorbidities Rt thalamic hemorrhage 03-12-18:  DM, PAF, type 2 DM, cervical disc disorder with radiculopathy    Examination-Activity Limitations Stairs;Transfers;Reach Overhead;Lift;Carry;Locomotion Level    Stability/Clinical Decision Making Evolving/Moderate complexity    Rehab Potential Good    PT Frequency 1x / week    PT Duration 4 weeks    PT Treatment/Interventions ADLs/Self Care Home Management;Electrical Stimulation;Therapeutic exercise;Therapeutic activities;Neuromuscular re-education;Patient/family education;Manual techniques;Aquatic Therapy;Moist Heat;Other (comment);Passive range of motion    PT Next Visit Plan DN if indicated, extensive stretching/manual tx for cervical/UE ROM/flexibility, postural ex's            Patient will benefit from skilled therapeutic intervention in order to improve the following deficits and impairments:  Decreased strength,Impaired flexibility,Impaired tone,Increased muscle spasms,Impaired UE functional use,Impaired sensation  Visit Diagnosis: Radiculopathy, cervical region  Cervicalgia  Muscle weakness (generalized)     Problem List Patient Active Problem List   Diagnosis Date Noted  . Type 2 diabetes mellitus without complication, without long-term current use of insulin ([place]) 03/27/2020  . Cervical disc disorder with radiculopathy of cervical region 03/27/2020  . Diabetic polyneuropathy associated with type 2 diabetes mellitus ([place]) 03/27/2020  . Dysesthesia 01/24/2020  . Glaucoma suspect 01/12/2020  . Myalgia 11/29/2019  . Primary osteoarthritis of left shoulder 10/27/2019  . Chronic left shoulder pain 05/12/2019  . Subacromial bursitis of left shoulder joint 09/10/2018  . Spastic hemiplegia affecting nondominant side ([place]) 07/07/2018  . Abnormality of gait 06/09/2018  . Neuropathic pain   . Labile blood pressure   . PAF (paroxysmal atrial fibrillation) ([place])   . Hemiparesis affecting left side as late effect of stroke ([place])   . Thalamic hemorrhage ([place]) 03/17/2018  . Benign essential HTN   . Dyslipidemia   . Hemorrhagic stroke ([place])   . ICH (intracerebral hemorrhage) ([place]) 03/12/2018  . Prostate cancer ([place]) 01/29/2017  . 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 Diabetes ([place]) 12/14/2015  . PCP NOTES >>>>>>>>>>>>>>>>>>>>>>>>>>>>>>. 08/06/2015  . Dizziness and giddiness 01/29/2015  . Umbilical hernia 14/48/1856  . Elevated PSA, less than 10 ng/ml 04/19/2012  . Annual physical exam 01/24/2011  . Hyperlipidemia 01/03/2010  . Essential hypertension 09/20/2007    Scot Jun, PTA 07/19/2020, 10:19 AM  [place]. [place], [place], [address] Phone:  [phone]   Fax:  [phone]  Name: Marcus Beasley MRN: [id number] Date of Birth: 10/23/1945

## 2020-07-20 ENCOUNTER — Ambulatory Visit (INDEPENDENT_AMBULATORY_CARE_PROVIDER_SITE_OTHER): Payer: Medicare Other | Admitting: Internal Medicine

## 2020-07-20 ENCOUNTER — Encounter: Payer: Self-pay | Admitting: Internal Medicine

## 2020-07-20 ENCOUNTER — Other Ambulatory Visit: Payer: Self-pay

## 2020-07-20 VITALS — BP 120/82 | HR 65 | Temp 98.3°F | Resp 18 | Ht 71.0 in | Wt 151.2 lb

## 2020-07-20 DIAGNOSIS — Z0000 Encounter for general adult medical examination without abnormal findings: Secondary | ICD-10-CM

## 2020-07-20 DIAGNOSIS — Z0001 Encounter for general adult medical examination with abnormal findings: Secondary | ICD-10-CM

## 2020-07-20 DIAGNOSIS — Z23 Encounter for immunization: Secondary | ICD-10-CM | POA: Diagnosis not present

## 2020-07-20 DIAGNOSIS — I6930 Unspecified sequelae of cerebral infarction: Secondary | ICD-10-CM

## 2020-07-20 DIAGNOSIS — E785 Hyperlipidemia, unspecified: Secondary | ICD-10-CM | POA: Diagnosis not present

## 2020-07-20 DIAGNOSIS — I10 Essential (primary) hypertension: Secondary | ICD-10-CM

## 2020-07-20 DIAGNOSIS — R634 Abnormal weight loss: Secondary | ICD-10-CM | POA: Diagnosis not present

## 2020-07-20 DIAGNOSIS — E119 Type 2 diabetes mellitus without complications: Secondary | ICD-10-CM | POA: Diagnosis not present

## 2020-07-20 LAB — LIPID PANEL
Cholesterol: 144 mg/dL (ref 0–200)
HDL: 47.3 mg/dL (ref >39.00)
LDL Cholesterol: 85 mg/dL (ref 0–99)
NonHDL: 96.56
Total CHOL/HDL Ratio: 3
Triglycerides: 58 mg/dL (ref 0.0–149.0)
VLDL: 11.6 mg/dL (ref 0.0–40.0)

## 2020-07-20 LAB — CBC WITH DIFFERENTIAL/PLATELET
Basophils Absolute: 0 10*3/uL (ref 0.0–0.1)
Basophils Relative: 0.5 % (ref 0.0–3.0)
Eosinophils Absolute: 0.1 10*3/uL (ref 0.0–0.7)
Eosinophils Relative: 1.3 % (ref 0.0–5.0)
HCT: 38.5 % — ABNORMAL LOW (ref 39.0–52.0)
Hemoglobin: 12.6 g/dL — ABNORMAL LOW (ref 13.0–17.0)
Lymphocytes Relative: 33.8 % (ref 12.0–46.0)
Lymphs Abs: 1.4 10*3/uL (ref 0.7–4.0)
MCHC: 32.7 g/dL (ref 30.0–36.0)
MCV: 88.2 fl (ref 78.0–100.0)
Monocytes Absolute: 0.4 10*3/uL (ref 0.1–1.0)
Monocytes Relative: 10.3 % (ref 3.0–12.0)
Neutro Abs: 2.3 10*3/uL (ref 1.4–7.7)
Neutrophils Relative %: 54.1 % (ref 43.0–77.0)
Platelets: 249 10*3/uL (ref 150.0–400.0)
RBC: 4.36 Mil/uL (ref 4.22–5.81)
RDW: 14.1 % (ref 11.5–15.5)
WBC: 4.2 10*3/uL (ref 4.0–10.5)

## 2020-07-20 LAB — COMPREHENSIVE METABOLIC PANEL
ALT: 11 U/L (ref 0–53)
AST: 13 U/L (ref 0–37)
Albumin: 4.4 g/dL (ref 3.5–5.2)
Alkaline Phosphatase: 69 U/L (ref 39–117)
BUN: 14 mg/dL (ref 6–23)
CO2: 29 mEq/L (ref 19–32)
Calcium: 9.5 mg/dL (ref 8.4–10.5)
Chloride: 104 mEq/L (ref 96–112)
Creatinine, Ser: 0.79 mg/dL (ref 0.40–1.50)
GFR: 86.65 mL/min (ref >60.00)
Glucose, Bld: 85 mg/dL (ref 70–99)
Potassium: 4.4 mEq/L (ref 3.5–5.1)
Sodium: 139 mEq/L (ref 135–145)
Total Bilirubin: 0.6 mg/dL (ref 0.2–1.2)
Total Protein: 6.8 g/dL (ref 6.0–8.3)

## 2020-07-20 LAB — HEMOGLOBIN A1C: Hgb A1c MFr Bld: 5.8 % (ref 4.6–6.5)

## 2020-07-20 LAB — TSH: TSH: 0.76 u[IU]/mL (ref 0.35–4.50)

## 2020-07-20 NOTE — Progress Notes (Signed)
Pre visit review using our clinic review tool, if applicable. No additional management support is needed unless otherwise documented below in the visit note. 

## 2020-07-20 NOTE — Patient Instructions (Addendum)
Per our records you are due for an eye exam. Please contact your eye doctor to schedule an appointment. Please have them send copies of your office visit notes to [place]. Our fax number is [phone].    GO TO THE LAB : Get the blood work     Marcus Beasley, [place] back for a checkup in 4 months

## 2020-07-20 NOTE — Progress Notes (Signed)
Subjective:    Patient ID: Marcus Beasley, male    DOB: 12-24-1945, 75 y.o.   MRN: [id number]  DOS:  07/20/2020 Type of visit - description: CPX Since the last office visit, he saw cardiology and neurology, notes reviewed. In general feels well. He is somewhat concerned about weight loss. He denies fever, chills, night sweats. No headaches No depression Occasional dysphagia but that is not preventing him from eating, already evaluated by ST (per patient).  Wt Readings from Last 3 Encounters:  07/20/20 151 lb 4 oz (68.6 kg)  07/18/20 150 lb 6.4 oz (68.2 kg)  06/11/20 152 lb (68.9 kg)     Review of Systems  Other than above, a 14 point review of systems is negative     Past Medical History:  Diagnosis Date  . BPH (benign prostatic hyperplasia)    (-) Bx 2015  . Diabetes mellitus without complication ([place])   . Elevated PSA    Prostate Bx in 06/2013 was benign  . Glaucoma suspect   . HTN (hypertension)   . Hyperlipidemia   . Macular degeneration, age related     Past Surgical History:  Procedure Laterality Date  . ABCESS DRAINAGE     abdomen- 26 day hospitalization 1968  . COLONOSCOPY  2016  . HERNIA REPAIR  summer '11   umbilical, dr Ninfa Linden   . POLYPECTOMY    . PROSTATE BIOPSY  06-2013 , 07-2017   (-), (-)    Allergies as of 07/20/2020   No Known Allergies     Medication List       Accurate as of July 20, 2020 11:59 PM. If you have any questions, ask your nurse or doctor.        STOP taking these medications   amitriptyline 25 MG tablet Commonly known as: ELAVIL Stopped by: Kathlene November, MD   tamsulosin 0.4 MG Caps capsule Commonly known as: FLOMAX Stopped by: Kathlene November, MD     TAKE these medications   amLODipine 5 MG tablet Commonly known as: NORVASC Take 1 tablet (5 mg total) by mouth daily.   apixaban 5 MG Tabs tablet Commonly known as: Eliquis Take 1 tablet (5 mg total) by mouth 2 (two) times daily.   atorvastatin 40 MG tablet Commonly  known as: LIPITOR Take 1 tablet (40 mg total) by mouth daily. Please make annual appt with Dr. Martinique for refills. [phone]. 1st attempt.   diltiazem 240 MG 24 hr capsule Commonly known as: CARDIZEM CD TAKE 1 CAPSULE BY MOUTH EVERY DAY   finasteride 5 MG tablet Commonly known as: PROSCAR Take 5 mg by mouth daily.   meclizine 25 MG tablet Commonly known as: ANTIVERT Take 1 tablet (25 mg total) by mouth 2 (two) times daily as needed for dizziness.   metFORMIN 850 MG tablet Commonly known as: GLUCOPHAGE TAKE 1 TABLET (850 MG TOTAL) BY MOUTH 2 (TWO) TIMES DAILY WITH A MEAL.   metoprolol tartrate 50 MG tablet Commonly known as: LOPRESSOR Take 1 tablet (50 mg total) by mouth 2 (two) times daily.   multivitamin with minerals Tabs tablet Take 1 tablet by mouth daily.   OneTouch Verio test strip Generic drug: glucose blood CHECK BLOOD SUGAR NO MORE THAN TWICE DAILY   PRESERVISION/LUTEIN PO Take 1 tablet by mouth daily.   Restasis 0.05 % ophthalmic emulsion Generic drug: cycloSPORINE 1 drop 2 (two) times daily.          Objective:   Physical Exam BP 120/82 (BP  Location: Left Arm, Patient Position: Sitting, Cuff Size: Small)   Pulse 65   Temp 98.3 F (36.8 C) (Oral)   Resp 18   Ht 5\' 11"  (1.803 m)   Wt 151 lb 4 oz (68.6 kg)   SpO2 99%   BMI 21.10 kg/m  General: Well developed, NAD, BMI noted Neck: No  thyromegaly Lymphatic system: No LADs at the neck, armpits or groins. HEENT:  Normocephalic . Face symmetric, atraumatic Lungs:  CTA B Normal respiratory effort, no intercostal retractions, no accessory muscle use. Heart: RRR,  no murmur.  Abdomen:  Not distended, soft, non-tender. No rebound or rigidity.   Lower extremities: no pretibial edema bilaterally  Skin: Exposed areas without rash. Not pale. Not jaundice Neurologic:  alert & oriented X3.  Speech normal, gait assisted by a cane Strength: Mild left-sided weakness more noticeable on the upper  extremity.  + Spasticity upper extremity left.  Psych: Cognition and judgment appear intact.  Cooperative with normal attention span and concentration.  Behavior appropriate. No anxious or depressed appearing.     Assessment      Assessment DM  ---->  DX 07-2015, A1c 8.0; started metformin Neuropathy, mild.  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  Hep C serologies negative, hep B surface Ab+, hep B core antibody negative (consistent with vaccination)  PLAN Here for CPX DM: On Metformin, checking labs HTN: Seems well controlled on amlodipine, Cardizem, metoprolol.  Checking labs Hyperlipidemia: On atorvastatin, FLP. PAF: Saw cardiology 07/18/2020 , Felt to be stable Sequela of a stroke, Saw neurology 06/11/2020, still have issues with spasticity, was rec  to continue PT, Botox; for pain control, they noticed a # of meds that did not work, they stop tramadol and recommend Tylenol as needed Weight loss: Before his a stroke in 2019 he was 223 pounds, 06-2018: 170 pounds.  11/2019: 160 pounds.  Today 150 pounds. He denies fever chills or night sweats.  No depression.  He is actually trying to eat healthy.  We will do general labs, otherwise observation, and reassess on RTC. RTC 4 months   In addition to CPX, multiple other issues addressed today.  This visit occurred during the SARS-CoV-2 public health emergency.  Safety protocols were in place, including screening questions prior to the visit, additional usage of staff PPE, and extensive cleaning of exam room while observing appropriate contact time as indicated for disinfecting solutions.

## 2020-07-21 ENCOUNTER — Encounter: Payer: Self-pay | Admitting: Internal Medicine

## 2020-07-21 NOTE — Assessment & Plan Note (Signed)
Here for CPX DM: On Metformin, checking labs HTN: Seems well controlled on amlodipine, Cardizem, metoprolol.  Checking labs Hyperlipidemia: On atorvastatin, FLP. PAF: Saw cardiology 07/18/2020 , Felt to be stable Sequela of a stroke, Saw neurology 06/11/2020, still have issues with spasticity, was rec  to continue PT, Botox; for pain control, they noticed a # of meds that did not work, they stop tramadol and recommend Tylenol as needed Weight loss: Before his a stroke in 2019 he was 223 pounds, 06-2018: 170 pounds.  11/2019: 160 pounds.  Today 150 pounds. He denies fever chills or night sweats.  No depression.  He is actually trying to eat healthy.  We will do general labs, otherwise observation, and reassess on RTC. RTC 4 months

## 2020-07-21 NOTE — Assessment & Plan Note (Signed)
-  Td 08-2017 - PNM 23:  2011.  Booster today - Prevnar 2014 - zostavax 05-2015 -had shingrex #1 at work ? -COVID vaccines x3 -Had a flu shot - [facility]: Colonoscopy 06-2014, had polyps, cscope 10/2017, next per GI - Prostate cancer screening per urology.  -Diet: Doing well, watching his diet. -Labs: CMP, FLP, CBC, A1c, TSH

## 2020-07-23 MED ORDER — ATORVASTATIN CALCIUM 80 MG PO TABS: 80.0000 mg | ORAL_TABLET | Freq: Every day | ORAL | 1 refills | Status: DC

## 2020-07-23 NOTE — Addendum Note (Signed)
Addended byDamita Dunnings D on: 07/23/2020 09:29 AM   Modules accepted: Orders

## 2020-07-31 ENCOUNTER — Other Ambulatory Visit: Payer: Self-pay

## 2020-07-31 ENCOUNTER — Ambulatory Visit (INDEPENDENT_AMBULATORY_CARE_PROVIDER_SITE_OTHER): Payer: Medicare Other | Admitting: Podiatry

## 2020-07-31 DIAGNOSIS — M2042 Other hammer toe(s) (acquired), left foot: Secondary | ICD-10-CM

## 2020-07-31 DIAGNOSIS — L84 Corns and callosities: Secondary | ICD-10-CM | POA: Diagnosis not present

## 2020-07-31 DIAGNOSIS — B351 Tinea unguium: Secondary | ICD-10-CM | POA: Diagnosis not present

## 2020-07-31 DIAGNOSIS — M2041 Other hammer toe(s) (acquired), right foot: Secondary | ICD-10-CM

## 2020-07-31 DIAGNOSIS — M79675 Pain in left toe(s): Secondary | ICD-10-CM | POA: Diagnosis not present

## 2020-07-31 DIAGNOSIS — M21612 Bunion of left foot: Secondary | ICD-10-CM

## 2020-07-31 DIAGNOSIS — E119 Type 2 diabetes mellitus without complications: Secondary | ICD-10-CM | POA: Diagnosis not present

## 2020-07-31 DIAGNOSIS — M2012 Hallux valgus (acquired), left foot: Secondary | ICD-10-CM

## 2020-07-31 DIAGNOSIS — M79674 Pain in right toe(s): Secondary | ICD-10-CM

## 2020-07-31 DIAGNOSIS — E1142 Type 2 diabetes mellitus with diabetic polyneuropathy: Secondary | ICD-10-CM

## 2020-08-01 ENCOUNTER — Encounter: Payer: Self-pay | Admitting: Podiatry

## 2020-08-01 NOTE — Progress Notes (Addendum)
  Subjective:  Patient ID: Marcus Beasley, male    DOB: May 19, 1946,  MRN: [id number]  Chief Complaint  Patient presents with  . diabetic foot care    Nail and callus trim    75 y.o. male returns with the above complaint. History confirmed with patient.   Objective:  Physical Exam: warm, good capillary refill, no trophic changes or ulcerative lesions, normal DP and PT pulses and normal sensory exam. Onychomycosis is noted to all toenails with brown discoloration, thickening the nail plate and subungual debris Left Foot:  L dorsal PIPJ corn fifth toe  right Foot: medial hallux pinch tyloma, submetatarsal 5  Assessment:   1. Onychomycosis   2. Callus of foot   3. Pain due to onychomycosis of toenails of both feet   4. Type 2 diabetes mellitus without complication, without long-term current use of insulin ([place])   5. Diabetic polyneuropathy associated with type 2 diabetes mellitus ([facility])   6. Hallux valgus with bunions, left   7. Hammertoe of left foot   8. Hammertoe of right foot      Plan:  Patient was evaluated and treated and all questions answered.  Patient educated on diabetes. Discussed proper diabetic foot care and discussed risks and complications of disease. Educated patient in depth on reasons to return to the office immediately should he/she discover anything concerning or new on the feet. All questions answered. Discussed proper shoes as well.   Discussed the etiology and treatment options for the condition in detail with the patient. Educated patient on the topical and oral treatment options for mycotic nails. Recommended debridement of the nails today. Sharp and mechanical debridement performed of all painful and mycotic nails today. Nails debrided in length and thickness using a nail nipper and a mechanical burr to level of comfort. Discussed treatment options including appropriate shoe gear. Follow up as needed for painful nails.  All symptomatic hyperkeratoses were  safely debrided with a sterile #15 blade to patient's level of comfort without incident. We discussed preventative and palliative care of these lesions including supportive and accommodative shoegear, padding, prefabricated and custom molded accommodative orthoses, use of a pumice stone and lotions/creams daily.   Return in about 3 months (around 10/28/2020) for at risk diabetic foot care.

## 2020-08-06 ENCOUNTER — Telehealth: Payer: Self-pay

## 2020-08-06 NOTE — Telephone Encounter (Signed)
Spoke to patient's wife advised patient was denied approval for patient assistance for Eliquis.

## 2020-08-13 ENCOUNTER — Other Ambulatory Visit: Payer: Self-pay | Admitting: Cardiology

## 2020-08-21 ENCOUNTER — Other Ambulatory Visit: Payer: Self-pay

## 2020-08-21 ENCOUNTER — Ambulatory Visit: Payer: Medicare Other

## 2020-08-21 DIAGNOSIS — B351 Tinea unguium: Secondary | ICD-10-CM

## 2020-08-21 NOTE — Progress Notes (Signed)
Patient was measured for diabetic shoes and inserts. Patient signed ABN and will be called back when they get here.

## 2020-08-22 ENCOUNTER — Other Ambulatory Visit: Payer: Self-pay | Admitting: Cardiology

## 2020-08-22 ENCOUNTER — Telehealth: Payer: Self-pay | Admitting: Podiatry

## 2020-08-22 MED ORDER — ATORVASTATIN CALCIUM 80 MG PO TABS: 80.0000 mg | ORAL_TABLET | Freq: Every day | ORAL | 3 refills | Status: DC

## 2020-08-22 NOTE — Telephone Encounter (Signed)
Called pt about the diabetic  shoes he picked out when he was in yesterday to be fitted. Both shoes were for women and I just wanted to make him aware. He was not aware and told me to pick out a low cut boot for him and I have done this and told pt if when they come in he did not like it we could always change it.

## 2020-08-23 ENCOUNTER — Telehealth: Payer: Self-pay

## 2020-08-23 NOTE — Telephone Encounter (Signed)
Diabetic shoe form received from Dr. Sherryle Lis. Last foot exam over 6 months ago. Called Pt appt scheduled 08/24/20 at 3:20pm.

## 2020-08-24 ENCOUNTER — Other Ambulatory Visit: Payer: Self-pay

## 2020-08-24 ENCOUNTER — Ambulatory Visit (INDEPENDENT_AMBULATORY_CARE_PROVIDER_SITE_OTHER): Payer: Medicare Other | Admitting: Internal Medicine

## 2020-08-24 ENCOUNTER — Encounter: Payer: Self-pay | Admitting: Internal Medicine

## 2020-08-24 VITALS — BP 116/74 | HR 74 | Temp 98.4°F | Resp 16 | Ht 71.0 in | Wt 149.5 lb

## 2020-08-24 DIAGNOSIS — E1140 Type 2 diabetes mellitus with diabetic neuropathy, unspecified: Secondary | ICD-10-CM | POA: Diagnosis not present

## 2020-08-24 NOTE — Patient Instructions (Signed)
Diabetes Mellitus and Foot Care Foot care is an important part of your health, especially when you have diabetes. Diabetes may cause you to have problems because of poor blood flow (circulation) to your feet and legs, which can cause your skin to:  Become thinner and drier.  Break more easily.  Heal more slowly.  Peel and crack. You may also have nerve damage (neuropathy) in your legs and feet, causing decreased feeling in them. This means that you may not notice minor injuries to your feet that could lead to more serious problems. Noticing and addressing any potential problems early is the best way to prevent future foot problems. How to care for your feet Foot hygiene  Wash your feet daily with warm water and mild soap. Do not use hot water. Then, pat your feet and the areas between your toes until they are completely dry. Do not soak your feet as this can dry your skin.  Trim your toenails straight across. Do not dig under them or around the cuticle. File the edges of your nails with an emery board or nail file.  Apply a moisturizing lotion or petroleum jelly to the skin on your feet and to dry, brittle toenails. Use lotion that does not contain alcohol and is unscented. Do not apply lotion between your toes.   Shoes and socks  Wear clean socks or stockings every day. Make sure they are not too tight. Do not wear knee-high stockings since they may decrease blood flow to your legs.  Wear shoes that fit properly and have enough cushioning. Always look in your shoes before you put them on to be sure there are no objects inside.  To break in new shoes, wear them for just a few hours a day. This prevents injuries on your feet. Wounds, scrapes, corns, and calluses  Check your feet daily for blisters, cuts, bruises, sores, and redness. If you cannot see the bottom of your feet, use a mirror or ask someone for help.  Do not cut corns or calluses or try to remove them with medicine.  If you  find a minor scrape, cut, or break in the skin on your feet, keep it and the skin around it clean and dry. You may clean these areas with mild soap and water. Do not clean the area with peroxide, alcohol, or iodine.  If you have a wound, scrape, corn, or callus on your foot, look at it several times a day to make sure it is healing and not infected. Check for: ? Redness, swelling, or pain. ? Fluid or blood. ? Warmth. ? Pus or a bad smell.   General tips  Do not cross your legs. This may decrease blood flow to your feet.  Do not use heating pads or hot water bottles on your feet. They may burn your skin. If you have lost feeling in your feet or legs, you may not know this is happening until it is too late.  Protect your feet from hot and cold by wearing shoes, such as at the beach or on hot pavement.  Schedule a complete foot exam at least once a year (annually) or more often if you have foot problems. Report any cuts, sores, or bruises to your health care provider immediately. Where to find more information  American Diabetes Association: www.diabetes.org  Association of Diabetes Care & Education Specialists: www.diabeteseducator.org Contact a health care provider if:  You have a medical condition that increases your risk of infection and   you have any cuts, sores, or bruises on your feet.  You have an injury that is not healing.  You have redness on your legs or feet.  You feel burning or tingling in your legs or feet.  You have pain or cramps in your legs and feet.  Your legs or feet are numb.  Your feet always feel cold.  You have pain around any toenails. Get help right away if:  You have a wound, scrape, corn, or callus on your foot and: ? You have pain, swelling, or redness that gets worse. ? You have fluid or blood coming from the wound, scrape, corn, or callus. ? Your wound, scrape, corn, or callus feels warm to the touch. ? You have pus or a bad smell coming from  the wound, scrape, corn, or callus. ? You have a fever. ? You have a red line going up your leg. Summary  Check your feet every day for blisters, cuts, bruises, sores, and redness.  Apply a moisturizing lotion or petroleum jelly to the skin on your feet and to dry, brittle toenails.  Wear shoes that fit properly and have enough cushioning.  If you have foot problems, report any cuts, sores, or bruises to your health care provider immediately.  Schedule a complete foot exam at least once a year (annually) or more often if you have foot problems. This information is not intended to replace advice given to you by your health care provider. Make sure you discuss any questions you have with your health care provider. Document Revised: 12/29/2019 Document Reviewed: 12/29/2019 Elsevier Patient Education  2021 Elsevier Inc.  

## 2020-08-24 NOTE — Progress Notes (Signed)
Subjective:    Patient ID: Marcus Beasley, male    DOB: Nov 28, 1945, 75 y.o.   MRN: [id number]  DOS:  08/24/2020 Type of visit - description: acute  Patient is in need of diabetic shoes. He is under the care of podiatry. He reports frequent calluses. Denies numbness but at night he has plantar-feet  pain, not burning , just pain. No edema.  Review of Systems See above   Past Medical History:  Diagnosis Date  . BPH (benign prostatic hyperplasia)    (-) Bx 2015  . Diabetes mellitus without complication ([place])   . Elevated PSA    Prostate Bx in 06/2013 was benign  . Glaucoma suspect   . HTN (hypertension)   . Hyperlipidemia   . Macular degeneration, age related     Past Surgical History:  Procedure Laterality Date  . ABCESS DRAINAGE     abdomen- 26 day hospitalization 1968  . COLONOSCOPY  2016  . HERNIA REPAIR  summer '11   umbilical, dr Ninfa Linden   . POLYPECTOMY    . PROSTATE BIOPSY  06-2013 , 07-2017   (-), (-)    Allergies as of 08/24/2020   No Known Allergies     Medication List       Accurate as of August 24, 2020  3:33 PM. If you have any questions, ask your nurse or doctor.        amLODipine 5 MG tablet Commonly known as: NORVASC Take 1 tablet (5 mg total) by mouth daily.   atorvastatin 80 MG tablet Commonly known as: LIPITOR Take 1 tablet (80 mg total) by mouth daily. What changed: Another medication with the same name was removed. Continue taking this medication, and follow the directions you see here. Changed by: Kathlene November, MD   diltiazem 240 MG 24 hr capsule Commonly known as: CARDIZEM CD TAKE 1 CAPSULE BY MOUTH EVERY DAY   Eliquis 5 MG Tabs tablet Generic drug: apixaban TAKE 1 TABLET BY MOUTH TWICE A DAY   finasteride 5 MG tablet Commonly known as: PROSCAR Take 5 mg by mouth daily.   meclizine 25 MG tablet Commonly known as: ANTIVERT Take 1 tablet (25 mg total) by mouth 2 (two) times daily as needed for dizziness.   metFORMIN 850 MG  tablet Commonly known as: GLUCOPHAGE TAKE 1 TABLET (850 MG TOTAL) BY MOUTH 2 (TWO) TIMES DAILY WITH A MEAL.   metoprolol tartrate 50 MG tablet Commonly known as: LOPRESSOR TAKE 1 TABLET BY MOUTH TWICE A DAY   multivitamin with minerals Tabs tablet Take 1 tablet by mouth daily.   OneTouch Verio test strip Generic drug: glucose blood CHECK BLOOD SUGAR NO MORE THAN TWICE DAILY   PRESERVISION/LUTEIN PO Take 1 tablet by mouth daily.   Restasis 0.05 % ophthalmic emulsion Generic drug: cycloSPORINE 1 drop 2 (two) times daily.          Objective:   Physical Exam BP 116/74 (BP Location: Left Arm, Patient Position: Sitting, Cuff Size: Normal)   Pulse 74   Temp 98.4 F (36.9 C) (Oral)   Resp 16   Ht 5\' 11"  (1.803 m)   Wt 149 lb 8 oz (67.8 kg)   SpO2 97%   BMI 20.85 kg/m  General:   Well developed, NAD, BMI noted. HEENT:  Normocephalic . Face symmetric, atraumatic DM foot exam: No edema Good pedal pulses bilaterally High arch noted Toes with multiple deformities, callus formation at the left fourth and fifth toes. Pinprick examination: Slightly decreased  distally bilaterally. No ulcers noted Skin: Not pale. Not jaundice Neurologic:  alert & oriented X3.  Speech normal, gait appropriate for age and unassisted Psych--  Cognition and judgment appear intact.  Cooperative with normal attention span and concentration.  Behavior appropriate. No anxious or depressed appearing.      Assessment    Assessment DM  ---->  DX 07-2015, A1c 8.0; started metformin Neuropathy, mild.  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  Hep C serologies negative, hep B surface Ab+, hep B core antibody negative (consistent with vaccination)  PLAN DM: On Metformin, controlled, there  is evidence of diabetic neuropathy on exam and by symptoms. He also has calluses due to toe deformities. He qualifies for diabetic shoes, form provided. DM neuropathy: See above    This visit occurred during the SARS-CoV-2 public health emergency.  Safety protocols were in place, including screening questions prior to the visit, additional usage of staff PPE, and extensive cleaning of exam room while observing appropriate contact time as indicated for disinfecting solutions.

## 2020-08-26 NOTE — Assessment & Plan Note (Signed)
DM: On Metformin, controlled, there is evidence of diabetic neuropathy on exam and by symptoms. He also has calluses due to toe deformities. He qualifies for diabetic shoes, form provided. DM neuropathy: See above

## 2020-08-27 NOTE — Telephone Encounter (Signed)
Form and office visit notes faxed to [phone]. Form sent for scanning.

## 2020-09-10 ENCOUNTER — Other Ambulatory Visit: Payer: Self-pay

## 2020-09-10 ENCOUNTER — Encounter: Payer: Medicare Other | Attending: Physical Medicine & Rehabilitation | Admitting: Physical Medicine & Rehabilitation

## 2020-09-10 ENCOUNTER — Encounter: Payer: Self-pay | Admitting: Physical Medicine & Rehabilitation

## 2020-09-10 VITALS — BP 115/75 | HR 68 | Temp 97.4°F | Ht 71.0 in | Wt 149.4 lb

## 2020-09-10 DIAGNOSIS — G8110 Spastic hemiplegia affecting unspecified side: Secondary | ICD-10-CM | POA: Diagnosis not present

## 2020-09-10 DIAGNOSIS — M792 Neuralgia and neuritis, unspecified: Secondary | ICD-10-CM | POA: Diagnosis not present

## 2020-09-10 DIAGNOSIS — R269 Unspecified abnormalities of gait and mobility: Secondary | ICD-10-CM | POA: Diagnosis not present

## 2020-09-10 DIAGNOSIS — R208 Other disturbances of skin sensation: Secondary | ICD-10-CM | POA: Diagnosis not present

## 2020-09-10 DIAGNOSIS — I69354 Hemiplegia and hemiparesis following cerebral infarction affecting left non-dominant side: Secondary | ICD-10-CM | POA: Diagnosis not present

## 2020-09-10 NOTE — Progress Notes (Signed)
Subjective:    Patient ID: Marcus Beasley, male    DOB: 1946-05-08, 75 y.o.   MRN: [id number]  HPI:  Right-handed male with history of diabetes mellitus, hypertension presents for follow up for right thalamic hemorrhage, now with spasticity.   Last clinic visit 06/11/2020.  Since that time, patient states he completed therapies.  Doing HEP. States he is ambulating more.  Denies falls.  He continues to complain of stiffness with lack of activity in LUE.  Pain Inventory Average Pain 5 Pain Right Now 5 My pain is constant, burning, dull, stabbing and tingling  In the last 24 hours, has pain interfered with the following? General activity 5 Relation with others 5 Enjoyment of life 5 What TIME of day is your pain at its worst? Anytime Sleep (in general) Fair  Pain is worse with: some activites Pain improves with: injections Relief from Meds: 4  Family History  Problem Relation Age of Onset  . Leukemia Mother   . Heart attack Father        MI age 83  . Heart disease Sister        age 53  . Cancer - Other Sister        type  . Heart disease Brother        age 56  . Kidney disease Brother        HD  . Diabetes Maternal Grandmother   . Prostate cancer Neg Hx   . Colon cancer Neg Hx   . Esophageal cancer Neg Hx   . Rectal cancer Neg Hx   . Stomach cancer Neg Hx    Social History   Socioeconomic History  . Marital status: Married    Spouse name: Not on file  . Number of children: 3  . Years of education: 62  . Highest education level: Not on file  Occupational History  . 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 Smoking status: Never Smoker  . Smokeless tobacco: Never Used  Vaping Use  . Vaping Use: Never used  Substance and Sexual Activity  . Alcohol use: Yes    Comment: occ. beer  . Drug use: No  . Sexual activity: Yes    Partners: Female  Other Topics Concern  . Not on file  Social History Narrative    HSG. Marcus Beasley    Married - '69. 2 dtrs , 1 son - homicide. 5 grandchildren.     Household: pt, wife, daughter and son on-off    Driving limited as off 06/2020    Social Determinants of Health   Financial Resource Strain: Not on file  Food Insecurity: Not on file  Transportation Needs: Not on file  Physical Activity: Not on file  Stress: Not on file  Social Connections: Not on file   Past Surgical History:  Procedure Laterality Date  . ABCESS DRAINAGE     abdomen- 26 day hospitalization 1968  . COLONOSCOPY  2016  . HERNIA REPAIR  summer '11   umbilical, dr Ninfa Linden   . POLYPECTOMY    . PROSTATE BIOPSY  06-2013 , 07-2017   (-), (-)   Past Medical History:  Diagnosis Date  . BPH (benign prostatic hyperplasia)    (-) Bx 2015  . Diabetes mellitus without complication ([place])   . Elevated PSA    Prostate Bx in 06/2013 was benign  . Glaucoma suspect   . HTN (hypertension)   . Hyperlipidemia   .  Macular degeneration, age related    BP 115/75   Pulse 68   Temp (!) 97.4 F (36.3 C)   Ht 5\' 11"  (1.803 m)   Wt 149 lb 6.4 oz (67.8 kg)   SpO2 98%   BMI 20.84 kg/m   Opioid Risk Score:   Fall Risk Score:  `1  Depression screen PHQ 2/9  Depression screen [facility] 2/9 09/10/2020 03/27/2020 02/09/2020 12/19/2019 11/29/2019 11/11/2018 10/06/2018  Decreased Interest 0 0 0 0 0 0 0  Down, Depressed, Hopeless 0 0 0 0 0 0 0  PHQ - 2 Score 0 0 0 0 0 0 0  Some recent data might be hidden     Review of Systems  Constitutional: Negative.        Appetite poor and feels like he is still losing weight. (has not weighed since last visit)  HENT: Negative.   Eyes: Negative.   Respiratory: Negative.   Cardiovascular: Negative.   Gastrointestinal: Negative.   Endocrine: Negative.   Genitourinary: Negative.        Hesitancy  Musculoskeletal: Positive for arthralgias, gait problem and myalgias.  Skin: Negative.   Allergic/Immunologic: Negative.   Neurological: Positive for weakness and numbness.        Tingling  Hematological: Negative.        On eliquis  Psychiatric/Behavioral: Negative.   All other systems reviewed and are negative.     Objective:   Physical Exam  Constitutional: No distress . Vital signs reviewed. HENT: Normocephalic.  Atraumatic. Eyes: EOMI. No discharge. Cardiovascular: No JVD.   Respiratory: Normal effort.  No stridor.   GI: Non-distended.   Skin: Warm and dry.  Intact. Psych: Normal mood.  Normal behavior. Musc: No pain with ER/IR Left shoulder No pain with shoulder abduction >90 deg Gait: Left hemiparetic, improving Neuro: Alert Motor:  LUE: Shoulder abduction 4/5, elbow flex 4+/5, elbow extension 4+/5, hand grip 4+/5 with apraxia, improving LLE: HF, KE, ADF 4+/5, improving Mas: limited due to patient resistance, unchanged    Assessment & Plan:  Right-handed male with history of diabetes mellitus, hypertension presents for follow up for right thalamic hemorrhage, now with some spasticity.   1. 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              Shoulder xray reviewed showing shoulder OA             Sternoclavicular xray reviewed, unremarkable See #6   2. Pain Management:  ?No benefit with Baclofen  No benefit with Elavil, d/ced             No benefit with Lyrica, d/ced  No benefit with Tizanidine, d/ced Tylenol as needed D/ced Tramadol Cont ROM  Continue stretching  3. Gait abnormality  Continue  HEP  Continue cane for safety  4. Post stroke shoulder pain Subacromial steroid injection under ulatrasoundwith benefit in pain and ROM             Intraarticular steroid injection with benefit in ROM and "stiffness"  Controlled at present  5. Myalgia  Minimal benefit with trigger point injections  See #  2  6. LUE dysesthesias and tightness  See #1  NCS/EMG showing polyneuropathy, as well as Left C6-T1 radiculopathy, Right C7-8 radiculopathy  Likely secondary to stroke - gave option of Botulinum toxin again, however, patient states can tolerate stiffness with ROM, will hold off at present  Patient would like to return in 6 months.

## 2020-09-11 ENCOUNTER — Emergency Department (HOSPITAL_BASED_OUTPATIENT_CLINIC_OR_DEPARTMENT_OTHER): Payer: Medicare Other

## 2020-09-11 ENCOUNTER — Emergency Department (HOSPITAL_BASED_OUTPATIENT_CLINIC_OR_DEPARTMENT_OTHER)
Admission: EM | Admit: 2020-09-11 | Discharge: 2020-09-11 | Disposition: A | Discharge status: Home / Self Care | Payer: Medicare Other | Attending: Emergency Medicine | Admitting: Emergency Medicine

## 2020-09-11 ENCOUNTER — Encounter (HOSPITAL_BASED_OUTPATIENT_CLINIC_OR_DEPARTMENT_OTHER): Payer: Self-pay | Admitting: *Deleted

## 2020-09-11 ENCOUNTER — Other Ambulatory Visit: Payer: Self-pay

## 2020-09-11 DIAGNOSIS — E1142 Type 2 diabetes mellitus with diabetic polyneuropathy: Secondary | ICD-10-CM | POA: Diagnosis not present

## 2020-09-11 DIAGNOSIS — S3991XA Unspecified injury of abdomen, initial encounter: Secondary | ICD-10-CM | POA: Diagnosis not present

## 2020-09-11 DIAGNOSIS — W19XXXA Unspecified fall, initial encounter: Secondary | ICD-10-CM

## 2020-09-11 DIAGNOSIS — R0781 Pleurodynia: Secondary | ICD-10-CM | POA: Insufficient documentation

## 2020-09-11 DIAGNOSIS — E1165 Type 2 diabetes mellitus with hyperglycemia: Secondary | ICD-10-CM | POA: Insufficient documentation

## 2020-09-11 DIAGNOSIS — Z7984 Long term (current) use of oral hypoglycemic drugs: Secondary | ICD-10-CM | POA: Insufficient documentation

## 2020-09-11 DIAGNOSIS — M25512 Pain in left shoulder: Secondary | ICD-10-CM | POA: Insufficient documentation

## 2020-09-11 DIAGNOSIS — I10 Essential (primary) hypertension: Secondary | ICD-10-CM | POA: Insufficient documentation

## 2020-09-11 DIAGNOSIS — R2681 Unsteadiness on feet: Secondary | ICD-10-CM | POA: Insufficient documentation

## 2020-09-11 DIAGNOSIS — Z8546 Personal history of malignant neoplasm of prostate: Secondary | ICD-10-CM | POA: Insufficient documentation

## 2020-09-11 DIAGNOSIS — Z79899 Other long term (current) drug therapy: Secondary | ICD-10-CM | POA: Diagnosis not present

## 2020-09-11 DIAGNOSIS — S0990XA Unspecified injury of head, initial encounter: Secondary | ICD-10-CM | POA: Diagnosis not present

## 2020-09-11 DIAGNOSIS — Z8673 Personal history of transient ischemic attack (TIA), and cerebral infarction without residual deficits: Secondary | ICD-10-CM | POA: Insufficient documentation

## 2020-09-11 DIAGNOSIS — W109XXA Fall (on) (from) unspecified stairs and steps, initial encounter: Secondary | ICD-10-CM | POA: Insufficient documentation

## 2020-09-11 DIAGNOSIS — Z043 Encounter for examination and observation following other accident: Secondary | ICD-10-CM | POA: Diagnosis not present

## 2020-09-11 DIAGNOSIS — T1490XA Injury, unspecified, initial encounter: Secondary | ICD-10-CM

## 2020-09-11 DIAGNOSIS — S299XXA Unspecified injury of thorax, initial encounter: Secondary | ICD-10-CM | POA: Diagnosis not present

## 2020-09-11 LAB — CBC WITH DIFFERENTIAL/PLATELET
Abs Immature Granulocytes: 0.06 10*3/uL (ref 0.00–0.07)
Basophils Absolute: 0 10*3/uL (ref 0.0–0.1)
Basophils Relative: 0 %
Eosinophils Absolute: 0.1 10*3/uL (ref 0.0–0.5)
Eosinophils Relative: 1 %
HCT: 40.8 % (ref 39.0–52.0)
Hemoglobin: 13.1 g/dL (ref 13.0–17.0)
Immature Granulocytes: 1 %
Lymphocytes Relative: 23 %
Lymphs Abs: 1.6 10*3/uL (ref 0.7–4.0)
MCH: 28.5 pg (ref 26.0–34.0)
MCHC: 32.1 g/dL (ref 30.0–36.0)
MCV: 88.9 fL (ref 80.0–100.0)
Monocytes Absolute: 0.5 10*3/uL (ref 0.1–1.0)
Monocytes Relative: 7 %
Neutro Abs: 4.8 10*3/uL (ref 1.7–7.7)
Neutrophils Relative %: 68 %
Platelets: 279 10*3/uL (ref 150–400)
RBC: 4.59 MIL/uL (ref 4.22–5.81)
RDW: 13.7 % (ref 11.5–15.5)
WBC: 7.1 10*3/uL (ref 4.0–10.5)
nRBC: 0 % (ref 0.0–0.2)

## 2020-09-11 LAB — BASIC METABOLIC PANEL
Anion gap: 9 (ref 5–15)
BUN: 17 mg/dL (ref 8–23)
CO2: 23 mmol/L (ref 22–32)
Calcium: 9.4 mg/dL (ref 8.9–10.3)
Chloride: 103 mmol/L (ref 98–111)
Creatinine, Ser: 0.88 mg/dL (ref 0.61–1.24)
GFR, Estimated: >60 mL/min (ref >60)
Glucose, Bld: 131 mg/dL — ABNORMAL HIGH (ref 70–99)
Potassium: 4 mmol/L (ref 3.5–5.1)
Sodium: 135 mmol/L (ref 135–145)

## 2020-09-11 LAB — CBG MONITORING, ED: Glucose-Capillary: 121 mg/dL — ABNORMAL HIGH (ref 70–99)

## 2020-09-11 LAB — APTT: aPTT: 32 seconds (ref 24–36)

## 2020-09-11 LAB — PROTIME-INR
INR: 1.2 (ref 0.8–1.2)
Prothrombin Time: 14.8 seconds (ref 11.4–15.2)

## 2020-09-11 MED ORDER — SODIUM CHLORIDE 0.9 % IV BOLUS
500.0000 mL | Freq: Once | INTRAVENOUS | Status: AC
  Administered 2020-09-11: 500 mL via INTRAVENOUS

## 2020-09-11 MED ORDER — FENTANYL CITRATE (PF) 100 MCG/2ML IJ SOLN
50.0000 ug | Freq: Once | INTRAMUSCULAR | Status: AC
Start: 2020-09-11 — End: 2020-09-11
  Administered 2020-09-11: 50 ug via INTRAVENOUS
  Filled 2020-09-11: qty 2

## 2020-09-11 MED ORDER — TRAMADOL HCL 50 MG PO TABS: 50.0000 mg | ORAL_TABLET | Freq: Two times a day (BID) | ORAL | 0 refills | Status: DC | PRN

## 2020-09-11 MED ORDER — TRAMADOL HCL 50 MG PO TABS
50.0000 mg | ORAL_TABLET | Freq: Once | ORAL | Status: AC
  Administered 2020-09-11: 50 mg via ORAL
  Filled 2020-09-11: qty 1

## 2020-09-11 MED ORDER — IOHEXOL 300 MG/ML  SOLN
100.0000 mL | Freq: Once | INTRAMUSCULAR | Status: AC | PRN
  Administered 2020-09-11: 100 mL via INTRAVENOUS

## 2020-09-11 NOTE — ED Provider Notes (Cosign Needed Addendum)
[facility] EMERGENCY DEPARTMENT Provider Note   CSN: [id number] Arrival date & time: 09/11/20  1939     History Chief Complaint  Patient presents with  . Fall    Marcus Beasley is a 75 y.o. male history of CVA, diabetes, hypertension, hyperlipidemia, prostate on Eliquis.  Patient reports that just prior to arrival he was walking up his stairs carrying a tray of food when he lost his balance falling backwards down the steps.  Patient reports that he normally walks with a cane and was "trying to do too much" which caused him to fall.  He denies any preceding dizziness or loss of consciousness.  Patient reports that he fell onto his left side he is endorsing left rib and left shoulder pain aching constant moderate intensity worsened with movement palpation no alleviating factors or radiation of pain.  He denies head injury, loss conscious, headache, neck pain, back pain, abdominal pain, vomiting, numbness/tingling, weakness or any additional concerns.  HPI     Past Medical History:  Diagnosis Date  . BPH (benign prostatic hyperplasia)    (-) Bx 2015  . Diabetes mellitus without complication ([place])   . Elevated PSA    Prostate Bx in 06/2013 was benign  . Glaucoma suspect   . HTN (hypertension)   . Hyperlipidemia   . Macular degeneration, age related     Patient Active Problem List   Diagnosis Date Noted  . Type 2 diabetes mellitus without complication, without long-term current use of insulin ([place]) 03/27/2020  . Cervical disc disorder with radiculopathy of cervical region 03/27/2020  . Diabetic polyneuropathy associated with type 2 diabetes mellitus ([place]) 03/27/2020  . Dysesthesia 01/24/2020  . Glaucoma suspect 01/12/2020  . Myalgia 11/29/2019  . Primary osteoarthritis of left shoulder 10/27/2019  . Chronic left shoulder pain 05/12/2019  . Subacromial bursitis of left shoulder joint 09/10/2018  . Spastic hemiplegia affecting nondominant side ([place]) 07/07/2018  .  Abnormality of gait 06/09/2018  . Neuropathic pain   . Labile blood pressure   . PAF (paroxysmal atrial fibrillation) ([place])   . Hemiparesis affecting left side as late effect of stroke ([place])   . Thalamic hemorrhage ([place]) 03/17/2018  . Benign essential HTN   . Dyslipidemia   . Hemorrhagic stroke ([place])   . ICH (intracerebral hemorrhage) ([place]) 03/12/2018  . Prostate cancer ([place]) 01/29/2017  . 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 Diabetes ([place]) 12/14/2015  . PCP NOTES >>>>>>>>>>>>>>>>>>>>>>>>>>>>>>. 08/06/2015  . Dizziness and giddiness 01/29/2015  . Umbilical hernia 52/77/8242  . Elevated PSA, less than 10 ng/ml 04/19/2012  . Annual physical exam 01/24/2011  . Hyperlipidemia 01/03/2010  . Essential hypertension 09/20/2007    Past Surgical History:  Procedure Laterality Date  . ABCESS DRAINAGE     abdomen- 26 day hospitalization 1968  . COLONOSCOPY  2016  . HERNIA REPAIR  summer '11   umbilical, dr Ninfa Linden   . POLYPECTOMY    . PROSTATE BIOPSY  06-2013 , 07-2017   (-), (-)       Family History  Problem Relation Age of Onset  . Leukemia Mother   . Heart attack Father        MI age 68  . Heart disease Sister        age 90  . Cancer - Other Sister        type  . Heart disease Brother        age 56  . Kidney disease Brother  HD  . Diabetes Maternal Grandmother   . Prostate cancer Neg Hx   . Colon cancer Neg Hx   . Esophageal cancer Neg Hx   . Rectal cancer Neg Hx   . Stomach cancer Neg Hx     Social History   Tobacco Use  . Smoking status: Never Smoker  . Smokeless tobacco: Never Used  Vaping Use  . Vaping Use: Never used  Substance Use Topics  . Alcohol use: Yes    Comment: occ. beer  . Drug use: No    Home Medications Prior to Admission medications   Medication Sig Start Date End Date Taking? Authorizing Provider  amLODipine (NORVASC) 5 MG tablet Take 1 tablet (5 mg total) by mouth daily. 06/25/20   Colon Branch, MD   atorvastatin (LIPITOR) 80 MG tablet Take 1 tablet (80 mg total) by mouth daily. 08/22/20 11/20/20  Martinique, Peter M, MD  diltiazem [facility] CD) 240 MG 24 hr capsule TAKE 1 CAPSULE BY MOUTH EVERY DAY 03/26/20   [place], Peter M, MD  ELIQUIS 5 MG TABS tablet TAKE 1 TABLET BY MOUTH TWICE A DAY 08/14/20   [place], Peter M, MD  finasteride (PROSCAR) 5 MG tablet Take 5 mg by mouth daily. 10/03/19   [provider]  meclizine (ANTIVERT) 25 MG tablet Take 1 tablet (25 mg total) by mouth 2 (two) times daily as needed for dizziness. 06/14/18   Colon Branch, MD  metFORMIN (GLUCOPHAGE) 850 MG tablet TAKE 1 TABLET (850 MG TOTAL) BY MOUTH 2 (TWO) TIMES DAILY WITH A MEAL. 06/11/20   Colon Branch, MD  metoprolol tartrate (LOPRESSOR) 50 MG tablet TAKE 1 TABLET BY MOUTH TWICE A DAY 08/14/20   Martinique, Peter M, MD  Multiple Vitamin (MULTIVITAMIN WITH MINERALS) TABS tablet Take 1 tablet by mouth daily.     [provider]  Multiple Vitamins-Minerals (PRESERVISION/LUTEIN PO) Take 1 tablet by mouth daily.     [provider]  olopatadine (PATANOL) 0.1 % ophthalmic solution SMARTSIG:In Eye(s) 05/24/20   [provider]  ONETOUCH VERIO test strip CHECK BLOOD SUGAR NO MORE THAN TWICE DAILY 01/30/20   Colon Branch, MD  RESTASIS 0.05 % ophthalmic emulsion 1 drop 2 (two) times daily. 02/03/20   [provider]    Allergies    Patient has no known allergies.  Review of Systems   Review of Systems Ten systems are reviewed and are negative for acute change except as noted in the HPI  Physical Exam Updated Vital Signs BP 127/80   Pulse 76   Temp 98.2 F (36.8 C) (Oral)   Resp (!) 21   Ht 5\' 11"  (1.803 m)   Wt 68 kg   SpO2 100%   BMI 20.92 kg/m   Physical Exam Constitutional:      General: He is not in acute distress.    Appearance: Normal appearance. He is well-developed. He is not ill-appearing or diaphoretic.  HENT:     Head: Normocephalic and atraumatic.  Eyes:     General:  Vision grossly intact. Gaze aligned appropriately.     Pupils: Pupils are equal, round, and reactive to light.  Neck:     Trachea: Trachea and phonation normal.  Pulmonary:     Effort: Pulmonary effort is normal. No respiratory distress.  Chest:     Chest wall: Tenderness present. No deformity or crepitus.       Comments: Tenderness without overlying skin change Abdominal:     General: There  is no distension.     Palpations: Abdomen is soft.     Tenderness: There is no abdominal tenderness. There is no guarding or rebound.  Musculoskeletal:        General: Normal range of motion.     Cervical back: Normal range of motion.     Comments: No midline C/T/L spinal tenderness to palpation, no paraspinal muscle tenderness, no deformity, crepitus, or step-off noted. No sign of injury to the neck or back.  Pelvis stable compression bilaterally without pain.  Proper range of motion and strength with movements of the bilateral lower extremities for age without pain or deformity. - Appropriate range of motion and strength for age of the right upper extremity without pain. - Left shoulder: Prominent clavicle patient reports is chronic.  Increased pain with movement of the left shoulder, particularly above shoulder level.  No crepitus or deformity.  Radial pulses intact and equal.  Compartments soft.  No long bone pain.  No tenderness of the clavicle.  No pain with movement of the left elbow wrist or hand.  Skin:    General: Skin is warm and dry.  Neurological:     Mental Status: He is alert.     GCS: GCS eye subscore is 4. GCS verbal subscore is 5. GCS motor subscore is 6.     Comments: Speech is clear and goal oriented, follows commands Major Cranial nerves without deficit, no facial droop Moves extremities without ataxia, coordination intact  Psychiatric:        Behavior: Behavior normal.     ED Results / Procedures / Treatments   Labs (all labs ordered are listed, but only abnormal  results are displayed) Labs Reviewed  BASIC METABOLIC PANEL - Abnormal; Notable for the following components:      Result Value   Glucose, Bld 131 (*)    All other components within normal limits  CBG MONITORING, ED - Abnormal; Notable for the following components:   Glucose-Capillary 121 (*)    All other components within normal limits  CBC WITH DIFFERENTIAL/PLATELET  PROTIME-INR  APTT    EKG EKG Interpretation  Date/Time:  Tuesday September 11 2020 20:28:36 EDT Ventricular Rate:  89 PR Interval:  152 QRS Duration: 80 QT Interval:  356 QTC Calculation: 433 R Axis:   53 Text Interpretation: Normal sinus rhythm Normal ECG No significant change since last tracing Confirmed by Wandra Arthurs [phone]) on 09/11/2020 9:43:57 PM   Radiology CT Head Wo Contrast  Result Date: 09/11/2020 CLINICAL DATA:  Fall EXAM: CT HEAD WITHOUT CONTRAST TECHNIQUE: Contiguous axial images were obtained from the base of the skull through the vertex without intravenous contrast. COMPARISON:  11/03/2018 FINDINGS: Brain: Moderate related volume loss. No acute intracranial abnormality. Specifically, no hemorrhage, hydrocephalus, mass lesion, acute infarction, or significant intracranial injury. Vascular: No hyperdense vessel or unexpected calcification. Skull: No acute calvarial abnormality. Sinuses/Orbits: Visualized paranasal sinuses and mastoids clear. Orbital soft tissues unremarkable. Other: None IMPRESSION: No acute intracranial abnormality. Electronically Signed   By: Rolm Baptise M.D.   On: 09/11/2020 21:00   CT Cervical Spine Wo Contrast  Result Date: 09/11/2020 CLINICAL DATA:  Fall EXAM: CT CERVICAL SPINE WITHOUT CONTRAST TECHNIQUE: Multidetector CT imaging of the cervical spine was performed without intravenous contrast. Multiplanar CT image reconstructions were also generated. COMPARISON:  None. FINDINGS: Alignment: Normal Skull base and vertebrae: No acute fracture. No primary bone lesion or focal pathologic  process. Soft tissues and spinal canal: No prevertebral fluid or swelling. No  visible canal hematoma. Disc levels: Diffuse degenerative disc disease with disc space narrowing and spurring. Diffuse advanced degenerative facet disease bilaterally. Upper chest: No acute findings Other: None IMPRESSION: Moderate to advanced degenerative changes throughout the cervical spine. No acute bony abnormality. Electronically Signed   By: Rolm Baptise M.D.   On: 09/11/2020 21:01    Procedures Procedures   Medications Ordered in ED Medications  sodium chloride 0.9 % bolus 500 mL (0 mLs Intravenous Stopped 09/11/20 2252)  fentaNYL (SUBLIMAZE) injection 50 mcg (50 mcg Intravenous Given 09/11/20 2030)  iohexol (OMNIPAQUE) 300 MG/ML solution 100 mL (100 mLs Intravenous Contrast Given 09/11/20 2102)  traMADol (ULTRAM) tablet 50 mg (50 mg Oral Given 09/11/20 2252)    ED Course  I have reviewed the triage vital signs and the nursing notes.  Pertinent labs & imaging results that were available during my care of the patient were reviewed by me and considered in my medical decision making (see chart for details).    MDM Rules/Calculators/A&P                         Additional history obtained from: 1. Nursing notes from this visit. 2. Family, daughter at bedside. 3. Electronic medical records. --------------------- I ordered, reviewed and interpreted labs which include: CBC without leukocytosis or anemia. INR within normal limits. BMP shows no emergent lecture derangement, AKI or gap. CBG 121.  CT Head: IMPRESSION:  No acute intracranial abnormality.   CT Cspine:   IMPRESSION:  Moderate to advanced degenerative changes throughout the cervical  spine. No acute bony abnormality.   CT Chest/Abd/Pelvis: IMPRESSION: 1. No acute traumatic injury to the chest, abdomen, or pelvis.  2. No acute fracture or traumatic malalignment of the thoracic or lumbar spine. 3. Partially visualized left shoulder  demonstrates moderate to severe acromioclavicular degenerative changes. 4. Other imaging findings of potential clinical significance: Cholelithiasis with no acute cholecystitis. Prostatomegaly. Indeterminate subcentimeter hypodensity within the liver and within the spleen.  CXR: IMPRESSION: No active disease  DG Pelvis:IMPRESSION: Mild degenerative changes in the hips. No acute bony abnormality.  DG Left Shoulder:  IMPRESSION: Degenerative changes in the left shoulder. No acute bony abnormality. --------------- Patient reassessed resting comfortably no acute distress.  Patient and his daughter at bedside state understanding of findings today and will follow up with her PCP.  Patient was placed in sling for his shoulder pain and referred to orthopedist for further evaluation.  Discussed rice therapy.  Patient has no further questions or concerns and is requesting discharge.  Patient being taken home today by his daughter.  Patient's fall was mechanical in nature today, patient feels safe discharge in the care of family.  At this time there does not appear to be any evidence of an acute emergency medical condition and the patient appears stable for discharge with appropriate outpatient follow up. Diagnosis was discussed with patient who verbalizes understanding of care plan and is agreeable to discharge. I have discussed return precautions with patient and daughter who verbalizes understanding. Patient encouraged to follow-up with their PCP and orthopedist on call Dr. Erlinda Hong. All questions answered.  Patient seen and evaluated with Dr. Scherrie Bateman during this visit who agrees with discharge with tramadol and outpatient follow-up.  Note: Portions of this report may have been transcribed using voice recognition software. Every effort was made to ensure accuracy; however, inadvertent computerized transcription errors may still be present. Final Clinical Impression(s) / ED Diagnoses Final diagnoses:  Fall,  initial  encounter  Acute pain of left shoulder    Rx / DC Orders ED Discharge Orders    None       Gari Crown 09/11/20 2312    Gari Crown 09/11/20 2313    Drenda Freeze, MD 09/14/20 [phone]

## 2020-09-11 NOTE — ED Notes (Signed)
EDP at bedside  

## 2020-09-11 NOTE — ED Triage Notes (Signed)
He lost his balance and fell down 5 steps. Injury to his left shoulder and back. He walks with a cane. Hx of stroke 3 years ago that left him with left sided weakness.

## 2020-09-11 NOTE — Discharge Instructions (Addendum)
At this time there does not appear to be the presence of an emergent medical condition, however there is always the potential for conditions to change. Please read and follow the below instructions.  Please return to the Emergency Department immediately for any new or worsening symptoms. Please be sure to follow up with your Primary Care Provider within one week regarding your visit today; please call their office to schedule an appointment even if you are feeling better for a follow-up visit. You may take the medication Tramadol as prescribed to help with severe pain.  This medication will make you drowsy so do not drive, drink alcohol, take other sedating medications or perform any dangerous activities such as driving after taking Tramadol. Your x-rays today showed arthritis and other degenerative changes.  Please follow-up with the orthopedic specialist Dr. Erlinda Hong on your discharge paperwork for a follow-up appointment.  Please use the sling to protect your left shoulder from further injury until you are evaluated by the orthopedic specialist. Your CT scan today showed subcentimeter hypodensities of your liver, calcified gallstones, hypodensity of your spleen, this is in your kidneys, hypodensities in your kidneys, enlarged prostate, atherosclerosis, degenerative changes of your spine, moderate related volume loss of the brain.  Please discuss these incidental findings with your primary care provider at your follow-up visit.  Go to the nearest Emergency Department immediately if: You have fever or chills 1.You have:  Loss of feeling (numbness), tingling, or weakness in your arms or legs.  Very bad neck pain, especially tenderness in the middle of the back of your neck.  A change in your ability to control your pee or poop (stool).  More pain in any area of your body.  Swelling in any area of your body, especially your legs.  Shortness of breath or light-headedness.  Chest pain.  Blood in your pee,  poop, or vomit.  Very bad pain in your belly (abdomen) or your back.  Very bad headaches or headaches that are getting worse.  Sudden vision loss or double vision. Your eye suddenly turns red. The black center of your eye (pupil) is an odd shape or size. You have any new/concerning or worsening of symptoms.   Please read the additional information packets attached to your discharge summary.  Do not take your medicine if  develop an itchy rash, swelling in your mouth or lips, or difficulty breathing; call 911 and seek immediate emergency medical attention if this occurs.  You may review your lab tests and imaging results in their entirety on your MyChart account.  Please discuss all results of fully with your primary care provider and other specialist at your follow-up visit.  Note: Portions of this text may have been transcribed using voice recognition software. Every effort was made to ensure accuracy; however, inadvertent computerized transcription errors may still be present.

## 2020-09-14 ENCOUNTER — Ambulatory Visit (INDEPENDENT_AMBULATORY_CARE_PROVIDER_SITE_OTHER): Payer: Medicare Other | Admitting: Internal Medicine

## 2020-09-14 ENCOUNTER — Ambulatory Visit: Payer: Medicare Other | Admitting: Internal Medicine

## 2020-09-14 ENCOUNTER — Encounter: Payer: Self-pay | Admitting: Internal Medicine

## 2020-09-14 ENCOUNTER — Other Ambulatory Visit: Payer: Self-pay

## 2020-09-14 VITALS — BP 142/74 | HR 73 | Temp 98.0°F | Resp 18 | Ht 71.0 in | Wt 146.5 lb

## 2020-09-14 DIAGNOSIS — M25512 Pain in left shoulder: Secondary | ICD-10-CM | POA: Diagnosis not present

## 2020-09-14 DIAGNOSIS — W19XXXD Unspecified fall, subsequent encounter: Secondary | ICD-10-CM | POA: Diagnosis not present

## 2020-09-14 DIAGNOSIS — R269 Unspecified abnormalities of gait and mobility: Secondary | ICD-10-CM | POA: Diagnosis not present

## 2020-09-14 DIAGNOSIS — I619 Nontraumatic intracerebral hemorrhage, unspecified: Secondary | ICD-10-CM

## 2020-09-14 DIAGNOSIS — I10 Essential (primary) hypertension: Secondary | ICD-10-CM

## 2020-09-14 NOTE — Progress Notes (Signed)
Subjective:    Patient ID: Marcus Beasley, male    DOB: [birth year], 75 y.o.   MRN: [id number]  DOS:  09/14/2020 Type of visit - description: ER follow-up, here with his daughter Marcus Beasley to the ER 09/11/2020 after a fall. He was at home, trying to going upstairs while holding some belongings, fell and landed on his left side. Had a very thorough examination at the ER:  BMP, CBC, CT head, CT cervical CT chest abdomen and pelvis with no acute findings, did have cholelithiasis prostatomegaly.  Also an indeterminate subcentimeter hypodensity within the liver and spleen. Shoulder x-rays: DJD   Review of Systems Since he left the ER, no further falls. He is still very sore at the left side of the neck, left lateral chest and left shoulder.  No fever chills No cough No headache, dizziness or diplopia.  Past Medical History:  Diagnosis Date  . BPH (benign prostatic hyperplasia)    (-) Bx 2015  . Diabetes mellitus without complication ([place])   . Elevated PSA    Prostate Bx in 06/2013 was benign  . Glaucoma suspect   . HTN (hypertension)   . Hyperlipidemia   . Macular degeneration, age related     Past Surgical History:  Procedure Laterality Date  . ABCESS DRAINAGE     abdomen- 26 day hospitalization 1968  . COLONOSCOPY  2016  . HERNIA REPAIR  summer '11   umbilical, dr Ninfa Linden   . POLYPECTOMY    . PROSTATE BIOPSY  06-2013 , 07-2017   (-), (-)    Allergies as of 09/14/2020   No Known Allergies     Medication List       Accurate as of September 14, 2020 11:59 PM. If you have any questions, ask your nurse or doctor.        STOP taking these medications   traMADol 50 MG tablet Commonly known as: ULTRAM Stopped by: Kathlene November, MD     TAKE these medications   amLODipine 5 MG tablet Commonly known as: NORVASC Take 1 tablet (5 mg total) by mouth daily.   atorvastatin 80 MG tablet Commonly known as: LIPITOR Take 1 tablet (80 mg total) by mouth daily.   diltiazem 240 MG 24 hr  capsule Commonly known as: CARDIZEM CD TAKE 1 CAPSULE BY MOUTH EVERY DAY   Eliquis 5 MG Tabs tablet Generic drug: apixaban TAKE 1 TABLET BY MOUTH TWICE A DAY   finasteride 5 MG tablet Commonly known as: PROSCAR Take 5 mg by mouth daily.   meclizine 25 MG tablet Commonly known as: ANTIVERT Take 1 tablet (25 mg total) by mouth 2 (two) times daily as needed for dizziness.   metFORMIN 850 MG tablet Commonly known as: GLUCOPHAGE TAKE 1 TABLET (850 MG TOTAL) BY MOUTH 2 (TWO) TIMES DAILY WITH A MEAL.   metoprolol tartrate 50 MG tablet Commonly known as: LOPRESSOR TAKE 1 TABLET BY MOUTH TWICE A DAY   multivitamin with minerals Tabs tablet Take 1 tablet by mouth daily.   olopatadine 0.1 % ophthalmic solution Commonly known as: PATANOL SMARTSIG:In Eye(s)   OneTouch Verio test strip Generic drug: glucose blood CHECK BLOOD SUGAR NO MORE THAN TWICE DAILY   PRESERVISION/LUTEIN PO Take 1 tablet by mouth daily.   Restasis 0.05 % ophthalmic emulsion Generic drug: cycloSPORINE 1 drop 2 (two) times daily.          Objective:   Physical Exam BP (!) 142/74 (BP Location: Right Arm, Patient Position: Sitting, Cuff  Size: Small)   Pulse 73   Temp 98 F (36.7 C) (Oral)   Resp 18   Ht 5\' 11"  (1.803 m)   Wt 146 lb 8 oz (66.5 kg)   SpO2 94%   BMI 20.43 kg/m  General:   Well developed, NAD, BMI noted. HEENT:  Normocephalic . Face symmetric, atraumatic Lungs:  CTA B Normal respiratory effort, no intercostal retractions, no accessory muscle use. Heart: RRR,  no murmur.  Lower extremities: no pretibial edema bilaterally  Skin: Not pale. Not jaundice Neurologic:  alert & oriented X3.  Speech normal, gait: Assisted by a quad cane. MSK: Shoulders symmetric R shoulder range of motion acceptable L shoulder range of motion limited Chest wall: Moderately TTP at the left posterolateral chest Psych--  Cognition and judgment appear intact.  Cooperative with normal attention span  and concentration.  Behavior appropriate. No anxious or depressed appearing.      Assessment     Assessment DM  ---->  DX 07-2015, A1c 8.0; started metformin Neuropathy, mild.  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  Hep C serologies negative, hep B surface Ab+, hep B core antibody negative (consistent with vaccination)  PLAN Here with his daughter [name], subsequent encounter: Persistent symptoms are neck, shoulder and chest wall pain.  X-ray showed no acute bone findings. Taking Ultram, as prescribed at the ER, it causes dizziness and really does not help much the pain. Plan: Information about fall prevention provided Home PT referral to decrease risk of future fall, to help with the left shoulder pain   Stop Ultram, Tylenol as needed, no NSAIDs, he is anticoagulated. Hypodensities of the liver and spleen, cholelithiasis: Incidental findings, observation.   This visit occurred during the SARS-CoV-2 public health emergency.  Safety protocols were in place, including screening questions prior to the visit, additional usage of staff PPE, and extensive cleaning of exam room while observing appropriate contact time as indicated for disinfecting solutions.

## 2020-09-14 NOTE — Patient Instructions (Signed)
Stop tramadol (Ultram)  Instead, take Tylenol 500 mg 1 or 2 tablets 3 times a day as needed for pain.  Do not take ibuprofen, naproxen or any other anti-inflammatory   Fall Prevention in the Home, Adult Falls can cause injuries and can affect people from all age groups. There are many simple things that you can do to make your home safe and to help prevent falls. Ask for help when making these changes, if needed. What actions can I take to prevent falls? General instructions  Use good lighting in all rooms. Replace any light bulbs that burn out.  Turn on lights if it is dark. Use night-lights.  Place frequently used items in easy-to-reach places. Lower the shelves around your home if necessary.  Set up furniture so that there are clear paths around it. Avoid moving your furniture around.  Remove throw rugs and other tripping hazards from the floor.  Avoid walking on wet floors.  Fix any uneven floor surfaces.  Add color or contrast paint or tape to grab bars and handrails in your home. Place contrasting color strips on the first and last steps of stairways.  When you use a stepladder, make sure that it is completely opened and that the sides are firmly locked. Have someone hold the ladder while you are using it. Do not climb a closed stepladder.  Be aware of any and all pets. What can I do in the bathroom?  Keep the floor dry. Immediately clean up any water that spills onto the floor.  Remove soap buildup in the tub or shower on a regular basis.  Use non-skid mats or decals on the floor of the tub or shower.  Attach bath mats securely with double-sided, non-slip rug tape.  If you need to sit down while you are in the shower, use a plastic, non-slip stool.  Install grab bars by the toilet and in the tub and shower. Do not use towel bars as grab bars.      What can I do in the bedroom?  Make sure that a bedside light is easy to reach.  Do not use oversized bedding that  drapes onto the floor.  Have a firm chair that has side arms to use for getting dressed. What can I do in the kitchen?  Clean up any spills right away.  If you need to reach for something above you, use a sturdy step stool that has a grab bar.  Keep electrical cables out of the way.  Do not use floor polish or wax that makes floors slippery. If you must use wax, make sure that it is non-skid floor wax. What can I do in the stairways?  Do not leave any items on the stairs.  Make sure that you have a light switch at the top of the stairs and the bottom of the stairs. Have them installed if you do not have them.  Make sure that there are handrails on both sides of the stairs. Fix handrails that are broken or loose. Make sure that handrails are as long as the stairways.  Install non-slip stair treads on all stairs in your home.  Avoid having throw rugs at the top or bottom of stairways, or secure the rugs with carpet tape to prevent them from moving.  Choose a carpet design that does not hide the edge of steps on the stairway.  Check any carpeting to make sure that it is firmly attached to the stairs. Fix any carpet  that is loose or worn. What can I do on the outside of my home?  Use bright outdoor lighting.  Regularly repair the edges of walkways and driveways and fix any cracks.  Remove high doorway thresholds.  Trim any shrubbery on the main path into your home.  Regularly check that handrails are securely fastened and in good repair. Both sides of any steps should have handrails.  Install guardrails along the edges of any raised decks or porches.  Clear walkways of debris and clutter, including tools and rocks.  Have leaves, snow, and ice cleared regularly.  Use sand or salt on walkways during winter months.  In the garage, clean up any spills right away, including grease or oil spills. What other actions can I take?  Wear closed-toe shoes that fit well and support  your feet. Wear shoes that have rubber soles or low heels.  Use mobility aids as needed, such as canes, walkers, scooters, and crutches.  Review your medicines with your health care provider. Some medicines can cause dizziness or changes in blood pressure, which increase your risk of falling. Talk with your health care provider about other ways that you can decrease your risk of falls. This may include working with a physical therapist or trainer to improve your strength, balance, and endurance. Where to find more information  Centers for Disease Control and Prevention, STEADI: WebmailGuide.co.za  [employer] on Aging: BrainJudge.co.uk Contact a health care provider if:  You are afraid of falling at home.  You feel weak, drowsy, or dizzy at home.  You fall at home. Summary  There are many simple things that you can do to make your home safe and to help prevent falls.  Ways to make your home safe include removing tripping hazards and installing grab bars in the bathroom.  Ask for help when making these changes in your home. This information is not intended to replace advice given to you by your health care provider. Make sure you discuss any questions you have with your health care provider. Document Revised: 05/22/2017 Document Reviewed: 01/22/2017 Elsevier Patient Education  2021 [employer].

## 2020-09-16 NOTE — Assessment & Plan Note (Signed)
Here with his daughter Jeannine Kitten, subsequent encounter: Persistent symptoms are neck, shoulder and chest wall pain.  X-ray showed no acute bone findings. Taking Ultram, as prescribed at the ER, it causes dizziness and really does not help much the pain. Plan: Information about fall prevention provided Home PT referral to decrease risk of future fall, to help with the left shoulder pain   Stop Ultram, Tylenol as needed, no NSAIDs, he is anticoagulated. Hypodensities of the liver and spleen, cholelithiasis: Incidental findings, observation.

## 2020-09-17 ENCOUNTER — Ambulatory Visit (INDEPENDENT_AMBULATORY_CARE_PROVIDER_SITE_OTHER): Payer: Medicare Other | Admitting: Podiatry

## 2020-09-17 ENCOUNTER — Other Ambulatory Visit: Payer: Self-pay

## 2020-09-17 DIAGNOSIS — L84 Corns and callosities: Secondary | ICD-10-CM | POA: Diagnosis not present

## 2020-09-17 DIAGNOSIS — E1142 Type 2 diabetes mellitus with diabetic polyneuropathy: Secondary | ICD-10-CM | POA: Diagnosis not present

## 2020-09-17 DIAGNOSIS — M2041 Other hammer toe(s) (acquired), right foot: Secondary | ICD-10-CM | POA: Diagnosis not present

## 2020-09-17 DIAGNOSIS — M2042 Other hammer toe(s) (acquired), left foot: Secondary | ICD-10-CM | POA: Diagnosis not present

## 2020-09-17 DIAGNOSIS — M2012 Hallux valgus (acquired), left foot: Secondary | ICD-10-CM

## 2020-09-17 DIAGNOSIS — M21612 Bunion of left foot: Secondary | ICD-10-CM

## 2020-09-17 DIAGNOSIS — E119 Type 2 diabetes mellitus without complications: Secondary | ICD-10-CM

## 2020-09-17 NOTE — Progress Notes (Signed)
The patient presented to the office today to pick up diabetic shoes and 3 pair diabetic custom inserts.  1 pair of inserts were put in the shoes and the shoes were fitted to the patient. The patient stated they are comfortable and free of defect. He was satisfied with the fit of the shoe. Instructions for break in and wear were dispensed. The patient signed the delivery documentation and break in instruction form.  If any concerns or questions arise, he is instructed to call.

## 2020-09-18 ENCOUNTER — Telehealth: Payer: Self-pay | Admitting: Internal Medicine

## 2020-09-18 DIAGNOSIS — E119 Type 2 diabetes mellitus without complications: Secondary | ICD-10-CM | POA: Diagnosis not present

## 2020-09-18 DIAGNOSIS — M25512 Pain in left shoulder: Secondary | ICD-10-CM | POA: Diagnosis not present

## 2020-09-18 DIAGNOSIS — H353131 Nonexudative age-related macular degeneration, bilateral, early dry stage: Secondary | ICD-10-CM | POA: Diagnosis not present

## 2020-09-18 DIAGNOSIS — R269 Unspecified abnormalities of gait and mobility: Secondary | ICD-10-CM | POA: Diagnosis not present

## 2020-09-18 DIAGNOSIS — I10 Essential (primary) hypertension: Secondary | ICD-10-CM | POA: Diagnosis not present

## 2020-09-18 DIAGNOSIS — W19XXXD Unspecified fall, subsequent encounter: Secondary | ICD-10-CM | POA: Diagnosis not present

## 2020-09-18 DIAGNOSIS — Z7984 Long term (current) use of oral hypoglycemic drugs: Secondary | ICD-10-CM | POA: Diagnosis not present

## 2020-09-18 DIAGNOSIS — I69354 Hemiplegia and hemiparesis following cerebral infarction affecting left non-dominant side: Secondary | ICD-10-CM | POA: Diagnosis not present

## 2020-09-18 NOTE — Telephone Encounter (Signed)
Please advise 

## 2020-09-18 NOTE — Telephone Encounter (Signed)
Caller : [facility]  Call Back @ [phone], ok to lvm   Almyra is requesting a verbal order to add a OT eval for  Patient, patient is more interested in that for shoulder pain   Please advise

## 2020-09-18 NOTE — Telephone Encounter (Signed)
LMOM for Almira- verbal orders given.

## 2020-09-18 NOTE — Telephone Encounter (Signed)
Yes, thank you.

## 2020-09-19 ENCOUNTER — Telehealth: Payer: Self-pay | Admitting: Internal Medicine

## 2020-09-19 ENCOUNTER — Telehealth: Payer: Self-pay

## 2020-09-19 MED ORDER — MECLIZINE HCL 25 MG PO TABS: 25.0000 mg | ORAL_TABLET | Freq: Two times a day (BID) | ORAL | 1 refills | Status: DC | PRN

## 2020-09-19 NOTE — Telephone Encounter (Signed)
Medication: meclizine (ANTIVERT) 25 MG tablet [[id number]]      Has the patient contacted their pharmacy?  Yes, pharmacy said he hasnt had refill in awhile    Preferred Pharmacy (with phone number or street name): [facility] on [address] in [place]    Agent: Please be advised that RX refills may take up to 3 business days. We ask that you follow-up with your pharmacy.   Please advise

## 2020-09-19 NOTE — Telephone Encounter (Signed)
Patient assistance for Eliquis approved from 09/03/20 to 06/22/21.

## 2020-09-19 NOTE — Telephone Encounter (Signed)
Rx sent 

## 2020-09-20 ENCOUNTER — Telehealth: Payer: Self-pay

## 2020-09-20 DIAGNOSIS — E119 Type 2 diabetes mellitus without complications: Secondary | ICD-10-CM | POA: Diagnosis not present

## 2020-09-20 DIAGNOSIS — H353131 Nonexudative age-related macular degeneration, bilateral, early dry stage: Secondary | ICD-10-CM | POA: Diagnosis not present

## 2020-09-20 DIAGNOSIS — I10 Essential (primary) hypertension: Secondary | ICD-10-CM | POA: Diagnosis not present

## 2020-09-20 DIAGNOSIS — M25512 Pain in left shoulder: Secondary | ICD-10-CM | POA: Diagnosis not present

## 2020-09-20 DIAGNOSIS — R269 Unspecified abnormalities of gait and mobility: Secondary | ICD-10-CM | POA: Diagnosis not present

## 2020-09-20 DIAGNOSIS — W19XXXD Unspecified fall, subsequent encounter: Secondary | ICD-10-CM | POA: Diagnosis not present

## 2020-09-20 DIAGNOSIS — I69354 Hemiplegia and hemiparesis following cerebral infarction affecting left non-dominant side: Secondary | ICD-10-CM | POA: Diagnosis not present

## 2020-09-20 DIAGNOSIS — Z7984 Long term (current) use of oral hypoglycemic drugs: Secondary | ICD-10-CM | POA: Diagnosis not present

## 2020-09-20 NOTE — Telephone Encounter (Signed)
Received call from Will, OT at [facility], requesting OT 2x a week for 3 weeks. Verbal orders given .

## 2020-09-24 DIAGNOSIS — I10 Essential (primary) hypertension: Secondary | ICD-10-CM | POA: Diagnosis not present

## 2020-09-24 DIAGNOSIS — H353131 Nonexudative age-related macular degeneration, bilateral, early dry stage: Secondary | ICD-10-CM | POA: Diagnosis not present

## 2020-09-24 DIAGNOSIS — M25512 Pain in left shoulder: Secondary | ICD-10-CM | POA: Diagnosis not present

## 2020-09-24 DIAGNOSIS — E119 Type 2 diabetes mellitus without complications: Secondary | ICD-10-CM | POA: Diagnosis not present

## 2020-09-24 DIAGNOSIS — Z7984 Long term (current) use of oral hypoglycemic drugs: Secondary | ICD-10-CM | POA: Diagnosis not present

## 2020-09-24 DIAGNOSIS — I69354 Hemiplegia and hemiparesis following cerebral infarction affecting left non-dominant side: Secondary | ICD-10-CM | POA: Diagnosis not present

## 2020-09-24 DIAGNOSIS — W19XXXD Unspecified fall, subsequent encounter: Secondary | ICD-10-CM | POA: Diagnosis not present

## 2020-09-24 DIAGNOSIS — R269 Unspecified abnormalities of gait and mobility: Secondary | ICD-10-CM | POA: Diagnosis not present

## 2020-09-25 ENCOUNTER — Telehealth: Payer: Self-pay

## 2020-09-25 DIAGNOSIS — M25512 Pain in left shoulder: Secondary | ICD-10-CM

## 2020-09-25 DIAGNOSIS — Z7984 Long term (current) use of oral hypoglycemic drugs: Secondary | ICD-10-CM | POA: Diagnosis not present

## 2020-09-25 DIAGNOSIS — E119 Type 2 diabetes mellitus without complications: Secondary | ICD-10-CM | POA: Diagnosis not present

## 2020-09-25 DIAGNOSIS — W19XXXD Unspecified fall, subsequent encounter: Secondary | ICD-10-CM | POA: Diagnosis not present

## 2020-09-25 DIAGNOSIS — I10 Essential (primary) hypertension: Secondary | ICD-10-CM

## 2020-09-25 DIAGNOSIS — I69354 Hemiplegia and hemiparesis following cerebral infarction affecting left non-dominant side: Secondary | ICD-10-CM

## 2020-09-25 DIAGNOSIS — H353131 Nonexudative age-related macular degeneration, bilateral, early dry stage: Secondary | ICD-10-CM

## 2020-09-25 DIAGNOSIS — R269 Unspecified abnormalities of gait and mobility: Secondary | ICD-10-CM

## 2020-09-25 NOTE — Telephone Encounter (Signed)
Plan of care signed and faxed back to [facility] at [phone]. Form sent for scanning.  

## 2020-09-28 DIAGNOSIS — Z7984 Long term (current) use of oral hypoglycemic drugs: Secondary | ICD-10-CM | POA: Diagnosis not present

## 2020-09-28 DIAGNOSIS — I10 Essential (primary) hypertension: Secondary | ICD-10-CM | POA: Diagnosis not present

## 2020-09-28 DIAGNOSIS — W19XXXD Unspecified fall, subsequent encounter: Secondary | ICD-10-CM | POA: Diagnosis not present

## 2020-09-28 DIAGNOSIS — M25512 Pain in left shoulder: Secondary | ICD-10-CM | POA: Diagnosis not present

## 2020-09-28 DIAGNOSIS — I69354 Hemiplegia and hemiparesis following cerebral infarction affecting left non-dominant side: Secondary | ICD-10-CM | POA: Diagnosis not present

## 2020-09-28 DIAGNOSIS — H353131 Nonexudative age-related macular degeneration, bilateral, early dry stage: Secondary | ICD-10-CM | POA: Diagnosis not present

## 2020-09-28 DIAGNOSIS — R269 Unspecified abnormalities of gait and mobility: Secondary | ICD-10-CM | POA: Diagnosis not present

## 2020-09-28 DIAGNOSIS — E119 Type 2 diabetes mellitus without complications: Secondary | ICD-10-CM | POA: Diagnosis not present

## 2020-10-02 DIAGNOSIS — E119 Type 2 diabetes mellitus without complications: Secondary | ICD-10-CM | POA: Diagnosis not present

## 2020-10-02 DIAGNOSIS — W19XXXD Unspecified fall, subsequent encounter: Secondary | ICD-10-CM | POA: Diagnosis not present

## 2020-10-02 DIAGNOSIS — M25512 Pain in left shoulder: Secondary | ICD-10-CM | POA: Diagnosis not present

## 2020-10-02 DIAGNOSIS — I10 Essential (primary) hypertension: Secondary | ICD-10-CM | POA: Diagnosis not present

## 2020-10-02 DIAGNOSIS — H353131 Nonexudative age-related macular degeneration, bilateral, early dry stage: Secondary | ICD-10-CM | POA: Diagnosis not present

## 2020-10-02 DIAGNOSIS — I69354 Hemiplegia and hemiparesis following cerebral infarction affecting left non-dominant side: Secondary | ICD-10-CM | POA: Diagnosis not present

## 2020-10-02 DIAGNOSIS — R269 Unspecified abnormalities of gait and mobility: Secondary | ICD-10-CM | POA: Diagnosis not present

## 2020-10-02 DIAGNOSIS — Z7984 Long term (current) use of oral hypoglycemic drugs: Secondary | ICD-10-CM | POA: Diagnosis not present

## 2020-10-02 LAB — PSA: PSA: 3.88

## 2020-10-04 DIAGNOSIS — E119 Type 2 diabetes mellitus without complications: Secondary | ICD-10-CM | POA: Diagnosis not present

## 2020-10-04 DIAGNOSIS — R269 Unspecified abnormalities of gait and mobility: Secondary | ICD-10-CM | POA: Diagnosis not present

## 2020-10-04 DIAGNOSIS — I69354 Hemiplegia and hemiparesis following cerebral infarction affecting left non-dominant side: Secondary | ICD-10-CM | POA: Diagnosis not present

## 2020-10-04 DIAGNOSIS — W19XXXD Unspecified fall, subsequent encounter: Secondary | ICD-10-CM | POA: Diagnosis not present

## 2020-10-04 DIAGNOSIS — Z7984 Long term (current) use of oral hypoglycemic drugs: Secondary | ICD-10-CM | POA: Diagnosis not present

## 2020-10-04 DIAGNOSIS — I10 Essential (primary) hypertension: Secondary | ICD-10-CM | POA: Diagnosis not present

## 2020-10-04 DIAGNOSIS — M25512 Pain in left shoulder: Secondary | ICD-10-CM | POA: Diagnosis not present

## 2020-10-04 DIAGNOSIS — H353131 Nonexudative age-related macular degeneration, bilateral, early dry stage: Secondary | ICD-10-CM | POA: Diagnosis not present

## 2020-10-07 IMAGING — CT CT HEAD WITHOUT CONTRAST
3 series · 15 of 47 positions shown, 18 images · non-contrast
Comparison: CT head dated May 26, 2018.

CLINICAL DATA: Dizziness and weakness.

EXAM:
CT HEAD WITHOUT CONTRAST
TECHNIQUE: Contiguous axial images were obtained from the base of the skull
through the vertex without intravenous contrast.

[Series 2: head wo · axial · 0.47mm/px · z∈[+1494,+1619]mm · 9 of 30 slices shown, 12 images]
[im 3/30  brain]
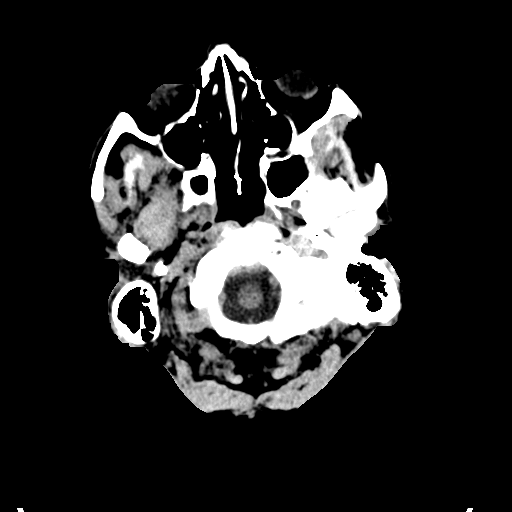
[im 3/30  bone]
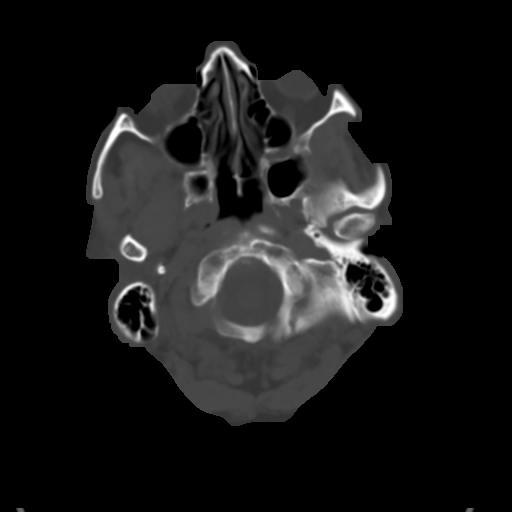
[im 6/30  brain]
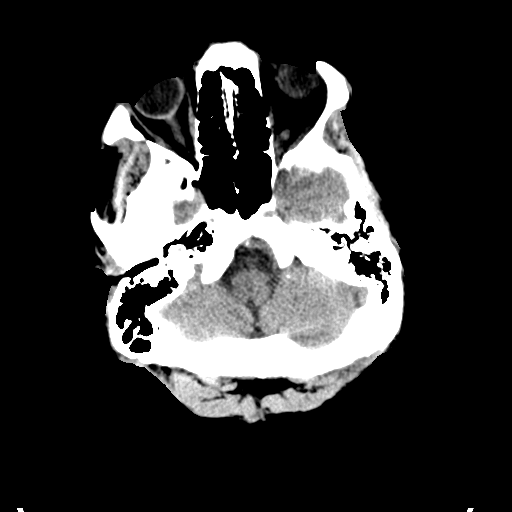
[im 9/30  brain]
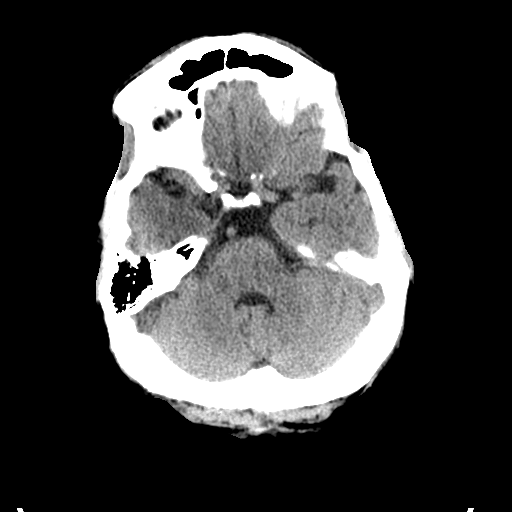
[im 12/30  brain]
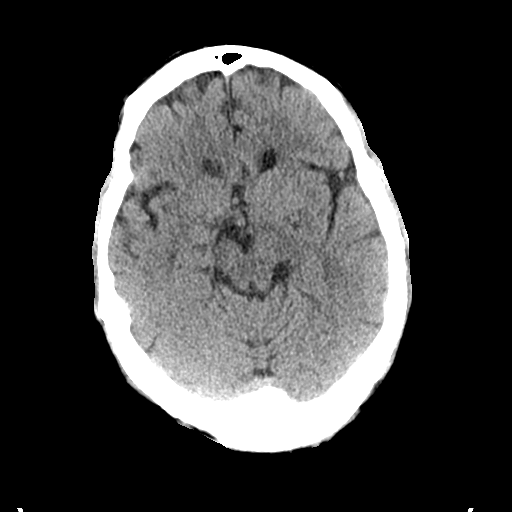
[im 16/30  brain]
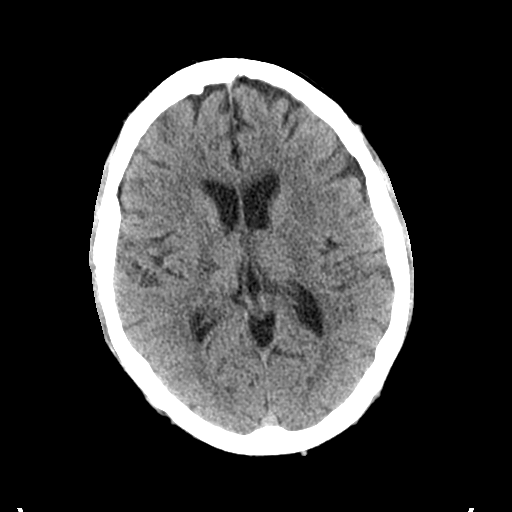
[im 16/30  bone]
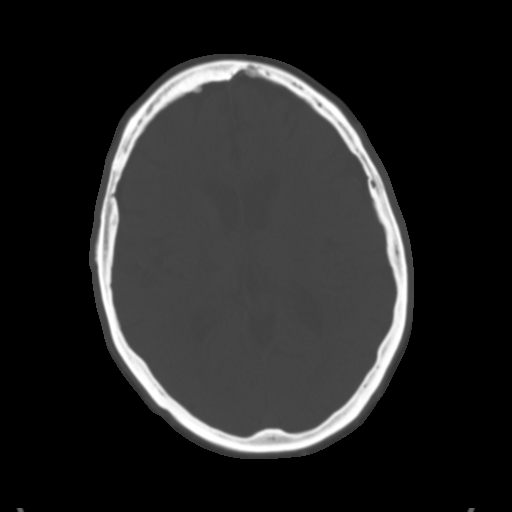
[im 19/30  brain]
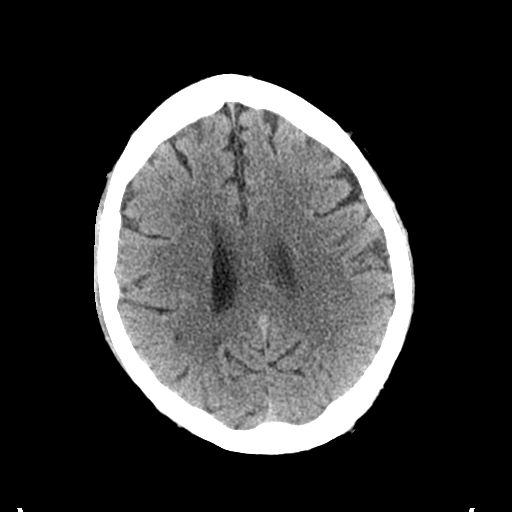
[im 22/30  brain]
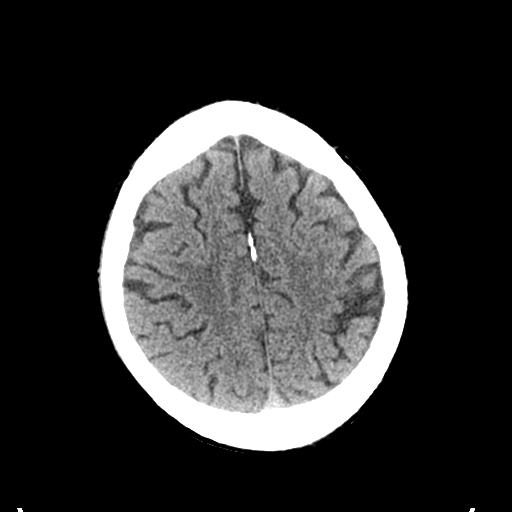
[im 25/30  brain]
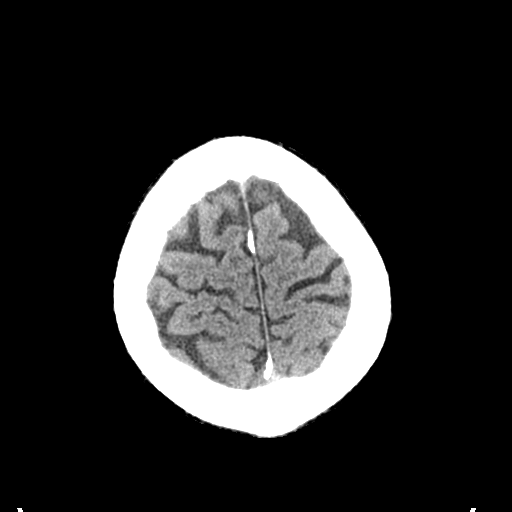
[im 28/30  brain]
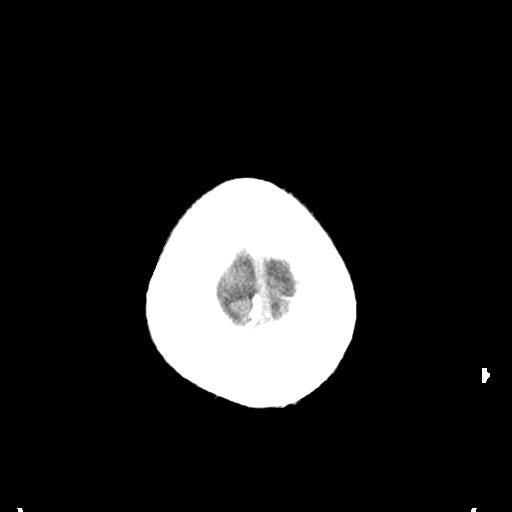
[im 28/30  bone]
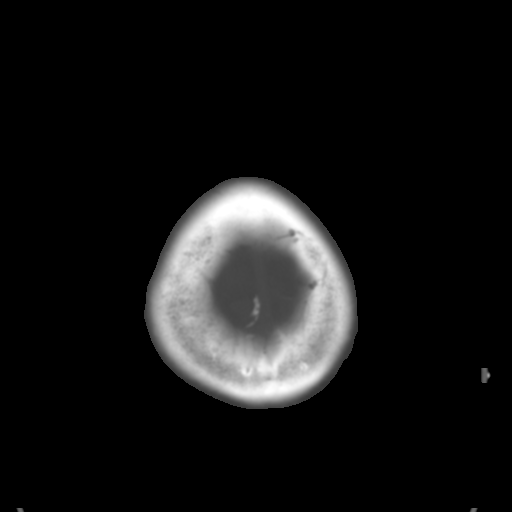

[Series 4: coronal soft tissue · coronal · 0.30mm/px · 3 of 69 slices shown]
[im 23/69  brain]
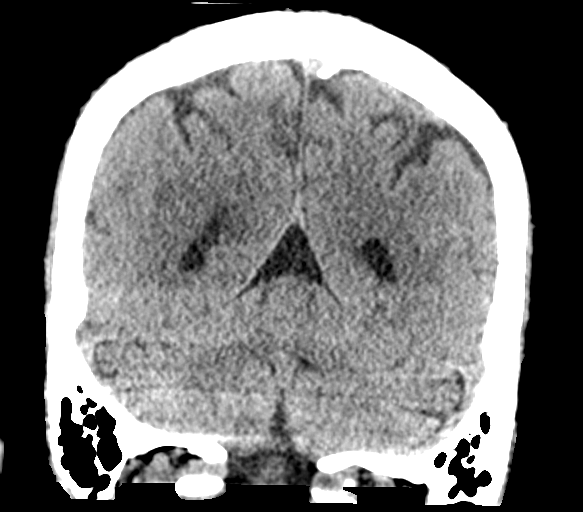
[im 31/69  brain]
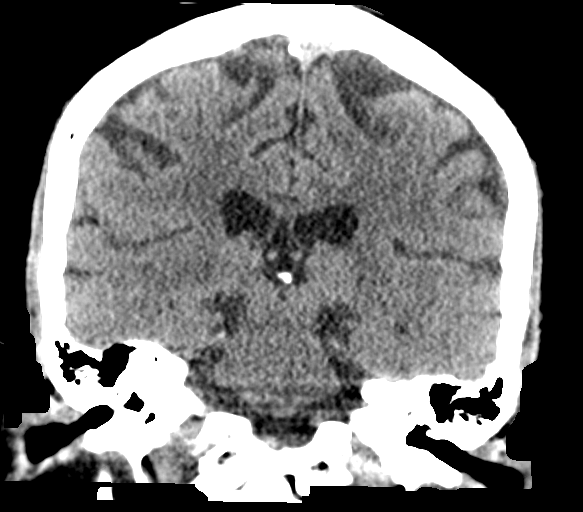
[im 38/69  brain]
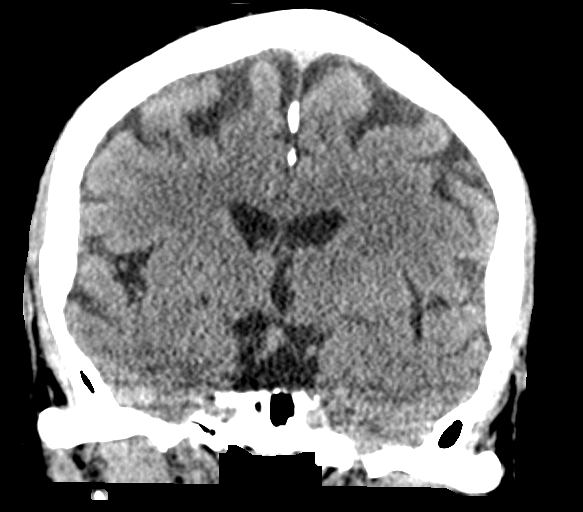

[Series 5: sagittal soft tissue · sagittal · 0.30mm/px · 3 of 54 slices shown]
[im 18/54  brain]
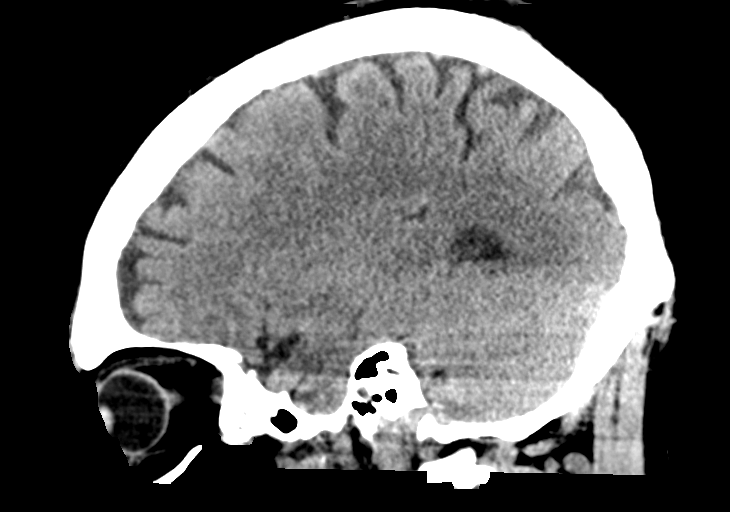
[im 27/54  brain]
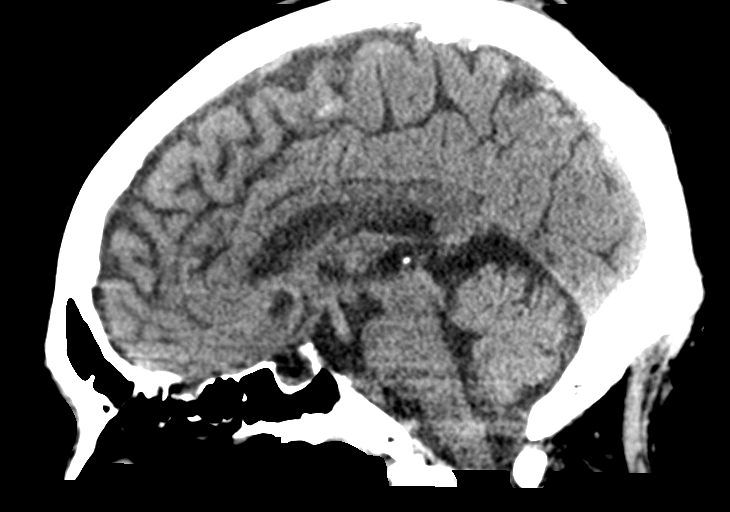
[im 36/54  brain]
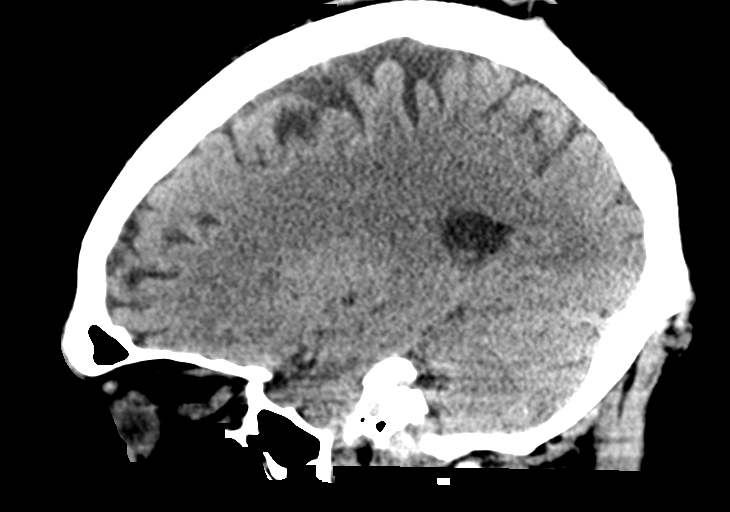

[15 of 47 positions shown; findings below may reference images not displayed]

FINDINGS: Brain: No evidence of acute infarction, hemorrhage, hydrocephalus,
extra-axial collection or mass lesion/mass effect. Old lacunar
infarct in the right thalamus again noted. Chronic appearing white
matter lacunar infarct in the right parietal lobe near the posterior
aspect of the right lateral ventricle, new since May 2018.

Vascular: No hyperdense vessel or unexpected calcification.

Skull: Normal. Negative for fracture or focal lesion.

Sinuses/Orbits: No acute finding.

Other: None.
IMPRESSION: 1.  No acute intracranial abnormality.
2. Old lacunar infarcts as above.

## 2020-10-09 DIAGNOSIS — Z7984 Long term (current) use of oral hypoglycemic drugs: Secondary | ICD-10-CM | POA: Diagnosis not present

## 2020-10-09 DIAGNOSIS — W19XXXD Unspecified fall, subsequent encounter: Secondary | ICD-10-CM | POA: Diagnosis not present

## 2020-10-09 DIAGNOSIS — I69354 Hemiplegia and hemiparesis following cerebral infarction affecting left non-dominant side: Secondary | ICD-10-CM | POA: Diagnosis not present

## 2020-10-09 DIAGNOSIS — R269 Unspecified abnormalities of gait and mobility: Secondary | ICD-10-CM | POA: Diagnosis not present

## 2020-10-09 DIAGNOSIS — H353131 Nonexudative age-related macular degeneration, bilateral, early dry stage: Secondary | ICD-10-CM | POA: Diagnosis not present

## 2020-10-09 DIAGNOSIS — M25512 Pain in left shoulder: Secondary | ICD-10-CM | POA: Diagnosis not present

## 2020-10-09 DIAGNOSIS — I10 Essential (primary) hypertension: Secondary | ICD-10-CM | POA: Diagnosis not present

## 2020-10-09 DIAGNOSIS — E119 Type 2 diabetes mellitus without complications: Secondary | ICD-10-CM | POA: Diagnosis not present

## 2020-10-11 ENCOUNTER — Telehealth: Payer: Self-pay

## 2020-10-11 DIAGNOSIS — I10 Essential (primary) hypertension: Secondary | ICD-10-CM | POA: Diagnosis not present

## 2020-10-11 DIAGNOSIS — E119 Type 2 diabetes mellitus without complications: Secondary | ICD-10-CM | POA: Diagnosis not present

## 2020-10-11 DIAGNOSIS — H353131 Nonexudative age-related macular degeneration, bilateral, early dry stage: Secondary | ICD-10-CM | POA: Diagnosis not present

## 2020-10-11 DIAGNOSIS — W19XXXD Unspecified fall, subsequent encounter: Secondary | ICD-10-CM | POA: Diagnosis not present

## 2020-10-11 DIAGNOSIS — Z7984 Long term (current) use of oral hypoglycemic drugs: Secondary | ICD-10-CM | POA: Diagnosis not present

## 2020-10-11 DIAGNOSIS — I69354 Hemiplegia and hemiparesis following cerebral infarction affecting left non-dominant side: Secondary | ICD-10-CM | POA: Diagnosis not present

## 2020-10-11 DIAGNOSIS — M25512 Pain in left shoulder: Secondary | ICD-10-CM | POA: Diagnosis not present

## 2020-10-11 DIAGNOSIS — R269 Unspecified abnormalities of gait and mobility: Secondary | ICD-10-CM | POA: Diagnosis not present

## 2020-10-11 NOTE — Telephone Encounter (Signed)
Spoke w/ Will-OT requesting to extend OT. Verbal order given.

## 2020-10-16 DIAGNOSIS — R269 Unspecified abnormalities of gait and mobility: Secondary | ICD-10-CM | POA: Diagnosis not present

## 2020-10-16 DIAGNOSIS — W19XXXD Unspecified fall, subsequent encounter: Secondary | ICD-10-CM | POA: Diagnosis not present

## 2020-10-16 DIAGNOSIS — I69354 Hemiplegia and hemiparesis following cerebral infarction affecting left non-dominant side: Secondary | ICD-10-CM | POA: Diagnosis not present

## 2020-10-16 DIAGNOSIS — H353131 Nonexudative age-related macular degeneration, bilateral, early dry stage: Secondary | ICD-10-CM | POA: Diagnosis not present

## 2020-10-16 DIAGNOSIS — M25512 Pain in left shoulder: Secondary | ICD-10-CM | POA: Diagnosis not present

## 2020-10-16 DIAGNOSIS — I10 Essential (primary) hypertension: Secondary | ICD-10-CM | POA: Diagnosis not present

## 2020-10-16 DIAGNOSIS — Z7984 Long term (current) use of oral hypoglycemic drugs: Secondary | ICD-10-CM | POA: Diagnosis not present

## 2020-10-16 DIAGNOSIS — E119 Type 2 diabetes mellitus without complications: Secondary | ICD-10-CM | POA: Diagnosis not present

## 2020-10-18 DIAGNOSIS — R269 Unspecified abnormalities of gait and mobility: Secondary | ICD-10-CM | POA: Diagnosis not present

## 2020-10-18 DIAGNOSIS — W19XXXD Unspecified fall, subsequent encounter: Secondary | ICD-10-CM | POA: Diagnosis not present

## 2020-10-18 DIAGNOSIS — E119 Type 2 diabetes mellitus without complications: Secondary | ICD-10-CM | POA: Diagnosis not present

## 2020-10-18 DIAGNOSIS — I69354 Hemiplegia and hemiparesis following cerebral infarction affecting left non-dominant side: Secondary | ICD-10-CM | POA: Diagnosis not present

## 2020-10-18 DIAGNOSIS — M25512 Pain in left shoulder: Secondary | ICD-10-CM | POA: Diagnosis not present

## 2020-10-18 DIAGNOSIS — H353131 Nonexudative age-related macular degeneration, bilateral, early dry stage: Secondary | ICD-10-CM | POA: Diagnosis not present

## 2020-10-18 DIAGNOSIS — I10 Essential (primary) hypertension: Secondary | ICD-10-CM | POA: Diagnosis not present

## 2020-10-18 DIAGNOSIS — Z7984 Long term (current) use of oral hypoglycemic drugs: Secondary | ICD-10-CM | POA: Diagnosis not present

## 2020-10-22 ENCOUNTER — Encounter: Payer: Self-pay | Admitting: Internal Medicine

## 2020-10-22 DIAGNOSIS — E119 Type 2 diabetes mellitus without complications: Secondary | ICD-10-CM | POA: Diagnosis not present

## 2020-10-22 DIAGNOSIS — W19XXXD Unspecified fall, subsequent encounter: Secondary | ICD-10-CM | POA: Diagnosis not present

## 2020-10-22 DIAGNOSIS — M25512 Pain in left shoulder: Secondary | ICD-10-CM | POA: Diagnosis not present

## 2020-10-22 DIAGNOSIS — Z7984 Long term (current) use of oral hypoglycemic drugs: Secondary | ICD-10-CM | POA: Diagnosis not present

## 2020-10-22 DIAGNOSIS — H353131 Nonexudative age-related macular degeneration, bilateral, early dry stage: Secondary | ICD-10-CM | POA: Diagnosis not present

## 2020-10-22 DIAGNOSIS — R269 Unspecified abnormalities of gait and mobility: Secondary | ICD-10-CM | POA: Diagnosis not present

## 2020-10-22 DIAGNOSIS — I69354 Hemiplegia and hemiparesis following cerebral infarction affecting left non-dominant side: Secondary | ICD-10-CM | POA: Diagnosis not present

## 2020-10-22 DIAGNOSIS — I10 Essential (primary) hypertension: Secondary | ICD-10-CM | POA: Diagnosis not present

## 2020-10-26 ENCOUNTER — Telehealth: Payer: Self-pay

## 2020-10-26 NOTE — Telephone Encounter (Signed)
Received call from Will, OT at [facility]- he is needing verbal orders to move discharge eval to next week. Verbal orders given,

## 2020-10-30 ENCOUNTER — Ambulatory Visit: Payer: Medicare Other | Admitting: Podiatry

## 2020-10-30 DIAGNOSIS — E119 Type 2 diabetes mellitus without complications: Secondary | ICD-10-CM | POA: Diagnosis not present

## 2020-10-30 DIAGNOSIS — W19XXXD Unspecified fall, subsequent encounter: Secondary | ICD-10-CM | POA: Diagnosis not present

## 2020-10-30 DIAGNOSIS — I10 Essential (primary) hypertension: Secondary | ICD-10-CM | POA: Diagnosis not present

## 2020-10-30 DIAGNOSIS — R269 Unspecified abnormalities of gait and mobility: Secondary | ICD-10-CM | POA: Diagnosis not present

## 2020-10-30 DIAGNOSIS — M25512 Pain in left shoulder: Secondary | ICD-10-CM | POA: Diagnosis not present

## 2020-10-30 DIAGNOSIS — Z7984 Long term (current) use of oral hypoglycemic drugs: Secondary | ICD-10-CM | POA: Diagnosis not present

## 2020-10-30 DIAGNOSIS — I69354 Hemiplegia and hemiparesis following cerebral infarction affecting left non-dominant side: Secondary | ICD-10-CM | POA: Diagnosis not present

## 2020-10-30 DIAGNOSIS — H353131 Nonexudative age-related macular degeneration, bilateral, early dry stage: Secondary | ICD-10-CM | POA: Diagnosis not present

## 2020-11-15 ENCOUNTER — Encounter: Payer: Medicare Other | Attending: Physical Medicine & Rehabilitation | Admitting: Physical Medicine & Rehabilitation

## 2020-11-15 ENCOUNTER — Other Ambulatory Visit: Payer: Self-pay

## 2020-11-15 ENCOUNTER — Encounter: Payer: Self-pay | Admitting: Physical Medicine & Rehabilitation

## 2020-11-15 VITALS — BP 127/77 | HR 66 | Temp 98.8°F | Ht 71.0 in | Wt 144.0 lb

## 2020-11-15 DIAGNOSIS — I69354 Hemiplegia and hemiparesis following cerebral infarction affecting left non-dominant side: Secondary | ICD-10-CM | POA: Insufficient documentation

## 2020-11-15 DIAGNOSIS — R269 Unspecified abnormalities of gait and mobility: Secondary | ICD-10-CM | POA: Insufficient documentation

## 2020-11-15 DIAGNOSIS — R208 Other disturbances of skin sensation: Secondary | ICD-10-CM | POA: Diagnosis not present

## 2020-11-15 DIAGNOSIS — G8110 Spastic hemiplegia affecting unspecified side: Secondary | ICD-10-CM | POA: Diagnosis not present

## 2020-11-15 NOTE — Progress Notes (Signed)
Subjective:    Patient ID: Marcus Beasley, male    DOB: 08-Oct-1945, 75 y.o.   MRN: [id number]  HPI:  Right-handed male with history of diabetes mellitus, hypertension presents for follow up for right thalamic hemorrhage, now with spasticity.   Last clinic visit 09/10/20. Since that time, pt states he is doing HEP. Patient states he still has some stiffness, but it does not limit his function. He states he had a fall trying to climb stairs with a tray.  He went to the ED, notes reviewed, unremarkable.   Pain Inventory Average Pain 0 Pain Right Now 0 My pain is constant, burning, dull and tingling  In the last 24 hours, has pain interfered with the following? General activity 3 Relation with others 3 Enjoyment of life 3 What TIME of day is your pain at its worst? daytime Sleep (in general) Fair  Pain is worse with: some activites Pain improves with: medication and injections Relief from Meds: 1  Family History  Problem Relation Age of Onset  . Leukemia Mother   . Heart attack Father        MI age 60  . Heart disease Sister        age 74  . Cancer - Other Sister        type  . Heart disease Brother        age 3  . Kidney disease Brother        HD  . Diabetes Maternal Grandmother   . Prostate cancer Neg Hx   . Colon cancer Neg Hx   . Esophageal cancer Neg Hx   . Rectal cancer Neg Hx   . Stomach cancer Neg Hx    Social History   Socioeconomic History  . Marital status: Married    Spouse name: Not on file  . Number of children: 3  . Years of education: 28  . Highest education level: Not on file  Occupational History  . 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 Smoking status: Never Smoker  . Smokeless tobacco: Never Used  Vaping Use  . Vaping Use: Never used  Substance and Sexual Activity  . Alcohol use: Yes    Comment: occ. beer  . Drug use: No  . Sexual activity: Yes    Partners: Female  Other Topics  Concern  . Not on file  Social History Narrative   [employer]. [employer]    Married - '69. 2 dtrs , 1 son - homicide. 5 grandchildren.     Household: pt, wife, daughter [name]) and son on-off    Daughter  [place] limited as off 06/2020    Social Determinants of Health   Financial Resource Strain: Not on file  Food Insecurity: Not on file  Transportation Needs: Not on file  Physical Activity: Not on file  Stress: Not on file  Social Connections: Not on file   Past Surgical History:  Procedure Laterality Date  . ABCESS DRAINAGE     abdomen- 26 day hospitalization 1968  . COLONOSCOPY  2016  . HERNIA REPAIR  summer '11   umbilical, dr Ninfa Linden   . POLYPECTOMY    . PROSTATE BIOPSY  06-2013 , 07-2017   (-), (-)   Past Medical History:  Diagnosis Date  . BPH (benign prostatic hyperplasia)    (-) Bx 2015  . Diabetes mellitus without complication ([place])   . Elevated PSA  Prostate Bx in 06/2013 was benign  . Glaucoma suspect   . HTN (hypertension)   . Hyperlipidemia   . Macular degeneration, age related    BP 127/77   Pulse 66   Temp 98.8 F (37.1 C)   Ht 5\' 11"  (1.803 m)   Wt 144 lb (65.3 kg)   SpO2 97%   BMI 20.08 kg/m   Opioid Risk Score:   Fall Risk Score:  `1  Depression screen PHQ 2/9  Depression screen [facility] 2/9 09/10/2020 03/27/2020 02/09/2020 12/19/2019 11/29/2019 11/11/2018 10/06/2018  Decreased Interest 0 0 0 0 0 0 0  Down, Depressed, Hopeless 0 0 0 0 0 0 0  PHQ - 2 Score 0 0 0 0 0 0 0  Some recent data might be hidden     Review of Systems  Constitutional: Negative.        Appetite poor and feels like he is still losing weight. (has not weighed since last visit)  HENT: Negative.   Eyes: Negative.   Respiratory: Negative.   Cardiovascular: Negative.   Gastrointestinal: Negative.   Endocrine: Negative.   Genitourinary: Negative.        Hesitancy  Musculoskeletal: Positive for arthralgias, gait problem and myalgias.  Skin: Negative.    Allergic/Immunologic: Negative.   Neurological: Positive for weakness and numbness.       Tingling  Hematological: Negative.        On eliquis  Psychiatric/Behavioral: Negative.   All other systems reviewed and are negative.     Objective:   Physical Exam  Constitutional: No distress . Vital signs reviewed. HENT: Normocephalic.  Atraumatic. Eyes: EOMI. No discharge. Cardiovascular: No JVD.   Respiratory: Normal effort.  No stridor.   GI: Non-distended.   Skin: Warm and dry.  Intact. Psych: Normal mood.  Normal behavior. Musc: No pain with ER/IR Left shoulder No pain with shoulder abduction >90 deg Gait: Left hemiparetic, stable Neuro: Alert Motor:  LUE: Shoulder abduction 4/5, elbow flex 4+/5, elbow extension 4+/5, hand grip 4+/5 with apraxia, stable LLE: HF, KE, ADF 4+/5, stable Mas: limited due to patient resistance, stable    Assessment & Plan:  Right-handed male with history of diabetes mellitus, hypertension presents for follow up for right thalamic hemorrhage, now with some spasticity.   1. 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              Shoulder xray reviewed showing shoulder OA             Sternoclavicular xray reviewed, unremarkable See #6  Supportive care now   2. Pain Management:  ?No benefit with Baclofen  No benefit with Elavil, d/ced             No benefit with Lyrica, d/ced  No benefit with Tizanidine, d/ced Tylenol as  needed D/ced Tramadol Cont ROM  Continue stretching  3. Gait abnormality  Continue HEP  Continue cane for safety  4. Post stroke shoulder pain Subacromial steroid injection under ulatrasoundwith benefit in pain and ROM             Intraarticular steroid  injection with benefit in ROM and "stiffness"  Controlled at present  5. Myalgia  Minimal benefit with trigger point injections  See #2  6. LUE dysesthesias and tightness  See #1  NCS/EMG showing polyneuropathy, as well as Left C6-T1 radiculopathy, Right C7-8 radiculopathy  Likely secondary to stroke - gave option of Botulinum toxin again, however, patient states can tolerate stiffness with ROM, will hold off at present  Supportive care now

## 2020-11-20 ENCOUNTER — Ambulatory Visit: Payer: Medicare Other | Admitting: Podiatry

## 2020-11-20 ENCOUNTER — Encounter: Payer: Self-pay | Admitting: Podiatry

## 2020-11-20 ENCOUNTER — Ambulatory Visit: Payer: Medicare Other | Admitting: Internal Medicine

## 2020-11-20 ENCOUNTER — Other Ambulatory Visit: Payer: Self-pay

## 2020-11-20 DIAGNOSIS — M79675 Pain in left toe(s): Secondary | ICD-10-CM | POA: Diagnosis not present

## 2020-11-20 DIAGNOSIS — M79674 Pain in right toe(s): Secondary | ICD-10-CM

## 2020-11-20 DIAGNOSIS — B351 Tinea unguium: Secondary | ICD-10-CM

## 2020-11-20 DIAGNOSIS — L84 Corns and callosities: Secondary | ICD-10-CM

## 2020-11-20 DIAGNOSIS — E119 Type 2 diabetes mellitus without complications: Secondary | ICD-10-CM | POA: Diagnosis not present

## 2020-11-20 NOTE — Progress Notes (Signed)
  Subjective:  Patient ID: Marcus Beasley, male    DOB: 1945/07/20,  MRN: [id number]  Chief Complaint  Patient presents with  . foot care    Diabetic foot care / callus trim    75 y.o. male returns with the above complaint. History confirmed with patient.   Objective:  Physical Exam: warm, good capillary refill, no trophic changes or ulcerative lesions, normal DP and PT pulses and normal sensory exam. Onychomycosis is noted to all toenails with brown discoloration, thickening the nail plate and subungual debris Left Foot:  L dorsal PIPJ corn fifth toe  right Foot: medial hallux pinch tyloma, submetatarsal 5  Assessment:   1. Type 2 diabetes mellitus without complication, without long-term current use of insulin (HCC)   2. Pain due to onychomycosis of toenails of both feet      Plan:  Patient was evaluated and treated and all questions answered.  Patient educated on diabetes. Discussed proper diabetic foot care and discussed risks and complications of disease. Educated patient in depth on reasons to return to the office immediately should he/she discover anything concerning or new on the feet. All questions answered. Discussed proper shoes as well.   Discussed the etiology and treatment options for the condition in detail with the patient. Educated patient on the topical and oral treatment options for mycotic nails. Recommended debridement of the nails today. Sharp and mechanical debridement performed of all painful and mycotic nails today. Nails debrided in length and thickness using a nail nipper and a mechanical burr to level of comfort. Discussed treatment options including appropriate shoe gear. Follow up as needed for painful nails.  All symptomatic hyperkeratoses were safely debrided with a sterile #15 blade to patient's level of comfort without incident. We discussed preventative and palliative care of these lesions including supportive and accommodative shoegear, padding,  prefabricated and custom molded accommodative orthoses, use of a pumice stone and lotions/creams daily.   Return in about 3 months (around 02/20/2021) for nail trim.

## 2020-12-11 DIAGNOSIS — H40013 Open angle with borderline findings, low risk, bilateral: Secondary | ICD-10-CM | POA: Diagnosis not present

## 2020-12-11 DIAGNOSIS — H40033 Anatomical narrow angle, bilateral: Secondary | ICD-10-CM | POA: Diagnosis not present

## 2020-12-12 ENCOUNTER — Other Ambulatory Visit: Payer: Self-pay | Admitting: Internal Medicine

## 2020-12-17 ENCOUNTER — Other Ambulatory Visit: Payer: Self-pay | Admitting: Internal Medicine

## 2020-12-17 ENCOUNTER — Other Ambulatory Visit: Payer: Self-pay

## 2020-12-17 ENCOUNTER — Ambulatory Visit (INDEPENDENT_AMBULATORY_CARE_PROVIDER_SITE_OTHER): Payer: Medicare Other | Admitting: Internal Medicine

## 2020-12-17 ENCOUNTER — Encounter: Payer: Self-pay | Admitting: Internal Medicine

## 2020-12-17 VITALS — BP 138/90 | HR 78 | Temp 98.0°F | Resp 18 | Ht 71.0 in | Wt 142.4 lb

## 2020-12-17 DIAGNOSIS — I10 Essential (primary) hypertension: Secondary | ICD-10-CM

## 2020-12-17 DIAGNOSIS — R634 Abnormal weight loss: Secondary | ICD-10-CM | POA: Diagnosis not present

## 2020-12-17 DIAGNOSIS — E1140 Type 2 diabetes mellitus with diabetic neuropathy, unspecified: Secondary | ICD-10-CM | POA: Diagnosis not present

## 2020-12-17 DIAGNOSIS — I6930 Unspecified sequelae of cerebral infarction: Secondary | ICD-10-CM

## 2020-12-17 LAB — HEMOGLOBIN A1C: Hgb A1c MFr Bld: 5.8 % (ref 4.6–6.5)

## 2020-12-17 LAB — TSH: TSH: 1.17 u[IU]/mL (ref 0.35–4.50)

## 2020-12-17 NOTE — Progress Notes (Signed)
Subjective:    Patient ID: Marcus Beasley, male    DOB: [birth year], 75 y.o.   MRN: [id number]  DOS:  12/17/2020 Type of visit - description: F/U, here by himself  Feels very well. He is still concerned about weight loss. "I do not eat like I used to".  Reports that is trying to eat healthy and has cut down on sugary foods. He specifically denies fever chills.  No night sweats.  No headaches No nausea, vomiting.  No abdominal pain or blood in the stools. Has mild dysphagia since he had a stroke. BMs typically twice daily.  Occasionally has diarrhea with certain foods.  Denies cough or gross hematuria No anxiety or depression No further falls  Wt Readings from Last 3 Encounters:  12/17/20 142 lb 6.4 oz (64.6 kg)  11/15/20 144 lb (65.3 kg)  09/14/20 146 lb 8 oz (66.5 kg)    BP Readings from Last 3 Encounters:  12/17/20 138/90  11/15/20 127/77  09/14/20 (!) 142/74      Review of Systems See above   Past Medical History:  Diagnosis Date   BPH (benign prostatic hyperplasia)    (-) Bx 2015   Diabetes mellitus without complication (HCC)    Elevated PSA    Prostate Bx in 06/2013 was benign   Glaucoma suspect    HTN (hypertension)    Hyperlipidemia    Macular degeneration, age related     Past Surgical History:  Procedure Laterality Date   ABCESS DRAINAGE     abdomen- 26 day hospitalization 1968   COLONOSCOPY  2016   HERNIA REPAIR  summer '11   umbilical, dr Ninfa Linden    POLYPECTOMY     PROSTATE BIOPSY  06-2013 , 07-2017   (-), (-)    Allergies as of 12/17/2020   No Known Allergies      Medication List        Accurate as of December 17, 2020 10:11 PM. If you have any questions, ask your nurse or doctor.          amLODipine 5 MG tablet Commonly known as: NORVASC Take 1 tablet (5 mg total) by mouth daily.   atorvastatin 80 MG tablet Commonly known as: LIPITOR Take 1 tablet (80 mg total) by mouth daily.   diltiazem 240 MG 24 hr capsule Commonly known  as: CARDIZEM CD TAKE 1 CAPSULE BY MOUTH EVERY DAY   Eliquis 5 MG Tabs tablet Generic drug: apixaban TAKE 1 TABLET BY MOUTH TWICE A DAY   finasteride 5 MG tablet Commonly known as: PROSCAR Take 5 mg by mouth daily.   meclizine 25 MG tablet Commonly known as: ANTIVERT Take 1 tablet (25 mg total) by mouth 2 (two) times daily as needed for dizziness.   metFORMIN 850 MG tablet Commonly known as: GLUCOPHAGE Take 1 tablet (850 mg total) by mouth 2 (two) times daily with a meal.   metoprolol tartrate 50 MG tablet Commonly known as: LOPRESSOR TAKE 1 TABLET BY MOUTH TWICE A DAY   multivitamin with minerals Tabs tablet Take 1 tablet by mouth daily.   olopatadine 0.1 % ophthalmic solution Commonly known as: PATANOL SMARTSIG:In Eye(s)   OneTouch Verio test strip Generic drug: glucose blood CHECK BLOOD SUGAR NO MORE THAN TWICE DAILY   PRESERVISION/LUTEIN PO Take 1 tablet by mouth daily.   Restasis 0.05 % ophthalmic emulsion Generic drug: cycloSPORINE 1 drop 2 (two) times daily.           Objective:  Physical Exam BP 138/90 (BP Location: Right Arm, Patient Position: Sitting, Cuff Size: Normal)   Pulse 78   Temp 98 F (36.7 C) (Oral)   Resp 18   Ht 5\' 11"  (1.803 m)   Wt 142 lb 6.4 oz (64.6 kg)   SpO2 98%   BMI 19.86 kg/m  General:   Well developed, NAD, BMI noted. HEENT:  Normocephalic . Face symmetric, atraumatic Lungs:  CTA B Normal respiratory effort, no intercostal retractions, no accessory muscle use. Heart: RRR,  no murmur. Abdomen, soft, nontender Lower extremities: no pretibial edema bilaterally  Skin: Not pale. Not jaundice Neurologic:  alert & oriented X3.  Speech normal, gait and transferring: Assisted by a cane, consistent with left hemiparesis. Psych--  Cognition and judgment appear intact.  Cooperative with normal attention span and concentration.  Behavior appropriate. No anxious or depressed appearing.      Assessment    Assessment DM   ---->  DX 07-2015, A1c 8.0; started metformin Neuropathy, mild.  DX 08-2017 (in sensitive type) HTN   Hyperlipidemia UROLOGY: BPH, increased PSA. 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  Hep C serologies negative, hep B surface Ab+, hep B core antibody negative (consistent with vaccination)  PLAN DM: On metformin, checking labs HTN: Seems okay, continue amlodipine, Cardizem, metoprolol. Weight loss: Approximately 38 pounds in 1 year, ROS is benign, see above.  He looks slightly underweight but healthy.  He admits is eating a much healthier diet.  Encouraged to continue with a healthy diet but get some nutrition supplements such as boost or Ensure. Up to date on colon cancer screening, for completeness we will check a TSH Stroke sequela: Saw neurology 11/15/2020, Rx Botox for spastic hemiparesis Social: Still doing some driving RTC 3 m  This visit occurred during the SARS-CoV-2 public health emergency.  Safety protocols were in place, including screening questions prior to the visit, additional usage of staff PPE, and extensive cleaning of exam room while observing appropriate contact time as indicated for disinfecting solutions.

## 2020-12-17 NOTE — Assessment & Plan Note (Signed)
DM: On metformin, checking labs HTN: Seems okay, continue amlodipine, Cardizem, metoprolol. Weight loss: Approximately 38 pounds in 1 year, ROS is benign, see above.  He looks slightly underweight but healthy.  He admits is eating a much healthier diet.  Encouraged to continue with a healthy diet but get some nutrition supplements such as boost or Ensure. 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

## 2020-12-17 NOTE — Patient Instructions (Addendum)
°  GO TO THE LAB : Get the blood work   ° ° °GO TO THE FRONT DESK, PLEASE SCHEDULE YOUR APPOINTMENTS °Come back for a checkup in 3 months °

## 2021-02-21 ENCOUNTER — Ambulatory Visit (INDEPENDENT_AMBULATORY_CARE_PROVIDER_SITE_OTHER): Payer: Medicare Other | Admitting: Podiatry

## 2021-02-21 ENCOUNTER — Other Ambulatory Visit: Payer: Self-pay

## 2021-02-21 DIAGNOSIS — B351 Tinea unguium: Secondary | ICD-10-CM

## 2021-02-21 DIAGNOSIS — M79675 Pain in left toe(s): Secondary | ICD-10-CM | POA: Diagnosis not present

## 2021-02-21 DIAGNOSIS — E119 Type 2 diabetes mellitus without complications: Secondary | ICD-10-CM | POA: Diagnosis not present

## 2021-02-21 DIAGNOSIS — L84 Corns and callosities: Secondary | ICD-10-CM | POA: Diagnosis not present

## 2021-02-21 DIAGNOSIS — M79674 Pain in right toe(s): Secondary | ICD-10-CM

## 2021-02-26 NOTE — Progress Notes (Signed)
  Subjective:  Patient ID: Marcus Beasley, male    DOB: Aug 21, 1945,  MRN: [id number]  Chief Complaint  Patient presents with   Nail Problem    Thick painful toenails, 3 month follow up   Diabetes    A1C  5.8    75 y.o. male returns with the above complaint. History confirmed with patient.   Objective:  Physical Exam: warm, good capillary refill, no trophic changes or ulcerative lesions, normal DP and PT pulses and normal sensory exam. Onychomycosis is noted to all toenails with brown discoloration, thickening the nail plate and subungual debris Left Foot:  L dorsal PIPJ corn fifth toe  right Foot: medial hallux pinch tyloma, submetatarsal 5  Assessment:   1. Pain due to onychomycosis of toenails of both feet   2. Type 2 diabetes mellitus without complication, without long-term current use of insulin (HCC)   3. Callus of foot       Plan:  Patient was evaluated and treated and all questions answered.  Patient educated on diabetes. Discussed proper diabetic foot care and discussed risks and complications of disease. Educated patient in depth on reasons to return to the office immediately should he/she discover anything concerning or new on the feet. All questions answered. Discussed proper shoes as well.   Discussed the etiology and treatment options for the condition in detail with the patient. Educated patient on the topical and oral treatment options for mycotic nails. Recommended debridement of the nails today. Sharp and mechanical debridement performed of all painful and mycotic nails today. Nails debrided in length and thickness using a nail nipper and a mechanical burr to level of comfort. Discussed treatment options including appropriate shoe gear. Follow up as needed for painful nails.  All symptomatic hyperkeratoses were safely debrided with a sterile #15 blade to patient's level of comfort without incident. We discussed preventative and palliative care of these lesions  including supportive and accommodative shoegear, padding, prefabricated and custom molded accommodative orthoses, use of a pumice stone and lotions/creams daily.   Return in about 3 months (around 05/23/2021) for at risk diabetic foot care.

## 2021-03-06 ENCOUNTER — Other Ambulatory Visit: Payer: Self-pay

## 2021-03-06 ENCOUNTER — Ambulatory Visit (INDEPENDENT_AMBULATORY_CARE_PROVIDER_SITE_OTHER): Payer: Medicare Other | Admitting: Internal Medicine

## 2021-03-06 ENCOUNTER — Encounter: Payer: Self-pay | Admitting: Internal Medicine

## 2021-03-06 VITALS — BP 136/84 | HR 66 | Temp 98.1°F | Resp 18 | Ht 71.0 in | Wt 144.4 lb

## 2021-03-06 DIAGNOSIS — E785 Hyperlipidemia, unspecified: Secondary | ICD-10-CM

## 2021-03-06 DIAGNOSIS — I10 Essential (primary) hypertension: Secondary | ICD-10-CM

## 2021-03-06 DIAGNOSIS — R0989 Other specified symptoms and signs involving the circulatory and respiratory systems: Secondary | ICD-10-CM | POA: Diagnosis not present

## 2021-03-06 DIAGNOSIS — Z0100 Encounter for examination of eyes and vision without abnormal findings: Secondary | ICD-10-CM

## 2021-03-06 DIAGNOSIS — Z23 Encounter for immunization: Secondary | ICD-10-CM | POA: Diagnosis not present

## 2021-03-06 DIAGNOSIS — E119 Type 2 diabetes mellitus without complications: Secondary | ICD-10-CM

## 2021-03-06 LAB — COMPREHENSIVE METABOLIC PANEL
ALT: 15 U/L (ref 0–53)
AST: 15 U/L (ref 0–37)
Albumin: 4.2 g/dL (ref 3.5–5.2)
Alkaline Phosphatase: 65 U/L (ref 39–117)
BUN: 15 mg/dL (ref 6–23)
CO2: 26 mEq/L (ref 19–32)
Calcium: 9.5 mg/dL (ref 8.4–10.5)
Chloride: 106 mEq/L (ref 96–112)
Creatinine, Ser: 0.81 mg/dL (ref 0.40–1.50)
GFR: 85.62 mL/min (ref >60.00)
Glucose, Bld: 79 mg/dL (ref 70–99)
Potassium: 4.4 mEq/L (ref 3.5–5.1)
Sodium: 141 mEq/L (ref 135–145)
Total Bilirubin: 0.6 mg/dL (ref 0.2–1.2)
Total Protein: 6.6 g/dL (ref 6.0–8.3)

## 2021-03-06 LAB — LIPID PANEL
Cholesterol: 106 mg/dL (ref 0–200)
HDL: 48.6 mg/dL (ref >39.00)
LDL Cholesterol: 48 mg/dL (ref 0–99)
NonHDL: 57.35
Total CHOL/HDL Ratio: 2
Triglycerides: 49 mg/dL (ref 0.0–149.0)
VLDL: 9.8 mg/dL (ref 0.0–40.0)

## 2021-03-06 LAB — MICROALBUMIN / CREATININE URINE RATIO
Creatinine,U: 60.3 mg/dL
Microalb Creat Ratio: 1.2 mg/g (ref 0.0–30.0)
Microalb, Ur: <0.7 mg/dL (ref 0.0–1.9)

## 2021-03-06 NOTE — Progress Notes (Signed)
Subjective:    Patient ID: Marcus Beasley, male    DOB: 1946-03-07, 75 y.o.   MRN: [id number]  DOS:  03/06/2021 Type of visit - description: f/u Since the last office visit he is doing well. Weight noted to be stable.   Wt Readings from Last 3 Encounters:  03/06/21 144 lb 6 oz (65.5 kg)  12/17/20 142 lb 6.4 oz (64.6 kg)  11/15/20 144 lb (65.3 kg)    Review of Systems Denies nausea vomiting. Very seldom has diarrhea, mostly food related  Past Medical History:  Diagnosis Date   BPH (benign prostatic hyperplasia)    (-) Bx 2015   Diabetes mellitus without complication (HCC)    Elevated PSA    Prostate Bx in 06/2013 was benign   Glaucoma suspect    HTN (hypertension)    Hyperlipidemia    Macular degeneration, age related     Past Surgical History:  Procedure Laterality Date   ABCESS DRAINAGE     abdomen- 26 day hospitalization 1968   COLONOSCOPY  2016   HERNIA REPAIR  summer '11   umbilical, dr Ninfa Linden    POLYPECTOMY     PROSTATE BIOPSY  06-2013 , 07-2017   (-), (-)    Allergies as of 03/06/2021   No Known Allergies      Medication List        Accurate as of March 06, 2021 11:59 PM. If you have any questions, ask your nurse or doctor.          amLODipine 5 MG tablet Commonly known as: NORVASC Take 1 tablet (5 mg total) by mouth daily.   atorvastatin 80 MG tablet Commonly known as: LIPITOR Take 1 tablet (80 mg total) by mouth daily.   diltiazem 240 MG 24 hr capsule Commonly known as: CARDIZEM CD TAKE 1 CAPSULE BY MOUTH EVERY DAY   Eliquis 5 MG Tabs tablet Generic drug: apixaban TAKE 1 TABLET BY MOUTH TWICE A DAY   finasteride 5 MG tablet Commonly known as: PROSCAR Take 5 mg by mouth daily.   meclizine 25 MG tablet Commonly known as: ANTIVERT Take 1 tablet (25 mg total) by mouth 2 (two) times daily as needed for dizziness.   metFORMIN 850 MG tablet Commonly known as: GLUCOPHAGE Take 1 tablet (850 mg total) by mouth 2 (two) times daily  with a meal.   metoprolol tartrate 50 MG tablet Commonly known as: LOPRESSOR TAKE 1 TABLET BY MOUTH TWICE A DAY   multivitamin with minerals Tabs tablet Take 1 tablet by mouth daily.   olopatadine 0.1 % ophthalmic solution Commonly known as: PATANOL SMARTSIG:In Eye(s)   OneTouch Verio test strip Generic drug: glucose blood CHECK BLOOD SUGAR NO MORE THAN TWICE DAILY   PRESERVISION/LUTEIN PO Take 1 tablet by mouth daily.   Restasis 0.05 % ophthalmic emulsion Generic drug: cycloSPORINE 1 drop 2 (two) times daily.           Objective:   Physical Exam BP 136/84 (BP Location: Left Arm, Patient Position: Sitting, Cuff Size: Small)   Pulse 66   Temp 98.1 F (36.7 C) (Oral)   Resp 18   Ht '5\' 11"'$  (1.803 m)   Wt 144 lb 6 oz (65.5 kg)   SpO2 97%   BMI 20.14 kg/m  General:   Well developed, NAD, slightly underweight appearing but otherwise she looks well HEENT:  Normocephalic . Face symmetric, atraumatic Lungs:  CTA B Normal respiratory effort, no intercostal retractions, no accessory muscle use. Heart:  RRR,  no murmur.  Lower extremities: no pretibial edema bilaterally  Skin: Not pale. Not jaundice Neurologic:  alert & oriented X3.  Speech normal, gait at baseline, assisted by a cane  psych--  Cognition and judgment appear intact.  Cooperative with normal attention span and concentration.  Behavior appropriate. No anxious or depressed appearing.      Assessment    Assessment DM  ---->  DX 07-2015, A1c 8.0; started metformin Neuropathy, mild.  DX 08-2017 (in sensitive type) HTN   Hyperlipidemia UROLOGY: BPH, increased PSA. 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  Hep C serologies negative, hep B surface Ab+, hep B core antibody negative (consistent with vaccination)  PLAN DM: On metformin, last A1c very good. HTN BP  today is very good, reports good ambulatory BPs, continue amlodipine, Cardizem, metoprolol.  Check CMP High cholesterol: Based on last FLP, Lipitor was increased to 80.  Check labs Weight loss: Weight has stabilized, TSH was normal Preventive care: Flu shot today, COVID booster at his convenience RTC CPX 4 months     This visit occurred during the SARS-CoV-2 public health emergency.  Safety protocols were in place, including screening questions prior to the visit, additional usage of staff PPE, and extensive cleaning of exam room while observing appropriate contact time as indicated for disinfecting solutions.

## 2021-03-06 NOTE — Patient Instructions (Addendum)
Recommend to proceed with the following vaccines at your pharmacy:  Shingrix (shingles) #2 Covid #4  We have referred you back to [facility] for your diabetic eye exam. Please expect a call from them to schedule an appointment at your convenience.      GO TO THE LAB : Get the blood work     GO TO THE FRONT DESK, [place] Come back for a physical exam in 4 months

## 2021-03-07 NOTE — Assessment & Plan Note (Signed)
DM: On metformin, last A1c very good. HTN BP today is very good, reports good ambulatory BPs, continue amlodipine, Cardizem, metoprolol.  Check CMP High cholesterol: Based on last FLP, Lipitor was increased to 80.  Check labs Weight loss: Weight has stabilized, TSH was normal Preventive care: Flu shot today, COVID booster at his convenience RTC CPX 4 months

## 2021-03-08 ENCOUNTER — Other Ambulatory Visit: Payer: Self-pay | Admitting: Internal Medicine

## 2021-03-12 ENCOUNTER — Ambulatory Visit: Payer: Medicare Other | Admitting: Internal Medicine

## 2021-03-12 ENCOUNTER — Ambulatory Visit: Payer: Medicare Other | Admitting: Physical Medicine & Rehabilitation

## 2021-03-13 ENCOUNTER — Other Ambulatory Visit: Payer: Self-pay | Admitting: Cardiology

## 2021-03-13 NOTE — Telephone Encounter (Signed)
Prescription refill request for Eliquis received. Indication:afib Last office visit:jordan 07/18/20 Scr: 0.81 03/06/21 Age:10m Weight:65.5kg

## 2021-03-16 ENCOUNTER — Other Ambulatory Visit: Payer: Self-pay | Admitting: Cardiology

## 2021-05-23 ENCOUNTER — Other Ambulatory Visit: Payer: Self-pay

## 2021-05-23 ENCOUNTER — Ambulatory Visit (INDEPENDENT_AMBULATORY_CARE_PROVIDER_SITE_OTHER): Payer: Medicare Other | Admitting: Podiatry

## 2021-05-23 DIAGNOSIS — E119 Type 2 diabetes mellitus without complications: Secondary | ICD-10-CM

## 2021-05-23 DIAGNOSIS — M79675 Pain in left toe(s): Secondary | ICD-10-CM | POA: Diagnosis not present

## 2021-05-23 DIAGNOSIS — L84 Corns and callosities: Secondary | ICD-10-CM

## 2021-05-23 DIAGNOSIS — M79674 Pain in right toe(s): Secondary | ICD-10-CM | POA: Diagnosis not present

## 2021-05-23 DIAGNOSIS — B351 Tinea unguium: Secondary | ICD-10-CM

## 2021-05-23 NOTE — Progress Notes (Signed)
  Subjective:  Patient ID: Marcus Beasley, male    DOB: August 21, 1945,  MRN: [id number]  Chief Complaint  Patient presents with   Nail Problem    Thick painful toenails, 3 month follow up    75 y.o. male returns with the above complaint. History confirmed with patient.  Overall doing well no new issues  Objective:  Physical Exam: warm, good capillary refill, no trophic changes or ulcerative lesions, normal DP and PT pulses and normal sensory exam. Onychomycosis is noted to all toenails with brown discoloration, thickening the nail plate and subungual debris Left Foot:  L dorsal PIPJ corn fifth toe  right Foot: medial hallux pinch tyloma, submetatarsal 5  Assessment:   1. Pain due to onychomycosis of toenails of both feet   2. Callus of foot   3. Type 2 diabetes mellitus without complication, without long-term current use of insulin ([place])       Plan:  Patient was evaluated and treated and all questions answered.  Patient educated on diabetes. Discussed proper diabetic foot care and discussed risks and complications of disease. Educated patient in depth on reasons to return to the office immediately should he/she discover anything concerning or new on the feet. All questions answered. Discussed proper shoes as well.   Discussed the etiology and treatment options for the condition in detail with the patient. Educated patient on the topical and oral treatment options for mycotic nails. Recommended debridement of the nails today. Sharp and mechanical debridement performed of all painful and mycotic nails today. Nails debrided in length and thickness using a nail nipper and a mechanical burr to level of comfort. Discussed treatment options including appropriate shoe gear. Follow up as needed for painful nails.  All symptomatic hyperkeratoses were safely debrided with a sterile #15 blade to patient's level of comfort without incident. We discussed preventative and palliative care of these  lesions including supportive and accommodative shoegear, padding, prefabricated and custom molded accommodative orthoses, use of a pumice stone and lotions/creams daily.   Return in about 3 months (around 08/21/2021) for at risk diabetic foot care.

## 2021-06-07 ENCOUNTER — Other Ambulatory Visit: Payer: Self-pay | Admitting: Internal Medicine

## 2021-06-12 DIAGNOSIS — H353131 Nonexudative age-related macular degeneration, bilateral, early dry stage: Secondary | ICD-10-CM | POA: Diagnosis not present

## 2021-06-12 DIAGNOSIS — H40013 Open angle with borderline findings, low risk, bilateral: Secondary | ICD-10-CM | POA: Diagnosis not present

## 2021-06-12 DIAGNOSIS — H25813 Combined forms of age-related cataract, bilateral: Secondary | ICD-10-CM | POA: Diagnosis not present

## 2021-06-12 DIAGNOSIS — E119 Type 2 diabetes mellitus without complications: Secondary | ICD-10-CM | POA: Diagnosis not present

## 2021-06-12 DIAGNOSIS — H35033 Hypertensive retinopathy, bilateral: Secondary | ICD-10-CM | POA: Diagnosis not present

## 2021-06-12 DIAGNOSIS — H40033 Anatomical narrow angle, bilateral: Secondary | ICD-10-CM | POA: Diagnosis not present

## 2021-06-12 LAB — HM DIABETES EYE EXAM

## 2021-06-13 ENCOUNTER — Encounter: Payer: Self-pay | Admitting: Internal Medicine

## 2021-06-20 ENCOUNTER — Other Ambulatory Visit: Payer: Self-pay | Admitting: Internal Medicine

## 2021-07-03 NOTE — Progress Notes (Signed)
Cardiology Office Note    Date:  07/17/2021   ID:  Marcus, Beasley 22-Sep-1945, MRN [id number]  PCP:  Colon Branch, MD  Cardiologist:  Dr. Martinique  Chief Complaint  Patient presents with   Atrial Fibrillation    History of Present Illness:  Marcus Beasley is a 75 y.o. male seen for follow up Afib, HTN and prior hemorrhagic CVA. He has a  past medical history of hypertension, DM 2 and hyperlipidemia who presented on 03/12/2018 was facial droop, slurred speech and left leg weakness.  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  This was attributed to uncontrolled high blood pressure.  During the admission, patient developed paroxysmal atrial fibrillation and was placed on 180 mg daily of diltiazem. [place] Vasc score of 5. He was not placed on systemic anticoagulation initially due to the brain bleed. Later CT showed resolution of hemorrhage and that it would be safe to resume anticoagulation.   On follow up today he still notes stiffness on his left side. He is active. Denies any palpitations, dizziness, chest pain or dyspnea. No bleeding or new CVA symptoms.    Past Medical History:  Diagnosis Date   BPH (benign prostatic hyperplasia)    (-) Bx 2015   Diabetes mellitus without complication (HCC)    Elevated PSA    Prostate Bx in 06/2013 was benign   Glaucoma suspect    HTN (hypertension)    Hyperlipidemia    Macular degeneration, age related     Past Surgical History:  Procedure Laterality Date   ABCESS DRAINAGE     abdomen- 26 day hospitalization 1968   COLONOSCOPY  2016   HERNIA REPAIR  summer '11   umbilical, dr Ninfa Linden    POLYPECTOMY     PROSTATE BIOPSY  06-2013 , 07-2017   (-), (-)    Current Medications: Outpatient Medications Prior to Visit  Medication Sig Dispense Refill   amLODipine (NORVASC) 5 MG tablet TAKE 1 TABLET (5 MG TOTAL) BY MOUTH DAILY. 90 tablet 1   atorvastatin (LIPITOR) 80 MG tablet Take 1 tablet (80 mg total) by mouth daily. 90  tablet 3   diltiazem (CARDIZEM CD) 240 MG 24 hr capsule TAKE 1 CAPSULE BY MOUTH EVERY DAY 90 capsule 1   ELIQUIS 5 MG TABS tablet TAKE 1 TABLET BY MOUTH TWICE A DAY 180 tablet 1   finasteride (PROSCAR) 5 MG tablet Take 5 mg by mouth daily.     meclizine (ANTIVERT) 25 MG tablet Take 1 tablet (25 mg total) by mouth 2 (two) times daily as needed for dizziness. 30 tablet 1   metFORMIN (GLUCOPHAGE) 850 MG tablet TAKE 1 TABLET (850 MG TOTAL) BY MOUTH 2 (TWO) TIMES DAILY WITH A MEAL. 180 tablet 1   metoprolol tartrate (LOPRESSOR) 50 MG tablet TAKE 1 TABLET BY MOUTH TWICE A DAY 180 tablet 3   Multiple Vitamin (MULTIVITAMIN WITH MINERALS) TABS tablet Take 1 tablet by mouth daily.      Multiple Vitamins-Minerals (PRESERVISION/LUTEIN PO) Take 1 tablet by mouth daily.      olopatadine (PATANOL) 0.1 % ophthalmic solution SMARTSIG:In Eye(s)     ONETOUCH VERIO test strip CHECK BLOOD SUGAR NO MORE THAN TWICE DAILY 200 strip 12   RESTASIS 0.05 % ophthalmic emulsion 1 drop 2 (two) times daily.     No facility-administered medications prior to visit.     Allergies:   Patient has no known allergies.   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 beer   Drug use: No   Sexual activity: Yes    Partners: Female  Other Topics Concern   Not on file  Social History Narrative   HS[employer]. [employer]    Married - '69. 2 dtrs , 1 son - homicide. 5 grandchildren.     Household: pt, wife, daughter [name]) and son on-off    Daughter  Laverne limited as off  11/2020    Social Determinants of Health    Financial Resource Strain: Not on file  Food Insecurity: Not on file  Transportation Needs: Not on file  Physical Activity: Not on file  Stress: Not on file  Social Connections: Not on file     Family History:  The patient's family history includes Cancer - Other in his sister; Diabetes in his maternal grandmother; Heart attack in his father; Heart disease in his brother and sister; Kidney disease in his brother; Leukemia in his mother.   ROS:   Please see the history of present illness.    ROS All other systems reviewed and are negative.   PHYSICAL EXAM:   VS:  BP 128/79    Pulse 67    Ht 5\' 11"  (1.803 m)    Wt 144 lb 12.8 oz (65.7 kg)    SpO2 98%    BMI 20.20 kg/m    GENERAL:  Well appearing BM in NAD HEENT:  PERRL, EOMI, sclera are clear. Oropharynx is clear. NECK:  No jugular venous distention, carotid upstroke brisk and symmetric, no bruits, no thyromegaly or adenopathy LUNGS:  Clear to auscultation bilaterally CHEST:  Unremarkable HEART:  RRR,  PMI not displaced or sustained,S1 and S2 within normal limits, no S3, no S4: no clicks, no rubs, no murmurs ABD:  Soft, nontender. BS +, no masses or bruits. No hepatomegaly, no splenomegaly EXT:  2 + pulses throughout, no edema, no cyanosis no clubbing SKIN:  Warm and dry.  No rashes NEURO:  Alert and oriented x 3. Cranial nerves II through XII intact. Some left sided weakness persists. PSYCH:  Cognitively intact    Wt Readings from Last 3 Encounters:  07/17/21 144 lb 12.8 oz (65.7 kg)  03/06/21 144 lb 6 oz (65.5 kg)  12/17/20 142 lb 6.4 oz (64.6 kg)      Studies/Labs Reviewed:   EKG:  EKG is ordered today.  NSR rate 67. Normal. I have personally reviewed and interpreted this study.   Recent Labs: 09/11/2020: Hemoglobin 13.1; Platelets 279 12/17/2020: TSH 1.17 03/06/2021: ALT 15; BUN 15; Creatinine, Ser 0.81; Potassium 4.4; Sodium 141   Lipid Panel    Component Value Date/Time   CHOL 106 03/06/2021 1112   CHOL 141  01/20/2019 0922   TRIG 49.0 03/06/2021 1112   HDL 48.60 03/06/2021 1112   HDL 44 01/20/2019 0922   CHOLHDL 2 03/06/2021 1112   VLDL 9.8 03/06/2021 1112   LDLCALC 48 03/06/2021 1112   LDLCALC 85 01/20/2019 0922    Additional studies/ records that  were reviewed today include:   Echo 03/13/2018 LV EF: 65% -   70% Study Conclusions   - Left ventricle: The cavity size was normal. Wall thickness was   increased in a pattern of mild LVH. Systolic function was   vigorous. The estimated ejection fraction was in the range of 65%   to 70%. Wall motion was normal; there were no regional wall   motion abnormalities. - Aortic valve: There was trivial regurgitation.    ASSESSMENT:    1. PAF (paroxysmal atrial fibrillation) ([place])   2. Essential hypertension   3. Hyperlipidemia, unspecified hyperlipidemia type   4. H/O: CVA (cerebrovascular accident)      PLAN:  In order of problems listed above:  1. PAF: continue Eliquis 5 mg bid long term. Continue BP control. Asymptomatic.   2. History of CVA: s/p hemorrhagic stroke involving right thalamus. Continue Eliquis  3. Hypertension: Blood pressure is well controlled.   4. Hyperlipidemia: last LDL 48. Goal < 70. Continue lipitor 40 mg.   5. DM2: Managed by primary care provider.  On metformin. A1c 5.8%.   I will see in one year   Medication Adjustments/Labs and Tests Ordered: Current medicines are reviewed at length with the patient today.  Concerns regarding medicines are outlined above.  Medication changes, Labs and Tests ordered today are listed in the Patient Instructions below. There are no Patient Instructions on file for this visit.   Signed, Johnedward Brodrick Martinique, MD  07/17/2021 8:26 AM    [facility] [place], [place], [place]  [address] Phone: [phone]; Fax: [phone]

## 2021-07-17 ENCOUNTER — Ambulatory Visit: Payer: Medicare Other | Admitting: Cardiology

## 2021-07-17 ENCOUNTER — Other Ambulatory Visit: Payer: Self-pay

## 2021-07-17 ENCOUNTER — Encounter: Payer: Self-pay | Admitting: Cardiology

## 2021-07-17 VITALS — BP 128/79 | HR 67 | Ht 71.0 in | Wt 144.8 lb

## 2021-07-17 DIAGNOSIS — I10 Essential (primary) hypertension: Secondary | ICD-10-CM

## 2021-07-17 DIAGNOSIS — I480 Paroxysmal atrial fibrillation: Secondary | ICD-10-CM

## 2021-07-17 DIAGNOSIS — Z8673 Personal history of transient ischemic attack (TIA), and cerebral infarction without residual deficits: Secondary | ICD-10-CM | POA: Diagnosis not present

## 2021-07-17 DIAGNOSIS — E785 Hyperlipidemia, unspecified: Secondary | ICD-10-CM | POA: Diagnosis not present

## 2021-07-17 MED ORDER — APIXABAN 5 MG PO TABS: 5.0000 mg | ORAL_TABLET | Freq: Two times a day (BID) | ORAL | 3 refills | Status: DC

## 2021-07-17 NOTE — Addendum Note (Signed)
Addended by: Kathyrn Lass on: 07/17/2021 08:29 AM   Modules accepted: Orders

## 2021-07-30 ENCOUNTER — Ambulatory Visit (INDEPENDENT_AMBULATORY_CARE_PROVIDER_SITE_OTHER): Payer: Medicare Other | Admitting: Internal Medicine

## 2021-07-30 ENCOUNTER — Encounter: Payer: Self-pay | Admitting: Internal Medicine

## 2021-07-30 ENCOUNTER — Other Ambulatory Visit: Payer: Self-pay | Admitting: Cardiology

## 2021-07-30 VITALS — BP 142/82 | HR 64 | Temp 98.1°F | Resp 16 | Ht 71.0 in | Wt 145.2 lb

## 2021-07-30 DIAGNOSIS — E785 Hyperlipidemia, unspecified: Secondary | ICD-10-CM

## 2021-07-30 DIAGNOSIS — E119 Type 2 diabetes mellitus without complications: Secondary | ICD-10-CM

## 2021-07-30 DIAGNOSIS — Z0000 Encounter for general adult medical examination without abnormal findings: Secondary | ICD-10-CM | POA: Diagnosis not present

## 2021-07-30 DIAGNOSIS — I10 Essential (primary) hypertension: Secondary | ICD-10-CM | POA: Diagnosis not present

## 2021-07-30 LAB — BASIC METABOLIC PANEL
BUN: 17 mg/dL (ref 6–23)
CO2: 29 mEq/L (ref 19–32)
Calcium: 9.6 mg/dL (ref 8.4–10.5)
Chloride: 102 mEq/L (ref 96–112)
Creatinine, Ser: 0.88 mg/dL (ref 0.40–1.50)
GFR: 83.27 mL/min (ref >60.00)
Glucose, Bld: 88 mg/dL (ref 70–99)
Potassium: 3.9 mEq/L (ref 3.5–5.1)
Sodium: 138 mEq/L (ref 135–145)

## 2021-07-30 LAB — CBC WITH DIFFERENTIAL/PLATELET
Basophils Absolute: 0 10*3/uL (ref 0.0–0.1)
Basophils Relative: 0.4 % (ref 0.0–3.0)
Eosinophils Absolute: 0.1 10*3/uL (ref 0.0–0.7)
Eosinophils Relative: 1.1 % (ref 0.0–5.0)
HCT: 38.8 % — ABNORMAL LOW (ref 39.0–52.0)
Hemoglobin: 12.6 g/dL — ABNORMAL LOW (ref 13.0–17.0)
Lymphocytes Relative: 31.3 % (ref 12.0–46.0)
Lymphs Abs: 1.5 10*3/uL (ref 0.7–4.0)
MCHC: 32.5 g/dL (ref 30.0–36.0)
MCV: 88.1 fl (ref 78.0–100.0)
Monocytes Absolute: 0.5 10*3/uL (ref 0.1–1.0)
Monocytes Relative: 9.6 % (ref 3.0–12.0)
Neutro Abs: 2.8 10*3/uL (ref 1.4–7.7)
Neutrophils Relative %: 57.6 % (ref 43.0–77.0)
Platelets: 244 10*3/uL (ref 150.0–400.0)
RBC: 4.4 Mil/uL (ref 4.22–5.81)
RDW: 13.8 % (ref 11.5–15.5)
WBC: 4.8 10*3/uL (ref 4.0–10.5)

## 2021-07-30 LAB — HEMOGLOBIN A1C: Hgb A1c MFr Bld: 6 % (ref 4.6–6.5)

## 2021-07-30 NOTE — Assessment & Plan Note (Signed)
Here for CPX DM: On metformin, feet exam showed neuropathy, feet care discussed.  Check A1c Diabetic neuropathy: See above HTN: Ambulatory BPs in the 130s.  Continue amlodipine, Cardizem, metoprolol. Atrial fibrillation, paroxysmal: Saw cardiology 07/17/2021, felt to be stable, continue Eliquis Hyperlipidemia: On Lipitor 80 mg, last LDL satisfactory.  Recheck on RTC. Stroke sequela: At baseline, uses a cane Diarrhea: Has episodic diarrhea, not every day, typically the days he has milk and cereal.  Lactose intolerance? Rec to avoid dairy products for 1 month and see the response. RTC 5 to 6 months

## 2021-07-30 NOTE — Patient Instructions (Addendum)
Check the  blood pressure regularly BP GOAL is between 110/65 and  135/85. If it is consistently higher or lower, let me know  Proceed with another shingles vaccine.  Please read information about advance care planning.  I have also printed information about feet care   GO TO THE LAB : Get the blood work     [place], [place] back for a checkup in 6 months  Diabetes Mellitus and [place] care is an important part of your health, especially when you have diabetes. Diabetes may cause you to have problems because of poor blood flow (circulation) to your feet and legs, which can cause your skin to: Become thinner and drier. Break more easily. Heal more slowly. Peel and crack. You may also have nerve damage (neuropathy) in your legs and feet, causing decreased feeling in them. This means that you may not notice minor injuries to your feet that could lead to more serious problems. Noticing and addressing any potential problems early is the best way to prevent future foot problems. How to care for your feet Foot hygiene  Wash your feet daily with warm water and mild soap. Do not use hot water. Then, pat your feet and the areas between your toes until they are completely dry. Do not soak your feet as this can dry your skin. Trim your toenails straight across. Do not dig under them or around the cuticle. File the edges of your nails with an emery board or nail file. Apply a moisturizing lotion or petroleum jelly to the skin on your feet and to dry, brittle toenails. Use lotion that does not contain alcohol and is unscented. Do not apply lotion between your toes. Shoes and socks Wear clean socks or stockings every day. Make sure they are not too tight. Do not wear knee-high stockings since they may decrease blood flow to your legs. Wear shoes that fit properly and have enough cushioning. Always look in your shoes before you put them on to be sure  there are no objects inside. To break in new shoes, wear them for just a few hours a day. This prevents injuries on your feet. Wounds, scrapes, corns, and calluses  Check your feet daily for blisters, cuts, bruises, sores, and redness. If you cannot see the bottom of your feet, use a mirror or ask someone for help. Do not cut corns or calluses or try to remove them with medicine. If you find a minor scrape, cut, or break in the skin on your feet, keep it and the skin around it clean and dry. You may clean these areas with mild soap and water. Do not clean the area with peroxide, alcohol, or iodine. If you have a wound, scrape, corn, or callus on your foot, look at it several times a day to make sure it is healing and not infected. Check for: Redness, swelling, or pain. Fluid or blood. Warmth. Pus or a bad smell. General tips Do not cross your legs. This may decrease blood flow to your feet. Do not use heating pads or hot water bottles on your feet. They may burn your skin. If you have lost feeling in your feet or legs, you may not know this is happening until it is too late. Protect your feet from hot and cold by wearing shoes, such as at the beach or on hot pavement. Schedule a complete foot exam at least once a year (annually) or more  often if you have foot problems. Report any cuts, sores, or bruises to your health care provider immediately. Where to find more information American Diabetes Association: www.diabetes.org Association of Diabetes Care & Education Specialists: www.diabeteseducator.org Contact a health care provider if: You have a medical condition that increases your risk of infection and you have any cuts, sores, or bruises on your feet. You have an injury that is not healing. You have redness on your legs or feet. You feel burning or tingling in your legs or feet. You have pain or cramps in your legs and feet. Your legs or feet are numb. Your feet always feel cold. You  have pain around any toenails. Get help right away if: You have a wound, scrape, corn, or callus on your foot and: You have pain, swelling, or redness that gets worse. You have fluid or blood coming from the wound, scrape, corn, or callus. Your wound, scrape, corn, or callus feels warm to the touch. You have pus or a bad smell coming from the wound, scrape, corn, or callus. You have a fever. You have a red line going up your leg. Summary Check your feet every day for blisters, cuts, bruises, sores, and redness. Apply a moisturizing lotion or petroleum jelly to the skin on your feet and to dry, brittle toenails. Wear shoes that fit properly and have enough cushioning. If you have foot problems, report any cuts, sores, or bruises to your health care provider immediately. Schedule a complete foot exam at least once a year (annually) or more often if you have foot problems. This information is not intended to replace advice given to you by your health care provider. Make sure you discuss any questions you have with your health care provider. Document Revised: 12/29/2019 Document Reviewed: 12/29/2019 Elsevier Patient Education  [place].

## 2021-07-30 NOTE — Assessment & Plan Note (Signed)
-  Td:  08-2017 - PNM 23:  2011, 2022 - Prevnar 2014 - zostavax 05-2015 -had shingrex #1 at work.  Recommend booster at the pharmacy -COVID vax UTD -Had a flu shot - CCS: Colonoscopy 06-2014, had polyps, cscope 10/2017, next 5 years  per GI letter  - Prostate cancer screening per urology.  -Diet: Counseled. Labs: BMP, CBC, A1c ACP: Info provided

## 2021-07-30 NOTE — Progress Notes (Signed)
Subjective:    Patient ID: Marcus Beasley, male    DOB: 08/16/1945, 76 y.o.   MRN: [id number]  DOS:  07/30/2021 Type of visit - description: cpx  Here for CPX. In general feels well. He still has occasional watery diarrhea. No blood in the stools.  Wt Readings from Last 3 Encounters:  07/30/21 145 lb 4 oz (65.9 kg)  07/17/21 144 lb 12.8 oz (65.7 kg)  03/06/21 144 lb 6 oz (65.5 kg)   BP Readings from Last 3 Encounters:  07/30/21 (!) 142/82  07/17/21 128/79  03/06/21 136/84     Review of Systems  Other than above, a 14 point review of systems is negative     Past Medical History:  Diagnosis Date   BPH (benign prostatic hyperplasia)    (-) Bx 2015   Diabetes mellitus without complication (HCC)    Elevated PSA    Prostate Bx in 06/2013 was benign   Glaucoma suspect    HTN (hypertension)    Hyperlipidemia    Macular degeneration, age related     Past Surgical History:  Procedure Laterality Date   ABCESS DRAINAGE     abdomen- 26 day hospitalization 1968   COLONOSCOPY  2016   HERNIA REPAIR  summer '11   umbilical, dr Ninfa Linden    POLYPECTOMY     PROSTATE BIOPSY  06-2013 , 07-2017   (-), (-)   Social History   Socioeconomic History   Marital status: Married    Spouse name: Not on file   Number of children: 3   Years of education: 14   Highest education level: Not on file  Occupational History   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 beer   Drug use: No   Sexual activity: Yes    Partners: Female  Other Topics Concern   Not on file  Social History Narrative   HS[employer]. [employer]    Married - '69. 2 dtrs , 1 son - homicide. 5 grandchildren.     Household: pt, wife, daughter [name]) and g-son     Driving limited as off  07-2021    Social Determinants of  Health   Financial Resource Strain: Not on file  Food Insecurity: Not on file  Transportation Needs: Not on file  Physical Activity: Not on file  Stress: Not on file  Social Connections: Not on file  Intimate Partner Violence: Not on file    Current Outpatient Medications  Medication Instructions   amLODipine (NORVASC) 5 mg, Oral, Daily   apixaban (ELIQUIS) 5 mg, Oral, 2 times daily   atorvastatin (LIPITOR) 80 mg, Oral, Daily   diltiazem (CARDIZEM CD) 240 MG 24 hr capsule TAKE 1 CAPSULE BY MOUTH EVERY DAY   finasteride (PROSCAR) 5 mg, Oral, Daily   meclizine (ANTIVERT) 25 mg, Oral, 2 times daily PRN   metFORMIN (GLUCOPHAGE) 850 mg, Oral, 2 times daily with meals   metoprolol tartrate (LOPRESSOR) 50 MG tablet TAKE 1 TABLET BY MOUTH TWICE A DAY   Multiple Vitamin (MULTIVITAMIN WITH MINERALS) TABS tablet 1 tablet, Oral, Daily   Multiple Vitamins-Minerals (PRESERVISION/LUTEIN PO) 1 tablet, Oral, Daily   olopatadine (PATANOL) 0.1 % ophthalmic solution SMARTSIG:In Eye(s)   ONETOUCH VERIO test strip CHECK BLOOD SUGAR NO MORE THAN  TWICE DAILY   RESTASIS 0.05 % ophthalmic emulsion 1 drop, 2 times daily       Objective:   Physical Exam BP (!) 142/82 (BP Location: Left Arm, Patient Position: Sitting, Cuff Size: Small)    Pulse 64    Temp 98.1 F (36.7 C) (Oral)    Resp 16    Ht 5\' 11"  (1.803 m)    Wt 145 lb 4 oz (65.9 kg)    SpO2 98%    BMI 20.26 kg/m  General: Well developed, NAD, BMI noted Neck: No  thyromegaly  HEENT:  Normocephalic . Face symmetric, atraumatic Lungs:  CTA B Normal respiratory effort, no intercostal retractions, no accessory muscle use. Heart: RRR,  no murmur.  Abdomen:  Not distended, soft, non-tender. No rebound or rigidity.   DM foot exam: No edema, good pedal pulses, pinprick examination: Sensitivity decreased distally more noticeable on the left. Skin: Exposed areas without rash. Not pale. Not jaundice Neurologic:  alert & oriented X3.  Speech normal,  gait appropriate for age and unassisted Strength symmetric and appropriate for age.  Psych: Cognition and judgment appear intact.  Cooperative with normal attention span and concentration.  Behavior appropriate. No anxious or depressed appearing.     Assessment     Assessment DM  ---->  DX 07-2015, A1c 8.0; started metformin Neuropathy, mild.  DX 08-2017 (in sensitive type) HTN   Hyperlipidemia UROLOGY: BPH, increased PSA. 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  Hep C serologies negative, hep B surface Ab+, hep B core antibody negative (consistent with vaccination)  PLAN Here for CPX DM: On metformin, feet exam showed neuropathy, feet care discussed.  Check A1c Diabetic neuropathy: See above HTN: Ambulatory BPs in the 130s.  Continue amlodipine, Cardizem, metoprolol. Atrial fibrillation, paroxysmal: Saw cardiology 07/17/2021, felt to be stable, continue Eliquis Hyperlipidemia: On Lipitor 80 mg, last LDL satisfactory.  Recheck on RTC. Stroke sequela: At baseline, uses a cane Diarrhea: Has episodic diarrhea, not every day, typically the days he has milk and cereal.  Lactose intolerance? Rec to avoid dairy products for 1 month and see the response. RTC 5 to 6 months   This visit occurred during the SARS-CoV-2 public health emergency.  Safety protocols were in place, including screening questions prior to the visit, additional usage of staff PPE, and extensive cleaning of exam room while observing appropriate contact time as indicated for disinfecting solutions.

## 2021-08-22 ENCOUNTER — Other Ambulatory Visit: Payer: Self-pay

## 2021-08-22 ENCOUNTER — Ambulatory Visit: Payer: Medicare Other | Admitting: Podiatry

## 2021-08-22 DIAGNOSIS — B351 Tinea unguium: Secondary | ICD-10-CM

## 2021-08-22 DIAGNOSIS — E1142 Type 2 diabetes mellitus with diabetic polyneuropathy: Secondary | ICD-10-CM

## 2021-08-22 DIAGNOSIS — L84 Corns and callosities: Secondary | ICD-10-CM | POA: Diagnosis not present

## 2021-08-22 DIAGNOSIS — M79675 Pain in left toe(s): Secondary | ICD-10-CM | POA: Diagnosis not present

## 2021-08-22 DIAGNOSIS — E119 Type 2 diabetes mellitus without complications: Secondary | ICD-10-CM | POA: Diagnosis not present

## 2021-08-22 DIAGNOSIS — M79674 Pain in right toe(s): Secondary | ICD-10-CM

## 2021-08-22 NOTE — Progress Notes (Signed)
?  Subjective:  ?Patient ID: Marcus Beasley, male    DOB: 26-Mar-1946,  MRN: [id number] ? ?Chief Complaint  ?Patient presents with  ? Nail Problem  ?  Thick painful toenails, 3 month follow up  ? ? ?76 y.o. male returns with the above complaint. History confirmed with patient.  Overall doing well no new issues calluses and nails become thickened and painful again. ?Objective:  ?Physical Exam: ?warm, good capillary refill, no trophic changes or ulcerative lesions, normal DP and PT pulses and normal sensory exam. Onychomycosis is noted to all toenails with brown discoloration, thickening the nail plate and subungual debris ?Left Foot:  L dorsal PIPJ corn fifth toe  ?right Foot: medial hallux pinch tyloma, submetatarsal 5 ? ?Assessment:  ? ?1. Pain due to onychomycosis of toenails of both feet   ?2. Callus of foot   ?3. Type 2 diabetes mellitus without complication, without long-term current use of insulin ([name])   ?4. Diabetic polyneuropathy associated with type 2 diabetes mellitus ([name])   ? ? ? ? ?Plan:  ?Patient was evaluated and treated and all questions answered. ? ?Patient educated on diabetes. Discussed proper diabetic foot care and discussed risks and complications of disease. Educated patient in depth on reasons to return to the office immediately should he/she discover anything concerning or new on the feet. All questions answered. Discussed proper shoes as well.  ? ?Discussed the etiology and treatment options for the condition in detail with the patient. Educated patient on the topical and oral treatment options for mycotic nails. Recommended debridement of the nails today. Sharp and mechanical debridement performed of all painful and mycotic nails today. Nails debrided in length and thickness using a nail nipper and a mechanical burr to level of comfort. Discussed treatment options including appropriate shoe gear. Follow up as needed for painful nails. ? ?All symptomatic hyperkeratoses were safely debrided  with a sterile #15 blade to patient's level of comfort without incident. We discussed preventative and palliative care of these lesions including supportive and accommodative shoegear, padding, prefabricated and custom molded accommodative orthoses, use of a pumice stone and lotions/creams daily.  He has been using moisturizing cream and this is led to some improvement.  Recommend he continue to use this.We discussed the only permanent solution to the calluses would likely be surgical intervention he would like to hold off on this for now. ? ? ?Return in about 3 months (around 11/22/2021) for at risk diabetic foot care.  ? ?

## 2021-09-20 ENCOUNTER — Other Ambulatory Visit: Payer: Self-pay | Admitting: Internal Medicine

## 2021-10-07 LAB — PSA: PSA: 3.36

## 2021-10-09 ENCOUNTER — Telehealth: Payer: Self-pay | Admitting: Cardiology

## 2021-10-09 NOTE — Telephone Encounter (Signed)
Patient calling the office for samples of medication: ? ? ?1.  What medication and dosage are you requesting samples for? Eliquis ? ?2.  Are you currently out of this medication?  Just  a few ? ? ?

## 2021-10-10 NOTE — Telephone Encounter (Signed)
Patient was prescribed a year supply in January.  Can he not afford? ?

## 2021-10-10 NOTE — Telephone Encounter (Signed)
Contacted patient, he states that he was getting them from patient assistance last year, however when he reapplied this year they advised him he had to spend $1,000- he is working on doing that. I advised samples request had to go through pharmd and we would make them aware, and let him know if samples were given.  ? ?Thanks! ?

## 2021-10-10 NOTE — Telephone Encounter (Signed)
Yes, can have samples.  5 mg BID ?

## 2021-10-10 NOTE — Telephone Encounter (Signed)
Contacted patient, advised that samples of Eliquis 5 mg were placed up front for pick up.  ?Patient will come Monday 04/24 to pick up medications.  ? ?Patient thankful for call back, verbalized understanding. ? ?

## 2021-10-16 ENCOUNTER — Encounter: Payer: Self-pay | Admitting: Internal Medicine

## 2021-10-17 ENCOUNTER — Other Ambulatory Visit: Payer: Self-pay | Admitting: Cardiology

## 2021-11-28 ENCOUNTER — Ambulatory Visit (INDEPENDENT_AMBULATORY_CARE_PROVIDER_SITE_OTHER): Payer: Medicare Other | Admitting: Podiatry

## 2021-11-28 DIAGNOSIS — E119 Type 2 diabetes mellitus without complications: Secondary | ICD-10-CM | POA: Diagnosis not present

## 2021-11-28 DIAGNOSIS — L84 Corns and callosities: Secondary | ICD-10-CM | POA: Diagnosis not present

## 2021-11-28 DIAGNOSIS — M79675 Pain in left toe(s): Secondary | ICD-10-CM

## 2021-11-28 DIAGNOSIS — M79674 Pain in right toe(s): Secondary | ICD-10-CM | POA: Diagnosis not present

## 2021-11-28 DIAGNOSIS — B351 Tinea unguium: Secondary | ICD-10-CM | POA: Diagnosis not present

## 2021-12-02 NOTE — Progress Notes (Signed)
  Subjective:  Patient ID: Marcus Beasley, male    DOB: 20-Apr-1946,  MRN: [id number]  Chief Complaint  Patient presents with   Nail Problem    Thick painful toenails, 3 month follow up     76 y.o. male returns with the above complaint. History confirmed with patient.  Overall doing well no new issues calluses and nails become thickened and painful again. Objective:  Physical Exam: warm, good capillary refill, no trophic changes or ulcerative lesions, normal DP and PT pulses and normal sensory exam. Onychomycosis is noted to all toenails with brown discoloration, thickening the nail plate and subungual debris Left Foot:  L dorsal PIPJ corn fifth toe  right Foot: medial hallux pinch tyloma, submetatarsal 5  Assessment:   1. Pain due to onychomycosis of toenails of both feet   2. Callus of foot   3. Type 2 diabetes mellitus without complication, without long-term current use of insulin ([place])        Plan:  Patient was evaluated and treated and all questions answered.  Patient educated on diabetes. Discussed proper diabetic foot care and discussed risks and complications of disease. Educated patient in depth on reasons to return to the office immediately should he/she discover anything concerning or new on the feet. All questions answered. Discussed proper shoes as well.   Discussed the etiology and treatment options for the condition in detail with the patient. Educated patient on the topical and oral treatment options for mycotic nails. Recommended debridement of the nails today. Sharp and mechanical debridement performed of all painful and mycotic nails today. Nails debrided in length and thickness using a nail nipper and a mechanical burr to level of comfort. Discussed treatment options including appropriate shoe gear. Follow up as needed for painful nails.  All symptomatic hyperkeratoses were safely debrided with a sterile #15 blade to patient's level of comfort without incident. We  discussed preventative and palliative care of these lesions including supportive and accommodative shoegear, padding, prefabricated and custom molded accommodative orthoses, use of a pumice stone and lotions/creams daily.  He has been using moisturizing cream and this is led to some improvement.  Recommend he continue to use this.We discussed the only permanent solution to the calluses would likely be surgical intervention he would like to hold off on this for now.   Return in about 3 months (around 02/28/2022) for at risk diabetic foot care.

## 2021-12-04 ENCOUNTER — Other Ambulatory Visit: Payer: Self-pay | Admitting: Internal Medicine

## 2021-12-06 ENCOUNTER — Other Ambulatory Visit: Payer: Self-pay | Admitting: Internal Medicine

## 2021-12-26 ENCOUNTER — Encounter: Payer: Self-pay | Admitting: Internal Medicine

## 2022-01-07 NOTE — Progress Notes (Addendum)
Subjective:   Marcus Beasley is a 76 y.o. male who presents for an Initial Medicare Annual Wellness Visit.  Review of Systems     Cardiac Risk Factors include: advanced age (>22mn, >>63women);diabetes mellitus;hypertension;dyslipidemia;male gender     Objective:    Today's Vitals   01/08/22 0819  BP: 132/88  Pulse: 73  Resp: 16  Temp: 98.1 F (36.7 C)  SpO2: 98%  Weight: 146 lb 3.2 oz (66.3 kg)  Height: '5\' 11"'$  (1.803 m)   Body mass index is 20.39 kg/m.     01/08/2022    8:15 AM 09/11/2020    7:44 PM 04/18/2020    8:49 PM 04/11/2019    7:20 AM 11/03/2018    5:49 PM 07/19/2018    8:53 AM 06/04/2018    3:51 PM  Advanced Directives  Does Patient Have a Medical Advance Directive? No No No Unable to assess, patient is non-responsive or altered mental status No No No  Would patient like information on creating a medical advance directive? No - Patient declined No - Patient declined No - Patient declined No - Patient declined No - Patient declined Yes (MAU/Ambulatory/Procedural Areas - Information given) Yes (MAU/Ambulatory/Procedural Areas - Information given)    Current Medications (verified) Outpatient Encounter Medications as of 01/08/2022  Medication Sig   amLODipine (NORVASC) 5 MG tablet TAKE 1 TABLET (5 MG TOTAL) BY MOUTH DAILY.   apixaban (ELIQUIS) 5 MG TABS tablet Take 1 tablet (5 mg total) by mouth 2 (two) times daily.   atorvastatin (LIPITOR) 80 MG tablet TAKE 1 TABLET BY MOUTH EVERY DAY   diltiazem (CARDIZEM CD) 240 MG 24 hr capsule TAKE 1 CAPSULE BY MOUTH EVERY DAY   finasteride (PROSCAR) 5 MG tablet Take 5 mg by mouth daily.   meclizine (ANTIVERT) 25 MG tablet TAKE 1 TABLET (25 MG TOTAL) BY MOUTH 2 (TWO) TIMES DAILY AS NEEDED FOR DIZZINESS.   metFORMIN (GLUCOPHAGE) 850 MG tablet TAKE 1 TABLET (850 MG TOTAL) BY MOUTH 2 (TWO) TIMES DAILY WITH A MEAL.   metoprolol tartrate (LOPRESSOR) 50 MG tablet TAKE 1 TABLET BY MOUTH TWICE A DAY   Multiple Vitamin (MULTIVITAMIN  WITH MINERALS) TABS tablet Take 1 tablet by mouth daily.    Multiple Vitamins-Minerals (PRESERVISION/LUTEIN PO) Take 1 tablet by mouth daily.    olopatadine (PATANOL) 0.1 % ophthalmic solution SMARTSIG:In Eye(s)   ONETOUCH VERIO test strip CHECK BLOOD SUGAR NO MORE THAN TWICE DAILY   RESTASIS 0.05 % ophthalmic emulsion 1 drop 2 (two) times daily.   No facility-administered encounter medications on file as of 01/08/2022.    Allergies (verified) Patient has no known allergies.   History: Past Medical History:  Diagnosis Date   BPH (benign prostatic hyperplasia)    (-) Bx 2015   Diabetes mellitus without complication (HCC)    Elevated PSA    Prostate Bx in 06/2013 was benign   Glaucoma suspect    HTN (hypertension)    Hyperlipidemia    Macular degeneration, age related    Past Surgical History:  Procedure Laterality Date   ABCESS DRAINAGE     abdomen- 26 day hospitalization 1968   COLONOSCOPY  2016   HERNIA REPAIR  summer '11   umbilical, dr BNinfa Linden   POLYPECTOMY     PROSTATE BIOPSY  06-2013 , 07-2017   (-), (-)   Family History  Problem Relation Age of Onset   Leukemia Mother    Heart attack Father  MI age 24   Heart disease Sister        age 11   Cancer - Other Sister        type   Heart disease Brother        age 71   Kidney disease Brother        HD   Diabetes Maternal Grandmother    Prostate cancer Neg Hx    Colon cancer Neg Hx    Esophageal cancer Neg Hx    Rectal cancer Neg Hx    Stomach cancer Neg Hx    Social History   Socioeconomic History   Marital status: Married    Spouse name: Not on file   Number of children: 3   Years of education: 14   Highest education level: Not on file  Occupational History   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 beer   Drug use: No   Sexual activity: Yes    Partners: Female  Other Topics Concern   Not on file  Social History Narrative   HS[employer]. [employer]    Married - '69. 2 dtrs , 1 son - homicide. 5 grandchildren.     Household: pt, wife, daughter [name]) and g-son     Driving limited as off  07-2021    Social Determinants of Health   Financial Resource Strain: Low Risk  (01/08/2022)   Overall Financial Resource Strain (CARDIA)    Difficulty of Paying Living Expenses: Not hard at all  Food Insecurity: No Food Insecurity (01/08/2022)   Hunger Vital Sign    Worried About Running Out of Food in the Last Year: Never true    [place] in the Last Year: Never true  Transportation Needs: No Transportation Needs (01/08/2022)   PRAPARE - Hydrologist (Medical): No    Lack of Transportation (Non-Medical): No  Physical Activity: Sufficiently Active (01/08/2022)   Exercise Vital Sign    Days of Exercise per Week: 6 days    Minutes of Exercise per Session: 30 min  Stress: Not on file  Social Connections: Not on file    Tobacco Counseling Counseling given: Not Answered   Clinical Intake:  Pre-visit preparation completed: Yes        BMI - recorded: 20.39 Nutritional Status: BMI of 19-24  Normal Nutritional Risks: Nausea/ vomitting/ diarrhea Diabetes: Yes CBG done?: No Did pt. bring in CBG monitor from home?: No  How often do you need to have someone help you when you read instructions, pamphlets, or other written materials from your doctor or pharmacy?: 1 - Never  Diabetic?yes Nutrition Risk Assessment:  Has the patient had any N/V/D within the last 2 months?  Yes  Does the patient have any non-healing wounds?  No  Has the patient had any unintentional weight loss or weight gain?  No   Diabetes:  Is the patient diabetic?  Yes  If diabetic, was a CBG obtained today?  No  Did the patient bring in their glucometer from home?  No  How  often do you monitor your CBG's? Once daily.   Financial Strains and Diabetes Management:  Are you having any financial strains with the device, your supplies or your medication? No .  Does the patient want to be seen by Chronic Care Management for management of their diabetes?  No  Would the patient like to be referred to a Nutritionist or for Diabetic Management?  No   Diabetic Exams:  Diabetic Eye Exam: Completed 06/12/21 Diabetic Foot Exam: Completed 07/30/21    Interpreter Needed?: No  Information entered by :: [place] [facility] Daily Living    01/08/2022    8:28 AM  In your present state of health, do you have any difficulty performing the following activities:  Hearing? 0  Vision? 0  Difficulty concentrating or making decisions? 0  Walking or climbing stairs? 0  Dressing or bathing? 0  Doing errands, shopping? 1  Preparing Food and eating ? N  Using the Toilet? N  In the past six months, have you accidently leaked urine? N  Do you have problems with loss of bowel control? N  Managing your Medications? Y  Comment daughter helps  Managing your Finances? N  Housekeeping or managing your Housekeeping? N    Patient Care Team: Colon Branch, MD as PCP - General (Internal Medicine) [place], Peter M, MD as PCP - Cardiology (Cardiology) Pyrtle, Lajuan Lines, MD as Consulting Physician (Gastroenterology) Ardis Hughs, MD as Attending Physician (Urology) Druscilla Brownie, MD as Consulting Physician (Dermatology) Shirley Muscat Loreen Freud, MD as Referring Physician (Optometry) Warden Fillers, MD as Consulting Physician (Ophthalmology)  Indicate any recent Medical Services you may have received from other than [facility] providers in the past year (date may be approximate).     Assessment:   This is a routine wellness examination for Demarcus.  Hearing/Vision screen No results found.  Dietary issues and exercise activities discussed: Current Exercise Habits: Home  exercise routine, Type of exercise: walking, Time (Minutes): 30, Frequency (Times/Week): 6, Weekly Exercise (Minutes/Week): 180, Intensity: Mild, Exercise limited by: None identified   Goals Addressed   None    Depression Screen    01/08/2022    8:16 AM 07/30/2021    9:49 AM 12/17/2020    8:39 AM 09/10/2020    9:28 AM 03/27/2020    9:11 AM 02/09/2020    1:31 PM 12/19/2019   10:02 AM  PHQ 2/9 Scores  PHQ - 2 Score 0 0 0 0 0 0 0    Fall Risk    01/08/2022    8:16 AM 07/30/2021    9:48 AM 12/17/2020    8:39 AM 11/15/2020    9:54 AM 09/10/2020    9:27 AM  Fall Risk   Falls in the past year? 0 0 0 1 0  Number falls in past yr: 0 0 0 0 0  Injury with Fall? 0 0 0 0 0  Risk for fall due to : No Fall Risks      Follow up Falls evaluation completed Falls evaluation completed       FALL RISK PREVENTION PERTAINING TO THE HOME:  Any stairs in or around the home? Yes  If so, are there any without handrails? No  Home free of loose throw rugs in walkways, pet beds, electrical cords, etc? Yes  Adequate lighting in your home to reduce risk of falls? Yes   ASSISTIVE DEVICES UTILIZED TO PREVENT FALLS:  Life alert? No  Use of a cane, walker or w/c? Yes  Grab bars in the bathroom? Yes  Shower chair or bench in shower? No  Elevated toilet seat or a handicapped toilet? Yes   TIMED UP AND GO:  Was  the test performed? Yes .  Length of time to ambulate 10 feet: 10 sec.   Gait steady and fast with assistive device  Cognitive Function:        01/08/2022    8:34 AM  6CIT Screen  What Year? 0 points  What month? 0 points  What time? 0 points  Count back from 20 0 points  Months in reverse 0 points  Repeat phrase 0 points  Total Score 0 points    Immunizations Immunization History  Administered Date(s) Administered   Fluad Quad(high Dose 65+) 03/29/2019, 03/06/2021   Influenza Whole 03/30/2012   Influenza, High Dose Seasonal PF 03/13/2018, 04/15/2018, 03/08/2020    Influenza-Unspecified 03/30/2013, 05/05/2014, 03/24/2015   PFIZER(Purple Top)SARS-COV-2 Vaccination 08/06/2019, 08/31/2019, 07/13/2020   Pneumococcal Conjugate-13 05/10/2013   Pneumococcal Polysaccharide-23 01/03/2010, 07/20/2020   Td 08/26/2007, 08/25/2017   Zoster Recombinat (Shingrix) 06/10/2019   Zoster, Live 05/24/2015    TDAP status: Up to date  Flu Vaccine status: Up to date  Pneumococcal vaccine status: Up to date  Covid-19 vaccine status: Information provided on how to obtain vaccines.   Qualifies for Shingles Vaccine? Yes   Zostavax completed No   Shingrix Completed?: No.    Education has been provided regarding the importance of this vaccine. Patient has been advised to call insurance company to determine out of pocket expense if they have not yet received this vaccine. Advised may also receive vaccine at local pharmacy or Health Dept. Verbalized acceptance and understanding.  Screening Tests Health Maintenance  Topic Date Due   Zoster Vaccines- Shingrix (2 of 2) 08/05/2019   COVID-19 Vaccine (4 - Booster for Pfizer series) 09/07/2020   URINE MICROALBUMIN  03/06/2022   INFLUENZA VACCINE  01/21/2022   HEMOGLOBIN A1C  01/27/2022   OPHTHALMOLOGY EXAM  06/12/2022   FOOT EXAM  07/30/2022   COLONOSCOPY (Pts 45-48yr Insurance coverage will need to be confirmed)  11/12/2022   TETANUS/TDAP  08/26/2027   Pneumonia Vaccine 77 Years old  Completed   Hepatitis C Screening  Completed   HPV VACCINES  Aged Out    Health Maintenance  Health Maintenance Due  Topic Date Due   Zoster Vaccines- Shingrix (2 of 2) 08/05/2019   COVID-19 Vaccine (4 - Booster for Pfizer series) 09/07/2020   URINE MICROALBUMIN  03/06/2022    Colorectal cancer screening: Type of screening: Colonoscopy. Completed 11/11/17. Repeat every 5 years  Lung Cancer Screening: (Low Dose CT Chest recommended if Age 77-80years, 30 pack-year currently smoking OR have quit w/in 15years.) does not qualify.   Lung  Cancer Screening Referral: n/a  Additional Screening:  Hepatitis C Screening: does qualify; Completed 08/21/16  Vision Screening: Recommended annual ophthalmology exams for early detection of glaucoma and other disorders of the eye. Is the patient up to date with their annual eye exam?  Yes  Who is the provider or what is the name of the office in which the patient attends annual eye exams? Dr. GKaty FitchIf pt is not established with a provider, would they like to be referred to a provider to establish care? No .   Dental Screening: Recommended annual dental exams for proper oral hygiene  Community Resource Referral / Chronic Care Management: CRR required this visit?  No   CCM required this visit?  No      Plan:     I have personally reviewed and noted the following in the patient's chart:   Medical and social history Use of alcohol, tobacco or illicit  drugs  Current medications and supplements including opioid prescriptions. Patient is not currently taking opioid prescriptions. Functional ability and status Nutritional status Physical activity Advanced directives List of other physicians Hospitalizations, surgeries, and ER visits in previous 12 months Vitals Screenings to include cognitive, depression, and falls Referrals and appointments  In addition, I have reviewed and discussed with patient certain preventive protocols, quality metrics, and best practice recommendations. A written personalized care plan for preventive services as well as general preventive health recommendations were provided to patient.     Duard Brady Edell Mesenbrink, [place]   01/08/2022   Nurse Notes: none  I have reviewed and agree with [facility] documentation.  Kathlene November, MD

## 2022-01-08 ENCOUNTER — Ambulatory Visit (INDEPENDENT_AMBULATORY_CARE_PROVIDER_SITE_OTHER): Payer: Medicare Other

## 2022-01-08 VITALS — BP 132/88 | HR 73 | Temp 98.1°F | Resp 16 | Ht 71.0 in | Wt 146.2 lb

## 2022-01-08 DIAGNOSIS — Z0000 Encounter for general adult medical examination without abnormal findings: Secondary | ICD-10-CM | POA: Diagnosis not present

## 2022-01-08 NOTE — Patient Instructions (Signed)
[name] , Thank you for taking time to come for your Medicare Wellness Visit. I appreciate your ongoing commitment to your health goals. Please review the following plan we discussed and let me know if I can assist you in the future.   Screening recommendations/referrals: Colonoscopy: 11/11/17 due 10/2222 Recommended yearly ophthalmology/optometry visit for glaucoma screening and checkup Recommended yearly dental visit for hygiene and checkup  Vaccinations: Influenza vaccine: up to date Pneumococcal vaccine: up to date Tdap vaccine: up to date Shingles vaccine: Due-May obtain vaccine at your local pharmacy.    Covid-19: Due-May obtain vaccine at your local pharmacy.   Advanced directives: no  Conditions/risks identified: see problem list   Next appointment: Follow up in one year for your annual wellness visit. 01/13/23  Preventive Care 77 Years and Older, Male Preventive care refers to lifestyle choices and visits with your health care provider that can promote health and wellness. What does preventive care include? A yearly physical exam. This is also called an annual well check. Dental exams once or twice a year. Routine eye exams. Ask your health care provider how often you should have your eyes checked. Personal lifestyle choices, including: Daily care of your teeth and gums. Regular physical activity. Eating a healthy diet. Avoiding tobacco and drug use. Limiting alcohol use. Practicing safe sex. Taking low doses of aspirin every day. Taking vitamin and mineral supplements as recommended by your health care provider. What happens during an annual well check? The services and screenings done by your health care provider during your annual well check will depend on your age, overall health, lifestyle risk factors, and family history of disease. Counseling  Your health care provider may ask you questions about your: Alcohol use. Tobacco use. Drug use. Emotional  well-being. Home and relationship well-being. Sexual activity. Eating habits. History of falls. Memory and ability to understand (cognition). Work and work Statistician. Screening  You may have the following tests or measurements: Height, weight, and BMI. Blood pressure. Lipid and cholesterol levels. These may be checked every 5 years, or more frequently if you are over 35 years old. Skin check. Lung cancer screening. You may have this screening every year starting at age 77 if you have a 30-pack-year history of smoking and currently smoke or have quit within the past 15 years. Fecal occult blood test (FOBT) of the stool. You may have this test every year starting at age 77. Flexible sigmoidoscopy or colonoscopy. You may have a sigmoidoscopy every 5 years or a colonoscopy every 10 years starting at age 77. Prostate cancer screening. Recommendations will vary depending on your family history and other risks. Hepatitis C blood test. Hepatitis B blood test. Sexually transmitted disease (STD) testing. Diabetes screening. This is done by checking your blood sugar (glucose) after you have not eaten for a while (fasting). You may have this done every 1-3 years. Abdominal aortic aneurysm (AAA) screening. You may need this if you are a current or former smoker. Osteoporosis. You may be screened starting at age 77 if you are at high risk. Talk with your health care provider about your test results, treatment options, and if necessary, the need for more tests. Vaccines  Your health care provider may recommend certain vaccines, such as: Influenza vaccine. This is recommended every year. Tetanus, diphtheria, and acellular pertussis (Tdap, Td) vaccine. You may need a Td booster every 10 years. Zoster vaccine. You may need this after age 60. Pneumococcal 13-valent conjugate (PCV13) vaccine. One dose is recommended after  age 77. Pneumococcal polysaccharide (PPSV23) vaccine. One dose is recommended after  age 77. Talk to your health care provider about which screenings and vaccines you need and how often you need them. This information is not intended to replace advice given to you by your health care provider. Make sure you discuss any questions you have with your health care provider. Document Released: 07/06/2015 Document Revised: 02/27/2016 Document Reviewed: 04/10/2015 Elsevier Interactive Patient Education  2017 [place] Prevention in the Home Falls can cause injuries. They can happen to people of all ages. There are many things you can do to make your home safe and to help prevent falls. What can I do on the outside of my home? Regularly fix the edges of walkways and driveways and fix any cracks. Remove anything that might make you trip as you walk through a door, such as a raised step or threshold. Trim any bushes or trees on the path to your home. Use bright outdoor lighting. Clear any walking paths of anything that might make someone trip, such as rocks or tools. Regularly check to see if handrails are loose or broken. Make sure that both sides of any steps have handrails. Any raised decks and porches should have guardrails on the edges. Have any leaves, snow, or ice cleared regularly. Use sand or salt on walking paths during winter. Clean up any spills in your garage right away. This includes oil or grease spills. What can I do in the bathroom? Use night lights. Install grab bars by the toilet and in the tub and shower. Do not use towel bars as grab bars. Use non-skid mats or decals in the tub or shower. If you need to sit down in the shower, use a plastic, non-slip stool. Keep the floor dry. Clean up any water that spills on the floor as soon as it happens. Remove soap buildup in the tub or shower regularly. Attach bath mats securely with double-sided non-slip rug tape. Do not have throw rugs and other things on the floor that can make you trip. What can I do in the  bedroom? Use night lights. Make sure that you have a light by your bed that is easy to reach. Do not use any sheets or blankets that are too big for your bed. They should not hang down onto the floor. Have a firm chair that has side arms. You can use this for support while you get dressed. Do not have throw rugs and other things on the floor that can make you trip. What can I do in the kitchen? Clean up any spills right away. Avoid walking on wet floors. Keep items that you use a lot in easy-to-reach places. If you need to reach something above you, use a strong step stool that has a grab bar. Keep electrical cords out of the way. Do not use floor polish or wax that makes floors slippery. If you must use wax, use non-skid floor wax. Do not have throw rugs and other things on the floor that can make you trip. What can I do with my stairs? Do not leave any items on the stairs. Make sure that there are handrails on both sides of the stairs and use them. Fix handrails that are broken or loose. Make sure that handrails are as long as the stairways. Check any carpeting to make sure that it is firmly attached to the stairs. Fix any carpet that is loose or worn. Avoid having throw rugs  at the top or bottom of the stairs. If you do have throw rugs, attach them to the floor with carpet tape. Make sure that you have a light switch at the top of the stairs and the bottom of the stairs. If you do not have them, ask someone to add them for you. What else can I do to help prevent falls? Wear shoes that: Do not have high heels. Have rubber bottoms. Are comfortable and fit you well. Are closed at the toe. Do not wear sandals. If you use a stepladder: Make sure that it is fully opened. Do not climb a closed stepladder. Make sure that both sides of the stepladder are locked into place. Ask someone to hold it for you, if possible. Clearly mark and make sure that you can see: Any grab bars or  handrails. First and last steps. Where the edge of each step is. Use tools that help you move around (mobility aids) if they are needed. These include: Canes. Walkers. Scooters. Crutches. Turn on the lights when you go into a dark area. Replace any light bulbs as soon as they burn out. Set up your furniture so you have a clear path. Avoid moving your furniture around. If any of your floors are uneven, fix them. If there are any pets around you, be aware of where they are. Review your medicines with your doctor. Some medicines can make you feel dizzy. This can increase your chance of falling. Ask your doctor what other things that you can do to help prevent falls. This information is not intended to replace advice given to you by your health care provider. Make sure you discuss any questions you have with your health care provider. Document Released: 04/05/2009 Document Revised: 11/15/2015 Document Reviewed: 07/14/2014 Elsevier Interactive Patient Education  2017 [employer].

## 2022-01-27 ENCOUNTER — Ambulatory Visit (INDEPENDENT_AMBULATORY_CARE_PROVIDER_SITE_OTHER): Payer: Medicare Other | Admitting: Internal Medicine

## 2022-01-27 ENCOUNTER — Encounter: Payer: Self-pay | Admitting: Internal Medicine

## 2022-01-27 VITALS — BP 130/68 | HR 68 | Temp 98.3°F | Resp 16 | Ht 71.0 in | Wt 148.0 lb

## 2022-01-27 DIAGNOSIS — I10 Essential (primary) hypertension: Secondary | ICD-10-CM | POA: Diagnosis not present

## 2022-01-27 DIAGNOSIS — E119 Type 2 diabetes mellitus without complications: Secondary | ICD-10-CM

## 2022-01-27 DIAGNOSIS — I480 Paroxysmal atrial fibrillation: Secondary | ICD-10-CM

## 2022-01-27 DIAGNOSIS — R197 Diarrhea, unspecified: Secondary | ICD-10-CM

## 2022-01-27 LAB — MICROALBUMIN / CREATININE URINE RATIO
Creatinine,U: 122 mg/dL
Microalb Creat Ratio: 0.9 mg/g (ref 0.0–30.0)
Microalb, Ur: 1 mg/dL (ref 0.0–1.9)

## 2022-01-27 LAB — COMPREHENSIVE METABOLIC PANEL
ALT: 18 U/L (ref 0–53)
AST: 18 U/L (ref 0–37)
Albumin: 4.5 g/dL (ref 3.5–5.2)
Alkaline Phosphatase: 91 U/L (ref 39–117)
BUN: 20 mg/dL (ref 6–23)
CO2: 27 mEq/L (ref 19–32)
Calcium: 9.7 mg/dL (ref 8.4–10.5)
Chloride: 105 mEq/L (ref 96–112)
Creatinine, Ser: 0.97 mg/dL (ref 0.40–1.50)
GFR: 75.33 mL/min (ref >60.00)
Glucose, Bld: 93 mg/dL (ref 70–99)
Potassium: 4.7 mEq/L (ref 3.5–5.1)
Sodium: 145 mEq/L (ref 135–145)
Total Bilirubin: 0.5 mg/dL (ref 0.2–1.2)
Total Protein: 6.9 g/dL (ref 6.0–8.3)

## 2022-01-27 LAB — HEMOGLOBIN A1C: Hgb A1c MFr Bld: 6.1 % (ref 4.6–6.5)

## 2022-01-27 LAB — TSH: TSH: 1.09 u[IU]/mL (ref 0.35–5.50)

## 2022-01-27 MED ORDER — METFORMIN HCL 500 MG PO TABS: 500.0000 mg | ORAL_TABLET | Freq: Every day | ORAL | 1 refills | Status: DC

## 2022-01-27 NOTE — Patient Instructions (Addendum)
For diarrhea: Avoid diary products (milk, cheese, yogurt) for 1 month Avoid cereals for 1 month  Decrease metformin to a 500 mg tablet once a day only  Recommend to proceed with the following vaccines at your pharmacy,  Shingrix (shingles) #2 Covid booster (bivalent)  Flu shot this fall  Check the  blood pressure regularly BP GOAL is between 110/65 and  135/85. If it is consistently higher or lower, let me know    GO TO THE LAB : Get the blood work     [place], [place] back for   a checkup in 2 months     Lactose Intolerance, Adult Lactose is a natural sugar that is found in dairy milk and dairy products such as cheese and yogurt. Lactose is digested by lactase, a protein in your small intestine. Some people do not produce enough lactase to digest lactose. This is called lactose intolerance. Lactose intolerance is different from milk allergy, which is a more serious reaction to the protein in milk. What are the causes? Causes of lactose intolerance may include: Normal aging. The ability to produce lactase may lessen with age, causing lactose intolerance over time. Being born without the ability to make lactase. Digestive diseases such as gastroenteritis or inflammatory bowel disease (IBD). Surgery or injury to your small intestine. Infection in your intestines. Certain antibiotic medicines and cancer treatments. What are the signs or symptoms? Lactose intolerance can cause discomfort within 30 minutes to 2 hours after you eat or drink something that contains lactose. Symptoms may include: Nausea. Diarrhea. Cramps or pain in the abdomen. Bloating. You may have a full, tight, or painful feeling in the abdomen. Gas. How is this diagnosed? This condition may be diagnosed based on: Your symptoms and medical history. Lactose tolerance test. This test involves drinking a lactose solution and then having blood tests to measure the  amount of glucose in your blood. If your blood glucose level does not go up, it means your body is not able to digest the lactose. Lactose breath test (hydrogen breath test). This test involves drinking a lactose solution and then exhaling into a type of bag while you digest the solution. Having a lot of hydrogen in your breath can be a sign of lactose intolerance. How is this treated? There is no treatment to improve your body's ability to produce lactase. However, you can manage your symptoms at home by: Limiting or avoiding dairy milk, dairy products, and other sources of lactose. Taking lactase tablets when you eat or drink milk products. Lactase tablets are over-the-counter medicines that help to improve lactose digestion. You may also add lactase drops to regular milk. Adjusting your diet, such as drinking lactose-free milk. Lactose tolerance varies from person to person. Some people may be able to eat or drink small amounts of products that contain lactose, and other people may need to avoid all foods and drinks that contain lactose. Talk with your health care provider about what treatment is best for you. Follow these instructions at home: Limit or avoid foods, beverages, and medicines that contain lactose, as told by your health care provider. Keep track of which foods, beverages, or medicines cause symptoms so you can decide what to avoid in the future. Read food and medicine labels carefully. Avoid products that contain: Lactose. Milk solids. Casein. Whey. Take over-the-counter and prescription medicines (including lactase tablets) only as told by your health care provider. If you stop eating and drinking dairy  products, make sure to get enough protein, calcium, and vitamin D from other foods. Work with your health care provider or a dietitian to make sure you get enough of those nutrients. Choose a milk substitute that has been fortified with vitamin D or calcium. Fortified means that  vitamin D or calcium has been added to the product. Soy milk contains high-quality protein. Milks that are made from nuts or grains contain very small amounts of protein. These include almond milk and rice milk. Keep all follow-up visits. This is important. Contact a health care provider if: You have no relief from your symptoms after you have eliminated milk products and other sources of lactose. Get help right away if: You have blood in your stool (feces). You have severe abdominal pain. Summary Lactose is a natural sugar that is found in dairy milk and dairy products such as cheese and yogurt. Lactose is digested by lactase, which is a protein in the small intestine. Some people do not produce enough lactase to digest lactose. This is called lactose intolerance. Lactose intolerance can cause discomfort within 30 minutes to 2 hours after you eat or drink something that contains lactose. Limit or avoid foods, beverages, and medicines that contain lactose, as told by your health care provider. This information is not intended to replace advice given to you by your health care provider. Make sure you discuss any questions you have with your health care provider. Document Revised: 05/15/2020 Document Reviewed: 05/15/2020 Elsevier Patient Education  [place].

## 2022-01-27 NOTE — Progress Notes (Unsigned)
Subjective:    Patient ID: Marcus Beasley, male    DOB: [birth year], 76 y.o.   MRN: [id number]  DOS:  01/27/2022 Type of visit - description: Routine checkup  In general feels well. Again reports episodes of diarrhea with loose stools daily. Again she thinks is food related (cereal, milk) next Denies chest pain or difficulty breathing No palpitations No nausea or vomiting.  No diarrhea No gross hematuria Wt Readings from Last 3 Encounters:  01/27/22 148 lb (67.1 kg)  01/08/22 146 lb 3.2 oz (66.3 kg)  07/30/21 145 lb 4 oz (65.9 kg)     Review of Systems See above   Past Medical History:  Diagnosis Date   BPH (benign prostatic hyperplasia)    (-) Bx 2015   Diabetes mellitus without complication (HCC)    Elevated PSA    Prostate Bx in 06/2013 was benign   Glaucoma suspect    HTN (hypertension)    Hyperlipidemia    Macular degeneration, age related     Past Surgical History:  Procedure Laterality Date   ABCESS DRAINAGE     abdomen- 26 day hospitalization 1968   COLONOSCOPY  2016   HERNIA REPAIR  summer '11   umbilical, dr Ninfa Linden    POLYPECTOMY     PROSTATE BIOPSY  06-2013 , 07-2017   (-), (-)    Current Outpatient Medications  Medication Instructions   amLODipine (NORVASC) 5 mg, Oral, Daily   apixaban (ELIQUIS) 5 mg, Oral, 2 times daily   atorvastatin (LIPITOR) 80 MG tablet TAKE 1 TABLET BY MOUTH EVERY DAY   diltiazem (CARDIZEM CD) 240 MG 24 hr capsule TAKE 1 CAPSULE BY MOUTH EVERY DAY   finasteride (PROSCAR) 5 mg, Oral, Daily   meclizine (ANTIVERT) 25 mg, Oral, 2 times daily PRN   metFORMIN (GLUCOPHAGE) 850 mg, Oral, 2 times daily with meals   metoprolol tartrate (LOPRESSOR) 50 MG tablet TAKE 1 TABLET BY MOUTH TWICE A DAY   Multiple Vitamin (MULTIVITAMIN WITH MINERALS) TABS tablet 1 tablet, Oral, Daily   Multiple Vitamins-Minerals (PRESERVISION/LUTEIN PO) 1 tablet, Oral, Daily   olopatadine (PATANOL) 0.1 % ophthalmic solution SMARTSIG:In Eye(s)   ONETOUCH  VERIO test strip CHECK BLOOD SUGAR NO MORE THAN TWICE DAILY   RESTASIS 0.05 % ophthalmic emulsion 1 drop, 2 times daily       Objective:   Physical Exam BP 130/68   Pulse 68   Temp 98.3 F (36.8 C) (Oral)   Resp 16   Ht '5\' 11"'$  (1.803 m)   Wt 148 lb (67.1 kg)   SpO2 93%   BMI 20.64 kg/m  General:   Well developed, NAD, BMI noted.  HEENT:  Normocephalic . Face symmetric, atraumatic Lungs:  CTA B Normal respiratory effort, no intercostal retractions, no accessory muscle use. Heart: RRR,  no murmur.  Abdomen:  Not distended, soft, non-tender. No rebound or rigidity.   Skin: Not pale. Not jaundice Lower extremities: no pretibial edema bilaterally  Neurologic:  alert & oriented X3.  Speech normal, gait appropriate for age and unassisted Psych--  Cognition and judgment appear intact.  Cooperative with normal attention span and concentration.  Behavior appropriate. No anxious or depressed appearing.     Assessment     Assessment DM  ---->  DX 07-2015, A1c 8.0; started metformin Neuropathy, mild.  DX 08-2017 (in sensitive type) HTN   Hyperlipidemia UROLOGY: BPH, increased PSA. 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  Hep C serologies negative, hep B surface Ab+, hep B core antibody negative (consistent with vaccination)  PLAN CMP A1c TSH   DM: Last A1c was 6.0.  Currently on metformin 850 mg twice daily.  Plan: Change to metformin 500 twice daily see next. Diarrhea : He first comment about it at the last visit, today he mentioned it again.  No weight loss noted, no blood in the stools.  He thinks is dietary related. Plan: Avoid dairy products and cereals completely, check  tTG IgA - total IgA level, decrease metformin dose.  Reassess in 2 months.   HTN: Seems well-controlled on Cardizem, metoprolol.  Amlodipine. Labs. BPH,  increased PSA: Saw urology 10/17/2021, they noted PSA was going down over the years and suspected his PSA density was normal.  Next follow-up 1 year   2===== Here for CPX DM: On metformin, feet exam showed neuropathy, feet care discussed.  Check A1c Diabetic neuropathy: See above HTN: Ambulatory BPs in the 130s.  Continue amlodipine, Cardizem, metoprolol. Atrial fibrillation, paroxysmal: Saw cardiology 07/17/2021, felt to be stable, continue Eliquis Hyperlipidemia: On Lipitor 80 mg, last LDL satisfactory.  Recheck on RTC. Stroke sequela: At baseline, uses a cane Diarrhea: Has episodic diarrhea, not every day, typically the days he has milk and cereal.  Lactose intolerance? Rec to avoid dairy products for 1 month and see the response. RTC 5 to 6 months

## 2022-01-28 LAB — IGA: Immunoglobulin A: 220 mg/dL (ref 70–320)

## 2022-01-28 LAB — CELIAC AB TTG DGP TIGA
Antigliadin Abs, IgA: 5 units (ref 0–19)
Gliadin IgG: 2 units (ref 0–19)
IgA/Immunoglobulin A, Serum: 236 mg/dL (ref 61–437)
Tissue Transglut Ab: 3 U/mL (ref 0–5)
Transglutaminase IgA: <2 U/mL (ref 0–3)

## 2022-01-28 NOTE — Assessment & Plan Note (Signed)
DM: Last A1c was 6.0.  Currently on metformin 850 mg twice daily.  Plan: Change to metformin 500 twice daily see next. Diarrhea : He first mentioned diarrhea at the last visit and today he raised the issue again..  No weight loss noted, no blood in the stools.  He thinks is dietary related. Plan: Avoid dairy products and cereals completely, check  tTG IgA - total IgA level, decrease metformin dose.  Reassess in 2 months.   HTN: Seems well-controlled on Cardizem, metoprolol.  Amlodipine.  BPH, increased PSA: Saw urology 10/17/2021, they noted PSA was going down over the years and suspected his PSA density was normal.  Next follow-up 1 year RTC 2 months

## 2022-02-14 ENCOUNTER — Other Ambulatory Visit: Payer: Self-pay | Admitting: Internal Medicine

## 2022-03-04 ENCOUNTER — Ambulatory Visit (INDEPENDENT_AMBULATORY_CARE_PROVIDER_SITE_OTHER): Payer: Medicare Other | Admitting: Podiatry

## 2022-03-04 DIAGNOSIS — M79674 Pain in right toe(s): Secondary | ICD-10-CM | POA: Diagnosis not present

## 2022-03-04 DIAGNOSIS — B351 Tinea unguium: Secondary | ICD-10-CM | POA: Diagnosis not present

## 2022-03-04 DIAGNOSIS — L84 Corns and callosities: Secondary | ICD-10-CM

## 2022-03-04 DIAGNOSIS — M79675 Pain in left toe(s): Secondary | ICD-10-CM | POA: Diagnosis not present

## 2022-03-04 DIAGNOSIS — E119 Type 2 diabetes mellitus without complications: Secondary | ICD-10-CM | POA: Diagnosis not present

## 2022-03-04 NOTE — Progress Notes (Signed)
  Subjective:  Patient ID: Marcus Beasley, male    DOB: 1946-03-11,  MRN: [id number]  Chief Complaint  Patient presents with   Nail Problem    Diabetic Foot Care     76 y.o. male returns with the above complaint. History confirmed with patient.  Overall doing well no new issues calluses and nails become thickened and painful again. Objective:  Physical Exam: warm, good capillary refill, no trophic changes or ulcerative lesions, normal DP and PT pulses and normal sensory exam. Onychomycosis is noted to all toenails with brown discoloration, thickening the nail plate and subungual debris Left Foot:  L dorsal PIPJ corn fifth toe  right Foot: medial hallux pinch tyloma, submetatarsal 5  Assessment:   1. Pain due to onychomycosis of toenails of both feet   2. Callus of foot   3. Type 2 diabetes mellitus without complication, without long-term current use of insulin ([place])        Plan:  Patient was evaluated and treated and all questions answered.  Patient educated on diabetes. Discussed proper diabetic foot care and discussed risks and complications of disease. Educated patient in depth on reasons to return to the office immediately should he/she discover anything concerning or new on the feet. All questions answered. Discussed proper shoes as well.   Discussed the etiology and treatment options for the condition in detail with the patient. Educated patient on the topical and oral treatment options for mycotic nails. Recommended debridement of the nails today. Sharp and mechanical debridement performed of all painful and mycotic nails today. Nails debrided in length and thickness using a nail nipper and a mechanical burr to level of comfort. Discussed treatment options including appropriate shoe gear. Follow up as needed for painful nails.  All symptomatic hyperkeratoses were safely debrided with a sterile #15 blade to patient's level of comfort without incident. We discussed preventative  and palliative care of these lesions including supportive and accommodative shoegear, padding, prefabricated and custom molded accommodative orthoses, use of a pumice stone and lotions/creams daily.  Silicone toe pads were dispensed for digital corns   Return in about 3 months (around 06/03/2022) for at risk diabetic foot care.

## 2022-03-14 ENCOUNTER — Other Ambulatory Visit: Payer: Self-pay | Admitting: Cardiology

## 2022-03-17 NOTE — Telephone Encounter (Signed)
Prescription refill request for Eliquis received. Indication: afib  Last office visit: [place], 07/17/2021 Scr: 0.97, 01/27/2022 Age: 76 yo  Weight: 67.1 kg  Refill sent.

## 2022-03-20 ENCOUNTER — Encounter: Payer: Self-pay | Admitting: Family Medicine

## 2022-03-20 ENCOUNTER — Telehealth (INDEPENDENT_AMBULATORY_CARE_PROVIDER_SITE_OTHER): Payer: Medicare Other | Admitting: Family Medicine

## 2022-03-20 ENCOUNTER — Telehealth: Payer: Self-pay

## 2022-03-20 DIAGNOSIS — U071 COVID-19: Secondary | ICD-10-CM

## 2022-03-20 NOTE — Progress Notes (Signed)
Chief Complaint  Patient presents with   Covid Positive    03/19/22 -- cough , headache    Marcus Beasley here for URI complaints. Due to COVID-19 pandemic, we are interacting via telephone. I verified patient's ID using 2 identifiers. Patient agreed to proceed with visit via this method. Patient is at home, I am at office. Patient and I are present for visit.   Duration: 7 d Associated symptoms: sinus headache, water eyes, runny nose, sore throat, diarrhea (could be related to dairy) and coughing Denies: sinus congestion, sinus pain, ear pain, ear drainage, wheezing, shortness of breath, myalgia, and loss of taste/smell, N/V Treatment to date: Coricidin Sick contacts: Yes ; daughter, grandson, wife Tested + for covid on 03/19/22.  Past Medical History:  Diagnosis Date   BPH (benign prostatic hyperplasia)    (-) Bx 2015   Diabetes mellitus without complication (HCC)    Elevated PSA    Prostate Bx in 06/2013 was benign   Glaucoma suspect    HTN (hypertension)    Hyperlipidemia    Macular degeneration, age related     Objective No conversational dyspnea Age appropriate judgment and insight Nml affect and mood  COVID-19  OK to cont Coricidin and to push fluids, practice good hand hygiene, cover mouth when coughing. Outside window for antiviral. Discussed CDC quarantining guidelines.  F/u prn. If starting to experience irreplaceable fluid loss, shaking, or shortness of breath, seek immediate care. Total time: 11 min Pt voiced understanding and agreement to the plan.  [place], DO 03/20/22 1:55 PM

## 2022-03-20 NOTE — Telephone Encounter (Signed)
Nurse Assessment Nurse: Lenon Curt, RN, Mel[place] Date/Time (Eastern Time): 03/19/2022 5:20:36 PM Confirm and document reason for call. If symptomatic, describe symptoms. ---Caller states tested positive for COVID about 15-20 minutes ago. Requesting treatment. Cough for 2 days. Coricidin HBP Does the patient have any new or worsening symptoms? ---Yes Will a triage be completed? ---Yes Related visit to physician within the last 2 weeks? ---No Does the PT have any chronic conditions? (i.e. diabetes, asthma, this includes High risk factors for pregnancy, etc.) ---Yes Is this a behavioral health or substance abuse call? ---No Guidelines Guideline Title Affirmed Question Affirmed Notes Nurse Date/Time ([place] Time) COVID-19 - Diagnosed or Suspected [1] HIGH RISK patient (e.g., weak immune system, age > 73 years, obesity with BMI 30 or higher, pregnant, chronic lung disease or other chronic medical condition) AND [2] COVID symptoms (e.g., cough, fever) Lenon Curt, RN, Threasa Beards 03/19/2022 5:22:34 PM PLEASE NOTE: All timestamps contained within this report are represented as [place] Standard Time. CONFIDENTIALTY NOTICE: This fax transmission is intended only for the addressee. It contains information that is legally privileged, confidential or otherwise protected from use or disclosure. If you are not the intended recipient, you are strictly prohibited from reviewing, disclosing, copying using or disseminating any of this information or taking any action in reliance on or regarding this information. If you have received this fax in error, please notify us immediately by telephone so that we can arrange for its return to [place]. Phone: [phone], Toll-Free: [phone], Fax: [phone] Page: 2 of 2 Call Id: [id number] Guidelines Guideline Title Affirmed Question Affirmed Notes Nurse Date/Time Eilene Ghazi Time) (Exceptions: Already seen by PCP and no new or worsening symptoms.) Disp. Time  Eilene Ghazi Time) Disposition Final User 03/19/2022 5:26:05 PM Call PCP within 24 Hours Yes Lenon Curt, RN, [place] Final Disposition 03/19/2022 5:26:05 PM Call PCP within 24 Hours Yes Lenon Curt, RN, Threasa Beards Caller Disagree/Comply Comply Caller Understands Yes PreDisposition Call Doctor Care Advice Given Per Guideline CALL PCP WITHIN 24 HOURS: * You need to discuss this with your doctor (or NP/PA) within the next 24 hours. * IF OFFICE WILL BE OPEN: Call the office when it opens tomorrow morning. ALTERNATE DISPOSITION - TELEMEDICINE WITHIN 24 HOURS: GENERAL CARE ADVICE FOR COVID-19 SYMPTOMS: COUGH MEDICINES: COVID-19 - HOW TO PROTECT OTHERS - WHEN YOU ARE SICK WITH COVID-19: CALL BACK IF: * You become worse CARE ADVICE given per COVID-19 - DIAGNOSED OR SUSPECTED (Adult) guideline. Comments User: Erik Obey, RN Date/Time Eilene Ghazi Time): 03/19/2022 5:22:17 PM 03/31/22 Referrals REFERRED TO PCP OFFICE

## 2022-03-20 NOTE — Telephone Encounter (Signed)
Spoke w/ Pt- scheduled w/ virtual visit with Dr. Nani Ravens today.

## 2022-03-31 ENCOUNTER — Encounter: Payer: Self-pay | Admitting: Internal Medicine

## 2022-03-31 ENCOUNTER — Ambulatory Visit (INDEPENDENT_AMBULATORY_CARE_PROVIDER_SITE_OTHER): Payer: Medicare Other | Admitting: Internal Medicine

## 2022-03-31 VITALS — BP 122/68 | HR 66 | Temp 98.1°F | Resp 18 | Ht 71.0 in | Wt 150.4 lb

## 2022-03-31 DIAGNOSIS — E785 Hyperlipidemia, unspecified: Secondary | ICD-10-CM

## 2022-03-31 DIAGNOSIS — Z23 Encounter for immunization: Secondary | ICD-10-CM

## 2022-03-31 DIAGNOSIS — I480 Paroxysmal atrial fibrillation: Secondary | ICD-10-CM | POA: Diagnosis not present

## 2022-03-31 DIAGNOSIS — E1159 Type 2 diabetes mellitus with other circulatory complications: Secondary | ICD-10-CM | POA: Diagnosis not present

## 2022-03-31 DIAGNOSIS — R197 Diarrhea, unspecified: Secondary | ICD-10-CM

## 2022-03-31 DIAGNOSIS — D649 Anemia, unspecified: Secondary | ICD-10-CM

## 2022-03-31 LAB — LIPID PANEL
Cholesterol: 140 mg/dL (ref 0–200)
HDL: 52.9 mg/dL (ref >39.00)
LDL Cholesterol: 77 mg/dL (ref 0–99)
NonHDL: 87.23
Total CHOL/HDL Ratio: 3
Triglycerides: 53 mg/dL (ref 0.0–149.0)
VLDL: 10.6 mg/dL (ref 0.0–40.0)

## 2022-03-31 LAB — CBC WITH DIFFERENTIAL/PLATELET
Basophils Absolute: 0 10*3/uL (ref 0.0–0.1)
Basophils Relative: 0.4 % (ref 0.0–3.0)
Eosinophils Absolute: 0 10*3/uL (ref 0.0–0.7)
Eosinophils Relative: 0.7 % (ref 0.0–5.0)
HCT: 38.6 % — ABNORMAL LOW (ref 39.0–52.0)
Hemoglobin: 12.8 g/dL — ABNORMAL LOW (ref 13.0–17.0)
Lymphocytes Relative: 20.2 % (ref 12.0–46.0)
Lymphs Abs: 0.9 10*3/uL (ref 0.7–4.0)
MCHC: 33.2 g/dL (ref 30.0–36.0)
MCV: 88.3 fl (ref 78.0–100.0)
Monocytes Absolute: 0.4 10*3/uL (ref 0.1–1.0)
Monocytes Relative: 9.5 % (ref 3.0–12.0)
Neutro Abs: 3.2 10*3/uL (ref 1.4–7.7)
Neutrophils Relative %: 69.2 % (ref 43.0–77.0)
Platelets: 252 10*3/uL (ref 150.0–400.0)
RBC: 4.37 Mil/uL (ref 4.22–5.81)
RDW: 14.2 % (ref 11.5–15.5)
WBC: 4.6 10*3/uL (ref 4.0–10.5)

## 2022-03-31 LAB — IRON: Iron: 61 ug/dL (ref 42–165)

## 2022-03-31 LAB — FERRITIN: Ferritin: 66.7 ng/mL (ref 22.0–322.0)

## 2022-03-31 NOTE — Assessment & Plan Note (Addendum)
  DM: A1c 2 months ago was 6.1, on metformin.  No change Diarrhea: See last visit, he is avoiding dairy products,  no further symptoms.  Blood work was unremarkable. Hyperlipidemia, on Lipitor, check FLP Mild anemia: Hemoglobin has been slightly low, recheck a CBC, iron and ferritin.  He is anticoagulated but denies GI symptoms. A-fib: Asymptomatic, has occasional chest pain as described above, recommend observation, definitely call if symptoms increase or change. Preventive care: Flu shot today. Had a mild COVID infection 2-1/2 weeks ago, recommend a COVID vaccine in 3 to 4 weeks RTC CPX 07-2022

## 2022-03-31 NOTE — Progress Notes (Signed)
Subjective:    Patient ID: Marcus Beasley, male    DOB: [birth year], 76 y.o.   MRN: [id number]  DOS:  03/31/2022 Type of visit - description: Follow-up  Since the last office visit, developed COVID approximately 2-1/2 weeks ago, very mild symptoms, no antivirals.  He feels fully recuperated. No fever chills just had some sore throat and a mild cough.   Wt Readings from Last 3 Encounters:  03/31/22 150 lb 6 oz (68.2 kg)  01/27/22 148 lb (67.1 kg)  01/08/22 146 lb 3.2 oz (66.3 kg)     Review of Systems Denies nausea vomiting. No blood in the stool. No  lower extremity edema or palpitation. When asked about chest pain, reports that for months he is having a fleeting pain at the center of the chest, may last few seconds, goes away with Vicks VapoRub.  No exertional symptoms.  Past Medical History:  Diagnosis Date   BPH (benign prostatic hyperplasia)    (-) Bx 2015   Diabetes mellitus without complication (HCC)    Elevated PSA    Prostate Bx in 06/2013 was benign   Glaucoma suspect    HTN (hypertension)    Hyperlipidemia    Macular degeneration, age related     Past Surgical History:  Procedure Laterality Date   ABCESS DRAINAGE     abdomen- 26 day hospitalization 1968   COLONOSCOPY  2016   HERNIA REPAIR  summer '11   umbilical, dr Ninfa Linden    POLYPECTOMY     PROSTATE BIOPSY  06-2013 , 07-2017   (-), (-)    Current Outpatient Medications  Medication Instructions   amLODipine (NORVASC) 5 mg, Oral, Daily   atorvastatin (LIPITOR) 80 MG tablet TAKE 1 TABLET BY MOUTH EVERY DAY   diltiazem (CARDIZEM CD) 240 mg, Oral, Daily   Eliquis 5 mg, Oral, 2 times daily   finasteride (PROSCAR) 5 mg, Oral, Daily   meclizine (ANTIVERT) 25 mg, Oral, 2 times daily PRN   metFORMIN (GLUCOPHAGE) 500 mg, Oral, Daily with breakfast   metoprolol tartrate (LOPRESSOR) 50 MG tablet TAKE 1 TABLET BY MOUTH TWICE A DAY   Multiple Vitamin (MULTIVITAMIN WITH MINERALS) TABS tablet 1 tablet, Oral, Daily    Multiple Vitamins-Minerals (PRESERVISION/LUTEIN PO) 1 tablet, Oral, Daily   olopatadine (PATANOL) 0.1 % ophthalmic solution SMARTSIG:In Eye(s)   ONETOUCH VERIO test strip CHECK BLOOD SUGAR NO MORE THAN TWICE DAILY   RESTASIS 0.05 % ophthalmic emulsion 1 drop, 2 times daily       Objective:   Physical Exam BP 122/68   Pulse 66   Temp 98.1 F (36.7 C) (Oral)   Resp 18   Ht '5\' 11"'$  (1.803 m)   Wt 150 lb 6 oz (68.2 kg)   SpO2 96%   BMI 20.97 kg/m  General:   Well developed, NAD, BMI noted. HEENT:  Normocephalic . Face symmetric, atraumatic Lungs:  CTA B Normal respiratory effort, no intercostal retractions, no accessory muscle use. Heart: RRR,  no murmur.  Lower extremities: no pretibial edema bilaterally  Skin: Not pale. Not jaundice Neurologic:  alert & oriented X3.  Speech normal, gait appropriate for age and unassisted Psych--  Cognition and judgment appear intact.  Cooperative with normal attention span and concentration.  Behavior appropriate. No anxious or depressed appearing.      Assessment     Assessment DM  ---->  DX 07-2015, A1c 8.0; started metformin Neuropathy, mild.  DX 08-2017 (in sensitive type) HTN   Hyperlipidemia UROLOGY: BPH,  increased PSA. 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  Hep C serologies negative, hep B surface Ab+, hep B core antibody negative (consistent with vaccination)  PLAN DM: A1c 2 months ago was 6.1, on metformin.  No change Diarrhea: See last visit, he is avoiding dairy products,  no further symptoms.  Blood work was unremarkable. Hyperlipidemia, on Lipitor, check FLP Mild anemia: Hemoglobin has been slightly low, recheck a CBC, iron and ferritin.  He is anticoagulated but denies GI symptoms. A-fib: Asymptomatic, has occasional chest pain as described above, recommend observation,  definitely call if symptoms increase or change. Preventive care: Flu shot today. Had a mild COVID infection 2-1/2 weeks ago, recommend a COVID vaccine in 3 to 4 weeks RTC CPX 07-2022

## 2022-03-31 NOTE — Patient Instructions (Addendum)
Vaccines I recommend:  Shingrix (shingles) #2 Covid booster in 3 to 4 weeks     Check the  blood pressure regularly BP GOAL is between 110/65 and  135/85. If it is consistently higher or lower, let me know  GO TO THE LAB : Get the blood work     [place]in[place], [place] back for a physical exam by February 2024

## 2022-04-02 MED ORDER — EZETIMIBE 10 MG PO TABS: 10.0000 mg | ORAL_TABLET | Freq: Every day | ORAL | 3 refills | Status: DC

## 2022-04-02 NOTE — Addendum Note (Signed)
Addended byDamita Dunnings D on: 04/02/2022 09:31 AM   Modules accepted: Orders

## 2022-04-17 ENCOUNTER — Telehealth: Payer: Self-pay | Admitting: *Deleted

## 2022-04-17 ENCOUNTER — Encounter: Payer: Self-pay | Admitting: *Deleted

## 2022-04-17 NOTE — Patient Outreach (Signed)
  Care Coordination   04/17/2022 Name: Marcus Beasley MRN: [id number] DOB: [birth year]   Care Coordination Outreach Attempts:  An unsuccessful telephone outreach was attempted today to offer the patient information about available care coordination services as a benefit of their health plan.   Follow Up Plan:  Additional outreach attempts will be made to offer the patient care coordination information and services.   Encounter Outcome:  No Answer attempted calls x 3 to patient's preferred number- initially received busy signal; other 2 outreach attempts appeared to have been answered, but no one was present on other end of phone  Care Coordination Interventions Activated:  No   Care Coordination Interventions:  No, not indicated unsuccessful outreach attempt # 1    Oneta Rack, RN, BSN, CCRN Alumnus RN CM Care Coordination/ [facility] [place] [facility] [phone]: direct office

## 2022-04-18 ENCOUNTER — Encounter: Payer: Self-pay | Admitting: *Deleted

## 2022-04-18 ENCOUNTER — Telehealth: Payer: Self-pay | Admitting: *Deleted

## 2022-04-18 NOTE — Patient Instructions (Signed)
Visit Information  Thank you for taking time to visit with me today. Please don't hesitate to contact me if I can be of assistance to you.   Following are the goals we discussed today:   Goals Addressed             This Visit's Progress    COMPLETED: Care Coordination activities: No follow up required   On track    Care Coordination Interventions: Evaluation of current treatment plan related to HTN and patient's adherence to plan as established by provider Advised patient to provide appropriate vaccination information to provider or CM team member at next visit Advised patient to consider getting COVID booster vaccine as advised by PCP 03/31/22- reports he will do soon Provided education to patient re: significance of cholesterol lab values; foods high in cholesterol he should avoid; provided education around same via MyChart  Reviewed scheduled/upcoming provider appointments including 06/03/22- podiatry Assessed social determinant of health barriers Confirmed patient attended Medicare Annual Wellness visit 01/08/22; reviewed recent PCP office visit 03/31/22; confirmed patient obtained flu vaccine for 2023-24 flu/ winter season; confirmed patient taking newly prescribed Zetia; reviewed recent home blood pressures with patient and encouraged ongoing home monitoring of blood pressures          If you are experiencing a Mental Health or [place] or need someone to talk to, please call the Suicide and Crisis Lifeline: 988 call the [place] National Suicide Prevention Lifeline: (952)737-3808 or TTY: 306-224-7761 TTY 956-188-9436) to talk to a trained counselor call 1-800-273-TALK (toll free, 24 hour hotline) go to [facility] [address], [place] [phone]) call the [place]: [phone] call 911   Patient verbalizes understanding of instructions and care plan provided today and agrees to view in [place].  Active MyChart status and patient understanding of how to access instructions and care plan via MyChart confirmed with patient.     No further follow up required: no further or ongoing care coordination needs identified  Oneta Rack, RN, BSN, CCRN Alumnus RN CM Care Coordination/ Transition of [place] Management [phone]: direct office

## 2022-04-18 NOTE — Patient Outreach (Signed)
  Care Coordination   Initial Visit Note   04/18/2022 Name: Marcus Beasley MRN: [id number] DOB: 1946/04/28  Marcus Beasley is a 76 y.o. year old male who sees Colon Branch, MD for primary care. I spoke with  [name] by phone today.  What matters to the patients health and wellness today?  "I am doing just fine; I started taking the cholesterol medication he prescribed for me and am checking my blood pressures every day at home-- they are running between 130-140/ 70-80.  I don't have any questions about my medicines, taking them all just the way he told me to.  I will get my COVID booster done soon"  Interventions provided; no further or ongoing care coordination needs identified today    Goals Addressed             This Visit's Progress    COMPLETED: Care Coordination activities: No follow up required   On track    Care Coordination Interventions: Evaluation of current treatment plan related to HTN and patient's adherence to plan as established by provider Advised patient to provide appropriate vaccination information to provider or CM team member at next visit Advised patient to consider getting COVID booster vaccine as advised by PCP 03/31/22- reports he will do soon Provided education to patient re: significance of cholesterol lab values; foods high in cholesterol he should avoid; provided education around same via [place] scheduled/upcoming provider appointments including 06/03/22- podiatry Assessed social determinant of health barriers Confirmed patient attended Medicare Annual Wellness visit 01/08/22; reviewed recent PCP office visit 03/31/22; confirmed patient obtained flu vaccine for 2023-24 flu/ winter season; confirmed patient taking newly prescribed Zetia; reviewed recent home blood pressures with patient and encouraged ongoing home monitoring of blood pressures          SDOH assessments and interventions completed:  Yes  SDOH Interventions Today     Flowsheet Row Most Recent Value  SDOH Interventions   Food Insecurity Interventions Intervention Not Indicated  Transportation Interventions Intervention Not Indicated  [drives self short distances]       Care Coordination Interventions Activated:  Yes  Care Coordination Interventions:  Yes, provided   Follow up plan: No further intervention required.   Encounter Outcome:  Pt. Visit Completed   Oneta Rack, RN, BSN, CCRN Alumnus RN CM Care Coordination/ Transition of [place] Management [phone]: direct office

## 2022-05-21 ENCOUNTER — Encounter: Payer: Self-pay | Admitting: *Deleted

## 2022-06-03 ENCOUNTER — Ambulatory Visit: Payer: Medicare Other | Admitting: Podiatry

## 2022-06-03 DIAGNOSIS — M79675 Pain in left toe(s): Secondary | ICD-10-CM

## 2022-06-03 DIAGNOSIS — M21612 Bunion of left foot: Secondary | ICD-10-CM

## 2022-06-03 DIAGNOSIS — B351 Tinea unguium: Secondary | ICD-10-CM | POA: Diagnosis not present

## 2022-06-03 DIAGNOSIS — M2042 Other hammer toe(s) (acquired), left foot: Secondary | ICD-10-CM

## 2022-06-03 DIAGNOSIS — L84 Corns and callosities: Secondary | ICD-10-CM | POA: Diagnosis not present

## 2022-06-03 DIAGNOSIS — E119 Type 2 diabetes mellitus without complications: Secondary | ICD-10-CM | POA: Diagnosis not present

## 2022-06-03 DIAGNOSIS — M79674 Pain in right toe(s): Secondary | ICD-10-CM

## 2022-06-03 DIAGNOSIS — M2041 Other hammer toe(s) (acquired), right foot: Secondary | ICD-10-CM

## 2022-06-03 DIAGNOSIS — M2012 Hallux valgus (acquired), left foot: Secondary | ICD-10-CM

## 2022-06-04 NOTE — Progress Notes (Addendum)
  Subjective:  Patient ID: Marcus Beasley, male    DOB: [birth year],  MRN: [id number]  Chief Complaint  Patient presents with   Nail Problem    Thick painful toenails, 3 month follow up    76 y.o. male returns with the above complaint. History confirmed with patient.  Overall doing well no new issues calluses and nails become thickened and painful again. Calluses painful as well.   Objective:  Physical Exam: warm, good capillary refill, no trophic changes or ulcerative lesions, normal DP and PT pulses and normal sensory exam. Onychomycosis is noted to all toenails with brown discoloration, thickening the nail plate and subungual debris.  He has a bunion on the left foot and hammertoes that are similar reducible in nature bilateral Left Foot:  L dorsal PIPJ corn fifth toe  right Foot: medial hallux pinch tyloma, submetatarsal 5  Assessment:   1. Pain due to onychomycosis of toenails of both feet   2. Callus of foot   3. Type 2 diabetes mellitus without complication, without long-term current use of insulin (HCC)   4. Hallux valgus with bunions, left   5. Hammertoe of left foot   6. Hammertoe of right foot        Plan:  Patient was evaluated and treated and all questions answered.  Patient educated on diabetes. Discussed proper diabetic foot care and discussed risks and complications of disease. Educated patient in depth on reasons to return to the office immediately should he/she discover anything concerning or new on the feet. All questions answered. Discussed proper shoes as well.  With his multiple foot deformities and diabetes he likely would benefit and has benefited from diabetic extra-depth shoes with multidensity insoles.  He will be scheduled for casting for these.  Discussed the etiology and treatment options for the condition in detail with the patient. Educated patient on the topical and oral treatment options for mycotic nails. Recommended debridement of the nails today.  Sharp and mechanical debridement performed of all painful and mycotic nails today. Nails debrided in length and thickness using a nail nipper and a mechanical burr to level of comfort. Discussed treatment options including appropriate shoe gear. Follow up as needed for painful nails.  All symptomatic hyperkeratoses were safely debrided with a sterile #15 blade to patient's level of comfort without incident. We discussed preventative and palliative care of these lesions including supportive and accommodative shoegear, padding, prefabricated and custom molded accommodative orthoses, use of a pumice stone and lotions/creams daily.  Silicone toe pads were dispensed for digital corns   Return in about 4 months (around 10/03/2022) for painful toenails.

## 2022-06-07 ENCOUNTER — Other Ambulatory Visit: Payer: Self-pay | Admitting: Internal Medicine

## 2022-06-09 ENCOUNTER — Telehealth: Payer: Self-pay | Admitting: Cardiology

## 2022-06-09 NOTE — Telephone Encounter (Signed)
Pt c/o medication issue:  1. Name of Medication: ELIQUIS 5 MG TABS tablet   2. How are you currently taking this medication (dosage and times per day)?    3. Are you having a reaction (difficulty breathing--STAT)? no  4. What is your medication issue? Calling to see if our office have any samples. Please advise

## 2022-06-09 NOTE — Telephone Encounter (Signed)
Patient requested samples of eliquis '5mg'$  twice daily. Approved by PharmD K. Alvstad. Four boxes of eliquis '5mg'$  set aside for patient. Lot #: [id number]; Exp: 7/25. He stated he will get them today. Also, he stated he will bring new patient assistance for eliquis in February.

## 2022-07-22 NOTE — Progress Notes (Signed)
Cardiology Office Note    Date:  07/31/2022   ID:  Beasley, Marcus 01-04-1946, MRN [id number]  PCP:  Colon Branch, MD  Cardiologist:  Dr. Martinique  Chief Complaint  Patient presents with   Atrial Fibrillation    History of Present Illness:  Marcus Beasley is a 77 y.o. male seen for follow up Afib, HTN and prior hemorrhagic CVA. He has a  past medical history of hypertension, DM 2 and hyperlipidemia who presented on 03/12/2018 was facial droop, slurred speech and left leg weakness.  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  This was attributed to uncontrolled high blood pressure.  During the admission, patient developed paroxysmal atrial fibrillation and was placed on 180 mg daily of diltiazem. [place] Vasc score of 5. He was not placed on systemic anticoagulation initially due to the brain bleed. Later CT showed resolution of hemorrhage and that it would be safe to resume anticoagulation.   On follow up today he still notes stiffness on his left side. He is active. Denies any palpitations, dizziness, chest pain or dyspnea. No bleeding or new CVA symptoms. He is tolerating his medication well.    Past Medical History:  Diagnosis Date   BPH (benign prostatic hyperplasia)    (-) Bx 2015   Diabetes mellitus without complication (HCC)    Elevated PSA    Prostate Bx in 06/2013 was benign   Glaucoma suspect    HTN (hypertension)    Hyperlipidemia    Macular degeneration, age related     Past Surgical History:  Procedure Laterality Date   ABCESS DRAINAGE     abdomen- 26 day hospitalization 1968   COLONOSCOPY  2016   HERNIA REPAIR  summer '11   umbilical, dr Ninfa Linden    POLYPECTOMY     PROSTATE BIOPSY  06-2013 , 07-2017   (-), (-)    Current Medications: Outpatient Medications Prior to Visit  Medication Sig Dispense Refill   amLODipine (NORVASC) 5 MG tablet Take 1 tablet (5 mg total) by mouth daily. 90 tablet 1   atorvastatin (LIPITOR) 80 MG tablet TAKE 1  TABLET BY MOUTH EVERY DAY 90 tablet 3   diltiazem (CARDIZEM CD) 240 MG 24 hr capsule Take 1 capsule (240 mg total) by mouth daily. 90 capsule 1   ELIQUIS 5 MG TABS tablet TAKE 1 TABLET BY MOUTH TWICE A DAY 180 tablet 1   ezetimibe (ZETIA) 10 MG tablet Take 1 tablet (10 mg total) by mouth daily. 90 tablet 3   finasteride (PROSCAR) 5 MG tablet Take 5 mg by mouth daily.     meclizine (ANTIVERT) 25 MG tablet TAKE 1 TABLET (25 MG TOTAL) BY MOUTH 2 (TWO) TIMES DAILY AS NEEDED FOR DIZZINESS. 30 tablet 1   metFORMIN (GLUCOPHAGE) 500 MG tablet Take 1 tablet (500 mg total) by mouth daily with breakfast. 90 tablet 1   metoprolol tartrate (LOPRESSOR) 50 MG tablet TAKE 1 TABLET BY MOUTH TWICE A DAY 180 tablet 3   Multiple Vitamin (MULTIVITAMIN WITH MINERALS) TABS tablet Take 1 tablet by mouth daily.      Multiple Vitamins-Minerals (PRESERVISION/LUTEIN PO) Take 1 tablet by mouth daily.      olopatadine (PATANOL) 0.1 % ophthalmic solution SMARTSIG:In Eye(s)     ONETOUCH VERIO test strip CHECK BLOOD SUGAR NO MORE THAN TWICE DAILY 200 strip 12   RESTASIS 0.05 % ophthalmic emulsion 1 drop 2 (two) times daily.     No facility-administered medications prior  to visit.     Allergies:   Patient has no known allergies.   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 beer   Drug use: No   Sexual activity: Yes    Partners: Female  Other Topics Concern   Not on file  Social History Narrative   HS[employer]. [employer]    Married - '69. 2 dtrs , 1 son - homicide. 5 grandchildren.     Household: pt, wife, daughter  [name]) and g-son     Driving limited as off  07-2021    Social Determinants of Health   Financial Resource Strain: Low Risk  (01/08/2022)   Overall Financial Resource Strain (CARDIA)    Difficulty of Paying Living Expenses: Not hard at all  Food Insecurity: No Food Insecurity (04/18/2022)   Hunger Vital Sign    Worried About Running Out of Food in the Last Year: Never true    Ran Out of Food in the Last Year: Never true  Transportation Needs: No Transportation Needs (04/18/2022)   PRAPARE - Hydrologist (Medical): No    Lack of Transportation (Non-Medical): No  Physical Activity: Sufficiently Active (01/08/2022)   Exercise Vital Sign    Days of Exercise per Week: 6 days    Minutes of Exercise per Session: 30 min  Stress: No Stress Concern Present (01/08/2022)   [place]    Feeling of Stress : Not at all  Social Connections: Moderately Integrated (01/08/2022)   Social Connection and Isolation Panel [NHANES]    Frequency of Communication with Friends and Family: More than three times a week    Frequency of Social Gatherings with Friends and Family: More than three times a week    Attends Religious Services: More than 4 times per year    Active Member of [employer] or Organizations: No    Attends Music therapist: Never    Marital Status: Married     Family History:  The patient's family history includes Cancer - Other in his sister; Diabetes in his maternal grandmother; Heart attack in his father; Heart disease in his brother and sister; Kidney disease in his brother; Leukemia in his mother.   ROS:   Please see the history of present illness.    ROS All other systems reviewed and are negative.   PHYSICAL EXAM:   VS:  BP 118/76   Pulse 84   Ht '5\' 11"'$  (1.803 m)   Wt 164 lb (74.4 kg)   BMI 22.87 kg/m    GENERAL:  Well appearing BM in NAD HEENT:  PERRL, EOMI, sclera are clear.  Oropharynx is clear. NECK:  No jugular venous distention, carotid upstroke brisk and symmetric, no bruits, no thyromegaly or adenopathy LUNGS:  Clear to auscultation bilaterally CHEST:  Unremarkable HEART:  RRR,  PMI not displaced or sustained,S1 and S2 within normal limits, no S3, no S4: no clicks, no rubs, no murmurs ABD:  Soft, nontender. BS +,  no masses or bruits. No hepatomegaly, no splenomegaly EXT:  2 + pulses throughout, no edema, no cyanosis no clubbing SKIN:  Warm and dry.  No rashes NEURO:  Alert and oriented x 3. Cranial nerves II through XII intact. Some left sided weakness persists. PSYCH:  Cognitively intact    Wt Readings from Last 3 Encounters:  07/31/22 164 lb (74.4 kg)  03/31/22 150 lb 6 oz (68.2 kg)  01/27/22 148 lb (67.1 kg)      Studies/Labs Reviewed:   EKG:  EKG is ordered today.  NSR rate 84. Normal. I have personally reviewed and interpreted this study.   Recent Labs: 01/27/2022: ALT 18; BUN 20; Creatinine, Ser 0.97; Potassium 4.7; Sodium 145; TSH 1.09 03/31/2022: Hemoglobin 12.8; Platelets 252.0   Lipid Panel    Component Value Date/Time   CHOL 140 03/31/2022 0902   CHOL 141 01/20/2019 0922   TRIG 53.0 03/31/2022 0902   HDL 52.90 03/31/2022 0902   HDL 44 01/20/2019 0922   CHOLHDL 3 03/31/2022 0902   VLDL 10.6 03/31/2022 0902   LDLCALC 77 03/31/2022 0902   LDLCALC 85 01/20/2019 0922    Additional studies/ records that were reviewed today include:   Echo 03/13/2018 LV EF: 65% -   70% Study Conclusions   - Left ventricle: The cavity size was normal. Wall thickness was   increased in a pattern of mild LVH. Systolic function was   vigorous. The estimated ejection fraction was in the range of 65%   to 70%. Wall motion was normal; there were no regional wall   motion abnormalities. - Aortic valve: There was trivial regurgitation.    ASSESSMENT:    1. PAF (paroxysmal atrial fibrillation) ([place])   2. Essential hypertension   3. Hyperlipidemia,  unspecified hyperlipidemia type   4. H/O: CVA (cerebrovascular accident)      PLAN:  In order of problems listed above:  1. PAF: continue Eliquis 5 mg bid long term. Continue BP control. Asymptomatic.   2. History of CVA: s/p hemorrhagic stroke involving right thalamus. Likely hypertensive but he did have Afib. Continue Eliquis  3. Hypertension: Blood pressure is well controlled.   4. Hyperlipidemia: last LDL 77. Goal < 70. Continue lipitor 40 mg.   5. DM2: Managed by primary care provider.  On metformin. A1c 6.1%.   I will see in one year   Medication Adjustments/Labs and Tests Ordered: Current medicines are reviewed at length with the patient today.  Concerns regarding medicines are outlined above.  Medication changes, Labs and Tests ordered today are listed in the Patient Instructions below. There are no Patient Instructions on file for this visit.   Signed, Khalin Royce Martinique, MD  07/31/2022 7:59 AM    [facility] [place], [place],   [address] Phone: [phone]; Fax: [phone]

## 2022-07-28 ENCOUNTER — Other Ambulatory Visit: Payer: Self-pay | Admitting: Cardiology

## 2022-07-28 ENCOUNTER — Other Ambulatory Visit: Payer: Self-pay | Admitting: Internal Medicine

## 2022-07-31 ENCOUNTER — Ambulatory Visit (INDEPENDENT_AMBULATORY_CARE_PROVIDER_SITE_OTHER): Payer: Medicare Other

## 2022-07-31 ENCOUNTER — Ambulatory Visit: Payer: Medicare Other | Attending: Cardiology | Admitting: Cardiology

## 2022-07-31 ENCOUNTER — Encounter: Payer: Self-pay | Admitting: Cardiology

## 2022-07-31 VITALS — BP 118/76 | HR 84 | Ht 71.0 in | Wt 164.0 lb

## 2022-07-31 DIAGNOSIS — M2041 Other hammer toe(s) (acquired), right foot: Secondary | ICD-10-CM

## 2022-07-31 DIAGNOSIS — M2012 Hallux valgus (acquired), left foot: Secondary | ICD-10-CM

## 2022-07-31 DIAGNOSIS — M2042 Other hammer toe(s) (acquired), left foot: Secondary | ICD-10-CM

## 2022-07-31 DIAGNOSIS — I480 Paroxysmal atrial fibrillation: Secondary | ICD-10-CM

## 2022-07-31 DIAGNOSIS — Z8673 Personal history of transient ischemic attack (TIA), and cerebral infarction without residual deficits: Secondary | ICD-10-CM

## 2022-07-31 DIAGNOSIS — E119 Type 2 diabetes mellitus without complications: Secondary | ICD-10-CM

## 2022-07-31 DIAGNOSIS — M21612 Bunion of left foot: Secondary | ICD-10-CM

## 2022-07-31 DIAGNOSIS — E785 Hyperlipidemia, unspecified: Secondary | ICD-10-CM

## 2022-07-31 DIAGNOSIS — I10 Essential (primary) hypertension: Secondary | ICD-10-CM | POA: Diagnosis not present

## 2022-07-31 NOTE — Progress Notes (Signed)
Patient presents to the office today for diabetic shoe and insole measuring.  Patient was measured with brannock device to determine size and width for 1 pair of extra depth shoes and foam casted for 3 pair of insoles.   ABN signed.   Documentation of medical necessity will be sent to patient's treating diabetic doctor to verify and sign.   Patient's diabetic provider: Colon Branch, MD   Shoes and insoles will be ordered at that time and patient will be notified for an appointment for fitting when they arrive.    Patient shoe selection-   1st   Shoe choice:   Touchet size ordered: 10 W

## 2022-07-31 NOTE — Addendum Note (Signed)
Addended bySherryle Lis, Luticia Tadros R on: 07/31/2022 12:14 PM   Modules accepted: Orders

## 2022-07-31 NOTE — Patient Instructions (Signed)
Medication Instructions:   Your physician recommends that you continue on your current medications as directed. Please refer to the Current Medication list given to you today.   *If you need a refill on your cardiac medications before your next appointment, please call your pharmacy*   Lab Work: [place]   If you have labs (blood work) drawn today and your tests are completely normal, you will receive your results only by: [place] (if you have MyChart) OR A paper copy in the mail If you have any lab test that is abnormal or we need to change your treatment, we will call you to review the results.   Testing/Procedures: NONE ORDERED  TODAY    Follow-Up: At [facility], you and your health needs are our priority.  As part of our continuing mission to provide you with exceptional heart care, we have created designated Provider Care Teams.  These Care Teams include your primary Cardiologist (physician) and Advanced Practice Providers (APPs -  Physician Assistants and Nurse Practitioners) who all work together to provide you with the care you need, when you need it.  We recommend signing up for the patient portal called "MyChart".  Sign up information is provided on this After Visit Summary.  MyChart is used to connect with patients for Virtual Visits (Telemedicine).  Patients are able to view lab/test results, encounter notes, upcoming appointments, etc.  Non-urgent messages can be sent to your provider as well.   To learn more about what you can do with MyChart, go to [email or web address].    Your next appointment:   1 year(s)  Provider:   Peter Martinique, MD     Other Instructions

## 2022-08-05 ENCOUNTER — Ambulatory Visit (INDEPENDENT_AMBULATORY_CARE_PROVIDER_SITE_OTHER): Payer: Medicare Other | Admitting: Internal Medicine

## 2022-08-05 ENCOUNTER — Encounter: Payer: Self-pay | Admitting: Internal Medicine

## 2022-08-05 VITALS — BP 122/84 | HR 73 | Temp 98.0°F | Resp 18 | Ht 71.0 in | Wt 166.4 lb

## 2022-08-05 DIAGNOSIS — E785 Hyperlipidemia, unspecified: Secondary | ICD-10-CM

## 2022-08-05 DIAGNOSIS — E1159 Type 2 diabetes mellitus with other circulatory complications: Secondary | ICD-10-CM

## 2022-08-05 DIAGNOSIS — I10 Essential (primary) hypertension: Secondary | ICD-10-CM

## 2022-08-05 LAB — BASIC METABOLIC PANEL
BUN: 18 mg/dL (ref 6–23)
CO2: 28 mEq/L (ref 19–32)
Calcium: 9.7 mg/dL (ref 8.4–10.5)
Chloride: 105 mEq/L (ref 96–112)
Creatinine, Ser: 1.13 mg/dL (ref 0.40–1.50)
GFR: 62.49 mL/min (ref >60.00)
Glucose, Bld: 86 mg/dL (ref 70–99)
Potassium: 4.6 mEq/L (ref 3.5–5.1)
Sodium: 140 mEq/L (ref 135–145)

## 2022-08-05 LAB — HEMOGLOBIN A1C: Hgb A1c MFr Bld: 6.5 % (ref 4.6–6.5)

## 2022-08-05 LAB — LIPID PANEL
Cholesterol: 112 mg/dL (ref 0–200)
HDL: 48.8 mg/dL (ref >39.00)
LDL Cholesterol: 53 mg/dL (ref 0–99)
NonHDL: 63.26
Total CHOL/HDL Ratio: 2
Triglycerides: 52 mg/dL (ref 0.0–149.0)
VLDL: 10.4 mg/dL (ref 0.0–40.0)

## 2022-08-05 NOTE — Patient Instructions (Addendum)
Please read the information about fall prevention Use a cane every day  Vaccines I recommend:  Shingrix (shingles) #2 Covid booster RSV vaccine    Check the  blood pressure regularly BP GOAL is between 110/65 and  135/85. If it is consistently higher or lower, let me know    GO TO THE LAB : Get the blood work     [place], [place] back for   a physical exam in 4 months   Per our records you are due for your diabetic eye exam. Please contact your eye doctor to schedule an appointment. Please have them send copies of your office visit notes to [place]. Our fax number is [phone]. If you need a referral to an eye doctor please let us know.

## 2022-08-05 NOTE — Progress Notes (Signed)
Subjective:    Patient ID: Marcus Beasley, male    DOB: 1946-02-11, 77 y.o.   MRN: [id number]  DOS:  08/05/2022 Type of visit - description: Follow-up  Since the last office visit is doing well. Denies diarrhea No chest pain no difficulty breathing No lower extremity edema Has not been dizzy in months.  Had weight loss last year, he is happy that the weight is coming back. Denies paresthesias  Wt Readings from Last 3 Encounters:  08/05/22 166 lb 6 oz (75.5 kg)  07/31/22 164 lb (74.4 kg)  03/31/22 150 lb 6 oz (68.2 kg)    Review of Systems See above   Past Medical History:  Diagnosis Date   BPH (benign prostatic hyperplasia)    (-) Bx 2015   Diabetes mellitus without complication (HCC)    Elevated PSA    Prostate Bx in 06/2013 was benign   Glaucoma suspect    HTN (hypertension)    Hyperlipidemia    Macular degeneration, age related     Past Surgical History:  Procedure Laterality Date   ABCESS DRAINAGE     abdomen- 26 day hospitalization 1968   COLONOSCOPY  2016   HERNIA REPAIR  summer '11   umbilical, dr Ninfa Linden    POLYPECTOMY     PROSTATE BIOPSY  06-2013 , 07-2017   (-), (-)    Current Outpatient Medications  Medication Instructions   amLODipine (NORVASC) 5 mg, Oral, Daily   atorvastatin (LIPITOR) 80 MG tablet TAKE 1 TABLET BY MOUTH EVERY DAY   diltiazem (CARDIZEM CD) 240 mg, Oral, Daily   Eliquis 5 mg, Oral, 2 times daily   ezetimibe (ZETIA) 10 mg, Oral, Daily   finasteride (PROSCAR) 5 mg, Oral, Daily   meclizine (ANTIVERT) 25 mg, Oral, 2 times daily PRN   metFORMIN (GLUCOPHAGE) 500 mg, Oral, Daily with breakfast   metoprolol tartrate (LOPRESSOR) 50 MG tablet TAKE 1 TABLET BY MOUTH TWICE A DAY   Multiple Vitamin (MULTIVITAMIN WITH MINERALS) TABS tablet 1 tablet, Oral, Daily   Multiple Vitamins-Minerals (PRESERVISION/LUTEIN PO) 1 tablet, Oral, Daily   olopatadine (PATANOL) 0.1 % ophthalmic solution SMARTSIG:In Eye(s)   ONETOUCH VERIO test strip CHECK  BLOOD SUGAR NO MORE THAN TWICE DAILY   RESTASIS 0.05 % ophthalmic emulsion 1 drop, 2 times daily       Objective:   Physical Exam BP 122/84   Pulse 73   Temp 98 F (36.7 C) (Oral)   Resp 18   Ht 5' 11"$  (1.803 m)   Wt 166 lb 6 oz (75.5 kg)   SpO2 95%   BMI 23.20 kg/m  General:   Well developed, NAD, BMI noted.  HEENT:  Normocephalic . Face symmetric, atraumatic Neck: No thyromegaly Lungs:  CTA B Normal respiratory effort, no intercostal retractions, no accessory muscle use. Heart: RRR,  no murmur.  Abdomen:  Not distended, soft, non-tender. No rebound or rigidity.   Skin: Not pale. Not jaundice DM foot exam: No edema, good pedal pulses, multiple toe deformities, some calluses noted. Neurologic:  alert & oriented X3.  Speech normal, gait appropriate, needs help transferring.   Psych--  Cognition and judgment appear intact.  Cooperative with normal attention span and concentration.  Behavior appropriate. No anxious or depressed appearing.     Assessment     Assessment DM  ---->  DX 07-2015, A1c 8.0; started metformin Neuropathy, mild.  DX 08-2017 (in sensitive type) HTN   Hyperlipidemia UROLOGY: BPH, increased PSA. 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  Hep C serologies negative, hep B surface Ab+, hep B core antibody negative (consistent with vaccination)  PLAN\ DM: Currently on metformin, check A1c. Feet exam: No neuropathy, has toe deformities and callouses  HTN: On amlodipine, Cardizem, metoprolol.  Check BMP. High cholesterol: He is on atorvastatin 80 mg, based on last LDL Zetia was added.  Check FLP. Mild anemia: See LOV, iron and ferritin were okay. PAF Saw cardiology 07/31/2022, Rx to continue anticoagulation. Diarrhea: See LOV, resolved. Preventive care: Fall prevention discussed, has a cane, encouraged to use  it daily. Vaccines advised: COVID RSV and Shingrix No. 2 RTC 4 months

## 2022-08-06 ENCOUNTER — Telehealth: Payer: Self-pay

## 2022-08-06 NOTE — Assessment & Plan Note (Signed)
DM: Currently on metformin, check A1c. Feet exam: No neuropathy, has toe deformities and callouses  HTN: On amlodipine, Cardizem, metoprolol.  Check BMP. High cholesterol: He is on atorvastatin 80 mg, based on last LDL Zetia was added.  Check FLP. Mild anemia: See LOV, iron and ferritin were okay. PAF Saw cardiology 07/31/2022, Rx to continue anticoagulation. Diarrhea: See LOV, resolved. Preventive care: Fall prevention discussed, has a cane, encouraged to use it daily. Vaccines advised: COVID RSV and Shingrix No. 2 RTC 4 months

## 2022-08-06 NOTE — Telephone Encounter (Signed)
Diabetic shoe form completed and faxed back w/ OV note from 08/05/22 to [phone]. Form sent for scanning.

## 2022-08-29 ENCOUNTER — Other Ambulatory Visit: Payer: Self-pay | Admitting: Internal Medicine

## 2022-09-01 ENCOUNTER — Other Ambulatory Visit: Payer: Self-pay | Admitting: Internal Medicine

## 2022-09-01 MED ORDER — ACCU-CHEK GUIDE W/DEVICE KIT: PACK | 0 refills | Status: AC

## 2022-09-01 MED ORDER — ACCU-CHEK SOFTCLIX LANCETS MISC: 12 refills | Status: AC

## 2022-09-01 MED ORDER — ACCU-CHEK GUIDE VI STRP: ORAL_STRIP | 12 refills | Status: DC

## 2022-09-12 ENCOUNTER — Other Ambulatory Visit: Payer: Self-pay | Admitting: Internal Medicine

## 2022-09-30 ENCOUNTER — Ambulatory Visit: Payer: Medicare Other | Admitting: Podiatry

## 2022-09-30 ENCOUNTER — Encounter: Payer: Self-pay | Admitting: Internal Medicine

## 2022-09-30 DIAGNOSIS — M2012 Hallux valgus (acquired), left foot: Secondary | ICD-10-CM | POA: Diagnosis not present

## 2022-09-30 DIAGNOSIS — M21612 Bunion of left foot: Secondary | ICD-10-CM

## 2022-09-30 DIAGNOSIS — L84 Corns and callosities: Secondary | ICD-10-CM | POA: Diagnosis not present

## 2022-09-30 DIAGNOSIS — M79675 Pain in left toe(s): Secondary | ICD-10-CM

## 2022-09-30 DIAGNOSIS — M2042 Other hammer toe(s) (acquired), left foot: Secondary | ICD-10-CM

## 2022-09-30 DIAGNOSIS — M79674 Pain in right toe(s): Secondary | ICD-10-CM | POA: Diagnosis not present

## 2022-09-30 DIAGNOSIS — B351 Tinea unguium: Secondary | ICD-10-CM | POA: Diagnosis not present

## 2022-09-30 DIAGNOSIS — E1142 Type 2 diabetes mellitus with diabetic polyneuropathy: Secondary | ICD-10-CM | POA: Diagnosis not present

## 2022-09-30 DIAGNOSIS — E119 Type 2 diabetes mellitus without complications: Secondary | ICD-10-CM

## 2022-09-30 DIAGNOSIS — M2041 Other hammer toe(s) (acquired), right foot: Secondary | ICD-10-CM

## 2022-09-30 NOTE — Progress Notes (Unsigned)
  Subjective:  Patient ID: Marcus Beasley, male    DOB: 08-Oct-1945,  MRN: [id number]  Chief Complaint  Patient presents with   Nail Problem    4 month follow up for painful toenails/pick up diabetic shoes    77 y.o. male returns with the above complaint. History confirmed with patient.  Overall doing well no new issues calluses and nails become thickened and painful again. Calluses painful as well.  Reports good blood sugar control.  He was fitted for diabetic shoes that are ready for pickup today  Objective:  Physical Exam: warm, good capillary refill, no trophic changes or ulcerative lesions, normal DP and PT pulses and normal sensory exam. Onychomycosis is noted to all toenails with brown discoloration, thickening the nail plate and subungual debris.  He has a bunion on the left foot and hammertoes that are similar reducible in nature bilateral Left Foot:  L dorsal PIPJ corn fifth toe  right Foot: medial hallux pinch tyloma, submetatarsal 5  Assessment:   1. Pain due to onychomycosis of toenails of both feet   2. Callus of foot   3. Type 2 diabetes mellitus without complication, without long-term current use of insulin        Plan:  Patient was evaluated and treated and all questions answered.  Patient educated on diabetes. Discussed proper diabetic foot care and discussed risks and complications of disease. Educated patient in depth on reasons to return to the office immediately should he/she discover anything concerning or new on the feet. All questions answered. Discussed proper shoes as well.  With his multiple foot deformities and diabetes he likely would benefit and has benefited from diabetic extra-depth shoes with multidensity insoles.  He will be scheduled for casting for these.  Discussed the etiology and treatment options for the condition in detail with the patient. Educated patient on the topical and oral treatment options for mycotic nails. Recommended debridement of  the nails today. Sharp and mechanical debridement performed of all painful and mycotic nails today. Nails debrided in length and thickness using a nail nipper and a mechanical burr to level of comfort. Discussed treatment options including appropriate shoe gear. Follow up as needed for painful nails.  All symptomatic hyperkeratoses were safely debrided with a sterile #15 blade to patient's level of comfort without incident. We discussed preventative and palliative care of these lesions including supportive and accommodative shoegear, padding, prefabricated and custom molded accommodative orthoses, use of a pumice stone and lotions/creams daily.  Silicone toe pads were dispensed for digital corns  Diabetic shoes were assessed for fit and form and function by our shoe fit her today, he was pleased with them and the break-in period was reviewed.  Shoes and 3 pairs of inserts were dispensed.  Return in about 4 months (around 01/30/2023) for at risk diabetic foot care.

## 2022-10-01 DIAGNOSIS — H25813 Combined forms of age-related cataract, bilateral: Secondary | ICD-10-CM | POA: Diagnosis not present

## 2022-10-01 DIAGNOSIS — H35033 Hypertensive retinopathy, bilateral: Secondary | ICD-10-CM | POA: Diagnosis not present

## 2022-10-01 DIAGNOSIS — H40013 Open angle with borderline findings, low risk, bilateral: Secondary | ICD-10-CM | POA: Diagnosis not present

## 2022-10-01 DIAGNOSIS — H353131 Nonexudative age-related macular degeneration, bilateral, early dry stage: Secondary | ICD-10-CM | POA: Diagnosis not present

## 2022-10-01 DIAGNOSIS — H40033 Anatomical narrow angle, bilateral: Secondary | ICD-10-CM | POA: Diagnosis not present

## 2022-10-01 DIAGNOSIS — E119 Type 2 diabetes mellitus without complications: Secondary | ICD-10-CM | POA: Diagnosis not present

## 2022-10-01 LAB — HM DIABETES EYE EXAM

## 2022-10-26 ENCOUNTER — Other Ambulatory Visit: Payer: Self-pay | Admitting: Cardiology

## 2022-11-25 ENCOUNTER — Encounter: Payer: Self-pay | Admitting: Internal Medicine

## 2022-12-03 ENCOUNTER — Encounter: Payer: Self-pay | Admitting: Internal Medicine

## 2022-12-09 ENCOUNTER — Encounter: Payer: Self-pay | Admitting: Internal Medicine

## 2022-12-09 ENCOUNTER — Ambulatory Visit (INDEPENDENT_AMBULATORY_CARE_PROVIDER_SITE_OTHER): Payer: Medicare Other | Admitting: Internal Medicine

## 2022-12-09 VITALS — BP 118/62 | HR 62 | Temp 97.8°F | Resp 16 | Ht 71.0 in | Wt 174.5 lb

## 2022-12-09 DIAGNOSIS — I10 Essential (primary) hypertension: Secondary | ICD-10-CM | POA: Diagnosis not present

## 2022-12-09 DIAGNOSIS — E1159 Type 2 diabetes mellitus with other circulatory complications: Secondary | ICD-10-CM

## 2022-12-09 DIAGNOSIS — E785 Hyperlipidemia, unspecified: Secondary | ICD-10-CM

## 2022-12-09 DIAGNOSIS — Z0000 Encounter for general adult medical examination without abnormal findings: Secondary | ICD-10-CM

## 2022-12-09 DIAGNOSIS — Z7984 Long term (current) use of oral hypoglycemic drugs: Secondary | ICD-10-CM | POA: Diagnosis not present

## 2022-12-09 LAB — CBC WITH DIFFERENTIAL/PLATELET
Basophils Absolute: 0 10*3/uL (ref 0.0–0.1)
Basophils Relative: 0.5 % (ref 0.0–3.0)
Eosinophils Absolute: 0 10*3/uL (ref 0.0–0.7)
Eosinophils Relative: 0.8 % (ref 0.0–5.0)
HCT: 44.9 % (ref 39.0–52.0)
Hemoglobin: 14.2 g/dL (ref 13.0–17.0)
Lymphocytes Relative: 28.3 % (ref 12.0–46.0)
Lymphs Abs: 1.5 10*3/uL (ref 0.7–4.0)
MCHC: 31.7 g/dL (ref 30.0–36.0)
MCV: 87.6 fl (ref 78.0–100.0)
Monocytes Absolute: 0.6 10*3/uL (ref 0.1–1.0)
Monocytes Relative: 10.6 % (ref 3.0–12.0)
Neutro Abs: 3.2 10*3/uL (ref 1.4–7.7)
Neutrophils Relative %: 59.8 % (ref 43.0–77.0)
Platelets: 301 10*3/uL (ref 150.0–400.0)
RBC: 5.13 Mil/uL (ref 4.22–5.81)
RDW: 15 % (ref 11.5–15.5)
WBC: 5.3 10*3/uL (ref 4.0–10.5)

## 2022-12-09 LAB — BASIC METABOLIC PANEL
BUN: 15 mg/dL (ref 6–23)
CO2: 26 mEq/L (ref 19–32)
Calcium: 9.7 mg/dL (ref 8.4–10.5)
Chloride: 104 mEq/L (ref 96–112)
Creatinine, Ser: 1.22 mg/dL (ref 0.40–1.50)
GFR: 56.87 mL/min — ABNORMAL LOW (ref >60.00)
Glucose, Bld: 117 mg/dL — ABNORMAL HIGH (ref 70–99)
Potassium: 4.6 mEq/L (ref 3.5–5.1)
Sodium: 139 mEq/L (ref 135–145)

## 2022-12-09 LAB — AST: AST: 24 U/L (ref 0–37)

## 2022-12-09 LAB — ALT: ALT: 27 U/L (ref 0–53)

## 2022-12-09 LAB — HEMOGLOBIN A1C: Hgb A1c MFr Bld: 6.7 % — ABNORMAL HIGH (ref 4.6–6.5)

## 2022-12-09 NOTE — Patient Instructions (Addendum)
Vaccines I recommend: Covid booster RSV vaccine Shingrix (shingles) #2 Flu shot this fall  Check the  blood pressure regularly BP GOAL is between 110/65 and  135/85. If it is consistently higher or lower, let me know    GO TO THE LAB : Get the blood work     GO TO THE FRONT DESK, PLEASE SCHEDULE YOUR APPOINTMENTS Come back for   a checkup in 4 to 6 months       Please read the information about fall prevention  "Health Care Power of attorney" ,  "Living will" (Advance care planning documents)  If you already have a living will or healthcare power of attorney, is recommended you bring the copy to be scanned in your chart.   The document will be available to all the doctors you see in the system.  Advance care planning is a process that supports adults in  understanding and sharing their preferences regarding future medical care.  The patient's preferences are recorded in documents called Advance Directives and the can be modified at any time while the patient is in full mental capacity.   If you don't have one, please consider create one.      More information at: [email or web address]

## 2022-12-09 NOTE — Assessment & Plan Note (Signed)
-   Td:  08-2017 - PNM 23:  2011, 2022;  Prevnar 2014 - zostavax 05-2015 -had shingrex #1, recommend #2. -Vaccines I recommend: RSV, COVID booster, flu shot this fall - CCS: Colonoscopy 06-2014, had polyps, cscope 10/2017, next is due, plans to discuss with GI. - Prostate cancer screening per urology.  -Patient education: Fall prevention, healthcare POA. -Labs: BMP AST ALT CBC A1c

## 2022-12-09 NOTE — Progress Notes (Signed)
Subjective:    Patient ID: Marcus Beasley, male    DOB: May 01, 1946, 77 y.o.   MRN: [id number]  DOS:  12/09/2022 Type of visit - description: cpx  Since the last office visit is doing well. Has no major concerns.  Review of Systems   A 14 point review of systems is negative    Past Medical History:  Diagnosis Date   BPH (benign prostatic hyperplasia)    (-) Bx 2015   Diabetes mellitus without complication (HCC)    Elevated PSA    Prostate Bx in 06/2013 was benign   Glaucoma suspect    HTN (hypertension)    Hyperlipidemia    Macular degeneration, age related     Past Surgical History:  Procedure Laterality Date   ABCESS DRAINAGE     abdomen- 26 day hospitalization 1968   COLONOSCOPY  2016   HERNIA REPAIR  summer '11   umbilical, dr Magnus Ivan    POLYPECTOMY     PROSTATE BIOPSY  06-2013 , 07-2017   (-), (-)   Social History   Socioeconomic History   Marital status: Married    Spouse name: Not on file   Number of children: 3   Years of education: 14   Highest education level: Not on file  Occupational History   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 beer   Drug use: No   Sexual activity: Yes    Partners: Female  Other Topics Concern   Not on file  Social History Narrative   HSG. [employer]    Married - '69. 2 dtrs , 1 son - homicide. 5 grandchildren.     Household: pt, wife, daughter Tempie Hoist) and g-son     Driving limited as off  [phone]   Social Determinants of Health   Financial Resource Strain: Low Risk  (01/08/2022)   Overall Financial Resource Strain (CARDIA)    Difficulty of Paying Living Expenses: Not hard at all  Food Insecurity: No Food Insecurity (04/18/2022)   Hunger Vital Sign    Worried About Running Out of Food in the Last Year: Never  true    Ran Out of Food in the Last Year: Never true  Transportation Needs: No Transportation Needs (04/18/2022)   PRAPARE - Administrator, Civil Service (Medical): No    Lack of Transportation (Non-Medical): No  Physical Activity: Sufficiently Active (01/08/2022)   Exercise Vital Sign    Days of Exercise per Week: 6 days    Minutes of Exercise per Session: 30 min  Stress: No Stress Concern Present (01/08/2022)   [employer] of Occupational Health - Occupational Stress Questionnaire    Feeling of Stress : Not at all  Social Connections: Moderately Integrated (01/08/2022)   Social Connection and Isolation Panel [NHANES]    Frequency of Communication with Friends and Family: More than three times a week    Frequency of Social Gatherings with Friends and Family: More than three times a week    Attends Religious Services: More than 4 times per year    Active Member of [employer] or Organizations: No    Attends Banker Meetings: Never    Marital Status: Married  Catering manager Violence: Not At Risk (01/08/2022)   Humiliation,  Afraid, Rape, and Kick questionnaire    Fear of Current or Ex-Partner: No    Emotionally Abused: No    Physically Abused: No    Sexually Abused: No    Current Outpatient Medications  Medication Instructions   Accu-Chek Softclix Lancets lancets Check blood sugars twice daily   amLODipine (NORVASC) 5 mg, Oral, Daily   atorvastatin (LIPITOR) 80 MG tablet TAKE 1 TABLET BY MOUTH EVERY DAY   Blood Glucose Monitoring Suppl (ACCU-CHEK GUIDE) w/Device KIT Check blood sugars twice daily   diltiazem (CARDIZEM CD) 240 mg, Oral, Daily   Eliquis 5 mg, Oral, 2 times daily   ezetimibe (ZETIA) 10 mg, Oral, Daily   finasteride (PROSCAR) 5 mg, Oral, Daily   glucose blood (ACCU-CHEK GUIDE) test strip Check blood sugars twice daily   meclizine (ANTIVERT) 25 mg, Oral, 2 times daily PRN   metFORMIN (GLUCOPHAGE) 500 mg, Oral, Daily with breakfast    metoprolol tartrate (LOPRESSOR) 50 MG tablet TAKE 1 TABLET BY MOUTH TWICE A DAY   Multiple Vitamin (MULTIVITAMIN WITH MINERALS) TABS tablet 1 tablet, Oral, Daily   Multiple Vitamins-Minerals (PRESERVISION/LUTEIN PO) 1 tablet, Oral, Daily   olopatadine (PATANOL) 0.1 % ophthalmic solution SMARTSIG:In Eye(s)   RESTASIS 0.05 % ophthalmic emulsion 1 drop, 2 times daily       Objective:   Physical Exam BP 118/62   Pulse 62   Temp 97.8 F (36.6 C) (Oral)   Resp 16   Ht 5\' 11"  (1.803 m)   Wt 174 lb 8 oz (79.2 kg)   SpO2 97%   BMI 24.34 kg/m  General: Well developed, NAD, BMI noted Neck: No  thyromegaly  HEENT:  Normocephalic . Face symmetric, atraumatic Lungs:  CTA B Normal respiratory effort, no intercostal retractions, no accessory muscle use. Heart: Seems regular Abdomen:  Not distended, soft, non-tender. No rebound or rigidity.   Lower extremities: no pretibial edema bilaterally  Skin: Exposed areas without rash. Not pale. Not jaundice Neurologic:  alert & oriented X3.  Speech normal, gait consistent with previous stroke, uses a cane.    Psych: Cognition and judgment appear intact.  Cooperative with normal attention span and concentration.  Behavior appropriate. No anxious or depressed appearing.     Assessment     Assessment DM  ---->  DX 07-2015, A1c 8.0; started metformin Neuropathy, mild.  DX 08-2017 (in sensitive type) HTN   Hyperlipidemia UROLOGY: BPH, increased PSA. 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  Hep C serologies negative, hep B surface Ab+, hep B core antibody negative (consistent with vaccination)  PLAN Here for CPX - Td:  08-2017 - PNM 23:  2011, 2022;  Prevnar 2014 - zostavax 05-2015 -had shingrex #1, recommend #2. -Vaccines I recommend: RSV, COVID booster, flu shot this fall - CCS: Colonoscopy 06-2014,  had polyps, cscope 10/2017, next is due, plans to discuss with GI. - Prostate cancer screening per urology.  -Patient education: Fall prevention, healthcare POA. -Labs: BMP AST ALT CBC A1c DM: On metformin, ambulatory CBGs normal, could not tell me readings.  Check labs HTN: BP today is very good, on amlodipine, Cardizem, metoprolol.  Checking labs Dyslipidemia: Last FLP very good, continue atorvastatin and Zetia. Paroxysmal A-fib: Rate controlled, anticoagulated.  Seems to be doing well RTC 4 to 6 months

## 2022-12-09 NOTE — Assessment & Plan Note (Addendum)
Here for CPX DM: On metformin, ambulatory CBGs normal, could not tell me readings.  Check labs HTN: BP today is very good, on amlodipine, Cardizem, metoprolol.  Checking labs Dyslipidemia: Last FLP very good, continue atorvastatin and Zetia. Paroxysmal A-fib: Rate controlled, anticoagulated.  Seems to be doing well RTC 4 to 6 months

## 2022-12-21 ENCOUNTER — Other Ambulatory Visit: Payer: Self-pay | Admitting: Internal Medicine

## 2023-01-20 ENCOUNTER — Other Ambulatory Visit: Payer: Self-pay | Admitting: Cardiology

## 2023-01-20 ENCOUNTER — Telehealth: Payer: Self-pay | Admitting: Podiatry

## 2023-01-20 ENCOUNTER — Other Ambulatory Visit: Payer: Self-pay | Admitting: Internal Medicine

## 2023-01-20 DIAGNOSIS — I480 Paroxysmal atrial fibrillation: Secondary | ICD-10-CM

## 2023-01-20 NOTE — Telephone Encounter (Signed)
Eliquis 5mg  refill request received. Patient is 77 years old, weight-79.2kg, Crea-1.22 on 12/09/22, Diagnosis-Afib, and last seen by Dr. Swaziland on 07/31/22. Dose is appropriate based on dosing criteria. Will send in refill to requested pharmacy.

## 2023-01-20 NOTE — Telephone Encounter (Signed)
Pt called and rescheduled his appt for next week.  He then stated he got a bill for the diabetic shoes and inserts and is diabetic and was not sure why they did not get covered. Please call pt with information.

## 2023-01-27 ENCOUNTER — Ambulatory Visit: Payer: Medicare Other | Admitting: Podiatry

## 2023-02-12 ENCOUNTER — Other Ambulatory Visit: Payer: Self-pay | Admitting: Internal Medicine

## 2023-04-16 ENCOUNTER — Ambulatory Visit: Payer: Medicare Other | Admitting: Internal Medicine

## 2023-04-16 ENCOUNTER — Encounter: Payer: Self-pay | Admitting: Internal Medicine

## 2023-04-16 VITALS — BP 112/70 | HR 68 | Ht 69.5 in | Wt 181.5 lb

## 2023-04-16 DIAGNOSIS — Z860100 Personal history of colon polyps, unspecified: Secondary | ICD-10-CM | POA: Diagnosis not present

## 2023-04-16 DIAGNOSIS — Z7901 Long term (current) use of anticoagulants: Secondary | ICD-10-CM | POA: Diagnosis not present

## 2023-04-16 NOTE — Patient Instructions (Signed)
If you decide you want a colonoscopy, please call our office at [phone].  _______________________________________________________  If your blood pressure at your visit was 140/90 or greater, please contact your primary care physician to follow up on this.  _______________________________________________________  If you are age 78 or older, your body mass index should be between 23-30. Your Body mass index is 26.42 kg/m. If this is out of the aforementioned range listed, please consider follow up with your Primary Care Provider.  If you are age 7 or younger, your body mass index should be between 19-25. Your Body mass index is 26.42 kg/m. If this is out of the aformentioned range listed, please consider follow up with your Primary Care Provider.   ________________________________________________________  The [facility] providers would like to encourage you to use [facility] to communicate with providers for non-urgent requests or questions.  Due to long hold times on the telephone, sending your provider a message by [facility] may be a faster and more efficient way to get a response.  Please allow 48 business hours for a response.  Please remember that this is for non-urgent requests.  _______________________________________________________

## 2023-04-16 NOTE — Progress Notes (Signed)
Patient ID: Marcus Beasley, male   DOB: 1945/10/03, 77 y.o.   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  He is here today with his daughter.  He reports he has been doing well.  He remains active.  He is not having any GI complaint.  No abdominal pain.  No change in bowel habit.  No blood in stool or melena.  No upper GI or hepatobiliary complaint.  No recent chest pain, shortness of breath or lower extremity edema.  He follows with Dr. Drue Novel with primary care and Dr. Swaziland with cardiology.  In 2016 he had 6 adenomas removed on colonoscopy the largest being 1 cm In 2019 he had 4 polyps removed, 1 of which was a sessile serrated polyp.  Past Medical History:  Diagnosis Date   Atrial fibrillation (HCC)    BPH (benign prostatic hyperplasia)    (-) Bx 2015   Colon polyp    Diabetes mellitus without complication (HCC)    Elevated PSA    Prostate Bx in 06/2013 was benign   Glaucoma suspect    HTN (hypertension)    Hyperlipidemia    Macular degeneration, age related    Peptic ulcer    Stroke [facility]     Past Surgical History:  Procedure Laterality Date   ABCESS DRAINAGE     abdomen- 26 day hospitalization 1968   COLONOSCOPY  2016   HERNIA REPAIR  summer '11   umbilical, dr Magnus Ivan    POLYPECTOMY     PROSTATE BIOPSY  06-2013 , 07-2017   (-), (-)    Outpatient Medications Prior to Visit  Medication Sig Dispense Refill   Accu-Chek Softclix Lancets lancets Check blood sugars twice daily 200 each 12   amLODipine (NORVASC) 5 MG tablet Take 1 tablet (5 mg total) by mouth daily. 90 tablet 1   atorvastatin (LIPITOR) 80 MG tablet TAKE 1 TABLET BY MOUTH EVERY DAY 90 tablet 2   Blood Glucose Monitoring Suppl (ACCU-CHEK GUIDE) w/Device KIT Check  blood sugars twice daily 1 kit 0   diltiazem (CARDIZEM CD) 240 MG 24 hr capsule Take 1 capsule (240 mg total) by mouth daily. 90 capsule 1   ELIQUIS 5 MG TABS tablet TAKE 1 TABLET BY MOUTH TWICE A DAY 180 tablet 1   ezetimibe (ZETIA) 10 MG tablet Take 1 tablet (10 mg total) by mouth daily. 90 tablet 1   finasteride (PROSCAR) 5 MG tablet Take 5 mg by mouth daily.     glucose blood (ACCU-CHEK GUIDE) test strip Check blood sugars twice daily 200 each 12   meclizine (ANTIVERT) 25 MG tablet Take 1 tablet (25 mg total) by mouth 2 (two) times daily as needed for dizziness. 60 tablet 1   metFORMIN (GLUCOPHAGE) 500 MG tablet Take 1 tablet (500 mg total) by mouth daily with breakfast. 90 tablet 1   metoprolol tartrate (LOPRESSOR) 50 MG tablet TAKE 1 TABLET BY MOUTH TWICE A DAY 180 tablet 3   Multiple Vitamin (MULTIVITAMIN WITH MINERALS) TABS tablet Take 1 tablet by mouth daily.      Multiple Vitamins-Minerals (PRESERVISION/LUTEIN PO) Take 1 tablet by mouth daily.      olopatadine (PATANOL) 0.1 % ophthalmic solution SMARTSIG:In Eye(s)     RESTASIS 0.05 % ophthalmic emulsion 1 drop 2 (two)  times daily.     No facility-administered medications prior to visit.    No Known Allergies  Family History  Problem Relation Age of Onset   Leukemia Mother    Heart attack Father        MI age 32   Heart disease Sister        age 42   Cancer - Other Sister        type   Diabetes Sister    Diabetes Sister    Heart disease Brother        age 84   Kidney disease Brother        HD   Diabetes Maternal Grandmother    Diabetes Daughter    Prostate cancer Neg Hx    Colon cancer Neg Hx    Esophageal cancer Neg Hx    Rectal cancer Neg Hx    Stomach cancer Neg Hx     Social History   Tobacco Use   Smoking status: Never   Smokeless tobacco: Never  Vaping Use   Vaping status: Never Used  Substance Use Topics   Alcohol use: Not Currently   Drug use: No    ROS: As per history of present illness,  otherwise negative  BP 112/70 (BP Location: Left Arm, Patient Position: Sitting, Cuff Size: Normal)   Pulse 68   Ht 5' 9.5" (1.765 m) Comment: height measured without shoes  Wt 181 lb 8 oz (82.3 kg)   BMI 26.42 kg/m  Gen: awake, alert, NAD HEENT: anicteric  CV: RRR, no mrg Pulm: CTA b/l Abd: soft, NT/ND, +BS throughout Ext: no c/c/e Neuro: nonfocal  RELEVANT LABS AND IMAGING: CBC    Component Value Date/Time   WBC 5.3 12/09/2022 0823   RBC 5.13 12/09/2022 0823   HGB 14.2 12/09/2022 0823   HGB 14.4 01/20/2019 0922   HCT 44.9 12/09/2022 0823   HCT 44.3 01/20/2019 0922   PLT 301.0 12/09/2022 0823   PLT 303 01/20/2019 0922   MCV 87.6 12/09/2022 0823   MCV 88 01/20/2019 0922   MCH 28.5 09/11/2020 2022   MCHC 31.7 12/09/2022 0823   RDW 15.0 12/09/2022 0823   RDW 12.5 01/20/2019 0922   LYMPHSABS 1.5 12/09/2022 0823   LYMPHSABS 1.7 01/20/2019 0922   MONOABS 0.6 12/09/2022 0823   EOSABS 0.0 12/09/2022 0823   EOSABS 0.1 01/20/2019 0922   BASOSABS 0.0 12/09/2022 0823   BASOSABS 0.0 01/20/2019 0922    CMP     Component Value Date/Time   NA 139 12/09/2022 0823   NA 140 01/20/2019 0922   K 4.6 12/09/2022 0823   CL 104 12/09/2022 0823   CO2 26 12/09/2022 0823   GLUCOSE 117 (H) 12/09/2022 0823   BUN 15 12/09/2022 0823   BUN 15 01/20/2019 0922   CREATININE 1.22 12/09/2022 0823   CREATININE 0.90 03/08/2020 1024   CALCIUM 9.7 12/09/2022 0823   PROT 6.9 01/27/2022 0955   PROT 7.0 01/20/2019 0922   ALBUMIN 4.5 01/27/2022 0955   ALBUMIN 4.6 01/20/2019 0922   AST 24 12/09/2022 0823   ALT 27 12/09/2022 0823   ALKPHOS 91 01/27/2022 0955   BILITOT 0.5 01/27/2022 0955   BILITOT 0.4 01/20/2019 0922   GFRNONAA >60 09/11/2020 2022   GFRAA 97 01/20/2019 0922    ASSESSMENT/PLAN: 77 year old male known to me with a history 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  History of multiple adenomatous colon polyps --surveillance colonoscopy would be indicated at this time.  However at age 62 on chronic anticoagulation we had a long and thorough discussion regarding the risk versus benefits of repeat surveillance colonoscopy.  I do think he is medically fit enough for repeat colonoscopy.  We would need to pause Eliquis 24 to 48 hours for the colonoscopy.  He wishes to think about this more and discuss further with his wife and daughter.  He will also discuss this with Dr. Chrys Racer he is seen on 06/10/2023 and primary care. -- Repeat colonoscopy in the [facility] can be scheduled after receiving permission from cardiology to hold Eliquis if the patient chooses to proceed after discussion with primary care and family      Cc:Paz, Nolon Rod, Md [address] [place],  [place] [address]

## 2023-04-18 ENCOUNTER — Other Ambulatory Visit: Payer: Self-pay | Admitting: Internal Medicine

## 2023-05-05 ENCOUNTER — Encounter: Payer: Self-pay | Admitting: Podiatry

## 2023-05-05 ENCOUNTER — Ambulatory Visit: Payer: Medicare Other | Admitting: Podiatry

## 2023-05-05 DIAGNOSIS — B351 Tinea unguium: Secondary | ICD-10-CM

## 2023-05-05 DIAGNOSIS — E119 Type 2 diabetes mellitus without complications: Secondary | ICD-10-CM

## 2023-05-05 DIAGNOSIS — M79674 Pain in right toe(s): Secondary | ICD-10-CM | POA: Diagnosis not present

## 2023-05-05 DIAGNOSIS — L84 Corns and callosities: Secondary | ICD-10-CM | POA: Diagnosis not present

## 2023-05-05 DIAGNOSIS — M79675 Pain in left toe(s): Secondary | ICD-10-CM

## 2023-05-05 NOTE — Progress Notes (Signed)
  Subjective:  Patient ID: Marcus Beasley, male    DOB: [birth year],  MRN: [id number]  Chief Complaint  Patient presents with   Diabetes    "I'm here for a check up and he cuts my calluses and nails."    77 y.o. male returns with the above complaint. History confirmed with patient.  Overall doing well no new issues calluses and nails become thickened and painful again. Calluses painful as well.  Reports good blood sugar control.    Objective:  Physical Exam: warm, good capillary refill, no trophic changes or ulcerative lesions, normal DP and PT pulses and normal sensory exam. Onychomycosis is noted to all toenails with brown discoloration, thickening the nail plate and subungual debris.  He has a bunion on the left foot and hammertoes that are similar reducible in nature bilateral Left Foot:  L dorsal PIPJ corn fifth toe  right Foot: medial hallux pinch tyloma, submetatarsal 5  Assessment:   1. Callus of foot   2. Type 2 diabetes mellitus without complication, without long-term current use of insulin (HCC)   3. Pain due to onychomycosis of toenails of both feet        Plan:  Patient was evaluated and treated and all questions answered.   Discussed the etiology and treatment options for the condition in detail with the patient. Educated patient on the topical and oral treatment options for mycotic nails. Recommended debridement of the nails today. Sharp and mechanical debridement performed of all painful and mycotic nails today. Nails debrided in length and thickness using a nail nipper and a mechanical burr to level of comfort. Discussed treatment options including appropriate shoe gear. Follow up as needed for painful nails.  All symptomatic hyperkeratoses were safely debrided with a sterile #15 blade to patient's level of comfort without incident. We discussed preventative and palliative care of these lesions including supportive and accommodative shoegear, padding, prefabricated  and custom molded accommodative orthoses, use of a pumice stone and lotions/creams daily.  Silicone toe pads were dispensed for digital corns    Return in about 4 months (around 09/02/2023) for at risk diabetic foot care.

## 2023-06-10 ENCOUNTER — Ambulatory Visit: Payer: Medicare Other | Admitting: Internal Medicine

## 2023-06-10 ENCOUNTER — Encounter: Payer: Self-pay | Admitting: Internal Medicine

## 2023-06-10 VITALS — BP 124/68 | HR 77 | Temp 98.3°F | Resp 16 | Ht 69.5 in | Wt 185.2 lb

## 2023-06-10 DIAGNOSIS — Z23 Encounter for immunization: Secondary | ICD-10-CM | POA: Diagnosis not present

## 2023-06-10 DIAGNOSIS — E1159 Type 2 diabetes mellitus with other circulatory complications: Secondary | ICD-10-CM

## 2023-06-10 DIAGNOSIS — I10 Essential (primary) hypertension: Secondary | ICD-10-CM | POA: Diagnosis not present

## 2023-06-10 DIAGNOSIS — Z7984 Long term (current) use of oral hypoglycemic drugs: Secondary | ICD-10-CM

## 2023-06-10 DIAGNOSIS — E785 Hyperlipidemia, unspecified: Secondary | ICD-10-CM | POA: Diagnosis not present

## 2023-06-10 LAB — BASIC METABOLIC PANEL
BUN: 16 mg/dL (ref 6–23)
CO2: 27 meq/L (ref 19–32)
Calcium: 9.2 mg/dL (ref 8.4–10.5)
Chloride: 107 meq/L (ref 96–112)
Creatinine, Ser: 1.06 mg/dL (ref 0.40–1.50)
GFR: 67.08 mL/min (ref >60.00)
Glucose, Bld: 111 mg/dL — ABNORMAL HIGH (ref 70–99)
Potassium: 4.3 meq/L (ref 3.5–5.1)
Sodium: 142 meq/L (ref 135–145)

## 2023-06-10 LAB — CBC WITH DIFFERENTIAL/PLATELET
Basophils Absolute: 0.1 10*3/uL (ref 0.0–0.1)
Basophils Relative: 1.1 % (ref 0.0–3.0)
Eosinophils Absolute: 0.1 10*3/uL (ref 0.0–0.7)
Eosinophils Relative: 1.3 % (ref 0.0–5.0)
HCT: 41 % (ref 39.0–52.0)
Hemoglobin: 13.2 g/dL (ref 13.0–17.0)
Lymphocytes Relative: 25 % (ref 12.0–46.0)
Lymphs Abs: 1.4 10*3/uL (ref 0.7–4.0)
MCHC: 32.2 g/dL (ref 30.0–36.0)
MCV: 88.4 fL (ref 78.0–100.0)
Monocytes Absolute: 0.6 10*3/uL (ref 0.1–1.0)
Monocytes Relative: 10.2 % (ref 3.0–12.0)
Neutro Abs: 3.5 10*3/uL (ref 1.4–7.7)
Neutrophils Relative %: 62.4 % (ref 43.0–77.0)
Platelets: 279 10*3/uL (ref 150.0–400.0)
RBC: 4.64 Mil/uL (ref 4.22–5.81)
RDW: 14.6 % (ref 11.5–15.5)
WBC: 5.7 10*3/uL (ref 4.0–10.5)

## 2023-06-10 LAB — MICROALBUMIN / CREATININE URINE RATIO
Creatinine,U: 180.6 mg/dL
Microalb Creat Ratio: 0.8 mg/g (ref 0.0–30.0)
Microalb, Ur: 1.4 mg/dL (ref 0.0–1.9)

## 2023-06-10 LAB — HEMOGLOBIN A1C: Hgb A1c MFr Bld: 7 % — ABNORMAL HIGH (ref 4.6–6.5)

## 2023-06-10 NOTE — Patient Instructions (Addendum)
Vaccines I recommend: Covid booster   Check the  blood pressure regularly Blood pressure goal:  between 110/65 and  135/85. If it is consistently higher or lower, let me know     GO TO THE LAB : Get the blood work     Next visit with me in 5 to 6 months ; please schedule it at the front desk

## 2023-06-10 NOTE — Progress Notes (Unsigned)
Subjective:    Patient ID: Marcus Beasley, male    DOB: [birth year], 77 y.o.   MRN: [id number]  DOS:  06/10/2023 Type of visit - description: f/u  Feels well. Chronic medical problems addressed. Denies chest pain or difficulty breathing.  No palpitations. Very seldom has nausea.  No vomiting, no diarrhea, no blood in the stools. No heartburn.  Review of Systems See above   Past Medical History:  Diagnosis Date   Atrial fibrillation (HCC)    BPH (benign prostatic hyperplasia)    (-) Bx 2015   Colon polyp    Diabetes mellitus without complication (HCC)    Elevated PSA    Prostate Bx in 06/2013 was benign   Glaucoma suspect    HTN (hypertension)    Hyperlipidemia    Macular degeneration, age related    Peptic ulcer    Stroke [facility])     Past Surgical History:  Procedure Laterality Date   ABCESS DRAINAGE     abdomen- 26 day hospitalization 1968   COLONOSCOPY  2016   HERNIA REPAIR  summer '11   umbilical, dr Magnus Ivan    POLYPECTOMY     PROSTATE BIOPSY  06-2013 , 07-2017   (-), (-)    Current Outpatient Medications  Medication Instructions   Accu-Chek Softclix Lancets lancets Check blood sugars twice daily   amLODipine (NORVASC) 5 mg, Oral, Daily   atorvastatin (LIPITOR) 80 MG tablet TAKE 1 TABLET BY MOUTH EVERY DAY   Blood Glucose Monitoring Suppl (ACCU-CHEK GUIDE) w/Device KIT Check blood sugars twice daily   diltiazem (CARDIZEM CD) 240 mg, Oral, Daily   Eliquis 5 mg, Oral, 2 times daily   ezetimibe (ZETIA) 10 mg, Oral, Daily   finasteride (PROSCAR) 5 mg, Daily   glucose blood (ACCU-CHEK GUIDE) test strip Check blood sugars twice daily   meclizine (ANTIVERT) 25 mg, Oral, 2 times daily PRN   metFORMIN (GLUCOPHAGE) 500 mg, Oral, Daily with breakfast   metoprolol tartrate (LOPRESSOR) 50 MG tablet TAKE 1 TABLET BY MOUTH TWICE A DAY   Multiple Vitamin (MULTIVITAMIN WITH MINERALS) TABS tablet 1 tablet, Daily   Multiple Vitamins-Minerals (PRESERVISION/LUTEIN PO) 1  tablet, Daily   olopatadine (PATANOL) 0.1 % ophthalmic solution SMARTSIG:In Eye(s)   RESTASIS 0.05 % ophthalmic emulsion 1 drop, 2 times daily       Objective:   Physical Exam BP 124/68   Pulse 77   Temp 98.3 F (36.8 C) (Oral)   Resp 16   Ht 5' 9.5" (1.765 m)   Wt 185 lb 4 oz (84 kg)   SpO2 98%   BMI 26.96 kg/m  General:   Well developed, NAD, BMI noted. HEENT:  Normocephalic . Face symmetric, atraumatic Lungs:  CTA B Normal respiratory effort, no intercostal retractions, no accessory muscle use. Heart: RRR,  no murmur.  DM foot exam: No edema, good pedal pulses, pinprick examination is slightly decreased distally more so on the right side. Skin: Not pale. Not jaundice Neurologic:  alert & oriented X3.  Speech normal, gait appropriate for age and unassisted Psych--  Cognition and judgment appear intact.  Cooperative with normal attention span and concentration.  Behavior appropriate. No anxious or depressed appearing.      Assessment    Assessment DM  ---->  DX 07-2015, A1c 8.0; started metformin Neuropathy, mild.  DX 08-2017 (in sensitive type) HTN   Hyperlipidemia UROLOGY: BPH, increased PSA. 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  Hep C serologies negative, hep B surface Ab+, hep B core antibody negative (consistent with vaccination)  PLAN DM: Controlled per last A1c, continue metformin, check A1c. Neuropathy: Mild, feet care discussed. HTN: Ambulatory BPs well within normal per patient, very rarely goes higher than 140.  Continue amlodipine, Cardizem, metoprolol.  Check BMP and CBC. Hyperlipidemia: Excellent control with Zetia and atorvastatin Preventive care We discussed colon cancer screening, pros-cons-risk discussed with patient.  Personal preference is back factoring the decision, he is inclined to proceed with a  colonoscopy to be sure.  Plan: Will discuss with cardiology risk from their standpoint and notify GI if he decides to proceed.  Aware that he needs to notify us if he has any GI symptoms. Flu shot today.  Recommend a COVID booster. RTC 5 to 6 months    6-18 Here for CPX - Td:  08-2017 - PNM 23:  2011, 2022;  Prevnar 2014 - zostavax 05-2015 -had shingrex #1, recommend #2. -Vaccines I recommend: RSV, COVID booster, flu shot this fall - CCS: Colonoscopy 06-2014, had polyps, cscope 10/2017, next is due, plans to discuss with GI. - Prostate cancer screening per urology.  -Patient education: Fall prevention, healthcare POA. -Labs: BMP AST ALT CBC A1c DM: On metformin, ambulatory CBGs normal, could not tell me readings.  Check labs HTN: BP today is very good, on amlodipine, Cardizem, metoprolol.  Checking labs Dyslipidemia: Last FLP very good, continue atorvastatin and Zetia. Paroxysmal A-fib: Rate controlled, anticoagulated.  Seems to be doing well RTC 4 to 6 months

## 2023-06-11 NOTE — Assessment & Plan Note (Signed)
DM: Controlled per last A1c, continue metformin, check A1c. Neuropathy: Mild, feet care discussed. HTN: Ambulatory BPs well within normal per patient, SBP rarely goes > 140.  Continue amlodipine, Cardizem, metoprolol.  Check BMP and CBC. Hyperlipidemia: Excellent control with Zetia and atorvastatin Preventive care Colon cancer screening, pros-cons-risk discussed;   he is inclined to proceed with a colonoscopy .  Plan: pt will discuss with cardiology risk from their standpoint and notify GI if he decides to proceed.   Flu shot today.  Recommend a COVID booster. RTC 5 to 6 months

## 2023-07-12 ENCOUNTER — Other Ambulatory Visit: Payer: Self-pay | Admitting: Internal Medicine

## 2023-07-17 ENCOUNTER — Other Ambulatory Visit: Payer: Self-pay | Admitting: Internal Medicine

## 2023-07-17 ENCOUNTER — Other Ambulatory Visit: Payer: Self-pay | Admitting: Cardiology

## 2023-07-23 NOTE — Progress Notes (Signed)
Cardiology Office Note    Date:  07/27/2023   ID:  Marcus Beasley 07-20-1945, MRN [id number]  PCP:  Marcus Plump, MD  Cardiologist:  Dr. [place]  Chief Complaint  Patient presents with   Atrial Fibrillation    History of Present Illness:  Marcus Beasley is a 78 y.o. male seen for follow up Afib, HTN and prior hemorrhagic CVA. He has a  past medical history of hypertension, DM 2 and hyperlipidemia who presented on 03/12/2018 was facial droop, slurred speech and left leg weakness.  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  This was attributed to uncontrolled high blood pressure.  During the admission, patient developed paroxysmal atrial fibrillation and was placed on 180 mg daily of diltiazem. [place] Vasc score of 5. He was not placed on systemic anticoagulation initially due to the brain bleed. Later CT showed resolution of hemorrhage and that it would be safe to resume anticoagulation.   On follow up today he is doing very well. No chest pain,dyspnea, palpitations or dizziness. Completely unaware of Afib.    Past Medical History:  Diagnosis Date   Atrial fibrillation (HCC)    BPH (benign prostatic hyperplasia)    (-) Bx 2015   Colon polyp    Diabetes mellitus without complication (HCC)    Elevated PSA    Prostate Bx in 06/2013 was benign   Glaucoma suspect    HTN (hypertension)    Hyperlipidemia    Macular degeneration, age related    Peptic ulcer    Stroke [facility])     Past Surgical History:  Procedure Laterality Date   ABCESS DRAINAGE     abdomen- 26 day hospitalization 1968   COLONOSCOPY  2016   HERNIA REPAIR  summer '11   umbilical, dr Magnus Ivan    POLYPECTOMY     PROSTATE BIOPSY  06-2013 , 07-2017   (-), (-)    Current Medications: Outpatient Medications Prior to Visit  Medication Sig Dispense Refill   Accu-Chek Softclix Lancets lancets Check blood sugars twice daily 200 each 12   amLODipine (NORVASC) 5 MG tablet Take 1 tablet (5 mg total)  by mouth daily. 90 tablet 1   atorvastatin (LIPITOR) 80 MG tablet TAKE 1 TABLET BY MOUTH EVERY DAY 90 tablet 0   Blood Glucose Monitoring Suppl (ACCU-CHEK GUIDE) w/Device KIT Check blood sugars twice daily 1 kit 0   diltiazem (CARDIZEM CD) 240 MG 24 hr capsule Take 1 capsule (240 mg total) by mouth daily. 90 capsule 1   ELIQUIS 5 MG TABS tablet TAKE 1 TABLET BY MOUTH TWICE A DAY 180 tablet 1   ezetimibe (ZETIA) 10 MG tablet Take 1 tablet (10 mg total) by mouth daily. 90 tablet 1   finasteride (PROSCAR) 5 MG tablet Take 5 mg by mouth daily.     glucose blood (ACCU-CHEK GUIDE) test strip Check blood sugars twice daily 200 each 12   meclizine (ANTIVERT) 25 MG tablet Take 1 tablet (25 mg total) by mouth 2 (two) times daily as needed for dizziness. 60 tablet 1   metFORMIN (GLUCOPHAGE) 500 MG tablet Take 1 tablet (500 mg total) by mouth daily with breakfast. 90 tablet 1   metoprolol tartrate (LOPRESSOR) 50 MG tablet TAKE 1 TABLET BY MOUTH TWICE A DAY 180 tablet 0   Multiple Vitamin (MULTIVITAMIN WITH MINERALS) TABS tablet Take 1 tablet by mouth daily.      Multiple Vitamins-Minerals (PRESERVISION/LUTEIN PO) Take 1 tablet by mouth daily.  olopatadine (PATANOL) 0.1 % ophthalmic solution SMARTSIG:In Eye(s)     RESTASIS 0.05 % ophthalmic emulsion 1 drop 2 (two) times daily.     No facility-administered medications prior to visit.     Allergies:   Patient has no known allergies.   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 [employer]    Married - '69. 2 dtrs , 1 son - homicide. 5 grandchildren.     Household: pt, wife, daughter Tempie Hoist) and g-son     Driving limited as off  [phone]   Social Drivers of Health   Financial Resource Strain: Low Risk  (01/08/2022)   Overall Financial Resource Strain (CARDIA)    Difficulty of Paying Living Expenses: Not hard at all  Food Insecurity: No Food Insecurity (04/18/2022)   Hunger Vital Sign    Worried About Running Out of Food in the Last Year: Never true    Ran Out of Food in the Last Year: Never true  Transportation Needs: No Transportation Needs (04/18/2022)   PRAPARE - Administrator, Civil Service (Medical): No    Lack of Transportation (Non-Medical): No  Physical Activity: Sufficiently Active (01/08/2022)   Exercise Vital Sign    Days of Exercise per Week: 6 days    Minutes of Exercise per Session: 30 min  Stress: No Stress Concern Present (01/08/2022)   [employer] of Occupational Health - Occupational Stress Questionnaire    Feeling of Stress : Not at all  Social Connections: Moderately Integrated (01/08/2022)   Social Connection and Isolation Panel [NHANES]    Frequency of Communication with Friends and Family: More than three times a week    Frequency of Social Gatherings with Friends and Family: More than three times a week    Attends Religious Services: More than 4 times per year    Active Member of [employer] or Organizations: No    Attends Engineer, structural: Never    Marital Status: Married     Family History:  The patient's family history includes Cancer - Other in his sister; Diabetes in his daughter, maternal grandmother, sister, and sister; Heart attack in his father; Heart disease in his brother and sister; Kidney disease in his brother; Leukemia in his mother.   ROS:   Please see the history of present illness.    ROS All other systems reviewed and are  negative.   PHYSICAL EXAM:   VS:  BP 122/70   Pulse 85   Ht 5\' 11"  (1.803 m)   Wt 183 lb (83 kg)   SpO2 97%   BMI 25.52 kg/m    GENERAL:  Well appearing BM in NAD HEENT: normal NECK:  No jugular venous distention, carotid upstroke brisk and symmetric, no bruits, no thyromegaly or adenopathy LUNGS:  Clear to auscultation bilaterally CHEST:  Unremarkable HEART:  IRRR,  PMI not displaced or sustained,S1 and S2 within normal limits, no S3, no S4: no clicks, no rubs, no murmurs ABD:  Soft, nontender. BS +, no masses or bruits. No hepatomegaly, no splenomegaly EXT:  2 + pulses throughout, no edema, SKIN:  Warm and dry.  No rashes NEURO:  Alert and oriented x 3. Cranial nerves II through XII intact. Some left sided weakness persists. PSYCH:  Cognitively intact    Wt Readings from Last 3 Encounters:  07/27/23 183 lb (83 kg)  06/10/23 185 lb 4 oz (84 kg)  04/16/23 181 lb 8 oz (82.3 kg)      Studies/Labs Reviewed:   EKG Interpretation Date/Time:  Monday July 27 2023 08:51:06 EST Ventricular Rate:  85 PR Interval:    QRS Duration:  82 QT Interval:  348 QTC Calculation: 414 R Axis:   38  Text Interpretation: Atrial fibrillation When compared with ECG of  Jul 31, 2022 Atrial fibrillation has replaced Sinus rhythm Confirmed by [place], Ezekiah Massie [phone]) on 07/27/2023 8:53:55 AM    Recent Labs: 12/09/2022: ALT 27 06/10/2023: BUN 16; Creatinine, Ser 1.06; Hemoglobin 13.2; Platelets 279.0; Potassium 4.3; Sodium 142   Lipid Panel    Component Value Date/Time   CHOL 112 08/05/2022 0904   CHOL 141 01/20/2019 0922   TRIG 52.0 08/05/2022 0904   HDL 48.80 08/05/2022 0904   HDL 44 01/20/2019 0922   CHOLHDL 2 08/05/2022 0904   VLDL 10.4 08/05/2022 0904   LDLCALC 53 08/05/2022 0904   LDLCALC 85 01/20/2019 0922    Additional studies/ records that were reviewed today include:   Echo 03/13/2018 LV EF: 65% -   70% Study Conclusions   - Left ventricle: The cavity size was normal. Wall  thickness was   increased in a pattern of mild LVH. Systolic function was   vigorous. The estimated ejection fraction was in the range of 65%   to 70%. Wall motion was normal; there were no regional wall   motion abnormalities. - Aortic valve: There was trivial regurgitation.    ASSESSMENT:    1. PAF (paroxysmal atrial fibrillation) (HCC)   2. Essential hypertension   3. Hyperlipidemia, unspecified hyperlipidemia type   4. H/O: CVA (cerebrovascular accident)       PLAN:  In order of problems listed above:  1. PAF: he is completely asymptomatic. Continue diltiazem and metoprolol for rate control. Continue Eliquis.    2. History of CVA: s/p hemorrhagic stroke involving right thalamus. Likely hypertensive but he did have Afib. Continue Eliquis  3. Hypertension: BP is in excellent control. Continue amlodipine, diltiazem, metoprolol.   4. Hyperlipidemia: last LDL 53. Continue lipitor and Zetia.   5. DM2: Managed by primary care provider.  On metformin. A1c 7%.   I will see in one year   Medication Adjustments/Labs and Tests Ordered: Current medicines are reviewed at length with the patient today.  Concerns regarding medicines are outlined above.  Medication changes, Labs and Tests ordered today are listed in the Patient Instructions below. There are no Patient Instructions on file for this visit.   Signed, Raya Mckinstry [place], MD  07/27/2023 8:59 AM    [facility] [address] [place], [place], [place]  [address] Phone: [phone]; Fax: [phone]

## 2023-07-27 ENCOUNTER — Encounter: Payer: Self-pay | Admitting: Cardiology

## 2023-07-27 ENCOUNTER — Ambulatory Visit: Payer: Medicare Other | Attending: Cardiology | Admitting: Cardiology

## 2023-07-27 VITALS — BP 122/70 | HR 85 | Ht 71.0 in | Wt 183.0 lb

## 2023-07-27 DIAGNOSIS — I10 Essential (primary) hypertension: Secondary | ICD-10-CM | POA: Diagnosis not present

## 2023-07-27 DIAGNOSIS — I480 Paroxysmal atrial fibrillation: Secondary | ICD-10-CM | POA: Diagnosis not present

## 2023-07-27 DIAGNOSIS — Z8673 Personal history of transient ischemic attack (TIA), and cerebral infarction without residual deficits: Secondary | ICD-10-CM

## 2023-07-27 DIAGNOSIS — E785 Hyperlipidemia, unspecified: Secondary | ICD-10-CM

## 2023-07-27 MED ORDER — APIXABAN 5 MG PO TABS: 5.0000 mg | ORAL_TABLET | Freq: Two times a day (BID) | ORAL | Status: DC

## 2023-07-27 NOTE — Patient Instructions (Signed)
Medication Instructions:  Continue same medications *If you need a refill on your cardiac medications before your next appointment, please call your pharmacy*   Lab Work: None ordered   Testing/Procedures: None ordered   Follow-Up: At [facility], you and your health needs are our priority.  As part of our continuing mission to provide you with exceptional heart care, we have created designated Provider Care Teams.  These Care Teams include your primary Cardiologist (physician) and Advanced Practice Providers (APPs -  Physician Assistants and Nurse Practitioners) who all work together to provide you with the care you need, when you need it.  We recommend signing up for the patient portal called "MyChart".  Sign up information is provided on this After Visit Summary.  MyChart is used to connect with patients for Virtual Visits (Telemedicine).  Patients are able to view lab/test results, encounter notes, upcoming appointments, etc.  Non-urgent messages can be sent to your provider as well.   To learn more about what you can do with MyChart, go to [email or web address].    Your next appointment:  1 year    Call in Dec to schedule Feb appointment  ( New Office )    Provider:  Dr.Jordan

## 2023-07-27 NOTE — Addendum Note (Signed)
Addended by: Neoma Laming on: 07/27/2023 09:35 AM   Modules accepted: Orders

## 2023-09-08 ENCOUNTER — Ambulatory Visit (INDEPENDENT_AMBULATORY_CARE_PROVIDER_SITE_OTHER): Payer: Medicare Other | Admitting: Podiatry

## 2023-09-08 ENCOUNTER — Encounter: Payer: Self-pay | Admitting: Podiatry

## 2023-09-08 VITALS — Ht 71.0 in | Wt 183.0 lb

## 2023-09-08 DIAGNOSIS — B351 Tinea unguium: Secondary | ICD-10-CM

## 2023-09-08 DIAGNOSIS — E119 Type 2 diabetes mellitus without complications: Secondary | ICD-10-CM | POA: Diagnosis not present

## 2023-09-08 DIAGNOSIS — L84 Corns and callosities: Secondary | ICD-10-CM

## 2023-09-08 DIAGNOSIS — M79675 Pain in left toe(s): Secondary | ICD-10-CM

## 2023-09-08 DIAGNOSIS — M79674 Pain in right toe(s): Secondary | ICD-10-CM

## 2023-09-08 NOTE — Progress Notes (Signed)
  Subjective:  Patient ID: Marcus Beasley, male    DOB: February 14, 1946,  MRN: [id number]  Chief Complaint  Patient presents with   [facility]    He is here for diabetic nail trim and last A1C was 7.0    78 y.o. male returns with the above complaint. History confirmed with patient.  Overall doing well no new issues calluses and nails become thickened and painful again. Calluses painful as well.  Reports good blood sugar control.  No other new issues  Objective:  Physical Exam: warm, good capillary refill, no trophic changes or ulcerative lesions, normal DP and PT pulses and normal sensory exam. Onychomycosis is noted to all toenails with brown discoloration, thickening the nail plate and subungual debris.  He has a bunion on the left foot and hammertoes that are similar reducible in nature bilateral Left Foot:  L medial hallux pinch tyloma right Foot: medial hallux pinch tyloma, submetatarsal 5  Assessment:   1. Callus of foot   2. Pain due to onychomycosis of toenails of both feet   3. Type 2 diabetes mellitus without complication, without long-term current use of insulin (HCC)         Plan:  Patient was evaluated and treated and all questions answered.   Discussed the etiology and treatment options for the condition in detail with the patient. Educated patient on the topical and oral treatment options for mycotic nails. Recommended debridement of the nails today. Sharp and mechanical debridement performed of all painful and mycotic nails today. Nails debrided in length and thickness using a nail nipper and a mechanical burr to level of comfort. Discussed treatment options including appropriate shoe gear. Follow up as needed for painful nails.  All symptomatic hyperkeratoses were safely debrided with a sterile #15 blade to patient's level of comfort without incident. We discussed preventative and palliative care of these lesions including supportive and accommodative shoegear, padding,  prefabricated and custom molded accommodative orthoses, use of a pumice stone and lotions/creams daily.  Silicone toe pads were dispensed for digital corns    Return in about 4 months (around 01/08/2024) for at risk diabetic foot care.

## 2023-09-15 ENCOUNTER — Other Ambulatory Visit: Payer: Self-pay | Admitting: Internal Medicine

## 2023-09-20 ENCOUNTER — Other Ambulatory Visit: Payer: Self-pay | Admitting: Internal Medicine

## 2023-10-07 DIAGNOSIS — H40013 Open angle with borderline findings, low risk, bilateral: Secondary | ICD-10-CM | POA: Diagnosis not present

## 2023-10-07 DIAGNOSIS — H40033 Anatomical narrow angle, bilateral: Secondary | ICD-10-CM | POA: Diagnosis not present

## 2023-10-07 DIAGNOSIS — H35033 Hypertensive retinopathy, bilateral: Secondary | ICD-10-CM | POA: Diagnosis not present

## 2023-10-07 DIAGNOSIS — E119 Type 2 diabetes mellitus without complications: Secondary | ICD-10-CM | POA: Diagnosis not present

## 2023-10-07 DIAGNOSIS — H25813 Combined forms of age-related cataract, bilateral: Secondary | ICD-10-CM | POA: Diagnosis not present

## 2023-10-07 DIAGNOSIS — H353131 Nonexudative age-related macular degeneration, bilateral, early dry stage: Secondary | ICD-10-CM | POA: Diagnosis not present

## 2023-10-07 LAB — HM DIABETES EYE EXAM

## 2023-10-11 ENCOUNTER — Other Ambulatory Visit: Payer: Self-pay | Admitting: Cardiology

## 2023-10-22 ENCOUNTER — Other Ambulatory Visit: Payer: Self-pay | Admitting: Cardiology

## 2023-10-22 DIAGNOSIS — I480 Paroxysmal atrial fibrillation: Secondary | ICD-10-CM

## 2023-10-22 NOTE — Telephone Encounter (Signed)
 Prescription refill request for Eliquis  received. Indication: Afib  Last office visit: 07/27/23 ([place])  Scr: 1.06 (06/10/23)  Age: 78 Weight: 83kg  Appropriate dose. Refill sent.

## 2023-10-27 ENCOUNTER — Other Ambulatory Visit: Payer: Self-pay | Admitting: Cardiology

## 2023-11-02 ENCOUNTER — Encounter (HOSPITAL_COMMUNITY): Payer: Self-pay

## 2023-11-09 ENCOUNTER — Ambulatory Visit: Payer: Medicare Other | Admitting: Internal Medicine

## 2023-11-09 VITALS — BP 132/78 | HR 70 | Temp 98.0°F | Resp 18 | Ht 71.0 in | Wt 181.0 lb

## 2023-11-09 DIAGNOSIS — Z7984 Long term (current) use of oral hypoglycemic drugs: Secondary | ICD-10-CM | POA: Diagnosis not present

## 2023-11-09 DIAGNOSIS — I10 Essential (primary) hypertension: Secondary | ICD-10-CM | POA: Diagnosis not present

## 2023-11-09 DIAGNOSIS — E785 Hyperlipidemia, unspecified: Secondary | ICD-10-CM

## 2023-11-09 DIAGNOSIS — E1159 Type 2 diabetes mellitus with other circulatory complications: Secondary | ICD-10-CM | POA: Diagnosis not present

## 2023-11-09 DIAGNOSIS — I480 Paroxysmal atrial fibrillation: Secondary | ICD-10-CM | POA: Diagnosis not present

## 2023-11-09 LAB — LIPID PANEL
Cholesterol: 114 mg/dL (ref 0–200)
HDL: 44.1 mg/dL (ref >39.00)
LDL Cholesterol: 61 mg/dL (ref 0–99)
NonHDL: 70.24
Total CHOL/HDL Ratio: 3
Triglycerides: 45 mg/dL (ref 0.0–149.0)
VLDL: 9 mg/dL (ref 0.0–40.0)

## 2023-11-09 LAB — BASIC METABOLIC PANEL WITH GFR
BUN: 17 mg/dL (ref 6–23)
CO2: 26 meq/L (ref 19–32)
Calcium: 9 mg/dL (ref 8.4–10.5)
Chloride: 109 meq/L (ref 96–112)
Creatinine, Ser: 1.17 mg/dL (ref 0.40–1.50)
GFR: 59.41 mL/min — ABNORMAL LOW (ref >60.00)
Glucose, Bld: 123 mg/dL — ABNORMAL HIGH (ref 70–99)
Potassium: 4.5 meq/L (ref 3.5–5.1)
Sodium: 143 meq/L (ref 135–145)

## 2023-11-09 LAB — HEMOGLOBIN A1C: Hgb A1c MFr Bld: 6.8 % — ABNORMAL HIGH (ref 4.6–6.5)

## 2023-11-09 LAB — ALT: ALT: 24 U/L (ref 0–53)

## 2023-11-09 LAB — AST: AST: 24 U/L (ref 0–37)

## 2023-11-09 NOTE — Assessment & Plan Note (Signed)
 Atrial fibrillation.  Saw cardiology 07/27/2023.  No changes made. DM: Currently on metformin .  Checking labs.  His granddaughter check his CBGs, goals provided. HTN: On amlodipine , Cardizem , metoprolol .  BP today is okay, ambulatory BP goals provided.  Check BMP. High cholesterol: On Zetia  and Lipitor 80 mg, check FLP AST ALT. BPH: Has had a urology visit pending. RTC 4 months CPX

## 2023-11-09 NOTE — Progress Notes (Signed)
 Subjective:    Patient ID: Marcus Beasley, male    DOB: 19-Jun-1946, 78 y.o.   MRN: [id number]  DOS:  11/09/2023 Type of visit - description: Follow-up  Doing well.  No major concerns. Ambulatory BPs when  checked  are normal per patient. Has a history of A-fib, denies chest pain or difficulty breathing.  No palpitations. He is anticoagulated, denies blood in the stools or in the urine.  No nausea or vomiting. History of DM, CBGs are checked by his granddaughter, states  readings are "okay".   Review of Systems See above   Past Medical History:  Diagnosis Date   Atrial fibrillation (HCC)    BPH (benign prostatic hyperplasia)    (-) Bx 2015   Colon polyp    Diabetes mellitus without complication (HCC)    Elevated PSA    Prostate Bx in 06/2013 was benign   Glaucoma suspect    HTN (hypertension)    Hyperlipidemia    Macular degeneration, age related    Peptic ulcer    Stroke [facility])     Past Surgical History:  Procedure Laterality Date   ABCESS DRAINAGE     abdomen- 26 day hospitalization 1968   COLONOSCOPY  2016   HERNIA REPAIR  summer '11   umbilical, dr Lucienne Ryder    POLYPECTOMY     PROSTATE BIOPSY  06-2013 , 07-2017   (-), (-)    Current Outpatient Medications  Medication Instructions   Accu-Chek Softclix Lancets lancets Check blood sugars twice daily   amLODipine  (NORVASC ) 5 mg, Oral, Daily   atorvastatin  (LIPITOR) 80 mg, Oral, Daily   Blood Glucose Monitoring Suppl (ACCU-CHEK GUIDE) w/Device KIT Check blood sugars twice daily   diltiazem  (CARDIZEM  CD) 240 mg, Oral, Daily   Eliquis  5 mg, Oral, 2 times daily   ezetimibe  (ZETIA ) 10 mg, Oral, Daily   finasteride (PROSCAR) 5 mg, Daily   glucose blood (ACCU-CHEK GUIDE) test strip Check blood sugars twice daily   meclizine  (ANTIVERT ) 25 mg, Oral, 2 times daily PRN   metFORMIN  (GLUCOPHAGE ) 500 mg, Oral, Daily with breakfast   metoprolol  tartrate (LOPRESSOR ) 50 mg, Oral, 2 times daily   Multiple Vitamin (MULTIVITAMIN  WITH MINERALS) TABS tablet 1 tablet, Daily   Multiple Vitamins-Minerals (PRESERVISION/LUTEIN PO) 1 tablet, Daily   olopatadine (PATANOL) 0.1 % ophthalmic solution SMARTSIG:In Eye(s)   RESTASIS 0.05 % ophthalmic emulsion 1 drop, 2 times daily       Objective:   Physical Exam BP 132/78   Pulse 70   Temp 98 F (36.7 C)   Resp 18   Ht 5\' 11"  (1.803 m)   Wt 181 lb (82.1 kg)   SpO2 97%   BMI 25.24 kg/m  General:   Well developed, NAD, BMI noted. HEENT:  Normocephalic . Face symmetric, atraumatic Lungs:  CTA B Normal respiratory effort, no intercostal retractions, no accessory muscle use. Heart: Irregularly irregular Lower extremities: no pretibial edema bilaterally  Skin: Not pale. Not jaundice Neurologic:  alert & oriented X3.  Speech normal, gait assisted with a cane and consistent with previous stroke with left-sided hemiparesis.   Psych--  Cognition and judgment appear intact.  Cooperative with normal attention span and concentration.  Behavior appropriate. No anxious or depressed appearing.      Assessment    Problem list  DM  ---->  DX 07-2015, A1c 8.0; started metformin  Neuropathy, mild.  DX 08-2017 (in sensitive type) HTN   Hyperlipidemia UROLOGY: BPH, increased PSA. 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  Hep C serologies negative, hep B surface Ab+, hep B core antibody negative (consistent with vaccination)  PLAN Atrial fibrillation.  Saw cardiology 07/27/2023.  No changes made. DM: Currently on metformin .  Checking labs.  His granddaughter check his CBGs, goals provided. HTN: On amlodipine , Cardizem , metoprolol .  BP today is okay, ambulatory BP goals provided.  Check BMP. High cholesterol: On Zetia  and Lipitor 80 mg, check FLP AST ALT. BPH: Has had a urology visit pending. RTC 4 months CPX

## 2023-11-09 NOTE — Patient Instructions (Addendum)
 Diabetes: You can check your sugars at different times  - early in AM fasting  ( blood sugar goal 70-130) - 2 hours after a meal (blood sugar goal less than 180)     Check the  blood pressure regularly Blood pressure goal:  between 110/65 and  135/85. If it is consistently higher or lower, let me know     GO TO THE LAB : Get the blood work     Next office visit for a physical exam in 4 months Please make an appointment before you leave today Depending on your blood or XRs results it might be necessary to come back sooner

## 2023-11-11 ENCOUNTER — Ambulatory Visit: Payer: Self-pay | Admitting: Internal Medicine

## 2023-11-24 LAB — PSA: PSA: 3.54

## 2023-12-02 ENCOUNTER — Encounter: Payer: Self-pay | Admitting: Internal Medicine

## 2024-01-12 ENCOUNTER — Ambulatory Visit: Admitting: Podiatry

## 2024-01-12 VITALS — Ht 71.0 in | Wt 181.0 lb

## 2024-01-12 DIAGNOSIS — M79675 Pain in left toe(s): Secondary | ICD-10-CM

## 2024-01-12 DIAGNOSIS — B351 Tinea unguium: Secondary | ICD-10-CM

## 2024-01-12 DIAGNOSIS — E119 Type 2 diabetes mellitus without complications: Secondary | ICD-10-CM | POA: Diagnosis not present

## 2024-01-12 DIAGNOSIS — L84 Corns and callosities: Secondary | ICD-10-CM

## 2024-01-12 DIAGNOSIS — M79674 Pain in right toe(s): Secondary | ICD-10-CM | POA: Diagnosis not present

## 2024-01-12 NOTE — Progress Notes (Signed)
  Subjective:  Patient ID: Marcus Beasley, male    DOB: [birth year],  MRN: [id number]  Chief Complaint  Patient presents with   Nail Problem    Rm 1 Patient is here for diabetic foot care and nail trimming. Patient has no additional concerns today.    78 y.o. male returns with the above complaint. History confirmed with patient.  Overall doing well no new issues calluses and nails become thickened and painful again. Calluses painful as well.  Reports good blood sugar control.  No other new issues  Objective:  Physical Exam: warm, good capillary refill, no trophic changes or ulcerative lesions, normal DP and PT pulses and normal sensory exam. Onychomycosis is noted to all toenails with brown discoloration, thickening the nail plate and subungual debris.  He has a bunion on the left foot and hammertoes that are similar reducible in nature bilateral Left Foot:  L medial hallux pinch tyloma right Foot: medial hallux pinch tyloma, submetatarsal 5  Assessment:   1. Pain due to onychomycosis of toenails of both feet   2. Callus of foot   3. Type 2 diabetes mellitus without complication, without long-term current use of insulin  (HCC)         Plan:  Patient was evaluated and treated and all questions answered.   Discussed the etiology and treatment options for the condition in detail with the patient. Educated patient on the topical and oral treatment options for mycotic nails. Recommended debridement of the nails today. Sharp and mechanical debridement performed of all painful and mycotic nails today. Nails debrided in length and thickness using a nail nipper and a mechanical burr to level of comfort. Discussed treatment options including appropriate shoe gear. Follow up as needed for painful nails.  All symptomatic hyperkeratoses were safely debrided with a sterile #15 blade to patient's level of comfort without incident. We discussed preventative and palliative care of these lesions  including supportive and accommodative shoegear, padding, prefabricated and custom molded accommodative orthoses, use of a pumice stone and lotions/creams daily.  Some iatrogenic bleeding of the right hallux and submet 5 callus on right foot dressed with Neosporin and Band-Aid and post care instructions given   Return in about 4 months (around 05/14/2024) for at risk diabetic foot care.

## 2024-01-13 ENCOUNTER — Other Ambulatory Visit: Payer: Self-pay | Admitting: Internal Medicine

## 2024-02-04 ENCOUNTER — Other Ambulatory Visit: Payer: Self-pay | Admitting: Internal Medicine

## 2024-02-24 ENCOUNTER — Ambulatory Visit: Payer: Self-pay | Admitting: Internal Medicine

## 2024-02-24 ENCOUNTER — Encounter: Payer: Self-pay | Admitting: Internal Medicine

## 2024-02-24 ENCOUNTER — Ambulatory Visit: Admitting: Internal Medicine

## 2024-02-24 VITALS — BP 118/80 | HR 67 | Temp 98.1°F | Resp 16 | Ht 71.0 in | Wt 184.5 lb

## 2024-02-24 DIAGNOSIS — I10 Essential (primary) hypertension: Secondary | ICD-10-CM

## 2024-02-24 DIAGNOSIS — I480 Paroxysmal atrial fibrillation: Secondary | ICD-10-CM

## 2024-02-24 DIAGNOSIS — E1142 Type 2 diabetes mellitus with diabetic polyneuropathy: Secondary | ICD-10-CM | POA: Diagnosis not present

## 2024-02-24 DIAGNOSIS — Z23 Encounter for immunization: Secondary | ICD-10-CM

## 2024-02-24 DIAGNOSIS — E1159 Type 2 diabetes mellitus with other circulatory complications: Secondary | ICD-10-CM

## 2024-02-24 DIAGNOSIS — Z7984 Long term (current) use of oral hypoglycemic drugs: Secondary | ICD-10-CM

## 2024-02-24 DIAGNOSIS — I69354 Hemiplegia and hemiparesis following cerebral infarction affecting left non-dominant side: Secondary | ICD-10-CM | POA: Diagnosis not present

## 2024-02-24 DIAGNOSIS — Z0000 Encounter for general adult medical examination without abnormal findings: Secondary | ICD-10-CM | POA: Diagnosis not present

## 2024-02-24 DIAGNOSIS — G8110 Spastic hemiplegia affecting unspecified side: Secondary | ICD-10-CM

## 2024-02-24 DIAGNOSIS — Z0001 Encounter for general adult medical examination with abnormal findings: Secondary | ICD-10-CM

## 2024-02-24 LAB — BASIC METABOLIC PANEL WITH GFR
BUN: 18 mg/dL (ref 6–23)
CO2: 26 meq/L (ref 19–32)
Calcium: 9.3 mg/dL (ref 8.4–10.5)
Chloride: 107 meq/L (ref 96–112)
Creatinine, Ser: 1.2 mg/dL (ref 0.40–1.50)
GFR: 57.51 mL/min — ABNORMAL LOW (ref >60.00)
Glucose, Bld: 100 mg/dL — ABNORMAL HIGH (ref 70–99)
Potassium: 4.7 meq/L (ref 3.5–5.1)
Sodium: 140 meq/L (ref 135–145)

## 2024-02-24 LAB — CBC WITH DIFFERENTIAL/PLATELET
Basophils Absolute: 0 K/uL (ref 0.0–0.1)
Basophils Relative: 0.5 % (ref 0.0–3.0)
Eosinophils Absolute: 0.1 K/uL (ref 0.0–0.7)
Eosinophils Relative: 1.8 % (ref 0.0–5.0)
HCT: 41.1 % (ref 39.0–52.0)
Hemoglobin: 13.3 g/dL (ref 13.0–17.0)
Lymphocytes Relative: 26.8 % (ref 12.0–46.0)
Lymphs Abs: 1.6 K/uL (ref 0.7–4.0)
MCHC: 32.3 g/dL (ref 30.0–36.0)
MCV: 86.3 fl (ref 78.0–100.0)
Monocytes Absolute: 0.7 K/uL (ref 0.1–1.0)
Monocytes Relative: 11.1 % (ref 3.0–12.0)
Neutro Abs: 3.6 K/uL (ref 1.4–7.7)
Neutrophils Relative %: 59.8 % (ref 43.0–77.0)
Platelets: 272 K/uL (ref 150.0–400.0)
RBC: 4.77 Mil/uL (ref 4.22–5.81)
RDW: 14.8 % (ref 11.5–15.5)
WBC: 5.9 K/uL (ref 4.0–10.5)

## 2024-02-24 LAB — MICROALBUMIN / CREATININE URINE RATIO
Creatinine,U: 52.1 mg/dL
Microalb Creat Ratio: 16.6 mg/g (ref 0.0–30.0)
Microalb, Ur: 0.9 mg/dL (ref 0.0–1.9)

## 2024-02-24 LAB — HEMOGLOBIN A1C: Hgb A1c MFr Bld: 7 % — ABNORMAL HIGH (ref 4.6–6.5)

## 2024-02-24 NOTE — Assessment & Plan Note (Signed)
 Here for CPX - Td:  08-2017 - PNM 23:  2011, 2022;  Prevnar 2014. - zostavax 05-2015; s/p shingrex x2 -Flu shot and PNM 20: 02/24/2024 -Vaccines I recommend: RSV, COVID booster.   - CCS: Colonoscopy 06-2014, had polyps, cscope 10/2017, next is due. Saw GI 04/16/2023, to proceed with a colonoscopy, they will need to hold Eliquis  and get cardiology clearance. - Prostate cancer screening per urology.  -Labs: BMP CBC A1c micro

## 2024-02-24 NOTE — Progress Notes (Signed)
 Subjective:    Patient ID: Marcus Beasley, male    DOB: 11/13/1945, 78 y.o.   MRN: [id number]  DOS:  02/24/2024 Type of visit - description: CPX  CPX. Chronic medical problems addressed. Has a subtle left-sided weakness, managing with a cane.  Needs a parking permit.  No recent falls. He checks ambulatory BPs and CBGs regularly.   Review of Systems  Other than above, a 14 point review of systems is negative     Past Medical History:  Diagnosis Date   Atrial fibrillation (HCC)    BPH (benign prostatic hyperplasia)    (-) Bx 2015   Colon polyp    Diabetes mellitus without complication (HCC)    Elevated PSA    Prostate Bx in 06/2013 was benign   Glaucoma suspect    HTN (hypertension)    Hyperlipidemia    Macular degeneration, age related    Peptic ulcer    Stroke [facility])     Past Surgical History:  Procedure Laterality Date   ABCESS DRAINAGE     abdomen- 26 day hospitalization 1968   COLONOSCOPY  2016   HERNIA REPAIR  summer '11   umbilical, dr Vernetta    POLYPECTOMY     PROSTATE BIOPSY  06-2013 , 07-2017   (-), (-)   Social History   Socioeconomic History   Marital status: Married    Spouse name: Not on file   Number of children: 3   Years of education: 14   Highest education level: Not on file  Occupational History   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 [employer]    Married - '69. 2 dtrs , 1 son - homicide. 5 grandchildren.     Household: pt, wife   daughter Palma) visit almost daily   G-daughter helps w/ medication organization     Driving limited as off 0/7974   Social Drivers of Health   Financial Resource Strain:  Low Risk  (01/08/2022)   Overall Financial Resource Strain (CARDIA)    Difficulty of Paying Living Expenses: Not hard at all  Food Insecurity: No Food Insecurity (04/18/2022)   Hunger Vital Sign    Worried About Running Out of Food in the Last Year: Never true    Ran Out of Food in the Last Year: Never true  Transportation Needs: No Transportation Needs (04/18/2022)   PRAPARE - Administrator, Civil Service (Medical): No    Lack of Transportation (Non-Medical): No  Physical Activity: Sufficiently Active (01/08/2022)   Exercise Vital Sign    Days of Exercise per Week: 6 days    Minutes of Exercise per Session: 30 min  Stress: No Stress Concern Present (01/08/2022)   [employer] of Occupational Health - Occupational Stress Questionnaire    Feeling of Stress : Not at all  Social Connections: Moderately Integrated (01/08/2022)   Social Connection and Isolation Panel    Frequency of Communication with Friends and Family: More than three times a week    Frequency of Social Gatherings with Friends and Family: More than three times a week  Attends Religious Services: More than 4 times per year    Active Member of Clubs or Organizations: No    Attends Banker Meetings: Never    Marital Status: Married  Catering manager Violence: Not At Risk (01/08/2022)   Humiliation, Afraid, Rape, and Kick questionnaire    Fear of Current or Ex-Partner: No    Emotionally Abused: No    Physically Abused: No    Sexually Abused: No    Current Outpatient Medications  Medication Instructions   Accu-Chek Softclix Lancets lancets Check blood sugars twice daily   amLODipine  (NORVASC ) 5 mg, Oral, Daily   atorvastatin  (LIPITOR) 80 mg, Oral, Daily   Blood Glucose Monitoring Suppl (ACCU-CHEK GUIDE) w/Device KIT Check blood sugars twice daily   diltiazem  (CARDIZEM  CD) 240 mg, Oral, Daily   Eliquis  5 mg, Oral, 2 times daily   ezetimibe  (ZETIA ) 10 mg, Oral, Daily   finasteride (PROSCAR)  5 mg, Daily   glucose blood (ACCU-CHEK GUIDE) test strip Check blood sugars twice daily   meclizine  (ANTIVERT ) 25 mg, Oral, 2 times daily PRN   metFORMIN  (GLUCOPHAGE ) 500 mg, Oral, Daily with breakfast   metoprolol  tartrate (LOPRESSOR ) 50 mg, Oral, 2 times daily   Multiple Vitamin (MULTIVITAMIN WITH MINERALS) TABS tablet 1 tablet, Daily   Multiple Vitamins-Minerals (PRESERVISION/LUTEIN PO) 1 tablet, Daily   olopatadine (PATANOL) 0.1 % ophthalmic solution SMARTSIG:In Eye(s)   RESTASIS 0.05 % ophthalmic emulsion 1 drop, 2 times daily       Objective:   Physical Exam BP 118/80   Pulse 67   Temp 98.1 F (36.7 C) (Oral)   Resp 16   Ht 5' 11 (1.803 m)   Wt 184 lb 8 oz (83.7 kg)   SpO2 96%   BMI 25.73 kg/m  General: Well developed, NAD, BMI noted Neck: No  thyromegaly  HEENT:  Normocephalic . Face symmetric, atraumatic Lungs:  CTA B Normal respiratory effort, no intercostal retractions, no accessory muscle use. Heart: RRR,  no murmur.  Abdomen:  Not distended, soft, non-tender. No rebound or rigidity.   DM foot exam: No edema, pulses present, pinprick examination slightly decreased distally more so on the right Skin: Exposed areas without rash. Not pale. Not jaundice Neurologic:  alert & oriented X3.  Speech normal, gait assisted by cane.  Strength: Very subtle weakness,  LUE>LLE, left arm is slightly spastic. Psych: Cognition and judgment appear intact.  Cooperative with normal attention span and concentration.  Behavior appropriate. No anxious or depressed appearing.     Assessment     Problem list  DM  ---->  DX 07-2015, A1c 8.0; started metformin  Neuropathy, mild.  DX 08-2017 (in sensitive type) HTN   Hyperlipidemia UROLOGY: BPH, increased PSA. 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  Hep C serologies negative, hep B surface  Ab+, hep B core antibody negative (consistent with vaccination)  PLAN Here for CPX - Td:  08-2017 - PNM 23:  2011, 2022;  Prevnar 2014. - zostavax 05-2015; s/p shingrex x2 -Flu shot and PNM 20: 02/24/2024 -Vaccines I recommend: RSV, COVID booster.   - CCS: Colonoscopy 06-2014, had polyps, cscope 10/2017, next is due. Saw GI 04/16/2023, to proceed with a colonoscopy, they will need to hold Eliquis  and get cardiology clearance. - Prostate cancer screening per urology.  -Labs: BMP CBC A1c micro  Other issues: DM: Currently on metformin , reports ambulatory CBGs  are good but could not tell me any readings, his family checked them for him.  Will check a A1c.  Micro. DM neuropathy: See physical exam, practices good foot care. HTN: On amlodipine , Cardizem , metoprolol .  BP today satisfactory, reports ambulatory BPs at home are okay.  Check a BMP. Atrial fibrillation, paroxysmal: Anticoagulated, tolerates well.  No symptoms, check CBC. 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  Managing well with a cane, parking permit provided. BPH: Saw Urology 12/01/2023.  PSA noted to be stable.  On finasteride RTC 6 months.

## 2024-02-24 NOTE — Patient Instructions (Addendum)
 Vaccines I recommend: COVID booster this fall, RSV.  Diabetes: You can check your sugars at different times  - early in AM fasting  ( blood sugar goal 70-130) - 2 hours after a meal (blood sugar goal less than 180)   Check the  blood pressure regularly Blood pressure goal:  between 110/65 and  135/85. If it is consistently higher or lower, let me know  GO TO THE LAB :  Get the blood work   Your results will be posted on MyChart with my comments  Go to the front desk for the checkout Please make an appointment for follow-up in 6 months   Diabetes Mellitus and Foot Care Diabetes, also called diabetes mellitus, may cause problems with your feet and legs because of poor blood flow (circulation). Poor circulation may make your skin: Become thinner and drier. Break more easily. Heal more slowly. Peel and crack. You may also have nerve damage (neuropathy). This can cause decreased feeling in your legs and feet. This means that you may not notice minor injuries to your feet that could lead to more serious problems. Finding and treating problems early is the best way to prevent future foot problems. How to care for your feet Foot hygiene  Wash your feet daily with warm water and mild soap. Do not use hot water. Then, pat your feet and the areas between your toes until they are fully dry. Do not soak your feet. This can dry your skin. Trim your toenails straight across. Do not dig under them or around the cuticle. File the edges of your nails with an emery board or nail file. Apply a moisturizing lotion or petroleum jelly to the skin on your feet and to dry, brittle toenails. Use lotion that does not contain alcohol and is unscented. Do not apply lotion between your toes. Shoes and socks Wear clean socks or stockings every day. Make sure they are not too tight. Do not wear knee-high stockings. These may decrease blood flow to your legs. Wear shoes that fit well and have enough cushioning.  Always look in your shoes before you put them on to be sure there are no objects inside. To break in new shoes, wear them for just a few hours a day. This prevents injuries on your feet. Wounds, scrapes, corns, and calluses  Check your feet daily for blisters, cuts, bruises, sores, and redness. If you cannot see the bottom of your feet, use a mirror or ask someone for help. Do not cut off corns or calluses or try to remove them with medicine. If you find a minor scrape, cut, or break in the skin on your feet, keep it and the skin around it clean and dry. You may clean these areas with mild soap and water. Do not clean the area with peroxide, alcohol, or iodine. If you have a wound, scrape, corn, or callus on your foot, look at it several times a day to make sure it is healing and not infected. Check for: Redness, swelling, or pain. Fluid or blood. Warmth. Pus or a bad smell. General tips Do not cross your legs. This may decrease blood flow to your feet. Do not use heating pads or hot water bottles on your feet. They may burn your skin. If you have lost feeling in your feet or legs, you may not know this is happening until it is too late. Protect your feet from hot and cold by wearing shoes, such as at the beach or  on hot pavement. Schedule a complete foot exam at least once a year or more often if you have foot problems. Report any cuts, sores, or bruises to your health care provider right away. Where to find more information American Diabetes Association: diabetes.org Association of Diabetes Care & Education Specialists: diabeteseducator.org Contact a health care provider if: You have a condition that increases your risk of infection, and you have any cuts, sores, or bruises on your feet. You have an injury that is not healing. You have redness on your legs or feet. You feel burning or tingling in your legs or feet. You have pain or cramps in your legs and feet. Your legs or feet are  numb. Your feet always feel cold. You have pain around any toenails. Get help right away if: You have a wound, scrape, corn, or callus on your foot and: You have signs of infection. You have a fever. You have a red line going up your leg. This information is not intended to replace advice given to you by your health care provider. Make sure you discuss any questions you have with your health care provider. Document Revised: 12/11/2021 Document Reviewed: 12/11/2021 Elsevier Patient Education  2024 [employer].

## 2024-02-24 NOTE — Assessment & Plan Note (Signed)
 Here for CPX   Other issues: DM: Currently on metformin , reports ambulatory CBGs are good but could not tell me any readings, his family checked them for him.  Will check a A1c.  Micro. DM neuropathy: See physical exam, practices good foot care. HTN: On amlodipine , Cardizem , metoprolol .  BP today satisfactory, reports ambulatory BPs at home are okay.  Check a BMP. Atrial fibrillation, paroxysmal: Anticoagulated, tolerates well.  No symptoms, check CBC. 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  Managing well with a cane, parking permit provided. BPH: Saw Urology 12/01/2023.  PSA noted to be stable.  On finasteride RTC 6 months.

## 2024-03-18 ENCOUNTER — Other Ambulatory Visit: Payer: Self-pay | Admitting: Internal Medicine

## 2024-03-21 ENCOUNTER — Other Ambulatory Visit: Payer: Self-pay | Admitting: Internal Medicine

## 2024-04-14 ENCOUNTER — Ambulatory Visit (INDEPENDENT_AMBULATORY_CARE_PROVIDER_SITE_OTHER): Admitting: Podiatry

## 2024-04-14 VITALS — Ht 71.0 in | Wt 184.5 lb

## 2024-04-14 DIAGNOSIS — M79675 Pain in left toe(s): Secondary | ICD-10-CM | POA: Diagnosis not present

## 2024-04-14 DIAGNOSIS — B351 Tinea unguium: Secondary | ICD-10-CM

## 2024-04-14 DIAGNOSIS — L84 Corns and callosities: Secondary | ICD-10-CM

## 2024-04-14 DIAGNOSIS — E119 Type 2 diabetes mellitus without complications: Secondary | ICD-10-CM

## 2024-04-14 DIAGNOSIS — M79674 Pain in right toe(s): Secondary | ICD-10-CM

## 2024-04-14 NOTE — Progress Notes (Signed)
  Subjective:  Patient ID: Marcus Beasley, male    DOB: 04-23-1946,  MRN: [id number]  Chief Complaint  Patient presents with   Diabetes    RM 6 [facility]     78 y.o. male returns with the above complaint. History confirmed with patient.  Overall doing well no new issues calluses and nails become thickened and painful again. Calluses painful as well.  Reports good blood sugar control.  Reports hangnail type issue on the right hallux  Objective:  Physical Exam: warm, good capillary refill, no trophic changes or ulcerative lesions, normal DP and PT pulses and normal sensory exam. Onychomycosis is noted to all toenails with brown discoloration, thickening the nail plate and subungual debris.  He has a bunion on the left foot and hammertoes that are similar reducible in nature bilateral Left Foot:  L medial hallux pinch tyloma right Foot: medial hallux pinch tyloma, submetatarsal 5 callus.  Medial hallux nail no deep ingrown or infection  Assessment:   1. Pain due to onychomycosis of toenails of both feet   2. Callus of foot   3. Type 2 diabetes mellitus without complication, without long-term current use of insulin  (HCC)         Plan:  Patient was evaluated and treated and all questions answered.   Discussed the etiology and treatment options for the condition in detail with the patient. Educated patient on the topical and oral treatment options for mycotic nails. Recommended debridement of the nails today. Sharp and mechanical debridement performed of all painful and mycotic nails today. Nails debrided in length and thickness using a nail nipper and a mechanical burr to level of comfort. Discussed treatment options including appropriate shoe gear. Follow up as needed for painful nails.  Discussed if the ingrown nail on the right hallux becomes more painful to return as needed for avulsion  All symptomatic hyperkeratoses were safely debrided with a sterile #15 blade to patient's level of  comfort without incident. We discussed preventative and palliative care of these lesions including supportive and accommodative shoegear, padding, prefabricated and custom molded accommodative orthoses, use of a pumice stone and lotions/creams daily.    Return in about 3 months (around 07/15/2024) for at risk diabetic foot care.

## 2024-04-25 ENCOUNTER — Ambulatory Visit (INDEPENDENT_AMBULATORY_CARE_PROVIDER_SITE_OTHER): Admitting: *Deleted

## 2024-04-25 VITALS — Ht 71.0 in | Wt 184.0 lb

## 2024-04-25 DIAGNOSIS — Z0000 Encounter for general adult medical examination without abnormal findings: Secondary | ICD-10-CM

## 2024-04-25 NOTE — Patient Instructions (Addendum)
 Mr. Barthelemy,  Thank you for taking the time for your Medicare Wellness Visit. I appreciate your continued commitment to your health goals. Please review the care plan we discussed, and feel free to reach out if I can assist you further.  Please note that Annual Wellness Visits do not include a physical exam. Some assessments may be limited, especially if the visit was conducted virtually. If needed, we may recommend an in-person follow-up with your provider.  Ongoing Care Seeing your primary care provider every 3 to 6 months helps us  monitor your health and provide consistent, personalized care.   Dr Amon: 08/23/24 8am Annual Wellness Visit: 04/27/25 9:40am, telephone    Recommended Screenings:  Health Maintenance  Topic Date Due   Medicare Annual Wellness Visit  01/09/2023   Hemoglobin A1C  08/23/2024   Eye exam for diabetics  10/06/2024   Yearly kidney function blood test for diabetes  02/23/2025   Yearly kidney health urinalysis for diabetes  02/23/2025   Complete foot exam   02/23/2025   DTaP/Tdap/Td vaccine (3 - Tdap) 08/26/2027   Pneumococcal Vaccine for age over 71  Completed   Flu Shot  Completed   Hepatitis C Screening  Completed   Zoster (Shingles) Vaccine  Completed   Meningitis B Vaccine  Aged Out   Colon Cancer Screening  Discontinued   COVID-19 Vaccine  Discontinued   You will need to get the following vaccines at your local pharmacy: Covid      04/25/2024   10:35 AM  Advanced Directives  Does Patient Have a Medical Advance Directive? No  Would patient like information on creating a medical advance directive? Yes (MAU/Ambulatory/Procedural Areas - Information given)   Please let me know if you do not receive your Advanced Directive Packet within 1 week.  Once completed and notarized, you may return a copy of your Advanced Directive(s) by either of the following:  Bring a copy of your health care power of attorney and living will to the office to be added to your  chart at your convenience. You can mail a copy to [facility] [address] [place], [place] [address] or email to [email or web address]   Vision: Annual vision screenings are recommended for early detection of glaucoma, cataracts, and diabetic retinopathy. These exams can also reveal signs of chronic conditions such as diabetes and high blood pressure.  Dental: Annual dental screenings help detect early signs of oral cancer, gum disease, and other conditions linked to overall health, including heart disease and diabetes.  Please see the attached documents for additional preventive care recommendations.

## 2024-04-25 NOTE — Progress Notes (Addendum)
 Please attest this visit in the absence of patient primary care provider.    I connected with  Marcus Beasley on 05/04/24 by a audio enabled telemedicine application and verified that I am speaking with the correct person using two identifiers.  Patient Location: Home  Provider Location: Office/Clinic  I discussed the limitations of evaluation and management by telemedicine. The patient expressed understanding and agreed to proceed.   Subjective:   Marcus Beasley is a 78 y.o. male who presents for a Medicare Annual Wellness Visit.  Allergies (verified) Patient has no known allergies.   History: Past Medical History:  Diagnosis Date   Atrial fibrillation (HCC)    BPH (benign prostatic hyperplasia)    (-) Bx 2015   Colon polyp    Diabetes mellitus without complication (HCC)    Elevated PSA    Prostate Bx in 06/2013 was benign   Glaucoma suspect    HTN (hypertension)    Hyperlipidemia    Macular degeneration, age related    Peptic ulcer    Stroke [facility])    Past Surgical History:  Procedure Laterality Date   ABCESS DRAINAGE     abdomen- 26 day hospitalization 1968   COLONOSCOPY  2016   HERNIA REPAIR  summer '11   umbilical, dr Vernetta    POLYPECTOMY     PROSTATE BIOPSY  06-2013 , 07-2017   (-), (-)   Family History  Problem Relation Age of Onset   Leukemia Mother    Heart attack Father        MI age 18   Heart disease Sister        age 65   Cancer - Other Sister        type   Diabetes Sister    Diabetes Sister    Heart disease Brother        age 66   Kidney disease Brother        HD   Diabetes Maternal Grandmother    Diabetes Daughter    Prostate cancer Neg Hx    Colon cancer Neg Hx    Esophageal cancer Neg Hx    Rectal cancer Neg Hx    Stomach cancer Neg Hx    Social History   Occupational History   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     Employer: [employer]  Tobacco Use   Smoking status: Never   Smokeless tobacco: Never   Vaping Use   Vaping status: Never Used  Substance and Sexual Activity   Alcohol use: Not Currently   Drug use: No   Sexual activity: Yes    Partners: Female   Tobacco Counseling Counseling given: Not Answered  SDOH Screenings   Food Insecurity: No Food Insecurity (04/25/2024)  Housing: Low Risk  (04/25/2024)  Transportation Needs: No Transportation Needs (04/25/2024)  Utilities: Not At Risk (04/25/2024)  Alcohol Screen: Low Risk  (01/08/2022)  Depression (PHQ2-9): Low Risk  (04/25/2024)  Financial Resource Strain: Low Risk  (01/08/2022)  Physical Activity: Sufficiently Active (04/25/2024)  Social Connections: Moderately Integrated (04/25/2024)  Stress: No Stress Concern Present (04/25/2024)  Tobacco Use: Low Risk  (04/25/2024)  Health Literacy: Adequate Health Literacy (04/25/2024)   Depression Screen    04/25/2024   10:55 AM 02/24/2024    8:31 AM 11/11/2023   10:53 AM 06/10/2023    8:20 AM 12/09/2022    7:53 AM 08/05/2022    8:32 AM 03/31/2022    8:38 AM  PHQ 2/9 Scores  PHQ - 2  Score 0 0 0 0 0 0 0  PHQ- 9 Score 0  0           Data saved with a previous flowsheet row definition     Goals Addressed   None    Visit info / Clinical Intake: Medicare Wellness Visit Type:: Subsequent Annual Wellness Visit Persons participating in visit:: patient Medicare Wellness Visit Mode:: Telephone If telephone:: video declined Because this visit was a virtual/telehealth visit:: vitals recorded from last visit Information given by:: patient Interpreter Needed?: No Pre-visit prep was completed: yes AWV questionnaire completed by patient prior to visit?: no Living arrangements:: lives with spouse/significant other Patient's Overall Health Status Rating: good Typical amount of pain: none (has stiffness) Does pain affect daily life?: no Are you currently prescribed opioids?: no  Dietary Habits and Nutritional Risks How many meals a day?: 3 (has a couple snacks between meals) Eats fruit and  vegetables daily?: (!) no Most meals are obtained by: preparing own meals; eating out In the last 2 weeks, have you had any of the following?: none Diabetic:: (!) yes Any non-healing wounds?: no How often do you check your BS?: 1 Would you like to be referred to a Nutritionist or for Diabetic Management? : no  Functional Status Activities of Daily Living (to include ambulation/medication): Independent Ambulation: Independent Medication Administration: Independent Home Management: Independent Manage your own finances?: yes Primary transportation is: family/friends (pt drives short distances) Concerns about vision?: no *vision screening is required for WTM* Concerns about hearing?: no  Fall Screening Falls in the past year?: 0 Number of falls in past year: 0 Was there an injury with Fall?: 0 Fall Risk Category Calculator: 0 Patient Fall Risk Level: Low Fall Risk  Fall Risk Patient at Risk for Falls Due to: Impaired balance/gait Fall risk Follow up: Falls evaluation completed  Home and Transportation Safety: All rugs have non-skid backing?: yes All stairs or steps have railings?: yes Grab bars in the bathtub or shower?: yes Have non-skid surface in bathtub or shower?: yes Good home lighting?: yes Regular seat belt use?: yes Hospital stays in the last year:: no  Cognitive Assessment Difficulty concentrating, remembering, or making decisions? : no Will 6CIT or Mini Cog be Completed: yes What year is it?: 0 points What month is it?: 0 points Give patient an address phrase to remember (5 components): [address] [place]  About what time is it?: 0 points Count backwards from 20 to 1: 0 points Say the months of the year in reverse: 0 points Repeat the address phrase from earlier: 4 points 6 CIT Score: 4 points  Advance Directives (For Healthcare) Does Patient Have a Medical Advance Directive?: No Would patient like information on creating a medical advance  directive?: Yes (MAU/Ambulatory/Procedural Areas - Information given)  Reviewed/Updated  Reviewed/Updated: All        Objective:    Today's Vitals   04/25/24 1033  Weight: 184 lb (83.5 kg)  Height: 5' 11 (1.803 m)   Body mass index is 25.66 kg/m.  Current Medications (verified) Outpatient Encounter Medications as of 04/25/2024  Medication Sig   Accu-Chek Softclix Lancets lancets Check blood sugars twice daily   amLODipine  (NORVASC ) 5 MG tablet Take 1 tablet (5 mg total) by mouth daily.   atorvastatin  (LIPITOR) 80 MG tablet TAKE 1 TABLET BY MOUTH EVERY DAY   Blood Glucose Monitoring Suppl (ACCU-CHEK GUIDE) w/Device KIT Check blood sugars twice daily   diltiazem  (CARDIZEM  CD) 240 MG 24 hr capsule Take  1 capsule (240 mg total) by mouth daily.   ezetimibe  (ZETIA ) 10 MG tablet Take 1 tablet (10 mg total) by mouth daily.   finasteride (PROSCAR) 5 MG tablet Take 5 mg by mouth daily.   glucose blood (ACCU-CHEK GUIDE) test strip Check blood sugars twice daily   meclizine  (ANTIVERT ) 25 MG tablet Take 1 tablet (25 mg total) by mouth 2 (two) times daily as needed for dizziness.   metFORMIN  (GLUCOPHAGE ) 500 MG tablet Take 1 tablet (500 mg total) by mouth daily with breakfast.   metoprolol  tartrate (LOPRESSOR ) 50 MG tablet TAKE 1 TABLET BY MOUTH TWICE A DAY   Multiple Vitamin (MULTIVITAMIN WITH MINERALS) TABS tablet Take 1 tablet by mouth daily.    Multiple Vitamins-Minerals (PRESERVISION/LUTEIN PO) Take 1 tablet by mouth daily.    olopatadine (PATANOL) 0.1 % ophthalmic solution SMARTSIG:In Eye(s)   RESTASIS 0.05 % ophthalmic emulsion 1 drop 2 (two) times daily.   [DISCONTINUED] ELIQUIS  5 MG TABS tablet TAKE 1 TABLET BY MOUTH TWICE A DAY   No facility-administered encounter medications on file as of 04/25/2024.   Hearing/Vision screen Hearing Screening - Comments:: Denies hearing difficulties.  Vision Screening - Comments:: Up to date with routine eye exams with [facility] Immunizations and Health Maintenance Health Maintenance  Topic Date Due   HEMOGLOBIN A1C  08/23/2024   OPHTHALMOLOGY EXAM  10/06/2024   Diabetic kidney evaluation - eGFR measurement  02/23/2025   Diabetic kidney evaluation - Urine ACR  02/23/2025   FOOT EXAM  02/23/2025   Medicare Annual Wellness (AWV)  04/25/2025   DTaP/Tdap/Td (3 - Tdap) 08/26/2027   Pneumococcal Vaccine: 50+ Years  Completed   Influenza Vaccine  Completed   Hepatitis C Screening  Completed   Zoster Vaccines- Shingrix   Completed   Meningococcal B Vaccine  Aged Out   Colonoscopy  Discontinued   COVID-19 Vaccine  Discontinued        Assessment/Plan:  This is a routine wellness examination for [employer].  Patient Care Team: Amon Aloysius BRAVO, MD as PCP - General (Internal Medicine) Jordan, Peter M, MD as PCP - Cardiology (Cardiology) Pyrtle, Gordy HERO, MD as Consulting Physician (Gastroenterology) Cam Morene ORN, MD as Attending Physician (Urology) Ivin Kocher, MD as Consulting Physician (Dermatology) Manford Elspeth ORN, MD as Referring Physician (Optometry) Octavia Bruckner, MD as Consulting Physician (Ophthalmology)  I have personally reviewed and noted the following in the patient's chart:   Medical and social history Use of alcohol, tobacco or illicit drugs  Current medications and supplements including opioid prescriptions. Functional ability and status Nutritional status Physical activity Advanced directives List of other physicians Hospitalizations, surgeries, and ER visits in previous 12 months Vitals Screenings to include cognitive, depression, and falls Referrals and appointments  No orders of the defined types were placed in this encounter.  In addition, I have reviewed and discussed with patient certain preventive protocols, quality metrics, and best practice recommendations. A written personalized care plan for preventive services as well as general preventive health  recommendations were provided to patient.   Lolita Libra, CMA   05/04/2024   Return in 1 year (on 04/25/2025).  After Visit Summary: (MyChart) Due to this being a telephonic visit, the after visit summary with patients personalized plan was offered to patient via MyChart   Nurse Notes: nothing significant to report

## 2024-04-29 ENCOUNTER — Other Ambulatory Visit: Payer: Self-pay | Admitting: Cardiology

## 2024-04-29 DIAGNOSIS — I480 Paroxysmal atrial fibrillation: Secondary | ICD-10-CM

## 2024-04-29 MED ORDER — APIXABAN 5 MG PO TABS: 5.0000 mg | ORAL_TABLET | Freq: Two times a day (BID) | ORAL | 1 refills | Status: AC

## 2024-04-29 NOTE — Telephone Encounter (Signed)
 Prescription refill request for Eliquis  received. Indication: A. FIB Last office visit: 07/27/23 Scr: 1.2 (EPIC 02/24/24) Age: 78 Weight: 83.5 kg   Per protocol patient is okay to receive refills on current dose.

## 2024-06-21 ENCOUNTER — Other Ambulatory Visit: Payer: Self-pay | Admitting: Internal Medicine

## 2024-07-13 ENCOUNTER — Other Ambulatory Visit: Payer: Self-pay | Admitting: Internal Medicine

## 2024-07-19 ENCOUNTER — Ambulatory Visit: Admitting: Podiatry

## 2024-08-02 ENCOUNTER — Ambulatory Visit: Admitting: Podiatry

## 2024-08-12 ENCOUNTER — Ambulatory Visit: Admitting: Cardiology

## 2024-08-23 ENCOUNTER — Ambulatory Visit: Admitting: Internal Medicine

## 2025-04-27 ENCOUNTER — Ambulatory Visit
